# Patient Record
Sex: Female | Born: 1955 | Race: White | Hispanic: No | Marital: Married | Smoking: Current every day smoker
Health system: Southern US, Community
[De-identification: ages and names within clinical notes are randomized; demographics above are authoritative.]

## PROBLEM LIST (undated history)

## (undated) DIAGNOSIS — M719 Bursopathy, unspecified: Secondary | ICD-10-CM

## (undated) DIAGNOSIS — J439 Emphysema, unspecified: Secondary | ICD-10-CM

## (undated) DIAGNOSIS — I499 Cardiac arrhythmia, unspecified: Secondary | ICD-10-CM

## (undated) DIAGNOSIS — E669 Obesity, unspecified: Secondary | ICD-10-CM

## (undated) DIAGNOSIS — O009 Unspecified ectopic pregnancy without intrauterine pregnancy: Secondary | ICD-10-CM

## (undated) DIAGNOSIS — E0590 Thyrotoxicosis, unspecified without thyrotoxic crisis or storm: Secondary | ICD-10-CM

## (undated) DIAGNOSIS — K5732 Diverticulitis of large intestine without perforation or abscess without bleeding: Principal | ICD-10-CM

## (undated) DIAGNOSIS — E785 Hyperlipidemia, unspecified: Secondary | ICD-10-CM

## (undated) DIAGNOSIS — J449 Chronic obstructive pulmonary disease, unspecified: Secondary | ICD-10-CM

## (undated) DIAGNOSIS — M7500 Adhesive capsulitis of unspecified shoulder: Secondary | ICD-10-CM

## (undated) DIAGNOSIS — N979 Female infertility, unspecified: Secondary | ICD-10-CM

## (undated) DIAGNOSIS — M1990 Unspecified osteoarthritis, unspecified site: Secondary | ICD-10-CM

## (undated) DIAGNOSIS — E079 Disorder of thyroid, unspecified: Secondary | ICD-10-CM

## (undated) DIAGNOSIS — G4730 Sleep apnea, unspecified: Secondary | ICD-10-CM

## (undated) DIAGNOSIS — J189 Pneumonia, unspecified organism: Secondary | ICD-10-CM

## (undated) DIAGNOSIS — K602 Anal fissure, unspecified: Secondary | ICD-10-CM

## (undated) DIAGNOSIS — R7303 Prediabetes: Secondary | ICD-10-CM

## (undated) DIAGNOSIS — R1310 Dysphagia, unspecified: Secondary | ICD-10-CM

## (undated) DIAGNOSIS — K469 Unspecified abdominal hernia without obstruction or gangrene: Secondary | ICD-10-CM

## (undated) DIAGNOSIS — M2550 Pain in unspecified joint: Secondary | ICD-10-CM

## (undated) DIAGNOSIS — K5900 Constipation, unspecified: Secondary | ICD-10-CM

## (undated) DIAGNOSIS — E119 Type 2 diabetes mellitus without complications: Secondary | ICD-10-CM

## (undated) DIAGNOSIS — R011 Cardiac murmur, unspecified: Secondary | ICD-10-CM

## (undated) HISTORY — DX: Dysphagia, unspecified: R13.10

## (undated) HISTORY — DX: Adhesive capsulitis of unspecified shoulder: M75.00

## (undated) HISTORY — DX: Anal fissure, unspecified: K60.2

## (undated) HISTORY — DX: Unspecified osteoarthritis, unspecified site: M19.90

## (undated) HISTORY — DX: Type 2 diabetes mellitus without complications: E11.9

## (undated) HISTORY — DX: Sleep apnea, unspecified: G47.30

## (undated) HISTORY — DX: Emphysema, unspecified: J43.9

## (undated) HISTORY — DX: Constipation, unspecified: K59.00

## (undated) HISTORY — PX: ECTOPIC PREGNANCY SURGERY: SHX613

## (undated) HISTORY — DX: Unspecified abdominal hernia without obstruction or gangrene: K46.9

## (undated) HISTORY — PX: APPENDECTOMY: SHX54

## (undated) HISTORY — DX: Hyperlipidemia, unspecified: E78.5

## (undated) HISTORY — DX: Obesity, unspecified: E66.9

## (undated) HISTORY — DX: Female infertility, unspecified: N97.9

## (undated) HISTORY — DX: Cardiac murmur, unspecified: R01.1

## (undated) HISTORY — DX: Pain in unspecified joint: M25.50

## (undated) HISTORY — DX: Diverticulitis of large intestine without perforation or abscess without bleeding: K57.32

## (undated) HISTORY — DX: Chronic obstructive pulmonary disease, unspecified: J44.9

## (undated) HISTORY — PX: DILATION AND CURETTAGE OF UTERUS: SHX78

---
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
## 1978-10-25 HISTORY — PX: TONSILLECTOMY: SHX5217

## 1998-06-04 ENCOUNTER — Inpatient Hospital Stay (HOSPITAL_COMMUNITY): Admission: AD | Admit: 1998-06-04 | Discharge: 1998-06-04 | Payer: Self-pay | Admitting: Gynecology

## 1998-07-15 ENCOUNTER — Other Ambulatory Visit: Admission: RE | Admit: 1998-07-15 | Discharge: 1998-07-15 | Payer: Self-pay | Admitting: Gynecology

## 1998-07-27 ENCOUNTER — Ambulatory Visit (HOSPITAL_COMMUNITY): Admission: RE | Admit: 1998-07-27 | Discharge: 1998-07-27 | Payer: Self-pay | Admitting: Gynecology

## 1998-07-30 ENCOUNTER — Inpatient Hospital Stay (HOSPITAL_COMMUNITY): Admission: AD | Admit: 1998-07-30 | Discharge: 1998-07-30 | Payer: Self-pay | Admitting: Gynecology

## 1998-09-24 ENCOUNTER — Inpatient Hospital Stay (HOSPITAL_COMMUNITY): Admission: AD | Admit: 1998-09-24 | Discharge: 1998-09-24 | Payer: Self-pay | Admitting: Gynecology

## 1998-11-14 ENCOUNTER — Other Ambulatory Visit: Admission: RE | Admit: 1998-11-14 | Discharge: 1998-11-14 | Payer: Self-pay | Admitting: Gynecology

## 1998-12-17 ENCOUNTER — Inpatient Hospital Stay (HOSPITAL_COMMUNITY): Admission: AD | Admit: 1998-12-17 | Discharge: 1998-12-17 | Payer: Self-pay | Admitting: Gynecology

## 1999-02-10 ENCOUNTER — Inpatient Hospital Stay (HOSPITAL_COMMUNITY): Admission: AD | Admit: 1999-02-10 | Discharge: 1999-02-10 | Payer: Self-pay | Admitting: Gynecology

## 1999-04-05 ENCOUNTER — Inpatient Hospital Stay (HOSPITAL_COMMUNITY): Admission: AD | Admit: 1999-04-05 | Discharge: 1999-04-05 | Payer: Self-pay | Admitting: Gynecology

## 1999-08-14 ENCOUNTER — Encounter (INDEPENDENT_AMBULATORY_CARE_PROVIDER_SITE_OTHER): Payer: Self-pay | Admitting: Specialist

## 1999-08-14 ENCOUNTER — Other Ambulatory Visit: Admission: RE | Admit: 1999-08-14 | Discharge: 1999-08-14 | Payer: Self-pay | Admitting: Gynecology

## 1999-09-18 ENCOUNTER — Encounter (HOSPITAL_COMMUNITY): Admission: AD | Admit: 1999-09-18 | Discharge: 1999-12-17 | Payer: Self-pay | Admitting: Gynecology

## 1999-12-21 ENCOUNTER — Encounter (HOSPITAL_COMMUNITY): Admission: AD | Admit: 1999-12-21 | Discharge: 2000-03-20 | Payer: Self-pay | Admitting: Gynecology

## 2000-01-21 ENCOUNTER — Ambulatory Visit (HOSPITAL_COMMUNITY): Admission: RE | Admit: 2000-01-21 | Discharge: 2000-01-21 | Payer: Self-pay | Admitting: Gynecology

## 2000-01-21 ENCOUNTER — Encounter (INDEPENDENT_AMBULATORY_CARE_PROVIDER_SITE_OTHER): Payer: Self-pay | Admitting: Specialist

## 2000-07-04 ENCOUNTER — Encounter: Payer: Self-pay | Admitting: Family Medicine

## 2000-07-04 ENCOUNTER — Encounter: Admission: RE | Admit: 2000-07-04 | Discharge: 2000-07-04 | Payer: Self-pay | Admitting: Family Medicine

## 2001-07-07 ENCOUNTER — Encounter: Payer: Self-pay | Admitting: Gynecology

## 2001-07-07 ENCOUNTER — Encounter: Admission: RE | Admit: 2001-07-07 | Discharge: 2001-07-07 | Payer: Self-pay | Admitting: Gynecology

## 2002-04-11 ENCOUNTER — Other Ambulatory Visit: Admission: RE | Admit: 2002-04-11 | Discharge: 2002-04-11 | Payer: Self-pay | Admitting: Family Medicine

## 2002-06-28 ENCOUNTER — Encounter: Payer: Self-pay | Admitting: Gynecology

## 2002-06-28 ENCOUNTER — Encounter: Admission: RE | Admit: 2002-06-28 | Discharge: 2002-06-28 | Payer: Self-pay | Admitting: Gynecology

## 2002-10-10 ENCOUNTER — Inpatient Hospital Stay (HOSPITAL_COMMUNITY): Admission: EM | Admit: 2002-10-10 | Discharge: 2002-10-13 | Payer: Self-pay | Admitting: Emergency Medicine

## 2002-10-10 ENCOUNTER — Encounter (INDEPENDENT_AMBULATORY_CARE_PROVIDER_SITE_OTHER): Payer: Self-pay | Admitting: Specialist

## 2002-10-10 ENCOUNTER — Encounter: Payer: Self-pay | Admitting: Surgery

## 2003-08-09 ENCOUNTER — Encounter: Payer: Self-pay | Admitting: Gynecology

## 2003-08-09 ENCOUNTER — Encounter: Admission: RE | Admit: 2003-08-09 | Discharge: 2003-08-09 | Payer: Self-pay | Admitting: Gynecology

## 2003-10-22 ENCOUNTER — Other Ambulatory Visit: Admission: RE | Admit: 2003-10-22 | Discharge: 2003-10-22 | Payer: Self-pay | Admitting: Gynecology

## 2003-11-13 ENCOUNTER — Encounter: Admission: RE | Admit: 2003-11-13 | Discharge: 2003-11-13 | Payer: Self-pay | Admitting: Orthopedic Surgery

## 2004-08-21 ENCOUNTER — Ambulatory Visit (HOSPITAL_BASED_OUTPATIENT_CLINIC_OR_DEPARTMENT_OTHER): Admission: RE | Admit: 2004-08-21 | Discharge: 2004-08-21 | Payer: Self-pay | Admitting: Pulmonary Disease

## 2004-08-21 ENCOUNTER — Ambulatory Visit: Payer: Self-pay | Admitting: Pulmonary Disease

## 2004-09-11 ENCOUNTER — Ambulatory Visit: Payer: Self-pay | Admitting: Pulmonary Disease

## 2004-11-11 ENCOUNTER — Encounter: Admission: RE | Admit: 2004-11-11 | Discharge: 2004-11-11 | Payer: Self-pay | Admitting: Gynecology

## 2005-03-11 ENCOUNTER — Ambulatory Visit: Payer: Self-pay | Admitting: Pulmonary Disease

## 2005-10-15 ENCOUNTER — Ambulatory Visit: Payer: Self-pay | Admitting: Family Medicine

## 2005-11-02 ENCOUNTER — Ambulatory Visit: Payer: Self-pay | Admitting: Family Medicine

## 2005-11-09 ENCOUNTER — Ambulatory Visit: Payer: Self-pay | Admitting: Family Medicine

## 2005-12-03 ENCOUNTER — Encounter: Admission: RE | Admit: 2005-12-03 | Discharge: 2005-12-03 | Payer: Self-pay | Admitting: Family Medicine

## 2005-12-10 ENCOUNTER — Ambulatory Visit: Payer: Self-pay | Admitting: Gastroenterology

## 2006-01-07 ENCOUNTER — Ambulatory Visit: Payer: Self-pay | Admitting: Family Medicine

## 2006-01-17 ENCOUNTER — Ambulatory Visit: Payer: Self-pay | Admitting: Family Medicine

## 2006-12-15 ENCOUNTER — Ambulatory Visit: Payer: Self-pay | Admitting: Family Medicine

## 2007-09-04 ENCOUNTER — Ambulatory Visit: Payer: Self-pay | Admitting: Family Medicine

## 2007-09-04 DIAGNOSIS — M1990 Unspecified osteoarthritis, unspecified site: Secondary | ICD-10-CM | POA: Insufficient documentation

## 2007-09-04 DIAGNOSIS — R51 Headache: Secondary | ICD-10-CM | POA: Insufficient documentation

## 2007-09-04 DIAGNOSIS — J309 Allergic rhinitis, unspecified: Secondary | ICD-10-CM | POA: Insufficient documentation

## 2007-09-04 DIAGNOSIS — E785 Hyperlipidemia, unspecified: Secondary | ICD-10-CM | POA: Insufficient documentation

## 2007-09-04 DIAGNOSIS — J209 Acute bronchitis, unspecified: Secondary | ICD-10-CM | POA: Insufficient documentation

## 2007-09-04 DIAGNOSIS — K219 Gastro-esophageal reflux disease without esophagitis: Secondary | ICD-10-CM | POA: Insufficient documentation

## 2007-09-04 DIAGNOSIS — J342 Deviated nasal septum: Secondary | ICD-10-CM | POA: Insufficient documentation

## 2007-09-04 DIAGNOSIS — R519 Headache, unspecified: Secondary | ICD-10-CM | POA: Insufficient documentation

## 2007-09-05 ENCOUNTER — Telehealth: Payer: Self-pay | Admitting: Family Medicine

## 2007-09-20 ENCOUNTER — Telehealth: Payer: Self-pay | Admitting: Internal Medicine

## 2008-03-15 ENCOUNTER — Telehealth: Payer: Self-pay | Admitting: Family Medicine

## 2008-04-24 ENCOUNTER — Ambulatory Visit: Payer: Self-pay | Admitting: Family Medicine

## 2008-04-24 LAB — CONVERTED CEMR LAB
Blood in Urine, dipstick: NEGATIVE
Nitrite: NEGATIVE
Specific Gravity, Urine: 1.025
Urobilinogen, UA: 0.2
pH: 5

## 2008-04-25 ENCOUNTER — Telehealth: Payer: Self-pay | Admitting: Family Medicine

## 2008-04-25 LAB — CONVERTED CEMR LAB
AST: 28 units/L (ref 0–37)
Albumin: 3.7 g/dL (ref 3.5–5.2)
Alkaline Phosphatase: 57 units/L (ref 39–117)
Basophils Absolute: 0 10*3/uL (ref 0.0–0.1)
Bilirubin, Direct: 0.1 mg/dL (ref 0.0–0.3)
Chloride: 107 meq/L (ref 96–112)
Cholesterol: 191 mg/dL (ref 0–200)
Eosinophils Absolute: 0.2 10*3/uL (ref 0.0–0.7)
Eosinophils Relative: 3 % (ref 0.0–5.0)
GFR calc Af Amer: 114 mL/min
GFR calc non Af Amer: 94 mL/min
HCT: 37.6 % (ref 36.0–46.0)
HDL: 26 mg/dL — ABNORMAL LOW (ref >39.0)
MCHC: 34.7 g/dL (ref 30.0–36.0)
MCV: 93 fL (ref 78.0–100.0)
Monocytes Absolute: 0.6 10*3/uL (ref 0.1–1.0)
Neutrophils Relative %: 56.4 % (ref 43.0–77.0)
Platelets: 347 10*3/uL (ref 150–400)
Potassium: 3.9 meq/L (ref 3.5–5.1)
RDW: 14.3 % (ref 11.5–14.6)
Sodium: 141 meq/L (ref 135–145)
Total Bilirubin: 0.7 mg/dL (ref 0.3–1.2)
Triglycerides: 257 mg/dL (ref 0–149)
WBC: 8 10*3/uL (ref 4.5–10.5)

## 2008-05-01 ENCOUNTER — Ambulatory Visit: Payer: Self-pay | Admitting: Family Medicine

## 2008-05-02 ENCOUNTER — Encounter: Payer: Self-pay | Admitting: Family Medicine

## 2008-05-15 ENCOUNTER — Other Ambulatory Visit: Admission: RE | Admit: 2008-05-15 | Discharge: 2008-05-15 | Payer: Self-pay | Admitting: Gynecology

## 2008-06-12 ENCOUNTER — Ambulatory Visit: Payer: Self-pay | Admitting: Family Medicine

## 2008-06-12 DIAGNOSIS — J45909 Unspecified asthma, uncomplicated: Secondary | ICD-10-CM | POA: Insufficient documentation

## 2008-12-06 ENCOUNTER — Ambulatory Visit: Payer: Self-pay | Admitting: Family Medicine

## 2008-12-06 ENCOUNTER — Encounter: Admission: RE | Admit: 2008-12-06 | Discharge: 2008-12-06 | Payer: Self-pay | Admitting: Gynecology

## 2008-12-06 DIAGNOSIS — J019 Acute sinusitis, unspecified: Secondary | ICD-10-CM | POA: Insufficient documentation

## 2008-12-24 ENCOUNTER — Telehealth: Payer: Self-pay | Admitting: Family Medicine

## 2009-02-04 ENCOUNTER — Telehealth: Payer: Self-pay | Admitting: Family Medicine

## 2009-02-17 ENCOUNTER — Ambulatory Visit: Payer: Self-pay | Admitting: Family Medicine

## 2009-02-17 DIAGNOSIS — M722 Plantar fascial fibromatosis: Secondary | ICD-10-CM | POA: Insufficient documentation

## 2009-02-17 DIAGNOSIS — I7300 Raynaud's syndrome without gangrene: Secondary | ICD-10-CM | POA: Insufficient documentation

## 2009-09-26 ENCOUNTER — Ambulatory Visit: Payer: Self-pay | Admitting: Family Medicine

## 2009-09-29 ENCOUNTER — Telehealth: Payer: Self-pay | Admitting: Family Medicine

## 2009-10-22 ENCOUNTER — Telehealth: Payer: Self-pay | Admitting: Family Medicine

## 2009-10-28 ENCOUNTER — Ambulatory Visit: Payer: Self-pay | Admitting: Family Medicine

## 2009-12-26 ENCOUNTER — Encounter: Admission: RE | Admit: 2009-12-26 | Discharge: 2009-12-26 | Payer: Self-pay | Admitting: Gynecology

## 2010-01-27 ENCOUNTER — Ambulatory Visit: Payer: Self-pay | Admitting: Family Medicine

## 2010-08-18 ENCOUNTER — Ambulatory Visit: Payer: Self-pay | Admitting: Family Medicine

## 2010-08-18 DIAGNOSIS — K5289 Other specified noninfective gastroenteritis and colitis: Secondary | ICD-10-CM | POA: Insufficient documentation

## 2010-08-18 LAB — CONVERTED CEMR LAB
Bilirubin Urine: NEGATIVE
Glucose, Urine, Semiquant: NEGATIVE
Ketones, urine, test strip: NEGATIVE
Protein, U semiquant: 100
Specific Gravity, Urine: 1.02
Urobilinogen, UA: 0.2

## 2010-08-19 ENCOUNTER — Encounter: Payer: Self-pay | Admitting: Gastroenterology

## 2010-08-19 ENCOUNTER — Encounter: Payer: Self-pay | Admitting: Family Medicine

## 2010-08-20 ENCOUNTER — Telehealth: Payer: Self-pay | Admitting: Family Medicine

## 2010-08-26 ENCOUNTER — Telehealth (INDEPENDENT_AMBULATORY_CARE_PROVIDER_SITE_OTHER): Payer: Self-pay | Admitting: *Deleted

## 2010-09-10 ENCOUNTER — Encounter: Payer: Self-pay | Admitting: Family Medicine

## 2010-09-10 HISTORY — PX: COLONOSCOPY: SHX174

## 2010-09-21 ENCOUNTER — Ambulatory Visit: Payer: Self-pay | Admitting: Family Medicine

## 2010-10-16 ENCOUNTER — Telehealth: Payer: Self-pay | Admitting: Family Medicine

## 2010-10-20 ENCOUNTER — Ambulatory Visit: Payer: Self-pay | Admitting: Family Medicine

## 2010-11-24 NOTE — Assessment & Plan Note (Signed)
Summary: sinuses//ccm   Vital Signs:  Patient profile:   55 year old female Weight:      217 pounds O2 Sat:      97 % Temp:     99 degrees F Pulse rate:   95 / minute BP sitting:   126 / 84  (left arm) Cuff size:   large  Vitals Entered By: Pura Spice, RN (September 21, 2010 11:31 AM) CC: coughing up green sputum  sore throat  sinus draingage   History of Present Illness: Here for 3 days of sinus pressure, PND, HA, ST, and a dry cough. No fever.   Allergies: 1)  ! * Zyban  Past History:  Past Medical History: Reviewed history from 06/12/2008 and no changes required. GERD Osteoarthritis Allergic rhinitis Headache Hyperlipidemia Deviated septum obstructive sleep apnea, sees Dr. Shelle Iron sees Dr. Chevis Pretty for gyn exams elevated BP Asthma  Review of Systems  The patient denies anorexia, fever, weight loss, weight gain, vision loss, decreased hearing, hoarseness, chest pain, syncope, dyspnea on exertion, peripheral edema, hemoptysis, abdominal pain, melena, hematochezia, severe indigestion/heartburn, hematuria, incontinence, genital sores, muscle weakness, suspicious skin lesions, transient blindness, difficulty walking, depression, unusual weight change, abnormal bleeding, enlarged lymph nodes, angioedema, breast masses, and testicular masses.    Physical Exam  General:  Well-developed,well-nourished,in no acute distress; alert,appropriate and cooperative throughout examination Head:  Normocephalic and atraumatic without obvious abnormalities. No apparent alopecia or balding. Eyes:  No corneal or conjunctival inflammation noted. EOMI. Perrla. Funduscopic exam benign, without hemorrhages, exudates or papilledema. Vision grossly normal. Ears:  External ear exam shows no significant lesions or deformities.  Otoscopic examination reveals clear canals, tympanic membranes are intact bilaterally without bulging, retraction, inflammation or discharge. Hearing is grossly normal  bilaterally. Nose:  External nasal examination shows no deformity or inflammation. Nasal mucosa are pink and moist without lesions or exudates. Mouth:  Oral mucosa and oropharynx without lesions or exudates.  Teeth in good repair. Neck:  No deformities, masses, or tenderness noted. Lungs:  Normal respiratory effort, chest expands symmetrically. Lungs are clear to auscultation, no crackles or wheezes.   Impression & Recommendations:  Problem # 1:  ACUTE SINUSITIS, UNSPECIFIED (ICD-461.9)  Her updated medication list for this problem includes:    Nasonex 50 Mcg/act Susp (Mometasone furoate) .Marland Kitchen... 2 sprays each nostril two times a day    Zithromax Z-pak 250 Mg Tabs (Azithromycin) .Marland Kitchen... As directed  Complete Medication List: 1)  Proair Hfa 108 (90 Base) Mcg/act Aers (Albuterol sulfate) .... 2 puffs every 4 hours as needed sob 2)  Epipen 2-pak 0.3 Mg/0.63ml (1:1000) Devi (Epinephrine hcl (anaphylaxis)) .... As directed 3)  Metrogel 1 % Gel (Metronidazole) .... Once daily 4)  Allegra 60 Mg Tabs (Fexofenadine hcl) .... One by mouth bid 5)  Nasonex 50 Mcg/act Susp (Mometasone furoate) .... 2 sprays each nostril two times a day 6)  Zithromax Z-pak 250 Mg Tabs (Azithromycin) .... As directed  Patient Instructions: 1)  Please schedule a follow-up appointment as needed .  Prescriptions: ZITHROMAX Z-PAK 250 MG TABS (AZITHROMYCIN) as directed  #1 x 0   Entered and Authorized by:   Nelwyn Salisbury MD   Signed by:   Nelwyn Salisbury MD on 09/21/2010   Method used:   Electronically to        [facility] # [phone]* (retail)       [address]       [place], [place]  [address]  Ph: [phone]       Fax: [phone]   RxID:   [id number]    Orders Added: 1)  Est. Patient Level IV [62952]

## 2010-11-24 NOTE — Assessment & Plan Note (Signed)
Summary: intestinal discomfort/njr   Vital Signs:  Patient profile:   55 year old female Weight:      220 pounds O2 Sat:      97 % Temp:     101 degrees F Pulse rate:   110 / minute BP sitting:   130 / 80  (left arm) Cuff size:   large  Vitals Entered By: Pura Spice, RN (August 18, 2010 2:36 PM) CC: vomiting diarrhea since friday    History of Present Illness: Here for 5 days of intermittent diffuse abdominal pains, which are more concentrated on the left side. She had some nausea and vomiting for one day when this all started but none since. She has had some diarrrhea over the past 5 days. Today she developed a fever for the first time. No urinary burning. Her appetite is down but she is drinking plenty of fluids. No dark or bloody stools have been seen .  Allergies: 1)  ! * Zyban  Past History:  Past Medical History: Reviewed history from 06/12/2008 and no changes required. GERD Osteoarthritis Allergic rhinitis Headache Hyperlipidemia Deviated septum obstructive sleep apnea, sees Dr. Shelle Iron sees Dr. Chevis Pretty for gyn exams elevated BP Asthma  Past Surgical History: Reviewed history from 05/01/2008 and no changes required. Ectopic Pregnancy Carpal tunnel release Appendectomy Tonsillectomy  Review of Systems  The patient denies anorexia, weight loss, weight gain, vision loss, decreased hearing, hoarseness, chest pain, syncope, dyspnea on exertion, peripheral edema, prolonged cough, headaches, hemoptysis, melena, hematochezia, severe indigestion/heartburn, hematuria, incontinence, genital sores, muscle weakness, suspicious skin lesions, transient blindness, difficulty walking, depression, unusual weight change, abnormal bleeding, enlarged lymph nodes, angioedema, breast masses, and testicular masses.    Physical Exam  General:  Well-developed,well-nourished,in no acute distress; alert,appropriate and cooperative throughout examination Lungs:  Normal respiratory  effort, chest expands symmetrically. Lungs are clear to auscultation, no crackles or wheezes. Heart:  Normal rate and regular rhythm. S1 and S2 normal without gallop, murmur, click, rub or other extra sounds. Abdomen:  soft, normal bowel sounds, no distention, no masses, no guarding, no rigidity, no rebound tenderness, no abdominal hernia, no inguinal hernia, no hepatomegaly, and no splenomegaly.  Moderately  tender diffusely, more so on the LUQ and LLQ    Impression & Recommendations:  Problem # 1:  GASTROENTERITIS (ICD-558.9)  Orders: UA Dipstick w/o Micro (manual) (69629) T-Culture, Urine ([address]41-32440) Gastroenterology Referral (GI)  Complete Medication List: 1)  Proair Hfa 108 (90 Base) Mcg/act Aers (Albuterol sulfate) .... 2 puffs every 4 hours as needed sob 2)  Epipen 2-pak 0.3 Mg/0.41ml (1:1000) Devi (Epinephrine hcl (anaphylaxis)) .... As directed 3)  Metrogel 1 % Gel (Metronidazole) .... Once daily 4)  Allegra 60 Mg Tabs (Fexofenadine hcl) .... One by mouth bid 5)  Nasonex 50 Mcg/act Susp (Mometasone furoate) .... 2 sprays each nostril two times a day 6)  Ciprofloxacin Hcl 500 Mg Tabs (Ciprofloxacin hcl) .... Two times a day  Patient Instructions: 1)  culture the urine. She was to have had a colonoscopy 2 years ago, but she did not follow through. We will set this up again Prescriptions: CIPROFLOXACIN HCL 500 MG TABS (CIPROFLOXACIN HCL) two times a day  #20 x 0   Entered and Authorized by:   Nelwyn Salisbury MD   Signed by:   Nelwyn Salisbury MD on 08/18/2010   Method used:   Electronically to        [facility] # [phone]* (retail)  [address]       [place], [place]  [address]       Ph: [phone]       Fax: [phone]   RxID:   [id number]    Orders Added: 1)  Est. Patient Level IV [59563] 2)  UA Dipstick w/o Micro (manual) [81002] 3)  T-Culture, Urine [87564-33295] 4)  Gastroenterology Referral [GI]    Laboratory Results   Urine  Tests  Date/Time Received: August 18, 2010 2:58 PM  Date/Time Reported: 2:58 PM   Routine Urinalysis   Color: yellow Appearance: Clear Glucose: negative   (Normal Range: Negative) Bilirubin: negative   (Normal Range: Negative) Ketone: negative   (Normal Range: Negative) Spec. Gravity: 1.020   (Normal Range: 1.003-1.035) Blood: moderate   (Normal Range: Negative) pH: 6.0   (Normal Range: 5.0-8.0) Protein: 100   (Normal Range: Negative) Urobilinogen: 0.2   (Normal Range: 0-1) Nitrite: negative   (Normal Range: Negative) Leukocyte Esterace: negative   (Normal Range: Negative)    Comments: Pura Spice, RN  August 18, 2010 2:59 PM

## 2010-11-24 NOTE — Assessment & Plan Note (Signed)
Summary: SINUSITIS // RS   Vital Signs:  Patient profile:   55 year old female Weight:      225 pounds Temp:     98.2 degrees F oral BP sitting:   122 / 78  (left arm) Cuff size:   large  Vitals Entered By: Duard Brady LPN (January 27, 253 11:34 AM) CC: c/o sinus issues, chest and head congestion, treavelling this weekend. Is Patient Diabetic? No   History of Present Illness: For one week she has had sinus pressure, HA, PND, ST, and a dry cough. No fever.   Preventive Screening-Counseling & Management  Alcohol-Tobacco     Smoking Status: current  Allergies: 1)  ! * Zyban  Past History:  Past Medical History: Reviewed history from 06/12/2008 and no changes required. GERD Osteoarthritis Allergic rhinitis Headache Hyperlipidemia Deviated septum obstructive sleep apnea, sees Dr. Shelle Iron sees Dr. Chevis Pretty for gyn exams elevated BP Asthma  Review of Systems  The patient denies anorexia, fever, weight loss, weight gain, vision loss, decreased hearing, hoarseness, chest pain, syncope, dyspnea on exertion, peripheral edema, hemoptysis, abdominal pain, melena, hematochezia, severe indigestion/heartburn, hematuria, incontinence, genital sores, muscle weakness, suspicious skin lesions, transient blindness, difficulty walking, depression, unusual weight change, abnormal bleeding, enlarged lymph nodes, angioedema, breast masses, and testicular masses.    Physical Exam  General:  Well-developed,well-nourished,in no acute distress; alert,appropriate and cooperative throughout examination Head:  Normocephalic and atraumatic without obvious abnormalities. No apparent alopecia or balding. Eyes:  No corneal or conjunctival inflammation noted. EOMI. Perrla. Funduscopic exam benign, without hemorrhages, exudates or papilledema. Vision grossly normal. Ears:  External ear exam shows no significant lesions or deformities.  Otoscopic examination reveals clear canals, tympanic membranes are  intact bilaterally without bulging, retraction, inflammation or discharge. Hearing is grossly normal bilaterally. Nose:  External nasal examination shows no deformity or inflammation. Nasal mucosa are pink and moist without lesions or exudates. Mouth:  Oral mucosa and oropharynx without lesions or exudates.  Teeth in good repair. Neck:  No deformities, masses, or tenderness noted. Lungs:  Normal respiratory effort, chest expands symmetrically. Lungs are clear to auscultation, no crackles or wheezes.   Impression & Recommendations:  Problem # 1:  ACUTE SINUSITIS, UNSPECIFIED (ICD-461.9)  Her updated medication list for this problem includes:    Nasonex 50 Mcg/act Susp (Mometasone furoate) .Marland Kitchen... 2 sprays each nostril two times a day    Augmentin 875-125 Mg Tabs (Amoxicillin-pot clavulanate) .Marland Kitchen..Marland Kitchen Two times a day  Complete Medication List: 1)  Proair Hfa 108 (90 Base) Mcg/act Aers (Albuterol sulfate) .... 2 puffs every 4 hours as needed sob 2)  Epipen 2-pak 0.3 Mg/0.48ml (1:1000) Devi (Epinephrine hcl (anaphylaxis)) .... As directed 3)  Metrogel 1 % Gel (Metronidazole) .... Once daily 4)  Allegra 60 Mg Tabs (Fexofenadine hcl) .... One by mouth bid 5)  Nasonex 50 Mcg/act Susp (Mometasone furoate) .... 2 sprays each nostril two times a day 6)  Augmentin 875-125 Mg Tabs (Amoxicillin-pot clavulanate) .... Two times a day  Patient Instructions: 1)  Please schedule a follow-up appointment as needed .  Prescriptions: AUGMENTIN 875-125 MG TABS (AMOXICILLIN-POT CLAVULANATE) two times a day  #28 x 0   Entered and Authorized by:   Nelwyn Salisbury MD   Signed by:   Nelwyn Salisbury MD on 01/27/2010   Method used:   Electronically to        [facility] # [phone]* (retail)       [phone]  [place], [place]  [address]       Ph: [phone]       Fax: [phone]   RxID:   [id number]

## 2010-11-24 NOTE — Progress Notes (Signed)
Summary: REQUEST FOR RETURN CALL  Phone Note Call from Patient   Caller: Patient  [phone] Summary of Call: Pt called to speak with Dr Clent [place] or Almira Coaster, RN.... Pt adv that she had dx studies done recently and she wanted to speak with someone about same.... Pt is still exp sxs of abdominal pain, n/v has subsided but she is now exp constipation.... Pt can be reached at [phone] to discuss same.  Initial call taken by: Debbra Riding,  August 20, 2010 4:28 PM  Follow-up for Phone Call        called to say she is better but  states passage of stool painful  appt dr stark nov30 .  continues on  cipro .diet mainly mashed potatoes saltines  states has an appetite but afraid to eat anything due  painful stool. dont' think she can wait til nov 30 to see Dr Russella Dar.   Dr Ardell Isaacs office notified. spoke to scheduler and was told  no sooner appt avail but pt could call their office for cancellations. Dr Clent Ridges aware and instructed to remain on "BRAT " diet. pt informed.  Follow-up by: Pura Spice, RN,  August 21, 2010 8:51 AM

## 2010-11-24 NOTE — Letter (Signed)
Summary: New Patient letter  [facility]enter[facility]  [address] [place], [place] [address]   Phone: [phone]  Fax: [phone]       08/19/2010 MRN: [id number]  [facility] [address] [place], [place]  [address]  Dear Alison Best,  Welcome to the Gastroenterology Division at [facility].    You are scheduled to see Dr.  Russella Dar on 09-23-10 at 9:30am on the 3rd floor at [facility], [address]  We ask that you try to arrive at our office 15 minutes prior to your appointment time to allow for check-in.  We would like you to complete the enclosed self-administered evaluation form prior to your visit and bring it with you on the day of your appointment.  We will review it with you.  Also, please bring a complete list of all your medications or, if you prefer, bring the medication bottles and we will list them.  Please bring your insurance card so that we may make a copy of it.  If your insurance requires a referral to see a specialist, please bring your referral form from your primary care physician.  Co-payments are due at the time of your visit and may be paid by cash, check or credit card.     Your office visit will consist of a consult with your physician (includes a physical exam), any laboratory testing he/she may order, scheduling of any necessary diagnostic testing (e.g. x-ray, ultrasound, CT-scan), and scheduling of a procedure (e.g. Endoscopy, Colonoscopy) if required.  Please allow enough time on your schedule to allow for any/all of these possibilities.    If you cannot keep your appointment, please call [phone] to cancel or reschedule prior to your appointment date.  This allows [name] the opportunity to schedule an appointment for another patient in need of care.  If you do not cancel or reschedule by 5 p.m. the business day prior to your appointment date, you will be charged a $50.00 late cancellation/no-show fee.    Thank you for  choosing  Gastroenterology for your medical needs.  We appreciate the opportunity to care for you.  Please visit [name] at our website  to learn more about our practice.                     Sincerely,                                                             The Gastroenterology Division

## 2010-11-24 NOTE — Progress Notes (Signed)
Summary: Sent last note and labs to [facility] GI  Phone Note Call from Patient   Caller: Patient Call For: Nelwyn Salisbury MD Action Taken: Appt Scheduled Today Summary of Call: Pt is still having abdominal pain and has called everyday but no opening........Just does not know what to do.....Marland KitchenMarland KitchenIs seeing another appt with [facility] this week and needs records.  Call sent to Medical Records.  Appt not until 09/23/2010. Initial call taken by: Lynann Beaver CMA AAMA,  August 26, 2010 4:25 PM  Follow-up for Phone Call        Phone Call Completed,sent last ov note and labs to [facility] GI. Follow-up by: Drue Stager,  August 27, 2010 8:33 AM

## 2010-11-24 NOTE — Assessment & Plan Note (Signed)
Summary: continued productive cough/sob/dm   Vital Signs:  Patient profile:   55 year old female Weight:      221 pounds O2 Sat:      96 % on Room air Temp:     98.8 degrees F oral Pulse rate:   108 / minute BP sitting:   112 / 74  (left arm) Cuff size:   large  Vitals Entered By: Alfred Levins, CMA (October 28, 2009 3:35 PM)  O2 Flow:  Room air CC: cough, wheezing, congestion   History of Present Illness: Here for continued chest congestion and coughing up green sputum for the past month, despite taking azithromycin and Levaquin. She continues with fluids and Mucinex. No fever.   Current Medications (verified): 1)  Proair Hfa 108 (90 Base) Mcg/act  Aers (Albuterol Sulfate) .... 2 Puffs Every 4 Hours As Needed Sob 2)  Epipen 2-Pak 0.3 Mg/0.66ml (1:1000)  Devi (Epinephrine Hcl (Anaphylaxis)) .... As Directed 3)  Metrogel 1 % Gel (Metronidazole) .... Once Daily 4)  Allegra 60 Mg Tabs (Fexofenadine Hcl) .... One By Mouth Bid  Allergies (verified): 1)  ! * Zyban  Past History:  Past Medical History: Reviewed history from 06/12/2008 and no changes required. GERD Osteoarthritis Allergic rhinitis Headache Hyperlipidemia Deviated septum obstructive sleep apnea, sees Dr. Shelle Iron sees Dr. Chevis Pretty for gyn exams elevated BP Asthma  Past Surgical History: Reviewed history from 05/01/2008 and no changes required. Ectopic Pregnancy Carpal tunnel release Appendectomy Tonsillectomy  Review of Systems  The patient denies anorexia, fever, weight loss, weight gain, vision loss, decreased hearing, hoarseness, chest pain, syncope, dyspnea on exertion, peripheral edema, headaches, hemoptysis, abdominal pain, melena, hematochezia, severe indigestion/heartburn, hematuria, incontinence, genital sores, muscle weakness, suspicious skin lesions, transient blindness, difficulty walking, depression, unusual weight change, abnormal bleeding, enlarged lymph nodes, angioedema, breast masses, and  testicular masses.    Physical Exam  General:  wheezing and coughing, alert Head:  Normocephalic and atraumatic without obvious abnormalities. No apparent alopecia or balding. Eyes:  No corneal or conjunctival inflammation noted. EOMI. Perrla. Funduscopic exam benign, without hemorrhages, exudates or papilledema. Vision grossly normal. Ears:  External ear exam shows no significant lesions or deformities.  Otoscopic examination reveals clear canals, tympanic membranes are intact bilaterally without bulging, retraction, inflammation or discharge. Hearing is grossly normal bilaterally. Nose:  External nasal examination shows no deformity or inflammation. Nasal mucosa are pink and moist without lesions or exudates. Mouth:  Oral mucosa and oropharynx without lesions or exudates.  Teeth in good repair. Neck:  No deformities, masses, or tenderness noted. Lungs:  scattered rhonchi and wheezes, no rales   Impression & Recommendations:  Problem # 1:  ACUTE BRONCHITIS (ICD-466.0)  Her updated medication list for this problem includes:    Proair Hfa 108 (90 Base) Mcg/act Aers (Albuterol sulfate) .Marland Kitchen... 2 puffs every 4 hours as needed sob    Augmentin 875-125 Mg Tabs (Amoxicillin-pot clavulanate) .Marland Kitchen..Marland Kitchen Two times a day  Orders: Nebulizer Tx ([address])  Complete Medication List: 1)  Proair Hfa 108 (90 Base) Mcg/act Aers (Albuterol sulfate) .... 2 puffs every 4 hours as needed sob 2)  Epipen 2-pak 0.3 Mg/0.95ml (1:1000) Devi (Epinephrine hcl (anaphylaxis)) .... As directed 3)  Metrogel 1 % Gel (Metronidazole) .... Once daily 4)  Allegra 60 Mg Tabs (Fexofenadine hcl) .... One by mouth bid 5)  Augmentin 875-125 Mg Tabs (Amoxicillin-pot clavulanate) .... Two times a day 6)  Prednisone (pak) 10 Mg Tabs (Prednisone) .... As directed for 12 days 7)  Nasonex  50 Mcg/act Susp (Mometasone furoate) .... 2 sprays each nostril two times a day  Patient Instructions: 1)  Please schedule a follow-up appointment as  needed .  Prescriptions: NASONEX 50 MCG/ACT SUSP (MOMETASONE FUROATE) 2 sprays each nostril two times a day  #30 x 11   Entered and Authorized by:   Nelwyn Salisbury MD   Signed by:   Nelwyn Salisbury MD on 10/28/2009   Method used:   Electronically to        [facility] # [phone]* (retail)       [address]       [place], [place]  [address]       Ph: [phone]       Fax: [phone]   RxID:   [id number] ALLEGRA 60 MG TABS (FEXOFENADINE HCL) one by mouth bid  #60 x 11   Entered and Authorized by:   Nelwyn Salisbury MD   Signed by:   Nelwyn Salisbury MD on 10/28/2009   Method used:   Electronically to        [facility] # [phone]* (retail)       [address]       [place], [place]  [address]       Ph: [phone]       Fax: [phone]   RxID:   [id number] NASONEX 50 MCG/ACT SUSP (MOMETASONE FUROATE) 2 sprays each nostril two times a day  #60 x 0   Entered and Authorized by:   Nelwyn Salisbury MD   Signed by:   Nelwyn Salisbury MD on 10/28/2009   Method used:   Electronically to        [facility] # [phone]* (retail)       [address]       [place], [place]  [address]       Ph: [phone]       Fax: [phone]   RxID:   [id number] PREDNISONE (PAK) 10 MG TABS (PREDNISONE) as directed for 12 days  #1 x 0   Entered and Authorized by:   Nelwyn Salisbury MD   Signed by:   Nelwyn Salisbury MD on 10/28/2009   Method used:   Electronically to        [facility] # [phone]* (retail)       [address]       [place], [place]  [address]       Ph: [phone]       Fax: [phone]   RxID:   [id number] AUGMENTIN 875-125 MG TABS (AMOXICILLIN-POT CLAVULANATE) two times a day  #20 x 0   Entered and Authorized by:   Nelwyn Salisbury MD   Signed by:   Nelwyn Salisbury MD on 10/28/2009   Method used:   Electronically to        [facility] # [phone]* (retail)       [address]       [place], [place]  [address]       Ph: [phone]       Fax: [phone]    RxID:   [id number]

## 2010-11-26 NOTE — Procedures (Signed)
Summary: Colonoscopy Report/[facility]  Colonoscopy Report/[facility]   Imported By: Maryln Gottron 10/09/2010 12:55:29  _____________________________________________________________________  External Attachment:    Type:   Image     Comment:   External Document

## 2010-11-26 NOTE — Assessment & Plan Note (Signed)
Summary: COUGH, CONGESTION // RS   Vital Signs:  Patient profile:   55 year old female Temp:     98.3 degrees F oral BP sitting:   112 / 70  (right arm) Cuff size:   large  Vitals Entered By: Sid Falcon LPN (October 20, 2010 10:14 AM)   History of Present Illness: Here for one week of PND, chest congestion, and coughing up green sputum. No fever. She took a Zpack about 6 weeks ago for a sinus infection. On Mucinex.   Allergies: 1)  ! * Zyban  Past History:  Past Medical History: Reviewed history from 06/12/2008 and no changes required. GERD Osteoarthritis Allergic rhinitis Headache Hyperlipidemia Deviated septum obstructive sleep apnea, sees Dr. Shelle Iron sees Dr. Chevis Pretty for gyn exams elevated BP Asthma  Review of Systems  The patient denies anorexia, fever, weight loss, weight gain, vision loss, decreased hearing, hoarseness, chest pain, syncope, dyspnea on exertion, peripheral edema, headaches, hemoptysis, abdominal pain, melena, hematochezia, severe indigestion/heartburn, hematuria, incontinence, genital sores, muscle weakness, suspicious skin lesions, transient blindness, difficulty walking, depression, unusual weight change, abnormal bleeding, enlarged lymph nodes, angioedema, breast masses, and testicular masses.    Physical Exam  General:  Well-developed,well-nourished,in no acute distress; alert,appropriate and cooperative throughout examination Head:  Normocephalic and atraumatic without obvious abnormalities. No apparent alopecia or balding. Eyes:  No corneal or conjunctival inflammation noted. EOMI. Perrla. Funduscopic exam benign, without hemorrhages, exudates or papilledema. Vision grossly normal. Ears:  External ear exam shows no significant lesions or deformities.  Otoscopic examination reveals clear canals, tympanic membranes are intact bilaterally without bulging, retraction, inflammation or discharge. Hearing is grossly normal bilaterally. Nose:  External  nasal examination shows no deformity or inflammation. Nasal mucosa are pink and moist without lesions or exudates. Mouth:  Oral mucosa and oropharynx without lesions or exudates.  Teeth in good repair. Neck:  No deformities, masses, or tenderness noted. Lungs:  scattered rhonchi   Impression & Recommendations:  Problem # 1:  ACUTE BRONCHITIS (ICD-466.0)  Her updated medication list for this problem includes:    Proair Hfa 108 (90 Base) Mcg/act Aers (Albuterol sulfate) .Marland Kitchen... 2 puffs every 4 hours as needed sob    Levaquin 500 Mg Tabs (Levofloxacin) ..... Once daily  Complete Medication List: 1)  Proair Hfa 108 (90 Base) Mcg/act Aers (Albuterol sulfate) .... 2 puffs every 4 hours as needed sob 2)  Epipen 2-pak 0.3 Mg/0.64ml (1:1000) Devi (Epinephrine hcl (anaphylaxis)) .... As directed 3)  Metrogel 1 % Gel (Metronidazole) .... Once daily 4)  Allegra 60 Mg Tabs (Fexofenadine hcl) .... One by mouth bid 5)  Nasonex 50 Mcg/act Susp (Mometasone furoate) .... 2 sprays each nostril two times a day 6)  Levaquin 500 Mg Tabs (Levofloxacin) .... Once daily  Patient Instructions: 1)  Please schedule a follow-up appointment as needed .  Prescriptions: LEVAQUIN 500 MG TABS (LEVOFLOXACIN) once daily  #10 x 0   Entered and Authorized by:   Nelwyn Salisbury MD   Signed by:   Nelwyn Salisbury MD on 10/20/2010   Method used:   Electronically to        [facility] # [phone]* (retail)       [address]       [place], [place]  [address]       Ph: [phone]       Fax: [phone]   RxID:   [id number]    Orders Added: 1)  Est. Patient Level IV [36644]

## 2010-11-26 NOTE — Progress Notes (Signed)
Summary: REQUEST FOR NEW PT APPT FOR [place]  Phone Note Call from Patient   Caller: Patient Summary of Call: Wants to know if Dr Clent Ridges will accept her brother Debria Garret, DOB: [phone]..... [employer]) as a new pt... .would like appt this coming week (Dec. 26 - Dec. 30)  because he has been exp sxs of cough, congestion.... Can her brother be worked into schedule next week for a new pt appt?    #  [phone].  Initial call taken by: Debbra Riding,  October 16, 2010 2:49 PM  Follow-up for Phone Call        he would need to see Urgent Care this week. Aqsa is aware  Follow-up by: Nelwyn Salisbury MD,  October 20, 2010 10:41 AM

## 2010-12-28 ENCOUNTER — Telehealth: Payer: Self-pay | Admitting: *Deleted

## 2010-12-28 NOTE — Telephone Encounter (Signed)
Pt is going to [place] and would like to take an antibiotic such as Cipro for Bronchitis or anything else she may be exposed to.

## 2010-12-28 NOTE — Telephone Encounter (Signed)
Call in Cipro 500 mg bid as needed, #30 with no rf

## 2010-12-29 MED ORDER — CIPROFLOXACIN HCL 500 MG PO TABS: 500.0000 mg | ORAL_TABLET | Freq: Two times a day (BID) | ORAL | Status: AC

## 2010-12-29 NOTE — Telephone Encounter (Signed)
Addended by: Kern Reap on: 12/29/2010 05:20 PM   Modules accepted: Orders

## 2010-12-30 ENCOUNTER — Telehealth: Payer: Self-pay | Admitting: *Deleted

## 2010-12-30 MED ORDER — CIPROFLOXACIN HCL 500 MG PO TABS: 500.0000 mg | ORAL_TABLET | Freq: Two times a day (BID) | ORAL | Status: AC

## 2010-12-30 NOTE — Telephone Encounter (Signed)
Cipro to take to [place].

## 2011-01-05 ENCOUNTER — Other Ambulatory Visit: Payer: Self-pay | Admitting: Gynecology

## 2011-01-20 ENCOUNTER — Encounter: Payer: Self-pay | Admitting: Family Medicine

## 2011-01-20 ENCOUNTER — Telehealth: Payer: Self-pay | Admitting: Family Medicine

## 2011-01-20 ENCOUNTER — Ambulatory Visit (INDEPENDENT_AMBULATORY_CARE_PROVIDER_SITE_OTHER): Payer: BC Managed Care – PPO | Admitting: Family Medicine

## 2011-01-20 VITALS — BP 124/82 | HR 79 | Temp 98.0°F | Wt 219.0 lb

## 2011-01-20 DIAGNOSIS — S90129A Contusion of unspecified lesser toe(s) without damage to nail, initial encounter: Secondary | ICD-10-CM

## 2011-01-20 DIAGNOSIS — R609 Edema, unspecified: Secondary | ICD-10-CM

## 2011-01-20 DIAGNOSIS — Z23 Encounter for immunization: Secondary | ICD-10-CM

## 2011-01-20 DIAGNOSIS — R600 Localized edema: Secondary | ICD-10-CM

## 2011-01-20 MED ORDER — FUROSEMIDE 20 MG PO TABS: 20.0000 mg | ORAL_TABLET | Freq: Every day | ORAL | Status: DC | PRN
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
## 2011-01-20 NOTE — Telephone Encounter (Signed)
Called pt and sch for ov today, 01/20/11 at 2pm, as noted.

## 2011-01-20 NOTE — Telephone Encounter (Signed)
Pt just came in from trip to [place]. Pt cut her foot on a rusty door and is req to come in for tetanus shot asap. Pt can not remember date of last tetanus. Pts ankles and calves are swollen. Pls advise.

## 2011-01-20 NOTE — Progress Notes (Signed)
Addended byMadison Hickman on: 01/20/2011 03:07 PM   Modules accepted: Orders

## 2011-01-20 NOTE — Telephone Encounter (Signed)
Ok per dr fry needs office visit

## 2011-01-26 ENCOUNTER — Telehealth: Payer: Self-pay | Admitting: Family Medicine

## 2011-01-26 NOTE — Telephone Encounter (Signed)
Pt called and says sinus inf/bronchitus. Pt coughing and has thick chest congestion. Pt is req antibiotic like Dr Clent Ridges gave her before. Pt req this be called in to [facility] at [address]. Pt said that she would sch ov if necessary. Pls advise.

## 2011-01-27 ENCOUNTER — Encounter: Payer: Self-pay | Admitting: Family Medicine

## 2011-01-27 ENCOUNTER — Ambulatory Visit (INDEPENDENT_AMBULATORY_CARE_PROVIDER_SITE_OTHER): Payer: BC Managed Care – PPO | Admitting: Family Medicine

## 2011-01-27 VITALS — BP 116/80 | HR 100 | Temp 98.5°F | Wt 215.0 lb

## 2011-01-27 DIAGNOSIS — J40 Bronchitis, not specified as acute or chronic: Secondary | ICD-10-CM

## 2011-01-27 MED ORDER — LEVOFLOXACIN 500 MG PO TABS: 500.0000 mg | ORAL_TABLET | Freq: Every day | ORAL | Status: AC

## 2011-01-27 NOTE — Telephone Encounter (Signed)
Called pt and sch ov with Dr Clent Ridges for today 01/27/11 at 4pm as noted.

## 2011-01-27 NOTE — Telephone Encounter (Signed)
She needs an OV 

## 2011-01-28 ENCOUNTER — Encounter: Payer: Self-pay | Admitting: Family Medicine

## 2011-01-28 NOTE — Progress Notes (Signed)
  Subjective:    Patient ID: Alison Best, female    DOB: 1956/09/21, 55 y.o.   MRN: [id number]  HPI Here for one week of sinus pressure, PND, ST, and coughing up green sputum. No fever. On Mucinex.   Review of Systems  Constitutional: Negative.   HENT: Positive for congestion and sinus pressure.   Eyes: Negative.   Respiratory: Positive for cough.        Objective:   Physical Exam  Constitutional: She appears well-developed and well-nourished.  HENT:  Head: Normocephalic and atraumatic.  Right Ear: External ear normal.  Left Ear: External ear normal.  Nose: Nose normal.  Mouth/Throat: No oropharyngeal exudate.  Eyes: Conjunctivae are normal. Pupils are equal, round, and reactive to light.  Neck: Normal range of motion. Neck supple.  Pulmonary/Chest: Effort normal and breath sounds normal. No respiratory distress. She has no wheezes. She has no rales. She exhibits no tenderness.  Lymphadenopathy:    She has no cervical adenopathy.          Assessment & Plan:  Rest , fluids

## 2011-03-09 NOTE — Assessment & Plan Note (Signed)
[facility]                                 ON-CALL NOTE   KRISALYN, YANKOWSKI                    MRN:          [id number]  DATE:04/20/2008                            DOB:          1956-06-10    DATE OF INTERACTION:  April 20, 2008, at 8:47 a.m.   PHONE NUMBER:  [phone].   OBJECTIVE:  The patient has a tick, which she discovered last night,  probably was on her more than 20 hours, maybe as long as 30 hours, was  in a park up here in [name], [place].  She has a history of [facility] spotted fever in the past about 20 years ago.  I told her that  the tick was indeed on her long enough to cause problems, we would look  for rash, joint aches, pains, or fever.  If she gets any of those  symptoms, to call and get treated quickly; otherwise, probably will do  fine.   Primary care Inaya Gillham is Dr. Clent Ridges and home office is home office is  [facility].     Arta Silence, MD  Electronically Signed    RNS/MedQ  DD: 04/20/2008  DT: 04/21/2008  Job #: [id number]

## 2011-03-12 NOTE — Op Note (Signed)
NAME:  Alison Best, Alison Best                       ACCOUNT NO.:  [id number]   MEDICAL RECORD NO.:  [id number]                   PATIENT TYPE:  INP   LOCATION:  0102                                 FACILITY:  [facility]   PHYSICIAN:  Currie Paris, M.D.           DATE OF BIRTH:  Sep 05, 1956   DATE OF PROCEDURE:  10/10/2002  DATE OF DISCHARGE:                                 OPERATIVE REPORT   PREOPERATIVE DIAGNOSIS:  Acute appendicitis.   POSTOPERATIVE DIAGNOSIS:  Acute appendicitis.   OPERATION:  Laparoscopic appendectomy.   SURGEON:  Currie Paris, M.D.   ANESTHESIA:  General endotracheal.   CLINICAL HISTORY:  This patient is a 55 year old, who presents with nausea,  diarrhea, and abdominal pain which has gotten worse.  Her white count was  23,000, and a CT confirmed appendicitis.   DESCRIPTION OF PROCEDURE:  The patient was seen in the holding area and had  no further questions.  She was taken to the operating room and after  satisfactory general endotracheal anesthesia had been obtained, the abdomen  was prepped and draped and a Foley catheter placed.   Marcaine 0.25% was used for each incision and the umbilical incision made  first.  The fascia was opened, the peritoneal cavity entered in the midline  under direct vision and a pursestring placed.  The Hasson was introduced and  the abdomen insufflated to 15.   With the camera in place, I could see some purulent fluid in the right lower  quadrant.  I put a 5 mm trocar in the right upper quadrant and a 10-11 in  the left lower quadrant, angling this obliquely across the abdominal wall.  With the camera in the left lower quadrant, we started to work around the  appendix, but I had the entry into the abdominal cavity from the left lower  quadrant portal too far to the right to get good visualization, so I backed  it out and reintroduced a little bit more towards the left of the midline.  With the camera in that position,  we got good visualization.  The appendix  was necrotic almost to the base and as it was grasped, a little fecalith  came out which I suctioned up.   Using the harmonic scalpel, I was able to divide the mesoappendix down to  the base and then divide the base of the appendix with the EndoGIA 3.5  staples.  The appendix was put into a bag and brought out the umbilical port  and the Hasson reintroduced and the abdomen reinsufflated.  I irrigated  copiously, tried to irrigate out the pelvis and got all of the purulent  fluid out that was present and got a good look to make sure there was no  bleeding around the appendiceal area, and it appeared to be dry.  I did  notice some blood running down from the left lower quadrant port site and  when  I removed it, there was some bleeding, but it was not very vigorous.  I  initially attempted an Endoclose but could not get good closure through that  so made the skin incision just a little bit larger and with direct  visualization of the fascia, was able to put a pursestring figure-of-eight 0  Vicryl through.  We kept an eye on this, and there was no bleeding either  externally or internally for over about the next 10 minutes and, I, at that  point, assumed that bleeding was controlled and that it was dry.  While we  were waiting, I continued irrigation to be sure we had the pelvis as fully  irrigated out as possible.   Once everything appeared to be dry, I removed the right upper quadrant port.  The abdomen was deflated through the umbilicus and the fascia closed with a  pursestring.  Skin was closed with 4-0 Monocryl and Steri-Strips.   The patient tolerated the procedure well.  There were no operative  complications, and all counts were correct.                                               Currie Paris, M.D.    CJS/MEDQ  D:  10/10/2002  T:  10/11/2002  Job:  [id number]   cc:   Duncan Dull, M.D.  [address]  [place]   [place] [address]  Fax: [phone]

## 2011-03-12 NOTE — Procedures (Signed)
NAME:  Alison Best, Alison Best NO.:  [id number]   MEDICAL RECORD NO.:  [id number]          PATIENT TYPE:  OUT   LOCATION:  SLEEP CENTER                 FACILITY:  [facility]   PHYSICIAN:  Marcelyn Bruins, M.D. [facility] DATE OF BIRTH:  Mar 03, 1956   DATE OF STUDY:  08/21/2004                              NOCTURNAL POLYSOMNOGRAM   REFERRING PHYSICIAN:  Marcelyn Bruins, M.D. [facility]   INDICATIONS FOR STUDY:  Hypersomnia with sleep apnea. Epworth sleepiness  score is 8.   SLEEP ARCHITECTURE:  The patient had a total sleep time of 375 minutes with  sleep efficiency of 79%. REM and slow wave sleep were present, but decreased  in quantity. Sleep onset latency was prolonged at 52 minutes and REM latency  was normal.   IMPRESSION/RECOMMENDATIONS:  1.  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 Even      though the patient had smaller numbers of events, they were quite      significant and resulted in severe O2 desaturation. Moderate to severe      snoring was noted associated with the events.  2.  Occasional premature ventricular contractions with occasional premature      auricular contractions.  3.  Small to moderate numbers of leg jerks with significant sleep      disruption. Clinical correlation is suggested after the patient has been      appropriately treated for the obstructive sleep apnea.      KC/MEDQ  D:  09/04/2004 12:48:49  T:  09/04/2004 22:08:33  Job:  [id number]

## 2011-03-12 NOTE — Discharge Summary (Signed)
   NAME:  Alison Best, Alison Best                       ACCOUNT NO.:  [id number]   MEDICAL RECORD NO.:  [id number]                   PATIENT TYPE:  INP   LOCATION:  1610                                 FACILITY:  [facility]   PHYSICIAN:  Currie Paris, M.D.           DATE OF BIRTH:  01-10-1956   DATE OF ADMISSION:  10/10/2002  DATE OF DISCHARGE:  10/13/2002                                 DISCHARGE SUMMARY   FINAL DIAGNOSIS:  Acute suppurative appendicitis.   HISTORY OF PRESENT ILLNESS:  The patient has presented to the emergency room  with some abdominal pain, nausea and vomiting, and diarrhea.  A CT scan was  consistent with appendicitis.  White count had been noted to be 22,000.   HOSPITAL COURSE:  The patient was seen in the emergency room and evaluated,  and labs and x-ray reviewed.  She was then taken to the operating room, and  laparoscopic appendectomy performed which she tolerated nicely.  She did  well postoperatively, although ran some fever for a day or two.  This  cleared up.  Her white blood cell count by 10/12/02, had come down to 8000,  and we continued her antibiotics.  By 10/13/02, she was doing well, had a  bowel movement, her abdomen was soft, wounds were healing nicely, and she  was able to be discharged.   DISCHARGE MEDICATIONS:  1. Augmentin.  2. Percocet.   Pathology confirmed acute appendicitis.   FOLLOWUP:  She will be followed up in my office in approximately two weeks.                                               Currie Paris, M.D.    CJS/MEDQ  D:  10/20/2002  T:  10/20/2002  Job:  [id number]   cc:   Duncan Dull, M.D.  [address]  [place]  [place] [address]  Fax: [phone]

## 2011-03-12 NOTE — H&P (Signed)
NAME:  Alison Best, Alison Best                       ACCOUNT NO.:  [id number]   MEDICAL RECORD NO.:  [id number]                   PATIENT TYPE:  INP   LOCATION:  9629                                 FACILITY:  [facility]   PHYSICIAN:  Currie Paris, M.D.           DATE OF BIRTH:  09-Dec-1955   DATE OF ADMISSION:  10/10/2002  DATE OF DISCHARGE:                                HISTORY & PHYSICAL   CHIEF COMPLAINT:  Abdominal pain.   HISTORY OF PRESENT ILLNESS:  The patient is a generally healthy 55 year old  who was in her usual state of good health until yesterday when she ate  something citrusy which left her with a very bad taste in her mouth. She has  had something like this once before and got sick after it and it tasted  somewhat like that, but she is not really sure that was the source of  her  problems. Nevertheless sometime after she noticed that she started having  some cramping abdominal pain and it felt like her stomach was doing flip  flops.  This was followed by a feeling of she was going to have some  diarrhea and nausea and vomiting. This all began last evening and lasted  through the night with several loose stools, nausea and vomiting and still  consistent abdominal pain  which has become more lower down and more on the  right lower abdomen than it was at initial onset.   She has had nausea most of the day and was given something for nausea at Dr.  Kevan Ny' office before coming here but we are not sure what that was. She  cannot really do much to get comfortable other that to lie a little bit on  her left side which seems to help. She has never really had any similar  symptoms to this in the past, and feels like the pain and nausea have not  gotten any better throughout the day. She has had any fever or chills at  home. She has not had any urinary tract symptoms. She did just start her  menstrual period.   PAST MEDICAL HISTORY:  She has had a cesarean section and surgery  for an  ectopic pregnancy.   MEDICATIONS:  She is on no regular medications.   ALLERGIES:  None known.   HABITS:  She does smoke about four or five cigarettes a day. She drinks  alcohol occasionally.   FAMILY HISTORY:  Her father has had diverticulitis and her mother was  treated for breast cancer and is a five year survivor.   REVIEW OF SYSTEMS:  HEENT:  No problems with  headaches, etc. Eyes negative,  ears negative, throat negative, neck negative. CHEST:  No cough, no  shortness of breath, no history of pneumonia, bronchitis, asthma. HEART:  No  history of murmurs, hypertension, etc. ABDOMEN: Negative except for HPI and  a questionable history in the past for irritable  bowel. GYN:  Negative  except as noted in HPI. EXTREMITIES:  Negative.   PHYSICAL EXAMINATION:  GENERAL:  The patient is a generally healthy 55-year-  old who appears her stated age. She appears reasonably comfortable and not  toxic at the present time.  VITAL SIGNS:  Temperature 100.1, blood pressure 128/59, pulse 80,  respirations 18.  HEENT:  Normocephalic.  Eyes anicteric. Pupils equally round and reactive to  light. Pharynx mucous membranes are dry.  NECK:  Supple, no masses or thyromegaly.  LUNGS:  Clear to auscultation.  HEART:  Regular rate and rhythm, no murmurs, rubs, gallops are heard.  ABDOMEN:  Soft, but she does have some guarding on the right half of her  abdomen and is tender on the right lower abdomen but also tender in the left  mid abdomen as well. She is not particularly distended. There is no definite  rebound present. Bowel sounds are present.  PELVIC AND RECTAL:  Not done.  EXTREMITIES:  No cyanosis or edema.   LABORATORY DATA:  Available thus far are just a CBC from Dr. Kevan Ny' office  which showed a white count of 22,000.   IMPRESSION:  Nausea and vomiting and abdominal pain of uncertain etiology,  suspicious for appendicitis.   PLAN:  She has a CMET and urinalysis pending. I have  also requested a CT  scan to see if we can distinguish between appendicitis and some form of an  enteritis that may be causing her current symptoms. She understands the  plan, and once we have the results of further studies, we can make a further  disposition.                                               Currie Paris, M.D.    CJS/MEDQ  D:  10/10/2002  T:  10/11/2002  Job:  [id number]

## 2011-05-07 ENCOUNTER — Ambulatory Visit (INDEPENDENT_AMBULATORY_CARE_PROVIDER_SITE_OTHER): Payer: BC Managed Care – PPO | Admitting: Internal Medicine

## 2011-05-07 ENCOUNTER — Encounter: Payer: Self-pay | Admitting: Internal Medicine

## 2011-05-07 VITALS — BP 128/70 | Temp 98.4°F | Wt 228.0 lb

## 2011-05-07 DIAGNOSIS — R002 Palpitations: Secondary | ICD-10-CM

## 2011-05-07 NOTE — Progress Notes (Signed)
  Subjective:    Patient ID: Alison Best, female    DOB: [birth year], 55 y.o.   MRN: [id number]  HPI  55 year old patient who presents today with a chief complaint of palpitations. She has had some rare palpitations over the years but for the past 3 days describes a frequent episodes of very brief self-limited seen irregular palpitations. These last a few seconds but recur at varying intervals. She has no associated symptoms. She has cut her caffeine consumption down the past 2 days and they have improved she continues to smoke. She does describe her job in present situation is somewhat stressful. She is very sedentary. Her father has atrial fibrillation Review of Systems  Constitutional: Negative.   HENT: Negative for hearing loss, congestion, sore throat, rhinorrhea, dental problem, sinus pressure and tinnitus.   Eyes: Negative for pain, discharge and visual disturbance.  Respiratory: Negative for cough and shortness of breath.   Cardiovascular: Positive for palpitations. Negative for chest pain and leg swelling.  Gastrointestinal: Negative for nausea, vomiting, abdominal pain, diarrhea, constipation, blood in stool and abdominal distention.  Genitourinary: Negative for dysuria, urgency, frequency, hematuria, flank pain, vaginal bleeding, vaginal discharge, difficulty urinating, vaginal pain and pelvic pain.  Musculoskeletal: Negative for joint swelling, arthralgias and gait problem.  Skin: Negative for rash.  Neurological: Negative for dizziness, syncope, speech difficulty, weakness, numbness and headaches.  Hematological: Negative for adenopathy.  Psychiatric/Behavioral: Negative for behavioral problems, dysphoric mood and agitation. The patient is not nervous/anxious.        Objective:   Physical Exam  Constitutional: She is oriented to person, place, and time. She appears well-developed and well-nourished.  HENT:  Head: Normocephalic.  Right Ear: External ear normal.  Left Ear:  External ear normal.  Mouth/Throat: Oropharynx is clear and moist.  Eyes: Conjunctivae and EOM are normal. Pupils are equal, round, and reactive to light.  Neck: Normal range of motion. Neck supple. No thyromegaly present.  Cardiovascular: Normal rate, regular rhythm, normal heart sounds and intact distal pulses.   Pulmonary/Chest: Effort normal and breath sounds normal.  Abdominal: Soft. Bowel sounds are normal. She exhibits no mass. There is no tenderness.  Musculoskeletal: Normal range of motion.  Lymphadenopathy:    She has no cervical adenopathy.  Neurological: She is alert and oriented to person, place, and time.  Skin: Skin is warm and dry. No rash noted.  Psychiatric: She has a normal mood and affect. Her behavior is normal.          Assessment & Plan:  Palpitations. Normal EKG  The patient will moderate her caffeine use and attempt to discontinue tobacco use. Palpitations are much improved today. Her exam and EKG are normal. We'll clinically observe at this time but if symptoms continue may consider 2-D echo Holter or event monitor.

## 2011-06-14 ENCOUNTER — Other Ambulatory Visit: Payer: Self-pay | Admitting: Family Medicine

## 2011-06-14 NOTE — Telephone Encounter (Signed)
Pt called req renewal script for Proair HFA 108 (90 base) mcg/act aers Albuterol Sulfate) 2 puffs every 4 hrs as needed sob. Pls call in to [facility] at [place].

## 2011-06-16 MED ORDER — ALBUTEROL SULFATE HFA 108 (90 BASE) MCG/ACT IN AERS: 2.0000 | INHALATION_SPRAY | RESPIRATORY_TRACT | Status: DC | PRN

## 2011-06-16 NOTE — Telephone Encounter (Signed)
Script sent e-scribe 

## 2011-06-17 ENCOUNTER — Other Ambulatory Visit: Payer: BC Managed Care – PPO

## 2011-06-23 ENCOUNTER — Encounter: Payer: BC Managed Care – PPO | Admitting: Family Medicine

## 2011-07-05 ENCOUNTER — Ambulatory Visit: Payer: BC Managed Care – PPO

## 2011-07-05 DIAGNOSIS — E785 Hyperlipidemia, unspecified: Secondary | ICD-10-CM

## 2011-07-05 DIAGNOSIS — Z0000 Encounter for general adult medical examination without abnormal findings: Secondary | ICD-10-CM

## 2011-07-05 LAB — LIPID PANEL
HDL: 29 mg/dL — ABNORMAL LOW (ref >39.00)
Triglycerides: 534 mg/dL — ABNORMAL HIGH (ref 0.0–149.0)
VLDL: 106.8 mg/dL — ABNORMAL HIGH (ref 0.0–40.0)

## 2011-07-05 LAB — CBC WITH DIFFERENTIAL/PLATELET
Basophils Absolute: 0 10*3/uL (ref 0.0–0.1)
Lymphocytes Relative: 31.4 % (ref 12.0–46.0)
Lymphs Abs: 2.7 10*3/uL (ref 0.7–4.0)
Monocytes Relative: 7.5 % (ref 3.0–12.0)
Neutrophils Relative %: 58.2 % (ref 43.0–77.0)
Platelets: 286 10*3/uL (ref 150.0–400.0)
RDW: 14.4 % (ref 11.5–14.6)
WBC: 8.7 10*3/uL (ref 4.5–10.5)

## 2011-07-05 LAB — BASIC METABOLIC PANEL
BUN: 11 mg/dL (ref 6–23)
Calcium: 8.6 mg/dL (ref 8.4–10.5)
Creatinine, Ser: 0.8 mg/dL (ref 0.4–1.2)
GFR: 84.33 mL/min (ref >60.00)
Glucose, Bld: 111 mg/dL — ABNORMAL HIGH (ref 70–99)

## 2011-07-05 LAB — URINALYSIS
Bilirubin Urine: NEGATIVE
Ketones, ur: NEGATIVE
Leukocytes, UA: NEGATIVE
Nitrite: NEGATIVE
Specific Gravity, Urine: >=1.03 (ref 1.000–1.030)
Urobilinogen, UA: 0.2 (ref 0.0–1.0)
pH: 5.5 (ref 5.0–8.0)

## 2011-07-05 LAB — HEPATIC FUNCTION PANEL
Albumin: 3.8 g/dL (ref 3.5–5.2)
Total Protein: 6.4 g/dL (ref 6.0–8.3)

## 2011-07-05 LAB — LDL CHOLESTEROL, DIRECT: Direct LDL: 83.7 mg/dL

## 2011-07-05 LAB — TSH: TSH: 1.68 u[IU]/mL (ref 0.35–5.50)

## 2011-07-08 ENCOUNTER — Telehealth: Payer: Self-pay | Admitting: Family Medicine

## 2011-07-08 MED ORDER — FENOFIBRATE 160 MG PO TABS: 160.0000 mg | ORAL_TABLET | Freq: Every day | ORAL | Status: DC

## 2011-07-08 NOTE — Telephone Encounter (Signed)
Message copied by Baldemar Friday on Thu Jul 08, 2011  2:32 PM ------      Message from: Gershon Crane A      Created: Tue Jul 06, 2011  5:36 AM       Normal except for very high TG. Start on Fenofibrate 160 mg daily. Call in one year supply. Check lipids and liver panel in 90 days

## 2011-07-08 NOTE — Telephone Encounter (Signed)
Spoke with pt and gave results. I also put in future lab order and called in script.

## 2011-07-13 ENCOUNTER — Ambulatory Visit (INDEPENDENT_AMBULATORY_CARE_PROVIDER_SITE_OTHER): Payer: BC Managed Care – PPO | Admitting: Family Medicine

## 2011-07-13 DIAGNOSIS — Z0000 Encounter for general adult medical examination without abnormal findings: Secondary | ICD-10-CM

## 2011-07-13 DIAGNOSIS — Z23 Encounter for immunization: Secondary | ICD-10-CM

## 2011-08-28 NOTE — Progress Notes (Signed)
System Downtime Recovery The EMR experienced a system downtime.  This downtime occurred on 07-13-2011. During this downtime paper charting was completed by the provider.  The visit was documented on paper during the downtime and will be scanned into [facility]/Epic, billing was completed by the [facility] Billing Department .  The visit is being closed on behalf of the provider. 

## 2011-11-02 ENCOUNTER — Other Ambulatory Visit (INDEPENDENT_AMBULATORY_CARE_PROVIDER_SITE_OTHER): Payer: BC Managed Care – PPO

## 2011-11-02 DIAGNOSIS — E785 Hyperlipidemia, unspecified: Secondary | ICD-10-CM

## 2011-11-02 LAB — HEPATIC FUNCTION PANEL
ALT: 21 U/L (ref 0–35)
AST: 20 U/L (ref 0–37)
Total Bilirubin: 0.5 mg/dL (ref 0.3–1.2)
Total Protein: 6.7 g/dL (ref 6.0–8.3)

## 2011-11-02 LAB — LIPID PANEL
Cholesterol: 164 mg/dL (ref 0–200)
HDL: 31.8 mg/dL — ABNORMAL LOW (ref >39.00)
Triglycerides: 198 mg/dL — ABNORMAL HIGH (ref 0.0–149.0)

## 2011-11-04 ENCOUNTER — Telehealth: Payer: Self-pay

## 2011-11-04 NOTE — Telephone Encounter (Signed)
Pt called requesting lab results

## 2011-11-05 NOTE — Telephone Encounter (Signed)
See the results note.

## 2011-11-08 ENCOUNTER — Encounter: Payer: Self-pay | Admitting: Family Medicine

## 2011-11-08 NOTE — Telephone Encounter (Signed)
Spoke with pt

## 2011-11-08 NOTE — Progress Notes (Signed)
Quick Note:  I spoke with pt and put a copy of results in mail. ______ 

## 2012-01-10 ENCOUNTER — Telehealth: Payer: Self-pay | Admitting: Family Medicine

## 2012-01-10 NOTE — Telephone Encounter (Signed)
Yes, please schedule.

## 2012-01-10 NOTE — Telephone Encounter (Signed)
Pt called and said that she may have a fracture in rt wrist last. Pt says that wrist is bruised and swollen, even after icing it down for several days. Pt is req to get xray ordered. Advised pt she would need to come in be eval first. Pt req work in [employer] for tomorrow am.

## 2012-01-10 NOTE — Telephone Encounter (Signed)
Called pt and sch her an ov to see pcp on 01/11/12 at 10:30am for eval of wrist re:?fracture.

## 2012-01-11 ENCOUNTER — Ambulatory Visit (INDEPENDENT_AMBULATORY_CARE_PROVIDER_SITE_OTHER)
Admission: RE | Admit: 2012-01-11 | Discharge: 2012-01-11 | Disposition: A | Discharge status: Home / Self Care | Payer: BC Managed Care – PPO | Source: Ambulatory Visit | Attending: Family Medicine | Admitting: Family Medicine

## 2012-01-11 ENCOUNTER — Ambulatory Visit (INDEPENDENT_AMBULATORY_CARE_PROVIDER_SITE_OTHER): Payer: BC Managed Care – PPO | Admitting: Family Medicine

## 2012-01-11 ENCOUNTER — Encounter: Payer: Self-pay | Admitting: Family Medicine

## 2012-01-11 VITALS — BP 104/70 | HR 75 | Temp 98.2°F | Wt 218.0 lb

## 2012-01-11 DIAGNOSIS — S63509A Unspecified sprain of unspecified wrist, initial encounter: Secondary | ICD-10-CM
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
## 2012-01-12 ENCOUNTER — Telehealth: Payer: Self-pay | Admitting: Family Medicine

## 2012-01-12 NOTE — Telephone Encounter (Signed)
Spoke with pt and gave results. 

## 2012-01-12 NOTE — Telephone Encounter (Signed)
Pt is call back concerning hand xray

## 2012-01-12 NOTE — Progress Notes (Signed)
Quick Note:  Spoke with pt ______ 

## 2012-01-12 NOTE — Telephone Encounter (Signed)
Pt requesting results of xray °

## 2012-02-05 ENCOUNTER — Other Ambulatory Visit: Payer: Self-pay | Admitting: Family Medicine

## 2012-02-08 ENCOUNTER — Ambulatory Visit (INDEPENDENT_AMBULATORY_CARE_PROVIDER_SITE_OTHER): Payer: BC Managed Care – PPO | Admitting: Family Medicine

## 2012-02-08 ENCOUNTER — Encounter: Payer: Self-pay | Admitting: Family Medicine

## 2012-02-08 VITALS — BP 120/80 | HR 98 | Temp 99.2°F | Wt 221.0 lb

## 2012-02-08 DIAGNOSIS — J40 Bronchitis, not specified as acute or chronic: Secondary | ICD-10-CM

## 2012-02-08 MED ORDER — LEVOFLOXACIN 500 MG PO TABS: 500.0000 mg | ORAL_TABLET | Freq: Every day | ORAL | Status: AC

## 2012-02-08 NOTE — Progress Notes (Signed)
  Subjective:    Patient ID: Alison Best, female    DOB: [birth year], 56 y.o.   MRN: [id number]  HPI Here for one week of sinus pressure, PND, chest congestion, and coughing up green sputum.    Review of Systems  Constitutional: Negative.   HENT: Positive for congestion, postnasal drip and sinus pressure.   Eyes: Negative.   Respiratory: Positive for cough, chest tightness and wheezing. Negative for shortness of breath.   Cardiovascular: Negative.        Objective:   Physical Exam  Constitutional: She appears well-developed and well-nourished.  HENT:  Right Ear: External ear normal.  Left Ear: External ear normal.  Nose: Nose normal.  Mouth/Throat: Oropharynx is clear and moist. No oropharyngeal exudate.  Eyes: Conjunctivae are normal.  Pulmonary/Chest: Effort normal. No respiratory distress. She has no rales.       Scattered wheezes and rhonchi  Lymphadenopathy:    She has no cervical adenopathy.          Assessment & Plan:  Use Levaquin and Mucinex

## 2012-02-21 ENCOUNTER — Encounter: Payer: Self-pay | Admitting: Family

## 2012-02-21 ENCOUNTER — Ambulatory Visit (INDEPENDENT_AMBULATORY_CARE_PROVIDER_SITE_OTHER): Payer: BC Managed Care – PPO | Admitting: Family

## 2012-02-21 VITALS — BP 122/70 | Temp 98.8°F | Wt 215.0 lb

## 2012-02-21 DIAGNOSIS — R109 Unspecified abdominal pain: Secondary | ICD-10-CM

## 2012-02-21 DIAGNOSIS — K5730 Diverticulosis of large intestine without perforation or abscess without bleeding: Secondary | ICD-10-CM

## 2012-02-21 DIAGNOSIS — K5790 Diverticulosis of intestine, part unspecified, without perforation or abscess without bleeding: Secondary | ICD-10-CM

## 2012-02-21 LAB — BASIC METABOLIC PANEL
GFR: 78.16 mL/min (ref >60.00)
Glucose, Bld: 94 mg/dL (ref 70–99)
Potassium: 4.1 mEq/L (ref 3.5–5.1)
Sodium: 139 mEq/L (ref 135–145)

## 2012-02-21 LAB — CBC WITH DIFFERENTIAL/PLATELET
Eosinophils Absolute: 0.1 10*3/uL (ref 0.0–0.7)
Eosinophils Relative: 0.5 % (ref 0.0–5.0)
MCV: 92.4 fl (ref 78.0–100.0)
Monocytes Absolute: 1.6 10*3/uL — ABNORMAL HIGH (ref 0.1–1.0)
Neutrophils Relative %: 79.2 % — ABNORMAL HIGH (ref 43.0–77.0)
Platelets: 419 10*3/uL — ABNORMAL HIGH (ref 150.0–400.0)
WBC: 18.4 10*3/uL (ref 4.5–10.5)

## 2012-02-21 MED ORDER — MOXIFLOXACIN HCL 400 MG PO TABS: 400.0000 mg | ORAL_TABLET | Freq: Every day | ORAL | Status: AC

## 2012-02-21 NOTE — Progress Notes (Signed)
Subjective:    Patient ID: Alison Best, female    DOB: 03/05/1956, 56 y.o.   MRN: [id number]  Abdominal Pain This is a recurrent problem. The current episode started 1 to 4 weeks ago. The onset quality is gradual. The problem occurs intermittently. The problem has been gradually worsening. The pain is located in the LLQ. The pain is at a severity of 7/10. The pain is moderate. The quality of the pain is cramping and a sensation of fullness. The abdominal pain does not radiate. Associated symptoms include anorexia and diarrhea. Pertinent negatives include no fever, hematochezia, hematuria, melena, nausea or vomiting. The pain is aggravated by certain positions and eating. The pain is relieved by being still. Treatments tried: Glycopyinolate 2.5mg . The treatment provided no relief. Prior diagnostic workup includes CT scan (orginally diagnosed 1.5 years ago). Diverticulitis      Review of Systems  Constitutional: Negative.  Negative for fever.  Respiratory: Negative.   Cardiovascular: Negative.   Gastrointestinal: Positive for abdominal pain, diarrhea and anorexia. Negative for nausea, vomiting, melena and hematochezia.  Genitourinary: Negative for hematuria.  Musculoskeletal: Negative.   Skin: Negative.   Neurological: Negative.   Hematological: Negative.   Psychiatric/Behavioral: Negative.    No past medical history on file.  History   Social History  . Marital Status: Married    Spouse Name: N/A    Number of Children: N/A  . Years of Education: N/A   Occupational History  . Not on file.   Social History Main Topics  . Smoking status: Current Everyday Smoker -- 0.5 packs/day    Types: Cigarettes  . Smokeless tobacco: Never Used   Comment: less than 1/2 pack a day  . Alcohol Use: 0.5 oz/week    1 drink(s) per week  . Drug Use: No  . Sexually Active: Not on file   Other Topics Concern  . Not on file   Social History Narrative  . No narrative on file    No past  surgical history on file.  No family history on file.  No Known Allergies  Current Outpatient Prescriptions on File Prior to Visit  Medication Sig Dispense Refill  . albuterol (PROAIR HFA) 108 (90 BASE) MCG/ACT inhaler Inhale 2 puffs into the lungs every 4 (four) hours as needed for wheezing.  1 Inhaler  3  . fenofibrate 160 MG tablet Take 1 tablet (160 mg total) by mouth daily.  30 tablet  11  . furosemide (LASIX) 20 MG tablet TAKE 1 TABLET BY MOUTH DAILY AS NEEDED (FLUID)  30 tablet  10  . glycopyrrolate (ROBINUL) 2 MG tablet Take 2 mg by mouth 2 (two) times daily.        BP 122/70  Temp(Src) 98.8 F (37.1 C) (Oral)  Wt 215 lb (97.523 kg)chart    Objective:   Physical Exam  Constitutional: She is oriented to person, place, and time. She appears well-developed and well-nourished.  HENT:  Right Ear: External ear normal.  Left Ear: External ear normal.  Nose: Nose normal.  Mouth/Throat: Oropharynx is clear and moist.  Neck: Normal range of motion. Neck supple.  Cardiovascular: Normal rate, regular rhythm and normal heart sounds.   Pulmonary/Chest: Effort normal and breath sounds normal.  Abdominal: Soft. There is tenderness. There is no rebound and no guarding.  Musculoskeletal: Normal range of motion.  Neurological: She is alert and oriented to person, place, and time.  Skin: Skin is warm and dry.  Psychiatric: She has a normal mood and  affect.          Assessment & Plan:  Assessment: Abdominal pain, diverticulitis-probable  Plan: Avelox to cover both gram-negative and gram-positive bacteria once a day x10 days. Drink plenty of fluids. Bland diet advance as tolerated. Patient on the opposite symptoms worsen or persist. Recheck a schedule, when necessary.

## 2012-02-21 NOTE — Patient Instructions (Signed)
Diverticulitis A diverticulum is a small pouch or sac on the colon. Diverticulosis is the presence of these diverticula on the colon. Diverticulitis is the irritation (inflammation) or infection of diverticula. CAUSES  The colon and its diverticula contain bacteria. If food particles block the tiny opening to a diverticulum, the bacteria inside can grow and cause an increase in pressure. This leads to infection and inflammation and is called diverticulitis. SYMPTOMS   Abdominal pain and tenderness. Usually, the pain is located on the left side of your abdomen. However, it could be located elsewhere.   Fever.   Bloating.   Feeling sick to your stomach (nausea).   Throwing up (vomiting).   Abnormal stools.  DIAGNOSIS  Your caregiver will take a history and perform a physical exam. Since many things can cause abdominal pain, other tests may be necessary. Tests may include:  Blood tests.   Urine tests.   X-ray of the abdomen.   CT scan of the abdomen.  Sometimes, surgery is needed to determine if diverticulitis or other conditions are causing your symptoms. TREATMENT  Most of the time, you can be treated without surgery. Treatment includes:  Resting the bowels by only having liquids for a few days. As you improve, you will need to eat a low-fiber diet.   Intravenous (IV) fluids if you are losing body fluids (dehydrated).   Antibiotic medicines that treat infections may be given.   Pain and nausea medicine, if needed.   Surgery if the inflamed diverticulum has burst.  HOME CARE INSTRUCTIONS   Try a clear liquid diet (broth, tea, or water for as long as directed by your caregiver). You may then gradually begin a low-fiber diet as tolerated. A low-fiber diet is a diet with less than 10 grams of fiber. Choose the foods below to reduce fiber in the diet:   White breads, cereals, rice, and pasta.   Cooked fruits and vegetables or soft fresh fruits and vegetables without the skin.     Ground or well-cooked tender beef, ham, veal, lamb, pork, or poultry.   Eggs and seafood.   After your diverticulitis symptoms have improved, your caregiver may put you on a high-fiber diet. A high-fiber diet includes 14 grams of fiber for every 1000 calories consumed. For a standard 2000 calorie diet, you would need 28 grams of fiber. Follow these diet guidelines to help you increase the fiber in your diet. It is important to slowly increase the amount fiber in your diet to avoid gas, constipation, and bloating.   Choose whole-grain breads, cereals, pasta, and brown rice.   Choose fresh fruits and vegetables with the skin on. Do not overcook vegetables because the more vegetables are cooked, the more fiber is lost.   Choose more nuts, seeds, legumes, dried peas, beans, and lentils.   Look for food products that have greater than 3 grams of fiber per serving on the Nutrition Facts label.   Take all medicine as directed by your caregiver.   If your caregiver has given you a follow-up appointment, it is very important that you go. Not going could result in lasting (chronic) or permanent injury, pain, and disability. If there is any problem keeping the appointment, call to reschedule.  SEEK MEDICAL CARE IF:   Your pain does not improve.   You have a hard time advancing your diet beyond clear liquids.   Your bowel movements do not return to normal.  SEEK IMMEDIATE MEDICAL CARE IF:   Your pain becomes   worse.   You have an oral temperature above 102 F (38.9 C), not controlled by medicine.   You have repeated vomiting.   You have bloody or black, tarry stools.   Symptoms that brought you to your caregiver become worse or are not getting better.  MAKE SURE YOU:   Understand these instructions.   Will watch your condition.   Will get help right away if you are not doing well or get worse.  Document Released: 07/21/2005 Document Revised: 09/30/2011 Document Reviewed:  11/16/2010 ExitCare Patient Information 2012 [employer]. 

## 2012-02-22 ENCOUNTER — Telehealth: Payer: Self-pay | Admitting: Family Medicine

## 2012-02-22 NOTE — Telephone Encounter (Signed)
Pt called req to get lab results from yesterday. Pls call. Pt says that she is having burning sensation in urinary tract, since taking med for diverticulitis. Pls call.

## 2012-02-22 NOTE — Telephone Encounter (Signed)
Pt is taking avelox

## 2012-02-23 NOTE — Telephone Encounter (Signed)
Pt states she is feeling much better.  Will call back if she needs anything.

## 2012-02-23 NOTE — Telephone Encounter (Signed)
Should not be a side effect of Avelox. But if she wants to drop off a urine in the lab, we can check it.

## 2012-05-01 ENCOUNTER — Telehealth: Payer: Self-pay | Admitting: Family Medicine

## 2012-05-01 NOTE — Telephone Encounter (Signed)
Caller: Loyd/Patient; PCP: Nelwyn Salisbury.; CB#: [phone];  Call regarding Abdominal Pain-Onset 04/29/12 and she has been having frequent loose stools.  Felt feverish yesterday and waves of feeling warm or flushed today. Hx Diverticulitis in April 2013; Triage per Diarrhea or Other change in Bowel Habits Protocol and appnt advised within 24 hours for " Diarrhea lasting longer than 24 hours AND not improving with Home Care". Appnt scheduled for 1615 on 05/02/12 with Dr. Clent Ridges.

## 2012-05-02 ENCOUNTER — Encounter: Payer: Self-pay | Admitting: Family Medicine

## 2012-05-02 ENCOUNTER — Ambulatory Visit (INDEPENDENT_AMBULATORY_CARE_PROVIDER_SITE_OTHER): Payer: BC Managed Care – PPO | Admitting: Family Medicine

## 2012-05-02 VITALS — BP 114/64 | HR 89 | Temp 98.7°F | Wt 218.0 lb

## 2012-05-02 DIAGNOSIS — M21619 Bunion of unspecified foot: Secondary | ICD-10-CM

## 2012-05-02 DIAGNOSIS — E669 Obesity, unspecified: Secondary | ICD-10-CM

## 2012-05-02 DIAGNOSIS — K5732 Diverticulitis of large intestine without perforation or abscess without bleeding: Secondary | ICD-10-CM

## 2012-05-02 MED ORDER — MOXIFLOXACIN HCL 400 MG PO TABS: 400.0000 mg | ORAL_TABLET | Freq: Every day | ORAL | Status: DC

## 2012-05-02 MED ORDER — METRONIDAZOLE 500 MG PO TABS: 500.0000 mg | ORAL_TABLET | Freq: Two times a day (BID) | ORAL | Status: AC

## 2012-05-03 ENCOUNTER — Encounter: Payer: Self-pay | Admitting: Family Medicine

## 2012-05-03 MED ORDER — LEVOFLOXACIN 500 MG PO TABS: 500.0000 mg | ORAL_TABLET | Freq: Every day | ORAL | Status: AC

## 2012-05-03 NOTE — Addendum Note (Signed)
Addended by: Aniceto Boss A on: 05/03/2012 08:47 AM   Modules accepted: Orders

## 2012-05-03 NOTE — Progress Notes (Signed)
  Subjective:    Patient ID: Alison Best, female    DOB: November 16, 1955, 56 y.o.   MRN: [id number]  HPI hee with another bout of LLQ abdominal pain. She thinks this is diverticulitis again. She has typical pains in this area for the past 4 days. No fever or vomiting. BMs are normal. She was treated for this in April with Avelox, and she recovered well. Also she asks me to look at a painful lump on the left foot that has bothered her for several years. Lastly she asks to see a Nutritionist to help her with weight loss.    Review of Systems  Constitutional: Negative.   Respiratory: Negative.   Cardiovascular: Negative.   Gastrointestinal: Positive for abdominal pain. Negative for nausea, vomiting, diarrhea, constipation, blood in stool, abdominal distention and rectal pain.  Genitourinary: Negative.        Objective:   Physical Exam  Constitutional: She appears well-developed and well-nourished.  Abdominal: Soft. Bowel sounds are normal. She exhibits no distension and no mass. There is no rebound and no guarding.       Mildly tender in the LLQ  Musculoskeletal:       Tender bunion over the medial left first MTP joint          Assessment & Plan:  Recurrent diverticulitis. This time we will give her both Avelox and Flagyl. Refer to Nutrition. Refer to Podiatry.

## 2012-05-11 ENCOUNTER — Telehealth: Payer: Self-pay | Admitting: Family Medicine

## 2012-05-11 NOTE — Telephone Encounter (Signed)
Caller: Shawnise/Patient is calling with a question about Levofloxacin 500 Mg.The medication was written by Nelwyn Salisbury.. Patient is calling about patient is currently on generic Levaquin for Diverticulitis and is experiencing achilles tendon weakening.  Patient would have a very difficult time walking when taking medication but would then improve hours after medication was taken. Patient's last dose of Levaquin was 05/09/12.    All emergent s/s r/o with exception to new onset rash, joint pain, muscle aches, swollen glands or any temperature elevation without known cause and less than 10 days after starting new medication per Allergic Reaction, Severe protocol.  S/w Dr. Dan Humphreys per call provider within 4 hours disposition.  Prescription for Bactrim DS one BID #8, no refills, called into [facility] @ [phone] to [facility], pharmacy tech, per orders.  Advised patient of prescription and advised to call office 05/12/12 as directed by Dr. Dan Humphreys.

## 2012-05-12 NOTE — Telephone Encounter (Signed)
noted 

## 2012-06-02 ENCOUNTER — Encounter: Payer: Self-pay | Admitting: *Deleted

## 2012-06-02 ENCOUNTER — Encounter: Payer: BC Managed Care – PPO | Attending: Family Medicine | Admitting: *Deleted

## 2012-06-02 VITALS — Ht 61.0 in | Wt 216.0 lb

## 2012-06-02 DIAGNOSIS — K5732 Diverticulitis of large intestine without perforation or abscess without bleeding: Secondary | ICD-10-CM | POA: Insufficient documentation

## 2012-06-02 DIAGNOSIS — E669 Obesity, unspecified: Secondary | ICD-10-CM

## 2012-06-02 DIAGNOSIS — Z713 Dietary counseling and surveillance: Secondary | ICD-10-CM | POA: Insufficient documentation

## 2012-06-02 DIAGNOSIS — K5790 Diverticulosis of intestine, part unspecified, without perforation or abscess without bleeding: Secondary | ICD-10-CM

## 2012-06-02 NOTE — Progress Notes (Signed)
  Medical Nutrition Therapy:  Appt start time: 0930 end time:  1030.   Assessment:  Primary concerns today: obesity and diverticilitis.   MEDICATIONS: see list   DIETARY INTAKE:  Usual eating pattern includes 3 meals and 2 snacks per day.  Everyday foods include proteins, vegetables, starches, sweets.  Avoided foods include fried foods and red meats.    24-hr recall:  B ( AM): used to skip (may have tea) now has yogurt with cheerios or yogurt with fruit  Snk ( AM): not usually.  May have crackers  L ( PM): wendy's apple chicken salad; chineese noodles with chicken and onions; sandwiches with pasta salad, cookie Snk ( PM): sweets: cookies or doughnuts, cracker D ( PM): fish with brown rice and gbeans; pizza; takeout chicken wings with cole slaw and mac -n -cheese  Usual physical activity: none  Estimated energy needs: 1400 calories 158 g carbohydrates 105 g protein 39 g fat  Progress Towards Goal(s):  In progress.   Nutritional Diagnosis:  [place]-3.3 Overweight/obesity As related to large portions of refined carbohydrates and no physical activity.  As evidenced by BMI of 40.8.    Intervention:  Nutrition counseling provided.  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  She often picks up takeout food and has no time for (nor desire) to exercise.  She has elevated triglycerides most likely due to consumption of concentrated sweets and other refined carbohydrates.  Diagnosis of diverticulitis limits high  fibrous foods.  Discussed dietary recommendations for diverticulitis and low-fiver diet.  Discussed carb counting for portion control and encouraged restricting carbs to 1-2 servings/meal and to increase lean proteins and (cooked) vegetables.  Encouraged planning ahead with meal and snacks and buying groceries for whole week ahead of time.  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  Encouraged her to make healthier  choices.    Handouts given during visit include:  Low-fiber diet for diverticulitis  My Meal plan card  MyPlate  Monitoring/Evaluation:  Dietary intake, exercise,  and body weight in 5 week(s).Marland Kitchen

## 2012-06-02 NOTE — Patient Instructions (Signed)
Goals:  Eat 3 meals/day, Avoid meal skipping   Increase protein rich foods  Follow "Plate Method" for portion control  Limit carbohydrate1-2 servings/meal   Choose more lean protein, low-fat dairy, and fruits/non-starchy vegetables.   Follow recommendations for diverticulitis  Limit sugar-sweetened beverages and concentrated sweets  Plan ahead with meals and snacks

## 2012-06-09 ENCOUNTER — Telehealth: Payer: Self-pay | Admitting: Family Medicine

## 2012-06-09 MED ORDER — METRONIDAZOLE 500 MG PO TABS: 500.0000 mg | ORAL_TABLET | Freq: Two times a day (BID) | ORAL | Status: AC

## 2012-06-09 MED ORDER — CIPROFLOXACIN HCL 500 MG PO TABS: 500.0000 mg | ORAL_TABLET | Freq: Two times a day (BID) | ORAL | Status: AC

## 2012-06-09 NOTE — Telephone Encounter (Signed)
Change from Levaquin to Cipro 500 mg bid for 10 days and add Flagyl 500mg  bid for 10 days

## 2012-06-09 NOTE — Telephone Encounter (Signed)
Call in Levaquin 500 mg a day for 10 days. See me next week. Go to the ER if the pain gets severe or if you get a fever

## 2012-06-09 NOTE — Telephone Encounter (Signed)
Alison Best, per patient she states Dr. Clent Ridges told her if she was having symptoms, he would call her in something. Please advise. Thanks.

## 2012-06-09 NOTE — Telephone Encounter (Signed)
Caller: Etherine/Patient; Patient Name: Alison Best; PCP: Nelwyn Salisbury.; Best Callback Phone Number: [phone]. Onset 06/09/12. Pt has history of Diverticulitis and started experiencing waves abdominal discomfort 06/09/12.  Pt was advsed by Dr. Clent Ridges if symptoms reoccur to call in so medications can be called in immediately.  Pt going out of town this weekend.  All emergent symptoms ruled out per 'Abdominal Pain with exception to 'All other situations'.  See Provider in 72 hrs.  Pt's pharmacy on file verified.

## 2012-06-09 NOTE — Telephone Encounter (Signed)
Spoke with pt- aware of meds [facility] - instructed to call Monday and make rov end of week to f/u with dr. Clent Ridges.

## 2012-06-23 ENCOUNTER — Encounter: Payer: Self-pay | Admitting: Family Medicine

## 2012-06-23 ENCOUNTER — Ambulatory Visit (INDEPENDENT_AMBULATORY_CARE_PROVIDER_SITE_OTHER): Payer: BC Managed Care – PPO | Admitting: Family Medicine

## 2012-06-23 VITALS — BP 134/78 | HR 99 | Temp 98.8°F | Wt 220.0 lb

## 2012-06-23 DIAGNOSIS — K5792 Diverticulitis of intestine, part unspecified, without perforation or abscess without bleeding: Secondary | ICD-10-CM

## 2012-06-23 DIAGNOSIS — K5732 Diverticulitis of large intestine without perforation or abscess without bleeding: Secondary | ICD-10-CM

## 2012-06-23 MED ORDER — EPINEPHRINE 0.3 MG/0.3ML IJ DEVI: 0.3000 mg | Freq: Once | INTRAMUSCULAR | Status: DC

## 2012-06-23 MED ORDER — FENOFIBRATE 160 MG PO TABS: 160.0000 mg | ORAL_TABLET | Freq: Every day | ORAL | Status: DC

## 2012-06-23 NOTE — Progress Notes (Signed)
  Subjective:    Patient ID: Alison Best, female    DOB: July 06, 1956, 56 y.o.   MRN: [id number]  HPI Here to follow up on diverticulitis. She recently took a round of Cipro and Flagyl, and this was very successful. She is back to normal now and feels great. Normal BMs. She got a lot of burning pains in both Achilles tendons while taking Levaquin, and she got some mild pains in the right Achilles on Cipro. She saw a Nutritionist and they discussed dietary management.    Review of Systems  Constitutional: Negative.   Gastrointestinal: Negative.        Objective:   Physical Exam  Constitutional: She appears well-developed and well-nourished.  Abdominal: Soft. Bowel sounds are normal. She exhibits no distension and no mass. There is no tenderness. There is no rebound and no guarding.          Assessment & Plan:  She is doing well. I recommended she use Miralax daily. Recheck prn

## 2012-06-29 ENCOUNTER — Encounter: Payer: Self-pay | Admitting: Family Medicine

## 2012-06-29 ENCOUNTER — Ambulatory Visit (INDEPENDENT_AMBULATORY_CARE_PROVIDER_SITE_OTHER): Payer: BC Managed Care – PPO | Admitting: Family Medicine

## 2012-06-29 VITALS — BP 122/80 | HR 100 | Temp 98.3°F | Wt 218.0 lb

## 2012-06-29 DIAGNOSIS — J40 Bronchitis, not specified as acute or chronic: Secondary | ICD-10-CM

## 2012-06-29 MED ORDER — AZITHROMYCIN 250 MG PO TABS: ORAL_TABLET | ORAL | Status: AC

## 2012-06-29 NOTE — Progress Notes (Signed)
  Subjective:    Patient ID: Alison Best, female    DOB: 08-01-1956, 56 y.o.   MRN: [id number]  HPI Here for 4 ays of chest congestion and coughing up green sputum. Using Mucinex.   Review of Systems  Constitutional: Negative.   HENT: Negative.   Eyes: Negative.   Respiratory: Positive for cough and chest tightness.        Objective:   Physical Exam  Constitutional: She appears well-developed and well-nourished.  HENT:  Right Ear: External ear normal.  Left Ear: External ear normal.  Nose: Nose normal.  Mouth/Throat: Oropharynx is clear and moist.  Eyes: Conjunctivae are normal.  Pulmonary/Chest: Effort normal. No respiratory distress. She has no wheezes. She has no rales.       Scattered rhonchi   Lymphadenopathy:    She has no cervical adenopathy.          Assessment & Plan:  Recheck prn

## 2012-07-06 ENCOUNTER — Other Ambulatory Visit: Payer: Self-pay | Admitting: Gynecology

## 2012-07-07 ENCOUNTER — Ambulatory Visit: Payer: BC Managed Care – PPO | Admitting: *Deleted

## 2012-07-07 ENCOUNTER — Telehealth: Payer: Self-pay | Admitting: Family Medicine

## 2012-07-07 MED ORDER — AMOXICILLIN-POT CLAVULANATE 875-125 MG PO TABS: 1.0000 | ORAL_TABLET | Freq: Two times a day (BID) | ORAL | Status: AC

## 2012-07-07 NOTE — Telephone Encounter (Signed)
Caller: Emerson/Patient; Patient Name: Alison Best; PCP: Gershon Crane [facility]); Best Callback Phone Number: [phone]; Reason for call: Cough/Congestion.LMP-Pre menopausal.   Patient states she was in the office last week 06/29/12  for Bronchitis.  Given Zpack which improved symptoms but she is still coughing up mucus that is green. Irritation to throat  with talking, Unsure of fever- warm.  Voice is clear and calm.  She believes the Zpack was not strong enough and would like another prescription. Emergent s/sx ruled out per Upper Respiratory Infection URI - Protocol with exception to " Productive cough with colored sputum". See Provider in 24 hours. Home care instructions reviewed with patient. Understanding expressed. ADVISED I WOULD SEND OFFICE A REQUEST FOR REPEAT ANTIBIOTIC MEDICATION.   Engineer, site at [employer].

## 2012-07-07 NOTE — Telephone Encounter (Signed)
Call in Augmentin 875 bid for 10 days  

## 2012-07-07 NOTE — Telephone Encounter (Signed)
Please advise 

## 2012-07-07 NOTE — Telephone Encounter (Signed)
Rx sent to pharmacy.  Called and pt is aware.

## 2012-07-28 ENCOUNTER — Other Ambulatory Visit (INDEPENDENT_AMBULATORY_CARE_PROVIDER_SITE_OTHER): Payer: BC Managed Care – PPO

## 2012-07-28 ENCOUNTER — Encounter: Payer: BC Managed Care – PPO | Admitting: Family Medicine

## 2012-07-28 DIAGNOSIS — Z0000 Encounter for general adult medical examination without abnormal findings: Secondary | ICD-10-CM

## 2012-07-28 LAB — CBC WITH DIFFERENTIAL/PLATELET
Basophils Absolute: 0 10*3/uL (ref 0.0–0.1)
Eosinophils Absolute: 0.3 10*3/uL (ref 0.0–0.7)
Lymphocytes Relative: 37.8 % (ref 12.0–46.0)
Lymphs Abs: 2.9 10*3/uL (ref 0.7–4.0)
Monocytes Relative: 7.7 % (ref 3.0–12.0)
Platelets: 262 10*3/uL (ref 150.0–400.0)
RDW: 15.6 % — ABNORMAL HIGH (ref 11.5–14.6)

## 2012-07-28 LAB — BASIC METABOLIC PANEL
BUN: 15 mg/dL (ref 6–23)
Calcium: 8.7 mg/dL (ref 8.4–10.5)
GFR: 83.99 mL/min (ref >60.00)
Glucose, Bld: 112 mg/dL — ABNORMAL HIGH (ref 70–99)

## 2012-07-28 LAB — HEPATIC FUNCTION PANEL
ALT: 36 U/L — ABNORMAL HIGH (ref 0–35)
AST: 25 U/L (ref 0–37)
Alkaline Phosphatase: 63 U/L (ref 39–117)
Bilirubin, Direct: 0 mg/dL (ref 0.0–0.3)
Total Bilirubin: 0.5 mg/dL (ref 0.3–1.2)

## 2012-07-28 LAB — POCT URINALYSIS DIPSTICK
Blood, UA: NEGATIVE
Glucose, UA: NEGATIVE
Ketones, UA: NEGATIVE
Spec Grav, UA: 1.025
Urobilinogen, UA: 0.2

## 2012-07-28 LAB — LIPID PANEL
Cholesterol: 226 mg/dL — ABNORMAL HIGH (ref 0–200)
VLDL: 57 mg/dL — ABNORMAL HIGH (ref 0.0–40.0)

## 2012-07-28 LAB — LDL CHOLESTEROL, DIRECT: Direct LDL: 82 mg/dL

## 2012-07-28 NOTE — Progress Notes (Signed)
Quick Note:  I left voice message with results. ______ 

## 2012-08-04 ENCOUNTER — Encounter: Payer: BC Managed Care – PPO | Admitting: Family Medicine

## 2012-08-23 ENCOUNTER — Encounter: Payer: BC Managed Care – PPO | Admitting: Family Medicine

## 2012-10-10 ENCOUNTER — Ambulatory Visit (INDEPENDENT_AMBULATORY_CARE_PROVIDER_SITE_OTHER): Payer: BC Managed Care – PPO | Admitting: Family Medicine

## 2012-10-10 ENCOUNTER — Encounter: Payer: Self-pay | Admitting: Family Medicine

## 2012-10-10 VITALS — BP 134/82 | HR 90 | Temp 98.1°F | Ht 60.5 in | Wt 221.0 lb

## 2012-10-10 DIAGNOSIS — Z0000 Encounter for general adult medical examination without abnormal findings: Secondary | ICD-10-CM

## 2012-10-10 NOTE — Progress Notes (Signed)
  Subjective:    Patient ID: Alison Best, female    DOB: 02-16-1956, 56 y.o.   MRN: [id number]  HPI 56 yr old female for a cpx. She feels well in general. She admits to snacking more than usual lately due to job stresses. Her TG and glucose have both gone up from last time.    Review of Systems  Constitutional: Negative.   HENT: Negative.   Eyes: Negative.   Respiratory: Negative.   Cardiovascular: Negative.   Gastrointestinal: Negative.   Genitourinary: Negative for dysuria, urgency, frequency, hematuria, flank pain, decreased urine volume, enuresis, difficulty urinating, pelvic pain and dyspareunia.  Musculoskeletal: Negative.   Skin: Negative.   Neurological: Negative.   Hematological: Negative.   Psychiatric/Behavioral: Negative.        Objective:   Physical Exam  Constitutional: She is oriented to person, place, and time. She appears well-developed and well-nourished. No distress.  HENT:  Head: Normocephalic and atraumatic.  Right Ear: External ear normal.  Left Ear: External ear normal.  Nose: Nose normal.  Mouth/Throat: Oropharynx is clear and moist. No oropharyngeal exudate.  Eyes: Conjunctivae normal and EOM are normal. Pupils are equal, round, and reactive to light. No scleral icterus.  Neck: Normal range of motion. Neck supple. No JVD present. No thyromegaly present.  Cardiovascular: Normal rate, regular rhythm, normal heart sounds and intact distal pulses.  Exam reveals no gallop and no friction rub.   No murmur heard.      EKG normal   Pulmonary/Chest: Effort normal and breath sounds normal. No respiratory distress. She has no wheezes. She has no rales. She exhibits no tenderness.  Abdominal: Soft. Bowel sounds are normal. She exhibits no distension and no mass. There is no tenderness. There is no rebound and no guarding.  Musculoskeletal: Normal range of motion. She exhibits no edema and no tenderness.  Lymphadenopathy:    She has no cervical adenopathy.   Neurological: She is alert and oriented to person, place, and time. She has normal reflexes. No cranial nerve deficit. She exhibits normal muscle tone. Coordination normal.  Skin: Skin is warm and dry. No rash noted. No erythema.  Psychiatric: She has a normal mood and affect. Her behavior is normal. Judgment and thought content normal.          Assessment & Plan:  Well exam. Se discussed lowering her carbohydrate intake and losing weight.

## 2012-11-29 ENCOUNTER — Other Ambulatory Visit: Payer: Self-pay | Admitting: Family Medicine

## 2012-11-29 NOTE — Telephone Encounter (Signed)
Patient called again. Says she does not mean to be impatient, but symptoms started last night and she is worried it will escalate fast. Please advise.

## 2012-11-29 NOTE — Telephone Encounter (Signed)
Call in Augmentin 875 bid for 10 days  

## 2012-11-29 NOTE — Telephone Encounter (Signed)
Pt states she is instructed to call in when she feels an attack of diverticulitis, and she feels one coming on. Pt would like MD to call her something in for her. Pharm: [facility]/ [address]  **Pt has to go to a funeral and cannot come in.  Per pt: Pt cannot take Levequin

## 2012-11-30 MED ORDER — AMOXICILLIN-POT CLAVULANATE 875-125 MG PO TABS: 1.0000 | ORAL_TABLET | Freq: Two times a day (BID) | ORAL | Status: DC

## 2012-11-30 NOTE — Telephone Encounter (Signed)
Abx sent to pharmacy. Left message to advise pt

## 2013-03-08 ENCOUNTER — Other Ambulatory Visit: Payer: Self-pay | Admitting: Family Medicine

## 2013-05-04 ENCOUNTER — Telehealth: Payer: Self-pay | Admitting: Pulmonary Disease

## 2013-05-04 NOTE — Telephone Encounter (Signed)
ATC PT na--Do not see where pt has been seen in office. WCB

## 2013-05-07 NOTE — Telephone Encounter (Signed)
LMTCBx1, again do not see where this pt has ever been seen. Carron Curie, CMA

## 2013-05-08 NOTE — Telephone Encounter (Signed)
lmomtcb x 3 for pt 

## 2013-05-08 NOTE — Telephone Encounter (Signed)
Called spoke with patient who reports that [facility] told her at last visit that she needs a CPAP but she has recently decided to try this d/t OSA becoming worse.  No ov note in epic nor centricity.  KC with cancellation on 7.16.14 at 1030 >> [facility] scheduled. Paper chart ordered to [facility] schedule for tomorrow Will sign off

## 2013-05-08 NOTE — Telephone Encounter (Signed)
Pt returned triage's call & can be reached at [phone].  Pt states she saw [facility]; unsure when.  The last appt I'm showing is 03/11/05.  Unsure when sleep study was done...  Alison Best

## 2013-05-09 ENCOUNTER — Ambulatory Visit (INDEPENDENT_AMBULATORY_CARE_PROVIDER_SITE_OTHER): Payer: BC Managed Care – PPO | Admitting: Pulmonary Disease

## 2013-05-09 ENCOUNTER — Encounter: Payer: Self-pay | Admitting: Pulmonary Disease

## 2013-05-09 ENCOUNTER — Other Ambulatory Visit: Payer: Self-pay | Admitting: Pulmonary Disease

## 2013-05-09 VITALS — BP 118/70 | HR 91 | Temp 97.9°F | Ht 60.25 in | Wt 234.2 lb

## 2013-05-09 DIAGNOSIS — G4733 Obstructive sleep apnea (adult) (pediatric): Secondary | ICD-10-CM

## 2013-05-09 NOTE — Patient Instructions (Addendum)
Will start on cpap at moderate pressure.  Please call if having issues with tolerance. Work on weight loss followup with me in 6 weeks.

## 2013-05-09 NOTE — Progress Notes (Signed)
Subjective:    Patient ID: Alison Best, female    DOB: [birth year], 57 y.o.   MRN: [id number]  HPI The patient is a 57 year old female who comes in today as a self-referral for treatment of obstructive sleep apnea.  The patient was seen in 2005 where she had a sleep study which showed mild to moderate OSA.  She had an AHI of 16 events per hour.  At that time, she had significant nasal obstruction, and I referred her to otolaryngology for possible surgery that would either treat her sleep apnea or make CPAP easier to use.  The patient was seen by ENT, but she decided against any kind of treatment except for progressive weight loss.  She comes in today where her weight has actually gone up 25 pounds since her last visit, and she is having increased symptoms.  Her husband states that her snoring has worsened, as well as her abnormal breathing pattern during sleep.  She has frequent awakenings at night, and does not feel rested in the mornings upon arising.  She notes increasing fatigue during the day and does have some degree of sleep pressure with periods of inactivity.  She has no issues with driving.  Her Epworth score today is 8.   Sleep Questionnaire What time do you typically go to bed?( Between what hours) 1130p 1130p at 1041 on 05/09/13 by Nita Sells, CMA How long does it take you to fall asleep? 10-7mins 10-67mins at 1041 on 05/09/13 by Nita Sells, CMA How many times during the night do you wake up? 4 4 at 1041 on 05/09/13 by Nita Sells, CMA What time do you get out of bed to start your day? 0700 0700 at 1041 on 05/09/13 by Nita Sells, CMA Do you drive or operate heavy machinery in your occupation? No No at 1041 on 05/09/13 by Nita Sells, CMA How much has your weight changed (up or down) over the past two years? (In pounds) 10 lb (4.536 kg)10 lb (4.536 kg) increase at 1041 on 05/09/13 by Nita Sells, CMA Have you ever had a sleep study before? Yes Yes at 1041  on 05/09/13 by Nita Sells, CMA If yes, [facility]41 on 05/09/13 by Marjo Bicker Mabe, CMA If yes, date of study? 08/21/2004 08/21/2004 at 1041 on 05/09/13 by Nita Sells, CMA Do you currently use CPAP? No No at 1041 on 05/09/13 by Marjo Bicker Mabe, CMA Do you wear oxygen at any time? No No at 1041 on 05/09/13 by Marjo Bicker Mabe, CMA   Review of Systems  Constitutional: Negative for fever and unexpected weight change.  HENT: Positive for congestion, rhinorrhea and postnasal drip. Negative for ear pain, nosebleeds, sore throat, sneezing, trouble swallowing, dental problem and sinus pressure.   Eyes: Negative for redness and itching.  Respiratory: Positive for shortness of breath. Negative for cough, chest tightness and wheezing.   Cardiovascular: Positive for palpitations and leg swelling.  Gastrointestinal: Negative for nausea and vomiting.  Genitourinary: Negative for dysuria.  Musculoskeletal: Negative for joint swelling.  Skin: Positive for rash ( hives x 2.5 weeks ago).  Neurological: Positive for weakness ( in legs). Negative for headaches.  Hematological: Does not bruise/bleed easily.  Psychiatric/Behavioral: Negative for dysphoric mood. The patient is not nervous/anxious.        Objective:   Physical Exam Constitutional:  Obese female, no acute distress  HENT:  Nares patent without discharge  Oropharynx without  exudate, palate and uvula are elongated.   Eyes:  Perrla, eomi, no scleral icterus  Neck:  No JVD, no TMG  Cardiovascular:  Normal rate, regular rhythm, no rubs or gallops.  No murmurs        Intact distal pulses  Pulmonary :  Normal breath sounds, no stridor or respiratory distress   No rales, rhonchi, or wheezing  Abdominal:  Soft, nondistended, bowel sounds present.  No tenderness noted.   Musculoskeletal: mild lower extremity edema noted.  Lymph Nodes:  No cervical lymphadenopathy noted  Skin:  No cyanosis  noted  Neurologic:  Alert, appropriate, moves all 4 extremities without obvious deficit.         Assessment & Plan:

## 2013-05-09 NOTE — Assessment & Plan Note (Signed)
The patient has a history of mild to moderate obstructive sleep apnea in the past, but has subsequently gained significant weight and is having a definite increase in her symptoms.  At this point, I think she would benefit from aggressive treatment with CPAP while she is working on weight loss.  I have also discussed with her the possibility of a dental appliance.  She would like to start with CPAP and see how she responds. I will set the patient up on cpap at a moderate pressure level to allow for desensitization, and will troubleshoot the device over the next 4-6weeks if needed.  The pt is to call me if having issues with tolerance.  Will then optimize the pressure once patient is able to wear cpap on a consistent basis.

## 2013-05-22 ENCOUNTER — Telehealth: Payer: Self-pay | Admitting: Family Medicine

## 2013-05-22 MED ORDER — CIPROFLOXACIN HCL 500 MG PO TABS: 500.0000 mg | ORAL_TABLET | Freq: Two times a day (BID) | ORAL | Status: DC

## 2013-05-22 MED ORDER — AMOXICILLIN-POT CLAVULANATE 875-125 MG PO TABS: 1.0000 | ORAL_TABLET | Freq: Two times a day (BID) | ORAL | Status: DC

## 2013-05-22 NOTE — Telephone Encounter (Signed)
I sent scripts e-scribe and spoke with pt. 

## 2013-05-22 NOTE — Telephone Encounter (Signed)
Pt will call Dermatology for the gel.

## 2013-05-22 NOTE — Telephone Encounter (Signed)
Call in Cipro 500 mg bid for 14 days and Augmentin 875 bid for 14 days

## 2013-05-22 NOTE — Telephone Encounter (Signed)
Pt is going to [place] and also has diverticulitis and would like to have 14 day of cipro/amoxicillin on hand just in case of flare up. [facility] [address]. Pt has needs new rx metro-gel

## 2013-05-22 NOTE — Telephone Encounter (Signed)
What is the Metro gel for?

## 2013-06-20 ENCOUNTER — Ambulatory Visit: Payer: BC Managed Care – PPO | Admitting: Pulmonary Disease

## 2013-07-18 ENCOUNTER — Encounter: Payer: Self-pay | Admitting: Pulmonary Disease

## 2013-07-18 ENCOUNTER — Ambulatory Visit (INDEPENDENT_AMBULATORY_CARE_PROVIDER_SITE_OTHER): Payer: BC Managed Care – PPO | Admitting: Pulmonary Disease

## 2013-07-18 VITALS — BP 110/70 | HR 85 | Temp 98.4°F | Ht 60.25 in | Wt 238.8 lb

## 2013-07-18 DIAGNOSIS — G4733 Obstructive sleep apnea (adult) (pediatric): Secondary | ICD-10-CM

## 2013-07-18 NOTE — Assessment & Plan Note (Signed)
The patient has done fairly well with CPAP since the last visit, but wishes to try a nasal CPAP mask.  Her symptoms are much improved, and at this point we need to optimize her pressure.  I have also asked her to work aggressively on weight loss.  Care Plan:  At this point, will arrange for the patient's machine to be changed over to auto mode for 2 weeks to optimize their pressure.  I will review the downloaded data once sent by dme, and also evaluate for compliance, leaks, and residual osa.  I will call the patient and dme to discuss the results, and have the patient's machine set appropriately.  This will serve as the pt's cpap pressure titration.

## 2013-07-18 NOTE — Progress Notes (Signed)
  Subjective:    Patient ID: Alison Best, female    DOB: 24-Aug-1956, 57 y.o.   MRN: [id number]  HPI The patient comes in today for followup of her known obstructive sleep apnea.  She has been wearing CPAP compliantly since the last visit by her download, and feels that her symptoms are much improved.  She is tolerating the full face mask, but would prefer to try a nasal mask.  She understands this is not going to work if she has a lot of mouth opening.   Review of Systems  Constitutional: Negative for fever and unexpected weight change.  HENT: Negative for ear pain, nosebleeds, congestion, sore throat, rhinorrhea, sneezing, trouble swallowing, dental problem, postnasal drip and sinus pressure.   Eyes: Negative for redness and itching.  Respiratory: Negative for cough, chest tightness, shortness of breath and wheezing.   Cardiovascular: Negative for palpitations and leg swelling.  Gastrointestinal: Negative for nausea and vomiting.  Genitourinary: Negative for dysuria.  Musculoskeletal: Negative for joint swelling.  Skin: Negative for rash.  Neurological: Negative for headaches.  Hematological: Does not bruise/bleed easily.  Psychiatric/Behavioral: Negative for dysphoric mood. The patient is not nervous/anxious.        Objective:   Physical Exam Obese female in no acute distress Nose without purulence or discharge noted No skin breakdown or pressure necrosis from the CPAP mask Neck without lymphadenopathy or thyromegaly Lower extremities with minimal edema, no cyanosis Alert and oriented, moves all 4 extremities.       Assessment & Plan:

## 2013-07-18 NOTE — Patient Instructions (Addendum)
Will optimize your pressure on the auto setting for the next few weeks, and will let you know your optimal pressure. Will send an order for you to try a nasal mask Work on weight loss followup with me in 6mos, but call if having tolerance issues with cpap.

## 2013-08-09 ENCOUNTER — Other Ambulatory Visit: Payer: Self-pay | Admitting: Gynecology

## 2013-09-07 ENCOUNTER — Encounter: Payer: Self-pay | Admitting: Family Medicine

## 2013-09-07 ENCOUNTER — Ambulatory Visit (INDEPENDENT_AMBULATORY_CARE_PROVIDER_SITE_OTHER): Payer: BC Managed Care – PPO | Admitting: Family Medicine

## 2013-09-07 VITALS — BP 124/80 | HR 97 | Temp 98.0°F | Wt 233.0 lb

## 2013-09-07 DIAGNOSIS — J209 Acute bronchitis, unspecified: Secondary | ICD-10-CM

## 2013-09-07 MED ORDER — METHYLPREDNISOLONE 4 MG PO KIT: PACK | ORAL | Status: AC

## 2013-09-07 MED ORDER — AZITHROMYCIN 250 MG PO TABS: ORAL_TABLET | ORAL | Status: DC

## 2013-09-07 NOTE — Progress Notes (Signed)
Pre visit review using our clinic review tool, if applicable. No additional management support is needed unless otherwise documented below in the visit note. 

## 2013-09-07 NOTE — Progress Notes (Signed)
  Subjective:    Patient ID: Alison Best, female    DOB: 1956-07-18, 57 y.o.   MRN: [id number]  HPI Here for 3 weeks of chest tightness and coughing up green sputum. No fever. She took a week of Amoxicillin with no benefit.    Review of Systems  Constitutional: Negative.   HENT: Negative.   Eyes: Negative.   Respiratory: Positive for cough, chest tightness and wheezing. Negative for shortness of breath.   Cardiovascular: Negative.        Objective:   Physical Exam  Constitutional: She appears well-developed and well-nourished.  HENT:  Right Ear: External ear normal.  Left Ear: External ear normal.  Nose: Nose normal.  Mouth/Throat: Oropharynx is clear and moist.  Eyes: Conjunctivae are normal.  Pulmonary/Chest: Effort normal. No respiratory distress. She has no rales.  Scattered wheezes and rhonchi   Lymphadenopathy:    She has no cervical adenopathy.          Assessment & Plan:  Add Mucinex

## 2013-12-10 ENCOUNTER — Telehealth: Payer: Self-pay | Admitting: Family Medicine

## 2013-12-10 MED ORDER — METRONIDAZOLE 500 MG PO TABS: 500.0000 mg | ORAL_TABLET | Freq: Two times a day (BID) | ORAL | Status: DC

## 2013-12-10 MED ORDER — CIPROFLOXACIN HCL 500 MG PO TABS: 500.0000 mg | ORAL_TABLET | Freq: Two times a day (BID) | ORAL | Status: DC

## 2013-12-10 NOTE — Telephone Encounter (Signed)
Pt is having a diverticulitis episode.(cramping,diarrehea,pain)  Pt states she has had before, and md aware. Pt had cipro on had, took this for one week/ 1 @day . Pt states she is not better.  Due to the weather, would dr fry call in rx for pt? Pharm: [facility] [address]

## 2013-12-10 NOTE — Telephone Encounter (Signed)
Call in Cipro 500 mg BID for 10 days, also Flagyl 500 mg BID for 10 days. She needs to be seen if she gets a fever

## 2013-12-10 NOTE — Telephone Encounter (Signed)
I spoke with pt and sent both scripts e-scribe. 

## 2014-01-17 ENCOUNTER — Ambulatory Visit: Payer: BC Managed Care – PPO | Admitting: Pulmonary Disease

## 2014-02-01 ENCOUNTER — Encounter: Payer: Self-pay | Admitting: Pulmonary Disease

## 2014-02-04 ENCOUNTER — Ambulatory Visit: Payer: BC Managed Care – PPO | Admitting: Pulmonary Disease

## 2014-03-13 ENCOUNTER — Other Ambulatory Visit: Payer: Self-pay | Admitting: Obstetrics and Gynecology

## 2014-04-15 ENCOUNTER — Other Ambulatory Visit: Payer: Self-pay | Admitting: Family Medicine

## 2014-04-22 ENCOUNTER — Ambulatory Visit (INDEPENDENT_AMBULATORY_CARE_PROVIDER_SITE_OTHER): Payer: BC Managed Care – PPO | Admitting: Family Medicine

## 2014-04-22 ENCOUNTER — Encounter: Payer: Self-pay | Admitting: Family Medicine

## 2014-04-22 VITALS — BP 123/64 | HR 97 | Temp 98.5°F | Ht 60.25 in | Wt 220.0 lb

## 2014-04-22 DIAGNOSIS — E0789 Other specified disorders of thyroid: Secondary | ICD-10-CM

## 2014-04-22 DIAGNOSIS — D179 Benign lipomatous neoplasm, unspecified: Secondary | ICD-10-CM

## 2014-04-22 DIAGNOSIS — J309 Allergic rhinitis, unspecified: Secondary | ICD-10-CM

## 2014-04-22 LAB — CBC WITH DIFFERENTIAL/PLATELET
Basophils Absolute: 0 10*3/uL (ref 0.0–0.1)
Basophils Relative: 0.4 % (ref 0.0–3.0)
Eosinophils Absolute: 0.1 10*3/uL (ref 0.0–0.7)
Eosinophils Relative: 1.6 % (ref 0.0–5.0)
HCT: 42 % (ref 36.0–46.0)
Hemoglobin: 14.2 g/dL (ref 12.0–15.0)
Lymphocytes Relative: 31.6 % (ref 12.0–46.0)
Lymphs Abs: 2.8 10*3/uL (ref 0.7–4.0)
MCHC: 33.7 g/dL (ref 30.0–36.0)
MCV: 87 fl (ref 78.0–100.0)
MONO ABS: 0.6 10*3/uL (ref 0.1–1.0)
Monocytes Relative: 6.9 % (ref 3.0–12.0)
Neutro Abs: 5.3 10*3/uL (ref 1.4–7.7)
Neutrophils Relative %: 59.5 % (ref 43.0–77.0)
PLATELETS: 285 10*3/uL (ref 150.0–400.0)
RBC: 4.83 Mil/uL (ref 3.87–5.11)
RDW: 15.8 % — AB (ref 11.5–15.5)
WBC: 8.9 10*3/uL (ref 4.0–10.5)

## 2014-04-22 LAB — T4, FREE: Free T4: 1.12 ng/dL (ref 0.60–1.60)

## 2014-04-22 LAB — T3, FREE: T3, Free: 4.4 pg/mL — ABNORMAL HIGH (ref 2.3–4.2)

## 2014-04-22 LAB — TSH: TSH: 0.04 u[IU]/mL — AB (ref 0.35–4.50)

## 2014-04-22 MED ORDER — CIPROFLOXACIN HCL 500 MG PO TABS: 500.0000 mg | ORAL_TABLET | Freq: Two times a day (BID) | ORAL | Status: DC

## 2014-04-22 MED ORDER — METRONIDAZOLE 500 MG PO TABS: 500.0000 mg | ORAL_TABLET | Freq: Two times a day (BID) | ORAL | Status: DC

## 2014-04-22 MED ORDER — EPINEPHRINE 0.3 MG/0.3ML IJ SOAJ: 0.3000 mg | Freq: Once | INTRAMUSCULAR | Status: DC

## 2014-04-22 MED ORDER — ALBUTEROL SULFATE HFA 108 (90 BASE) MCG/ACT IN AERS: 2.0000 | INHALATION_SPRAY | RESPIRATORY_TRACT | Status: DC | PRN

## 2014-04-22 NOTE — Progress Notes (Signed)
Pre visit review using our clinic review tool, if applicable. No additional management support is needed unless otherwise documented below in the visit note. 

## 2014-04-22 NOTE — Progress Notes (Signed)
   Subjective:    Patient ID: Alison Best, female    DOB: 1956-04-17, 58 y.o.   MRN: [id number]  HPI Here for several issues. First she discovered a non-tender lump on the right hip about a month ago. Also she has had some mild discomfort around the throat area and some slight feelings that it may be swollen. No voice changes or trouble swallowing. She asks for an emergency supply of diverticulitis antibiotics to take with her on her upcoming vacation trip.    Review of Systems  Constitutional: Negative.   HENT: Negative.   Respiratory: Negative.        Objective:   Physical Exam  Constitutional: She appears well-developed.  HENT:  Right Ear: External ear normal.  Left Ear: External ear normal.  Nose: Nose normal.  Mouth/Throat: Oropharynx is clear and moist. No oropharyngeal exudate.  Eyes: Conjunctivae are normal.  Neck: Normal range of motion. Neck supple. No thyromegaly present.  Pulmonary/Chest: Effort normal and breath sounds normal.  Musculoskeletal:  Small mobile firm non-tender lump over the right anterior hip   Lymphadenopathy:    She has no cervical adenopathy.          Assessment & Plan:  She has a lipoma, and I reassured her this is benign. Here throat looks okay on exam but we will get some labs. Suggested she get back on Claritin daily.

## 2014-04-23 ENCOUNTER — Telehealth: Payer: Self-pay | Admitting: Family Medicine

## 2014-04-23 NOTE — Telephone Encounter (Signed)
Relevant patient education assigned to patient using Emmi. ° °

## 2014-05-01 NOTE — Addendum Note (Signed)
Addended by: Alysia Penna A on: 05/01/2014 11:55 PM   Modules accepted: Orders

## 2014-05-23 ENCOUNTER — Other Ambulatory Visit (INDEPENDENT_AMBULATORY_CARE_PROVIDER_SITE_OTHER): Payer: BC Managed Care – PPO

## 2014-05-23 DIAGNOSIS — Z0000 Encounter for general adult medical examination without abnormal findings: Secondary | ICD-10-CM

## 2014-05-23 LAB — HEPATIC FUNCTION PANEL
ALBUMIN: 3.7 g/dL (ref 3.5–5.2)
ALT: 26 U/L (ref 0–35)
AST: 21 U/L (ref 0–37)
Alkaline Phosphatase: 98 U/L (ref 39–117)
BILIRUBIN TOTAL: 0.4 mg/dL (ref 0.2–1.2)
Bilirubin, Direct: 0 mg/dL (ref 0.0–0.3)
Total Protein: 6.8 g/dL (ref 6.0–8.3)

## 2014-05-23 LAB — CBC WITH DIFFERENTIAL/PLATELET
Basophils Absolute: 0 10*3/uL (ref 0.0–0.1)
Basophils Relative: 0.4 % (ref 0.0–3.0)
EOS PCT: 2.3 % (ref 0.0–5.0)
Eosinophils Absolute: 0.2 10*3/uL (ref 0.0–0.7)
HEMATOCRIT: 42.9 % (ref 36.0–46.0)
HEMOGLOBIN: 14.6 g/dL (ref 12.0–15.0)
Lymphocytes Relative: 33.7 % (ref 12.0–46.0)
Lymphs Abs: 2.7 10*3/uL (ref 0.7–4.0)
MCHC: 34 g/dL (ref 30.0–36.0)
MCV: 87.5 fl (ref 78.0–100.0)
MONO ABS: 0.6 10*3/uL (ref 0.1–1.0)
MONOS PCT: 6.8 % (ref 3.0–12.0)
NEUTROS ABS: 4.6 10*3/uL (ref 1.4–7.7)
Neutrophils Relative %: 56.8 % (ref 43.0–77.0)
Platelets: 256 10*3/uL (ref 150.0–400.0)
RBC: 4.9 Mil/uL (ref 3.87–5.11)
RDW: 15.8 % — AB (ref 11.5–15.5)
WBC: 8.1 10*3/uL (ref 4.0–10.5)

## 2014-05-23 LAB — BASIC METABOLIC PANEL
BUN: 13 mg/dL (ref 6–23)
CO2: 25 mEq/L (ref 19–32)
Calcium: 9 mg/dL (ref 8.4–10.5)
Chloride: 109 mEq/L (ref 96–112)
Creatinine, Ser: 0.6 mg/dL (ref 0.4–1.2)
GFR: 111.76 mL/min (ref >60.00)
Glucose, Bld: 129 mg/dL — ABNORMAL HIGH (ref 70–99)
POTASSIUM: 3.9 meq/L (ref 3.5–5.1)
SODIUM: 140 meq/L (ref 135–145)

## 2014-05-23 LAB — LIPID PANEL
Cholesterol: 215 mg/dL — ABNORMAL HIGH (ref 0–200)
HDL: 31.6 mg/dL — ABNORMAL LOW (ref >39.00)
NONHDL: 183.4
Total CHOL/HDL Ratio: 7
Triglycerides: 284 mg/dL — ABNORMAL HIGH (ref 0.0–149.0)
VLDL: 56.8 mg/dL — ABNORMAL HIGH (ref 0.0–40.0)

## 2014-05-23 LAB — TSH: TSH: 0.02 u[IU]/mL — AB (ref 0.35–4.50)

## 2014-05-23 LAB — POCT URINALYSIS DIPSTICK
Bilirubin, UA: NEGATIVE
Glucose, UA: NEGATIVE
Ketones, UA: NEGATIVE
Leukocytes, UA: NEGATIVE
NITRITE UA: NEGATIVE
Protein, UA: NEGATIVE
Spec Grav, UA: 1.02
UROBILINOGEN UA: 0.2
pH, UA: 5.5

## 2014-05-23 LAB — LDL CHOLESTEROL, DIRECT: Direct LDL: 80 mg/dL

## 2014-05-28 ENCOUNTER — Encounter: Payer: Self-pay | Admitting: Family Medicine

## 2014-05-28 ENCOUNTER — Ambulatory Visit (INDEPENDENT_AMBULATORY_CARE_PROVIDER_SITE_OTHER): Payer: BC Managed Care – PPO | Admitting: Family Medicine

## 2014-05-28 VITALS — BP 116/71 | HR 80 | Temp 98.8°F | Ht 60.25 in | Wt 225.0 lb

## 2014-05-28 DIAGNOSIS — E119 Type 2 diabetes mellitus without complications: Secondary | ICD-10-CM

## 2014-05-28 DIAGNOSIS — E785 Hyperlipidemia, unspecified: Secondary | ICD-10-CM

## 2014-05-28 DIAGNOSIS — R0602 Shortness of breath: Secondary | ICD-10-CM

## 2014-05-28 DIAGNOSIS — Z0000 Encounter for general adult medical examination without abnormal findings: Secondary | ICD-10-CM

## 2014-05-28 LAB — MICROALBUMIN / CREATININE URINE RATIO
Creatinine,U: 110.8 mg/dL
Microalb Creat Ratio: 0.3 mg/g (ref 0.0–30.0)
Microalb, Ur: 0.3 mg/dL (ref 0.0–1.9)

## 2014-05-28 LAB — HEMOGLOBIN A1C: HEMOGLOBIN A1C: 6.4 % (ref 4.6–6.5)

## 2014-05-28 MED ORDER — FUROSEMIDE 20 MG PO TABS: ORAL_TABLET | ORAL | Status: DC

## 2014-05-28 MED ORDER — METFORMIN HCL 500 MG PO TABS: 500.0000 mg | ORAL_TABLET | Freq: Two times a day (BID) | ORAL | Status: DC

## 2014-05-28 MED ORDER — FENOFIBRATE 160 MG PO TABS: 160.0000 mg | ORAL_TABLET | Freq: Every day | ORAL | Status: DC

## 2014-05-28 NOTE — Progress Notes (Signed)
   Subjective:    Patient ID: Alison Best, female    DOB: January 16, 1956, 58 y.o.   MRN: [id number]  HPI 58 yr old female for a cpx. She has a few issues to discuss. First last month she asked me to check her thyroid gland because she felt a fullness in the neck. Her exam was not remarkable but we found her TSH to be suppressed and her free T3 was elevated, indicating a hyperthyroid state. We did a referral for her to see Endocrine for this but she has not made an appt since she was out of town on vacation. Also she complains of SOB for the past few months, often feeling out of breath with minimal exertion. No cough or chest pain. Her recent labs also showed a fasting glucose of 129.    Review of Systems  Constitutional: Negative.   HENT: Negative.   Eyes: Negative.   Respiratory: Positive for shortness of breath. Negative for apnea, cough, choking, chest tightness, wheezing and stridor.   Cardiovascular: Negative.   Gastrointestinal: Negative.   Genitourinary: Negative for dysuria, urgency, frequency, hematuria, flank pain, decreased urine volume, enuresis, difficulty urinating, pelvic pain and dyspareunia.  Musculoskeletal: Negative.   Skin: Negative.   Neurological: Negative.   Psychiatric/Behavioral: Negative.        Objective:   Physical Exam  Constitutional: She is oriented to person, place, and time. She appears well-developed and well-nourished. No distress.  HENT:  Head: Normocephalic and atraumatic.  Right Ear: External ear normal.  Left Ear: External ear normal.  Nose: Nose normal.  Mouth/Throat: Oropharynx is clear and moist. No oropharyngeal exudate.  Eyes: Conjunctivae and EOM are normal. Pupils are equal, round, and reactive to light. No scleral icterus.  Neck: Normal range of motion. Neck supple. No JVD present. No thyromegaly present.  Cardiovascular: Normal rate, regular rhythm, normal heart sounds and intact distal pulses.  Exam reveals no gallop and no friction  rub.   No murmur heard. EKG normal   Pulmonary/Chest: Effort normal and breath sounds normal. No respiratory distress. She has no wheezes. She has no rales. She exhibits no tenderness.  Abdominal: Soft. Bowel sounds are normal. She exhibits no distension and no mass. There is no tenderness. There is no rebound and no guarding.  Musculoskeletal: Normal range of motion. She exhibits no edema and no tenderness.  Lymphadenopathy:    She has no cervical adenopathy.  Neurological: She is alert and oriented to person, place, and time. She has normal reflexes. No cranial nerve deficit. She exhibits normal muscle tone. Coordination normal.  Skin: Skin is warm and dry. No rash noted. No erythema.  Psychiatric: She has a normal mood and affect. Her behavior is normal. Judgment and thought content normal.          Assessment & Plan:  Well exam. She needs to exercise and lose weight. We will set up a CXR to look at her SOB complaints. I urged her to contact Endocrinology to make an appt and she agreed. She has a new diagnosis of type 2 diabetes and we will refer her to Nutrition. Start on Metformin. Get a baseline A1c today. Recheck in one month

## 2014-05-28 NOTE — Progress Notes (Signed)
Pre visit review using our clinic review tool, if applicable. No additional management support is needed unless otherwise documented below in the visit note. 

## 2014-06-06 ENCOUNTER — Encounter: Payer: BC Managed Care – PPO | Attending: Family Medicine

## 2014-06-06 VITALS — Ht 60.5 in | Wt 221.0 lb

## 2014-06-06 DIAGNOSIS — Z713 Dietary counseling and surveillance: Secondary | ICD-10-CM | POA: Insufficient documentation

## 2014-06-06 DIAGNOSIS — R7309 Other abnormal glucose: Secondary | ICD-10-CM | POA: Diagnosis present

## 2014-06-06 DIAGNOSIS — R7303 Prediabetes: Secondary | ICD-10-CM

## 2014-06-06 NOTE — Progress Notes (Signed)

## 2014-06-12 ENCOUNTER — Ambulatory Visit
Admission: RE | Admit: 2014-06-12 | Discharge: 2014-06-12 | Disposition: A | Discharge status: Home / Self Care | Payer: BC Managed Care – PPO | Source: Ambulatory Visit | Attending: Family Medicine | Admitting: Family Medicine

## 2014-06-12 DIAGNOSIS — R0602 Shortness of breath: Secondary | ICD-10-CM

## 2014-06-18 ENCOUNTER — Ambulatory Visit (INDEPENDENT_AMBULATORY_CARE_PROVIDER_SITE_OTHER): Payer: BC Managed Care – PPO | Admitting: Internal Medicine

## 2014-06-18 ENCOUNTER — Encounter: Payer: Self-pay | Admitting: Internal Medicine

## 2014-06-18 VITALS — BP 100/58 | HR 98 | Temp 98.2°F | Resp 12 | Ht 60.25 in | Wt 223.0 lb

## 2014-06-18 DIAGNOSIS — E0590 Thyrotoxicosis, unspecified without thyrotoxic crisis or storm: Secondary | ICD-10-CM

## 2014-06-18 DIAGNOSIS — E041 Nontoxic single thyroid nodule: Secondary | ICD-10-CM

## 2014-06-18 DIAGNOSIS — E119 Type 2 diabetes mellitus without complications: Secondary | ICD-10-CM

## 2014-06-18 DIAGNOSIS — R7309 Other abnormal glucose: Secondary | ICD-10-CM | POA: Diagnosis not present

## 2014-06-18 LAB — T3, FREE: T3, Free: 3.1 pg/mL (ref 2.3–4.2)

## 2014-06-18 LAB — TSH: TSH: 0.02 u[IU]/mL — AB (ref 0.35–4.50)

## 2014-06-18 LAB — T4, FREE: Free T4: 0.97 ng/dL (ref 0.60–1.60)

## 2014-06-18 NOTE — Progress Notes (Addendum)
Patient ID: Alison Best, female   DOB: January 30, 1956, 58 y.o.   MRN: [id number]   HPI  Alison Best is a 58 y.o.-year-old female, referred by her PCP, Dr. Sarajane Jews in consultation for thyrotoxycosis.  The thyroid tests were checked as she complained of neck pressure. She still feels this occasionally (upper neck).  I reviewed pt's thyroid tests: Component     Latest Ref Rng 07/05/2011 07/28/2012 04/22/2014 05/23/2014  TSH     0.35 - 4.50 uIU/mL 1.68 1.51 0.04 (L) 0.02 (L)  T3, Free     2.3 - 4.2 pg/mL   4.4 (H)   Free T4     0.60 - 1.60 ng/dL   1.12     Pt denies feeling nodules in neck, but feels pressure in neck, no hoarseness, + dysphagia occasionally/no odynophagia, no SOB with lying down.  She also c/o: - + heat intolerance - started ~2-3 mo ago; she also has nightsweats - no tremors - no anxiety - + palpitations (less frequently as before) - investigated last year - no problems with concentration - no fatigue - no hyperdefecation/constipation - has diverticulitis - stable weight lately  Pt does not have a FH of thyroid ds. No FH of thyroid cancer. No h/o radiation tx to head or neck.  No seaweed or kelp, no recent contrast studies. No steroid use. No herbal supplements.   ROS: Constitutional: see HPI Eyes: no blurry vision, no xerophthalmia ENT: + sore throat, + see HPI Cardiovascular: no CP/+ SOB/+ palpitations/leg swelling Respiratory: no cough/+ SOB Gastrointestinal: no N/V/D/C Musculoskeletal: + all: muscle/joint aches Skin: + rash, + itching Neurological: no tremors/numbness/tingling/dizziness Psychiatric: no depression/anxiety  Past Medical History  Diagnosis Date  . Diverticulitis of colon   . Obesity   . Hyperlipidemia   . Sleep apnea   . Heart murmur    Past Surgical History  Procedure Laterality Date  . Colonoscopy  09-10-10    per Dr. Acquanetta Sit, diverticulosis of descending colon and sigmoid, internal hemorrhoids, repeat in 10 yrs  . Cesarean  section    . Appendectomy    . Tonsillectomy  1980   History   Social History  . Marital Status: Married    Spouse Name: N/A    Number of Children: 1   Occupational History  . Equities trader at [employer]  . Smoking status: Current Every Day Smoker -- 0.25 packs/day    Types: Cigarettes  . Smokeless tobacco: Never Used       . Alcohol Use: Yes     Comment: occ - wine 2x a week  . Drug Use: No   Current Outpatient Prescriptions on File Prior to Visit  Medication Sig Dispense Refill  . albuterol (PROAIR HFA) 108 (90 BASE) MCG/ACT inhaler Inhale 2 puffs into the lungs every 4 (four) hours as needed for wheezing.  1 Inhaler  11  . azithromycin (ZITHROMAX) 250 MG tablet As directed  6 tablet  0  . celecoxib (CELEBREX) 200 MG capsule Take 200 mg by mouth daily.      . ciprofloxacin (CIPRO) 500 MG tablet Take 1 tablet (500 mg total) by mouth 2 (two) times daily.  20 tablet  0  . EPINEPHrine 0.3 mg/0.3 mL IJ SOAJ injection Inject 0.3 mLs (0.3 mg total) into the muscle once.  1 Device  11  . fenofibrate 160 MG tablet Take 1 tablet (160 mg total) by mouth daily.  30 tablet  11  . fish oil-omega-3 fatty acids 1000 MG capsule Take 2 g by mouth as needed.      . furosemide (LASIX) 20 MG tablet TAKE 1 TABLET BY MOUTH DAILY AS NEEDED (FLUID)  30 tablet  11  . metFORMIN (GLUCOPHAGE) 500 MG tablet Take 1 tablet (500 mg total) by mouth 2 (two) times daily with a meal.  60 tablet  11  . metroNIDAZOLE (FLAGYL) 500 MG tablet Take 1 tablet (500 mg total) by mouth 2 (two) times daily.  20 tablet  0  . Multiple Vitamins-Minerals (MULTIVITAMIN WITH MINERALS) tablet Take 1 tablet by mouth daily.       No current facility-administered medications on file prior to visit.   Allergies  Allergen Reactions  . Levaquin [Levofloxacin In D5w] Other (See Comments)    Achilles tendon weakness  . Mango Butter   . Scallops [Shellfish Allergy]   . Soy Allergy   .  Tomato    Family History  Problem Relation Age of Onset  . Cancer Mother   . COPD Other   . Allergies Brother   . Heart disease Father   . Cancer Maternal Grandmother   . Skin cancer Father    PE: BP 100/58  Pulse 98  Temp(Src) 98.2 F (36.8 C) (Oral)  Resp 12  Ht 5' 0.25" (1.53 m)  Wt 223 lb (101.152 kg)  BMI 43.21 kg/m2  SpO2 96%  LMP 02/23/2012 Repeat pulse: 87 bpm at the time of appt Wt Readings from Last 3 Encounters:  06/18/14 223 lb (101.152 kg)  06/06/14 221 lb (100.245 kg)  05/28/14 225 lb (102.059 kg)   Constitutional: overweight, in NAD Eyes: PERRLA, EOMI, no exophthalmos, no lid lag, no stare ENT: moist mucous membranes, no thyromegaly, no thyroid bruits, no cervical lymphadenopathy Cardiovascular: RRR, No MRG Respiratory: CTA B Gastrointestinal: abdomen soft, NT, ND, BS+ Musculoskeletal: no deformities, strength intact in all 4 Skin: moist, warm, no rashes Neurological: no tremor with outstretched hands, DTR normal in all 4  ASSESSMENT: 1. Thyrotoxycosis  PLAN:  1. Patient with a recently found low TSH x2 and a high free T3, with few possible thyrotoxic sxs: heat intolerance, palpitations (although these are improved over last year).  - she does not appear to have exogenous causes for the low TSH.  - We discussed that possible causes of thyrotoxicosis are:  Graves ds   Thyroiditis toxic multinodular goiter/ toxic adenoma (I cannot feel nodules at palpation of his thyroid). - I suggested that we check the TSH, fT3 and fT4 and also had thyroid stimulating antibodies to screen for Graves' disease.  - If the tests remain abnormal I will obtain an uptake and scan to differentiate between the 3 above possible etiologies  - we discussed about possible modalities of treatment for the above conditions, to include methimazole use, radioactive iodine ablation or surgery. - I did advice her that we might need to do thyroid ultrasound depending on the results of the  uptake and scan (if a cold nodule is present) - I do not feel that we need to add beta blockers at this time - RTC in 3 months, but likely sooner for repeat labs  CC ObGyn: Dr Delila Pereyra  Component     Latest Ref Rng 05/23/2014 06/18/2014  TSH     0.35 - 4.50 uIU/mL 0.02 (L) 0.02 (L)  T3, Free     2.3 - 4.2 pg/mL  3.1  Free T4     0.60 - 1.60  ng/dL  0.97  TSI     <140 % baseline  111   Persistently low TSH with nl free T4 and free T3 >> will check an Uptake and scan.  CLINICAL DATA: Subclinical hyperthyroidism. Serum TSH 0.02.  EXAM: THYROID SCAN AND UPTAKE - 24 HOURS  TECHNIQUE: Following the per oral administration of I-131 sodium iodide, the patient returned at 24 hours and uptake measurements were acquired with the uptake probe centered on the neck. Thyroid imaging was performed following the intravenous administration of the Tc-29m Pertechnetate.  RADIOPHARMACEUTICALS: 7.7 microCuries I-131 Sodium Iodide and 11.0 mCi TC-50m Pertechnetate  COMPARISON: None.  FINDINGS: The 24 hr uptake by the thyroid gland is 28.5%. Normal 24 hr uptake is 10-30 %.  On thyroid imaging, the thyroid activity appears mildly heterogeneous in the right lobe with a possible cold nodule inferiorly. The left lobe activity appears homogeneous.  IMPRESSION: 1. Normal 24 hr uptake. 2. Heterogeneity within the right lobe inferiorly with a possible cold nodule. Correlation with thyroid ultrasound recommended.   Electronically Signed By: Camie Patience M.D. On: 07/10/2014 18:00  Will advise her to have a thyroid U/S to eval. R thyroid lobe.  CLINICAL DATA: Right thyroid nodule  EXAM: THYROID ULTRASOUND  TECHNIQUE: Ultrasound examination of the thyroid gland and adjacent soft tissues was performed.  COMPARISON: None.  FINDINGS: Right thyroid lobe  Measurements: 4.5 x 1.9 x 1.9 cm. Multiple nodules are noted throughout the right lobe of the thyroid. There is diffuse  irregular margined hypoechoic nodule measuring 16 mm in the upper pole. Multiple smaller nodules are identified.  Left thyroid lobe  Measurements: 4.4 x 1.7 x 1.9 cm. Multiple hypoechoic nodules are noted within the left lobe of the thyroid. The largest of these measures 15 mm in greatest dimension lying within the lower pole  Isthmus  Thickness: 0.5 cm. No nodules visualized.  Lymphadenopathy  None visualized.  IMPRESSION: Multiple bilateral thyroid nodules. The dominant nodules bilateral meet criteria for percutaneous biopsy. Ultrasound-guided fine needle aspiration should be considered, as per the consensus statement: Management of Thyroid Nodules Detected at [name]: Society of Radiologists in [place]. Radiology 2005; [id number].   Electronically Signed By: Inez Catalina M.D. On: 07/15/2014 14:36  I reviewed the U/S images >> nodules appear poorly defined and it is not unusual for the nodules to appear while the thyroid activity is increased and resolve when the thyroid activity normalizes. I would suggest repeating the Thyroid U/S in 1 year and Bx the nodules if still present.

## 2014-06-18 NOTE — Progress Notes (Signed)
Patient was seen on 06-18-14 for the second of a series of three diabetes self-management courses at the [facility]. The following learning objectives were met by the patient during this class:   Describe the role of different macronutrients on glucose  Explain how carbohydrates affect blood glucose  State what foods contain the most carbohydrates  Demonstrate carbohydrate counting  Demonstrate how to read Nutrition Facts food label  Describe effects of various fats on heart health  Describe the importance of good nutrition for health and healthy eating strategies  Describe techniques for managing your shopping, cooking and meal planning  List strategies to follow meal plan when dining out  Describe the effects of alcohol on glucose and how to use it safely  Goals:  Follow Diabetes Meal Plan as instructed  Eat 3 meals and 2 snacks, every 3-5 hrs  Limit carbohydrate intake to 45 grams carbohydrate/meal Limit carbohydrate intake to 15 grams carbohydrate/snack Add lean protein foods to meals/snacks  Monitor glucose levels as instructed by your doctor   Follow-Up Plan:  Attend Core 3  Work towards following your personal food plan.    

## 2014-06-18 NOTE — Patient Instructions (Addendum)
Please stop at the lab. I will send you the results through [facility]. We may need an Uptake and Scan, but we will need to see the labs first. Please return in 3 months, but we may need labs sooner.  Hyperthyroidism The thyroid is a large gland located in the lower front part of your neck. The thyroid helps control metabolism. Metabolism is how your body uses food. It controls metabolism with the hormone thyroxine. When the thyroid is overactive, it produces too much hormone. When this happens, these following problems may occur:   Nervousness  Heat intolerance  Weight loss (in spite of increase food intake)  Diarrhea  Change in hair or skin texture  Palpitations (heart skipping or having extra beats)  Tachycardia (rapid heart rate)  Loss of menstruation (amenorrhea)  Shaking of the hands CAUSES  Grave's Disease (the immune system attacks the thyroid gland). This is the most common cause.  Inflammation of the thyroid gland.  Tumor (usually benign) in the thyroid gland or elsewhere.  Excessive use of thyroid medications (both prescription and 'natural').  Excessive ingestion of Iodine. DIAGNOSIS  To prove hyperthyroidism, your caregiver may do blood tests and ultrasound tests. Sometimes the signs are hidden. It may be necessary for your caregiver to watch this illness with blood tests, either before or after diagnosis and treatment. TREATMENT Short-term treatment There are several treatments to control symptoms. Drugs called beta blockers may give some relief. Drugs that decrease hormone production will provide temporary relief in many people. These measures will usually not give permanent relief. Definitive therapy There are treatments available which can be discussed between you and your caregiver which will permanently treat the problem. These treatments range from surgery (removal of the thyroid), to the use of radioactive iodine (destroys the thyroid by radiation), to the  use of antithyroid drugs (interfere with hormone synthesis). The first two treatments are permanent and usually successful. They most often require hormone replacement therapy for life. This is because it is impossible to remove or destroy the exact amount of thyroid required to make a person euthyroid (normal). HOME CARE INSTRUCTIONS  See your caregiver if the problems you are being treated for get worse. Examples of this would be the problems listed above. SEEK MEDICAL CARE IF: Your general condition worsens. MAKE SURE YOU:   Understand these instructions.  Will watch your condition.  Will get help right away if you are not doing well or get worse. Document Released: 10/11/2005 Document Revised: 01/03/2012 Document Reviewed: 02/22/2007 [facility] Patient Information 2015 [place], [place]. This information is not intended to replace advice given to you by your health care provider. Make sure you discuss any questions you have with your health care provider.

## 2014-06-20 ENCOUNTER — Ambulatory Visit: Payer: BC Managed Care – PPO

## 2014-06-23 LAB — THYROID STIMULATING IMMUNOGLOBULIN: TSI: 111 % baseline (ref <140)

## 2014-06-24 ENCOUNTER — Other Ambulatory Visit: Payer: Self-pay | Admitting: Internal Medicine

## 2014-06-24 DIAGNOSIS — E0590 Thyrotoxicosis, unspecified without thyrotoxic crisis or storm: Secondary | ICD-10-CM

## 2014-06-26 ENCOUNTER — Encounter: Payer: Self-pay | Admitting: Internal Medicine

## 2014-06-26 ENCOUNTER — Telehealth: Payer: Self-pay | Admitting: *Deleted

## 2014-06-26 NOTE — Telephone Encounter (Signed)
Scheduled pt's uptake and scan; Monday, Sept 14th and Tuesday, Sept 15th both days arrive at 12:45 pm at [facility]. Called pt and lvm advising her of dates and times. Be advised.

## 2014-07-04 ENCOUNTER — Encounter: Payer: BC Managed Care – PPO | Attending: Family Medicine

## 2014-07-04 DIAGNOSIS — R7309 Other abnormal glucose: Secondary | ICD-10-CM | POA: Insufficient documentation

## 2014-07-04 DIAGNOSIS — Z713 Dietary counseling and surveillance: Secondary | ICD-10-CM | POA: Diagnosis present

## 2014-07-04 DIAGNOSIS — E119 Type 2 diabetes mellitus without complications: Secondary | ICD-10-CM

## 2014-07-08 ENCOUNTER — Ambulatory Visit (HOSPITAL_COMMUNITY): Payer: BC Managed Care – PPO

## 2014-07-09 ENCOUNTER — Ambulatory Visit (HOSPITAL_COMMUNITY): Payer: BC Managed Care – PPO

## 2014-07-09 ENCOUNTER — Encounter (HOSPITAL_COMMUNITY)
Admission: RE | Admit: 2014-07-09 | Discharge: 2014-07-09 | Disposition: A | Discharge status: Home / Self Care | Payer: BC Managed Care – PPO | Source: Ambulatory Visit | Attending: Internal Medicine | Admitting: Internal Medicine

## 2014-07-09 DIAGNOSIS — E0590 Thyrotoxicosis, unspecified without thyrotoxic crisis or storm: Secondary | ICD-10-CM | POA: Insufficient documentation

## 2014-07-09 NOTE — Progress Notes (Signed)
Patient was seen on 07/04/14 for the third of a series of three diabetes self-management courses at the [facility]. The following learning objectives were met by the patient during this class:    State the amount of activity recommended for healthy living   Describe activities suitable for individual needs   Identify ways to regularly incorporate activity into daily life   Identify barriers to activity and ways to over come these barriers  Identify diabetes medications being personally used and their primary action for lowering glucose and possible side effects   Describe role of stress on blood glucose and develop strategies to address psychosocial issues   Identify diabetes complications and ways to prevent them  Explain how to manage diabetes during illness   Evaluate success in meeting personal goal   Establish 2-3 goals that they will plan to diligently work on until they return for the  47-monthfollow-up visit  Goals:  Follow Diabetes Meal Plan as instructed  Aim for 15-30 mins of physical activity daily as tolerated  Bring food record and glucose log to your follow up visit  Your patient has established the following 4 month goals in their individualized success plan: I will count my carb choices at most meals and snacks Need to not binge I will be active 15 minutes or more 3 times a week I will take my diabetes medications as scheduled To help manage stress I will  Plan my meals at least 7 times a week  Your patient has identified these potential barriers to change:  Stress  Your patient has identified their diabetes self-care support plan as  [facility]  Family support  On-line resources  Plan:  Attend Core 4 in 4 months

## 2014-07-10 ENCOUNTER — Encounter (HOSPITAL_COMMUNITY)
Admission: RE | Admit: 2014-07-10 | Discharge: 2014-07-10 | Disposition: A | Discharge status: Home / Self Care | Payer: BC Managed Care – PPO | Source: Ambulatory Visit | Attending: Internal Medicine | Admitting: Internal Medicine

## 2014-07-10 DIAGNOSIS — E0590 Thyrotoxicosis, unspecified without thyrotoxic crisis or storm: Secondary | ICD-10-CM | POA: Diagnosis not present

## 2014-07-10 MED ORDER — SODIUM IODIDE I 131 CAPSULE
7.7000 | Freq: Once | INTRAVENOUS | Status: AC | PRN
  Administered 2014-07-10: 7.7 via ORAL

## 2014-07-10 MED ORDER — SODIUM PERTECHNETATE TC 99M INJECTION
11.0000 | Freq: Once | INTRAVENOUS | Status: AC | PRN
  Administered 2014-07-10: 11 via INTRAVENOUS

## 2014-07-11 ENCOUNTER — Encounter: Payer: Self-pay | Admitting: Internal Medicine

## 2014-07-11 NOTE — Addendum Note (Signed)
Addended by: Philemon Kingdom on: 07/11/2014 05:05 PM   Modules accepted: Orders

## 2014-07-15 ENCOUNTER — Telehealth: Payer: Self-pay | Admitting: Family Medicine

## 2014-07-15 ENCOUNTER — Ambulatory Visit
Admission: RE | Admit: 2014-07-15 | Discharge: 2014-07-15 | Disposition: A | Discharge status: Home / Self Care | Payer: BC Managed Care – PPO | Source: Ambulatory Visit | Attending: Internal Medicine | Admitting: Internal Medicine

## 2014-07-15 NOTE — Telephone Encounter (Signed)
I have electronically sent pt's info for insurance verification for Prolia and will notify you as soon as I have a response. Thank you.

## 2014-07-17 ENCOUNTER — Encounter: Payer: Self-pay | Admitting: Internal Medicine

## 2014-07-18 ENCOUNTER — Encounter: Payer: Self-pay | Admitting: Internal Medicine

## 2014-07-31 NOTE — Telephone Encounter (Signed)
P/A faxed to your attn. Thank you.

## 2014-07-31 NOTE — Telephone Encounter (Signed)
I have rec'd Alison Best, but [employer] is requiring a prior authorization.  I will complete as much of the p/a form as possible then fax to you for Dr. Cruzita Lederer to complete and sign.  Once completed and signed you can fax back to me at [phone].  I will also need clinicals to send w/the p/a form and you can either tell me what date to print so I can print them myself or you can print them and fax with the p/a form. If you have any questions, please let me know. Thank you.

## 2014-08-01 NOTE — Telephone Encounter (Signed)
Please disregard this request. Dr Cruzita Lederer accidentally sent me a request for a PA for this pt to have the Prolia inj. She only sees this pt for her thyroid issues. Thank you.

## 2014-08-05 NOTE — Telephone Encounter (Signed)
Thank you for letting me know

## 2014-09-16 ENCOUNTER — Encounter: Payer: Self-pay | Admitting: Family Medicine

## 2014-09-16 ENCOUNTER — Encounter: Payer: Self-pay | Admitting: Internal Medicine

## 2014-09-16 ENCOUNTER — Ambulatory Visit (INDEPENDENT_AMBULATORY_CARE_PROVIDER_SITE_OTHER): Payer: BC Managed Care – PPO | Admitting: Internal Medicine

## 2014-09-16 VITALS — BP 114/68 | HR 92 | Temp 98.8°F | Resp 12 | Wt 217.0 lb

## 2014-09-16 DIAGNOSIS — R7303 Prediabetes: Secondary | ICD-10-CM

## 2014-09-16 DIAGNOSIS — E0590 Thyrotoxicosis, unspecified without thyrotoxic crisis or storm: Secondary | ICD-10-CM

## 2014-09-16 DIAGNOSIS — R7309 Other abnormal glucose: Secondary | ICD-10-CM

## 2014-09-16 LAB — T4, FREE: Free T4: 1.14 ng/dL (ref 0.60–1.60)

## 2014-09-16 LAB — HEMOGLOBIN A1C: HEMOGLOBIN A1C: 5.9 % (ref 4.6–6.5)

## 2014-09-16 LAB — TSH: TSH: 0.02 u[IU]/mL — ABNORMAL LOW (ref 0.35–4.50)

## 2014-09-16 LAB — T3, FREE: T3, Free: 4.4 pg/mL — ABNORMAL HIGH (ref 2.3–4.2)

## 2014-09-16 NOTE — Progress Notes (Signed)
Patient ID: REMMI ARMENTEROS, female   DOB: Mar 07, 1956, 58 y.o.   MRN: [id number]   HPI  Alison Best is a 58 y.o.-year-old female, returning for f/u for subclinical thyrotoxicosis and MNG and also prediabetes.  Reviewed hx: The thyroid tests were checked as she complained of neck pressure. She still feels this occasionally (upper neck).  I reviewed pt's thyroid tests: Component     Latest Ref Rng 07/05/2011 07/28/2012 04/22/2014 05/23/2014  TSH     0.35 - 4.50 uIU/mL 1.68 1.51 0.04 (L) 0.02 (L)  T3, Free     2.3 - 4.2 pg/mL   4.4 (H)   Free T4     0.60 - 1.60 ng/dL   1.12     Component     Latest Ref Rng 06/18/2014  TSH     0.35 - 4.50 uIU/mL 0.02 (L)  T3, Free     2.3 - 4.2 pg/mL 3.1  Free T4     0.60 - 1.60 ng/dL 0.97  TSI     <140 % baseline 111   07/10/2014 Uptake and scan: The 24 hr uptake by the thyroid gland is 28.5%. Normal 24 hr uptakeis 10-30 %. On thyroid imaging, the thyroid activity appears mildly heterogeneous in the right lobe with a possible cold nodule inferiorly. The left lobe activity appears homogeneous.  07/15/2014 Thyroid U/S: Multiple bilateral thyroid nodules. The dominant nodules bilateral meet criteria for percutaneous biopsy. >> nodules appeared poorly defined and it is not unusual for the nodules to appear while the thyroid activity is increased and resolve when the thyroid activity normalizes >> decided to repeat the Thyroid U/S in 1 year and Bx the nodules if still present.  Pt denies feeling nodules in neck, no hoarseness, no dysphagia occasionally/no odynophagia, no SOB with lying down.  She also c/o: - + heat intolerance - hot flushes- no tremors - no anxiety - no palpitations  - no problems with concentration - no fatigue - no hyperdefecation/constipation - has diverticulitis - lost weight lately  She has a recent dx of prediabetes (a hemoglobin A1c obtained in 05/2014 was 6.4%). She was started on Metformin 500 mg 2x a day by PCP >>  she feels better on this. She is not checking her sugars at home, does not have a glucometer.  ROS: Constitutional: see HPI Eyes: no blurry vision, no xerophthalmia ENT: no sore throat, + see HPI Cardiovascular: no CP/SOB/palpitations/leg swelling Respiratory: + cough/no SOB Gastrointestinal: no N/V/D/C Musculoskeletal: no muscle/joint aches Skin:no rash Neurological: no tremors/numbness/tingling/dizziness  I reviewed pt's medications, allergies, PMH, social hx, family hx and no changes required, except as mentioned above.  PE: BP 114/68 mmHg  Pulse 92  Temp(Src) 98.8 F (37.1 C) (Oral)  Resp 12  Wt 217 lb (98.431 kg)  SpO2 97%  LMP 02/23/2012  Wt Readings from Last 3 Encounters:  09/16/14 217 lb (98.431 kg)  06/18/14 223 lb (101.152 kg)  06/06/14 221 lb (100.245 kg)   Constitutional: overweight, in NAD Eyes: PERRLA, EOMI, no exophthalmos, no lid lag, no stare ENT: moist mucous membranes, no thyromegaly, no thyroid bruits, no cervical lymphadenopathy Cardiovascular: RRR, No MRG Respiratory: CTA B Gastrointestinal: abdomen soft, NT, ND, BS+ Musculoskeletal: no deformities, strength intact in all 4 Skin: moist, warm, no rashes Neurological: no tremor with outstretched hands, DTR normal in all 4  ASSESSMENT: 1. Thyrotoxycosis  07/10/2014: Uptake and scan: 24 hr uptake by the thyroid gland is 28.5%. Normal 24 hr uptake is 10-30 %. On thyroid  imaging, the thyroid activity appears mildly heterogeneous in the right lobe with a possible cold nodule inferiorly. The left lobe activity appears homogeneous.  2. Multinodular goiter  07/15/2014 thyroid ultrasound:  Right thyroid lobe: 4.5 x 1.9 x 1.9 cm. Multiple nodules are noted throughout the right lobe of the thyroid. There is diffuse irregular margined hypoechoic nodule measuring 16 mm in the upper pole. Multiple smaller nodules are identified.  Left thyroid lobe: 4.4 x 1.7 x 1.9 cm. Multiple hypoechoic nodules are noted  within the left lobe of the thyroid. The largest of these measures 15 mm in greatest dimension lying within the lower pole  Isthmus Thickness: 0.5 cm. No nodules visualized.  Lymphadenopathy None visualized.  IMPRESSION: Multiple bilateral thyroid nodules. The dominant nodules bilateral meet criteria for percutaneous biopsy.   3. Prediabetes  - Patient would want me to manage this, also   PLAN:  1. Patient with a history of subclinical hyperthyroidism, initially with few possible thyrotoxic sxs: heat intolerance, palpitations, which have improved over the last year. - she does not appear to have exogenous causes for the low TSH, however she brought to my attention the fact that she uses a vibrating facial cleansing brush, which she bought this summer. She is wondering whether this has a connection with her low thyroid tests. She started to use this less often and daily, every 3 days - Her thyroid uptake and scan was negative for thyroiditis but she had some nodules in the right lobe which could have been slightly overactive - we will check the TSH, fT3 and fT4 today  2. Multinodular goiter - We again reviewed the reports of her previous thyroid ultrasound that showed several nodules, with the largest one being eligible for FNA. However, she was hyper thyroid at the time of her ultrasound, and it is possible that the nodules were inflammatory. Therefore, we decided to repeat the thyroid ultrasound in one year after the previous (September 2016). She agrees with the plan.  3. Prediabetes - We reviewed together her recent hemoglobin A1c from 3 months ago. This was borderline between prediabetes and diabetes. - We will recheck her hemoglobin A1c today - Advised her to continue metformin 500 mg twice a day with meals - Given instructions about hypoglycemia management - Given instructions about foot care in patients with diabetes - Given a One Touch Verio meter, and demonstrated use - Advised  her to check her sugars every day or every other day, rotating check times - We'll repeat her hemoglobin A1c when she comes back in 6 months  Return in about 6 months (around 03/17/2015).  CC ObGyn: Dr Delila Pereyra  - time spent with the patient: 40 min, of which >50% was spent in obtaining information about her symptoms, reviewing her previous labs, evaluations, and treatments, counseling her about her conditions (please see the discussed topics above), and developing a plan to further investigate them. she had a number of questions which I addressed.   Office Visit on 09/16/2014  Component Date Value Ref Range Status  . TSH 09/16/2014 0.02* 0.35 - 4.50 uIU/mL Final  . Free T4 09/16/2014 1.14  0.60 - 1.60 ng/dL Final  . T3, Free 09/16/2014 4.4* 2.3 - 4.2 pg/mL Final  . Hgb A1c MFr Bld 09/16/2014 5.9  4.6 - 6.5 % Final   Glycemic Control Guidelines for People with Diabetes:Non Diabetic:  <6%Goal of Therapy: <7%Additional Action Suggested:  >8%    A1c better. TFTs not better. fT4 slightly high. Unusual, since  her thyroid uptake was normal.  I called and discussed with the patient about further plan. I exposed 2 possibilities: - To biopsy the cold nodule now and then treat with radioactive iodine - To start methimazole low-dose and follow the thyroid tests and if they do not normalize after we tapered methimazole to off, we can repeat the uptake and scan and see if there is still a need for radioactive iodine treatment We'll need to repeat the thyroid ultrasound in about 10 months anyway. She chose the second plan, therefore, will start methimazole 5 mg daily in a.m. and check labs again in 2 months. Advised her about possible side effect of methimazole and when she should stop the drug and call us.

## 2014-09-16 NOTE — Patient Instructions (Signed)
Please stop at the lab. Continue Metformin 500 mg 2x a day with meals.  Please come back for a follow-up appointment in 6 months.

## 2014-09-17 ENCOUNTER — Telehealth: Payer: Self-pay | Admitting: Internal Medicine

## 2014-09-17 ENCOUNTER — Other Ambulatory Visit: Payer: Self-pay | Admitting: Family Medicine

## 2014-09-17 NOTE — Telephone Encounter (Signed)
Call in Cipro 500 mg bid #20  

## 2014-09-17 NOTE — Telephone Encounter (Signed)
Did you call pt, Dr Cruzita Lederer?

## 2014-09-17 NOTE — Telephone Encounter (Signed)
Patient states she had missed a call from our office and she was returning the call    Please advise patient    Thank you

## 2014-09-17 NOTE — Telephone Encounter (Signed)
Yes, I did >> will call her later today or tomorrow am to discuss her results.

## 2014-09-18 ENCOUNTER — Encounter: Payer: Self-pay | Admitting: Family Medicine

## 2014-09-18 ENCOUNTER — Encounter: Payer: Self-pay | Admitting: Internal Medicine

## 2014-09-18 MED ORDER — METHIMAZOLE 5 MG PO TABS: 5.0000 mg | ORAL_TABLET | Freq: Every day | ORAL | Status: DC

## 2014-09-18 MED ORDER — METRONIDAZOLE 500 MG PO TABS: 500.0000 mg | ORAL_TABLET | Freq: Two times a day (BID) | ORAL | Status: DC

## 2014-09-18 MED ORDER — CIPROFLOXACIN HCL 500 MG PO TABS: 500.0000 mg | ORAL_TABLET | Freq: Two times a day (BID) | ORAL | Status: DC

## 2014-09-18 NOTE — Telephone Encounter (Signed)
We called in Cipro yesterday. Call in Flagyl 50 mg bid #20

## 2014-12-05 ENCOUNTER — Telehealth: Payer: Self-pay | Admitting: Family Medicine

## 2014-12-05 NOTE — Telephone Encounter (Signed)
Pt states she will be traveling for 2 months and due to the fact she has occasional  diverticulitis flairs, pt would like a Rx for 2 rounds of metroNIDAZOLE (FLAGYL) 500 MG tablet and amoxicillin-clavulanate (AUGMENTIN) 875-125 MG per tablet (the cipro made her sick last time)  [facility]/ [address]

## 2014-12-09 MED ORDER — AMOXICILLIN-POT CLAVULANATE 875-125 MG PO TABS: 1.0000 | ORAL_TABLET | Freq: Two times a day (BID) | ORAL | Status: DC

## 2014-12-09 MED ORDER — METRONIDAZOLE 500 MG PO TABS: 500.0000 mg | ORAL_TABLET | Freq: Two times a day (BID) | ORAL | Status: DC

## 2014-12-09 NOTE — Telephone Encounter (Signed)
done

## 2015-01-27 ENCOUNTER — Other Ambulatory Visit: Payer: Self-pay | Admitting: Internal Medicine

## 2015-02-18 ENCOUNTER — Emergency Department (HOSPITAL_COMMUNITY): Payer: BLUE CROSS/BLUE SHIELD

## 2015-02-18 ENCOUNTER — Inpatient Hospital Stay (HOSPITAL_COMMUNITY)
Admission: EM | Admit: 2015-02-18 | Discharge: 2015-03-06 | DRG: 330 | Disposition: A | Discharge status: Rehabilitation Facility | Payer: BLUE CROSS/BLUE SHIELD | Attending: Surgery | Admitting: Surgery

## 2015-02-18 ENCOUNTER — Encounter (HOSPITAL_COMMUNITY): Payer: Self-pay | Admitting: Emergency Medicine

## 2015-02-18 ENCOUNTER — Telehealth: Payer: Self-pay | Admitting: Family Medicine

## 2015-02-18 DIAGNOSIS — T814XXS Infection following a procedure, sequela: Secondary | ICD-10-CM

## 2015-02-18 DIAGNOSIS — Z881 Allergy status to other antibiotic agents status: Secondary | ICD-10-CM | POA: Diagnosis not present

## 2015-02-18 DIAGNOSIS — D62 Acute posthemorrhagic anemia: Secondary | ICD-10-CM | POA: Diagnosis not present

## 2015-02-18 DIAGNOSIS — E0590 Thyrotoxicosis, unspecified without thyrotoxic crisis or storm: Secondary | ICD-10-CM | POA: Diagnosis present

## 2015-02-18 DIAGNOSIS — R011 Cardiac murmur, unspecified: Secondary | ICD-10-CM | POA: Diagnosis present

## 2015-02-18 DIAGNOSIS — R1084 Generalized abdominal pain: Secondary | ICD-10-CM

## 2015-02-18 DIAGNOSIS — F419 Anxiety disorder, unspecified: Secondary | ICD-10-CM | POA: Diagnosis present

## 2015-02-18 DIAGNOSIS — Z87891 Personal history of nicotine dependence: Secondary | ICD-10-CM | POA: Diagnosis not present

## 2015-02-18 DIAGNOSIS — K631 Perforation of intestine (nontraumatic): Secondary | ICD-10-CM | POA: Diagnosis present

## 2015-02-18 DIAGNOSIS — E46 Unspecified protein-calorie malnutrition: Secondary | ICD-10-CM | POA: Diagnosis present

## 2015-02-18 DIAGNOSIS — E119 Type 2 diabetes mellitus without complications: Secondary | ICD-10-CM | POA: Diagnosis present

## 2015-02-18 DIAGNOSIS — E785 Hyperlipidemia, unspecified: Secondary | ICD-10-CM | POA: Diagnosis present

## 2015-02-18 DIAGNOSIS — K651 Peritoneal abscess: Secondary | ICD-10-CM

## 2015-02-18 DIAGNOSIS — Z91013 Allergy to seafood: Secondary | ICD-10-CM

## 2015-02-18 DIAGNOSIS — R109 Unspecified abdominal pain: Secondary | ICD-10-CM

## 2015-02-18 DIAGNOSIS — Z79899 Other long term (current) drug therapy: Secondary | ICD-10-CM | POA: Diagnosis not present

## 2015-02-18 DIAGNOSIS — Z6835 Body mass index (BMI) 35.0-35.9, adult: Secondary | ICD-10-CM

## 2015-02-18 DIAGNOSIS — Z9889 Other specified postprocedural states: Secondary | ICD-10-CM

## 2015-02-18 DIAGNOSIS — Z91018 Allergy to other foods: Secondary | ICD-10-CM | POA: Diagnosis not present

## 2015-02-18 DIAGNOSIS — N739 Female pelvic inflammatory disease, unspecified: Secondary | ICD-10-CM

## 2015-02-18 DIAGNOSIS — IMO0001 Reserved for inherently not codable concepts without codable children: Secondary | ICD-10-CM

## 2015-02-18 DIAGNOSIS — N390 Urinary tract infection, site not specified: Secondary | ICD-10-CM | POA: Diagnosis present

## 2015-02-18 DIAGNOSIS — B952 Enterococcus as the cause of diseases classified elsewhere: Secondary | ICD-10-CM | POA: Diagnosis present

## 2015-02-18 DIAGNOSIS — R350 Frequency of micturition: Secondary | ICD-10-CM | POA: Diagnosis not present

## 2015-02-18 DIAGNOSIS — D689 Coagulation defect, unspecified: Secondary | ICD-10-CM | POA: Diagnosis not present

## 2015-02-18 DIAGNOSIS — K567 Ileus, unspecified: Secondary | ICD-10-CM | POA: Diagnosis not present

## 2015-02-18 DIAGNOSIS — E669 Obesity, unspecified: Secondary | ICD-10-CM | POA: Diagnosis present

## 2015-02-18 DIAGNOSIS — R100 Acute abdomen: Secondary | ICD-10-CM

## 2015-02-18 DIAGNOSIS — G4730 Sleep apnea, unspecified: Secondary | ICD-10-CM | POA: Diagnosis present

## 2015-02-18 DIAGNOSIS — K5720 Diverticulitis of large intestine with perforation and abscess without bleeding: Principal | ICD-10-CM | POA: Diagnosis present

## 2015-02-18 DIAGNOSIS — R1032 Left lower quadrant pain: Secondary | ICD-10-CM

## 2015-02-18 HISTORY — DX: Unspecified ectopic pregnancy without intrauterine pregnancy: O00.90

## 2015-02-18 HISTORY — DX: Cardiac arrhythmia, unspecified: I49.9

## 2015-02-18 HISTORY — DX: Disorder of thyroid, unspecified: E07.9

## 2015-02-18 HISTORY — DX: Bursopathy, unspecified: M71.9

## 2015-02-18 LAB — CBC WITH DIFFERENTIAL/PLATELET
Basophils Absolute: 0 10*3/uL (ref 0.0–0.1)
Basophils Relative: 0 % (ref 0–1)
Eosinophils Absolute: 0 10*3/uL (ref 0.0–0.7)
Eosinophils Relative: 0 % (ref 0–5)
HCT: 41.2 % (ref 36.0–46.0)
Hemoglobin: 13.8 g/dL (ref 12.0–15.0)
LYMPHS ABS: 3.4 10*3/uL (ref 0.7–4.0)
Lymphocytes Relative: 21 % (ref 12–46)
MCH: 27.2 pg (ref 26.0–34.0)
MCHC: 33.5 g/dL (ref 30.0–36.0)
MCV: 81.3 fL (ref 78.0–100.0)
MONOS PCT: 11 % (ref 3–12)
Monocytes Absolute: 1.8 10*3/uL — ABNORMAL HIGH (ref 0.1–1.0)
NEUTROS PCT: 68 % (ref 43–77)
Neutro Abs: 11 10*3/uL — ABNORMAL HIGH (ref 1.7–7.7)
Platelets: 607 10*3/uL — ABNORMAL HIGH (ref 150–400)
RBC: 5.07 MIL/uL (ref 3.87–5.11)
RDW: 14.4 % (ref 11.5–15.5)
WBC: 16.2 10*3/uL — ABNORMAL HIGH (ref 4.0–10.5)

## 2015-02-18 LAB — COMPREHENSIVE METABOLIC PANEL
ALBUMIN: 3.1 g/dL — AB (ref 3.5–5.2)
ALT: 61 U/L — ABNORMAL HIGH (ref 0–35)
ANION GAP: 14 (ref 5–15)
AST: 62 U/L — AB (ref 0–37)
Alkaline Phosphatase: 120 U/L — ABNORMAL HIGH (ref 39–117)
BILIRUBIN TOTAL: 0.9 mg/dL (ref 0.3–1.2)
BUN: 18 mg/dL (ref 6–23)
CALCIUM: 10 mg/dL (ref 8.4–10.5)
CO2: 24 mmol/L (ref 19–32)
Chloride: 98 mmol/L (ref 96–112)
Creatinine, Ser: 0.69 mg/dL (ref 0.50–1.10)
GFR calc Af Amer: >90 mL/min (ref >90)
GLUCOSE: 105 mg/dL — AB (ref 70–99)
POTASSIUM: 3.4 mmol/L — AB (ref 3.5–5.1)
SODIUM: 136 mmol/L (ref 135–145)
Total Protein: 7.4 g/dL (ref 6.0–8.3)

## 2015-02-18 LAB — URINALYSIS, ROUTINE W REFLEX MICROSCOPIC
Glucose, UA: NEGATIVE mg/dL
HGB URINE DIPSTICK: NEGATIVE
KETONES UR: 15 mg/dL — AB
Nitrite: NEGATIVE
PH: 6 (ref 5.0–8.0)
Protein, ur: 30 mg/dL — AB
SPECIFIC GRAVITY, URINE: 1.027 (ref 1.005–1.030)
Urobilinogen, UA: 1 mg/dL (ref 0.0–1.0)

## 2015-02-18 LAB — URINE MICROSCOPIC-ADD ON

## 2015-02-18 LAB — LIPASE, BLOOD: LIPASE: 26 U/L (ref 11–59)

## 2015-02-18 LAB — I-STAT CG4 LACTIC ACID, ED: LACTIC ACID, VENOUS: 2.68 mmol/L — AB (ref 0.5–2.0)

## 2015-02-18 MED ORDER — SODIUM CHLORIDE 0.9 % IV SOLN
INTRAVENOUS | Status: DC
  Administered 2015-02-18: 15:00:00 via INTRAVENOUS

## 2015-02-18 MED ORDER — ONDANSETRON HCL 4 MG/2ML IJ SOLN
4.0000 mg | Freq: Once | INTRAMUSCULAR | Status: AC
  Administered 2015-02-18: 4 mg via INTRAVENOUS
  Filled 2015-02-18: qty 2

## 2015-02-18 MED ORDER — IOHEXOL 300 MG/ML  SOLN
50.0000 mL | Freq: Once | INTRAMUSCULAR | Status: AC | PRN
  Administered 2015-02-18: 50 mL via ORAL

## 2015-02-18 MED ORDER — PIPERACILLIN-TAZOBACTAM 3.375 G IVPB
3.3750 g | Freq: Three times a day (TID) | INTRAVENOUS | Status: DC
  Administered 2015-02-18 – 2015-02-20 (×7): 3.375 g via INTRAVENOUS
  Filled 2015-02-18 (×8): qty 50

## 2015-02-18 MED ORDER — ALBUTEROL SULFATE HFA 108 (90 BASE) MCG/ACT IN AERS: 2.0000 | INHALATION_SPRAY | RESPIRATORY_TRACT | Status: DC | PRN

## 2015-02-18 MED ORDER — ONDANSETRON HCL 4 MG/2ML IJ SOLN
4.0000 mg | INTRAMUSCULAR | Status: DC | PRN
  Administered 2015-02-18 – 2015-02-20 (×5): 4 mg via INTRAVENOUS
  Filled 2015-02-18 (×4): qty 2

## 2015-02-18 MED ORDER — PANTOPRAZOLE SODIUM 40 MG IV SOLR
40.0000 mg | Freq: Every day | INTRAVENOUS | Status: DC
  Administered 2015-02-18 – 2015-02-19 (×2): 40 mg via INTRAVENOUS
  Filled 2015-02-18 (×3): qty 40

## 2015-02-18 MED ORDER — POTASSIUM CHLORIDE 2 MEQ/ML IV SOLN
INTRAVENOUS | Status: DC
  Administered 2015-02-19 – 2015-02-20 (×3): via INTRAVENOUS
  Filled 2015-02-18 (×9): qty 1000

## 2015-02-18 MED ORDER — SODIUM CHLORIDE 0.9 % IV BOLUS (SEPSIS)
1000.0000 mL | Freq: Once | INTRAVENOUS | Status: AC
  Administered 2015-02-18: 1000 mL via INTRAVENOUS

## 2015-02-18 MED ORDER — ALBUTEROL SULFATE (2.5 MG/3ML) 0.083% IN NEBU: 2.5000 mg | INHALATION_SOLUTION | RESPIRATORY_TRACT | Status: DC | PRN

## 2015-02-18 MED ORDER — MORPHINE SULFATE 2 MG/ML IJ SOLN
2.0000 mg | INTRAMUSCULAR | Status: DC | PRN
  Administered 2015-02-18 – 2015-02-20 (×10): 2 mg via INTRAVENOUS
  Filled 2015-02-18 (×12): qty 1

## 2015-02-18 MED ORDER — SODIUM CHLORIDE 0.9 % IV SOLN: 3.0000 g | Freq: Once | INTRAVENOUS | Status: DC

## 2015-02-18 MED ORDER — IOHEXOL 300 MG/ML  SOLN
100.0000 mL | Freq: Once | INTRAMUSCULAR | Status: AC | PRN
  Administered 2015-02-18: 100 mL via INTRAVENOUS

## 2015-02-18 NOTE — ED Notes (Signed)
[facility]CAR RN TRANSPORT THIS PT

## 2015-02-18 NOTE — ED Notes (Signed)
Patient states she returned last night from 2 months of heavy international travel. Patient c/o feeling like her heart is irregular and having palpitation for about 4-5 weeks intermittently. Patient states she called her PCP today and was advised to come to the ER. Patient states she had a diverticulitis flare up while on a cruise, was told at that time her heartbeat was irregular. C/O intermittent fever highest 100.5. States she has not been eating regularly. Patient recently stopped smoking. Patient states she took 1 Celebrex this am for her discomfort.

## 2015-02-18 NOTE — Telephone Encounter (Signed)
Noted  

## 2015-02-18 NOTE — Progress Notes (Signed)
Utilization Review completed.  Francisco Ostrovsky RN CM  

## 2015-02-18 NOTE — H&P (Signed)
Alison Best is an 59 y.o. female.   Chief Complaint: Persistent lower abdominal pain and fever HPI: This is a 59 year old female with a history of 6-7 bouts of diverticulitis in the past. About 2 weeks ago, while on a cruise ship, she noted some lower abdominal discomfort and a ribbon like stools. The discomfort persisted and she felt weak. This ship's Dr. felt she may have diverticulitis and gave her some Flagyl, which she could not keep down, and amoxicillin. She just arrived back in the states and has not been doing better. In fact the pain is increasing and she has fever. She presented to the emergency department and was noted to have a leukocytosis. CT scan was performed demonstrating findings consistent with acute sigmoid diverticulitis with 2 separate abscesses, one next to the sigmoid colon and one in the pelvis area. We were asked to see her because of this.  Past Medical History  Diagnosis Date  . Diverticulitis of colon   . Obesity   . Hyperlipidemia   . Sleep apnea   . Heart murmur   . Irregular heartbeat   . Thyroid disease     hyperthyroid  . Bursitis   . Ectopic pregnancy     Type II DM  Past Surgical History  Procedure Laterality Date  . Colonoscopy  09-10-10    per Dr. Acquanetta Sit, diverticulosis of descending colon and sigmoid, internal hemorrhoids, repeat in 10 yrs  . Cesarean section    . Appendectomy    . Tonsillectomy  1980  . Tonsillectomy      Family History  Problem Relation Age of Onset  . Cancer Mother   . COPD Other   . Allergies Brother   . Heart disease Father   . Cancer Maternal Grandmother   . Skin cancer Father    Social History:  reports that she quit smoking 11 days ago. Her smoking use included Cigarettes. She smoked 0.50 packs per day. She has never used smokeless tobacco. She reports that she drinks alcohol. She reports that she does not use illicit drugs.  Allergies:  Allergies  Allergen Reactions  . Mango Butter Anaphylaxis     Mango  . Levaquin [Levofloxacin In D5w] Other (See Comments)    Achilles tendon weakness  . Scallops [Shellfish Allergy] Nausea And Vomiting  . Soy Allergy     Came up on allergy test.   . Tomato Other (See Comments)    Came up on allergy test.   Prior to Admission medications   Medication Sig Start Date End Date Taking? Authorizing Provider  acetaminophen (TYLENOL) 500 MG tablet Take 1,000 mg by mouth every 6 (six) hours as needed for moderate pain or headache.   Yes Historical Provider, MD  albuterol (PROAIR HFA) 108 (90 BASE) MCG/ACT inhaler Inhale 2 puffs into the lungs every 4 (four) hours as needed for wheezing. 04/22/14 05/24/16 Yes Laurey Morale, MD  celecoxib (CELEBREX) 200 MG capsule Take 200 mg by mouth daily.   Yes Historical Provider, MD  EPINEPHrine 0.3 mg/0.3 mL IJ SOAJ injection Inject 0.3 mLs (0.3 mg total) into the muscle once. 04/22/14  Yes Laurey Morale, MD  fenofibrate 160 MG tablet Take 1 tablet (160 mg total) by mouth daily. 05/28/14 06/22/15 Yes Laurey Morale, MD  furosemide (LASIX) 20 MG tablet TAKE 1 TABLET BY MOUTH DAILY AS NEEDED (FLUID) 05/28/14  Yes Laurey Morale, MD  metFORMIN (GLUCOPHAGE) 500 MG tablet Take 1 tablet (500 mg total) by mouth  2 (two) times daily with a meal. 05/28/14  Yes Laurey Morale, MD  methimazole (TAPAZOLE) 5 MG tablet TAKE 1 TABLET BY MOUTH DAILY 01/27/15  Yes Philemon Kingdom, MD  amoxicillin-clavulanate (AUGMENTIN) 875-125 MG per tablet Take 1 tablet by mouth 2 (two) times daily. Patient not taking: Reported on 02/18/2015 12/09/14   Laurey Morale, MD  metroNIDAZOLE (FLAGYL) 500 MG tablet Take 1 tablet (500 mg total) by mouth 2 (two) times daily. Patient not taking: Reported on 02/18/2015 12/09/14   Laurey Morale, MD     (Not in a hospital admission)  Results for orders placed or performed during the hospital encounter of 02/18/15 (from the past 48 hour(s))  Urinalysis, Routine w reflex microscopic     Status: Abnormal   Collection Time: 02/18/15   1:41 PM  Result Value Ref Range   Color, Urine ORANGE (A) YELLOW    Comment: BIOCHEMICALS MAY BE AFFECTED BY COLOR   APPearance TURBID (A) CLEAR   Specific Gravity, Urine 1.027 1.005 - 1.030   pH 6.0 5.0 - 8.0   Glucose, UA NEGATIVE NEGATIVE mg/dL   Hgb urine dipstick NEGATIVE NEGATIVE   Bilirubin Urine LARGE (A) NEGATIVE   Ketones, ur 15 (A) NEGATIVE mg/dL   Protein, ur 30 (A) NEGATIVE mg/dL   Urobilinogen, UA 1.0 0.0 - 1.0 mg/dL   Nitrite NEGATIVE NEGATIVE   Leukocytes, UA SMALL (A) NEGATIVE  Urine microscopic-add on     Status: Abnormal   Collection Time: 02/18/15  1:41 PM  Result Value Ref Range   Squamous Epithelial / LPF MANY (A) RARE   WBC, UA 21-50 <3 WBC/hpf   RBC / HPF 3-6 <3 RBC/hpf   Bacteria, UA MANY (A) RARE   Casts HYALINE CASTS (A) NEGATIVE   Crystals CA OXALATE CRYSTALS (A) NEGATIVE   Urine-Other MUCOUS PRESENT   CBC with Differential/Platelet     Status: Abnormal   Collection Time: 02/18/15  1:54 PM  Result Value Ref Range   WBC 16.2 (H) 4.0 - 10.5 K/uL   RBC 5.07 3.87 - 5.11 MIL/uL   Hemoglobin 13.8 12.0 - 15.0 g/dL   HCT 41.2 36.0 - 46.0 %   MCV 81.3 78.0 - 100.0 fL   MCH 27.2 26.0 - 34.0 pg   MCHC 33.5 30.0 - 36.0 g/dL   RDW 14.4 11.5 - 15.5 %   Platelets 607 (H) 150 - 400 K/uL   Neutrophils Relative % 68 43 - 77 %   Lymphocytes Relative 21 12 - 46 %   Monocytes Relative 11 3 - 12 %   Eosinophils Relative 0 0 - 5 %   Basophils Relative 0 0 - 1 %   Neutro Abs 11.0 (H) 1.7 - 7.7 K/uL   Lymphs Abs 3.4 0.7 - 4.0 K/uL   Monocytes Absolute 1.8 (H) 0.1 - 1.0 K/uL   Eosinophils Absolute 0.0 0.0 - 0.7 K/uL   Basophils Absolute 0.0 0.0 - 0.1 K/uL   Smear Review LARGE PLATELETS PRESENT   Comprehensive metabolic panel     Status: Abnormal   Collection Time: 02/18/15  1:54 PM  Result Value Ref Range   Sodium 136 135 - 145 mmol/L   Potassium 3.4 (L) 3.5 - 5.1 mmol/L   Chloride 98 96 - 112 mmol/L   CO2 24 19 - 32 mmol/L   Glucose, Bld 105 (H) 70 - 99 mg/dL    BUN 18 6 - 23 mg/dL   Creatinine, Ser 0.69 0.50 - 1.10 mg/dL  Calcium 10.0 8.4 - 10.5 mg/dL   Total Protein 7.4 6.0 - 8.3 g/dL   Albumin 3.1 (L) 3.5 - 5.2 g/dL   AST 62 (H) 0 - 37 U/L   ALT 61 (H) 0 - 35 U/L   Alkaline Phosphatase 120 (H) 39 - 117 U/L   Total Bilirubin 0.9 0.3 - 1.2 mg/dL   GFR calc non Af Amer >90 >90 mL/min   GFR calc Af Amer >90 >90 mL/min    Comment: (NOTE) The eGFR has been calculated using the CKD EPI equation. This calculation has not been validated in all clinical situations. eGFR's persistently <90 mL/min signify possible Chronic Kidney Disease.    Anion gap 14 5 - 15  Lipase, blood     Status: None   Collection Time: 02/18/15  1:54 PM  Result Value Ref Range   Lipase 26 11 - 59 U/L  I-Stat CG4 Lactic Acid, ED     Status: Abnormal   Collection Time: 02/18/15  2:01 PM  Result Value Ref Range   Lactic Acid, Venous 2.68 (HH) 0.5 - 2.0 mmol/L   Comment NOTIFIED PHYSICIAN    Ct Abdomen Pelvis W Contrast  02/18/2015   CLINICAL DATA:  Abdominal pain with fever and diarrhea  EXAM: CT ABDOMEN AND PELVIS WITH CONTRAST  TECHNIQUE: Multidetector CT imaging of the abdomen and pelvis was performed using the standard protocol following bolus administration of intravenous contrast. Oral contrast was also administered.  CONTRAST:  154m OMNIPAQUE IOHEXOL 300 MG/ML  SOLN  COMPARISON:  None.  FINDINGS: Lung bases are clear.  Liver is prominent, measuring 16.8 cm in length. No focal liver lesions are identified. Gallbladder wall is not thickened. There is no biliary duct dilatation.  Spleen, pancreas, adrenals appear normal. Kidneys bilaterally show no mass or hydronephrosis on either side. There is no renal or ureteral calculus on either side.  In the pelvis, the urinary bladder is midline with normal wall thickness. There is irregular wall thickening in the mid to distal sigmoid colon with a complex fluid collection measuring 4.5 x 3.3 x 2.3 cm consistent with a developing  diverticular abscess. There are several nearby tiny foci of air suggesting microperforation. There is a loculated fluid collection in the posterior pelvis separate from of the apparent diverticular abscess measuring 4.2 x 2.7 x 2.6 cm. This fluid may represent complex ascites but also may represent an early second abscess. This fluid collection is near but separate from the upper rectum. There is no well-defined mass in the pelvis. Appendix is absent.  There is no bowel obstruction. No free air apart from the microperforation in the sigmoid colon region or portal venous air. There is no adenopathy in the abdomen or pelvis. There is no abdominal aortic aneurysm. There are no blastic or lytic bone lesions.  IMPRESSION: Evidence of diverticulitis with developing diverticular abscess in the mid to distal sigmoid colon with apparent minimal micro- perforation. Question loculated ascites versus developing second abscess in the dependent portion of the pelvis posteriorly.  No bowel obstruction. Appendix absent. Liver prominent without focal liver lesion.  Critical Value/emergent results were called by telephone at the time of interpretation on 02/18/2015 at 4:03 pm to Dr. ALacretia Leigh, who verbally acknowledged these results.   Electronically Signed   By: WLowella GripIII M.D.   On: 02/18/2015 16:03    Review of Systems  Constitutional: Positive for fever, weight loss (40 pound intentional weight loss) and malaise/fatigue.  Respiratory: Negative for shortness of  breath.   Cardiovascular: Negative for chest pain and leg swelling.  Gastrointestinal: Positive for heartburn, nausea and abdominal pain. Negative for blood in stool.  Genitourinary: Negative for dysuria.    Blood pressure 133/60, pulse 110, temperature 98.1 F (36.7 C), temperature source Oral, resp. rate 17, height 5' 1" (1.549 m), weight 75.751 kg (167 lb), last menstrual period 02/23/2012, SpO2 96 %. Physical Exam  Constitutional:   Overweight female in NAD.  Her husband is with her.  HENT:  Head: Normocephalic and atraumatic.  Eyes: EOM are normal. No scleral icterus.  Wears glasses.  Neck: Neck supple.  Cardiovascular:  Increased rate.  Respiratory: Effort normal and breath sounds normal.  GI: Soft. She exhibits no mass. There is tenderness (LLQ).  Musculoskeletal: She exhibits no edema.  Neurological: She is alert.  Skin: Skin is warm and dry.  Psychiatric: She has a normal mood and affect. Her behavior is normal.     Assessment/Plan Acute diverticulitis with abscess next to the sigmoid colon and fluid collection suspicious for abscess in the pelvis. Both of these measure approximately 4.5 cm. No evidence clinically of sepsis or diffuse peritonitis. She has had multiple bouts of diverticulitis before. She reports her last colonoscopy was 4 years ago and demonstrated diverticular disease.    Plan: Admit to the hospital. IV fluid hydration. Bowel rest. Broad-spectrum IV antibiotics. Interventional radiology consult to see if the abscesses can be percutaneously drained. If not, she may need a laparoscopic drainage of abscesses with irrigation/washout. No indication for acute surgical intervention tonight.  ROSENBOWER,TODD J 02/18/2015, 5:06 PM

## 2015-02-18 NOTE — ED Notes (Signed)
Patient transported to CT 

## 2015-02-18 NOTE — ED Provider Notes (Signed)
CSN: [id number]     Arrival date & time 02/18/15  1027 History   First MD Initiated Contact with Patient 02/18/15 1316     Chief Complaint  Patient presents with  . Diarrhea  . Emesis  . Palpitations     (Consider location/radiation/quality/duration/timing/severity/associated sxs/prior Treatment) HPI Comments: Pt here with palitations and diarrhrea--some diffuse abd discomfort --dx with diverticulitis and rx'd flagyl and amoxil--unable to keep meds down, denies bloody stools--no chest pain but some dyspnea and weakness--took celebrex this am and felt slightly better--recent travel hx  Patient is a 59 y.o. female presenting with diarrhea, vomiting, and palpitations. The history is provided by the patient.  Diarrhea Associated symptoms: vomiting   Emesis Associated symptoms: diarrhea   Palpitations Associated symptoms: vomiting     Past Medical History  Diagnosis Date  . Diverticulitis of colon   . Obesity   . Hyperlipidemia   . Sleep apnea   . Heart murmur   . Irregular heartbeat   . Thyroid disease     hyperthyroid  . Bursitis   . Ectopic pregnancy    Past Surgical History  Procedure Laterality Date  . Colonoscopy  09-10-10    per Dr. Acquanetta Sit, diverticulosis of descending colon and sigmoid, internal hemorrhoids, repeat in 10 yrs  . Cesarean section    . Appendectomy    . Tonsillectomy  1980  . Tonsillectomy     Family History  Problem Relation Age of Onset  . Cancer Mother   . COPD Other   . Allergies Brother   . Heart disease Father   . Cancer Maternal Grandmother   . Skin cancer Father    History  Substance Use Topics  . Smoking status: Former Smoker -- 0.50 packs/day    Types: Cigarettes    Quit date: 02/07/2015  . Smokeless tobacco: Never Used     Comment: less than 1/2 pack a day  . Alcohol Use: Yes     Comment: occ   OB History    No data available     Review of Systems  Cardiovascular: Positive for palpitations.  Gastrointestinal:  Positive for vomiting and diarrhea.  All other systems reviewed and are negative.     Allergies  Mango butter; Levaquin; Scallops; Soy allergy; and Tomato  Home Medications   Prior to Admission medications   Medication Sig Start Date End Date Taking? Authorizing Provider  celecoxib (CELEBREX) 200 MG capsule Take 200 mg by mouth daily.   Yes Historical Provider, MD  albuterol (PROAIR HFA) 108 (90 BASE) MCG/ACT inhaler Inhale 2 puffs into the lungs every 4 (four) hours as needed for wheezing. 04/22/14 05/24/16  Laurey Morale, MD  amoxicillin-clavulanate (AUGMENTIN) 875-125 MG per tablet Take 1 tablet by mouth 2 (two) times daily. 12/09/14   Laurey Morale, MD  EPINEPHrine 0.3 mg/0.3 mL IJ SOAJ injection Inject 0.3 mLs (0.3 mg total) into the muscle once. 04/22/14   Laurey Morale, MD  fenofibrate 160 MG tablet Take 1 tablet (160 mg total) by mouth daily. 05/28/14 06/22/15  Laurey Morale, MD  FLUARIX QUADRIVALENT 0.5 ML injection  07/30/14   Historical Provider, MD  furosemide (LASIX) 20 MG tablet TAKE 1 TABLET BY MOUTH DAILY AS NEEDED (FLUID) 05/28/14   Laurey Morale, MD  metFORMIN (GLUCOPHAGE) 500 MG tablet Take 1 tablet (500 mg total) by mouth 2 (two) times daily with a meal. 05/28/14   Laurey Morale, MD  methimazole (TAPAZOLE) 5 MG tablet TAKE 1 TABLET  BY MOUTH DAILY 01/27/15   Philemon Kingdom, MD  metroNIDAZOLE (FLAGYL) 500 MG tablet Take 1 tablet (500 mg total) by mouth 2 (two) times daily. 12/09/14   Laurey Morale, MD   BP 104/55 mmHg  Pulse 113  Temp(Src) 98.1 F (36.7 C) (Oral)  Resp 20  Ht 5\' 1"  (1.549 m)  Wt 167 lb (75.751 kg)  BMI 31.57 kg/m2  SpO2 98%  LMP 02/23/2012 Physical Exam  Constitutional: She is oriented to person, place, and time. She appears well-developed and well-nourished.  Non-toxic appearance. No distress.  HENT:  Head: Normocephalic and atraumatic.  Eyes: Conjunctivae, EOM and lids are normal. Pupils are equal, round, and reactive to light.  Neck: Normal range of  motion. Neck supple. No tracheal deviation present. No thyroid mass present.  Cardiovascular: Regular rhythm and normal heart sounds.  Tachycardia present.  Exam reveals no gallop.   No murmur heard. Pulmonary/Chest: Effort normal and breath sounds normal. No stridor. No respiratory distress. She has no decreased breath sounds. She has no wheezes. She has no rhonchi. She has no rales.  Abdominal: Soft. Normal appearance and bowel sounds are normal. She exhibits no distension. There is generalized tenderness. There is no rigidity, no rebound, no guarding and no CVA tenderness.  Musculoskeletal: Normal range of motion. She exhibits no edema or tenderness.  Neurological: She is alert and oriented to person, place, and time. She has normal strength. No cranial nerve deficit or sensory deficit. GCS eye subscore is 4. GCS verbal subscore is 5. GCS motor subscore is 6.  Skin: Skin is warm and dry. No abrasion and no rash noted.  Psychiatric: She has a normal mood and affect. Her speech is normal and behavior is normal.  Nursing note and vitals reviewed.   ED Course  Procedures (including critical care time) Labs Review Labs Reviewed  URINE CULTURE  CBC WITH DIFFERENTIAL/PLATELET  COMPREHENSIVE METABOLIC PANEL  LIPASE, BLOOD  URINALYSIS, ROUTINE W REFLEX MICROSCOPIC    Imaging Review No results found.   EKG Interpretation   Date/Time:  Tuesday February 18 2015 10:35:13 EDT Ventricular Rate:  125 PR Interval:  127 QRS Duration: 73 QT Interval:  299 QTC Calculation: 431 R Axis:   62 Text Interpretation:  Sinus tachycardia Consider right atrial enlargement  Borderline repolarization abnormality Confirmed by Zenia Resides  MD, Milayna Rotenberg  ([id number]) on 02/18/2015 1:17:32 PM      MDM   Final diagnoses:  Abdominal pain    Patient with evidence of perforation on CT scan. Given antibiotics prior to all this and she has deferred pain medication. Spoke with general surgery and will admit the  patient    Lacretia Leigh, MD 02/18/15 [id number]

## 2015-02-18 NOTE — Telephone Encounter (Signed)
Patient Name: Alison Best DOB: 01/14/1956 Initial Comment Caller states she has been out of the country for 2 months came back last night. She has been having irregular heart palpitations that make her lightheaded. Nurse Assessment Nurse: Vallery Sa, RN, Cathy Date/Time (Eastern Time): 02/18/2015 9:07:23 AM Confirm and document reason for call. If symptomatic, describe symptoms. ---Caller states she developed irregular heart palpitation that are making her lightheaded back in march that became worse today. No injury in the past 3 days. She has had fever on and off the past 10 days (estimates her current temperature to be about 99.0), Has the patient traveled out of the country within the last 30 days? ---Yes Where have you traveled? ([place] for [place] and Ebola guideline, [place], [place] for [place]) ---[place], [place], [place] Does the patient require triage? ---Yes Related visit to physician within the last 2 weeks? ---No Does the PT have any chronic conditions? (i.e. diabetes, asthma, etc.) ---Yes List chronic conditions. ---Diverticulitis, Thyroid problems, Diabetes? Guidelines Guideline Title Affirmed Question Affirmed Notes Heart Rate and Heartbeat Questions Dizziness, lightheadedness, or weakness Final Disposition User Go to ED Now Vallery Sa, RN, [place] She plans to go to [employer] .

## 2015-02-18 NOTE — ED Notes (Signed)
Dr. Zenia Resides was made aware of elevated I-Stat CG4 Lactic Acid.

## 2015-02-19 ENCOUNTER — Inpatient Hospital Stay (HOSPITAL_COMMUNITY): Payer: BLUE CROSS/BLUE SHIELD

## 2015-02-19 LAB — BASIC METABOLIC PANEL
ANION GAP: 11 (ref 5–15)
BUN: 16 mg/dL (ref 6–23)
CHLORIDE: 105 mmol/L (ref 96–112)
CO2: 22 mmol/L (ref 19–32)
CREATININE: 0.55 mg/dL (ref 0.50–1.10)
Calcium: 8.8 mg/dL (ref 8.4–10.5)
GFR calc Af Amer: >90 mL/min (ref >90)
GFR calc non Af Amer: >90 mL/min (ref >90)
Glucose, Bld: 127 mg/dL — ABNORMAL HIGH (ref 70–99)
Potassium: 3.4 mmol/L — ABNORMAL LOW (ref 3.5–5.1)
SODIUM: 138 mmol/L (ref 135–145)

## 2015-02-19 LAB — CBC
HCT: 33.3 % — ABNORMAL LOW (ref 36.0–46.0)
Hemoglobin: 11 g/dL — ABNORMAL LOW (ref 12.0–15.0)
MCH: 27.3 pg (ref 26.0–34.0)
MCHC: 33 g/dL (ref 30.0–36.0)
MCV: 82.6 fL (ref 78.0–100.0)
PLATELETS: 498 10*3/uL — AB (ref 150–400)
RBC: 4.03 MIL/uL (ref 3.87–5.11)
RDW: 14.5 % (ref 11.5–15.5)
WBC: 16.5 10*3/uL — ABNORMAL HIGH (ref 4.0–10.5)

## 2015-02-19 LAB — APTT: aPTT: 36 seconds (ref 24–37)

## 2015-02-19 LAB — PROTIME-INR
INR: 1.66 — ABNORMAL HIGH (ref 0.00–1.49)
PROTHROMBIN TIME: 19.8 s — AB (ref 11.6–15.2)

## 2015-02-19 MED ORDER — MIDAZOLAM HCL 2 MG/2ML IJ SOLN
INTRAMUSCULAR | Status: AC
  Filled 2015-02-19: qty 6

## 2015-02-19 MED ORDER — MIDAZOLAM HCL 2 MG/2ML IJ SOLN
INTRAMUSCULAR | Status: AC | PRN
  Administered 2015-02-19 (×3): 1 mg via INTRAVENOUS

## 2015-02-19 MED ORDER — ACETAMINOPHEN 325 MG PO TABS
650.0000 mg | ORAL_TABLET | Freq: Four times a day (QID) | ORAL | Status: DC | PRN
  Administered 2015-02-19: 650 mg via ORAL

## 2015-02-19 MED ORDER — FENTANYL CITRATE (PF) 100 MCG/2ML IJ SOLN
INTRAMUSCULAR | Status: AC
  Filled 2015-02-19: qty 4

## 2015-02-19 MED ORDER — FENTANYL CITRATE (PF) 100 MCG/2ML IJ SOLN
INTRAMUSCULAR | Status: AC | PRN
  Administered 2015-02-19: 50 ug via INTRAVENOUS

## 2015-02-19 MED ORDER — SODIUM CHLORIDE 0.9 % IV BOLUS (SEPSIS)
1000.0000 mL | Freq: Once | INTRAVENOUS | Status: AC
  Administered 2015-02-19: 1000 mL via INTRAVENOUS

## 2015-02-19 MED ORDER — BLISTEX MEDICATED EX OINT
TOPICAL_OINTMENT | CUTANEOUS | Status: DC | PRN
  Filled 2015-02-19: qty 10

## 2015-02-19 MED ORDER — LIP MEDEX EX OINT
TOPICAL_OINTMENT | CUTANEOUS | Status: DC | PRN
  Filled 2015-02-19: qty 7

## 2015-02-19 NOTE — Procedures (Signed)
R transgluteal abscess drain 12 Fr Cloudy sanguinous fluid. No comp

## 2015-02-19 NOTE — Progress Notes (Signed)
Patient ID: Alison Best, female   DOB: November 05, 1955, 59 y.o.   MRN: [id number]     [facility]      [address] [place], [place] [address]    Phone: [phone] FAX: [phone]     Subjective: One bout of diarrhea.  Pain is somewhat better.  Bladder pressure and urinary frequency.  WBC 16.2--->16.5k.  Febrile T max 100.6.    Objective:  Vital signs:  Filed Vitals:   02/18/15 1857 02/18/15 2200 02/19/15 0230 02/19/15 0600  BP: 116/49 124/45 100/62 113/54  Pulse: 101 104 104 102  Temp: 98.8 F (37.1 C) 99.8 F (37.7 C) 100.6 F (38.1 C) 99.2 F (37.3 C)  TempSrc: Oral Oral Oral Oral  Resp: _0 Height:      Weight:      SpO2: 99% 94% 94% 93%    Last BM Date: 02/18/15  Intake/Output   Yesterday:  04/26 0701 - 04/27 0700 In: 914.6 [I.V.:914.6] Out: 300 [Urine:300] This shift:    I/O last 3 completed shifts: In: 914.6 [I.V.:914.6] Out: 300 [Urine:300]    Physical Exam: General: Pt awake/alert/oriented x4 in no acute distress Chest: cta.  No chest wall pain w good excursion Abdomen: Soft.  Nondistended.  TTP lower abdomen, particularly in the pelvic region.  No evidence of peritonitis.  No incarcerated hernias. Ext:  SCDs BLE.  No mjr edema.  No cyanosis Skin: No petechiae / purpura   Problem List:   Active Problems:   Diverticulitis of large intestine with abscess without bleeding    Results:   Labs: Results for orders placed or performed during the hospital encounter of 02/18/15 (from the past 48 hour(s))  Urinalysis, Routine w reflex microscopic     Status: Abnormal   Collection Time: 02/18/15  1:41 PM  Result Value Ref Range   Color, Urine ORANGE (A) YELLOW    Comment: BIOCHEMICALS MAY BE AFFECTED BY COLOR   APPearance TURBID (A) CLEAR   Specific Gravity, Urine 1.027 1.005 - 1.030   pH 6.0 5.0 - 8.0   Glucose, UA NEGATIVE NEGATIVE mg/dL   Hgb urine dipstick NEGATIVE NEGATIVE   Bilirubin Urine LARGE (A) NEGATIVE   Ketones, ur 15 (A) NEGATIVE mg/dL   Protein, ur 30 (A) NEGATIVE mg/dL   Urobilinogen, UA 1.0 0.0 - 1.0 mg/dL   Nitrite NEGATIVE NEGATIVE   Leukocytes, UA SMALL (A) NEGATIVE  Urine microscopic-add on     Status: Abnormal   Collection Time: 02/18/15  1:41 PM  Result Value Ref Range   Squamous Epithelial / LPF MANY (A) RARE   WBC, UA 21-50 <3 WBC/hpf   RBC / HPF 3-6 <3 RBC/hpf   Bacteria, UA MANY (A) RARE   Casts HYALINE CASTS (A) NEGATIVE   Crystals CA OXALATE CRYSTALS (A) NEGATIVE   Urine-Other MUCOUS PRESENT   CBC with Differential/Platelet     Status: Abnormal   Collection Time: 02/18/15  1:54 PM  Result Value Ref Range   WBC 16.2 (H) 4.0 - 10.5 K/uL   RBC 5.07 3.87 - 5.11 MIL/uL   Hemoglobin 13.8 12.0 - 15.0 g/dL   HCT 41.2 36.0 - 46.0 %   MCV 81.3 78.0 - 100.0 fL   MCH 27.2 26.0 - 34.0 pg   MCHC 33.5 30.0 - 36.0 g/dL   RDW 14.4 11.5 - 15.5 %   Platelets 607 (H) 150 - 400 K/uL   Neutrophils Relative % 68 43 -  77 %   Lymphocytes Relative 21 12 - 46 %   Monocytes Relative 11 3 - 12 %   Eosinophils Relative 0 0 - 5 %   Basophils Relative 0 0 - 1 %   Neutro Abs 11.0 (H) 1.7 - 7.7 K/uL   Lymphs Abs 3.4 0.7 - 4.0 K/uL   Monocytes Absolute 1.8 (H) 0.1 - 1.0 K/uL   Eosinophils Absolute 0.0 0.0 - 0.7 K/uL   Basophils Absolute 0.0 0.0 - 0.1 K/uL   Smear Review LARGE PLATELETS PRESENT   Comprehensive metabolic panel     Status: Abnormal   Collection Time: 02/18/15  1:54 PM  Result Value Ref Range   Sodium 136 135 - 145 mmol/L   Potassium 3.4 (L) 3.5 - 5.1 mmol/L   Chloride 98 96 - 112 mmol/L   CO2 24 19 - 32 mmol/L   Glucose, Bld 105 (H) 70 - 99 mg/dL   BUN 18 6 - 23 mg/dL   Creatinine, Ser 0.69 0.50 - 1.10 mg/dL   Calcium 10.0 8.4 - 10.5 mg/dL   Total Protein 7.4 6.0 - 8.3 g/dL   Albumin 3.1 (L) 3.5 - 5.2 g/dL   AST 62 (H) 0 - 37 U/L   ALT 61 (H) 0 - 35 U/L   Alkaline Phosphatase 120 (H) 39 - 117 U/L   Total Bilirubin 0.9 0.3 - 1.2  mg/dL   GFR calc non Af Amer >90 >90 mL/min   GFR calc Af Amer >90 >90 mL/min    Comment: (NOTE) The eGFR has been calculated using the CKD EPI equation. This calculation has not been validated in all clinical situations. eGFR's persistently <90 mL/min signify possible Chronic Kidney Disease.    Anion gap 14 5 - 15  Lipase, blood     Status: None   Collection Time: 02/18/15  1:54 PM  Result Value Ref Range   Lipase 26 11 - 59 U/L  I-Stat CG4 Lactic Acid, ED     Status: Abnormal   Collection Time: 02/18/15  2:01 PM  Result Value Ref Range   Lactic Acid, Venous 2.68 (HH) 0.5 - 2.0 mmol/L   Comment NOTIFIED PHYSICIAN   Basic metabolic panel     Status: Abnormal   Collection Time: 02/19/15  5:40 AM  Result Value Ref Range   Sodium 138 135 - 145 mmol/L   Potassium 3.4 (L) 3.5 - 5.1 mmol/L   Chloride 105 96 - 112 mmol/L   CO2 22 19 - 32 mmol/L   Glucose, Bld 127 (H) 70 - 99 mg/dL   BUN 16 6 - 23 mg/dL   Creatinine, Ser 0.55 0.50 - 1.10 mg/dL   Calcium 8.8 8.4 - 10.5 mg/dL   GFR calc non Af Amer >90 >90 mL/min   GFR calc Af Amer >90 >90 mL/min    Comment: (NOTE) The eGFR has been calculated using the CKD EPI equation. This calculation has not been validated in all clinical situations. eGFR's persistently <90 mL/min signify possible Chronic Kidney Disease.    Anion gap 11 5 - 15  CBC     Status: Abnormal   Collection Time: 02/19/15  5:40 AM  Result Value Ref Range   WBC 16.5 (H) 4.0 - 10.5 K/uL   RBC 4.03 3.87 - 5.11 MIL/uL   Hemoglobin 11.0 (L) 12.0 - 15.0 g/dL    Comment: DELTA CHECK NOTED REPEATED TO VERIFY    HCT 33.3 (L) 36.0 - 46.0 %   MCV 82.6 78.0 -  100.0 fL   MCH 27.3 26.0 - 34.0 pg   MCHC 33.0 30.0 - 36.0 g/dL   RDW 14.5 11.5 - 15.5 %   Platelets 498 (H) 150 - 400 K/uL    Imaging / Studies: Ct Abdomen Pelvis W Contrast  02/18/2015   CLINICAL DATA:  Abdominal pain with fever and diarrhea  EXAM: CT ABDOMEN AND PELVIS WITH CONTRAST  TECHNIQUE: Multidetector CT  imaging of the abdomen and pelvis was performed using the standard protocol following bolus administration of intravenous contrast. Oral contrast was also administered.  CONTRAST:  112m OMNIPAQUE IOHEXOL 300 MG/ML  SOLN  COMPARISON:  None.  FINDINGS: Lung bases are clear.  Liver is prominent, measuring 16.8 cm in length. No focal liver lesions are identified. Gallbladder wall is not thickened. There is no biliary duct dilatation.  Spleen, pancreas, adrenals appear normal. Kidneys bilaterally show no mass or hydronephrosis on either side. There is no renal or ureteral calculus on either side.  In the pelvis, the urinary bladder is midline with normal wall thickness. There is irregular wall thickening in the mid to distal sigmoid colon with a complex fluid collection measuring 4.5 x 3.3 x 2.3 cm consistent with a developing diverticular abscess. There are several nearby tiny foci of air suggesting microperforation. There is a loculated fluid collection in the posterior pelvis separate from of the apparent diverticular abscess measuring 4.2 x 2.7 x 2.6 cm. This fluid may represent complex ascites but also may represent an early second abscess. This fluid collection is near but separate from the upper rectum. There is no well-defined mass in the pelvis. Appendix is absent.  There is no bowel obstruction. No free air apart from the microperforation in the sigmoid colon region or portal venous air. There is no adenopathy in the abdomen or pelvis. There is no abdominal aortic aneurysm. There are no blastic or lytic bone lesions.  IMPRESSION: Evidence of diverticulitis with developing diverticular abscess in the mid to distal sigmoid colon with apparent minimal micro- perforation. Question loculated ascites versus developing second abscess in the dependent portion of the pelvis posteriorly.  No bowel obstruction. Appendix absent. Liver prominent without focal liver lesion.  Critical Value/emergent results were called by  telephone at the time of interpretation on 02/18/2015 at 4:03 pm to Dr. ALacretia Leigh, who verbally acknowledged these results.   Electronically Signed   By: WLowella GripIII M.D.   On: 02/18/2015 16:03    Medications / Allergies:  Scheduled Meds: . pantoprazole (PROTONIX) IV  40 mg Intravenous QHS  . piperacillin-tazobactam (ZOSYN)  IV  3.375 g Intravenous 3 times per day   Continuous Infusions: . dextrose 5 %-0.9% nacl with kcl 125 mL/hr at 02/19/15 0420   PRN Meds:.albuterol, lip balm, morphine injection, ondansetron  Antibiotics: Anti-infectives    Start     Dose/Rate Route Frequency Ordered Stop   02/18/15 1715  piperacillin-tazobactam (ZOSYN) IVPB 3.375 g     3.375 g 12.5 mL/hr over 240 Minutes Intravenous 3 times per day 02/18/15 1706     02/18/15 1615  Ampicillin-Sulbactam (UNASYN) 3 g in sodium chloride 0.9 % 100 mL IVPB  Status:  Discontinued     3 g 100 mL/hr over 60 Minutes Intravenous  Once 02/18/15 1604 02/18/15 1624        Assessment/Plan Acute diverticulitis with abscess next to sigmoid colon Pelvic fluid collection -will consult to IR to evaluate for drain collection.  If it is not amendable to drainage, will proceed with  laparoscopic washout. -zosyn D#1 -pain control/antiemetics VTE prophylaxis-SCD, will add lovenox based on plan above FEN-NPO, IVF  Erby Pian, ANP-BC [facility] Pager [phone]([id number]:30P)   02/19/2015 7:53 AM

## 2015-02-19 NOTE — Progress Notes (Signed)
Patient's blood pressure is 73/42 and heart rate 103, MD on call notified for orders Neta Mends RN 6:37 PM 02-19-2015

## 2015-02-19 NOTE — Progress Notes (Signed)
Order from MD Marlou Starks for a 1000 ml Fluid Bolus of Normal Saline Neta Mends RN 02-19-2015 6:40 PM

## 2015-02-19 NOTE — Progress Notes (Signed)
INITIAL NUTRITION ASSESSMENT  DOCUMENTATION CODES Per approved criteria  -Obesity Unspecified   INTERVENTION: -Diet advancement per MD -If bowel rest expected to last >7 days, consider nutrition support. -RD to continue to monitor for PO intake and education needs  NUTRITION DIAGNOSIS: Inadequate oral intake related to bowel rest as evidenced by NPO status.   Goal: Pt to meet >/= 90% of their estimated nutrition needs   Monitor:  Diet advancement, weight, labs, I/O's  Reason for Assessment: Pt identified as at nutrition risk on the Malnutrition Screen Tool  Admitting Dx: Persistent lower abdominal pain and fever  ASSESSMENT: 59 year old female with a history of 6-7 bouts of diverticulitis in the past. About 2 weeks ago, while on a cruise ship, she noted some lower abdominal discomfort and a ribbon like stools.  Pt with poor appetite x 10 days and 40 lb intentional weight loss. Pt followed by RDs at [facility].  Pt currently NPO for bowel rest. RD to follow-up once diet advanced and address education needs.  Labs reviewed:  Low K  Height: Ht Readings from Last 1 Encounters:  02/18/15 5\' 1"  (1.549 m)    Weight: Wt Readings from Last 1 Encounters:  02/18/15 167 lb (75.751 kg)    Ideal Body Weight: 105 lb  % Ideal Body Weight: 159%  Wt Readings from Last 10 Encounters:  02/18/15 167 lb (75.751 kg)  09/16/14 217 lb (98.431 kg)  06/18/14 223 lb (101.152 kg)  06/06/14 221 lb (100.245 kg)  05/28/14 225 lb (102.059 kg)  04/22/14 220 lb (99.791 kg)  09/07/13 233 lb (105.688 kg)  07/18/13 238 lb 12.8 oz (108.319 kg)  05/09/13 234 lb 3.2 oz (106.232 kg)  10/10/12 221 lb (100.245 kg)    Usual Body Weight: 220 lb  % Usual Body Weight: 76%  BMI:  Body mass index is 31.57 kg/(m^2).  Estimated Nutritional Needs: Kcal: 1500-1700 Protein: 65-75g Fluid: 1.5L/day  Skin: intact  Diet Order: Diet NPO time specified Except for: Ice Chips  EDUCATION NEEDS: -Education not  appropriate at this time   Intake/Output Summary (Last 24 hours) at 02/19/15 1138 Last data filed at 02/19/15 1000  Gross per 24 hour  Intake 914.58 ml  Output    300 ml  Net 614.58 ml    Last BM: 4/26  Labs:   Recent Labs Lab 02/18/15 1354 02/19/15 0540  NA 136 138  K 3.4* 3.4*  CL 98 105  CO2 24 22  BUN 18 16  CREATININE 0.69 0.55  CALCIUM 10.0 8.8  GLUCOSE 105* 127*    CBG (last 3)  No results for input(s): GLUCAP in the last 72 hours.  Scheduled Meds: . pantoprazole (PROTONIX) IV  40 mg Intravenous QHS  . piperacillin-tazobactam (ZOSYN)  IV  3.375 g Intravenous 3 times per day    Continuous Infusions: . dextrose 5 %-0.9% nacl with kcl 125 mL/hr at 02/19/15 1950    Past Medical History  Diagnosis Date  . Diverticulitis of colon   . Obesity   . Hyperlipidemia   . Sleep apnea   . Heart murmur   . Irregular heartbeat   . Thyroid disease     hyperthyroid  . Bursitis   . Ectopic pregnancy     Past Surgical History  Procedure Laterality Date  . Colonoscopy  09-10-10    per Dr. Acquanetta Sit, diverticulosis of descending colon and sigmoid, internal hemorrhoids, repeat in 10 yrs  . Cesarean section    . Appendectomy    .  Tonsillectomy  1980  . Tonsillectomy      Clayton Bibles, MS, RD, LDN Pager: [phone] After Hours Pager: [phone]

## 2015-02-19 NOTE — Consult Note (Signed)
Reason for consult: Pelvic abscess drainage  Referring Physician(s): CCS  History of Present Illness: Alison Best is a 59 y.o. female with known history of diverticular disease, prior appendectomy and recent history of persistent abdominal pain, fever, weakness, nausea /vomiting, palpitations and subsequent CT scan of abdomen/ pelvis on 02/18/15 which revealed diverticulitis with developing diverticular abscess in the mid to distal sigmoid colon with apparent minimal microperforation. There is also loculated ascites versus a developing second abscess in the dependent portion of the pelvis posteriorly. Patient has been evaluated by surgery and request has now been received for CT guided percutaneous drainage of the pelvic abscess. Past Medical History  Diagnosis Date  . Diverticulitis of colon   . Obesity   . Hyperlipidemia   . Sleep apnea   . Heart murmur   . Irregular heartbeat   . Thyroid disease     hyperthyroid  . Bursitis   . Ectopic pregnancy     Past Surgical History  Procedure Laterality Date  . Colonoscopy  09-10-10    per Dr. Acquanetta Sit, diverticulosis of descending colon and sigmoid, internal hemorrhoids, repeat in 10 yrs  . Cesarean section    . Appendectomy    . Tonsillectomy  1980  . Tonsillectomy      Allergies: Mango butter; Levaquin; Scallops; Soy allergy; and Tomato  Medications: Prior to Admission medications   Medication Sig Start Date End Date Taking? Authorizing Provider  acetaminophen (TYLENOL) 500 MG tablet Take 1,000 mg by mouth every 6 (six) hours as needed for moderate pain or headache.   Yes Historical Provider, MD  albuterol (PROAIR HFA) 108 (90 BASE) MCG/ACT inhaler Inhale 2 puffs into the lungs every 4 (four) hours as needed for wheezing. 04/22/14 05/24/16 Yes Laurey Morale, MD  celecoxib (CELEBREX) 200 MG capsule Take 200 mg by mouth daily.   Yes Historical Provider, MD  EPINEPHrine 0.3 mg/0.3 mL IJ SOAJ injection Inject 0.3 mLs (0.3 mg  total) into the muscle once. 04/22/14  Yes Laurey Morale, MD  fenofibrate 160 MG tablet Take 1 tablet (160 mg total) by mouth daily. 05/28/14 06/22/15 Yes Laurey Morale, MD  furosemide (LASIX) 20 MG tablet TAKE 1 TABLET BY MOUTH DAILY AS NEEDED (FLUID) 05/28/14  Yes Laurey Morale, MD  metFORMIN (GLUCOPHAGE) 500 MG tablet Take 1 tablet (500 mg total) by mouth 2 (two) times daily with a meal. 05/28/14  Yes Laurey Morale, MD  methimazole (TAPAZOLE) 5 MG tablet TAKE 1 TABLET BY MOUTH DAILY 01/27/15  Yes Philemon Kingdom, MD  amoxicillin-clavulanate (AUGMENTIN) 875-125 MG per tablet Take 1 tablet by mouth 2 (two) times daily. Patient not taking: Reported on 02/18/2015 12/09/14   Laurey Morale, MD  metroNIDAZOLE (FLAGYL) 500 MG tablet Take 1 tablet (500 mg total) by mouth 2 (two) times daily. Patient not taking: Reported on 02/18/2015 12/09/14   Laurey Morale, MD     Family History  Problem Relation Age of Onset  . Cancer Mother   . COPD Other   . Allergies Brother   . Heart disease Father   . Cancer Maternal Grandmother   . Skin cancer Father     History   Social History  . Marital Status: Married    Spouse Name: N/A  . Number of Children: N/A  . Years of Education: N/A   Occupational History  . Equities trader at [employer]  . Smoking status: Former Smoker --  0.50 packs/day    Types: Cigarettes    Quit date: 02/07/2015  . Smokeless tobacco: Never Used     Comment: less than 1/2 pack a day  . Alcohol Use: Yes     Comment: occ  . Drug Use: No  . Sexual Activity: Yes   Other Topics Concern  . None   Social History Narrative      Review of Systems see above  Vital Signs: BP 110/46 mmHg  Pulse 103  Temp(Src) 101.7 F (38.7 C) (Oral)  Resp 16  Ht 5\' 1"  (1.549 m)  Wt 167 lb (75.751 kg)  BMI 31.57 kg/m2  SpO2 90%  LMP 02/23/2012  Physical Exam patient awake, alert. Warm to touch, chest is clear to auscultation bilaterally. Heart  tachycardic with occasional ectopy. Abdomen soft, obese,diffusely tender, more so in the bilateral pelvic regions; extremities with full range of motion  Mallampati Score:     Imaging: Ct Abdomen Pelvis W Contrast  02/18/2015   CLINICAL DATA:  Abdominal pain with fever and diarrhea  EXAM: CT ABDOMEN AND PELVIS WITH CONTRAST  TECHNIQUE: Multidetector CT imaging of the abdomen and pelvis was performed using the standard protocol following bolus administration of intravenous contrast. Oral contrast was also administered.  CONTRAST:  128mL OMNIPAQUE IOHEXOL 300 MG/ML  SOLN  COMPARISON:  None.  FINDINGS: Lung bases are clear.  Liver is prominent, measuring 16.8 cm in length. No focal liver lesions are identified. Gallbladder wall is not thickened. There is no biliary duct dilatation.  Spleen, pancreas, adrenals appear normal. Kidneys bilaterally show no mass or hydronephrosis on either side. There is no renal or ureteral calculus on either side.  In the pelvis, the urinary bladder is midline with normal wall thickness. There is irregular wall thickening in the mid to distal sigmoid colon with a complex fluid collection measuring 4.5 x 3.3 x 2.3 cm consistent with a developing diverticular abscess. There are several nearby tiny foci of air suggesting microperforation. There is a loculated fluid collection in the posterior pelvis separate from of the apparent diverticular abscess measuring 4.2 x 2.7 x 2.6 cm. This fluid may represent complex ascites but also may represent an early second abscess. This fluid collection is near but separate from the upper rectum. There is no well-defined mass in the pelvis. Appendix is absent.  There is no bowel obstruction. No free air apart from the microperforation in the sigmoid colon region or portal venous air. There is no adenopathy in the abdomen or pelvis. There is no abdominal aortic aneurysm. There are no blastic or lytic bone lesions.  IMPRESSION: Evidence of  diverticulitis with developing diverticular abscess in the mid to distal sigmoid colon with apparent minimal micro- perforation. Question loculated ascites versus developing second abscess in the dependent portion of the pelvis posteriorly.  No bowel obstruction. Appendix absent. Liver prominent without focal liver lesion.  Critical Value/emergent results were called by telephone at the time of interpretation on 02/18/2015 at 4:03 pm to Dr. Lacretia Leigh , who verbally acknowledged these results.   Electronically Signed   By: Lowella Grip III M.D.   On: 02/18/2015 16:03    Labs:  CBC:  Recent Labs  04/22/14 1136 05/23/14 0933 02/18/15 1354 02/19/15 0540  WBC 8.9 8.1 16.2* 16.5*  HGB 14.2 14.6 13.8 11.0*  HCT 42.0 42.9 41.2 33.3*  PLT 285.0 256.0 607* 498*    COAGS: No results for input(s): INR, APTT in the last 8760 hours.  BMP:  Recent Labs  05/23/14 0933 02/18/15 1354 02/19/15 0540  NA 140 136 138  K 3.9 3.4* 3.4*  CL 109 98 105  CO2 25 24 22   GLUCOSE 129* 105* 127*  BUN 13 18 16   CALCIUM 9.0 10.0 8.8  CREATININE 0.6 0.69 0.55  GFRNONAA  --  >90 >90  GFRAA  --  >90 >90    LIVER FUNCTION TESTS:  Recent Labs  05/23/14 0933 02/18/15 1354  BILITOT 0.4 0.9  AST 21 62*  ALT 26 61*  ALKPHOS 98 120*  PROT 6.8 7.4  ALBUMIN 3.7 3.1*    TUMOR MARKERS: No results for input(s): AFPTM, CEA, CA199, CHROMGRNA in the last 8760 hours.  Assessment and Plan: GARNETTE GREB is a 59 y.o. female with known history of diverticular disease, prior appendectomy and recent history of persistent abdominal pain, fever, weakness, nausea /vomiting, palpitations and subsequent CT scan of abdomen/ pelvis on 02/18/15 which revealed diverticulitis with developing diverticular abscess in the mid to distal sigmoid colon with apparent minimal microperforation. There is also loculated ascites versus a developing second abscess in the dependent portion of the pelvis posteriorly. Patient has  been evaluated by surgery and request has now been received for CT guided percutaneous drainage of the pelvic abscess. Patient's temperature is currently 101.7, WBC 16.5, , hemoglobin 11.0, creatinine normal. Imaging studies have been reviewed by Dr. Barbie Banner and abscess is amenable for attempt at percutaneous drainage. Details/risks of procedure, including but not limited to, internal bleeding, sepsis, need for emergent surgery, injury to adjacent organs, discussed with patient and husband with her understanding and consent. Procedure scheduled for later today.     Signed: Autumn Messing 02/19/2015, 11:37 AM   I spent a total of 20 minutes in face to face in clinical consultation, greater than 50% of which was counseling/coordinating care for CT-guided pelvic abscess drainage

## 2015-02-19 NOTE — Progress Notes (Signed)
Telephone order received from [facility] for Tylenol 650 mg orally every 6 hours as needed for fever, NP notified concerning temperature 101.7 Neta Mends RN 02-19-2015 10:28 AM

## 2015-02-20 ENCOUNTER — Encounter (HOSPITAL_COMMUNITY): Payer: Self-pay | Admitting: Certified Registered Nurse Anesthetist

## 2015-02-20 ENCOUNTER — Inpatient Hospital Stay (HOSPITAL_COMMUNITY): Payer: BLUE CROSS/BLUE SHIELD | Admitting: Certified Registered Nurse Anesthetist

## 2015-02-20 ENCOUNTER — Inpatient Hospital Stay (HOSPITAL_COMMUNITY): Payer: BLUE CROSS/BLUE SHIELD

## 2015-02-20 ENCOUNTER — Encounter (HOSPITAL_COMMUNITY): Admission: EM | Disposition: A | Discharge status: Rehabilitation Facility | Payer: Self-pay | Source: Home / Self Care

## 2015-02-20 DIAGNOSIS — K631 Perforation of intestine (nontraumatic): Secondary | ICD-10-CM | POA: Diagnosis present

## 2015-02-20 HISTORY — PX: CYSTOSCOPY: SHX5120

## 2015-02-20 HISTORY — PX: COLOSTOMY REVISION: SHX5232

## 2015-02-20 HISTORY — PX: COLOSTOMY: SHX63

## 2015-02-20 HISTORY — PX: LAPAROTOMY: SHX154

## 2015-02-20 HISTORY — PX: SALPINGOOPHORECTOMY: SHX82

## 2015-02-20 LAB — CBC
HCT: 31.6 % — ABNORMAL LOW (ref 36.0–46.0)
HEMOGLOBIN: 10.1 g/dL — AB (ref 12.0–15.0)
MCH: 26.5 pg (ref 26.0–34.0)
MCHC: 32 g/dL (ref 30.0–36.0)
MCV: 82.9 fL (ref 78.0–100.0)
PLATELETS: 447 10*3/uL — AB (ref 150–400)
RBC: 3.81 MIL/uL — ABNORMAL LOW (ref 3.87–5.11)
RDW: 14.7 % (ref 11.5–15.5)
WBC: 21.8 10*3/uL — ABNORMAL HIGH (ref 4.0–10.5)

## 2015-02-20 LAB — SURGICAL PCR SCREEN
MRSA, PCR: NEGATIVE
STAPHYLOCOCCUS AUREUS: POSITIVE — AB

## 2015-02-20 LAB — ABO/RH: ABO/RH(D): O POS

## 2015-02-20 LAB — PROTIME-INR
INR: 1.84 — ABNORMAL HIGH (ref 0.00–1.49)
PROTHROMBIN TIME: 21.4 s — AB (ref 11.6–15.2)

## 2015-02-20 SURGERY — LAPAROTOMY, EXPLORATORY
Anesthesia: General | Site: Abdomen

## 2015-02-20 MED ORDER — DIPHENHYDRAMINE HCL 12.5 MG/5ML PO ELIX: 12.5000 mg | ORAL_SOLUTION | Freq: Four times a day (QID) | ORAL | Status: DC | PRN

## 2015-02-20 MED ORDER — LACTATED RINGERS IV SOLN: INTRAVENOUS | Status: DC

## 2015-02-20 MED ORDER — ALBUMIN HUMAN 5 % IV SOLN
INTRAVENOUS | Status: DC | PRN
  Administered 2015-02-20: 13:00:00 via INTRAVENOUS

## 2015-02-20 MED ORDER — ONDANSETRON HCL 4 MG/2ML IJ SOLN
INTRAMUSCULAR | Status: AC
  Filled 2015-02-20: qty 2

## 2015-02-20 MED ORDER — METHYLENE BLUE 1 % INJ SOLN
INTRAMUSCULAR | Status: AC
  Filled 2015-02-20: qty 10

## 2015-02-20 MED ORDER — PANTOPRAZOLE SODIUM 40 MG IV SOLR
40.0000 mg | INTRAVENOUS | Status: DC
  Administered 2015-02-20 – 2015-03-05 (×14): 40 mg via INTRAVENOUS
  Filled 2015-02-20 (×15): qty 40

## 2015-02-20 MED ORDER — PHENYLEPHRINE HCL 10 MG/ML IJ SOLN
INTRAMUSCULAR | Status: AC
  Filled 2015-02-20: qty 1

## 2015-02-20 MED ORDER — GLYCOPYRROLATE 0.2 MG/ML IJ SOLN
INTRAMUSCULAR | Status: AC
  Filled 2015-02-20: qty 3

## 2015-02-20 MED ORDER — FENTANYL CITRATE (PF) 250 MCG/5ML IJ SOLN
INTRAMUSCULAR | Status: AC
  Filled 2015-02-20: qty 5

## 2015-02-20 MED ORDER — FENTANYL CITRATE (PF) 100 MCG/2ML IJ SOLN
INTRAMUSCULAR | Status: DC | PRN
  Administered 2015-02-20 (×2): 50 ug via INTRAVENOUS
  Administered 2015-02-20: 25 ug via INTRAVENOUS
  Administered 2015-02-20: 50 ug via INTRAVENOUS
  Administered 2015-02-20: 25 ug via INTRAVENOUS
  Administered 2015-02-20 (×4): 50 ug via INTRAVENOUS
  Administered 2015-02-20: 100 ug via INTRAVENOUS

## 2015-02-20 MED ORDER — MIDAZOLAM HCL 2 MG/2ML IJ SOLN
INTRAMUSCULAR | Status: AC
  Filled 2015-02-20: qty 2

## 2015-02-20 MED ORDER — DEXTROSE IN LACTATED RINGERS 5 % IV SOLN
INTRAVENOUS | Status: DC
  Administered 2015-02-20: 1000 mL via INTRAVENOUS
  Administered 2015-02-21: 02:00:00 via INTRAVENOUS

## 2015-02-20 MED ORDER — NEOSTIGMINE METHYLSULFATE 10 MG/10ML IV SOLN
INTRAVENOUS | Status: DC | PRN
  Administered 2015-02-20: 3 mg via INTRAVENOUS

## 2015-02-20 MED ORDER — HYDROMORPHONE HCL 1 MG/ML IJ SOLN
INTRAMUSCULAR | Status: AC
  Filled 2015-02-20: qty 1

## 2015-02-20 MED ORDER — ONDANSETRON HCL 4 MG/2ML IJ SOLN
4.0000 mg | Freq: Four times a day (QID) | INTRAMUSCULAR | Status: DC | PRN
  Administered 2015-02-23: 4 mg via INTRAVENOUS

## 2015-02-20 MED ORDER — NALOXONE HCL 0.4 MG/ML IJ SOLN: 0.4000 mg | INTRAMUSCULAR | Status: DC | PRN

## 2015-02-20 MED ORDER — 0.9 % SODIUM CHLORIDE (POUR BTL) OPTIME
TOPICAL | Status: DC | PRN
  Administered 2015-02-20: 3000 mL

## 2015-02-20 MED ORDER — SODIUM CHLORIDE 0.9 % IV SOLN
Freq: Once | INTRAVENOUS | Status: AC
Start: 2015-02-20 — End: 2015-02-20
  Administered 2015-02-20: 11:00:00 via INTRAVENOUS

## 2015-02-20 MED ORDER — VITAMIN K1 10 MG/ML IJ SOLN
5.0000 mg | Freq: Once | INTRAMUSCULAR | Status: AC
  Administered 2015-02-20: 5 mg via SUBCUTANEOUS
  Filled 2015-02-20: qty 0.5

## 2015-02-20 MED ORDER — LIDOCAINE HCL (CARDIAC) 20 MG/ML IV SOLN
INTRAVENOUS | Status: DC | PRN
  Administered 2015-02-20: 75 mg via INTRAVENOUS

## 2015-02-20 MED ORDER — LACTATED RINGERS IV SOLN
INTRAVENOUS | Status: DC | PRN
  Administered 2015-02-20 (×3): via INTRAVENOUS

## 2015-02-20 MED ORDER — ENOXAPARIN SODIUM 40 MG/0.4ML ~~LOC~~ SOLN
40.0000 mg | SUBCUTANEOUS | Status: DC
  Administered 2015-02-21 – 2015-03-05 (×13): 40 mg via SUBCUTANEOUS
  Filled 2015-02-20 (×14): qty 0.4

## 2015-02-20 MED ORDER — MENTHOL 3 MG MT LOZG
1.0000 | LOZENGE | OROMUCOSAL | Status: DC | PRN
  Filled 2015-02-20: qty 9

## 2015-02-20 MED ORDER — DIPHENHYDRAMINE HCL 50 MG/ML IJ SOLN
12.5000 mg | Freq: Four times a day (QID) | INTRAMUSCULAR | Status: DC | PRN
  Administered 2015-02-22 – 2015-02-23 (×3): 12.5 mg via INTRAVENOUS
  Filled 2015-02-20 (×3): qty 1

## 2015-02-20 MED ORDER — ONDANSETRON HCL 4 MG/2ML IJ SOLN
4.0000 mg | Freq: Four times a day (QID) | INTRAMUSCULAR | Status: DC | PRN
Start: 2015-02-20 — End: 2015-03-06
  Administered 2015-02-28 – 2015-03-01 (×3): 4 mg via INTRAVENOUS
  Filled 2015-02-20 (×4): qty 2

## 2015-02-20 MED ORDER — HYDROMORPHONE HCL 1 MG/ML IJ SOLN
0.2500 mg | INTRAMUSCULAR | Status: DC | PRN
  Administered 2015-02-20 (×3): 0.5 mg via INTRAVENOUS

## 2015-02-20 MED ORDER — GLYCOPYRROLATE 0.2 MG/ML IJ SOLN
INTRAMUSCULAR | Status: DC | PRN
  Administered 2015-02-20: .4 mg via INTRAVENOUS

## 2015-02-20 MED ORDER — PIPERACILLIN-TAZOBACTAM 3.375 G IVPB
3.3750 g | Freq: Three times a day (TID) | INTRAVENOUS | Status: DC
  Administered 2015-02-20 – 2015-03-04 (×34): 3.375 g via INTRAVENOUS
  Filled 2015-02-20 (×37): qty 50

## 2015-02-20 MED ORDER — HYDROMORPHONE HCL 1 MG/ML IJ SOLN
1.0000 mg | Freq: Once | INTRAMUSCULAR | Status: AC
  Administered 2015-02-20: 1 mg via INTRAVENOUS
  Filled 2015-02-20: qty 1

## 2015-02-20 MED ORDER — MORPHINE SULFATE (PF) 1 MG/ML IV SOLN
INTRAVENOUS | Status: AC
  Administered 2015-02-21
  Filled 2015-02-20: qty 25

## 2015-02-20 MED ORDER — ONDANSETRON HCL 4 MG PO TABS: 4.0000 mg | ORAL_TABLET | Freq: Four times a day (QID) | ORAL | Status: DC | PRN

## 2015-02-20 MED ORDER — MORPHINE SULFATE (PF) 1 MG/ML IV SOLN
INTRAVENOUS | Status: DC
  Administered 2015-02-20: 15:00:00 via INTRAVENOUS
  Administered 2015-02-20: 10.5 mg via INTRAVENOUS
  Administered 2015-02-20: 4.5 mg via INTRAVENOUS
  Administered 2015-02-21: 10:00:00 via INTRAVENOUS
  Administered 2015-02-21: 10.79 mg via INTRAVENOUS
  Administered 2015-02-21: 14.9 mg via INTRAVENOUS
  Administered 2015-02-21: 18.8 mg via INTRAVENOUS
  Administered 2015-02-22: 6 mg via INTRAVENOUS
  Administered 2015-02-22: 21:00:00 via INTRAVENOUS
  Administered 2015-02-22: 10.5 mg via INTRAVENOUS
  Administered 2015-02-22: 02:00:00 via INTRAVENOUS
  Administered 2015-02-22 (×2): 1.5 mg via INTRAVENOUS
  Administered 2015-02-23: 4.5 mg via INTRAVENOUS
  Administered 2015-02-23: 3 mg via INTRAVENOUS
  Administered 2015-02-23: 20:00:00 via INTRAVENOUS
  Administered 2015-02-23: 3 mg via INTRAVENOUS
  Administered 2015-02-23: 7.5 mg via INTRAVENOUS
  Administered 2015-02-24: 4.37 mg via INTRAVENOUS
  Administered 2015-02-24: 7.5 mg via INTRAVENOUS
  Administered 2015-02-24: 3 mg via INTRAVENOUS
  Administered 2015-02-24: 6 mg via INTRAVENOUS
  Administered 2015-02-24: 7.5 mg via INTRAVENOUS
  Administered 2015-02-24: 6 mg via INTRAVENOUS
  Administered 2015-02-25: 2 mg via INTRAVENOUS
  Administered 2015-02-25: 6 mg via INTRAVENOUS
  Administered 2015-02-25: 11:00:00 via INTRAVENOUS
  Administered 2015-02-25: 12 mg via INTRAVENOUS
  Administered 2015-02-26: 6 mg via INTRAVENOUS
  Administered 2015-02-26: 18:00:00 via INTRAVENOUS
  Administered 2015-02-26: 5 mg via INTRAVENOUS
  Administered 2015-02-26: 6 mg via INTRAVENOUS
  Administered 2015-02-26: 8 mg via INTRAVENOUS
  Administered 2015-02-26: 3 mg via INTRAVENOUS
  Administered 2015-02-26 – 2015-02-27 (×2): via INTRAVENOUS
  Administered 2015-02-27: 3 mg via INTRAVENOUS
  Administered 2015-02-27: 18 mg via INTRAVENOUS
  Filled 2015-02-20 (×10): qty 25

## 2015-02-20 MED ORDER — DEXAMETHASONE SODIUM PHOSPHATE 10 MG/ML IJ SOLN
INTRAMUSCULAR | Status: AC
  Filled 2015-02-20: qty 1

## 2015-02-20 MED ORDER — ALBUMIN HUMAN 5 % IV SOLN
INTRAVENOUS | Status: AC
  Filled 2015-02-20: qty 250

## 2015-02-20 MED ORDER — SODIUM CHLORIDE 0.9 % IJ SOLN: 9.0000 mL | INTRAMUSCULAR | Status: DC | PRN

## 2015-02-20 MED ORDER — CISATRACURIUM BESYLATE (PF) 10 MG/5ML IV SOLN
INTRAVENOUS | Status: DC | PRN
  Administered 2015-02-20: 6 mg via INTRAVENOUS
  Administered 2015-02-20 (×4): 2 mg via INTRAVENOUS

## 2015-02-20 MED ORDER — NEOSTIGMINE METHYLSULFATE 10 MG/10ML IV SOLN
INTRAVENOUS | Status: AC
  Filled 2015-02-20: qty 1

## 2015-02-20 MED ORDER — PROPOFOL 10 MG/ML IV BOLUS
INTRAVENOUS | Status: DC | PRN
  Administered 2015-02-20: 70 mg via INTRAVENOUS

## 2015-02-20 MED ORDER — PROMETHAZINE HCL 25 MG/ML IJ SOLN: 6.2500 mg | INTRAMUSCULAR | Status: DC | PRN

## 2015-02-20 MED ORDER — PROPOFOL 10 MG/ML IV BOLUS
INTRAVENOUS | Status: AC
  Filled 2015-02-20: qty 20

## 2015-02-20 MED ORDER — STERILE WATER FOR IRRIGATION IR SOLN
Status: DC | PRN
  Administered 2015-02-20: 1000 mL

## 2015-02-20 MED ORDER — DEXAMETHASONE SODIUM PHOSPHATE 10 MG/ML IJ SOLN
INTRAMUSCULAR | Status: DC | PRN
  Administered 2015-02-20: 10 mg via INTRAVENOUS

## 2015-02-20 MED ORDER — CISATRACURIUM BESYLATE 20 MG/10ML IV SOLN
INTRAVENOUS | Status: AC
  Filled 2015-02-20: qty 10

## 2015-02-20 MED ORDER — PHENYLEPHRINE HCL 10 MG/ML IJ SOLN
10.0000 mg | INTRAMUSCULAR | Status: DC | PRN
  Administered 2015-02-20: 40 ug/min via INTRAVENOUS

## 2015-02-20 MED ORDER — METHYLENE BLUE 1 % INJ SOLN
INTRAMUSCULAR | Status: DC | PRN
  Administered 2015-02-20: 1 mL

## 2015-02-20 MED ORDER — LACTATED RINGERS IV SOLN
INTRAVENOUS | Status: DC | PRN
  Administered 2015-02-20: 12:00:00 via INTRAVENOUS

## 2015-02-20 MED ORDER — HYDROMORPHONE HCL 1 MG/ML IJ SOLN
1.0000 mg | INTRAMUSCULAR | Status: DC | PRN
  Administered 2015-02-20: 1 mg via INTRAVENOUS
  Filled 2015-02-20: qty 1

## 2015-02-20 MED ORDER — SUCCINYLCHOLINE CHLORIDE 20 MG/ML IJ SOLN
INTRAMUSCULAR | Status: DC | PRN
  Administered 2015-02-20: 100 mg via INTRAVENOUS

## 2015-02-20 SURGICAL SUPPLY — 59 items
APPLICATOR COTTON TIP 6IN STRL (MISCELLANEOUS) IMPLANT
BLADE EXTENDED COATED 6.5IN (ELECTRODE) ×5 IMPLANT
BLADE HEX COATED 2.75 (ELECTRODE) ×5 IMPLANT
BNDG GAUZE ELAST 4 BULKY (GAUZE/BANDAGES/DRESSINGS) ×5 IMPLANT
CATH INTERMIT  6FR 70CM (CATHETERS) ×5 IMPLANT
CLAMP POUCH DRAINAGE QUIET (OSTOMY) ×5 IMPLANT
COVER MAYO STAND STRL (DRAPES) ×10 IMPLANT
DRAIN CHANNEL 19F RND (DRAIN) ×5 IMPLANT
DRAPE LAPAROSCOPIC ABDOMINAL (DRAPES) ×5 IMPLANT
DRAPE UTILITY XL STRL (DRAPES) ×5 IMPLANT
DRAPE WARM FLUID 44X44 (DRAPE) ×5 IMPLANT
ELECT REM PT RETURN 9FT ADLT (ELECTROSURGICAL) ×5
ELECTRODE REM PT RTRN 9FT ADLT (ELECTROSURGICAL) ×4 IMPLANT
EVACUATOR DRAINAGE 10X20 100CC (DRAIN) ×4 IMPLANT
EVACUATOR SILICONE 100CC (DRAIN) ×1
GAUZE SPONGE 4X4 12PLY STRL (GAUZE/BANDAGES/DRESSINGS) ×10 IMPLANT
GLOVE BIO SURGEON STRL SZ 6.5 (GLOVE) ×5 IMPLANT
GLOVE BIO SURGEON STRL SZ7.5 (GLOVE) ×5 IMPLANT
GLOVE BIOGEL PI IND STRL 7.0 (GLOVE) ×8 IMPLANT
GLOVE BIOGEL PI INDICATOR 7.0 (GLOVE) ×2
GLOVE ECLIPSE 8.0 STRL XLNG CF (GLOVE) ×5 IMPLANT
GLOVE INDICATOR 8.0 STRL GRN (GLOVE) ×10 IMPLANT
GOWN STRL REUS W/ TWL LRG LVL4 (GOWN DISPOSABLE) ×8 IMPLANT
GOWN STRL REUS W/TWL LRG LVL4 (GOWN DISPOSABLE) ×2
GOWN STRL REUS W/TWL XL LVL3 (GOWN DISPOSABLE) ×15 IMPLANT
GUIDEWIRE STR DUAL SENSOR (WIRE) ×5 IMPLANT
HEMOSTAT SURGICEL 4X8 (HEMOSTASIS) ×5 IMPLANT
KIT BASIN OR (CUSTOM PROCEDURE TRAY) ×5 IMPLANT
LIGASURE IMPACT 36 18CM CVD LR (INSTRUMENTS) ×5 IMPLANT
NS IRRIG 1000ML POUR BTL (IV SOLUTION) ×5 IMPLANT
PACK CYSTO (CUSTOM PROCEDURE TRAY) ×5 IMPLANT
PACK GENERAL/GYN (CUSTOM PROCEDURE TRAY) ×5 IMPLANT
PAD ABD 8X10 STRL (GAUZE/BANDAGES/DRESSINGS) ×10 IMPLANT
POUCH OSTOMY 2  H (OSTOMY) ×5 IMPLANT
SPONGE DRAIN TRACH 4X4 STRL 2S (GAUZE/BANDAGES/DRESSINGS) ×5 IMPLANT
SPONGE LAP 18X18 X RAY DECT (DISPOSABLE) ×15 IMPLANT
STAPLER CUT CVD 40MM BLUE (STAPLE) ×5 IMPLANT
STAPLER PROXIMATE 75MM BLUE (STAPLE) ×5 IMPLANT
STAPLER VISISTAT 35W (STAPLE) ×5 IMPLANT
SUCTION POOLE TIP (SUCTIONS) ×10 IMPLANT
SUT PDS AB 1 CTX 36 (SUTURE) IMPLANT
SUT PDS AB 1 TP1 96 (SUTURE) ×10 IMPLANT
SUT PROLENE 2 0 BLUE (SUTURE) ×5 IMPLANT
SUT SILK 2 0 (SUTURE) ×1
SUT SILK 2 0 SH CR/8 (SUTURE) ×5 IMPLANT
SUT SILK 2-0 18XBRD TIE 12 (SUTURE) ×4 IMPLANT
SUT SILK 3 0 (SUTURE) ×1
SUT SILK 3 0 SH CR/8 (SUTURE) ×5 IMPLANT
SUT SILK 3-0 18XBRD TIE 12 (SUTURE) ×4 IMPLANT
SUT VIC AB 2-0 SH 18 (SUTURE) ×5 IMPLANT
SUT VIC AB 3-0 SH 18 (SUTURE) ×10 IMPLANT
SUT VIC AB 3-0 SH 8-18 (SUTURE) ×5 IMPLANT
SUT VICRYL 2 0 18  UND BR (SUTURE) ×1
SUT VICRYL 2 0 18 UND BR (SUTURE) ×4 IMPLANT
TAPE CLOTH SURG 4X10 WHT LF (GAUZE/BANDAGES/DRESSINGS) ×5 IMPLANT
TOWEL OR 17X26 10 PK STRL BLUE (TOWEL DISPOSABLE) ×10 IMPLANT
TOWEL OR NON WOVEN STRL DISP B (DISPOSABLE) ×5 IMPLANT
TRAY FOLEY CATH 16FRSI W/METER (SET/KITS/TRAYS/PACK) ×5 IMPLANT
WATER STERILE IRR 3000ML UROMA (IV SOLUTION) ×5 IMPLANT

## 2015-02-20 NOTE — Anesthesia Procedure Notes (Signed)
Procedure Name: Intubation Date/Time: 02/20/2015 11:36 AM Performed by: Ofilia Neas Pre-anesthesia Checklist: Patient identified, Emergency Drugs available, Suction available, Patient being monitored and Timeout performed Patient Re-evaluated:Patient Re-evaluated prior to inductionOxygen Delivery Method: Circle system utilized Preoxygenation: Pre-oxygenation with 100% oxygen Intubation Type: IV induction and Cricoid Pressure applied Ventilation: Mask ventilation without difficulty Laryngoscope Size: Mac and 3 Grade View: Grade I Tube type: Oral Tube size: 7.5 mm Number of attempts: 1 Airway Equipment and Method: Stylet Placement Confirmation: ETT inserted through vocal cords under direct vision,  positive ETCO2 and breath sounds checked- equal and bilateral Secured at: 21 cm Tube secured with: Tape Dental Injury: Teeth and Oropharynx as per pre-operative assessment

## 2015-02-20 NOTE — Op Note (Signed)
Operative Note  Alison Best female 59 y.o. 02/20/2015  PREOPERATIVE DX:  Complicated sigmoid diverticulitis with pelvic abscess  POSTOPERATIVE DX:  Perforated sigmoid diverticulitis with intra-abdominal abscesses  PROCEDURE:   Emergency exploratory laparotomy, drainage of intra-abdominal abscesses, sigmoid colectomy, left salpingo-oophorectomy, descending colostomy.         Surgeon: Odis Hollingshead   Assistants: Neysa Bonito M.D., Nedra Hai M.D.  Anesthesia: General endotracheal anesthesia  Intraoperative consultation: Dr. Kathie Rhodes, Urology  Indications:   This is a 59 year old female whose had multiple bouts of previous sigmoid diverticulitis. She was admitted 2 days ago with a complicated bout of sigmoid diverticulitis. A pelvic abscess and intra-abdominal abscess were noted which were felt to be corrected. Yesterday, she underwent percutaneous drainage of the abscess. Early this morning she developed acute worsening abdominal pain. She became tachycardic and had peritoneal signs on exam which were new. She is now brought to the operating room for emergency surgery.    Procedure Detail:  She was brought to the operating room placed supine on the operating table and a general anesthetic was given. Nasogastric tube was inserted. Abdominal wall was sterilely prepped and draped.  Beginning above the umbilicus, midline incision was made down to the level of the pubis. The skin was divided sharply. Subcutaneous tissue fashion peritoneum were divided with electrocautery. Upon entering the abdominal cavity, cloudy greenish fluid was noted in the pelvic area. Self-retaining retractor was placed. Small bowel was brought all the pelvis and some intra-abdominal abscesses and between bowel loops were noted and drained. There is an abscess in the left gutter area with feculent staining of the peritoneum in this area that was drained. I began mobilizing the descending colon down to a  firm inflamed sigmoid colon area by dividing lateral attachments. Using finger fracture I then began mobilizing some of the indurated abnormal sigmoid colon. Part of the sigmoid colon was densely adherent to the uterus. Using finger fracture I entered this plane and drained a large abscess. I could palpate the pelvic drainage catheter. The left ovary and fallopian tube were densely adherent to a portion of the inflamed colon and I separated these with finger fracture. I identified the left ureter and it was tracing into a densely indurated inflamed area. Using the linear cutting stapler, I divided the descending colon sigmoid colon junction. I then divided the mesentery using LigaSure and tying off vessels with suture. Using finger fracture the posterior sigmoid colon was mobilized from the pelvis. Using electrocautery and finger fracture I mobilized the right side the sigmoid colon. The left side was still densely adherent to the lateral sidewall and this was mobilized mostly with finger fracture as well as electrocautery. Bleeding vessels were identified and ligated. I subsequently was able to get down to an area of proximal rectum that appeared fairly normal. This was divided with a linear cutting stapler. The specimen was handed off the field as sigmoid colon with proximal and marked with a suture.  The left fallopian tube and left ovary were inflamed and hanging by a thin vascular pedicle. I subsequently ligated the pedicle and removed the left fallopian tube and ovary.   I inspected the left ureter and appeared to be dilated at a point it was entering the dense inflammatory process. Because of this I requested a urology consultation to make sure there is no obstruction or injury of the ureter.  Dr. Karsten Ro performed examination of the ureter as well as cystoscopy and left ureteral catheterization and injection of  Chromagen. There is no evidence of ureteral stricture or injury.  Following this, the  abdominal cavity was copiously irrigated out with saline solution. A stab incision was made in the right lower quadrant and size 19 Blake drain placed into the abdominal cavity and positioned in the pelvis. It was anchored to the skin with 3-0 nylon suture. A circular incision was then made in the left lower quadrant skin and subcutaneous tissue at the site of a colostomy marking. A cruciate incision was made in the anterior and posterior fascia and peritoneum. The descending colon stump was brought up through this defect. I then anchored the descending colon stump to the anterior fascia with 4 interrupted 2-0 Vicryl sutures.  Following this, the fascia was then closed with a running #1 double looped PDS. Needle, sponge, and instrument counts were reported to be correct at this time. The colostomy was then matured with interrupted 3-0 Vicryl sutures. The midline wound was packed with sterile moistened gauze. A colostomy appliance was placed. A bulky dry dressing was placed on the midline wound.  The pelvic drain was from a transgluteal approach and I removed the pelvic drain from the gluteal area.  She tolerated the procedure well without any apparent complications and was taken to the recovery room in satisfactory condition.   Estimated Blood Loss:  500 ml         Drains: #19 Blake  Blood Given: 2 units FFP          Specimens: sigmoid colon, left fallopian tube and ovary.        Complications:  * No complications entered in OR log *         Disposition: PACU - hemodynamically stable.         Condition: stable

## 2015-02-20 NOTE — Progress Notes (Signed)
Patient ID: Alison Best, female   DOB: 1956/08/04, 59 y.o.   MRN: [id number]     [facility]      [address] [place], [place] [address]    Phone: [phone] FAX: [phone]     Subjective: The patient reports increased pain since about 3AM this morning.  Reports dysuria.  Tachycardia.  BP is stable.  WBC up to 21k.   Denies nausea or vomiting.  Denies sob, cp, palpitations.    Objective:  Vital signs:  Filed Vitals:   02/19/15 2005 02/19/15 2146 02/20/15 0206 02/20/15 0515  BP:  108/40 124/51 116/48  Pulse:  102 99 101  Temp:  98.3 F (36.8 C) 98.1 F (36.7 C) 98.4 F (36.9 C)  TempSrc:  Oral Oral Oral  Resp:  18 16 18   Height:      Weight:      SpO2: 96% 98% 98% 90%    Last BM Date: 02/18/15  Intake/Output   Yesterday:  04/27 0701 - 04/28 0700 In: 3408.3 [I.V.:3208.3; IV Piggyback:200] Out: 345 [Urine:325; Drains:20] This shift:    I/O last 3 completed shifts: In: 4322.9 [I.V.:4122.9; IV Piggyback:200] Out: 335 [Urine:625; Drains:20]    Physical Exam: General: Pt awake/alert/oriented x4 in no acute distress Chest: cta. No chest wall pain w good excursion Abdomen: +bs, peritoneal signs.  Gluteal drain with purulent sanguinous output.  No evidence of peritonitis. No incarcerated hernias. Ext: SCDs BLE. No mjr edema. No cyanosis Skin: No petechiae / purpura   Problem List:   Active Problems:   Diverticulitis of large intestine with abscess without bleeding    Results:   Labs: Results for orders placed or performed during the hospital encounter of 02/18/15 (from the past 48 hour(s))  Urinalysis, Routine w reflex microscopic     Status: Abnormal   Collection Time: 02/18/15  1:41 PM  Result Value Ref Range   Color, Urine ORANGE (A) YELLOW    Comment: BIOCHEMICALS MAY BE AFFECTED BY COLOR   APPearance TURBID (A) CLEAR   Specific Gravity, Urine 1.027 1.005 - 1.030   pH 6.0 5.0 - 8.0   Glucose, UA NEGATIVE NEGATIVE mg/dL   Hgb urine dipstick NEGATIVE NEGATIVE   Bilirubin Urine LARGE (A) NEGATIVE   Ketones, ur 15 (A) NEGATIVE mg/dL   Protein, ur 30 (A) NEGATIVE mg/dL   Urobilinogen, UA 1.0 0.0 - 1.0 mg/dL   Nitrite NEGATIVE NEGATIVE   Leukocytes, UA SMALL (A) NEGATIVE  Urine culture     Status: None (Preliminary result)   Collection Time: 02/18/15  1:41 PM  Result Value Ref Range   Specimen Description URINE, RANDOM    Special Requests NONE    Colony Count      75,000 COLONIES/ML Performed at [place] Performed at [facility]    Report Status PENDING   Urine microscopic-add on     Status: Abnormal   Collection Time: 02/18/15  1:41 PM  Result Value Ref Range   Squamous Epithelial / LPF MANY (A) RARE   WBC, UA 21-50 <3 WBC/hpf   RBC / HPF 3-6 <3 RBC/hpf   Bacteria, UA MANY (A) RARE   Casts HYALINE CASTS (A) NEGATIVE   Crystals CA OXALATE CRYSTALS (A) NEGATIVE   Urine-Other MUCOUS PRESENT   CBC with Differential/Platelet     Status: Abnormal   Collection Time: 02/18/15  1:54 PM  Result  Value Ref Range   WBC 16.2 (H) 4.0 - 10.5 K/uL   RBC 5.07 3.87 - 5.11 MIL/uL   Hemoglobin 13.8 12.0 - 15.0 g/dL   HCT 41.2 36.0 - 46.0 %   MCV 81.3 78.0 - 100.0 fL   MCH 27.2 26.0 - 34.0 pg   MCHC 33.5 30.0 - 36.0 g/dL   RDW 14.4 11.5 - 15.5 %   Platelets 607 (H) 150 - 400 K/uL   Neutrophils Relative % 68 43 - 77 %   Lymphocytes Relative 21 12 - 46 %   Monocytes Relative 11 3 - 12 %   Eosinophils Relative 0 0 - 5 %   Basophils Relative 0 0 - 1 %   Neutro Abs 11.0 (H) 1.7 - 7.7 K/uL   Lymphs Abs 3.4 0.7 - 4.0 K/uL   Monocytes Absolute 1.8 (H) 0.1 - 1.0 K/uL   Eosinophils Absolute 0.0 0.0 - 0.7 K/uL   Basophils Absolute 0.0 0.0 - 0.1 K/uL   Smear Review LARGE PLATELETS PRESENT   Comprehensive metabolic panel     Status: Abnormal   Collection Time: 02/18/15  1:54 PM  Result Value Ref Range   Sodium 136 135 -  145 mmol/L   Potassium 3.4 (L) 3.5 - 5.1 mmol/L   Chloride 98 96 - 112 mmol/L   CO2 24 19 - 32 mmol/L   Glucose, Bld 105 (H) 70 - 99 mg/dL   BUN 18 6 - 23 mg/dL   Creatinine, Ser 0.69 0.50 - 1.10 mg/dL   Calcium 10.0 8.4 - 10.5 mg/dL   Total Protein 7.4 6.0 - 8.3 g/dL   Albumin 3.1 (L) 3.5 - 5.2 g/dL   AST 62 (H) 0 - 37 U/L   ALT 61 (H) 0 - 35 U/L   Alkaline Phosphatase 120 (H) 39 - 117 U/L   Total Bilirubin 0.9 0.3 - 1.2 mg/dL   GFR calc non Af Amer >90 >90 mL/min   GFR calc Af Amer >90 >90 mL/min    Comment: (NOTE) The eGFR has been calculated using the CKD EPI equation. This calculation has not been validated in all clinical situations. eGFR's persistently <90 mL/min signify possible Chronic Kidney Disease.    Anion gap 14 5 - 15  Lipase, blood     Status: None   Collection Time: 02/18/15  1:54 PM  Result Value Ref Range   Lipase 26 11 - 59 U/L  I-Stat CG4 Lactic Acid, ED     Status: Abnormal   Collection Time: 02/18/15  2:01 PM  Result Value Ref Range   Lactic Acid, Venous 2.68 (HH) 0.5 - 2.0 mmol/L   Comment NOTIFIED PHYSICIAN   Basic metabolic panel     Status: Abnormal   Collection Time: 02/19/15  5:40 AM  Result Value Ref Range   Sodium 138 135 - 145 mmol/L   Potassium 3.4 (L) 3.5 - 5.1 mmol/L   Chloride 105 96 - 112 mmol/L   CO2 22 19 - 32 mmol/L   Glucose, Bld 127 (H) 70 - 99 mg/dL   BUN 16 6 - 23 mg/dL   Creatinine, Ser 0.55 0.50 - 1.10 mg/dL   Calcium 8.8 8.4 - 10.5 mg/dL   GFR calc non Af Amer >90 >90 mL/min   GFR calc Af Amer >90 >90 mL/min    Comment: (NOTE) The eGFR has been calculated using the CKD EPI equation. This calculation has not been validated in all clinical situations. eGFR's persistently <90 mL/min signify possible  Chronic Kidney Disease.    Anion gap 11 5 - 15  CBC     Status: Abnormal   Collection Time: 02/19/15  5:40 AM  Result Value Ref Range   WBC 16.5 (H) 4.0 - 10.5 K/uL   RBC 4.03 3.87 - 5.11 MIL/uL   Hemoglobin 11.0 (L) 12.0  - 15.0 g/dL    Comment: DELTA CHECK NOTED REPEATED TO VERIFY    HCT 33.3 (L) 36.0 - 46.0 %   MCV 82.6 78.0 - 100.0 fL   MCH 27.3 26.0 - 34.0 pg   MCHC 33.0 30.0 - 36.0 g/dL   RDW 14.5 11.5 - 15.5 %   Platelets 498 (H) 150 - 400 K/uL  Protime-INR     Status: Abnormal   Collection Time: 02/19/15 11:56 AM  Result Value Ref Range   Prothrombin Time 19.8 (H) 11.6 - 15.2 seconds   INR 1.66 (H) 0.00 - 1.49  APTT     Status: None   Collection Time: 02/19/15 11:56 AM  Result Value Ref Range   aPTT 36 24 - 37 seconds  CBC     Status: Abnormal   Collection Time: 02/20/15  5:15 AM  Result Value Ref Range   WBC 21.8 (H) 4.0 - 10.5 K/uL   RBC 3.81 (L) 3.87 - 5.11 MIL/uL   Hemoglobin 10.1 (L) 12.0 - 15.0 g/dL   HCT 31.6 (L) 36.0 - 46.0 %   MCV 82.9 78.0 - 100.0 fL   MCH 26.5 26.0 - 34.0 pg   MCHC 32.0 30.0 - 36.0 g/dL   RDW 14.7 11.5 - 15.5 %   Platelets 447 (H) 150 - 400 K/uL    Imaging / Studies: Ct Abdomen Pelvis W Contrast  02/18/2015   CLINICAL DATA:  Abdominal pain with fever and diarrhea  EXAM: CT ABDOMEN AND PELVIS WITH CONTRAST  TECHNIQUE: Multidetector CT imaging of the abdomen and pelvis was performed using the standard protocol following bolus administration of intravenous contrast. Oral contrast was also administered.  CONTRAST:  151m OMNIPAQUE IOHEXOL 300 MG/ML  SOLN  COMPARISON:  None.  FINDINGS: Lung bases are clear.  Liver is prominent, measuring 16.8 cm in length. No focal liver lesions are identified. Gallbladder wall is not thickened. There is no biliary duct dilatation.  Spleen, pancreas, adrenals appear normal. Kidneys bilaterally show no mass or hydronephrosis on either side. There is no renal or ureteral calculus on either side.  In the pelvis, the urinary bladder is midline with normal wall thickness. There is irregular wall thickening in the mid to distal sigmoid colon with a complex fluid collection measuring 4.5 x 3.3 x 2.3 cm consistent with a developing diverticular  abscess. There are several nearby tiny foci of air suggesting microperforation. There is a loculated fluid collection in the posterior pelvis separate from of the apparent diverticular abscess measuring 4.2 x 2.7 x 2.6 cm. This fluid may represent complex ascites but also may represent an early second abscess. This fluid collection is near but separate from the upper rectum. There is no well-defined mass in the pelvis. Appendix is absent.  There is no bowel obstruction. No free air apart from the microperforation in the sigmoid colon region or portal venous air. There is no adenopathy in the abdomen or pelvis. There is no abdominal aortic aneurysm. There are no blastic or lytic bone lesions.  IMPRESSION: Evidence of diverticulitis with developing diverticular abscess in the mid to distal sigmoid colon with apparent minimal micro- perforation. Question loculated ascites versus  developing second abscess in the dependent portion of the pelvis posteriorly.  No bowel obstruction. Appendix absent. Liver prominent without focal liver lesion.  Critical Value/emergent results were called by telephone at the time of interpretation on 02/18/2015 at 4:03 pm to Dr. Lacretia Leigh , who verbally acknowledged these results.   Electronically Signed   By: Lowella Grip III M.D.   On: 02/18/2015 16:03   Ct Image Guided Drainage Percut Cath  Peritoneal Retroperit  02/19/2015   CLINICAL DATA:  Pelvic abscess  EXAM: CT CORE BIOPSY RENAL  FLUOROSCOPY TIME:  None  MEDICATIONS AND MEDICAL HISTORY: Versed 3 mg, Fentanyl 50 mcg.  Additional Medications: None.  ANESTHESIA/SEDATION: Moderate sedation time: 11 minutes  CONTRAST:  None  PROCEDURE: The procedure, risks, benefits, and alternatives were explained to the patient. Questions regarding the procedure were encouraged and answered. The patient understands and consents to the procedure.  The right gluteal region in the prone position was prepped with Betadine in a sterile fashion, and  a sterile drape was applied covering the operative field. A sterile gown and sterile gloves were used for the procedure.  Under CT guidance, an 18 gauge needle was inserted into the pelvic abscess via right trans gluteal approach and removed over an Amplatz. A 12 French dilator followed by a 12 [place] drain were inserted. It was looped and string fixed and sewn to the skin. Cloudy sanguinous fluid was aspirated.  FINDINGS: Images document drain placement into a right pelvic abscess via right trans gluteal approach.  COMPLICATIONS: None  IMPRESSION: Successful right pelvic trans gluteal 12 French abscess drain.   Electronically Signed   By: Marybelle Killings M.D.   On: 02/19/2015 15:51    Medications / Allergies:  Scheduled Meds: . pantoprazole (PROTONIX) IV  40 mg Intravenous QHS  . phytonadione  5 mg Subcutaneous Once  . piperacillin-tazobactam (ZOSYN)  IV  3.375 g Intravenous 3 times per day   Continuous Infusions: . dextrose 5 %-0.9% nacl with kcl 125 mL/hr at 02/20/15 0458   PRN Meds:.acetaminophen, albuterol, HYDROmorphone (DILAUDID) injection, lip balm, morphine injection, ondansetron  Antibiotics: Anti-infectives    Start     Dose/Rate Route Frequency Ordered Stop   02/18/15 1715  piperacillin-tazobactam (ZOSYN) IVPB 3.375 g     3.375 g 12.5 mL/hr over 240 Minutes Intravenous 3 times per day 02/18/15 1706     02/18/15 1615  Ampicillin-Sulbactam (UNASYN) 3 g in sodium chloride 0.9 % 100 mL IVPB  Status:  Discontinued     3 g 100 mL/hr over 60 Minutes Intravenous  Once 02/18/15 1604 02/18/15 1624         Assessment/Plan Acute diverticulitis with abscess next to sigmoid colon Pelvic fluid collection S/p IR drain placement On exam, the patient exhibits peritoneal signs.  Will proceed with emergent exploratory laparotomy/hartmans's procedure.  WOC consulted for ostomy marking.  Will order a STAT INR and type and screen.  Give 65m of SQ vitamin K(IV is not functioning).  Hold 2 units of  FFP if needed perioperatively.   -Zosyn D#2 -change to dilaudid, give x1 dose as it was not administrated -restart IV  INR 1.6 -unclear, not on any blood thinners -vitamin K as above and type and hold for FFP VTE prophylaxis-SCD for now FEN-NPO, IVF   EErby Pian ANP-BC [facility] Pager [phone]([id number]:30P)   02/20/2015 7:58 AM

## 2015-02-20 NOTE — Consult Note (Addendum)
WOC consult requested for possible colostomy stoma site marking; surgery is planned emergently. Pt states she is unable to move very much including sitting on the side of the bed or standing R/T severe pain at this time.  Assessed abd while lying in bed and sitting with the HOB up as much as she could tolerate.  Mark placed in the LLQ to area free from folds, within rectus muscles, and in line of vision.  Stoma site placed 6 cm to the left of the umbilicus and 5 cm below. Demonstrated pouch appearance and answered questions.  [facility] team will plan to follow post-op for educational sessions if patient receives a colostomy . Please re-consult if further assistance is needed.  Thank-you,  Julien Girt MSN, [place], [place], [place], [place]

## 2015-02-20 NOTE — Op Note (Signed)
PATIENT:  Alison Best  PRE-OPERATIVE DIAGNOSIS: Possible ureteral injury versus obstruction  POST-OPERATIVE DIAGNOSIS: Intact left ureter with no obstruction  PROCEDURE: 1. Intra-abdominal examination of the ureter 2. Cystoscopy 3. Left ureteral catheterization and injection of chromogen  SURGEON:  Claybon Jabs  INDICATION: Alison Best is a 59 year old female with a pelvic abscess that was initially drained percutaneously. She then developed acute peritonitis and required emergent exploration by Dr. Zella Richer. During the operation there was a question of a possible ureteral injury and because of the ureter's location passing through the area of intense inflammation it raised the question of possible extrinsic compression of the ureter with obstruction. There was no evidence of hydronephrosis on a CT scan done earlier in the week however it was felt the ureter appeared slightly dilated intraoperatively. I was asked to come in to the operation for intraoperative consultation.  ANESTHESIA:  General  EBL:  Minimal  DRAINS: 16 French Foley catheter  LOCAL MEDICATIONS USED:  None  SPECIMEN:  None  Description of procedure: I came into the operating room and the patient had previously undergone exploration and the left ureter appeared to be exposed. I examined this structure and it appeared to be peristalsing. It did not appear to be particularly dilated. I was able to palpate this structure which I felt was the ureter as it entered into the pelvis and into the area of inflammation and phlegmon formation. I was unable to identify any injury to the ureter either visually, by palpation or by the presence of urine in the wound.  The patient was then moved into the dorsal lithotomy position and her genitalia were sterilely prepped with Hibiclens and draped. The 46 French cystoscope was then passed into the bladder and the bladder was fully inspected and noted to be free of any tumors,  stones or inflammatory lesions. 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  I passed a 6 [place] open-ended ureteral catheter through the cystoscope and into the left ureteral orifice and then passed a 0.038 inch floppy-tipped guidewire through the open-ended catheter and up the ureter without resistance or difficulty. I then passed the open-ended catheter over the guidewire and remove the guidewire. Simultaneously Dr. Zella Richer was able to palpate the ureter with the ureteral catheter in place and was not able to identify any evidence of ureteral injury.  I then removed the open-ended catheter and inserted it into the distal left ureter about 1 cm. I then injected methylene blue. 10 mL were injected and the ureter was simultaneously observed and appeared to be completely intact. A laparotomy sponge was placed in the pelvis over the ureter and then removed and was noted be free of any blue stain. It was felt the ureter was intact and it was also felt that there was no evidence of obstruction of the ureter. I therefore removed the open-ended catheter and the cystoscope and a new 16 French Foley catheter was inserted. The patient tolerated this portion of the procedure well with no intraoperative complications.    PLAN OF CARE: Discharge to home after PACU  PATIENT DISPOSITION:  PACU - hemodynamically stable.

## 2015-02-20 NOTE — Transfer of Care (Signed)
Immediate Anesthesia Transfer of Care Note  Patient: Alison Best  Procedure(s) Performed: Procedure(s): Emergency EXPLORATORY and drainiage of interabdominal abcesses (N/A) CYSTOSCOPY (N/A) SALPINGO OOPHORECTOMY (Left) COLOSTOMY (N/A) Sigmoid Colectomy  Patient Location: PACU  Anesthesia Type:General  Level of Consciousness: awake, oriented, patient cooperative, lethargic and responds to stimulation  Airway & Oxygen Therapy: Patient Spontanous Breathing and Patient connected to face mask oxygen  Post-op Assessment: Report given to RN, Post -op Vital signs reviewed and stable and Patient moving all extremities  Post vital signs: Reviewed and stable  Last Vitals:  Filed Vitals:   02/20/15 1121  BP:   Pulse: 107  Temp:   Resp:     Complications: No apparent anesthesia complications

## 2015-02-20 NOTE — Anesthesia Postprocedure Evaluation (Signed)
  Anesthesia Post-op Note  Patient: Alison Best  Procedure(s) Performed: Procedure(s) (LRB): Emergency EXPLORATORY and drainiage of interabdominal abcesses (N/A) CYSTOSCOPY (N/A) SALPINGO OOPHORECTOMY (Left) COLOSTOMY (N/A) Sigmoid Colectomy  Patient Location: PACU  Anesthesia Type: General  Level of Consciousness: awake and alert   Airway and Oxygen Therapy: Patient Spontanous Breathing  Post-op Pain: mild  Post-op Assessment: Post-op Vital signs reviewed, Patient's Cardiovascular Status Stable, Respiratory Function Stable, Patent Airway and No signs of Nausea or vomiting  Last Vitals:  Filed Vitals:   02/20/15 1549  BP: 102/32  Pulse: 110  Temp:   Resp: 16    Post-op Vital Signs: stable   Complications: No apparent anesthesia complications

## 2015-02-20 NOTE — Anesthesia Preprocedure Evaluation (Addendum)
Anesthesia Evaluation  Patient identified by MRN, date of birth, ID band Patient awake    Reviewed: Allergy & Precautions, NPO status , Patient's Chart, lab work & pertinent test results  Airway Mallampati: II  TM Distance: >3 FB Neck ROM: Full    Dental no notable dental hx.    Pulmonary asthma , sleep apnea , former smoker,  breath sounds clear to auscultation  Pulmonary exam normal       Cardiovascular negative cardio ROS  Rhythm:Regular Rate:Normal     Neuro/Psych negative neurological ROS  negative psych ROS   GI/Hepatic Neg liver ROS, GERD-  Medicated,  Endo/Other  diabetesHyperthyroidism   Renal/GU negative Renal ROS  negative genitourinary   Musculoskeletal negative musculoskeletal ROS (+)   Abdominal   Peds negative pediatric ROS (+)  Hematology negative hematology ROS (+) INR elevated along with LFT's, question liver dz   Anesthesia Other Findings   Reproductive/Obstetrics negative OB ROS                            Anesthesia Physical Anesthesia Plan  ASA: III  Anesthesia Plan: General   Post-op Pain Management:    Induction: Intravenous  Airway Management Planned: Oral ETT  Additional Equipment:   Intra-op Plan:   Post-operative Plan: Extubation in OR  Informed Consent: I have reviewed the patients History and Physical, chart, labs and discussed the procedure including the risks, benefits and alternatives for the proposed anesthesia with the patient or authorized representative who has indicated his/her understanding and acceptance.   Dental advisory given  Plan Discussed with: CRNA and Surgeon  Anesthesia Plan Comments:         Anesthesia Quick Evaluation

## 2015-02-20 NOTE — OR Nursing (Signed)
Pt arrived to OR with foley catheter. Removed at 1330 per Drs orders. Adonis Huguenin RN

## 2015-02-20 NOTE — Progress Notes (Signed)
At around 0230 she developed severe LLQ pain and has been writhing in pain.  Her abdomen is diffusely tender consistent with peritonitis.  WBC is up.  Given the worsening of her condition, I feel she needs emergency laparotomy with partial colectomy and colostomy for her complicated diverticulitis.  I have explained the procedure and risks of the operation.  Risks include but are not limited to bleeding, recurrent infection, wound healing problems, anesthesia, problems with colostomy, need for reoperative surgery,  injury to intraabominal organs (such as intestine, spleen, kidney, bladder, ureter, etc.), chance that colostomy if permanent.  She seems to understand and agrees to proceed.

## 2015-02-20 NOTE — OR Nursing (Signed)
Patient supine for procedure. Urology consult and patientt moved into lithotomy position

## 2015-02-21 ENCOUNTER — Encounter (HOSPITAL_COMMUNITY): Payer: Self-pay | Admitting: General Surgery

## 2015-02-21 LAB — CBC
HCT: 25 % — ABNORMAL LOW (ref 36.0–46.0)
HEMOGLOBIN: 8.1 g/dL — AB (ref 12.0–15.0)
MCH: 27.1 pg (ref 26.0–34.0)
MCHC: 32.4 g/dL (ref 30.0–36.0)
MCV: 83.6 fL (ref 78.0–100.0)
PLATELETS: 344 10*3/uL (ref 150–400)
RBC: 2.99 MIL/uL — ABNORMAL LOW (ref 3.87–5.11)
RDW: 15.1 % (ref 11.5–15.5)
WBC: 16.4 10*3/uL — AB (ref 4.0–10.5)

## 2015-02-21 LAB — BASIC METABOLIC PANEL
ANION GAP: 5 (ref 5–15)
BUN: 13 mg/dL (ref 6–23)
CO2: 27 mmol/L (ref 19–32)
Calcium: 8.2 mg/dL — ABNORMAL LOW (ref 8.4–10.5)
Chloride: 111 mmol/L (ref 96–112)
Creatinine, Ser: 0.38 mg/dL — ABNORMAL LOW (ref 0.50–1.10)
GFR calc Af Amer: >90 mL/min (ref >90)
Glucose, Bld: 209 mg/dL — ABNORMAL HIGH (ref 70–99)
Potassium: 3.7 mmol/L (ref 3.5–5.1)
SODIUM: 143 mmol/L (ref 135–145)

## 2015-02-21 LAB — PREPARE FRESH FROZEN PLASMA
Unit division: 0
Unit division: 0

## 2015-02-21 LAB — PROTIME-INR
INR: 1.54 — AB (ref 0.00–1.49)
Prothrombin Time: 18.7 seconds — ABNORMAL HIGH (ref 11.6–15.2)

## 2015-02-21 MED ORDER — CETYLPYRIDINIUM CHLORIDE 0.05 % MT LIQD
7.0000 mL | Freq: Two times a day (BID) | OROMUCOSAL | Status: DC
  Administered 2015-02-21 – 2015-03-01 (×12): 7 mL via OROMUCOSAL

## 2015-02-21 MED ORDER — VITAMIN K1 10 MG/ML IJ SOLN
5.0000 mg | Freq: Once | INTRAVENOUS | Status: AC
  Administered 2015-02-21: 5 mg via INTRAVENOUS
  Filled 2015-02-21: qty 0.5

## 2015-02-21 MED ORDER — LORAZEPAM 2 MG/ML IJ SOLN
0.5000 mg | INTRAMUSCULAR | Status: DC | PRN
  Administered 2015-02-21: 1 mg via INTRAVENOUS
  Administered 2015-02-22: 0.5 mg via INTRAVENOUS
  Filled 2015-02-21 (×3): qty 1

## 2015-02-21 MED ORDER — CHLORHEXIDINE GLUCONATE 0.12 % MT SOLN
15.0000 mL | Freq: Two times a day (BID) | OROMUCOSAL | Status: DC
  Administered 2015-02-21 – 2015-03-01 (×15): 15 mL via OROMUCOSAL
  Filled 2015-02-21 (×16): qty 15

## 2015-02-21 MED ORDER — KCL IN DEXTROSE-NACL 20-5-0.45 MEQ/L-%-% IV SOLN
INTRAVENOUS | Status: AC
  Administered 2015-02-21 (×2): via INTRAVENOUS
  Administered 2015-02-22 (×2): 1000 mL via INTRAVENOUS
  Administered 2015-02-23: 23:00:00 via INTRAVENOUS
  Administered 2015-02-23 (×2): 1000 mL via INTRAVENOUS
  Administered 2015-02-24 (×2): via INTRAVENOUS
  Filled 2015-02-21 (×12): qty 1000

## 2015-02-21 NOTE — Care Management Note (Signed)
  Page 2 of 2   02/27/2015     3:20:25 PM CARE MANAGEMENT NOTE 02/27/2015  Patient:  Alison Best, Alison Best   Account Number:  [id number]  Date Initiated:  02/21/2015  Documentation initiated by:  Nai Dasch  Subjective/Objective Assessment:   went to or on [id number]  found hypotensive post op placed in sdu     Action/Plan:   tbd/will follow for needs   Anticipated DC Date:  03/02/2015   Anticipated DC Plan:  HOME/SELF CARE  In-house referral  NA      DC Planning Services  CM consult      PAC Choice  NA   Choice offered to / List presented to:  NA   DME arranged  NA      DME agency  NA     [place] arranged  NA      [place] agency  NA   Status of service:  In process, will continue to follow Medicare Important Message given?   (If response is "NO", the following Medicare IM given date fields will be blank) Date Medicare IM given:   Medicare IM given by:   Date Additional Medicare IM given:   Additional Medicare IM given by:    Discharge Disposition:    Per UR Regulation:  Reviewed for med. necessity/level of care/duration of stay  If discussed at [place] of Stay Meetings, dates discussed:    Comments:  Date:  Feb 27, 2015 U.R. performed for needs and level of care. Will continue to follow for Case Management needs.  Velva Harman, RN, BSN, CCM   [phone]   Feb 24, 2015/Layci Stenglein L. Rosana Hoes, RN, BSN, CCM. Case Management [place] [phone] No discharge needs present of time of review.   February 20, 2015/Lillyahna Hemberger L. Rosana Hoes, RN, BSN, CCM. Case Management [place] [phone] No discharge needs present of time of review.

## 2015-02-21 NOTE — Consult Note (Signed)
WOC ostomy consult note Stoma type/location: LLQ Colostomy Stomal assessment/size: 1 1/4" round pink and edematous.  Is flush with abdomen.  Was noted to be leaking, so will be changed today.   Peristomal assessment: Flush stoma.  Midline surgical incision 6 cm from colostomy.  Treatment options for stomal/peristomal skin: Barrier ring due to flush stoma.  Output Liquid brown stool.  Ostomy pouching: 2pc. 2 3/4" system used today with a barrier ring.  May want to consider sizing down  on next pouch change and switch to a 1 piece convex system.  Education provided:  Patient is up to the chair today.  She is sore and groggy.  Pouch is removed and peristomal skin is cleansed with warm water.  Leaking noted around from 3 to 9 o'clock.  Barrier ring is added to peristomal skin.  Feel that 1 piece convex may be best suited for stoma as it appears today and will consider for next pouch change. Discussed peristomal cleansing and purpose behind measuring stoma as well as barrier ring purpose.  Informed her that education will be ongoing.  [place] team will continue to follow.  Domenic Moras RN BSN [place] Pager [phone]

## 2015-02-21 NOTE — Progress Notes (Signed)
1 Day Post-Op  Subjective: Less pain now then she had immediately before the surgery.  Anxious.  Objective: Vital signs in last 24 hours: Temp:  [98.8 F (37.1 C)-100.8 F (38.2 C)] 99.2 F (37.3 C) (04/29 0400) Pulse Rate:  [77-116] 77 (04/29 0600) Resp:  [12-28] 26 (04/29 0600) BP: (98-132)/(32-85) 104/40 mmHg (04/29 0600) SpO2:  [82 %-100 %] 95 % (04/29 0600) Last BM Date: 02/20/15  Intake/Output from previous day: 04/28 0701 - 04/29 0700 In: 5945.1 [P.O.:120; I.V.:5475.1; IV Piggyback:350] Out: 6712 [Urine:860; Drains:205] Intake/Output this shift:    PE: General- In NAD Abdomen-slightly firm and distended, dressing dry, colostomy purple and edematous with some brown liquid output  Lab Results:   Recent Labs  02/20/15 0515 02/21/15 0335  WBC 21.8* 16.4*  HGB 10.1* 8.1*  HCT 31.6* 25.0*  PLT 447* 344   BMET  Recent Labs  02/19/15 0540 02/21/15 0335  NA 138 143  K 3.4* 3.7  CL 105 111  CO2 22 27  GLUCOSE 127* 209*  BUN 16 13  CREATININE 0.55 0.38*  CALCIUM 8.8 8.2*   PT/INR  Recent Labs  02/20/15 0802 02/21/15 0335  LABPROT 21.4* 18.7*  INR 1.84* 1.54*   Comprehensive Metabolic Panel:    Component Value Date/Time   NA 143 02/21/2015 0335   NA 138 02/19/2015 0540   K 3.7 02/21/2015 0335   K 3.4* 02/19/2015 0540   CL 111 02/21/2015 0335   CL 105 02/19/2015 0540   CO2 27 02/21/2015 0335   CO2 22 02/19/2015 0540   BUN 13 02/21/2015 0335   BUN 16 02/19/2015 0540   CREATININE 0.38* 02/21/2015 0335   CREATININE 0.55 02/19/2015 0540   GLUCOSE 209* 02/21/2015 0335   GLUCOSE 127* 02/19/2015 0540   CALCIUM 8.2* 02/21/2015 0335   CALCIUM 8.8 02/19/2015 0540   AST 62* 02/18/2015 1354   AST 21 05/23/2014 0933   ALT 61* 02/18/2015 1354   ALT 26 05/23/2014 0933   ALKPHOS 120* 02/18/2015 1354   ALKPHOS 98 05/23/2014 0933   BILITOT 0.9 02/18/2015 1354   BILITOT 0.4 05/23/2014 0933   PROT 7.4 02/18/2015 1354   PROT 6.8 05/23/2014 0933    ALBUMIN 3.1* 02/18/2015 1354   ALBUMIN 3.7 05/23/2014 0933     Studies/Results: Dg Abd Portable 1v  02/20/2015   CLINICAL DATA:  Lower abdominal pain  EXAM: PORTABLE ABDOMEN - 1 VIEW  COMPARISON:  CT abdomen and pelvis February 18, 2015  FINDINGS: A drain is noted in the pelvis from right-sided approach. There is no bowel dilatation or air-fluid level suggesting obstruction. No free air is seen. No abnormal calcifications.  IMPRESSION: Bowel gas pattern unremarkable.  Drain in pelvis noted.   Electronically Signed   By: Lowella Grip III M.D.   On: 02/20/2015 09:53   Ct Image Guided Drainage Percut Cath  Peritoneal Retroperit  02/19/2015   CLINICAL DATA:  Pelvic abscess  EXAM: CT CORE BIOPSY RENAL  FLUOROSCOPY TIME:  None  MEDICATIONS AND MEDICAL HISTORY: Versed 3 mg, Fentanyl 50 mcg.  Additional Medications: None.  ANESTHESIA/SEDATION: Moderate sedation time: 11 minutes  CONTRAST:  None  PROCEDURE: The procedure, risks, benefits, and alternatives were explained to the patient. Questions regarding the procedure were encouraged and answered. The patient understands and consents to the procedure.  The right gluteal region in the prone position was prepped with Betadine in a sterile fashion, and a sterile drape was applied covering the operative field. A sterile gown and sterile gloves  were used for the procedure.  Under CT guidance, an 18 gauge needle was inserted into the pelvic abscess via right trans gluteal approach and removed over an Amplatz. A 12 French dilator followed by a 12 [place] drain were inserted. It was looped and string fixed and sewn to the skin. Cloudy sanguinous fluid was aspirated.  FINDINGS: Images document drain placement into a right pelvic abscess via right trans gluteal approach.  COMPLICATIONS: None  IMPRESSION: Successful right pelvic trans gluteal 12 French abscess drain.   Electronically Signed   By: Marybelle Killings M.D.   On: 02/19/2015 15:51     Anti-infectives: Anti-infectives    Start     Dose/Rate Route Frequency Ordered Stop   02/20/15 2000  piperacillin-tazobactam (ZOSYN) IVPB 3.375 g     3.375 g 12.5 mL/hr over 240 Minutes Intravenous Every 8 hours 02/20/15 1620     02/18/15 1715  piperacillin-tazobactam (ZOSYN) IVPB 3.375 g  Status:  Discontinued     3.375 g 12.5 mL/hr over 240 Minutes Intravenous 3 times per day 02/18/15 1706 02/20/15 1620   02/18/15 1615  Ampicillin-Sulbactam (UNASYN) 3 g in sodium chloride 0.9 % 100 mL IVPB  Status:  Discontinued     3 g 100 mL/hr over 60 Minutes Intravenous  Once 02/18/15 1604 02/18/15 1624      Assessment Active Problems: 1.Complicated sigmoid diverticulitis with perforation and abscesses s/p Hartmann procedure and LSO 02/20/15 (Tasmia Blumer)-stable overnight; operation discussed with her.  2.  Unexplained coagulopathy-has not taken any herbals recently; may be due to low grade sepsis  3.  ABL anemia  4.  Anxiety    LOS: 3 days   Plan: Give another dose of Vitamin K today.  OOB to chair.  Leave in SDU.  Ativan prn for anxiety.   Nhung Danko Lenna Sciara 02/21/2015

## 2015-02-22 LAB — CBC
HCT: 23.9 % — ABNORMAL LOW (ref 36.0–46.0)
Hemoglobin: 7.6 g/dL — ABNORMAL LOW (ref 12.0–15.0)
MCH: 26.9 pg (ref 26.0–34.0)
MCHC: 31.8 g/dL (ref 30.0–36.0)
MCV: 84.5 fL (ref 78.0–100.0)
PLATELETS: 339 10*3/uL (ref 150–400)
RBC: 2.83 MIL/uL — ABNORMAL LOW (ref 3.87–5.11)
RDW: 15.2 % (ref 11.5–15.5)
WBC: 19.6 10*3/uL — ABNORMAL HIGH (ref 4.0–10.5)

## 2015-02-22 LAB — BASIC METABOLIC PANEL
ANION GAP: 9 (ref 5–15)
BUN: 18 mg/dL (ref 6–23)
CALCIUM: 8.3 mg/dL — AB (ref 8.4–10.5)
CO2: 23 mmol/L (ref 19–32)
CREATININE: 0.36 mg/dL — AB (ref 0.50–1.10)
Chloride: 110 mmol/L (ref 96–112)
GFR calc Af Amer: >90 mL/min (ref >90)
GFR calc non Af Amer: >90 mL/min (ref >90)
GLUCOSE: 139 mg/dL — AB (ref 70–99)
Potassium: 4.4 mmol/L (ref 3.5–5.1)
Sodium: 142 mmol/L (ref 135–145)

## 2015-02-22 LAB — URINE CULTURE

## 2015-02-22 LAB — PROTIME-INR
INR: 1.27 (ref 0.00–1.49)
Prothrombin Time: 16.1 seconds — ABNORMAL HIGH (ref 11.6–15.2)

## 2015-02-22 MED ORDER — SODIUM CHLORIDE 0.9 % IV BOLUS (SEPSIS): 500.0000 mL | Freq: Once | INTRAVENOUS | Status: DC

## 2015-02-22 NOTE — Progress Notes (Signed)
Patient ID: Alison Best, female   DOB: [birth year], 59 y.o.   MRN: [id number] 2 Days Post-Op  Subjective: Drowsy but easily arousable. Pretty good pain control. No other complaints.  Objective: Vital signs in last 24 hours: Temp:  [98 F (36.7 C)-98.7 F (37.1 C)] 98.2 F (36.8 C) (04/30 0442) Pulse Rate:  [63-99] 92 (04/30 0600) Resp:  [11-27] 16 (04/30 0600) BP: (97-119)/(32-59) 119/53 mmHg (04/30 0600) SpO2:  [93 %-98 %] 97 % (04/30 0600) Weight:  [89.2 kg (196 lb 10.4 oz)] 89.2 kg (196 lb 10.4 oz) (04/30 0442) Last BM Date: 02/21/15  Intake/Output from previous day: 04/29 0701 - 04/30 0700 In: 2750.7 [P.O.:220; I.V.:2430.7; IV Piggyback:100] Out: 440 [Urine:340; Drains:100] Intake/Output this shift:    General appearance: alert, cooperative and moderately obese Resp: clear to auscultation bilaterally GI: mild appropriate tenderness. Colostomy left lower quadrant appears somewhat retracted Incision/Wound: midline dressing clean and dry  Lab Results:   Recent Labs  02/21/15 0335 02/22/15 0350  WBC 16.4* 19.6*  HGB 8.1* 7.6*  HCT 25.0* 23.9*  PLT 344 339   BMET  Recent Labs  02/21/15 0335 02/22/15 0350  NA 143 142  K 3.7 4.4  CL 111 110  CO2 27 23  GLUCOSE 209* 139*  BUN 13 18  CREATININE 0.38* 0.36*  CALCIUM 8.2* 8.3*     Studies/Results: Dg Abd Portable 1v  02/20/2015   CLINICAL DATA:  Lower abdominal pain  EXAM: PORTABLE ABDOMEN - 1 VIEW  COMPARISON:  CT abdomen and pelvis February 18, 2015  FINDINGS: A drain is noted in the pelvis from right-sided approach. There is no bowel dilatation or air-fluid level suggesting obstruction. No free air is seen. No abnormal calcifications.  IMPRESSION: Bowel gas pattern unremarkable.  Drain in pelvis noted.   Electronically Signed   By: Lowella Grip III M.D.   On: 02/20/2015 09:53    Anti-infectives: Anti-infectives    Start     Dose/Rate Route Frequency Ordered Stop   02/20/15 2000   piperacillin-tazobactam (ZOSYN) IVPB 3.375 g     3.375 g 12.5 mL/hr over 240 Minutes Intravenous Every 8 hours 02/20/15 1620     02/18/15 1715  piperacillin-tazobactam (ZOSYN) IVPB 3.375 g  Status:  Discontinued     3.375 g 12.5 mL/hr over 240 Minutes Intravenous 3 times per day 02/18/15 1706 02/20/15 1620   02/18/15 1615  Ampicillin-Sulbactam (UNASYN) 3 g in sodium chloride 0.9 % 100 mL IVPB  Status:  Discontinued     3 g 100 mL/hr over 60 Minutes Intravenous  Once 02/18/15 1604 02/18/15 1624      Assessment/Plan: s/p Procedure(s): Emergency EXPLORATORY and drainiage of interabdominal abcesses SALPINGO OOPHORECTOMY COLOSTOMY Sigmoid Colectomy CYSTOSCOPY Stable postoperatively. Continue IV antibiotics, wound care, continue nothing by mouth and NG   LOS: 4 days    Sumner Boesch T 02/22/2015

## 2015-02-23 DIAGNOSIS — K5732 Diverticulitis of large intestine without perforation or abscess without bleeding: Secondary | ICD-10-CM

## 2015-02-23 HISTORY — DX: Diverticulitis of large intestine without perforation or abscess without bleeding: K57.32

## 2015-02-23 LAB — CULTURE, ROUTINE-ABSCESS: CULTURE: NO GROWTH

## 2015-02-23 LAB — CBC
HCT: 24.7 % — ABNORMAL LOW (ref 36.0–46.0)
Hemoglobin: 8.1 g/dL — ABNORMAL LOW (ref 12.0–15.0)
MCH: 27.8 pg (ref 26.0–34.0)
MCHC: 32.8 g/dL (ref 30.0–36.0)
MCV: 84.9 fL (ref 78.0–100.0)
PLATELETS: 367 10*3/uL (ref 150–400)
RBC: 2.91 MIL/uL — AB (ref 3.87–5.11)
RDW: 15.4 % (ref 11.5–15.5)
WBC: 13.3 10*3/uL — ABNORMAL HIGH (ref 4.0–10.5)

## 2015-02-23 NOTE — Progress Notes (Signed)
Patient ID: Alison Best, female   DOB: [birth year], 59 y.o.   MRN: [id number] 3 Days Post-Op  Subjective: Feels somewhat better today. Less pain.  Objective: Vital signs in last 24 hours: Temp:  [97.6 F (36.4 C)-98.1 F (36.7 C)] 97.9 F (36.6 C) (05/01 0400) Pulse Rate:  [53-101] 53 (05/01 0600) Resp:  [10-28] 10 (05/01 0600) BP: (71-129)/(28-61) 110/44 mmHg (05/01 0414) SpO2:  [96 %-100 %] 100 % (05/01 0600) Last BM Date: 02/21/15  Intake/Output from previous day: 04/30 0701 - 05/01 0700 In: 2375 [I.V.:2250; IV Piggyback:125] Out: 845 [Urine:785; Drains:60] Intake/Output this shift:    General appearance: alert, cooperative, no distress and llooks overall better than yesterday Resp: clear to auscultation bilaterally GI: normal findings: soft, non-tender and colostomy is retracted and difficult to visualize Incision/Wound: dressing clean and dry  Lab Results:   Recent Labs  02/22/15 0350 02/23/15 0405  WBC 19.6* 13.3*  HGB 7.6* 8.1*  HCT 23.9* 24.7*  PLT 339 367   BMET  Recent Labs  02/21/15 0335 02/22/15 0350  NA 143 142  K 3.7 4.4  CL 111 110  CO2 27 23  GLUCOSE 209* 139*  BUN 13 18  CREATININE 0.38* 0.36*  CALCIUM 8.2* 8.3*   Recent Results (from the past 240 hour(s))  Urine culture     Status: None   Collection Time: 02/18/15  1:41 PM  Result Value Ref Range Status   Specimen Description URINE, RANDOM  Final   Special Requests NONE  Final   Colony Count   Final    75,000 COLONIES/ML Performed at [facility]    Culture   Final    CITROBACTER FREUNDII ENTEROCOCCUS SPECIES Performed at [facility]    Report Status 02/22/2015 FINAL  Final   Organism ID, Bacteria CITROBACTER FREUNDII  Final   Organism ID, Bacteria ENTEROCOCCUS SPECIES  Final      Susceptibility   Citrobacter freundii - MIC*    CEFAZOLIN >=64 RESISTANT Resistant     CEFTRIAXONE <=1 SENSITIVE Sensitive     CIPROFLOXACIN <=0.25 SENSITIVE Sensitive      GENTAMICIN <=1 SENSITIVE Sensitive     LEVOFLOXACIN <=0.12 SENSITIVE Sensitive     NITROFURANTOIN <=16 SENSITIVE Sensitive     TOBRAMYCIN <=1 SENSITIVE Sensitive     TRIMETH/SULFA >=320 RESISTANT Resistant     PIP/TAZO <=4 SENSITIVE Sensitive     * CITROBACTER FREUNDII   Enterococcus species - MIC*    AMPICILLIN <=2 SENSITIVE Sensitive     LEVOFLOXACIN 1 SENSITIVE Sensitive     NITROFURANTOIN <=16 SENSITIVE Sensitive     VANCOMYCIN 1 SENSITIVE Sensitive     TETRACYCLINE <=1 SENSITIVE Sensitive     * ENTEROCOCCUS SPECIES  Culture, routine-abscess     Status: None   Collection Time: 02/19/15  1:08 PM  Result Value Ref Range Status   Specimen Description ABSCESS RIGHT TRANSGLUTEAL  Final   Special Requests NONE  Final   Gram Stain   Final    RARE WBC PRESENT,BOTH PMN AND MONONUCLEAR NO SQUAMOUS EPITHELIAL CELLS SEEN NO ORGANISMS SEEN Performed at [facility]    Culture   Final    NO GROWTH 3 DAYS Performed at [facility]    Report Status 02/23/2015 FINAL  Final  Surgical pcr screen     Status: Abnormal   Collection Time: 02/20/15  8:40 AM  Result Value Ref Range Status   MRSA, PCR NEGATIVE NEGATIVE Final   Staphylococcus aureus POSITIVE (A) NEGATIVE  Final    Comment:        The Xpert SA Assay (FDA approved for NASAL specimens in patients over 63 years of age), is one component of a comprehensive surveillance program.  Test performance has been validated by [facility] for patients greater than or equal to 45 year old. It is not intended to diagnose infection nor to guide or monitor treatment.     Studies/Results: No results found.  Anti-infectives: Anti-infectives    Start     Dose/Rate Route Frequency Ordered Stop   02/20/15 2000  piperacillin-tazobactam (ZOSYN) IVPB 3.375 g     3.375 g 12.5 mL/hr over 240 Minutes Intravenous Every 8 hours 02/20/15 1620     02/18/15 1715  piperacillin-tazobactam (ZOSYN) IVPB 3.375 g  Status:  Discontinued      3.375 g 12.5 mL/hr over 240 Minutes Intravenous 3 times per day 02/18/15 1706 02/20/15 1620   02/18/15 1615  Ampicillin-Sulbactam (UNASYN) 3 g in sodium chloride 0.9 % 100 mL IVPB  Status:  Discontinued     3 g 100 mL/hr over 60 Minutes Intravenous  Once 02/18/15 1604 02/18/15 1624      Assessment/Plan: s/p Procedure(s): Emergency EXPLORATORY and drainiage of interabdominal abcesses SALPINGO OOPHORECTOMY COLOSTOMY Sigmoid Colectomy CYSTOSCOPY Stable postoperatively. Improving signs of infection with improving leukocytosis. Continue IV antibiotics, NG tube and bowel rest until bowel function Discontinue Foley. Continue to mobilize   LOS: 5 days    Chandan Fly T 02/23/2015

## 2015-02-24 LAB — BASIC METABOLIC PANEL
ANION GAP: 5 (ref 5–15)
BUN: 6 mg/dL (ref 6–20)
CHLORIDE: 106 mmol/L (ref 101–111)
CO2: 28 mmol/L (ref 22–32)
Calcium: 8.2 mg/dL — ABNORMAL LOW (ref 8.9–10.3)
Creatinine, Ser: 0.39 mg/dL — ABNORMAL LOW (ref 0.44–1.00)
GFR calc Af Amer: >60 mL/min (ref >60)
GFR calc non Af Amer: >60 mL/min (ref >60)
Glucose, Bld: 89 mg/dL (ref 70–99)
POTASSIUM: 3.9 mmol/L (ref 3.5–5.1)
Sodium: 139 mmol/L (ref 135–145)

## 2015-02-24 LAB — CBC
HEMATOCRIT: 25 % — AB (ref 36.0–46.0)
Hemoglobin: 8 g/dL — ABNORMAL LOW (ref 12.0–15.0)
MCH: 26.8 pg (ref 26.0–34.0)
MCHC: 32 g/dL (ref 30.0–36.0)
MCV: 83.9 fL (ref 78.0–100.0)
Platelets: 342 10*3/uL (ref 150–400)
RBC: 2.98 MIL/uL — AB (ref 3.87–5.11)
RDW: 14.7 % (ref 11.5–15.5)
WBC: 15.6 10*3/uL — AB (ref 4.0–10.5)

## 2015-02-24 LAB — TYPE AND SCREEN
ABO/RH(D): O POS
Antibody Screen: POSITIVE
DAT, IGG: NEGATIVE
Donor AG Type: NEGATIVE
Donor AG Type: NEGATIVE
PT AG Type: NEGATIVE
UNIT DIVISION: 0
Unit division: 0

## 2015-02-24 NOTE — Progress Notes (Signed)
4 Days Post-Op  Subjective: Pt with no acute changes.  No ostomy function  Objective: Vital signs in last 24 hours: Temp:  [97.8 F (36.6 C)-98.4 F (36.9 C)] 98.1 F (36.7 C) (05/02 0800) Pulse Rate:  [60-87] 79 (05/02 0402) Resp:  [13-22] 15 (05/02 0800) BP: (100-131)/(26-95) 118/26 mmHg (05/02 0402) SpO2:  [90 %-100 %] 100 % (05/02 0800) Last BM Date: 02/21/15  Intake/Output from previous day: 05/01 0701 - 05/02 0700 In: 2712.1 [I.V.:2462.1; NG/GT:30; IV Piggyback:150] Out: 5498 [Urine:3375; Emesis/NG output:350; Drains:10] Intake/Output this shift: Total I/O In: -  Out: 250 [Urine:250]  General appearance: alert and cooperative GI: soft, nttp, nd, wound c/d/i, ostomy pink  Lab Results:   Recent Labs  02/23/15 0405 02/24/15 0400  WBC 13.3* 15.6*  HGB 8.1* 8.0*  HCT 24.7* 25.0*  PLT 367 342   BMET  Recent Labs  02/22/15 0350 02/24/15 0400  NA 142 139  K 4.4 3.9  CL 110 106  CO2 23 28  GLUCOSE 139* 89  BUN 18 6  CREATININE 0.36* 0.39*  CALCIUM 8.3* 8.2*   PT/INR  Recent Labs  02/22/15 0350  LABPROT 16.1*  INR 1.27   ABG No results for input(s): PHART, HCO3 in the last 72 hours.  Invalid input(s): PCO2, PO2  Studies/Results: No results found.  Anti-infectives: Anti-infectives    Start     Dose/Rate Route Frequency Ordered Stop   02/20/15 2000  piperacillin-tazobactam (ZOSYN) IVPB 3.375 g     3.375 g 12.5 mL/hr over 240 Minutes Intravenous Every 8 hours 02/20/15 1620     02/18/15 1715  piperacillin-tazobactam (ZOSYN) IVPB 3.375 g  Status:  Discontinued     3.375 g 12.5 mL/hr over 240 Minutes Intravenous 3 times per day 02/18/15 1706 02/20/15 1620   02/18/15 1615  Ampicillin-Sulbactam (UNASYN) 3 g in sodium chloride 0.9 % 100 mL IVPB  Status:  Discontinued     3 g 100 mL/hr over 60 Minutes Intravenous  Once 02/18/15 1604 02/18/15 1624      Assessment/Plan: s/p Procedure(s): Emergency EXPLORATORY and drainiage of interabdominal  abcesses (N/A) SALPINGO OOPHORECTOMY (Left) COLOSTOMY (N/A) Sigmoid Colectomy CYSTOSCOPY (N/A) Cont' abx WBC up today if con't to rise in AM will order CT to r/o abscess PT RN ostomy to educate  LOS: 6 days    Rosario Jacks., [facility] 02/24/2015

## 2015-02-24 NOTE — Consult Note (Signed)
WOC ostomy follow up Stoma type/location: LLQ COlostomy Stomal assessment/size: 1 and 1/2 inch slightly retracted stoma with sutures intact Peristomal assessment: intact Treatment options for stomal/peristomal skin: skin barrier ring Output nothing in pouch today.  NGT in place. Ostomy pouching: 1pc.convex ostomy pouching system that cuts to 2 inches used today and cut off center to avoid proximity to midline wound. Supplies ordered are 1 and 1/2 inch size Alison Best [phone]) and are to be used with skin barrier ring Alison Best [phone]).  Education provided: Patient is still in ICU, remains tearful and somewhat anxious about ostomy and states that she cannot focus or recall things due to pain medicine and discomfort with NGT.  Reassured that continued teaching will occur once she is moved onto the regular floor and when some of her monitoring aides are discontinued and NGT removed.She is comforted by that.  I would suggest a HHRN for continued follow up after discharge.  If you agree, please order. Today I demonstrate the Lock and Roll closure feature of the 1-piece ostomy product and she is able to give a return demonstration. Teaching included that the pouching systems are odor-proof, waterproof and that she will be able to shower and bathe as she did prior to surgery.  Taught that ostomy pouching systems are ideally changed every 2-3 days and that in-between times, they are emptied. Patient noted to be dozing off as I complete teaching today. Enrolled patient in [place] Start Discharge program: No WOC nursing team will follow, and will remain available to this patient, the nursing, surgical and medical teams. Thanks, Alison Flakes, MSN, RN, [place], [place], [place] [phone])

## 2015-02-24 NOTE — Progress Notes (Signed)
24 hour PCA dose: 11.87 mg.  Iantha Fallen RN 5:01 PM 02/24/2015

## 2015-02-25 ENCOUNTER — Inpatient Hospital Stay (HOSPITAL_COMMUNITY): Payer: BLUE CROSS/BLUE SHIELD

## 2015-02-25 ENCOUNTER — Encounter (HOSPITAL_COMMUNITY): Payer: Self-pay | Admitting: Radiology

## 2015-02-25 LAB — CBC
HCT: 24.9 % — ABNORMAL LOW (ref 36.0–46.0)
HEMOGLOBIN: 8.2 g/dL — AB (ref 12.0–15.0)
MCH: 27.5 pg (ref 26.0–34.0)
MCHC: 32.9 g/dL (ref 30.0–36.0)
MCV: 83.6 fL (ref 78.0–100.0)
PLATELETS: 358 10*3/uL (ref 150–400)
RBC: 2.98 MIL/uL — AB (ref 3.87–5.11)
RDW: 14.9 % (ref 11.5–15.5)
WBC: 21.2 10*3/uL — AB (ref 4.0–10.5)

## 2015-02-25 LAB — TSH: TSH: 0.016 u[IU]/mL — ABNORMAL LOW (ref 0.350–4.500)

## 2015-02-25 LAB — PROTIME-INR
INR: 1.22 (ref 0.00–1.49)
PROTHROMBIN TIME: 15.5 s — AB (ref 11.6–15.2)

## 2015-02-25 LAB — HEPATIC FUNCTION PANEL
ALBUMIN: 2 g/dL — AB (ref 3.5–5.0)
ALT: 14 U/L (ref 14–54)
AST: 14 U/L — ABNORMAL LOW (ref 15–41)
Alkaline Phosphatase: 71 U/L (ref 38–126)
Bilirubin, Direct: 0.4 mg/dL (ref 0.1–0.5)
Indirect Bilirubin: 0.6 mg/dL (ref 0.3–0.9)
TOTAL PROTEIN: 5 g/dL — AB (ref 6.5–8.1)
Total Bilirubin: 1 mg/dL (ref 0.3–1.2)

## 2015-02-25 LAB — GLUCOSE, CAPILLARY: Glucose-Capillary: 100 mg/dL — ABNORMAL HIGH (ref 70–99)

## 2015-02-25 LAB — PREALBUMIN: PREALBUMIN: 10.5 mg/dL — AB (ref 18–38)

## 2015-02-25 MED ORDER — CLINIMIX E/DEXTROSE (5/15) 5 % IV SOLN
INTRAVENOUS | Status: AC
  Administered 2015-02-25: 19:00:00 via INTRAVENOUS
  Filled 2015-02-25: qty 960

## 2015-02-25 MED ORDER — IOHEXOL 300 MG/ML  SOLN
100.0000 mL | Freq: Once | INTRAMUSCULAR | Status: AC | PRN
  Administered 2015-02-25: 100 mL via INTRAVENOUS

## 2015-02-25 MED ORDER — SODIUM CHLORIDE 0.9 % IJ SOLN
10.0000 mL | Freq: Two times a day (BID) | INTRAMUSCULAR | Status: DC
  Administered 2015-02-25 – 2015-03-01 (×7): 10 mL

## 2015-02-25 MED ORDER — SODIUM CHLORIDE 0.9 % IJ SOLN
10.0000 mL | INTRAMUSCULAR | Status: DC | PRN
  Administered 2015-03-03 – 2015-03-05 (×4): 10 mL
  Filled 2015-02-25 (×4): qty 40

## 2015-02-25 MED ORDER — FAT EMULSION 20 % IV EMUL
240.0000 mL | INTRAVENOUS | Status: AC
  Administered 2015-02-25: 240 mL via INTRAVENOUS
  Filled 2015-02-25: qty 250

## 2015-02-25 MED ORDER — INSULIN ASPART 100 UNIT/ML ~~LOC~~ SOLN
0.0000 [IU] | Freq: Three times a day (TID) | SUBCUTANEOUS | Status: DC
  Administered 2015-02-26 – 2015-03-01 (×2): 1 [IU] via SUBCUTANEOUS

## 2015-02-25 MED ORDER — KCL IN DEXTROSE-NACL 20-5-0.45 MEQ/L-%-% IV SOLN
INTRAVENOUS | Status: AC
  Administered 2015-02-25: 13:00:00 via INTRAVENOUS
  Administered 2015-02-27: 1000 mL via INTRAVENOUS
  Filled 2015-02-25: qty 1000

## 2015-02-25 NOTE — Progress Notes (Signed)
5 Days Post-Op  Subjective: Upset with noise from the NG, which is fairly constant..  ? Flatus, Not doing IS very frequently.  She reports she was not taking meds for diabetes, hyperthyroid, or using CPAP prior to admit. She was smoking up to 1 month prior to admit. BP  Was down last PM and up some this AM.   Objective: Vital signs in last 24 hours: Temp:  [98.1 F (36.7 C)-98.6 F (37 C)] 98.1 F (36.7 C) (05/03 0400) Pulse Rate:  [63-90] 63 (05/03 0600) Resp:  [12-28] 17 (05/03 0600) BP: (73-118)/(22-35) 73/27 mmHg (05/03 0000) SpO2:  [88 %-100 %] 98 % (05/03 0600) Weight:  [84.9 kg (187 lb 2.7 oz)] 84.9 kg (187 lb 2.7 oz) (05/03 0400) Last BM Date: 02/18/15 NPO NG 300 Drain 30 ml Afebrile, VSS till last PM, NO BP recorded since  73/27 last PM at 2300  SBP 104 this AM. WBC up to 21.2. No films Intake/Output from previous day: 05/02 0701 - 05/03 0700 In: 2454.4 [I.V.:2304.4; IV Piggyback:150] Out: 3520 [Urine:3190; Emesis/NG output:300; Drains:30] Intake/Output this shift:    General appearance: alert, cooperative, no distress and anxious. Resp: clear to auscultation bilaterally and few rales in bases. Cardio: regular rate and rhythm, S1, S2 normal, no murmur, click, rub or gallop GI: Wound is soupy, she doesn't know when it was changed, no BS, some gas in ostomy, no stool, just some sweat.    Lab Results:   Recent Labs  02/23/15 0405 02/24/15 0400  WBC 13.3* 15.6*  HGB 8.1* 8.0*  HCT 24.7* 25.0*  PLT 367 342    BMET  Recent Labs  02/24/15 0400  NA 139  K 3.9  CL 106  CO2 28  GLUCOSE 89  BUN 6  CREATININE 0.39*  CALCIUM 8.2*   PT/INR No results for input(s): LABPROT, INR in the last 72 hours.   Recent Labs Lab 02/18/15 1354  AST 62*  ALT 61*  ALKPHOS 120*  BILITOT 0.9  PROT 7.4  ALBUMIN 3.1*     Lipase     Component Value Date/Time   LIPASE 26 02/18/2015 1354     Studies/Results: No results found.  Medications: . antiseptic oral  rinse  7 mL Mouth Rinse q12n4p  . chlorhexidine  15 mL Mouth Rinse BID  . enoxaparin (LOVENOX) injection  40 mg Subcutaneous Q24H  . morphine   Intravenous 6 times per day  . pantoprazole (PROTONIX) IV  40 mg Intravenous Q24H  . piperacillin-tazobactam (ZOSYN)  IV  3.375 g Intravenous Q8H  . sodium chloride  500 mL Intravenous Once   . dextrose 5 % and 0.45 % NaCl with KCl 20 mEq/L 100 mL/hr at 02/24/15 1949  . dextrose 5% lactated ringers Stopped (02/23/15 1900)   Prior to Admission medications   Medication Sig Start Date End Date Taking? Authorizing Provider  acetaminophen (TYLENOL) 500 MG tablet Take 1,000 mg by mouth every 6 (six) hours as needed for moderate pain or headache.   Yes Historical Provider, MD  albuterol (PROAIR HFA) 108 (90 BASE) MCG/ACT inhaler Inhale 2 puffs into the lungs every 4 (four) hours as needed for wheezing. 04/22/14 05/24/16 Yes Laurey Morale, MD  celecoxib (CELEBREX) 200 MG capsule Take 200 mg by mouth daily.   Yes Historical Provider, MD  EPINEPHrine 0.3 mg/0.3 mL IJ SOAJ injection Inject 0.3 mLs (0.3 mg total) into the muscle once. 04/22/14  Yes Laurey Morale, MD  fenofibrate 160 MG tablet Take 1 tablet (  160 mg total) by mouth daily. 05/28/14 06/22/15 Yes Laurey Morale, MD  furosemide (LASIX) 20 MG tablet TAKE 1 TABLET BY MOUTH DAILY AS NEEDED (FLUID) 05/28/14  Yes Laurey Morale, MD  metFORMIN (GLUCOPHAGE) 500 MG tablet Take 1 tablet (500 mg total) by mouth 2 (two) times daily with a meal. 05/28/14  Yes Laurey Morale, MD  methimazole (TAPAZOLE) 5 MG tablet TAKE 1 TABLET BY MOUTH DAILY 01/27/15  Yes Philemon Kingdom, MD  amoxicillin-clavulanate (AUGMENTIN) 875-125 MG per tablet Take 1 tablet by mouth 2 (two) times daily. Patient not taking: Reported on 02/18/2015 12/09/14   Laurey Morale, MD  metroNIDAZOLE (FLAGYL) 500 MG tablet Take 1 tablet (500 mg total) by mouth 2 (two) times daily. Patient not taking: Reported on 02/18/2015 12/09/14   Laurey Morale, MD      Assessment/Plan Perforated sigmoid diverticulitis with intra-abdominal abscesses S/p Emergency exploratory laparotomy, drainage of intra-abdominal abscesses, sigmoid colectomy, left salpingo-oophorectomy, descending colostomy. 02/20/15, Dr. Jackolyn Confer Body mass index is 35.38  Sleep apnea Type II diabetes- not taking meds preop Hyperthyroid - not taking meds pre op Hyperlipidemia DVT:  Lovenox/SCD Day 8 Zosyn  Hx of tobacco use  Plan:  Repeat CT, mobilize, IS q1h, clamp NG and see how she does, OT/PT consults, check INR, prealbumin, HgbA1C, TSH, LFTS, TNA. Increase dressing changes to tid.       LOS: 7 days    Cora Stetson 02/25/2015

## 2015-02-25 NOTE — Progress Notes (Signed)
NUTRITION FOLLOW UP  Intervention:   - TPN per pharmacy. - RD will continue to monitor  Nutrition Dx:   Inadequate oral intake related to bowel rest as evidenced by NPO; ongoing  Goal:   Pt to meet >/= 90% of their estimated nutrition needs; not met  Monitor:   Weight, labs, TPN initiation, I/O's, diet advancement  Assessment:   59 year old female with a history of 6-7 bouts of diverticulitis in the past. About 2 weeks ago, while on a cruise ship, she noted some lower abdominal discomfort and a ribbon like stools.  4/27: Pt with poor appetite x 10 days and 40 lb intentional weight loss. Pt followed by RDs at [facility].  Pt currently NPO for bowel rest. RD to follow-up once diet advanced and address education needs.  5/3: Consult received for new TPN.  PICC placed 5/3.  Plan per pharmacy At 1800 today:  Start Clinimix 5/15 at 40 ml/hr.  20% fat emulsion at 10 ml/hr   Labs and medications reviewed  Height: Ht Readings from Last 1 Encounters:  02/18/15 5' 1"  (1.549 m)    Weight Status:   Wt Readings from Last 1 Encounters:  02/25/15 187 lb 2.7 oz (84.9 kg)    Re-estimated needs:  Kcal: 1500-1700 Protein: 95-105 g Fluid: Per MD  Skin: closed incision on abdomen, closed incision on buttocks  Diet Order: Diet NPO time specified Except for: Ice Chips TPN (CLINIMIX-E) Adult   Intake/Output Summary (Last 24 hours) at 02/25/15 1459 Last data filed at 02/25/15 1400  Gross per 24 hour  Intake 2304.37 ml  Output   3880 ml  Net -1575.63 ml    Last BM: 4/29- colostomy output   Labs:   Recent Labs Lab 02/21/15 0335 02/22/15 0350 02/24/15 0400  NA 143 142 139  K 3.7 4.4 3.9  CL 111 110 106  CO2 27 23 28   BUN 13 18 6   CREATININE 0.38* 0.36* 0.39*  CALCIUM 8.2* 8.3* 8.2*  GLUCOSE 209* 139* 89    CBG (last 3)  No results for input(s): GLUCAP in the last 72 hours.  Scheduled Meds: . antiseptic oral rinse  7 mL Mouth Rinse q12n4p  . chlorhexidine  15  mL Mouth Rinse BID  . enoxaparin (LOVENOX) injection  40 mg Subcutaneous Q24H  . insulin aspart  0-9 Units Subcutaneous 3 times per day  . morphine   Intravenous 6 times per day  . pantoprazole (PROTONIX) IV  40 mg Intravenous Q24H  . piperacillin-tazobactam (ZOSYN)  IV  3.375 g Intravenous Q8H  . sodium chloride  500 mL Intravenous Once  . sodium chloride  10-40 mL Intracatheter Q12H    Continuous Infusions: . dextrose 5 % and 0.45 % NaCl with KCl 20 mEq/L 100 mL/hr at 02/24/15 1949  . dextrose 5 % and 0.45 % NaCl with KCl 20 mEq/L 50 mL/hr at 02/25/15 1244  . Marland KitchenTPN (CLINIMIX-E) Adult     And  . fat emulsion      Laurette Schimke MS, [place], LDN [phone]

## 2015-02-25 NOTE — Progress Notes (Signed)
PT Cancellation Note  Patient Details Name: MEHLANI BLANKENBURG MRN: [id number] DOB: Aug 31, 1956   Cancelled Treatment:    Reason Eval/Treat Not Completed: Patient at procedure or test/unavailable   [name] Chui,KATHrine E 02/25/2015, 1:54 PM Carmelia Bake, PT, DPT 02/25/2015 Pager: [phone]

## 2015-02-25 NOTE — Progress Notes (Signed)
PARENTERAL NUTRITION CONSULT NOTE - INITIAL  Pharmacy Consult for TPN Indication: Prolonged ileus  Allergies  Allergen Reactions  . Mango Butter Anaphylaxis    Mango  . Levaquin [Levofloxacin] Other (See Comments)    Achilles tendon weakness  . Scallops [Shellfish Allergy] Nausea And Vomiting  . Soy Allergy     Came up on allergy test.   . Tomato Other (See Comments)    Came up on allergy test.    Patient Measurements: Height: 5\' 1"  (154.9 cm) Weight: 187 lb 2.7 oz (84.9 kg) IBW/kg (Calculated) : 47.8 Adjusted Body Weight: 63 kg Usual Weight: 100 kg (40 lb intentional weight loss)  Vital Signs: Temp: 97.9 F (36.6 C) (05/03 0800) Temp Source: Oral (05/03 0800) BP: 112/35 mmHg (05/03 0846) Pulse Rate: 84 (05/03 0846) Intake/Output from previous day: 05/02 0701 - 05/03 0700 In: 2454.4 [I.V.:2304.4; IV Piggyback:150] Out: 7035 [Urine:3190; Emesis/NG output:300; Drains:30] Intake/Output from this shift: Total I/O In: -  Out: 200 [Urine:200]  Labs:  Recent Labs  02/23/15 0405 02/24/15 0400 02/25/15 0808  WBC 13.3* 15.6* 21.2*  HGB 8.1* 8.0* 8.2*  HCT 24.7* 25.0* 24.9*  PLT 367 342 358     Recent Labs  02/24/15 0400  NA 139  K 3.9  CL 106  CO2 28  GLUCOSE 89  BUN 6  CREATININE 0.39*  CALCIUM 8.2*   Estimated Creatinine Clearance: 76.7 mL/min (by C-G formula based on Cr of 0.39).   No results for input(s): GLUCAP in the last 72 hours.  Medical History: Past Medical History  Diagnosis Date  . Diverticulitis of colon   . Obesity   . Hyperlipidemia   . Sleep apnea   . Heart murmur   . Irregular heartbeat   . Thyroid disease     hyperthyroid  . Bursitis   . Ectopic pregnancy     Medications:  Scheduled:  . antiseptic oral rinse  7 mL Mouth Rinse q12n4p  . chlorhexidine  15 mL Mouth Rinse BID  . enoxaparin (LOVENOX) injection  40 mg Subcutaneous Q24H  . morphine   Intravenous 6 times per day  . pantoprazole (PROTONIX) IV  40 mg  Intravenous Q24H  . piperacillin-tazobactam (ZOSYN)  IV  3.375 g Intravenous Q8H  . sodium chloride  500 mL Intravenous Once   Infusions:  . dextrose 5 % and 0.45 % NaCl with KCl 20 mEq/L 100 mL/hr at 02/24/15 1949  . dextrose 5% lactated ringers Stopped (02/23/15 1900)   PRN: diphenhydrAMINE **OR** diphenhydrAMINE, LORazepam, menthol-cetylpyridinium, naloxone **AND** sodium chloride, ondansetron **OR** ondansetron (ZOFRAN) IV, ondansetron (ZOFRAN) IV  Insulin Requirements in the past 24 hours:  None currently ordered  Current Nutrition:  NPO  IVF: D5 1/2NS w/ 20KCl at 100 ml/hr  Central access: PICC to be placed 5/3 TPN start date: 5/3  ASSESSMENT                                                                                                          HPI: 59 year old female with a history of 6-7 bouts of diverticulitis  in the past.  About 2 weeks prior to admission, while on a cruise ship, she noted some lower abdominal discomfort and a ribbon like stools.  She presented to the ED and was noted to have a leukocytosis. CT scan was performed demonstrating findings consistent with acute sigmoid diverticulitis with 2 separate abscesses.  S/p Emergency exploratory laparotomy, drainage of intra-abdominal abscesses, sigmoid colectomy, left salpingo-oophorectomy, descending colostomy. 02/20/15, Dr. Jackolyn Confer.  Significant events:  5/3: Pharmacy is consulted to begin TPN. CT to evaluated for possible A/P abscesses. Attempt NGT claming.  Today:    Glucose - Hx DM2 but not on meds PTA, serum glucoses ok except on 4/29 (209)  Electrolytes - all WNL including corrected Ca on 5/2  Renal - SCr WNL on 5/2, CrCl 77 ml/min  LFTs - slightly elevated 4/26 but < 2x ULN  TGs - Hx HLD but not on meds, will check in AM  Prealbumin - will check in AM  NUTRITIONAL GOALS                                                                                             RD recs: Kcal: 1500-1700,  Protein: 65-75g, Fluid: 1.5L/day Clinimix 5/15 at a goal rate of 60 ml/hr + 20% fat emulsion at 10 ml/hr to provide: 72 g/day protein, 1502 Kcal/day.  Patient states she is not actually allergic to soy, says she eats eggs all the time so there will be no cross reactivity with lipids   PLAN                                                                                                                         At 1800 today:  Start Clinimix 5/15 at 40 ml/hr.  20% fat emulsion at 10 ml/hr   Plan to advance as tolerated to the goal rate.  TPN to contain standard multivitamins and trace elements.  Reduce IVF to 24ml/hr.  Add SSI q8h - sensitive scale.   TPN lab panels on Mondays & Thursdays.  F/u daily.   Peggyann Juba, PharmD, BCPS Pager: [phone] 02/25/2015,9:15 AM

## 2015-02-25 NOTE — Progress Notes (Signed)
Date:  Feb 25, 2015 Update in condition from chart:  Assessment/Plan Perforated sigmoid diverticulitis with intra-abdominal abscesses S/p Emergency exploratory laparotomy, drainage of intra-abdominal abscesses, sigmoid colectomy, left salpingo-oophorectomy, descending colostomy. 02/20/15, Dr. Jackolyn Confer [id number]- Peripherally Inserted Central Catheter/Midline Placement Remains NPO  In: 2454.4 [I.V.:2304.4; IV Piggyback:150] Out: 2426 [STMHD:6222; Emesis/NG output:300; Drains:30] WBC 21.2 (H) K/uL               RBC 2.98 (L) MIL/uL      Hemoglobin 8.2 (L) g/dL      HCT 24.9 (L)      Will continue to follow for Case Management needs.

## 2015-02-25 NOTE — Progress Notes (Signed)
Peripherally Inserted Central Catheter/Midline Placement  The IV Nurse has discussed with the patient and/or persons authorized to consent for the patient, the purpose of this procedure and the potential benefits and risks involved with this procedure.  The benefits include less needle sticks, lab draws from the catheter and patient may be discharged home with the catheter.  Risks include, but not limited to, infection, bleeding, blood clot (thrombus formation), and puncture of an artery; nerve damage and irregular heat beat.  Alternatives to this procedure were also discussed.  PICC/Midline Placement Documentation        Alison Best 02/25/2015, 11:20 AM Consent obtained by Birder Robson, RN

## 2015-02-26 LAB — DIFFERENTIAL
BASOS ABS: 0 10*3/uL (ref 0.0–0.1)
BASOS PCT: 0 % (ref 0–1)
EOS ABS: 0.2 10*3/uL (ref 0.0–0.7)
Eosinophils Relative: 1 % (ref 0–5)
LYMPHS ABS: 2.1 10*3/uL (ref 0.7–4.0)
Lymphocytes Relative: 13 % (ref 12–46)
Monocytes Absolute: 1.3 10*3/uL — ABNORMAL HIGH (ref 0.1–1.0)
Monocytes Relative: 8 % (ref 3–12)
NEUTROS PCT: 78 % — AB (ref 43–77)
Neutro Abs: 12.4 10*3/uL — ABNORMAL HIGH (ref 1.7–7.7)

## 2015-02-26 LAB — MAGNESIUM: MAGNESIUM: 1.4 mg/dL — AB (ref 1.7–2.4)

## 2015-02-26 LAB — COMPREHENSIVE METABOLIC PANEL
ALT: 13 U/L — ABNORMAL LOW (ref 14–54)
ANION GAP: 4 — AB (ref 5–15)
AST: 13 U/L — ABNORMAL LOW (ref 15–41)
Albumin: 1.8 g/dL — ABNORMAL LOW (ref 3.5–5.0)
Alkaline Phosphatase: 68 U/L (ref 38–126)
BUN: 5 mg/dL — AB (ref 6–20)
CALCIUM: 7.8 mg/dL — AB (ref 8.9–10.3)
CO2: 32 mmol/L (ref 22–32)
CREATININE: 0.35 mg/dL — AB (ref 0.44–1.00)
Chloride: 99 mmol/L — ABNORMAL LOW (ref 101–111)
GLUCOSE: 131 mg/dL — AB (ref 70–99)
Potassium: 3.7 mmol/L (ref 3.5–5.1)
Sodium: 135 mmol/L (ref 135–145)
Total Bilirubin: 0.6 mg/dL (ref 0.3–1.2)
Total Protein: 4.5 g/dL — ABNORMAL LOW (ref 6.5–8.1)

## 2015-02-26 LAB — PREALBUMIN: PREALBUMIN: 7 mg/dL — AB (ref 18–38)

## 2015-02-26 LAB — CBC
HCT: 23.1 % — ABNORMAL LOW (ref 36.0–46.0)
Hemoglobin: 7.4 g/dL — ABNORMAL LOW (ref 12.0–15.0)
MCH: 27 pg (ref 26.0–34.0)
MCHC: 32 g/dL (ref 30.0–36.0)
MCV: 84.3 fL (ref 78.0–100.0)
PLATELETS: 302 10*3/uL (ref 150–400)
RBC: 2.74 MIL/uL — ABNORMAL LOW (ref 3.87–5.11)
RDW: 15.2 % (ref 11.5–15.5)
WBC: 16.1 10*3/uL — ABNORMAL HIGH (ref 4.0–10.5)

## 2015-02-26 LAB — HEMOGLOBIN A1C
Hgb A1c MFr Bld: 6.2 % — ABNORMAL HIGH (ref 4.8–5.6)
Mean Plasma Glucose: 131 mg/dL

## 2015-02-26 LAB — GLUCOSE, CAPILLARY
GLUCOSE-CAPILLARY: 138 mg/dL — AB (ref 70–99)
Glucose-Capillary: 123 mg/dL — ABNORMAL HIGH (ref 70–99)
Glucose-Capillary: 114 mg/dL — ABNORMAL HIGH (ref 70–99)
Glucose-Capillary: 112 mg/dL — ABNORMAL HIGH (ref 70–99)

## 2015-02-26 LAB — PHOSPHORUS: Phosphorus: 4.5 mg/dL (ref 2.5–4.6)

## 2015-02-26 LAB — TRIGLYCERIDES: TRIGLYCERIDES: 101 mg/dL (ref <150)

## 2015-02-26 MED ORDER — POTASSIUM CHLORIDE 10 MEQ/50ML IV SOLN
10.0000 meq | INTRAVENOUS | Status: AC
  Administered 2015-02-26 (×2): 10 meq via INTRAVENOUS
  Filled 2015-02-26 (×2): qty 50

## 2015-02-26 MED ORDER — TRACE MINERALS CR-CU-F-FE-I-MN-MO-SE-ZN IV SOLN
INTRAVENOUS | Status: AC
  Administered 2015-02-26: 20:00:00 via INTRAVENOUS
  Filled 2015-02-26: qty 1200

## 2015-02-26 MED ORDER — FAT EMULSION 20 % IV EMUL
240.0000 mL | INTRAVENOUS | Status: AC
  Administered 2015-02-26: 240 mL via INTRAVENOUS
  Filled 2015-02-26: qty 250

## 2015-02-26 MED ORDER — MAGNESIUM SULFATE 2 GM/50ML IV SOLN
2.0000 g | Freq: Once | INTRAVENOUS | Status: AC
  Administered 2015-02-26: 2 g via INTRAVENOUS
  Filled 2015-02-26: qty 50

## 2015-02-26 NOTE — Progress Notes (Signed)
6 Days Post-Op  Subjective: She feels a little better this Am.  TNA is up and going she has some stool coming from her ostomy.  Her wound has not been changed since I did it yesterday.  Drain is clear  Objective: Vital signs in last 24 hours: Temp:  [98 F (36.7 C)-98.8 F (37.1 C)] 98.8 F (37.1 C) (05/04 0400) Pulse Rate:  [84-93] 93 (05/03 1500) Resp:  [14-23] 14 (05/04 0548) BP: (102-131)/(28-92) 111/35 mmHg (05/04 0500) SpO2:  [96 %-100 %] 98 % (05/04 0548) Last BM Date: 02/26/15 610 from NG yesterday 90 from the drain 20 from ostomy Starting clears Starting TNA Afebrile, VSS CMP stable WBC better but still up H/H down more. CT yesterday shows a fluid collection near the IO drain, but nothing more.  Some bilateral effusions.   Intake/Output from previous day: 05/03 0701 - 05/04 0700 In: 2080 [P.O.:80; I.V.:1300; IV Piggyback:100; TPN:600] Out: 3970 [Urine:3250; Emesis/NG output:610; Drains:90; Stool:20] Intake/Output this shift:    General appearance: alert, cooperative and no distress Resp: BS down some in the bases GI: soft, stool in ostomy bag today, the wound has not been changed since yesterday.  Lab Results:   Recent Labs  02/25/15 0808 02/26/15 0600  WBC 21.2* 16.1*  HGB 8.2* 7.4*  HCT 24.9* 23.1*  PLT 358 302    BMET  Recent Labs  02/24/15 0400 02/26/15 0600  NA 139 135  K 3.9 3.7  CL 106 99*  CO2 28 32  GLUCOSE 89 131*  BUN 6 5*  CREATININE 0.39* 0.35*  CALCIUM 8.2* 7.8*   PT/INR  Recent Labs  02/25/15 0928  LABPROT 15.5*  INR 1.22     Recent Labs Lab 02/25/15 0928 02/26/15 0600  AST 14* 13*  ALT 14 13*  ALKPHOS 71 68  BILITOT 1.0 0.6  PROT 5.0* 4.5*  ALBUMIN 2.0* 1.8*     Lipase     Component Value Date/Time   LIPASE 26 02/18/2015 1354     Studies/Results: Ct Abdomen Pelvis W Contrast  02/25/2015   CLINICAL DATA:  Patient with prior exploratory laparotomy and drainage of intra-abdominal abscess with sigmoid  colectomy and left salpingo-oophorectomy.  EXAM: CT ABDOMEN AND PELVIS WITH CONTRAST  TECHNIQUE: Multidetector CT imaging of the abdomen and pelvis was performed using the standard protocol following bolus administration of intravenous contrast.  CONTRAST:  122mL OMNIPAQUE IOHEXOL 300 MG/ML  SOLN  COMPARISON:  CT 02/18/2015  FINDINGS: Lower chest: Small right-greater-than-left pleural effusions with underlying right and left lower lobe consolidation suggestive of atelectasis. Normal heart size. Enteric tube terminates in the stomach.  Hepatobiliary: Liver is normal in size and contour without focal hepatic lesion identified. Gallbladder is unremarkable. No intrahepatic or extrahepatic biliary ductal dilatation.  Pancreas: Unremarkable  Spleen: Unremarkable  Adrenals/Urinary Tract: The adrenal glands are normal. The kidneys enhance symmetrically with contrast. No hydronephrosis. There is mild hyperenhancement of the urothelium of the mid to distal left ureter near the pelvic inflammatory process. Gas within the urinary bladder likely secondary to prior instrumentation.  Stomach/Bowel: Patient status post sigmoid colectomy. Left lower quadrant colostomy is present. No abnormal bowel dilatation to suggest bowel obstruction.  Vascular/Lymphatic: Normal caliber abdominal aorta. No retroperitoneal or pelvic lymphadenopathy. Multiple sub cm pelvic lymph nodes likely reactive in etiology.  Other: Postsurgical changes within the midline abdominal wall compatible with exploratory laparotomy. Pelvic drain is present coursing from the right lower quadrant anterior abdominal wall soft tissues and terminating within the left lower quadrant.  Within the left lower quadrant anterior to the external iliac vessels there is a 3.2 x 3.1 x 2.2 cm fluid and gas containing collection without definable wall (image 65; series 2). Additionally there is mild rim enhancement demonstrated about fluid within the pelvis. Uterus is grossly  unremarkable. Right ovary is grossly unremarkable.  Musculoskeletal: No aggressive or acute appearing osseous lesions. Lower lumbar spine degenerative changes.  IMPRESSION: Fluid and gas collection within the left lower quadrant may represent postoperative/phlegmonous change however early abscess formation is not excluded. The pelvic surgical drain appears to coarse adjacent to this collection. There is an additional small amount of fluid within the pelvis anterior to the uterus and superior to the bladder. Minimal surrounding enhancement raises the possibility of peritonitis.  Small bilateral pleural effusions and underlying pulmonary consolidation, suggestive of atelectasis.  Postsurgical changes within the midline of the abdomen.  Left lower quadrant colostomy.  These results will be called to the ordering clinician or representative by the Radiologist Assistant, and communication documented in the PACS or zVision Dashboard.   Electronically Signed   By: Lovey Newcomer M.D.   On: 02/25/2015 14:55    Medications: . antiseptic oral rinse  7 mL Mouth Rinse q12n4p  . chlorhexidine  15 mL Mouth Rinse BID  . enoxaparin (LOVENOX) injection  40 mg Subcutaneous Q24H  . insulin aspart  0-9 Units Subcutaneous 3 times per day  . morphine   Intravenous 6 times per day  . pantoprazole (PROTONIX) IV  40 mg Intravenous Q24H  . piperacillin-tazobactam (ZOSYN)  IV  3.375 g Intravenous Q8H  . sodium chloride  500 mL Intravenous Once  . sodium chloride  10-40 mL Intracatheter Q12H    Assessment/Plan Perforated sigmoid diverticulitis with intra-abdominal abscesses S/p Emergency exploratory laparotomy, drainage of intra-abdominal abscesses, sigmoid colectomy, left salpingo-oophorectomy, descending colostomy. 02/20/15, Dr. Jackolyn Confer Body mass index is 35.38  Sleep apnea Type II diabetes- not taking meds preop  A1C6.2 Hyperthyroid - not taking meds pre op  TSH 0.016 Hyperlipidemia Anemia  DVT:  Lovenox/SCD Day 9  Zosyn Hx of tobacco use   Plan: Clamp NG tube and if she does well pull it later and start clears, mobilize, dressing changes.  She is pretty stable, check on transferring her to floor later.     Urine culture:   75K CITROBACTER FREUNDII  ENTEROCOCCUS SPECIES        Abscess culture:  No Growth     LOS: 8 days    Amarian Botero 02/26/2015

## 2015-02-26 NOTE — Evaluation (Signed)
Physical Therapy Evaluation Patient Details Name: Alison Best MRN: [id number] DOB: September 02, 1956 Today's Date: 02/26/2015   History of Present Illness  Pt is a 59 year old female s/p Emergency exploratory laparotomy, drainage of intra-abdominal abscesses, sigmoid colectomy, left salpingo-oophorectomy, descending colostomy on 02/20/15  Clinical Impression  Pt admitted with above diagnosis. Pt currently with functional limitations due to the deficits listed below (see PT Problem List).  Pt will benefit from skilled PT to increase their independence and safety with mobility to allow discharge to the venue listed below.   Pt able to tolerate short distance ambulation this morning and will likely progress to home with no PT needs.        Follow Up Recommendations No PT follow up    Equipment Recommendations  Rolling walker with 5" wheels (may progress to no needs)    Recommendations for Other Services       Precautions / Restrictions Precautions Precautions: Fall Precaution Comments: clamped NG tube, multiples lines/leads, R drain      Mobility  Bed Mobility Overal bed mobility: Needs Assistance Bed Mobility: Sidelying to Sit;Rolling Rolling: Min guard Sidelying to sit: Min assist       General bed mobility comments: assist for trunk upright  Transfers Overall transfer level: Needs assistance Equipment used: 1 person hand held assist Transfers: Sit to/from [facility] Sit to Stand: Min assist Stand pivot transfers: Min assist       General transfer comment: verbal cues for safety, assist to steady  Ambulation/Gait Ambulation/Gait assistance: Min guard Ambulation Distance (Feet): 40 Feet Assistive device: 2 person hand held assist Gait Pattern/deviations: Step-through pattern;Decreased stride length Gait velocity: decr   General Gait Details: pt declined RW however required 2 HHA today to steady herself, distance limited due to fatigue  Stairs            Wheelchair Mobility    Modified Rankin (Stroke Patients Only)       Balance                                             Pertinent Vitals/Pain Pain Assessment: 0-10 Pain Score: 4  Pain Location: abdomen Pain Descriptors / Indicators: Sore Pain Intervention(s): Limited activity within patient's tolerance;Monitored during session;PCA encouraged;Repositioned    Home Living Family/patient expects to be discharged to:: Private residence Living Arrangements: Spouse/significant other Available Help at Discharge: Friend(s);Family Type of Home: House Home Access: Stairs to enter   [employer] of Steps: "a couple" Home Layout: One level Home Equipment: None      Prior Function Level of Independence: Independent               Hand Dominance        Extremity/Trunk Assessment               Lower Extremity Assessment: Generalized weakness         Communication   Communication: No difficulties  Cognition Arousal/Alertness: Awake/alert Behavior During Therapy: WFL for tasks assessed/performed Overall Cognitive Status: Within Functional Limits for tasks assessed                      General Comments      Exercises        Assessment/Plan    PT Assessment Patient needs continued PT services  PT Diagnosis Difficulty walking   PT Problem List  Decreased strength;Decreased activity tolerance;Decreased mobility;Decreased knowledge of use of DME  PT Treatment Interventions DME instruction;Gait training;Functional mobility training;Stair training;Patient/family education;Therapeutic activities;Therapeutic exercise   PT Goals (Current goals can be found in the Care Plan section) Acute Rehab PT Goals PT Goal Formulation: With patient Time For Goal Achievement: 03/12/15 Potential to Achieve Goals: Good    Frequency Min 3X/week   Barriers to discharge        Co-evaluation               End of  Session Equipment Utilized During Treatment: Oxygen (on 1L O2 in room (has PCA)) Activity Tolerance: Patient tolerated treatment well Patient left: in chair;with call bell/phone within reach Nurse Communication: Mobility status         Time: 3845-3646 PT Time Calculation (min) (ACUTE ONLY): 15 min   Charges:   PT Evaluation $Initial PT Evaluation Tier I: 1 Procedure     PT G Codes:        Aarion Metzgar,KATHrine E 02/26/2015, 11:19 AM Carmelia Bake, PT, DPT 02/26/2015 Pager: [phone]

## 2015-02-26 NOTE — Consult Note (Signed)
WOC ostomy consult note Stoma type/location:  LLQ colostomy Stomal assessment/size: 1 1/4" with flush stoma to left of midline incision. Peristomal assessment: Intact.  Treatment options for stomal/peristomal skin: One piece Convex system utilized.  Output:  Dark brown soft stool. Ostomy pouching: 1pc.  Education provided:   Patient able to complete lock and roll system.  Educated on push pull method of skin barrier removal.  Gentle cleaning of site demonstrated.  Discussed changing pouch 1 to 2 times weekly and emptying of pouch 3 to 4 times per day or when 1/3 full.  Written materials provided to patient.  Patient unwilling to look at stoma at this time but states that she is "almost there" and is very interested in reviewing literature provided.  Emotional support provided.  [place] team will continue to follow. Domenic Moras RN BSN [place] Pager [phone]

## 2015-02-26 NOTE — Progress Notes (Signed)
NUTRITION FOLLOW UP  Intervention:   - TPN per pharmacy. - Diet advancement as medically tolerated per surgery.  - RD will continue to monitor.  Nutrition Dx:   Inadequate oral intake related to bowel rest as evidenced by NPO; ongoing  Goal:   Pt to meet >/= 90% of their estimated nutrition needs; not met  Monitor:   Weight, labs, TPN initiation, I/O's, diet advancement  Assessment:   59 year old female with a history of 6-7 bouts of diverticulitis in the past. About 2 weeks ago, while on a cruise ship, she noted some lower abdominal discomfort and a ribbon like stools.  4/27: Pt with poor appetite x 10 days and 40 lb intentional weight loss. Pt followed by RDs at [facility].  Pt currently NPO for bowel rest. RD to follow-up once diet advanced and address education needs.  5/4: Pt tolerating TPN.  Diet upgraded to clear liquids. Pt has had apple juice which she tolerated.  Pt with stool output in ostomy.  NGT clamped, if tolerates, will be removed.  Plan per pharmacy At 1800 today:  Increase Clinimix E 5/15 to 50 ml/hr.  20% fat emulsion at 10 ml/hr   Goal rate: Clinimix 5/15 at 60 ml/hr + 20% fat emulsion at 10 ml/hr to provide: 72 g/day protein, 1502 Kcal/day.  Labs and medications reviewed  Height: Ht Readings from Last 1 Encounters:  02/18/15 5' 1"  (1.549 m)    Weight Status:   Wt Readings from Last 1 Encounters:  02/25/15 187 lb 2.7 oz (84.9 kg)    Re-estimated needs:  Kcal: 1500-1700 Protein: 95-105 g Fluid: Per MD  Skin: closed incision on abdomen, closed incision on buttocks  Diet Order: TPN (CLINIMIX-E) Adult Diet clear liquid Room service appropriate?: Yes; Fluid consistency:: Thin .TPN (CLINIMIX-E) Adult   Intake/Output Summary (Last 24 hours) at 02/26/15 1318 Last data filed at 02/26/15 1200  Gross per 24 hour  Intake   2500 ml  Output   3320 ml  Net   -820 ml    Last BM: 5/4-  20 mL colostomy output   Labs:   Recent Labs Lab  02/22/15 0350 02/24/15 0400 02/26/15 0600  NA 142 139 135  K 4.4 3.9 3.7  CL 110 106 99*  CO2 23 28 32  BUN 18 6 5*  CREATININE 0.36* 0.39* 0.35*  CALCIUM 8.3* 8.2* 7.8*  MG  --   --  1.4*  PHOS  --   --  4.5  GLUCOSE 139* 89 131*    CBG (last 3)   Recent Labs  02/25/15 1958 02/26/15 0353 02/26/15 0622  GLUCAP 100* 123* 138*    Scheduled Meds: . antiseptic oral rinse  7 mL Mouth Rinse q12n4p  . chlorhexidine  15 mL Mouth Rinse BID  . enoxaparin (LOVENOX) injection  40 mg Subcutaneous Q24H  . insulin aspart  0-9 Units Subcutaneous 3 times per day  . morphine   Intravenous 6 times per day  . pantoprazole (PROTONIX) IV  40 mg Intravenous Q24H  . piperacillin-tazobactam (ZOSYN)  IV  3.375 g Intravenous Q8H  . sodium chloride  500 mL Intravenous Once  . sodium chloride  10-40 mL Intracatheter Q12H    Continuous Infusions: . Marland KitchenTPN (CLINIMIX-E) Adult     And  . fat emulsion    . dextrose 5 % and 0.45 % NaCl with KCl 20 mEq/L 50 mL/hr at 02/25/15 1244  . Marland KitchenTPN (CLINIMIX-E) Adult 40 mL/hr at 02/25/15 1832   And  .  fat emulsion 240 mL (02/25/15 1833)    Laurette Schimke [place], [place], [place]

## 2015-02-26 NOTE — Progress Notes (Addendum)
PARENTERAL NUTRITION CONSULT NOTE - FOLLOW-UP  Pharmacy Consult for TPN Indication: Prolonged ileus  Allergies  Allergen Reactions  . Mango Butter Anaphylaxis    Mango  . Levaquin [Levofloxacin] Other (See Comments)    Achilles tendon weakness  . Scallops [Shellfish Allergy] Nausea And Vomiting  . Soy Allergy     Came up on allergy test.   . Tomato Other (See Comments)    Came up on allergy test.    Patient Measurements: Height: _0  (154.9 cm) Weight: 187 lb 2.7 oz (84.9 kg) IBW/kg (Calculated) : 47.8 Adjusted Body Weight: 63 kg Usual Weight: 100 kg (40 lb intentional weight loss)  Vital Signs: Temp: 98.9 F (37.2 C) (05/04 0800) Temp Source: Oral (05/04 0800) BP: 111/35 mmHg (05/04 0500) Intake/Output from previous day: 05/03 0701 - 05/04 0700 In: 2080 [P.O.:80; I.V.:1300; IV Piggyback:100; TPN:600] Out: 3970 [Urine:3250; Emesis/NG output:610; Drains:90; Stool:20] Intake/Output from this shift:    Labs:  Recent Labs  02/24/15 0400 02/25/15 0808 02/25/15 0928 02/26/15 0600  WBC 15.6* 21.2*  --  16.1*  HGB 8.0* 8.2*  --  7.4*  HCT 25.0* 24.9*  --  23.1*  PLT 342 358  --  302  INR  --   --  1.22  --      Recent Labs  02/24/15 0400 02/25/15 0928 02/26/15 0600  NA 139  --  135  K 3.9  --  3.7  CL 106  --  99*  CO2 28  --  32  GLUCOSE 89  --  131*  BUN 6  --  5*  CREATININE 0.39*  --  0.35*  CALCIUM 8.2*  --  7.8*  MG  --   --  1.4*  PHOS  --   --  4.5  PROT  --  5.0* 4.5*  ALBUMIN  --  2.0* 1.8*  AST  --  14* 13*  ALT  --  14 13*  ALKPHOS  --  71 68  BILITOT  --  1.0 0.6  BILIDIR  --  0.4  --   IBILI  --  0.6  --   PREALBUMIN  --  10.5* 7.0*  TRIG  --   --  101   Estimated Creatinine Clearance: 76.7 mL/min (by C-G formula based on Cr of 0.35).    Recent Labs  02/25/15 1958 02/26/15 0353 02/26/15 0622  GLUCAP 100* 123* 138*    Medical History: Past Medical History  Diagnosis Date  . Diverticulitis of colon   . Obesity   .  Hyperlipidemia   . Sleep apnea   . Heart murmur   . Irregular heartbeat   . Thyroid disease     hyperthyroid  . Bursitis   . Ectopic pregnancy     Medications:  Scheduled:  . antiseptic oral rinse  7 mL Mouth Rinse q12n4p  . chlorhexidine  15 mL Mouth Rinse BID  . enoxaparin (LOVENOX) injection  40 mg Subcutaneous Q24H  . insulin aspart  0-9 Units Subcutaneous 3 times per day  . magnesium sulfate 1 - 4 g bolus IVPB  2 g Intravenous Once  . morphine   Intravenous 6 times per day  . pantoprazole (PROTONIX) IV  40 mg Intravenous Q24H  . piperacillin-tazobactam (ZOSYN)  IV  3.375 g Intravenous Q8H  . potassium chloride  10 mEq Intravenous Q1 Hr x 2  . sodium chloride  500 mL Intravenous Once  . sodium chloride  10-40 mL Intracatheter Q12H   Infusions:  . Marland Kitchen  TPN (CLINIMIX-E) Adult     And  . fat emulsion    . dextrose 5 % and 0.45 % NaCl with KCl 20 mEq/L 50 mL/hr at 02/25/15 1244  . Marland KitchenTPN (CLINIMIX-E) Adult 40 mL/hr at 02/25/15 1832   And  . fat emulsion 240 mL (02/25/15 1833)   PRN: diphenhydrAMINE **OR** diphenhydrAMINE, LORazepam, menthol-cetylpyridinium, naloxone **AND** sodium chloride, ondansetron **OR** ondansetron (ZOFRAN) IV, ondansetron (ZOFRAN) IV, sodium chloride  Insulin Requirements in the past 24 hours:  1 unit SSI in past 24 hours  Current Nutrition:  Starting clear liquid diet today  IVF: D5 1/2NS w/ 20KCl at 50 ml/hr  Central access: PICC placed 5/3 TPN start date: 5/3  ASSESSMENT                                                                                                          HPI: 59 year old female with a history of 6-7 bouts of diverticulitis in the past.  About 2 weeks prior to admission, while on a cruise ship, she noted some lower abdominal discomfort and a ribbon like stools.  She presented to the ED and was noted to have a leukocytosis. CT scan was performed demonstrating findings consistent with acute sigmoid diverticulitis with 2 separate  abscesses.  S/p Emergency exploratory laparotomy, drainage of intra-abdominal abscesses, sigmoid colectomy, left salpingo-oophorectomy, descending colostomy. 02/20/15, Dr. Jackolyn Confer.  Significant events:  5/3: Pharmacy is consulted to begin TPN. CT to evaluated for possible A/P abscesses. Attempt NGT claming. 5/4: Clamping NG tube today, starting CLD  Today:    Glucose - Hx DM2 but not on meds PTA, CBGs since initiation of TPN at goal < 150  Electrolytes - K at low end of normal at 3.7, Mag low at 1.4, all others WNL including corrected Ca   Renal - SCr WNL, CrCl 77 ml/min  LFTs - AST/ALT slightly low, Alk phos, T bili WNL.   TGs - Hx HLD but not on meds, 101 this AM (WNL)  Prealbumin - 10.5 (5/3), 7 (5/4)  NUTRITIONAL GOALS                                                                                             RD recs: Kcal: 1500-1700, Protein: 65-75g, Fluid: 1.5L/day Clinimix 5/15 at a goal rate of 60 ml/hr + 20% fat emulsion at 10 ml/hr to provide: 72 g/day protein, 1502 Kcal/day.  Patient states she is not actually allergic to soy, says she eats eggs all the time so there will be no cross reactivity with lipids   PLAN  Give Magnesium Sulfate 2g IV x 1  Give KCl 10 mEq IV x 2 runs _________________________________ At 1800 today:  Increase Clinimix E 5/15 to 50 ml/hr.  20% fat emulsion at 10 ml/hr   Plan to advance as tolerated to the goal rate.  TPN to contain standard multivitamins and trace elements.  Continue IVF at 61m/hr.  Continue SSI q8h - sensitive scale.   TPN lab panels on Mondays & Thursdays.  F/u daily.   JLindell Spar PharmD, BCPS Pager: [phone]/01/2015 9:07 AM

## 2015-02-26 NOTE — Progress Notes (Signed)
6 Days Post-Op  Subjective: Pt feeling better mentally.   Objective: Vital signs in last 24 hours: Temp:  [97.9 F (36.6 C)-98.8 F (37.1 C)] 98.8 F (37.1 C) (05/04 0400) Pulse Rate:  [80-93] 93 (05/03 1500) Resp:  [14-25] 14 (05/04 0548) BP: (102-131)/(21-92) 111/35 mmHg (05/04 0500) SpO2:  [96 %-100 %] 98 % (05/04 0548) Last BM Date: 02/18/15  Intake/Output from previous day: 05/03 0701 - 05/04 0700 In: 1380 [P.O.:80; I.V.:1000; IV Piggyback:100; TPN:200] Out: 3970 [Urine:3250; Emesis/NG output:610; Drains:90; Stool:20] Intake/Output this shift:    General appearance: alert and cooperative GI: soft, non-tender; bowel sounds normal; no masses,  no organomegaly and wound c/d/i, ostomy pink/patent  Lab Results:   Recent Labs  02/25/15 0808 02/26/15 0600  WBC 21.2* 16.1*  HGB 8.2* 7.4*  HCT 24.9* 23.1*  PLT 358 302   BMET  Recent Labs  02/24/15 0400 02/26/15 0600  NA 139 135  K 3.9 3.7  CL 106 99*  CO2 28 32  GLUCOSE 89 131*  BUN 6 5*  CREATININE 0.39* 0.35*  CALCIUM 8.2* 7.8*   PT/INR  Recent Labs  02/25/15 0928  LABPROT 15.5*  INR 1.22   ABG No results for input(s): PHART, HCO3 in the last 72 hours.  Invalid input(s): PCO2, PO2  Studies/Results: Ct Abdomen Pelvis W Contrast  02/25/2015   CLINICAL DATA:  Patient with prior exploratory laparotomy and drainage of intra-abdominal abscess with sigmoid colectomy and left salpingo-oophorectomy.  EXAM: CT ABDOMEN AND PELVIS WITH CONTRAST  TECHNIQUE: Multidetector CT imaging of the abdomen and pelvis was performed using the standard protocol following bolus administration of intravenous contrast.  CONTRAST:  160mL OMNIPAQUE IOHEXOL 300 MG/ML  SOLN  COMPARISON:  CT 02/18/2015  FINDINGS: Lower chest: Small right-greater-than-left pleural effusions with underlying right and left lower lobe consolidation suggestive of atelectasis. Normal heart size. Enteric tube terminates in the stomach.  Hepatobiliary: Liver  is normal in size and contour without focal hepatic lesion identified. Gallbladder is unremarkable. No intrahepatic or extrahepatic biliary ductal dilatation.  Pancreas: Unremarkable  Spleen: Unremarkable  Adrenals/Urinary Tract: The adrenal glands are normal. The kidneys enhance symmetrically with contrast. No hydronephrosis. There is mild hyperenhancement of the urothelium of the mid to distal left ureter near the pelvic inflammatory process. Gas within the urinary bladder likely secondary to prior instrumentation.  Stomach/Bowel: Patient status post sigmoid colectomy. Left lower quadrant colostomy is present. No abnormal bowel dilatation to suggest bowel obstruction.  Vascular/Lymphatic: Normal caliber abdominal aorta. No retroperitoneal or pelvic lymphadenopathy. Multiple sub cm pelvic lymph nodes likely reactive in etiology.  Other: Postsurgical changes within the midline abdominal wall compatible with exploratory laparotomy. Pelvic drain is present coursing from the right lower quadrant anterior abdominal wall soft tissues and terminating within the left lower quadrant. Within the left lower quadrant anterior to the external iliac vessels there is a 3.2 x 3.1 x 2.2 cm fluid and gas containing collection without definable wall (image 65; series 2). Additionally there is mild rim enhancement demonstrated about fluid within the pelvis. Uterus is grossly unremarkable. Right ovary is grossly unremarkable.  Musculoskeletal: No aggressive or acute appearing osseous lesions. Lower lumbar spine degenerative changes.  IMPRESSION: Fluid and gas collection within the left lower quadrant may represent postoperative/phlegmonous change however early abscess formation is not excluded. The pelvic surgical drain appears to coarse adjacent to this collection. There is an additional small amount of fluid within the pelvis anterior to the uterus and superior to the bladder. Minimal surrounding enhancement  raises the possibility of  peritonitis.  Small bilateral pleural effusions and underlying pulmonary consolidation, suggestive of atelectasis.  Postsurgical changes within the midline of the abdomen.  Left lower quadrant colostomy.  These results will be called to the ordering clinician or representative by the Radiologist Assistant, and communication documented in the PACS or zVision Dashboard.   Electronically Signed   By: Lovey Newcomer M.D.   On: 02/25/2015 14:55    Anti-infectives: Anti-infectives    Start     Dose/Rate Route Frequency Ordered Stop   02/20/15 2000  piperacillin-tazobactam (ZOSYN) IVPB 3.375 g     3.375 g 12.5 mL/hr over 240 Minutes Intravenous Every 8 hours 02/20/15 1620     02/18/15 1715  piperacillin-tazobactam (ZOSYN) IVPB 3.375 g  Status:  Discontinued     3.375 g 12.5 mL/hr over 240 Minutes Intravenous 3 times per day 02/18/15 1706 02/20/15 1620   02/18/15 1615  Ampicillin-Sulbactam (UNASYN) 3 g in sodium chloride 0.9 % 100 mL IVPB  Status:  Discontinued     3 g 100 mL/hr over 60 Minutes Intravenous  Once 02/18/15 1604 02/18/15 1624      Assessment/Plan: s/p Procedure(s): Emergency EXPLORATORY and drainiage of interabdominal abcesses (N/A) SALPINGO OOPHORECTOMY (Left) COLOSTOMY (N/A) Sigmoid Colectomy CYSTOSCOPY (N/A) CLamp NGT  Start CLD OK to mobilize   LOS: 8 days    Rosario Jacks., Anne Hahn 02/26/2015

## 2015-02-27 LAB — COMPREHENSIVE METABOLIC PANEL
ALK PHOS: 62 U/L (ref 38–126)
ALT: 11 U/L — AB (ref 14–54)
ANION GAP: 4 — AB (ref 5–15)
AST: 14 U/L — ABNORMAL LOW (ref 15–41)
Albumin: 1.7 g/dL — ABNORMAL LOW (ref 3.5–5.0)
BILIRUBIN TOTAL: 0.5 mg/dL (ref 0.3–1.2)
BUN: 10 mg/dL (ref 6–20)
CALCIUM: 7.9 mg/dL — AB (ref 8.9–10.3)
CO2: 31 mmol/L (ref 22–32)
Chloride: 100 mmol/L — ABNORMAL LOW (ref 101–111)
Creatinine, Ser: 0.33 mg/dL — ABNORMAL LOW (ref 0.44–1.00)
GFR calc non Af Amer: >60 mL/min (ref >60)
Glucose, Bld: 114 mg/dL — ABNORMAL HIGH (ref 70–99)
POTASSIUM: 3.9 mmol/L (ref 3.5–5.1)
SODIUM: 135 mmol/L (ref 135–145)
Total Protein: 4.5 g/dL — ABNORMAL LOW (ref 6.5–8.1)

## 2015-02-27 LAB — GLUCOSE, CAPILLARY
Glucose-Capillary: 114 mg/dL — ABNORMAL HIGH (ref 70–99)
Glucose-Capillary: 104 mg/dL — ABNORMAL HIGH (ref 70–99)
Glucose-Capillary: 111 mg/dL — ABNORMAL HIGH (ref 70–99)

## 2015-02-27 LAB — PHOSPHORUS: Phosphorus: 4.2 mg/dL (ref 2.5–4.6)

## 2015-02-27 LAB — MAGNESIUM: Magnesium: 1.8 mg/dL (ref 1.7–2.4)

## 2015-02-27 MED ORDER — KCL IN DEXTROSE-NACL 20-5-0.45 MEQ/L-%-% IV SOLN
INTRAVENOUS | Status: AC
  Administered 2015-02-27: 35 mL/h via INTRAVENOUS

## 2015-02-27 MED ORDER — OXYCODONE-ACETAMINOPHEN 5-325 MG PO TABS
1.0000 | ORAL_TABLET | ORAL | Status: DC | PRN
  Administered 2015-02-27 (×3): 1 via ORAL
  Filled 2015-02-27: qty 2
  Filled 2015-02-27 (×3): qty 1

## 2015-02-27 MED ORDER — KETOROLAC TROMETHAMINE 30 MG/ML IJ SOLN
30.0000 mg | Freq: Four times a day (QID) | INTRAMUSCULAR | Status: AC | PRN
  Administered 2015-02-28 – 2015-03-01 (×5): 30 mg via INTRAVENOUS
  Filled 2015-02-27 (×6): qty 1

## 2015-02-27 MED ORDER — MORPHINE SULFATE 2 MG/ML IJ SOLN
1.0000 mg | INTRAMUSCULAR | Status: DC | PRN
  Administered 2015-03-01 – 2015-03-06 (×22): 2 mg via INTRAVENOUS
  Filled 2015-02-27 (×22): qty 1

## 2015-02-27 MED ORDER — TRACE MINERALS CR-CU-F-FE-I-MN-MO-SE-ZN IV SOLN
INTRAVENOUS | Status: AC
  Administered 2015-02-27: 18:00:00 via INTRAVENOUS
  Filled 2015-02-27: qty 1560

## 2015-02-27 MED ORDER — MAGNESIUM SULFATE IN D5W 10-5 MG/ML-% IV SOLN
1.0000 g | Freq: Once | INTRAVENOUS | Status: AC
  Administered 2015-02-27: 1 g via INTRAVENOUS
  Filled 2015-02-27: qty 100

## 2015-02-27 MED ORDER — ALBUMIN HUMAN 5 % IV SOLN
25.0000 g | INTRAVENOUS | Status: AC
  Administered 2015-02-27 (×2): 25 g via INTRAVENOUS
  Filled 2015-02-27 (×2): qty 500

## 2015-02-27 MED ORDER — TRACE MINERALS CR-CU-F-FE-I-MN-MO-SE-ZN IV SOLN
INTRAVENOUS | Status: DC
  Filled 2015-02-27: qty 1440

## 2015-02-27 MED ORDER — ALBUMIN HUMAN 5 % IV SOLN
25.0000 g | Freq: Once | INTRAVENOUS | Status: AC
  Administered 2015-02-27: 25 g via INTRAVENOUS
  Filled 2015-02-27: qty 500

## 2015-02-27 MED ORDER — FAT EMULSION 20 % IV EMUL
240.0000 mL | INTRAVENOUS | Status: DC
  Filled 2015-02-27: qty 250

## 2015-02-27 NOTE — Progress Notes (Addendum)
PARENTERAL NUTRITION CONSULT NOTE - FOLLOW-UP  Pharmacy Consult for TPN Indication: Prolonged ileus  Allergies  Allergen Reactions  . Mango Butter Anaphylaxis    Mango  . Levaquin [Levofloxacin] Other (See Comments)    Achilles tendon weakness  . Scallops [Shellfish Allergy] Nausea And Vomiting  . Soy Allergy     Came up on allergy test.   . Tomato Other (See Comments)    Came up on allergy test.    Patient Measurements: Height: _0  (154.9 cm) Weight: 187 lb 2.7 oz (84.9 kg) IBW/kg (Calculated) : 47.8 Adjusted Body Weight: 63 kg Usual Weight: 100 kg (40 lb intentional weight loss)  Vital Signs: Temp: 98 F (36.7 C) (05/05 0400) Temp Source: Oral (05/05 0400) BP: 89/39 mmHg (05/05 0400) Intake/Output from previous day: 05/04 0701 - 05/05 0700 In: 2960.2 [P.O.:240; I.V.:1150; IV Piggyback:300; TPN:1270.2] Out: 2540 [Urine:2500; Drains:40] Intake/Output from this shift:    Labs:  Recent Labs  02/25/15 0808 02/25/15 0928 02/26/15 0600  WBC 21.2*  --  16.1*  HGB 8.2*  --  7.4*  HCT 24.9*  --  23.1*  PLT 358  --  302  INR  --  1.22  --      Recent Labs  02/25/15 0928 02/26/15 0600 02/27/15 0530  NA  --  135 135  K  --  3.7 3.9  CL  --  99* 100*  CO2  --  32 31  GLUCOSE  --  131* 114*  BUN  --  5* 10  CREATININE  --  0.35* 0.33*  CALCIUM  --  7.8* 7.9*  MG  --  1.4* 1.8  PHOS  --  4.5 4.2  PROT 5.0* 4.5* 4.5*  ALBUMIN 2.0* 1.8* 1.7*  AST 14* 13* 14*  ALT 14 13* 11*  ALKPHOS 71 68 62  BILITOT 1.0 0.6 0.5  BILIDIR 0.4  --   --   IBILI 0.6  --   --   PREALBUMIN 10.5* 7.0*  --   TRIG  --  101  --    Estimated Creatinine Clearance: 76.7 mL/min (by C-G formula based on Cr of 0.33).    Recent Labs  02/26/15 1439 02/26/15 2145 02/27/15 0530  GLUCAP 114* 112* 114*    Medical History: Past Medical History  Diagnosis Date  . Diverticulitis of colon   . Obesity   . Hyperlipidemia   . Sleep apnea   . Heart murmur   . Irregular heartbeat    . Thyroid disease     hyperthyroid  . Bursitis   . Ectopic pregnancy     Medications:  Scheduled:  . antiseptic oral rinse  7 mL Mouth Rinse q12n4p  . chlorhexidine  15 mL Mouth Rinse BID  . enoxaparin (LOVENOX) injection  40 mg Subcutaneous Q24H  . insulin aspart  0-9 Units Subcutaneous 3 times per day  . magnesium sulfate 1 - 4 g bolus IVPB  1 g Intravenous Once  . morphine   Intravenous 6 times per day  . pantoprazole (PROTONIX) IV  40 mg Intravenous Q24H  . piperacillin-tazobactam (ZOSYN)  IV  3.375 g Intravenous Q8H  . sodium chloride  500 mL Intravenous Once  . sodium chloride  10-40 mL Intracatheter Q12H   Infusions:  . Marland KitchenTPN (CLINIMIX-E) Adult 50 mL/hr at 02/26/15 1944   And  . fat emulsion 240 mL (02/26/15 1945)  . dextrose 5 % and 0.45 % NaCl with KCl 20 mEq/L 1,000 mL (02/27/15 0215)  PRN: diphenhydrAMINE **OR** diphenhydrAMINE, LORazepam, menthol-cetylpyridinium, naloxone **AND** sodium chloride, ondansetron **OR** ondansetron (ZOFRAN) IV, ondansetron (ZOFRAN) IV, sodium chloride  Insulin Requirements in the past 24 hours:  0 units SSI in past 24 hours  Current Nutrition:  Advancing to full liquid diet today  IVF: D5 1/2NS w/ 20KCl at 50 ml/hr  Central access: PICC placed 5/3 TPN start date: 5/3  ASSESSMENT                                                                                                          HPI: 59 year old female with a history of 6-7 bouts of diverticulitis in the past.  About 2 weeks prior to admission, while on a cruise ship, she noted some lower abdominal discomfort and a ribbon like stools.  She presented to the ED and was noted to have a leukocytosis. CT scan was performed demonstrating findings consistent with acute sigmoid diverticulitis with 2 separate abscesses.  S/p Emergency exploratory laparotomy, drainage of intra-abdominal abscesses, sigmoid colectomy, left salpingo-oophorectomy, descending colostomy. 02/20/15, Dr. Jackolyn Confer.  Significant events:  5/3: Pharmacy is consulted to begin TPN. CT to evaluated for possible A/P abscesses. Attempt NGT claming. 5/4: Clamping NG tube, starting CLD 5/5: Advancing to full liquid diet, adding albumin x 2 doses   Today:    Glucose - Hx DM2 but not on meds PTA, CBGs since initiation of TPN at goal < 150  Electrolytes - Mag slightly low at 1.8, all others WNL including corrected Ca   Renal - SCr WNL, CrCl 77 ml/min  LFTs - AST/ALT slightly low, Alk phos, T bili WNL.   TGs - Hx HLD but not on meds, 101 (5/4)  Prealbumin - 10.5 (5/3), 7 (5/4)  NUTRITIONAL GOALS                                                                                             RD recs (re-estimated needs as of 5/4): Kcal: 1500-1700, Protein: 95-105g, Fluid: per MD Clinimix 5/15 at a goal rate of 80 ml/hr + 20% fat emulsion at 10 ml/hr on M/W/F to provide: 96 g/day protein, 1569 Kcal/day.  Patient states she is not actually allergic to soy, says she eats eggs all the time so there will be no cross reactivity with lipids   PLAN  Give Magnesium Sulfate 1g IV x 1 _________________________________ At 1800 today:  Increase Clinimix E 5/15 to of 65 ml/hr.  No fat emulsion today.  Plan to advance as tolerated to the goal rate.  TPN to contain standard multivitamins and trace elements.  Decrease IVF to 35 ml/hr.  Continue SSI q8h - sensitive scale.   TPN lab panels on Mondays & Thursdays.  BMET in AM.  F/u toleration of diet.  F/u clinical course daily.   Lindell Spar, PharmD, BCPS Pager: [phone] 02/27/2015 7:29 AM

## 2015-02-27 NOTE — Progress Notes (Signed)
Dr Lucia Gaskins called regarding orthostatic vital signs.  BP lying 113/20 (49), sitting 109/27 (52), and standing 93/26(46).  Pt also having chills and says she cannot get warm, temp 98.7.  Transfer to floor cancelled at this time.

## 2015-02-27 NOTE — Progress Notes (Signed)
7 Days Post-Op  Subjective: She is doing OK, binder didn't work well for her.  She did well with clears and has stool in her ostomy.  + BS, wound looks better.  BP down some  Objective: Vital signs in last 24 hours: Temp:  [98 F (36.7 C)-98.9 F (37.2 C)] 98 F (36.7 C) (05/05 0400) Resp:  [13-24] 24 (05/05 0600) BP: (89-124)/(20-70) 89/39 mmHg (05/05 0400) SpO2:  [90 %-100 %] 90 % (05/05 0600) Last BM Date: 02/26/15 240 Po 40 from the drain 9 liter positive on fluid balance since admit BP down in the 89 range since 2300 last pm.  Hr 70's-80's afebrile, VSS CMP OK Intake/Output from previous day: 05/04 0701 - 05/05 0700 In: 2960.2 [P.O.:240; I.V.:1150; IV Piggyback:300; TPN:1270.2] Out: 3244 [Urine:2500; Drains:40] Intake/Output this shift:    General appearance: alert, cooperative and no distress Resp: clear to auscultation bilaterally and BS down some in bases. GI: large abdomen, + BS, Ostomy is working.  Open wound is cleanner still a little soupy stuff at the very bottom of the wound..  Lab Results:   Recent Labs  02/25/15 0808 02/26/15 0600  WBC 21.2* 16.1*  HGB 8.2* 7.4*  HCT 24.9* 23.1*  PLT 358 302    BMET  Recent Labs  02/26/15 0600 02/27/15 0530  NA 135 135  K 3.7 3.9  CL 99* 100*  CO2 32 31  GLUCOSE 131* 114*  BUN 5* 10  CREATININE 0.35* 0.33*  CALCIUM 7.8* 7.9*   PT/INR  Recent Labs  02/25/15 0928  LABPROT 15.5*  INR 1.22     Recent Labs Lab 02/25/15 0928 02/26/15 0600 02/27/15 0530  AST 14* 13* 14*  ALT 14 13* 11*  ALKPHOS 71 68 62  BILITOT 1.0 0.6 0.5  PROT 5.0* 4.5* 4.5*  ALBUMIN 2.0* 1.8* 1.7*     Lipase     Component Value Date/Time   LIPASE 26 02/18/2015 1354     Studies/Results: Ct Abdomen Pelvis W Contrast  02/25/2015   CLINICAL DATA:  Patient with prior exploratory laparotomy and drainage of intra-abdominal abscess with sigmoid colectomy and left salpingo-oophorectomy.  EXAM: CT ABDOMEN AND PELVIS WITH  CONTRAST  TECHNIQUE: Multidetector CT imaging of the abdomen and pelvis was performed using the standard protocol following bolus administration of intravenous contrast.  CONTRAST:  110mL OMNIPAQUE IOHEXOL 300 MG/ML  SOLN  COMPARISON:  CT 02/18/2015  FINDINGS: Lower chest: Small right-greater-than-left pleural effusions with underlying right and left lower lobe consolidation suggestive of atelectasis. Normal heart size. Enteric tube terminates in the stomach.  Hepatobiliary: Liver is normal in size and contour without focal hepatic lesion identified. Gallbladder is unremarkable. No intrahepatic or extrahepatic biliary ductal dilatation.  Pancreas: Unremarkable  Spleen: Unremarkable  Adrenals/Urinary Tract: The adrenal glands are normal. The kidneys enhance symmetrically with contrast. No hydronephrosis. There is mild hyperenhancement of the urothelium of the mid to distal left ureter near the pelvic inflammatory process. Gas within the urinary bladder likely secondary to prior instrumentation.  Stomach/Bowel: Patient status post sigmoid colectomy. Left lower quadrant colostomy is present. No abnormal bowel dilatation to suggest bowel obstruction.  Vascular/Lymphatic: Normal caliber abdominal aorta. No retroperitoneal or pelvic lymphadenopathy. Multiple sub cm pelvic lymph nodes likely reactive in etiology.  Other: Postsurgical changes within the midline abdominal wall compatible with exploratory laparotomy. Pelvic drain is present coursing from the right lower quadrant anterior abdominal wall soft tissues and terminating within the left lower quadrant. Within the left lower quadrant anterior to the  external iliac vessels there is a 3.2 x 3.1 x 2.2 cm fluid and gas containing collection without definable wall (image 65; series 2). Additionally there is mild rim enhancement demonstrated about fluid within the pelvis. Uterus is grossly unremarkable. Right ovary is grossly unremarkable.  Musculoskeletal: No aggressive  or acute appearing osseous lesions. Lower lumbar spine degenerative changes.  IMPRESSION: Fluid and gas collection within the left lower quadrant may represent postoperative/phlegmonous change however early abscess formation is not excluded. The pelvic surgical drain appears to coarse adjacent to this collection. There is an additional small amount of fluid within the pelvis anterior to the uterus and superior to the bladder. Minimal surrounding enhancement raises the possibility of peritonitis.  Small bilateral pleural effusions and underlying pulmonary consolidation, suggestive of atelectasis.  Postsurgical changes within the midline of the abdomen.  Left lower quadrant colostomy.  These results will be called to the ordering clinician or representative by the Radiologist Assistant, and communication documented in the PACS or zVision Dashboard.   Electronically Signed   By: Lovey Newcomer M.D.   On: 02/25/2015 14:55    Medications: . antiseptic oral rinse  7 mL Mouth Rinse q12n4p  . chlorhexidine  15 mL Mouth Rinse BID  . enoxaparin (LOVENOX) injection  40 mg Subcutaneous Q24H  . insulin aspart  0-9 Units Subcutaneous 3 times per day  . magnesium sulfate 1 - 4 g bolus IVPB  1 g Intravenous Once  . morphine   Intravenous 6 times per day  . pantoprazole (PROTONIX) IV  40 mg Intravenous Q24H  . piperacillin-tazobactam (ZOSYN)  IV  3.375 g Intravenous Q8H  . sodium chloride  500 mL Intravenous Once  . sodium chloride  10-40 mL Intracatheter Q12H    Assessment/Plan Perforated sigmoid diverticulitis with intra-abdominal abscesses S/p Emergency exploratory laparotomy, drainage of intra-abdominal abscesses, sigmoid colectomy, left salpingo-oophorectomy, descending colostomy. 02/20/15, Dr. Jackolyn Confer Body mass index is 35.38  Sleep apnea Type II diabetes- not taking meds preop A1C6.2 Hyperthyroid - not taking meds pre op TSH 0.016 Hyperlipidemia Anemia  DVT: Lovenox/SCD Day 10 Zosyn Hx of  tobacco use   Plan:  Advance diet to full liquids, albumin see if we can shift some fluid and bring BP up.  D/c PCA today. Start her on some PO pain meds as well as the IV.  If her pressure comes up aim to transfer to floor later today. She has PT and increased activity orders.  If the wound cleans up more we may consider wound vac later.  Day 10 of Zosyn, I will recheck CBC tomorrow and if better consider stopping antibiotics.  She has metformin and tapazole on home med list but has never used it.    LOS: 9 days    Teagen Mcleary 02/27/2015

## 2015-02-28 LAB — CBC
HCT: 20.5 % — ABNORMAL LOW (ref 36.0–46.0)
Hemoglobin: 6.6 g/dL — CL (ref 12.0–15.0)
MCH: 27.3 pg (ref 26.0–34.0)
MCHC: 32.2 g/dL (ref 30.0–36.0)
MCV: 84.7 fL (ref 78.0–100.0)
PLATELETS: 267 10*3/uL (ref 150–400)
RBC: 2.42 MIL/uL — ABNORMAL LOW (ref 3.87–5.11)
RDW: 15.7 % — AB (ref 11.5–15.5)
WBC: 8.7 10*3/uL (ref 4.0–10.5)

## 2015-02-28 LAB — BASIC METABOLIC PANEL
Anion gap: 3 — ABNORMAL LOW (ref 5–15)
BUN: 10 mg/dL (ref 6–20)
CO2: 29 mmol/L (ref 22–32)
Calcium: 8.1 mg/dL — ABNORMAL LOW (ref 8.9–10.3)
Chloride: 107 mmol/L (ref 101–111)
Creatinine, Ser: 0.43 mg/dL — ABNORMAL LOW (ref 0.44–1.00)
GFR calc Af Amer: >60 mL/min (ref >60)
GFR calc non Af Amer: >60 mL/min (ref >60)
GLUCOSE: 117 mg/dL — AB (ref 70–99)
Potassium: 3.6 mmol/L (ref 3.5–5.1)
Sodium: 139 mmol/L (ref 135–145)

## 2015-02-28 LAB — GLUCOSE, CAPILLARY
GLUCOSE-CAPILLARY: 119 mg/dL — AB (ref 70–99)
GLUCOSE-CAPILLARY: 117 mg/dL — AB (ref 70–99)
GLUCOSE-CAPILLARY: 109 mg/dL — AB (ref 70–99)
Glucose-Capillary: 124 mg/dL — ABNORMAL HIGH (ref 70–99)

## 2015-02-28 LAB — PREPARE RBC (CROSSMATCH)

## 2015-02-28 MED ORDER — TRACE MINERALS CR-CU-F-FE-I-MN-MO-SE-ZN IV SOLN
INTRAVENOUS | Status: AC
  Administered 2015-02-28: 18:00:00 via INTRAVENOUS
  Filled 2015-02-28: qty 1920

## 2015-02-28 MED ORDER — SODIUM CHLORIDE 0.9 % IV SOLN
Freq: Once | INTRAVENOUS | Status: AC
  Administered 2015-02-28: 10 mL/h via INTRAVENOUS

## 2015-02-28 MED ORDER — SODIUM CHLORIDE 0.9 % IV SOLN: Freq: Once | INTRAVENOUS | Status: AC

## 2015-02-28 MED ORDER — VITAMINS A & D EX OINT
TOPICAL_OINTMENT | CUTANEOUS | Status: AC
  Filled 2015-02-28: qty 5

## 2015-02-28 MED ORDER — FAT EMULSION 20 % IV EMUL
240.0000 mL | INTRAVENOUS | Status: AC
  Administered 2015-02-28: 240 mL via INTRAVENOUS
  Filled 2015-02-28: qty 250

## 2015-02-28 MED ORDER — KCL IN DEXTROSE-NACL 20-5-0.45 MEQ/L-%-% IV SOLN
INTRAVENOUS | Status: DC
  Administered 2015-03-02: 05:00:00 via INTRAVENOUS
  Filled 2015-02-28 (×2): qty 1000

## 2015-02-28 NOTE — Progress Notes (Signed)
She has 1 unit of blood in,, waiting to find the second unit, there is an antigen issue.  Voiding now on bedside commode, BP is 110 now.  Lungs are clear.

## 2015-02-28 NOTE — Progress Notes (Signed)
PARENTERAL NUTRITION CONSULT NOTE - FOLLOW-UP  Pharmacy Consult for TPN Indication: Prolonged ileus  Allergies  Allergen Reactions  . Mango Butter Anaphylaxis    Mango  . Levaquin [Levofloxacin] Other (See Comments)    Achilles tendon weakness  . Scallops [Shellfish Allergy] Nausea And Vomiting  . Soy Allergy     Came up on allergy test  Pt says she has never had any allergic reactions to soy   . Tomato Other (See Comments)    Came up on allergy test. Has never had any allergic reaction to tomatoes, say she eats tomatoes at home without any problems    Patient Measurements: Height: 5' 1"  (154.9 cm) Weight: 187 lb 2.7 oz (84.9 kg) IBW/kg (Calculated) : 47.8 Adjusted Body Weight: 63 kg Usual Weight: 100 kg (40 lb intentional weight loss)  Vital Signs: Temp: 97.7 F (36.5 C) (05/06 0449) Temp Source: Oral (05/06 0449) BP: 95/35 mmHg (05/06 0500) Intake/Output from previous day: 05/05 0701 - 05/06 0700 In: 4060.6 [P.O.:1425; I.V.:1002.5; IV Piggyback:200; TPN:1433.1] Out: 1825 [Urine:1675; Stool:150] Intake/Output from this shift: Total I/O In: -  Out: 100 [Urine:100]  Labs:  Recent Labs  02/25/15 0808 02/25/15 0928 02/26/15 0600 02/28/15 0457  WBC 21.2*  --  16.1* 8.7  HGB 8.2*  --  7.4* 6.6*  HCT 24.9*  --  23.1* 20.5*  PLT 358  --  302 267  INR  --  1.22  --   --      Recent Labs  02/25/15 0928 02/26/15 0600 02/27/15 0530 02/28/15 0457  NA  --  135 135 139  K  --  3.7 3.9 3.6  CL  --  99* 100* 107  CO2  --  32 31 29  GLUCOSE  --  131* 114* 117*  BUN  --  5* 10 10  CREATININE  --  0.35* 0.33* 0.43*  CALCIUM  --  7.8* 7.9* 8.1*  MG  --  1.4* 1.8  --   PHOS  --  4.5 4.2  --   PROT 5.0* 4.5* 4.5*  --   ALBUMIN 2.0* 1.8* 1.7*  --   AST 14* 13* 14*  --   ALT 14 13* 11*  --   ALKPHOS 71 68 62  --   BILITOT 1.0 0.6 0.5  --   BILIDIR 0.4  --   --   --   IBILI 0.6  --   --   --   PREALBUMIN 10.5* 7.0*  --   --   TRIG  --  101  --   --     Estimated Creatinine Clearance: 76.7 mL/min (by C-G formula based on Cr of 0.43).    Recent Labs  02/27/15 2212 02/28/15 0445 02/28/15 0636  GLUCAP 111* 124* 119*    Medications:  Scheduled:  . sodium chloride   Intravenous Once  . sodium chloride   Intravenous Once  . antiseptic oral rinse  7 mL Mouth Rinse q12n4p  . chlorhexidine  15 mL Mouth Rinse BID  . enoxaparin (LOVENOX) injection  40 mg Subcutaneous Q24H  . insulin aspart  0-9 Units Subcutaneous 3 times per day  . pantoprazole (PROTONIX) IV  40 mg Intravenous Q24H  . piperacillin-tazobactam (ZOSYN)  IV  3.375 g Intravenous Q8H  . sodium chloride  500 mL Intravenous Once  . sodium chloride  10-40 mL Intracatheter Q12H   Infusions:  . Marland KitchenTPN (CLINIMIX-E) Adult 65 mL/hr at 02/27/15 1900  . dextrose 5 % and 0.45 %  NaCl with KCl 20 mEq/L 35 mL/hr (02/27/15 1830)   PRN: ketorolac, LORazepam, menthol-cetylpyridinium, morphine injection, ondansetron **OR** ondansetron (ZOFRAN) IV, oxyCODONE-acetaminophen, sodium chloride  Insulin Requirements in the past 24 hours:  0 units SSI in past 24 hours  Current Nutrition: Full liquids  IVF: D5 1/2NS w/ 20KCl at 16m/hr  Central access: PICC placed 5/3 TPN start date: 5/3  ASSESSMENT                                                                                                          HPI: 59year old female with a history of 6-7 bouts of diverticulitis in the past.  About 2 weeks prior to admission, while on a cruise ship, she noted some lower abdominal discomfort and a ribbon like stools.  She presented to the ED and was noted to have a leukocytosis. CT scan was performed demonstrating findings consistent with acute sigmoid diverticulitis with 2 separate abscesses.  4/28: Emergency exploratory laparotomy, drainage of intra-abdominal abscesses, sigmoid colectomy, left salpingo-oophorectomy, descending colostomy.  Significant events:  5/3: Pharmacy consulted to begin TPN. CT to  evaluated for possible A/P abscesses. Attempt NGT clamping. 5/4: Clamping NG tube, starting CLD 5/5: Advancing to full liquid diet, adding albumin x 2 doses   Today:   Glucose - Hx DM2 but not on meds PTA, CBGs since initiation of TPN at goal < 150  Electrolytes - WNL including CorrCa 9.9. Mag supplemented 5/5.    Renal - SCr WNL, CrCl 77 ml/min  LFTs - AST/ALT slightly low, Alk phos and T bili WNL.   TGs - Hx HLD but not on meds, 101(5/4)  Prealbumin - 10.5(5/3), 7(5/4)  Tolerating small amounts of FL diet.  NUTRITIONAL GOALS                                                                                             RD recs (re-estimated needs as of 5/4): Kcal: 1500-1700, Protein: 95-105g, Fluid: per MD. Clinimix 5/15 at a goal rate of 80 ml/hr + 20% fat emulsion at 10 ml/hr on M/W/F to provide: 96 g/day protein, 1569 Kcal/day. Patient states she is not actually allergic to soy, says she eats eggs all the time so there will be no cross reactivity with lipids  PLAN                                                                    __________________________ At 1800 today:  Increase Clinimix E 5/15 to 80 ml/hr.  20% fat emulsion at 10 ml/hr on M/W/F.  TPN to contain standard multivitamins and trace elements.  Decrease IVF to 35m/hr(same increment as TPN advanced)  Continue SSI q8h - sensitive scale.   TPN lab panels on Mondays & Thursdays.  BMET, mag, and phos in the am.   F/u toleration of diet.  F/u clinical course daily.  TRomeo Rabon PharmD, pager [phone] 02/28/2015,8:16 AM.

## 2015-02-28 NOTE — Progress Notes (Signed)
NUTRITION FOLLOW UP  Intervention:   - TPN per pharmacy. - Provided diet education regarding diet for colostomy. Provided handouts and used teach-back.  - RD will continue to monitor.  Nutrition Dx:   Inadequate oral intake related to bowel rest as evidenced by NPO; ongoing  Goal:   Pt to meet >/= 90% of their estimated nutrition needs; not met  Monitor:   Weight, labs, TPN initiation, I/O's, diet advancement  Assessment:   59 year old female with a history of 6-7 bouts of diverticulitis in the past. About 2 weeks ago, while on a cruise ship, she noted some lower abdominal discomfort and a ribbon like stools.  4/27: Pt with poor appetite x 10 days and 40 lb intentional weight loss. Pt followed by RDs at [facility].  Pt currently NPO for bowel rest. RD to follow-up once diet advanced and address education needs.  5/6: Pt tolerating TPN.  NGT removed.  Diet upgraded to soft diet. Pt eating bites of macaroni and cheese and mashed potatoes.  Per RN, pt with 25 mL stool output in ostomy today.   Pt with questions regarding diet for colostomy. Provided handouts and education.  - Pt tolerating TPN at 60 mL/hr with lipids.   Plan per pharmacy At 1800 today:  Increase Clinimix E 5/15 to 80 ml/hr.  20% fat emulsion at 10 ml/hr on M/W/F.  Goal rate Clinimix 5/15 at a goal rate of 80 ml/hr + 20% fat emulsion at 10 ml/hr on M/W/F to provide: 96 g/day protein, 1569 Kcal/day.  Labs and medications reviewed  Height: Ht Readings from Last 1 Encounters:  02/18/15 _0  (1.549 m)    Weight Status:   Wt Readings from Last 1 Encounters:  02/25/15 187 lb 2.7 oz (84.9 kg)    Re-estimated needs:  Kcal: 1500-1700 Protein: 95-105 g Fluid: Per MD  Skin: closed incision on abdomen, closed incision on buttocks  Diet Order: .TPN (CLINIMIX-E) Adult TPN (CLINIMIX-E) Adult DIET SOFT Room service appropriate?: Yes; Fluid consistency:: Thin   Intake/Output Summary (Last 24 hours) at 02/28/15  1154 Last data filed at 02/28/15 1040  Gross per 24 hour  Intake 3650.58 ml  Output   1775 ml  Net 1875.58 ml    Last BM: 5/6-  25 mL colostomy output   Labs:   Recent Labs Lab 02/26/15 0600 02/27/15 0530 02/28/15 0457  NA 135 135 139  K 3.7 3.9 3.6  CL 99* 100* 107  CO2 32 31 29  BUN 5* 10 10  CREATININE 0.35* 0.33* 0.43*  CALCIUM 7.8* 7.9* 8.1*  MG 1.4* 1.8  --   PHOS 4.5 4.2  --   GLUCOSE 131* 114* 117*    CBG (last 3)   Recent Labs  02/27/15 2212 02/28/15 0445 02/28/15 0636  GLUCAP 111* 124* 119*    Scheduled Meds: . sodium chloride   Intravenous Once  . antiseptic oral rinse  7 mL Mouth Rinse q12n4p  . chlorhexidine  15 mL Mouth Rinse BID  . enoxaparin (LOVENOX) injection  40 mg Subcutaneous Q24H  . insulin aspart  0-9 Units Subcutaneous 3 times per day  . pantoprazole (PROTONIX) IV  40 mg Intravenous Q24H  . piperacillin-tazobactam (ZOSYN)  IV  3.375 g Intravenous Q8H  . sodium chloride  500 mL Intravenous Once  . sodium chloride  10-40 mL Intracatheter Q12H    Continuous Infusions: . Marland KitchenTPN (CLINIMIX-E) Adult 65 mL/hr at 02/27/15 1900  . dextrose 5 % and 0.45 % NaCl with KCl 20  mEq/L 35 mL/hr (02/27/15 1830)  . dextrose 5 % and 0.45 % NaCl with KCl 20 mEq/L    . Marland KitchenTPN (CLINIMIX-E) Adult     And  . fat emulsion      Laurette Schimke MS, [place], LDN [phone]

## 2015-02-28 NOTE — Progress Notes (Signed)
Physical Therapy Treatment Patient Details Name: Alison Best MRN: [id number] DOB: 11-12-1955 Today's Date: 02/28/2015    History of Present Illness Pt is a 59 year old female s/p Emergency exploratory laparotomy, drainage of intra-abdominal abscesses, sigmoid colectomy, left salpingo-oophorectomy, descending colostomy on 02/20/15    PT Comments    Progressing slowly with mobility. Min encouragement for ambulation on today.   Follow Up Recommendations  Supervision - Intermittent     Equipment Recommendations  Rolling walker with 5" wheels (possibly)    Recommendations for Other Services       Precautions / Restrictions Precautions Precautions: Fall Precaution Comments: multiples lines/leads, R drain Restrictions Weight Bearing Restrictions: No    Mobility  Bed Mobility Overal bed mobility: Needs Assistance Bed Mobility: Supine to Sit     Supine to sit: Min assist;HOB elevated     General bed mobility comments: assist for trunk upright  Transfers Overall transfer level: Needs assistance Equipment used: Rolling walker (2 wheeled) Transfers: Sit to/from Stand Sit to Stand: Min guard         General transfer comment: close guard for safety. vcs hand placement  Ambulation/Gait Ambulation/Gait assistance: Min guard Ambulation Distance (Feet): 70 Feet Assistive device: Rolling walker (2 wheeled) Gait Pattern/deviations: Decreased stride length     General Gait Details: pt agreeable to walker use on today. close guard for safety. 1-2 brief standing rest breaks.    Stairs            Wheelchair Mobility    Modified Rankin (Stroke Patients Only)       Balance                                    Cognition Arousal/Alertness: Awake/alert Behavior During Therapy: WFL for tasks assessed/performed Overall Cognitive Status: Within Functional Limits for tasks assessed                      Exercises      General Comments         Pertinent Vitals/Pain Pain Assessment: 0-10 Pain Score: 5  Pain Location: abdomen with activity Pain Descriptors / Indicators: Sore Pain Intervention(s): Limited activity within patient's tolerance;Repositioned    Home Living                      Prior Function            PT Goals (current goals can now be found in the care plan section) Progress towards PT goals: Progressing toward goals    Frequency  Min 3X/week    PT Plan Current plan remains appropriate    Co-evaluation             End of Session   Activity Tolerance: Patient limited by fatigue;Patient limited by pain Patient left: in chair;with call bell/phone within reach     Time: 2423-5361 PT Time Calculation (min) (ACUTE ONLY): 15 min  Charges:  $Gait Training: 8-22 mins                    G Codes:      Weston Anna, MPT Pager: [phone]

## 2015-02-28 NOTE — Progress Notes (Signed)
8 Days Post-Op  Subjective: Doing well. Hgb low  Objective: Vital signs in last 24 hours: Temp:  [97.7 F (36.5 C)-98.7 F (37.1 C)] 97.7 F (36.5 C) (05/06 0449) Resp:  [17-28] 17 (05/06 0500) BP: (92-108)/(20-35) 95/35 mmHg (05/06 0500) SpO2:  [93 %-100 %] 95 % (05/06 0500) Last BM Date: 02/27/15  Intake/Output from previous day: 05/05 0701 - 05/06 0700 In: 4060.6 [P.O.:1425; I.V.:1002.5; IV Piggyback:200; TPN:1433.1] Out: 1825 [Urine:1675; Stool:150] Intake/Output this shift: Total I/O In: -  Out: 100 [Urine:100]  General appearance: alert and cooperative GI: soft, non-tender; bowel sounds normal; no masses,  no organomegaly and ostomy, pink patent, wound c/d/i  Lab Results:   Recent Labs  02/26/15 0600 02/28/15 0457  WBC 16.1* 8.7  HGB 7.4* 6.6*  HCT 23.1* 20.5*  PLT 302 267   BMET  Recent Labs  02/27/15 0530 02/28/15 0457  NA 135 139  K 3.9 3.6  CL 100* 107  CO2 31 29  GLUCOSE 114* 117*  BUN 10 10  CREATININE 0.33* 0.43*  CALCIUM 7.9* 8.1*   PT/INR  Recent Labs  02/25/15 0928  LABPROT 15.5*  INR 1.22   ABG No results for input(s): PHART, HCO3 in the last 72 hours.  Invalid input(s): PCO2, PO2  Studies/Results: No results found.  Anti-infectives: Anti-infectives    Start     Dose/Rate Route Frequency Ordered Stop   02/20/15 2000  piperacillin-tazobactam (ZOSYN) IVPB 3.375 g     3.375 g 12.5 mL/hr over 240 Minutes Intravenous Every 8 hours 02/20/15 1620     02/18/15 1715  piperacillin-tazobactam (ZOSYN) IVPB 3.375 g  Status:  Discontinued     3.375 g 12.5 mL/hr over 240 Minutes Intravenous 3 times per day 02/18/15 1706 02/20/15 1620   02/18/15 1615  Ampicillin-Sulbactam (UNASYN) 3 g in sodium chloride 0.9 % 100 mL IVPB  Status:  Discontinued     3 g 100 mL/hr over 60 Minutes Intravenous  Once 02/18/15 1604 02/18/15 1624      Assessment/Plan: s/p Procedure(s): Emergency EXPLORATORY and drainiage of interabdominal abcesses  (N/A) SALPINGO OOPHORECTOMY (Left) COLOSTOMY (N/A) Sigmoid Colectomy CYSTOSCOPY (N/A) transfuse PRBCs  Adv diet to soft Mobilize Dressing changes  LOS: 10 days    Rosario Jacks., Anne Hahn 02/28/2015

## 2015-02-28 NOTE — Progress Notes (Signed)
CRITICAL VALUE ALERT  Critical value received:  Hgb 6.6  Date of notification:  02/28/15  Time of notification:  0517  Critical value read back:Yes.    Nurse who received alert:  Reche Dixon  MD notified (1st page):  Lucia Gaskins  Time of first page: 0520  MD notified (2nd page):  Time of second page:  Responding MD:  Lucia Gaskins  Time MD responded:  [phone]

## 2015-02-28 NOTE — Consult Note (Addendum)
WOC ostomy consult note Stoma type/location: LLQ colostomy Stomal assessment/size: 1 1/4" with flush stoma to left of midline incision. Current pouch has leaked behind barrier and pt has moisture associated skin damage to 3 cm surrounding stoma from 5:00 o'clock to 7:00 o'clock where a valley is located. Treatment options for stomal/peristomal skin: Discussed crusting technique with stoma powder to protect skin, and use of a belt if desired to hold pouch in place. One piece Convex system utilized with barrier ring to maintain seal.  Output: Mod amt dark brown soft stool. Ostomy pouching: 1pc.  Education provided: Patient able to open and close with lock and roll system. Demonstrated pouch change and she asked appropriate questions, she is still weak and in ICU at this time. Gentle cleaning of site demonstrated. Discussed changing pouch 1 to 2 times weekly and emptying of pouch 3 to 4 times per day or when 1/3 full. Written materials are at bedside, and extra supplies in the room for staff nurse use. [place] team will continue to follow for further educational sessions when stable and out of ICU. Julien Girt MSN, RN, [place], [place], [place]

## 2015-03-01 LAB — GLUCOSE, CAPILLARY
GLUCOSE-CAPILLARY: 106 mg/dL — AB (ref 70–99)
Glucose-Capillary: 122 mg/dL — ABNORMAL HIGH (ref 70–99)
Glucose-Capillary: 118 mg/dL — ABNORMAL HIGH (ref 70–99)
Glucose-Capillary: 109 mg/dL — ABNORMAL HIGH (ref 70–99)

## 2015-03-01 LAB — CBC
HEMATOCRIT: 28 % — AB (ref 36.0–46.0)
HEMOGLOBIN: 9.3 g/dL — AB (ref 12.0–15.0)
MCH: 28.4 pg (ref 26.0–34.0)
MCHC: 33.2 g/dL (ref 30.0–36.0)
MCV: 85.6 fL (ref 78.0–100.0)
Platelets: 311 10*3/uL (ref 150–400)
RBC: 3.27 MIL/uL — AB (ref 3.87–5.11)
RDW: 15.9 % — ABNORMAL HIGH (ref 11.5–15.5)
WBC: 9.5 10*3/uL (ref 4.0–10.5)

## 2015-03-01 LAB — BASIC METABOLIC PANEL
Anion gap: 4 — ABNORMAL LOW (ref 5–15)
BUN: 17 mg/dL (ref 6–20)
CHLORIDE: 109 mmol/L (ref 101–111)
CO2: 26 mmol/L (ref 22–32)
Calcium: 8.4 mg/dL — ABNORMAL LOW (ref 8.9–10.3)
Creatinine, Ser: 0.37 mg/dL — ABNORMAL LOW (ref 0.44–1.00)
GFR calc Af Amer: >60 mL/min (ref >60)
GFR calc non Af Amer: >60 mL/min (ref >60)
Glucose, Bld: 116 mg/dL — ABNORMAL HIGH (ref 70–99)
Potassium: 4 mmol/L (ref 3.5–5.1)
Sodium: 139 mmol/L (ref 135–145)

## 2015-03-01 LAB — MAGNESIUM: MAGNESIUM: 2 mg/dL (ref 1.7–2.4)

## 2015-03-01 LAB — PHOSPHORUS: PHOSPHORUS: 3.7 mg/dL (ref 2.5–4.6)

## 2015-03-01 MED ORDER — INSULIN ASPART 100 UNIT/ML ~~LOC~~ SOLN: 0.0000 [IU] | Freq: Three times a day (TID) | SUBCUTANEOUS | Status: DC

## 2015-03-01 MED ORDER — TRACE MINERALS CR-CU-F-FE-I-MN-MO-SE-ZN IV SOLN
INTRAVENOUS | Status: AC
  Administered 2015-03-01: 19:00:00 via INTRAVENOUS
  Filled 2015-03-01: qty 1440

## 2015-03-01 MED ORDER — INSULIN ASPART 100 UNIT/ML ~~LOC~~ SOLN: 0.0000 [IU] | Freq: Every day | SUBCUTANEOUS | Status: DC

## 2015-03-01 NOTE — Progress Notes (Signed)
Report called to Paula, RN

## 2015-03-01 NOTE — Progress Notes (Signed)
PARENTERAL NUTRITION CONSULT NOTE - FOLLOW-UP  Pharmacy Consult for TPN Indication: Prolonged ileus  Allergies  Allergen Reactions  . Levaquin [Levofloxacin In D5w] Other (See Comments)    Achilles tendon weakness  . Levaquin [Levofloxacin] Other (See Comments)    Achilles tendon weakness    Patient Measurements: Height: 5' 1"  (154.9 cm) Weight: 187 lb 2.7 oz (84.9 kg) IBW/kg (Calculated) : 47.8 Adjusted Body Weight: 63 kg Usual Weight: 100 kg (40 lb intentional weight loss)  Vital Signs: Temp: 98.5 F (36.9 C) (05/07 0653) Temp Source: Oral (05/07 0653) BP: 110/59 mmHg (05/07 0759) Intake/Output from previous day: 05/06 0701 - 05/07 0700 In: 3492.1 [P.O.:240; I.V.:465; Blood:704; IV Piggyback:200; TPN:1883.1] Out: 1625 [Urine:1000; Drains:25; Stool:600] Intake/Output from this shift: Total I/O In: 90 [I.V.:10; TPN:80] Out: -   Labs:  Recent Labs  02/28/15 0457 03/01/15 0700  WBC 8.7 9.5  HGB 6.6* 9.3*  HCT 20.5* 28.0*  PLT 267 311     Recent Labs  02/27/15 0530 02/28/15 0457 03/01/15 0700  NA 135 139 139  K 3.9 3.6 4.0  CL 100* 107 109  CO2 31 29 26   GLUCOSE 114* 117* 116*  BUN 10 10 17   CREATININE 0.33* 0.43* 0.37*  CALCIUM 7.9* 8.1* 8.4*  MG 1.8  --  2.0  PHOS 4.2  --  3.7  PROT 4.5*  --   --   ALBUMIN 1.7*  --   --   AST 14*  --   --   ALT 11*  --   --   ALKPHOS 62  --   --   BILITOT 0.5  --   --    Estimated Creatinine Clearance: 76.7 mL/min (by C-G formula based on Cr of 0.37).    Recent Labs  02/28/15 1622 02/28/15 2200 03/01/15 0607  GLUCAP 117* 109* 122*    Medications:  Scheduled:  . antiseptic oral rinse  7 mL Mouth Rinse q12n4p  . chlorhexidine  15 mL Mouth Rinse BID  . enoxaparin (LOVENOX) injection  40 mg Subcutaneous Q24H  . insulin aspart  0-9 Units Subcutaneous 3 times per day  . pantoprazole (PROTONIX) IV  40 mg Intravenous Q24H  . piperacillin-tazobactam (ZOSYN)  IV  3.375 g Intravenous Q8H  . sodium chloride   500 mL Intravenous Once  . sodium chloride  10-40 mL Intracatheter Q12H   Infusions:  . dextrose 5 % and 0.45 % NaCl with KCl 20 mEq/L 10 mL/hr (02/28/15 1800)  . Marland KitchenTPN (CLINIMIX-E) Adult 80 mL/hr at 02/28/15 1732   And  . fat emulsion 240 mL (02/28/15 1733)   PRN: ketorolac, LORazepam, menthol-cetylpyridinium, morphine injection, ondansetron **OR** ondansetron (ZOFRAN) IV, oxyCODONE-acetaminophen, sodium chloride  Insulin Requirements in the past 24 hours:  1 units SSI in past 24 hours  Current Nutrition: Soft diet  IVF: D5 1/2NS w/ 20KCl at 4m/hr  Central access: PICC placed 5/3 TPN start date: 5/3  ASSESSMENT  HPI: 59 year old female with a history of 6-7 bouts of diverticulitis in the past.  About 2 weeks prior to admission, while on a cruise ship, she noted some lower abdominal discomfort and a ribbon like stools.  She presented to the ED and was noted to have a leukocytosis. CT scan was performed demonstrating findings consistent with acute sigmoid diverticulitis with 2 separate abscesses.  4/28: Emergency exploratory laparotomy, drainage of intra-abdominal abscesses, sigmoid colectomy, left salpingo-oophorectomy, descending colostomy.  Significant events:  5/3: Pharmacy consulted to begin TPN. CT to evaluated for possible A/P abscesses. Attempt NGT clamping. 5/4: Clamping NG tube, starting CLD 5/5: Advancing to full liquid diet, adding albumin x 2 doses   Today:   Glucose - Hx DM2 but not on meds PTA, CBGs on TPN remain at goal < 150  Electrolytes - WNL including CorrCa.     Renal - SCr WNL, CrCl 77 ml/min  LFTs - on 5/5, AST/ALT were slightly low, Alk phos and T bili WNL.   TGs - 101(5/4). Hx HLD but not on meds PTA   Prealbumin - 10.5(5/3), 7(5/4)  Tolerating small amounts of soft diet. Dr. Marcello Moores contacted pharmacy and asked [name] to begin weaning  TPN.  NUTRITIONAL GOALS                                                                                             RD recs (re-estimated needs as of 5/4): Kcal: 1500-1700, Protein: 95-105g, Fluid: per MD. Clinimix 5/15 at a goal rate of 80 ml/hr + 20% fat emulsion at 10 ml/hr on M/W/F to provide: 96 g/day protein, 1569 Kcal/day. Patient states she is not actually allergic to soy, says she eats eggs all the time so there will be no cross reactivity with lipids  PLAN                                                                    __________________________ At 1800 today:  Decrease Clinimix E 5/15 to 58m/hr.  20% fat emulsion at 10 ml/hr on M/W/F only.  TPN to contain standard multivitamins and trace elements.  IVF currently at 141mhr (adjustments per MD)  Change Sensitive SSI to ACHS.   TPN lab panels on Mondays & Thursdays.  BMET in am.   F/u toleration of diet.  F/u clinical course daily.  ToRomeo RabonPharmD, pager [phone] 67805/04/2015,8:32 AM.

## 2015-03-01 NOTE — Progress Notes (Signed)
9 Days Post-Op  Subjective: Doing well.  Not very mobile.  S/p transfusion yesterday.  Hgb responded appropriately   Objective: Vital signs in last 24 hours: Temp:  [97.5 F (36.4 C)-99.4 F (37.4 C)] 98.5 F (36.9 C) (05/07 0653) Resp:  [14-28] 19 (05/07 0600) BP: (97-127)/(30-56) 106/31 mmHg (05/07 0600) SpO2:  [94 %-100 %] 97 % (05/07 0600) Last BM Date: 03/01/15  Intake/Output from previous day: 05/06 0701 - 05/07 0700 In: 3352.1 [P.O.:240; I.V.:455; Blood:704; IV Piggyback:150; TPN:1803.1] Out: 1625 [Urine:1000; Drains:25; Stool:600] Intake/Output this shift:    General appearance: alert and cooperative GI: soft, non-tender; bowel sounds normal; no masses,  no organomegaly and ostomy, pink patent, wound c/d/i  Lab Results:   Recent Labs  02/28/15 0457 03/01/15 0700  WBC 8.7 9.5  HGB 6.6* 9.3*  HCT 20.5* 28.0*  PLT 267 311   BMET  Recent Labs  02/28/15 0457 03/01/15 0700  NA 139 139  K 3.6 4.0  CL 107 109  CO2 29 26  GLUCOSE 117* 116*  BUN 10 17  CREATININE 0.43* 0.37*  CALCIUM 8.1* 8.4*   PT/INR No results for input(s): LABPROT, INR in the last 72 hours. ABG No results for input(s): PHART, HCO3 in the last 72 hours.  Invalid input(s): PCO2, PO2  Studies/Results: No results found.  Anti-infectives: Anti-infectives    Start     Dose/Rate Route Frequency Ordered Stop   02/20/15 2000  piperacillin-tazobactam (ZOSYN) IVPB 3.375 g     3.375 g 12.5 mL/hr over 240 Minutes Intravenous Every 8 hours 02/20/15 1620     02/18/15 1715  piperacillin-tazobactam (ZOSYN) IVPB 3.375 g  Status:  Discontinued     3.375 g 12.5 mL/hr over 240 Minutes Intravenous 3 times per day 02/18/15 1706 02/20/15 1620   02/18/15 1615  Ampicillin-Sulbactam (UNASYN) 3 g in sodium chloride 0.9 % 100 mL IVPB  Status:  Discontinued     3 g 100 mL/hr over 60 Minutes Intravenous  Once 02/18/15 1604 02/18/15 1624      Assessment/Plan: s/p Procedure(s): Emergency EXPLORATORY  and drainiage of interabdominal abcesses (N/A) SALPINGO OOPHORECTOMY (Left) COLOSTOMY (N/A) Sigmoid Colectomy CYSTOSCOPY (N/A) Transfer to floor PT/OT eval Cont soft diet Mobilize Dressing changes  LOS: 11 days    Alison Eve C. 01/23/4238

## 2015-03-02 LAB — BASIC METABOLIC PANEL
ANION GAP: 6 (ref 5–15)
BUN: 19 mg/dL (ref 6–20)
CALCIUM: 8.5 mg/dL — AB (ref 8.9–10.3)
CO2: 27 mmol/L (ref 22–32)
Chloride: 108 mmol/L (ref 101–111)
Creatinine, Ser: 0.37 mg/dL — ABNORMAL LOW (ref 0.44–1.00)
GFR calc Af Amer: >60 mL/min (ref >60)
Glucose, Bld: 101 mg/dL — ABNORMAL HIGH (ref 70–99)
Potassium: 4.2 mmol/L (ref 3.5–5.1)
SODIUM: 141 mmol/L (ref 135–145)

## 2015-03-02 LAB — GLUCOSE, CAPILLARY
GLUCOSE-CAPILLARY: 93 mg/dL (ref 70–99)
Glucose-Capillary: 108 mg/dL — ABNORMAL HIGH (ref 70–99)
Glucose-Capillary: 97 mg/dL (ref 70–99)
Glucose-Capillary: 88 mg/dL (ref 70–99)

## 2015-03-02 MED ORDER — TRACE MINERALS CR-CU-F-FE-I-MN-MO-SE-ZN IV SOLN
INTRAVENOUS | Status: DC
  Administered 2015-03-02: 18:00:00 via INTRAVENOUS
  Filled 2015-03-02: qty 960

## 2015-03-02 NOTE — Progress Notes (Signed)
Received report from day nurse (at 1730).  Pt received pain medicine which was effective.  She said that the day nurse had changed her colostomy bag and she was wondering if the bag applied was the same one that the WOCN had recommended.  She said there were some sample colostomy supplies in her room when she was in ICU and thought one of those had been brought to current room and then applied.  RN checked appliance to ensure no leakage and saw liquid stool trickling out of stoma.  Assured her that WOCN staff would be available to clarify which appliance had been recommended tomorrow.  Gave report to oncoming night nurse who said she will check WOCN documentation for further information.

## 2015-03-02 NOTE — Progress Notes (Signed)
PARENTERAL NUTRITION CONSULT NOTE - FOLLOW-UP  Pharmacy Consult for TPN Indication: Prolonged ileus  Allergies  Allergen Reactions  . Levaquin [Levofloxacin In D5w] Other (See Comments)    Achilles tendon weakness  . Levaquin [Levofloxacin] Other (See Comments)    Achilles tendon weakness    Patient Measurements: Height: 5' 1" (154.9 cm) Weight: 187 lb 2.7 oz (84.9 kg) IBW/kg (Calculated) : 47.8 Adjusted Body Weight: 63 kg Usual Weight: 100 kg (40 lb intentional weight loss)  Vital Signs: Temp: 98.7 F (37.1 C) (05/08 0530) Temp Source: Oral (05/08 0530) BP: 126/51 mmHg (05/08 0530) Pulse Rate: 88 (05/08 0530) Intake/Output from previous day: 05/07 0701 - 05/08 0700 In: 1735 [P.O.:360; I.V.:230; IV Piggyback:150; TPN:990] Out: 2180 [Urine:1800; Drains:5; Stool:375] Intake/Output from this shift:    Labs:  Recent Labs  02/28/15 0457 03/01/15 0700  WBC 8.7 9.5  HGB 6.6* 9.3*  HCT 20.5* 28.0*  PLT 267 311     Recent Labs  02/28/15 0457 03/01/15 0700 03/02/15 0610  NA 139 139 141  K 3.6 4.0 4.2  CL 107 109 108  CO2 _0 GLUCOSE 117* 116* 101*  BUN _1 CREATININE 0.43* 0.37* 0.37*  CALCIUM 8.1* 8.4* 8.5*  MG  --  2.0  --   PHOS  --  3.7  --    Estimated Creatinine Clearance: 76.7 mL/min (by C-G formula based on Cr of 0.37).    Recent Labs  03/01/15 1203 03/01/15 1716 03/01/15 2156  GLUCAP 118* 106* 109*    Medications:  Scheduled:  . antiseptic oral rinse  7 mL Mouth Rinse q12n4p  . chlorhexidine  15 mL Mouth Rinse BID  . enoxaparin (LOVENOX) injection  40 mg Subcutaneous Q24H  . insulin aspart  0-5 Units Subcutaneous QHS  . insulin aspart  0-9 Units Subcutaneous TID WC  . pantoprazole (PROTONIX) IV  40 mg Intravenous Q24H  . piperacillin-tazobactam (ZOSYN)  IV  3.375 g Intravenous Q8H  . sodium chloride  500 mL Intravenous Once  . sodium chloride  10-40 mL Intracatheter Q12H   Infusions:  . Marland KitchenTPN (CLINIMIX-E) Adult 60 mL/hr  at 03/01/15 1832  . dextrose 5 % and 0.45 % NaCl with KCl 20 mEq/L 10 mL/hr at 03/02/15 0441   PRN: ketorolac, LORazepam, menthol-cetylpyridinium, morphine injection, ondansetron **OR** ondansetron (ZOFRAN) IV, oxyCODONE-acetaminophen, sodium chloride  Insulin Requirements in the past 24 hours:  0 units SSI in past 24 hours  Current Nutrition: Regular/soft diet  IVF: D5 1/2NS w/ 20KCl at 8m/hr  Central access: PICC placed 5/3 TPN start date: 5/3  ASSESSMENT                                                                                                          HPI: 59year old female with a history of 6-7 bouts of diverticulitis in the past.  About 2 weeks prior to admission, while on a cruise ship, she noted some lower abdominal discomfort and a ribbon like stools.  She presented to the ED and was noted to have a  leukocytosis. CT scan was performed demonstrating findings consistent with acute sigmoid diverticulitis with 2 separate abscesses.  4/28: Emergency exploratory laparotomy, drainage of intra-abdominal abscesses, sigmoid colectomy, left salpingo-oophorectomy, descending colostomy.  Significant events:  5/3: Pharmacy consulted to begin TPN. CT to evaluated for possible A/P abscesses. Attempt NGT clamping. 5/4: Clamping NG tube, starting CLD 5/5: Advancing to full liquid diet, adding albumin x 2 doses  5/7: Started weaning TPN.  Today:   Glucose - Hx DM2 but not on meds PTA, CBGs on TPN remain at goal < 150  Electrolytes - WNL including CorrCa.     Renal - SCr WNL, CrCl 77 ml/min  LFTs - on 5/5, AST/ALT were slightly low, Alk phos and T bili WNL.   TGs - 101(5/4). Hx HLD but not on meds PTA   Prealbumin - 10.5(5/3), 7(5/4)  Tolerating ~75% of meals.  NUTRITIONAL GOALS                                                                                             RD recs (re-estimated needs as of 5/4): Kcal: 1500-1700, Protein: 95-105g, Fluid: per MD. Clinimix 5/15 at a  goal rate of 80 ml/hr + 20% fat emulsion at 10 ml/hr on M/W/F to provide: 96 g/day protein, 1569 Kcal/day. Patient states she is not actually allergic to soy, says she eats eggs all the time so there will be no cross reactivity with lipids  PLAN                                                                    __________________________ At 1800 today:  Decrease Clinimix E 5/15 to 42m/hr.  20% fat emulsion at 10 ml/hr on M/W/F only.  TPN to contain standard multivitamins and trace elements.  IVF currently at 181mhr (adjustments per MD)  Change Sensitive SSI to ACHS.   TPN lab panels on Mondays & Thursdays.  Will plan to stop TPN after bag completes on 5/9.  F/u clinical course daily.  ToRomeo RabonPharmD, pager [phone]/05/2015,7:43 AM.

## 2015-03-02 NOTE — Progress Notes (Signed)
10 Days Post-Op  Subjective: She is starting to feel better. Still having some soreness. Starting to take a little po  Objective: Vital signs in last 24 hours: Temp:  [97.8 F (36.6 C)-98.7 F (37.1 C)] 98.7 F (37.1 C) (05/08 1025) Pulse Rate:  [51-88] 75 (05/08 1025) Resp:  [15-20] 18 (05/08 1025) BP: (105-126)/(42-53) 113/42 mmHg (05/08 1025) SpO2:  [98 %-100 %] 100 % (05/08 1025) Last BM Date: 03/01/15  Intake/Output from previous day: 05/07 0701 - 05/08 0700 In: 1745 [P.O.:360; I.V.:230; IV Piggyback:150; TPN:990] Out: 2205 [Urine:1800; Drains:10; Stool:395] Intake/Output this shift: Total I/O In: 360 [P.O.:360] Out: 315 [Urine:300; Stool:15]  Resp: clear to auscultation bilaterally Cardio: regular rate and rhythm GI: soft, wound clean. ostomy pink and productive  Lab Results:   Recent Labs  02/28/15 0457 03/01/15 0700  WBC 8.7 9.5  HGB 6.6* 9.3*  HCT 20.5* 28.0*  PLT 267 311   BMET  Recent Labs  03/01/15 0700 03/02/15 0610  NA 139 141  K 4.0 4.2  CL 109 108  CO2 26 27  GLUCOSE 116* 101*  BUN 17 19  CREATININE 0.37* 0.37*  CALCIUM 8.4* 8.5*   PT/INR No results for input(s): LABPROT, INR in the last 72 hours. ABG No results for input(s): PHART, HCO3 in the last 72 hours.  Invalid input(s): PCO2, PO2  Studies/Results: No results found.  Anti-infectives: Anti-infectives    Start     Dose/Rate Route Frequency Ordered Stop   02/20/15 2000  piperacillin-tazobactam (ZOSYN) IVPB 3.375 g     3.375 g 12.5 mL/hr over 240 Minutes Intravenous Every 8 hours 02/20/15 1620     02/18/15 1715  piperacillin-tazobactam (ZOSYN) IVPB 3.375 g  Status:  Discontinued     3.375 g 12.5 mL/hr over 240 Minutes Intravenous 3 times per day 02/18/15 1706 02/20/15 1620   02/18/15 1615  Ampicillin-Sulbactam (UNASYN) 3 g in sodium chloride 0.9 % 100 mL IVPB  Status:  Discontinued     3 g 100 mL/hr over 60 Minutes Intravenous  Once 02/18/15 1604 02/18/15 1624       Assessment/Plan: s/p Procedure(s): Emergency EXPLORATORY and drainiage of interabdominal abcesses (N/A) SALPINGO OOPHORECTOMY (Left) COLOSTOMY (N/A) Sigmoid Colectomy CYSTOSCOPY (N/A) Advance diet. Allow her to take what she can. Will consider stopping tpn soon if she can take more in Continue zosyn ambulate  LOS: 12 days    TOTH III,Siriyah Ambrosius S 03/02/2015

## 2015-03-03 LAB — COMPREHENSIVE METABOLIC PANEL
ALK PHOS: 76 U/L (ref 38–126)
ALT: 12 U/L — ABNORMAL LOW (ref 14–54)
AST: 14 U/L — ABNORMAL LOW (ref 15–41)
Albumin: 2.2 g/dL — ABNORMAL LOW (ref 3.5–5.0)
Anion gap: 5 (ref 5–15)
BILIRUBIN TOTAL: 0.8 mg/dL (ref 0.3–1.2)
BUN: 13 mg/dL (ref 6–20)
CO2: 25 mmol/L (ref 22–32)
Calcium: 8.2 mg/dL — ABNORMAL LOW (ref 8.9–10.3)
Chloride: 105 mmol/L (ref 101–111)
Creatinine, Ser: 0.36 mg/dL — ABNORMAL LOW (ref 0.44–1.00)
GFR calc non Af Amer: >60 mL/min (ref >60)
Glucose, Bld: 100 mg/dL — ABNORMAL HIGH (ref 70–99)
POTASSIUM: 3.9 mmol/L (ref 3.5–5.1)
SODIUM: 135 mmol/L (ref 135–145)
TOTAL PROTEIN: 5.3 g/dL — AB (ref 6.5–8.1)

## 2015-03-03 LAB — DIFFERENTIAL
Basophils Absolute: 0 10*3/uL (ref 0.0–0.1)
Basophils Relative: 0 % (ref 0–1)
EOS ABS: 0.1 10*3/uL (ref 0.0–0.7)
Eosinophils Relative: 1 % (ref 0–5)
LYMPHS ABS: 1.8 10*3/uL (ref 0.7–4.0)
LYMPHS PCT: 15 % (ref 12–46)
MONO ABS: 1.1 10*3/uL — AB (ref 0.1–1.0)
Monocytes Relative: 9 % (ref 3–12)
NEUTROS ABS: 9.1 10*3/uL — AB (ref 1.7–7.7)
Neutrophils Relative %: 75 % (ref 43–77)

## 2015-03-03 LAB — TYPE AND SCREEN
ABO/RH(D): O POS
Antibody Screen: POSITIVE
DAT, IgG: NEGATIVE
DONOR AG TYPE: NEGATIVE
Donor AG Type: NEGATIVE
UNIT DIVISION: 0
Unit division: 0

## 2015-03-03 LAB — CBC
HCT: 26.2 % — ABNORMAL LOW (ref 36.0–46.0)
Hemoglobin: 8.6 g/dL — ABNORMAL LOW (ref 12.0–15.0)
MCH: 28.6 pg (ref 26.0–34.0)
MCHC: 32.8 g/dL (ref 30.0–36.0)
MCV: 87 fL (ref 78.0–100.0)
PLATELETS: 347 10*3/uL (ref 150–400)
RBC: 3.01 MIL/uL — AB (ref 3.87–5.11)
RDW: 17 % — ABNORMAL HIGH (ref 11.5–15.5)
WBC: 12.1 10*3/uL — ABNORMAL HIGH (ref 4.0–10.5)

## 2015-03-03 LAB — GLUCOSE, CAPILLARY
GLUCOSE-CAPILLARY: 91 mg/dL (ref 70–99)
GLUCOSE-CAPILLARY: 99 mg/dL (ref 70–99)
GLUCOSE-CAPILLARY: 91 mg/dL (ref 70–99)
Glucose-Capillary: 82 mg/dL (ref 70–99)

## 2015-03-03 LAB — PHOSPHORUS: PHOSPHORUS: 4.3 mg/dL (ref 2.5–4.6)

## 2015-03-03 LAB — MAGNESIUM: Magnesium: 1.7 mg/dL (ref 1.7–2.4)

## 2015-03-03 LAB — PREALBUMIN: Prealbumin: 8 mg/dL — ABNORMAL LOW (ref 18–38)

## 2015-03-03 LAB — TRIGLYCERIDES: Triglycerides: 111 mg/dL (ref <150)

## 2015-03-03 NOTE — Progress Notes (Signed)
Physical Therapy Treatment Patient Details Name: Alison Best MRN: [id number] DOB: [birth year] Today's Date: 03/03/2015    History of Present Illness Pt is a 59 year old female s/p Emergency exploratory laparotomy, drainage of intra-abdominal abscesses, sigmoid colectomy, left salpingo-oophorectomy, descending colostomy on 02/20/15    PT Comments    Progressing slowly with mobility. Encouraged pt to ambulate more with nursing/family supervision. Left RW in room for pt to use if needed.   Follow Up Recommendations  Supervision - Intermittent     Equipment Recommendations  Rolling walker with 5" wheels    Recommendations for Other Services       Precautions / Restrictions Precautions Precautions: Fall Precaution Comments:  abdominal binder Restrictions Weight Bearing Restrictions: No    Mobility  Bed Mobility Overal bed mobility: Needs Assistance Bed Mobility: Rolling;Sidelying to Sit;Sit to Sidelying Rolling: Min guard Sidelying to sit: Min assist     Sit to sidelying: Min assist General bed mobility comments: assist for trunk to upright. cues for log roll.   Transfers Overall transfer level: Needs assistance   Transfers: Sit to/from Stand Sit to Stand: Min guard         General transfer comment: close guard for safety. Increased time.   Ambulation/Gait Ambulation/Gait assistance: Min guard Ambulation Distance (Feet): 90 Feet Assistive device:  (IV pole)       General Gait Details: close guard for safety. 1-2 standing rest breaks needed. Left RW in room for pt to use when needed. Encouraged pt to walk again later today with nursing/family supervision   Stairs            Wheelchair Mobility    Modified Rankin (Stroke Patients Only)       Balance                                    Cognition Arousal/Alertness: Awake/alert Behavior During Therapy: WFL for tasks assessed/performed Overall Cognitive Status: Within Functional  Limits for tasks assessed                      Exercises      General Comments        Pertinent Vitals/Pain Pain Assessment: 0-10 Pain Score: 6  Pain Location: abdomen Pain Descriptors / Indicators: Sore Pain Intervention(s): Monitored during session;Repositioned    Home Living                      Prior Function            PT Goals (current goals can now be found in the care plan section) Progress towards PT goals: Progressing toward goals    Frequency  Min 3X/week    PT Plan Current plan remains appropriate    Co-evaluation             End of Session   Activity Tolerance: Patient limited by pain;Patient limited by fatigue Patient left: in bed;with call bell/phone within reach     Time: 3532-9924 PT Time Calculation (min) (ACUTE ONLY): 21 min  Charges:  $Gait Training: 8-22 mins                    G Codes:      Weston Anna, MPT Pager: [phone]

## 2015-03-03 NOTE — Progress Notes (Signed)
General Surgery Note  LOS: 13 days  POD -  11 Days Post-Op  Assessment/Plan: 1.  Emergency EXPLORATORY and drainiage of interabdominal abcesses, SALPINGO OOPHORECTOMY, COLOSTOMY, Sigmoid Colectomy, CYSTOSCOPY - 02/20/2015 - T. [facility]bower  Perforated sigmoid diverticulitis  Zosyn - 4/28/>>>  1A.  Wound open - clean  I removed the RLQ drain today  2.  Anemia -   Hgb 8.6 - 03/03/2015  3. Obese 4.  DVT prophylaxis - Lovenox   Active Problems:   Diverticulitis of large intestine with abscess without bleeding   Perforation of sigmoid colon  Subjective:  Doing better, but still weak.  Limited appetite.  Worried about wound and ostomy.  In room by self. Objective:   Filed Vitals:   03/03/15 0450  BP: 107/32  Pulse: 81  Temp: 99.4 F (37.4 C)  Resp: 18     Intake/Output from previous day:  05/08 0701 - 05/09 0700 In: 2433 [P.O.:960; I.V.:190; IV Piggyback:150; TPN:1113] Out: 2670 [Urine:2625; Drains:25; Stool:20]  Intake/Output this shift:      Physical Exam:   General: Obese WF who is alert and oriented.    HEENT: Normal. Pupils equal. .   Lungs: Clear.   Abdomen: Soft.  Obese.   Wound: Clean.  Left sided ostomy okay.  Drain in RLQ - I removed this.   Lab Results:    Recent Labs  03/01/15 0700 03/03/15 0600  WBC 9.5 12.1*  HGB 9.3* 8.6*  HCT 28.0* 26.2*  PLT 311 347    BMET   Recent Labs  03/02/15 0610 03/03/15 0600  NA 141 135  K 4.2 3.9  CL 108 105  CO2 27 25  GLUCOSE 101* 100*  BUN 19 13  CREATININE 0.37* 0.36*  CALCIUM 8.5* 8.2*    PT/INR  No results for input(s): LABPROT, INR in the last 72 hours.  ABG  No results for input(s): PHART, HCO3 in the last 72 hours.  Invalid input(s): PCO2, PO2   Studies/Results:  No results found.   Anti-infectives:   Anti-infectives    Start     Dose/Rate Route Frequency Ordered Stop   02/20/15 2000  piperacillin-tazobactam (ZOSYN) IVPB 3.375 g     3.375 g 12.5 mL/hr over 240 Minutes Intravenous Every  8 hours 02/20/15 1620     02/18/15 1715  piperacillin-tazobactam (ZOSYN) IVPB 3.375 g  Status:  Discontinued     3.375 g 12.5 mL/hr over 240 Minutes Intravenous 3 times per day 02/18/15 1706 02/20/15 1620   02/18/15 1615  Ampicillin-Sulbactam (UNASYN) 3 g in sodium chloride 0.9 % 100 mL IVPB  Status:  Discontinued     3 g 100 mL/hr over 60 Minutes Intravenous  Once 02/18/15 1604 02/18/15 1624      Alphonsa Overall, MD, FACS Pager: [place] [facility] Office: [phone] 03/03/2015

## 2015-03-03 NOTE — Evaluation (Signed)
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 Pt tolerated standing for 2-3 minutes. Tried larger abdominal binder brought to room already and appears too large but pt stating that the small one is too tight. Nursing aware of binder size issue. Will follow on acute to progress ADL independence.     Follow Up Recommendations  No OT follow up;Supervision - Intermittent    Equipment Recommendations  3 in 1 bedside comode    Recommendations for Other Services       Precautions / Restrictions Precautions Precautions: Fall Precaution Comments: multiples lines/leads, R drain; abdominal binder Restrictions Weight Bearing Restrictions: No      Mobility Bed Mobility Overal bed mobility: Needs Assistance   Rolling: Min guard Sidelying to sit: Min assist       General bed mobility comments: assist for trunk to upright. cues for log roll.   Transfers Overall transfer level: Needs assistance Equipment used: None Transfers: Sit to/from Stand Sit to Stand: Min guard Stand pivot transfers: Min guard       General transfer comment: close guard for safety. verbal cues for technique.    Balance                                            ADL Overall ADL's : Needs assistance/impaired Eating/Feeding: Independent;Sitting   Grooming: Wash/dry hands;Set up;Sitting   Upper Body Bathing: Set up;Sitting;Supervision/ safety   Lower Body Bathing: Minimal assistance;Sit to/from stand   Upper Body Dressing : Minimal assistance;Sitting    Lower Body Dressing: Minimal assistance;Sit to/from stand   Toilet Transfer: Stand-pivot;Min guard;BSC   Toileting- Water quality scientist and Hygiene: Min guard;Sit to/from stand         General ADL Comments: Pt states the abdominal binder is too tight and doesnt pull across the front enough. Tried XL abdominal binder in box in room per nursing request and this binder appears too large but pt stating it is more comfortable than the small one. Nursing called and no other size in between the two is available. Pt voided on commode and then transferred into bathroom and stood at the sink for 2-3 minutes to have OT assist with washing her hair. Pt very pleased. Discussed and educated on 3in1 option to help with transferring on and off commode and pt would like one.      Vision     Perception     Praxis      Pertinent Vitals/Pain Pain Assessment: 0-10 Pain Score: 4  Pain Location: abdomen Pain Descriptors / Indicators: Sore Pain Intervention(s): Repositioned     Hand Dominance     Extremity/Trunk Assessment Upper Extremity Assessment Upper Extremity Assessment: Overall WFL for tasks assessed           Communication Communication Communication: No difficulties   Cognition Arousal/Alertness: Awake/alert Behavior During Therapy: WFL for tasks assessed/performed Overall Cognitive Status: Within Functional Limits for tasks assessed  General Comments       Exercises       Shoulder Instructions      Home Living Family/patient expects to be discharged to:: Private residence Living Arrangements: Spouse/significant other Available Help at Discharge: Friend(s);Family Type of Home: House Home Access: Stairs to enter [employer] of Steps: "a couple"   Home Layout: One level         Biochemist, clinical: Standard     Home Equipment: None          Prior Functioning/Environment Level of Independence: Independent              OT Diagnosis: Generalized weakness   OT Problem List: Decreased strength;Decreased knowledge of use of DME or AE   OT Treatment/Interventions: Self-care/ADL training;Patient/family education;Therapeutic activities;DME and/or AE instruction    OT Goals(Current goals can be found in the care plan section) Acute Rehab OT Goals Patient Stated Goal: to continue to move around more. OT Goal Formulation: With patient Time For Goal Achievement: 03/17/15 Potential to Achieve Goals: Good  OT Frequency: Min 2X/week   Barriers to D/C:            Co-evaluation              End of Session    Activity Tolerance: Patient tolerated treatment well Patient left: in chair;with call bell/phone within reach   Time: 0915-0955 OT Time Calculation (min): 40 min Charges:  OT General Charges $OT Visit: 1 Procedure OT Evaluation $Initial OT Evaluation Tier I: 1 Procedure OT Treatments $Self Care/Home Management : 8-22 mins $Therapeutic Activity: 8-22 mins G-Codes:    Jules Schick  [phone] 03/03/2015, 10:40 AM

## 2015-03-03 NOTE — Progress Notes (Signed)
PT Cancellation Note  Patient Details Name: Alison Best MRN: [id number] DOB: Feb 01, 1956   Cancelled Treatment:    Reason Eval/Treat Not Completed: nursing in with pt about to have dressing change. Will check back later today. thanks.    Weston Anna, MPT Pager: [phone]

## 2015-03-03 NOTE — Progress Notes (Signed)
Pt has had no gas and a very small of  amount of liquid stool from ostomy. Previously she was passing gas in ostomy and had more output. Will continue to monitor.

## 2015-03-04 LAB — CBC WITH DIFFERENTIAL/PLATELET
BASOS ABS: 0 10*3/uL (ref 0.0–0.1)
Basophils Relative: 0 % (ref 0–1)
EOS PCT: 1 % (ref 0–5)
Eosinophils Absolute: 0.1 10*3/uL (ref 0.0–0.7)
HEMATOCRIT: 25.6 % — AB (ref 36.0–46.0)
Hemoglobin: 8.5 g/dL — ABNORMAL LOW (ref 12.0–15.0)
LYMPHS ABS: 1.8 10*3/uL (ref 0.7–4.0)
LYMPHS PCT: 14 % (ref 12–46)
MCH: 28.7 pg (ref 26.0–34.0)
MCHC: 33.2 g/dL (ref 30.0–36.0)
MCV: 86.5 fL (ref 78.0–100.0)
MONO ABS: 1.5 10*3/uL — AB (ref 0.1–1.0)
Monocytes Relative: 11 % (ref 3–12)
Neutro Abs: 9.8 10*3/uL — ABNORMAL HIGH (ref 1.7–7.7)
Neutrophils Relative %: 74 % (ref 43–77)
PLATELETS: 365 10*3/uL (ref 150–400)
RBC: 2.96 MIL/uL — ABNORMAL LOW (ref 3.87–5.11)
RDW: 17.2 % — ABNORMAL HIGH (ref 11.5–15.5)
WBC: 13.1 10*3/uL — ABNORMAL HIGH (ref 4.0–10.5)

## 2015-03-04 LAB — GLUCOSE, CAPILLARY
GLUCOSE-CAPILLARY: 93 mg/dL (ref 70–99)
GLUCOSE-CAPILLARY: 107 mg/dL — AB (ref 70–99)
Glucose-Capillary: 89 mg/dL (ref 70–99)
Glucose-Capillary: 97 mg/dL (ref 70–99)

## 2015-03-04 MED ORDER — OXYCODONE HCL 5 MG PO TABS
5.0000 mg | ORAL_TABLET | ORAL | Status: DC | PRN
Start: 2015-03-04 — End: 2015-03-06
  Administered 2015-03-04: 15 mg via ORAL
  Administered 2015-03-04 – 2015-03-06 (×7): 5 mg via ORAL
  Filled 2015-03-04: qty 1
  Filled 2015-03-04: qty 2
  Filled 2015-03-04: qty 1
  Filled 2015-03-04: qty 3
  Filled 2015-03-04 (×4): qty 1

## 2015-03-04 MED ORDER — ACETAMINOPHEN 500 MG PO TABS
1000.0000 mg | ORAL_TABLET | Freq: Three times a day (TID) | ORAL | Status: DC
  Administered 2015-03-04 – 2015-03-06 (×7): 1000 mg via ORAL
  Filled 2015-03-04 (×10): qty 2

## 2015-03-04 NOTE — Progress Notes (Addendum)
CSW met with pt to provide SNF bed offers. Pt will review list and contact CSW with her SNF choice. Pt understands that bed offers are pending BCBS authorization and it can take up to 3 days to receive a decision regarding authorization. Pt is aware that some SNF's will accept pt ( private pay or LOG ) while waiting for authorization if pt is ready for d/c prior to insurance approval. CSW will continue to follow to assist with d/c planning.  Werner Lean LCSW [phone]  [phone] Pt has accepted bed offer from [facility]. SNF will begin authorization process with  BCBS.  Werner Lean LCSW [phone]

## 2015-03-04 NOTE — Consult Note (Addendum)
WOC ostomy consult note CCS following for assessment and plan of care to abd wound. Stoma type/location: LLQ colostomy Stomal assessment/size: Stoma red and viable, 1 1/4" with flush stoma to left of midline incision. Pt previously had moisture associated skin damage surrounding stoma which has greatly improved; only .2cm surrounding stoma is red and macerated. Treatment options for stomal/peristomal skin: Demonstrated crusting technique with stoma powder to protect skin, and discussed use of a belt if desired to hold pouch in place after discharge. One was left at the bedside. One piece Convex system utilized with barrier ring to maintain seal.  Output: Mod amt dark brown soft stool. Ostomy pouching: 1pc.  Education provided: Patient able to open and close with lock and roll system. Demonstrated pouch change and she asked appropriate questions and looked at stoma. Gentle cleaning of site demonstrated. Discussed changing pouch 1 to 2 times weekly and emptying of pouch 3 to 4 times per day or when 1/3 full. Written materials are at bedside, and 2 extra convex pouches and barrier rings in the room for staff nurse use.  Placed on [place] discharge program.   Pt verbalizes understanding and denies further questions at this time.  She feels that she might be transferred to a rehab facility; if she goes home instead then she could benefit from home health assistance. Julien Girt MSN, RN, [place], [place], [place]

## 2015-03-04 NOTE — Clinical Social Work Placement (Signed)
   CLINICAL SOCIAL WORK PLACEMENT  NOTE  Date:  03/04/2015  Patient Details  Name: Alison Best MRN: [id number] Date of Birth: Feb 23, 1956  Clinical Social Work is seeking post-discharge placement for this patient at the [place] level of care (*CSW will initial, date and re-position this form in  chart as items are completed):  Yes   Patient/family provided with [place] Work Department's list of facilities offering this level of care within the geographic area requested by the patient (or if unable, by the patient's family).  Yes   Patient/family informed of their freedom to choose among providers that offer the needed level of care, that participate in Medicare, Medicaid or managed care program needed by the patient, have an available bed and are willing to accept the patient.  Yes   Patient/family informed of [facility]'s ownership interest in [facility] and [facility], as well as of the fact that they are under no obligation to receive care at these facilities.  PASRR submitted to EDS on 03/04/15     PASRR number received on 03/04/15     Existing PASRR number confirmed on       FL2 transmitted to all facilities in geographic area requested by pt/family on       FL2 transmitted to all facilities within larger geographic area on 03/04/15     Patient informed that his/her managed care company has contracts with or will negotiate with certain facilities, including the following:            Patient/family informed of bed offers received.  Patient chooses bed at       Physician recommends and patient chooses bed at      Patient to be transferred to   on  .  Patient to be transferred to facility by       Patient family notified on   of transfer.  Name of family member notified:        PHYSICIAN       Additional Comment:    _______________________________________________ Luretha Rued, [place]   03/04/2015, 11:34 AM

## 2015-03-04 NOTE — Progress Notes (Signed)
NUTRITION FOLLOW UP  Intervention:   -Encourage PO intake (small frequent meals) - RD will continue to monitor.  Nutrition Dx:   Inadequate oral intake related to bowel rest as evidenced by NPO; resolved.  New Nutrition Diagnosis: Food and nutrition related knowledge deficit related to new colostomy as evidenced by pt request for education.  Goal:   Pt to meet >/= 90% of their estimated nutrition needs; not met  Monitor:   Weight, labs, I/O's, po intake  Assessment:   59 year old female with a history of 6-7 bouts of diverticulitis in the past. About 2 weeks ago, while on a cruise ship, she noted some lower abdominal discomfort and a ribbon like stools.  4/27: Pt with poor appetite x 10 days and 40 lb intentional weight loss. Pt followed by RDs at [facility].  Pt currently NPO for bowel rest. RD to follow-up once diet advanced and address education needs.  5/6: Pt tolerating TPN.  NGT removed.  Diet upgraded to soft diet. Pt eating bites of macaroni and cheese and mashed potatoes.  Per RN, pt with 25 mL stool output in ostomy today.   Pt with questions regarding diet for colostomy. Provided handouts and education.  - Pt tolerating TPN at 60 mL/hr with lipids.   5/10: -TPN d/c 5/9 -Diet advanced to regular diet. PO intake: 100% -Encouraged pt to consume small frequent meals since she is unable to tolerate a lot of food at one time. -Pt reports trying to include a protein with every meal  Labs and medications reviewed  Height: Ht Readings from Last 1 Encounters:  02/18/15 5' 1"  (1.549 m)    Weight Status:   Wt Readings from Last 1 Encounters:  02/25/15 187 lb 2.7 oz (84.9 kg)    Re-estimated needs:  Kcal: 1500-1700 Protein: 95-105 g Fluid: Per MD  Skin: closed incision on abdomen, closed incision on buttocks  Diet Order: Diet regular Room service appropriate?: Yes; Fluid consistency:: Thin   Intake/Output Summary (Last 24 hours) at 03/04/15 1128 Last data filed  at 03/04/15 1052  Gross per 24 hour  Intake    600 ml  Output   2500 ml  Net  -1900 ml    Last BM: 5/9-  50 mL colostomy output   Labs:   Recent Labs Lab 02/27/15 0530  03/01/15 0700 03/02/15 0610 03/03/15 0600  NA 135  < > 139 141 135  K 3.9  < > 4.0 4.2 3.9  CL 100*  < > 109 108 105  CO2 31  < > 26 27 25   BUN 10  < > 17 19 13   CREATININE 0.33*  < > 0.37* 0.37* 0.36*  CALCIUM 7.9*  < > 8.4* 8.5* 8.2*  MG 1.8  --  2.0  --  1.7  PHOS 4.2  --  3.7  --  4.3  GLUCOSE 114*  < > 116* 101* 100*  < > = values in this interval not displayed.  CBG (last 3)   Recent Labs  03/03/15 1625 03/03/15 2217 03/04/15 0753  GLUCAP 91 82 93    Scheduled Meds: . acetaminophen  1,000 mg Oral TID  . enoxaparin (LOVENOX) injection  40 mg Subcutaneous Q24H  . insulin aspart  0-5 Units Subcutaneous QHS  . insulin aspart  0-9 Units Subcutaneous TID WC  . pantoprazole (PROTONIX) IV  40 mg Intravenous Q24H  . sodium chloride  500 mL Intravenous Once  . sodium chloride  10-40 mL Intracatheter Q12H  Continuous Infusions: . dextrose 5 % and 0.45 % NaCl with KCl 20 mEq/L 10 mL/hr at 03/02/15 0441    Clayton Bibles, MS, RD, LDN Pager: [phone] After Hours Pager: [phone]

## 2015-03-04 NOTE — Progress Notes (Signed)
Discussion with patient at bedside regarding d/c plans. Patient tearful when discussing ostomy care, apologetic for emotions. Allowed patient to express emotions and concerns. Patient is concerned that husband will not be able to assist with dressing changes and ostomy care. States she does have other family and church members that might be willing to assist. Discussed limitation of [place] services and need for teachable caregiver. Provided patient with list for [facility] choice. Patient also interested in SNF for wound/ostomy care. Contacted CSW to follow up with patient regarding SNF options. Will allow patient to process information and follow up later. Contacted [facility] nurse to arrange another visit with patient.

## 2015-03-04 NOTE — Clinical Social Work Note (Signed)
Clinical Social Work Assessment  Patient Details  Name: Alison Best MRN: [id number] Date of Birth: 1956/07/04  Date of referral:  03/04/15               Reason for consult:  Facility Placement, Discharge Planning                Permission sought to share information with:    Permission granted to share information::     Name::        Agency::     Relationship::     Contact Information:     Housing/Transportation Living arrangements for the past 2 months:  Single Family Home Source of Information:  Patient Patient Interpreter Needed:  None Criminal Activity/Legal Involvement Pertinent to Current Situation/Hospitalization:  No - Comment as needed Significant Relationships:  Spouse Lives with:    Do you feel safe going back to the place where you live?  Yes Need for family participation in patient care:  Yes (Comment)  Care giving concerns:  Pt will require dressing changes 2-3x daily and ostomy care. Pt has concerns with her wound care. She does not feel spouse will be comfortable assisting with care. She isn't sure she has anyone else that can assist.   Social Worker assessment / plan: Pt is a 59 yr old female hospitalized on 4/26 due to abdominal pain. Emergency surgery was required. Pt has a new colostomy and requires wound care / dressing changes 2-3x daily. Pt had been vacationing when pain first started and was treated by ship MD. When pain continued, following her return home, pt came to the hospital. CSW met with pt to assist with d/c planning. PN reviewed and CSW spoke with [facility] prior to visit. Pt is tearful / anxious regarding her medical status and care needs. Pt is  concerned that she won't have the care she needs at home. Pt is willing to consider SNF placement for wound care needs. Neither PT nor OT are recommending follow therapy at d/c. CSW has initiated SNF search. Bed offers are pending. CSW will continue to follow to assist with d/c planning. Support and  reassurance provided during visit. Employment status:  Therapist, music:  Managed Care PT Recommendations:  No Follow Up Information / Referral to community resources:     Patient/Family's Response to care: Pt feels SNF placement may be beneficial.  Patient/Family's Understanding of and Emotional Response to Diagnosis, Current Treatment, and Prognosis:  " All this is so unexpected. " Pt is feeling overwhelmed.     Emotional Assessment Appearance:  Appears stated age, Well-Groomed Attitude/Demeanor/Rapport:  Other (cooperative) Affect (typically observed):  Tearful/Crying, Overwhelmed, Anxious Orientation:  Oriented to Self, Oriented to Place, Oriented to  Time, Oriented to Situation Alcohol / Substance use:  Alcohol Use Psych involvement (Current and /or in the community):  No (Comment)  Discharge Needs  Concerns to be addressed:  Adjustment to Illness, Discharge Planning Concerns Readmission within the last 30 days:  No Current discharge risk:  None Barriers to Discharge:  No Barriers Identified   Khian Remo, Randall An, LCSW 03/04/2015, 11:09 AM

## 2015-03-04 NOTE — Progress Notes (Signed)
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 Pt reporting that both abdominal binders are uncomfortable--one is too big and the other too small. Informed nursing. Will continue to follow to progress ADL independence.     Follow Up Recommendations  No OT follow up;Supervision - Intermittent    Equipment Recommendations  3 in 1 bedside comode    Recommendations for Other Services      Precautions / Restrictions Precautions Precautions: Fall Precaution Comments:  abdominal binder (have tried both binders and pt stating neither fit her well) Restrictions Weight Bearing Restrictions: No       Mobility Bed Mobility Overal bed mobility: Needs Assistance Bed Mobility: Rolling;Sidelying to Sit Rolling: Min guard Sidelying to sit: Min assist       General bed mobility comments: assist for trunk upright. cues for log roll technique.  Transfers Overall transfer level: Needs assistance Equipment used: None   Sit to Stand: Min guard              Balance                                   ADL       Grooming: Oral care;Min guard;Standing                   Toilet Transfer: Min guard;Stand-pivot;BSC   Toileting- Clothing Manipulation and Hygiene: Min guard;Sit to/from stand         General ADL Comments: Pt stating both binders are uncomfortable --one is too big and the other is too tight. Informed nursing and left note for MD. Pt requesting to not wear binder up to [facility] as it is too uncomfortable. Pt still has difficulty with bed mobiltiy and sidlelying to sit. Pt  tearful during part of session and stated, "Its a lot to manage." Provided reassurance. Pt appreciative to be able to get up to the bathroom to brush her teeth.      Vision                     Perception     Praxis      Cognition   Behavior During Therapy: WFL for tasks assessed/performed Overall Cognitive Status: Within Functional Limits for tasks assessed                       Extremity/Trunk Assessment               Exercises     Shoulder Instructions       General Comments      Pertinent Vitals/ Pain       Pain Assessment: 0-10 Pain Score: 4  Pain Location: abdomen Pain Descriptors / Indicators: Sore Pain Intervention(s): Repositioned;Patient requesting pain meds-RN notified  Home Living                                          Prior Functioning/Environment  Frequency Min 2X/week     Progress Toward Goals  OT Goals(current goals can now be found in the care plan section)  Progress towards OT goals: Progressing toward goals     Plan Discharge plan remains appropriate    Co-evaluation                 End of Session     Activity Tolerance Patient tolerated treatment well   Patient Left in chair;with call bell/phone within reach   Nurse Communication          Time: 2952-8413 OT Time Calculation (min): 23 min  Charges: OT General Charges $OT Visit: 1 Procedure OT Treatments $Self Care/Home Management : 8-22 mins $Therapeutic Activity: 8-22 mins  Jules Schick  [phone] 03/04/2015, 10:42 AM

## 2015-03-04 NOTE — Progress Notes (Signed)
Patient ID: Alison Best, female   DOB: [birth year], 59 y.o.   MRN: [id number]     [facility]      [address] [place], [place] [address]    Phone: [phone] FAX: [phone]     Subjective: More pain.  Tearful, but optimistic.  VSS.  Temp T mx 99.4.  WBC up from 12k.  Walking.  Tolerating soft diet.   Objective:  Vital signs:  Filed Vitals:   03/03/15 0450 03/03/15 1410 03/03/15 2145 03/04/15 0551  BP: 107/32 105/51 107/47 108/51  Pulse: 81 94 91 85  Temp: 99.4 F (37.4 C) 98.8 F (37.1 C) 99.3 F (37.4 C) 99.4 F (37.4 C)  TempSrc: Oral Oral Oral Oral  Resp: 18 18 18 16   Height:      Weight:      SpO2: 96%  96% 96%    Last BM Date: 03/03/15  Intake/Output   Yesterday:  05/09 0701 - 05/10 0700 In: 600 [P.O.:360; I.V.:240] Out: 2125 [Urine:2075; Stool:50] This shift: I/O last 3 completed shifts: In: 1425.3 [P.O.:480; I.V.:355.3; Other:10; IV Piggyback:100] Out: 3015 [Urine:2950; Drains:10; Stool:55]    Physical Exam: General: Pt awake/alert/oriented x4 in no acute distress Chest: cta No chest wall pain w good excursion CV:  Pulses intact.  Regular rhythm MS: Normal AROM mjr joints.  No obvious deformity Abdomen: Soft.  Nondistended. Mildly tender at incisions only.  Midline wound is c/d/i.  LLQ ostomy is functioning.  No evidence of peritonitis.  No incarcerated hernias. Ext:  SCDs BLE.  No mjr edema.  No cyanosis Skin: No petechiae / purpura   Problem List:   Active Problems:   Diverticulitis of large intestine with abscess without bleeding   Perforation of sigmoid colon    Results:   Labs: Results for orders placed or performed during the hospital encounter of 02/18/15 (from the past 48 hour(s))  Glucose, capillary     Status: None   Collection Time: 03/02/15 12:20 PM  Result Value Ref Range   Glucose-Capillary 97 70 - 99 mg/dL  Glucose, capillary     Status: None   Collection Time:  03/02/15  5:09 PM  Result Value Ref Range   Glucose-Capillary 88 70 - 99 mg/dL  Glucose, capillary     Status: None   Collection Time: 03/02/15  9:00 PM  Result Value Ref Range   Glucose-Capillary 93 70 - 99 mg/dL   Comment 1 Notify RN   Comprehensive metabolic panel     Status: Abnormal   Collection Time: 03/03/15  6:00 AM  Result Value Ref Range   Sodium 135 135 - 145 mmol/L   Potassium 3.9 3.5 - 5.1 mmol/L   Chloride 105 101 - 111 mmol/L   CO2 25 22 - 32 mmol/L   Glucose, Bld 100 (H) 70 - 99 mg/dL   BUN 13 6 - 20 mg/dL   Creatinine, Ser 0.36 (L) 0.44 - 1.00 mg/dL   Calcium 8.2 (L) 8.9 - 10.3 mg/dL   Total Protein 5.3 (L) 6.5 - 8.1 g/dL   Albumin 2.2 (L) 3.5 - 5.0 g/dL   AST 14 (L) 15 - 41 U/L   ALT 12 (L) 14 - 54 U/L   Alkaline Phosphatase 76 38 - 126 U/L   Total Bilirubin 0.8 0.3 - 1.2 mg/dL   GFR calc non Af Amer >60 >60 mL/min   GFR calc Af Amer >60 >60 mL/min    Comment: (NOTE) The eGFR  has been calculated using the CKD EPI equation. This calculation has not been validated in all clinical situations. eGFR's persistently <60 mL/min signify possible Chronic Kidney Disease.    Anion gap 5 5 - 15  Magnesium     Status: None   Collection Time: 03/03/15  6:00 AM  Result Value Ref Range   Magnesium 1.7 1.7 - 2.4 mg/dL  Phosphorus     Status: None   Collection Time: 03/03/15  6:00 AM  Result Value Ref Range   Phosphorus 4.3 2.5 - 4.6 mg/dL  CBC     Status: Abnormal   Collection Time: 03/03/15  6:00 AM  Result Value Ref Range   WBC 12.1 (H) 4.0 - 10.5 K/uL   RBC 3.01 (L) 3.87 - 5.11 MIL/uL   Hemoglobin 8.6 (L) 12.0 - 15.0 g/dL   HCT 26.2 (L) 36.0 - 46.0 %   MCV 87.0 78.0 - 100.0 fL   MCH 28.6 26.0 - 34.0 pg   MCHC 32.8 30.0 - 36.0 g/dL   RDW 17.0 (H) 11.5 - 15.5 %   Platelets 347 150 - 400 K/uL  Differential     Status: Abnormal   Collection Time: 03/03/15  6:00 AM  Result Value Ref Range   Neutrophils Relative % 75 43 - 77 %   Neutro Abs 9.1 (H) 1.7 - 7.7 K/uL    Lymphocytes Relative 15 12 - 46 %   Lymphs Abs 1.8 0.7 - 4.0 K/uL   Monocytes Relative 9 3 - 12 %   Monocytes Absolute 1.1 (H) 0.1 - 1.0 K/uL   Eosinophils Relative 1 0 - 5 %   Eosinophils Absolute 0.1 0.0 - 0.7 K/uL   Basophils Relative 0 0 - 1 %   Basophils Absolute 0.0 0.0 - 0.1 K/uL  Triglycerides     Status: None   Collection Time: 03/03/15  6:00 AM  Result Value Ref Range   Triglycerides 111 <150 mg/dL    Comment: Performed at [facility]  Prealbumin     Status: Abnormal   Collection Time: 03/03/15  6:00 AM  Result Value Ref Range   Prealbumin 8.0 (L) 18 - 38 mg/dL    Comment: Performed at [facility]  Glucose, capillary     Status: None   Collection Time: 03/03/15  7:44 AM  Result Value Ref Range   Glucose-Capillary 91 70 - 99 mg/dL  Glucose, capillary     Status: None   Collection Time: 03/03/15 12:19 PM  Result Value Ref Range   Glucose-Capillary 99 70 - 99 mg/dL  Glucose, capillary     Status: None   Collection Time: 03/03/15  4:25 PM  Result Value Ref Range   Glucose-Capillary 91 70 - 99 mg/dL  Glucose, capillary     Status: None   Collection Time: 03/03/15 10:17 PM  Result Value Ref Range   Glucose-Capillary 82 70 - 99 mg/dL  CBC with Differential/Platelet     Status: Abnormal   Collection Time: 03/04/15  4:27 AM  Result Value Ref Range   WBC 13.1 (H) 4.0 - 10.5 K/uL   RBC 2.96 (L) 3.87 - 5.11 MIL/uL   Hemoglobin 8.5 (L) 12.0 - 15.0 g/dL   HCT 25.6 (L) 36.0 - 46.0 %   MCV 86.5 78.0 - 100.0 fL   MCH 28.7 26.0 - 34.0 pg   MCHC 33.2 30.0 - 36.0 g/dL   RDW 17.2 (H) 11.5 - 15.5 %   Platelets 365 150 - 400 K/uL  Neutrophils Relative % 74 43 - 77 %   Neutro Abs 9.8 (H) 1.7 - 7.7 K/uL   Lymphocytes Relative 14 12 - 46 %   Lymphs Abs 1.8 0.7 - 4.0 K/uL   Monocytes Relative 11 3 - 12 %   Monocytes Absolute 1.5 (H) 0.1 - 1.0 K/uL   Eosinophils Relative 1 0 - 5 %   Eosinophils Absolute 0.1 0.0 - 0.7 K/uL   Basophils Relative 0 0 - 1 %    Basophils Absolute 0.0 0.0 - 0.1 K/uL  Glucose, capillary     Status: None   Collection Time: 03/04/15  7:53 AM  Result Value Ref Range   Glucose-Capillary 93 70 - 99 mg/dL    Imaging / Studies: No results found.  Medications / Allergies:  Scheduled Meds: . enoxaparin (LOVENOX) injection  40 mg Subcutaneous Q24H  . insulin aspart  0-5 Units Subcutaneous QHS  . insulin aspart  0-9 Units Subcutaneous TID WC  . pantoprazole (PROTONIX) IV  40 mg Intravenous Q24H  . sodium chloride  500 mL Intravenous Once  . sodium chloride  10-40 mL Intracatheter Q12H   Continuous Infusions: . dextrose 5 % and 0.45 % NaCl with KCl 20 mEq/L 10 mL/hr at 03/02/15 0441   PRN Meds:.LORazepam, menthol-cetylpyridinium, morphine injection, ondansetron **OR** ondansetron (ZOFRAN) IV, oxyCODONE-acetaminophen, sodium chloride  Antibiotics: Anti-infectives    Start     Dose/Rate Route Frequency Ordered Stop   02/20/15 2000  piperacillin-tazobactam (ZOSYN) IVPB 3.375 g  Status:  Discontinued     3.375 g 12.5 mL/hr over 240 Minutes Intravenous Every 8 hours 02/20/15 1620 03/04/15 0844   02/18/15 1715  piperacillin-tazobactam (ZOSYN) IVPB 3.375 g  Status:  Discontinued     3.375 g 12.5 mL/hr over 240 Minutes Intravenous 3 times per day 02/18/15 1706 02/20/15 1620   02/18/15 1615  Ampicillin-Sulbactam (UNASYN) 3 g in sodium chloride 0.9 % 100 mL IVPB  Status:  Discontinued     3 g 100 mL/hr over 60 Minutes Intravenous  Once 02/18/15 1604 02/18/15 1624        Assessment/Plan Perforated sigmoid diverticulitis with intra-abdominal abscesses  POD#12 Emergency exploratory laparotomy and drainage of intra-abdominal abscesses, salpingo oophorectomy, descending colostomy, sigmoid colectomy, \cystoscopy---Dr. Zella Richer 02/20/15 WBC up to 13.1 from 12.1 despite being on zosyn. T max 99.4.  Increased abdominal pain.  She likely needs a repeat CT scan to rule out an abscess.  Will discuss with Dr. Hassell Done  -WOC for  ostomy teachin -BID wet to dry dressing changes -mobilize ABL anemia-s/p transfusion.  Stable. Monitor.  VTE prophylaxis-SCD/lovenox  DM II-SSI/CBGs FEN-change to regular diet.  Increase oxy scale ID-zosyn D#12.  Will stop.  Repeat CBC in AM.  Will repeat CT of abdomen and pelvis, discuss timing with attending. Last CT 5/3 showed a post op changes/phlegmon but could not exclude an abscess dispo-continue inpatient, work on pain management   Erby Pian, [facility] Pager [phone](7A-4:30P)   03/04/2015 8:52 AM

## 2015-03-05 ENCOUNTER — Encounter (HOSPITAL_COMMUNITY): Payer: Self-pay | Admitting: Radiology

## 2015-03-05 ENCOUNTER — Inpatient Hospital Stay (HOSPITAL_COMMUNITY): Payer: BLUE CROSS/BLUE SHIELD

## 2015-03-05 LAB — BASIC METABOLIC PANEL
Anion gap: 11 (ref 5–15)
BUN: 8 mg/dL (ref 6–20)
CHLORIDE: 105 mmol/L (ref 101–111)
CO2: 24 mmol/L (ref 22–32)
Calcium: 8.4 mg/dL — ABNORMAL LOW (ref 8.9–10.3)
Creatinine, Ser: 0.32 mg/dL — ABNORMAL LOW (ref 0.44–1.00)
Glucose, Bld: 81 mg/dL (ref 70–99)
POTASSIUM: 3.5 mmol/L (ref 3.5–5.1)
SODIUM: 140 mmol/L (ref 135–145)

## 2015-03-05 LAB — CBC
HEMATOCRIT: 26.4 % — AB (ref 36.0–46.0)
HEMOGLOBIN: 8.7 g/dL — AB (ref 12.0–15.0)
MCH: 28.5 pg (ref 26.0–34.0)
MCHC: 33 g/dL (ref 30.0–36.0)
MCV: 86.6 fL (ref 78.0–100.0)
Platelets: 381 10*3/uL (ref 150–400)
RBC: 3.05 MIL/uL — AB (ref 3.87–5.11)
RDW: 17.4 % — AB (ref 11.5–15.5)
WBC: 11.9 10*3/uL — AB (ref 4.0–10.5)

## 2015-03-05 LAB — GLUCOSE, CAPILLARY
GLUCOSE-CAPILLARY: 85 mg/dL (ref 70–99)
GLUCOSE-CAPILLARY: 114 mg/dL — AB (ref 70–99)
Glucose-Capillary: 80 mg/dL (ref 70–99)
Glucose-Capillary: 71 mg/dL (ref 70–99)

## 2015-03-05 MED ORDER — IOHEXOL 300 MG/ML  SOLN
100.0000 mL | Freq: Once | INTRAMUSCULAR | Status: AC | PRN
  Administered 2015-03-05: 100 mL via INTRAVENOUS

## 2015-03-05 NOTE — Progress Notes (Signed)
Patient ID: Alison Best, female   DOB: 1956-04-20, 59 y.o.   MRN: [id number]     [facility]      [address] [place], [place] [address]    Phone: [phone] FAX: [phone]     Subjective: Increased pain, sharp, ostomy is working.  Afebrile.  WBC down.   Objective:  Vital signs:  Filed Vitals:   03/04/15 0551 03/04/15 1407 03/04/15 2220 03/05/15 0548  BP: 108/51 96/32 106/42 105/44  Pulse: 85 87 91 91  Temp: 99.4 F (37.4 C) 98.3 F (36.8 C) 99.8 F (37.7 C) 98.5 F (36.9 C)  TempSrc: Oral Oral Oral Oral  Resp: 16 16 16 16   Height:      Weight:      SpO2: 96%  96% 94%    Last BM Date: 03/03/15  Intake/Output   Yesterday:  05/10 0701 - 05/11 0700 In: 1640 [P.O.:1440; I.V.:200] Out: 2225 [Urine:2125; Stool:100] This shift: I/O last 3 completed shifts: In: 2000 [P.O.:1680; I.V.:320] Out: 3375 [Urine:3250; Stool:125]    Physical Exam: General: Pt awake/alert/oriented x4 in no acute distress Chest: cta No chest wall pain w good excursion CV: Pulses intact. Regular rhythm MS: Normal AROM mjr joints. No obvious deformity Abdomen: Soft. Nondistended. Mildly tender at incisions only. Midline wound is c/d/i. LLQ ostomy is functioning. No evidence of peritonitis. No incarcerated hernias. Ext: SCDs BLE. No mjr edema. No cyanosis Skin: No petechiae / purpura    Problem List:   Active Problems:   Diverticulitis of large intestine with abscess without bleeding   Perforation of sigmoid colon    Results:   Labs: Results for orders placed or performed during the hospital encounter of 02/18/15 (from the past 48 hour(s))  Glucose, capillary     Status: None   Collection Time: 03/03/15 12:19 PM  Result Value Ref Range   Glucose-Capillary 99 70 - 99 mg/dL  Glucose, capillary     Status: None   Collection Time: 03/03/15  4:25 PM  Result Value Ref Range   Glucose-Capillary 91 70 - 99 mg/dL   Glucose, capillary     Status: None   Collection Time: 03/03/15 10:17 PM  Result Value Ref Range   Glucose-Capillary 82 70 - 99 mg/dL  CBC with Differential/Platelet     Status: Abnormal   Collection Time: 03/04/15  4:27 AM  Result Value Ref Range   WBC 13.1 (H) 4.0 - 10.5 K/uL   RBC 2.96 (L) 3.87 - 5.11 MIL/uL   Hemoglobin 8.5 (L) 12.0 - 15.0 g/dL   HCT 25.6 (L) 36.0 - 46.0 %   MCV 86.5 78.0 - 100.0 fL   MCH 28.7 26.0 - 34.0 pg   MCHC 33.2 30.0 - 36.0 g/dL   RDW 17.2 (H) 11.5 - 15.5 %   Platelets 365 150 - 400 K/uL   Neutrophils Relative % 74 43 - 77 %   Neutro Abs 9.8 (H) 1.7 - 7.7 K/uL   Lymphocytes Relative 14 12 - 46 %   Lymphs Abs 1.8 0.7 - 4.0 K/uL   Monocytes Relative 11 3 - 12 %   Monocytes Absolute 1.5 (H) 0.1 - 1.0 K/uL   Eosinophils Relative 1 0 - 5 %   Eosinophils Absolute 0.1 0.0 - 0.7 K/uL   Basophils Relative 0 0 - 1 %   Basophils Absolute 0.0 0.0 - 0.1 K/uL  Glucose, capillary     Status: None   Collection Time: 03/04/15  7:53 AM  Result Value Ref Range   Glucose-Capillary 93 70 - 99 mg/dL  Glucose, capillary     Status: None   Collection Time: 03/04/15 12:01 PM  Result Value Ref Range   Glucose-Capillary 89 70 - 99 mg/dL  Glucose, capillary     Status: Abnormal   Collection Time: 03/04/15  4:04 PM  Result Value Ref Range   Glucose-Capillary 107 (H) 70 - 99 mg/dL  Glucose, capillary     Status: None   Collection Time: 03/04/15 10:24 PM  Result Value Ref Range   Glucose-Capillary 97 70 - 99 mg/dL  CBC     Status: Abnormal   Collection Time: 03/05/15  5:15 AM  Result Value Ref Range   WBC 11.9 (H) 4.0 - 10.5 K/uL   RBC 3.05 (L) 3.87 - 5.11 MIL/uL   Hemoglobin 8.7 (L) 12.0 - 15.0 g/dL   HCT 26.4 (L) 36.0 - 46.0 %   MCV 86.6 78.0 - 100.0 fL   MCH 28.5 26.0 - 34.0 pg   MCHC 33.0 30.0 - 36.0 g/dL   RDW 17.4 (H) 11.5 - 15.5 %   Platelets 381 150 - 400 K/uL  Glucose, capillary     Status: None   Collection Time: 03/05/15  7:19 AM  Result Value Ref  Range   Glucose-Capillary 80 70 - 99 mg/dL    Imaging / Studies: No results found.  Medications / Allergies:  Scheduled Meds: . acetaminophen  1,000 mg Oral TID  . enoxaparin (LOVENOX) injection  40 mg Subcutaneous Q24H  . insulin aspart  0-5 Units Subcutaneous QHS  . insulin aspart  0-9 Units Subcutaneous TID WC  . pantoprazole (PROTONIX) IV  40 mg Intravenous Q24H  . sodium chloride  500 mL Intravenous Once  . sodium chloride  10-40 mL Intracatheter Q12H   Continuous Infusions: . dextrose 5 % and 0.45 % NaCl with KCl 20 mEq/L 10 mL/hr at 03/02/15 0441   PRN Meds:.LORazepam, menthol-cetylpyridinium, morphine injection, ondansetron **OR** ondansetron (ZOFRAN) IV, oxyCODONE, sodium chloride  Antibiotics: Anti-infectives    Start     Dose/Rate Route Frequency Ordered Stop   02/20/15 2000  piperacillin-tazobactam (ZOSYN) IVPB 3.375 g  Status:  Discontinued     3.375 g 12.5 mL/hr over 240 Minutes Intravenous Every 8 hours 02/20/15 1620 03/04/15 0844   02/18/15 1715  piperacillin-tazobactam (ZOSYN) IVPB 3.375 g  Status:  Discontinued     3.375 g 12.5 mL/hr over 240 Minutes Intravenous 3 times per day 02/18/15 1706 02/20/15 1620   02/18/15 1615  Ampicillin-Sulbactam (UNASYN) 3 g in sodium chloride 0.9 % 100 mL IVPB  Status:  Discontinued     3 g 100 mL/hr over 60 Minutes Intravenous  Once 02/18/15 1604 02/18/15 1624        Assessment/Plan Perforated sigmoid diverticulitis with intra-abdominal abscesses  POD#13 Emergency exploratory laparotomy and drainage of intra-abdominal abscesses, salpingo oophorectomy, descending colostomy, sigmoid colectomy, \cystoscopy---Dr. Zella Richer 02/20/15 Despite WBC being down, she has increased tenderness on exam.  Will repeat a CT of abdomen and pelvis to rule out an intra-abdominal abscess.  If negative, will adjust her pain medication.  For now, continue oxyIR scale, IV for breakthrough. Check BMP before CT.   -WOC for ostomy teaching -BID wet  to dry dressing changes -mobilize ABL anemia-s/p transfusion. Stable. Monitor.  VTE prophylaxis-SCD/lovenox  DM II-SSI/CBGs FEN-change to regular diet. Increase oxy scale ID-zosyn stopped D#12.  Last CT 5/3 showed a post op changes/phlegmon but could not exclude an abscess.  Repeat to ensure no new development  dispo-continue inpatient, work on pain management   Erby Pian, [facility] Pager [phone](7A-4:30P)   03/05/2015 8:50 AM

## 2015-03-05 NOTE — Progress Notes (Signed)
PT Cancellation Note  Patient Details Name: Alison Best MRN: [id number] DOB: 26-Sep-1956   Cancelled Treatment:    Reason Eval/Treat Not Completed: Patient at procedure or test/unavailable--pt leaving for CT. Will check back another time. Thanks.    Weston Anna, MPT Pager: [phone]

## 2015-03-06 LAB — GLUCOSE, CAPILLARY: GLUCOSE-CAPILLARY: 101 mg/dL — AB (ref 65–99)

## 2015-03-06 MED ORDER — ACETAMINOPHEN 325 MG PO TABS: 650.0000 mg | ORAL_TABLET | Freq: Four times a day (QID) | ORAL | Status: DC | PRN

## 2015-03-06 MED ORDER — METFORMIN HCL 500 MG PO TABS: 500.0000 mg | ORAL_TABLET | Freq: Two times a day (BID) | ORAL | Status: DC

## 2015-03-06 MED ORDER — OXYCODONE HCL 5 MG PO TABS: 5.0000 mg | ORAL_TABLET | ORAL | Status: DC | PRN

## 2015-03-06 NOTE — Discharge Summary (Signed)
Physician Discharge Summary  Alison Best [facility] GBT:[id number] DOB: [birth year] DOA: 02/18/2015  PCP: Laurey Morale, MD  Consultation: [facility]   Interventional radiology   Admit date: 02/18/2015 Discharge date: 03/06/2015  Recommendations for Outpatient Follow-up:   Follow-up Information    Follow up with Odis Hollingshead, MD On 03/19/2015.   Specialty:  General Surgery   Why:  arrive by 10:10 10:40AM post op check   Contact information:   [address] [place] [address] [phone]      Discharge Diagnoses:  1. Perforated sigmoid diverticulitis with intra-abdominal abscesses 2. Emergency exploratory laparotomy and drainage of intra-abdominal abscesses, salpingo oophorectomy, descending colostomy, sigmoid colectomy, cystoscopy  3. ABL anemia 4. DM II 5. Coagulopathy  6. UTI   Surgical Procedure: Emergency exploratory laparotomy and drainage of intra-abdominal abscesses, salpingo oophorectomy, descending colostomy, sigmoid colectomy, cystoscopy ---Dr. Brien Mates   Discharge Condition: stable Disposition: rehab  Diet recommendation: regular  Filed Weights   02/18/15 1058 02/22/15 0442 02/25/15 0400  Weight: 75.751 kg (167 lb) 89.2 kg (196 lb 10.4 oz) 84.9 kg (187 lb 2.7 oz)     Filed Vitals:   03/06/15 0536  BP: 112/45  Pulse: 95  Temp: 99.5 F (37.5 C)  Resp: 16    HPI: This is a 59 year old female with a history of 6-7 bouts of diverticulitis in the past. About 2 weeks ago, while on a cruise ship, she noted some lower abdominal discomfort and a ribbon like stools. The discomfort persisted and she felt weak. This ship's Dr. felt she may have diverticulitis and gave her some Flagyl, which she could not keep down, and amoxicillin. [place] and has not been doing better. In fact the pain is increasing and she has fever. She presented to the emergency department and was noted to have a leukocytosis. CT scan was performed  demonstrating findings consistent with acute sigmoid diverticulitis with 2 separate abscesses, one next to the sigmoid colon and one in the pelvis area. We were asked to see her because of this.    Hospital Course:  The patient was admitted and placed on IV antibiotics.  Interventional radiology was consulted for drainage and had a transgluteal drain placed.  The following day, the patient exhibited peritoneal signs and underwent the procedure listed above.  Prior to surgery, she was found to have an elevated INR despite being on any medication.  This was reversed with vitamin K and FFP pre operatively.  This was likely related to acute abdominal process and she had no furher issues.  Intraoperatively, urology was consulted for questionable ureter injury, however, there was no apparent injury.  Post operatively, she was transferred to SDU.    She was started on TPN for prolonged ileus and malnutrition.   ZOsyn was stopped on POD#12 which also covered citrobater and enterococcus UTI.  A repeat CT scan of abdomen and pelvis on POD#12 showed a 2.3x4.1cm ill defined gas and fluid collection left psoas.  She remained afebrile without antibiotics and not felt to be an abscess. Will follow this closely to ensure no evolution or development of abscess which she is certainly at a higher risk for.  She was given PRBCs for hgb of 6.6.  Follow up CBCs remained stable. Dressing changes were continued to open abdominal wound.  Diet was advanced and TPN stopped as ostomy functioned.  On POD#13 the patient was tolerating a diet, having BMs, pain controlled, ambulating and felt stable for discharge  to rehab.  Medication risks, benefits and therapeutic alternatives were reviewed with the patient.  She verbalizes understanding. A follow up appointment was provided.  She was encouraged to call with any questions or concerns.  Warning signs that warrant further evaluation were discussed.    Physical Exam: General: Pt  awake/alert/oriented x4 in no acute distress Chest: cta No chest wall pain w good excursion CV: Pulses intact. Regular rhythm MS: Normal AROM mjr joints. No obvious deformity Abdomen: Soft. Nondistended. Mildly tender at incisions only. would is clean, fascia is intact with 2 visible sutures bottom of the wound with some fibrinous tissue.   LLQ ostomy is functioning. No evidence of peritonitis. No incarcerated hernias. Ext: SCDs BLE. No mjr edema. No cyanosis Skin: No petechiae / purpura  Discharge Instructions     Medication List    STOP taking these medications        amoxicillin-clavulanate 875-125 MG per tablet  Commonly known as:  AUGMENTIN     metroNIDAZOLE 500 MG tablet  Commonly known as:  FLAGYL      TAKE these medications        acetaminophen 325 MG tablet  Commonly known as:  TYLENOL  Take 2 tablets (650 mg total) by mouth every 6 (six) hours as needed for moderate pain or headache.     albuterol 108 (90 BASE) MCG/ACT inhaler  Commonly known as:  PROAIR HFA  Inhale 2 puffs into the lungs every 4 (four) hours as needed for wheezing.     celecoxib 200 MG capsule  Commonly known as:  CELEBREX  Take 200 mg by mouth daily.     EPINEPHrine 0.3 mg/0.3 mL Soaj injection  Commonly known as:  EPI-PEN  Inject 0.3 mLs (0.3 mg total) into the muscle once.     fenofibrate 160 MG tablet  Take 1 tablet (160 mg total) by mouth daily.     furosemide 20 MG tablet  Commonly known as:  LASIX  TAKE 1 TABLET BY MOUTH DAILY AS NEEDED (FLUID)     metFORMIN 500 MG tablet  Commonly known as:  GLUCOPHAGE  Take 1 tablet (500 mg total) by mouth 2 (two) times daily with a meal.  Start taking on:  03/08/2015     methimazole 5 MG tablet  Commonly known as:  TAPAZOLE  TAKE 1 TABLET BY MOUTH DAILY     oxyCODONE 5 MG immediate release tablet  Commonly known as:  Oxy IR/ROXICODONE  Take 1-3 tablets (5-15 mg total) by mouth every 4 (four) hours as needed (mild 5mg , moderate  10mg , severe 15mg ).           Follow-up Information    Follow up with Odis Hollingshead, MD On 03/19/2015.   Specialty:  General Surgery   Why:  arrive by 10:10 10:40AM post op check   Contact information:   [address] [place] [address] [phone]        The results of significant diagnostics from this hospitalization (including imaging, microbiology, ancillary and laboratory) are listed below for reference.    Significant Diagnostic Studies: Ct Abdomen Pelvis W Contrast  03/05/2015   CLINICAL DATA:  Left pelvic pain, pelvic drain removed 3-4 days ago  EXAM: CT ABDOMEN AND PELVIS WITH CONTRAST  TECHNIQUE: Multidetector CT imaging of the abdomen and pelvis was performed using the standard protocol following bolus administration of intravenous contrast.  CONTRAST:  11mL OMNIPAQUE IOHEXOL 300 MG/ML  SOLN  COMPARISON:  02/25/2015  FINDINGS: Lower chest:  Small right and trace left pleural effusions. Associated bilateral lower lobe atelectasis.  Hepatobiliary: Liver is within normal limits.  Gallbladder is unremarkable. No intrahepatic or extrahepatic ductal dilatation.  Pancreas: Within normal limits.  Spleen: Within normal limits.  Adrenals/Urinary Tract: Adrenal glands are within normal limits.  Kidneys within normal limits.  No hydronephrosis.  Bladder is underdistended.  Stomach/Bowel: Stomach is within normal limits.  No evidence of bowel obstruction.  Prior appendectomy.  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  Vascular/Lymphatic: No evidence of abdominal aortic aneurysm.  Small upper abdominal lymph nodes, including a 11 mm short axis portacaval node (series 2/ image 30), likely reactive. Small retroperitoneal/ pelvic lymph nodes which do not meet pathologic CT size criteria.  Reproductive: Uterus is within normal limits.  No adnexal masses.  Other: 3.3 x 4.1 cm ill-defined gas and fluid collection anterior to the left psoas  (series 2/ image 65), previously 2.2 x 3.2 cm, although without well-defined rim.  Layering fluid in the left pelvis measuring 2.4 x 2.7 cm (series 2/image 70), again without well-defined rim.  Minimal presacral fluid (series 2/image 31), unchanged.  Prior pelvic drain has been removed.  Postsurgical changes in the midline anterior abdominal wall.  Musculoskeletal: Mild degenerative changes of the visualized thoracolumbar spine.  IMPRESSION: Prior pelvic drain has been removed.  3.3 x 4.1 cm ill-defined gas and fluid collection anterior to the left psoas muscle, mildly increased, although without a well-defined rim.  Additional layering fluid in the left pelvis.   Electronically Signed   By: Julian Hy M.D.   On: 03/05/2015 15:35   Ct Abdomen Pelvis W Contrast  02/25/2015   CLINICAL DATA:  Patient with prior exploratory laparotomy and drainage of intra-abdominal abscess with sigmoid colectomy and left salpingo-oophorectomy.  EXAM: CT ABDOMEN AND PELVIS WITH CONTRAST  TECHNIQUE: Multidetector CT imaging of the abdomen and pelvis was performed using the standard protocol following bolus administration of intravenous contrast.  CONTRAST:  145mL OMNIPAQUE IOHEXOL 300 MG/ML  SOLN  COMPARISON:  CT 02/18/2015  FINDINGS: Lower chest: Small right-greater-than-left pleural effusions with underlying right and left lower lobe consolidation suggestive of atelectasis. Normal heart size. Enteric tube terminates in the stomach.  Hepatobiliary: Liver is normal in size and contour without focal hepatic lesion identified. Gallbladder is unremarkable. No intrahepatic or extrahepatic biliary ductal dilatation.  Pancreas: Unremarkable  Spleen: Unremarkable  Adrenals/Urinary Tract: The adrenal glands are normal. The kidneys enhance symmetrically with contrast. No hydronephrosis. There is mild hyperenhancement of the urothelium of the mid to distal left ureter near the pelvic inflammatory process. Gas within the urinary bladder  likely secondary to prior instrumentation.  Stomach/Bowel: Patient status post sigmoid colectomy. Left lower quadrant colostomy is present. No abnormal bowel dilatation to suggest bowel obstruction.  Vascular/Lymphatic: Normal caliber abdominal aorta. No retroperitoneal or pelvic lymphadenopathy. Multiple sub cm pelvic lymph nodes likely reactive in etiology.  Other: Postsurgical changes within the midline abdominal wall compatible with exploratory laparotomy. Pelvic drain is present coursing from the right lower quadrant anterior abdominal wall soft tissues and terminating within the left lower quadrant. Within the left lower quadrant anterior to the external iliac vessels there is a 3.2 x 3.1 x 2.2 cm fluid and gas containing collection without definable wall (image 65; series 2). Additionally there is mild rim enhancement demonstrated about fluid within the pelvis. Uterus is grossly unremarkable. Right ovary is grossly unremarkable.  Musculoskeletal: No aggressive or acute appearing osseous lesions. Lower lumbar spine  degenerative changes.  IMPRESSION: Fluid and gas collection within the left lower quadrant may represent postoperative/phlegmonous change however early abscess formation is not excluded. The pelvic surgical drain appears to coarse adjacent to this collection. There is an additional small amount of fluid within the pelvis anterior to the uterus and superior to the bladder. Minimal surrounding enhancement raises the possibility of peritonitis.  Small bilateral pleural effusions and underlying pulmonary consolidation, suggestive of atelectasis.  Postsurgical changes within the midline of the abdomen.  Left lower quadrant colostomy.  These results will be called to the ordering clinician or representative by the Radiologist Assistant, and communication documented in the PACS or zVision Dashboard.   Electronically Signed   By: Lovey Newcomer M.D.   On: 02/25/2015 14:55   Ct Abdomen Pelvis W  Contrast  02/18/2015   CLINICAL DATA:  Abdominal pain with fever and diarrhea  EXAM: CT ABDOMEN AND PELVIS WITH CONTRAST  TECHNIQUE: Multidetector CT imaging of the abdomen and pelvis was performed using the standard protocol following bolus administration of intravenous contrast. Oral contrast was also administered.  CONTRAST:  146mL OMNIPAQUE IOHEXOL 300 MG/ML  SOLN  COMPARISON:  None.  FINDINGS: Lung bases are clear.  Liver is prominent, measuring 16.8 cm in length. No focal liver lesions are identified. Gallbladder wall is not thickened. There is no biliary duct dilatation.  Spleen, pancreas, adrenals appear normal. Kidneys bilaterally show no mass or hydronephrosis on either side. There is no renal or ureteral calculus on either side.  In the pelvis, the urinary bladder is midline with normal wall thickness. There is irregular wall thickening in the mid to distal sigmoid colon with a complex fluid collection measuring 4.5 x 3.3 x 2.3 cm consistent with a developing diverticular abscess. There are several nearby tiny foci of air suggesting microperforation. There is a loculated fluid collection in the posterior pelvis separate from of the apparent diverticular abscess measuring 4.2 x 2.7 x 2.6 cm. This fluid may represent complex ascites but also may represent an early second abscess. This fluid collection is near but separate from the upper rectum. There is no well-defined mass in the pelvis. Appendix is absent.  There is no bowel obstruction. No free air apart from the microperforation in the sigmoid colon region or portal venous air. There is no adenopathy in the abdomen or pelvis. There is no abdominal aortic aneurysm. There are no blastic or lytic bone lesions.  IMPRESSION: Evidence of diverticulitis with developing diverticular abscess in the mid to distal sigmoid colon with apparent minimal micro- perforation. Question loculated ascites versus developing second abscess in the dependent portion of the  pelvis posteriorly.  No bowel obstruction. Appendix absent. Liver prominent without focal liver lesion.  Critical Value/emergent results were called by telephone at the time of interpretation on 02/18/2015 at 4:03 pm to Dr. Lacretia Leigh , who verbally acknowledged these results.   Electronically Signed   By: Lowella Grip III M.D.   On: 02/18/2015 16:03   Dg Abd Portable 1v  02/20/2015   CLINICAL DATA:  Lower abdominal pain  EXAM: PORTABLE ABDOMEN - 1 VIEW  COMPARISON:  CT abdomen and pelvis February 18, 2015  FINDINGS: A drain is noted in the pelvis from right-sided approach. There is no bowel dilatation or air-fluid level suggesting obstruction. No free air is seen. No abnormal calcifications.  IMPRESSION: Bowel gas pattern unremarkable.  Drain in pelvis noted.   Electronically Signed   By: Lowella Grip III M.D.   On: 02/20/2015  09:53   Ct Image Guided Drainage Percut Cath  Peritoneal Retroperit  02/19/2015   CLINICAL DATA:  Pelvic abscess  EXAM: CT CORE BIOPSY RENAL  FLUOROSCOPY TIME:  None  MEDICATIONS AND MEDICAL HISTORY: Versed 3 mg, Fentanyl 50 mcg.  Additional Medications: None.  ANESTHESIA/SEDATION: Moderate sedation time: 11 minutes  CONTRAST:  None  PROCEDURE: The procedure, risks, benefits, and alternatives were explained to the patient. Questions regarding the procedure were encouraged and answered. The patient understands and consents to the procedure.  The right gluteal region in the prone position was prepped with Betadine in a sterile fashion, and a sterile drape was applied covering the operative field. A sterile gown and sterile gloves were used for the procedure.  Under CT guidance, an 18 gauge needle was inserted into the pelvic abscess via right trans gluteal approach and removed over an Amplatz. A 12 French dilator followed by a 12 [place] drain were inserted. It was looped and string fixed and sewn to the skin. Cloudy sanguinous fluid was aspirated.  FINDINGS: Images document drain  placement into a right pelvic abscess via right trans gluteal approach.  COMPLICATIONS: None  IMPRESSION: Successful right pelvic trans gluteal 12 French abscess drain.   Electronically Signed   By: Marybelle Killings M.D.   On: 02/19/2015 15:51    Microbiology: No results found for this or any previous visit (from the past 240 hour(s)).   Labs: Basic Metabolic Panel:  Recent Labs Lab 02/28/15 0457 03/01/15 0700 03/02/15 0610 03/03/15 0600 03/05/15 0904  NA 139 139 141 135 140  K 3.6 4.0 4.2 3.9 3.5  CL 107 109 108 105 105  CO2 29 26 27 25 24   GLUCOSE 117* 116* 101* 100* 81  BUN 10 17 19 13 8   CREATININE 0.43* 0.37* 0.37* 0.36* 0.32*  CALCIUM 8.1* 8.4* 8.5* 8.2* 8.4*  MG  --  2.0  --  1.7  --   PHOS  --  3.7  --  4.3  --    Liver Function Tests:  Recent Labs Lab 03/03/15 0600  AST 14*  ALT 12*  ALKPHOS 76  BILITOT 0.8  PROT 5.3*  ALBUMIN 2.2*   No results for input(s): LIPASE, AMYLASE in the last 168 hours. No results for input(s): AMMONIA in the last 168 hours. CBC:  Recent Labs Lab 02/28/15 0457 03/01/15 0700 03/03/15 0600 03/04/15 0427 03/05/15 0515  WBC 8.7 9.5 12.1* 13.1* 11.9*  NEUTROABS  --   --  9.1* 9.8*  --   HGB 6.6* 9.3* 8.6* 8.5* 8.7*  HCT 20.5* 28.0* 26.2* 25.6* 26.4*  MCV 84.7 85.6 87.0 86.5 86.6  PLT 267 311 347 365 381   Cardiac Enzymes: No results for input(s): CKTOTAL, CKMB, CKMBINDEX, TROPONINI in the last 168 hours. BNP: BNP (last 3 results) No results for input(s): BNP in the last 8760 hours.  ProBNP (last 3 results) No results for input(s): PROBNP in the last 8760 hours.  CBG:  Recent Labs Lab 03/05/15 0719 03/05/15 1144 03/05/15 1610 03/05/15 2139 03/06/15 0759  GLUCAP 80 71 85 114* 101*    Active Problems:   Diverticulitis of large intestine with abscess without bleeding   Perforation of sigmoid colon   Time coordinating discharge: <30 mins  Signed:  Deshondra Worst, ANP-BC

## 2015-03-06 NOTE — Progress Notes (Signed)
CSW assisting with d/c planning. [employer] has approved SNF placement for pt today if ready for d/c. Pt will be d/c to [facility].  Werner Lean LCSW [phone]

## 2015-03-06 NOTE — Discharge Instructions (Signed)

## 2015-03-06 NOTE — Clinical Social Work Placement (Signed)
   CLINICAL SOCIAL WORK PLACEMENT  NOTE  Date:  03/06/2015  Patient Details  Name: Alison Best MRN: [id number] Date of Birth: 23-Jan-1956  Clinical Social Work is seeking post-discharge placement for this patient at the [place] level of care (*CSW will initial, date and re-position this form in  chart as items are completed):  Yes   Patient/family provided with [place] Work Department's list of facilities offering this level of care within the geographic area requested by the patient (or if unable, by the patient's family).  Yes   Patient/family informed of their freedom to choose among providers that offer the needed level of care, that participate in Medicare, Medicaid or managed care program needed by the patient, have an available bed and are willing to accept the patient.  Yes   Patient/family informed of [facility]'s ownership interest in [facility] and [facility], as well as of the fact that they are under no obligation to receive care at these facilities.  PASRR submitted to EDS on 03/04/15     PASRR number received on 03/04/15     Existing PASRR number confirmed on       FL2 transmitted to all facilities in geographic area requested by pt/family on       FL2 transmitted to all facilities within larger geographic area on 03/04/15     Patient informed that his/her managed care company has contracts with or will negotiate with certain facilities, including the following:            Patient/family informed of bed offers received.  Patient chooses bed at  [facility]     Physician recommends and patient chooses bed at      Patient to be transferred to  [name] on  03/06/15.  Patient to be transferred to facility by  [facility]  Patient family notified on  03/06/15 of transfer.  Name of family member notified:    Pt contacted spouse directly.    PHYSICIAN       Additional Comment: Pt is in agreement with d/c to  [name] today. Family will provide transportation. [employer] has provided prior authorization for SNF. NSG has reviewed d/c summary, scripts, avs. Scripts have been included in d/c packet. D/C packet provided to pt prior to d/c.  Werner Lean LCSW [phone]   _______________________________________________ Luretha Rued, LCSW 03/06/2015, 12:04 PM

## 2015-03-06 NOTE — Progress Notes (Signed)
RUA PICC removed per order.  Site without signs of infection, cleaned with CHG, covered with vaseline guaze and dry 4x4.  Pt verbalizwes understanding of site and dressing care.  No ADN.

## 2015-03-07 ENCOUNTER — Non-Acute Institutional Stay (SKILLED_NURSING_FACILITY): Payer: BLUE CROSS/BLUE SHIELD | Admitting: Adult Health

## 2015-03-07 ENCOUNTER — Encounter: Payer: Self-pay | Admitting: Adult Health

## 2015-03-07 DIAGNOSIS — D72829 Elevated white blood cell count, unspecified: Secondary | ICD-10-CM

## 2015-03-07 DIAGNOSIS — K5720 Diverticulitis of large intestine with perforation and abscess without bleeding: Secondary | ICD-10-CM

## 2015-03-07 DIAGNOSIS — D62 Acute posthemorrhagic anemia: Secondary | ICD-10-CM

## 2015-03-07 DIAGNOSIS — K631 Perforation of intestine (nontraumatic): Secondary | ICD-10-CM | POA: Diagnosis not present

## 2015-03-07 DIAGNOSIS — E43 Unspecified severe protein-calorie malnutrition: Secondary | ICD-10-CM

## 2015-03-07 DIAGNOSIS — E785 Hyperlipidemia, unspecified: Secondary | ICD-10-CM

## 2015-03-07 DIAGNOSIS — E0590 Thyrotoxicosis, unspecified without thyrotoxic crisis or storm: Secondary | ICD-10-CM

## 2015-03-07 DIAGNOSIS — E119 Type 2 diabetes mellitus without complications: Secondary | ICD-10-CM | POA: Diagnosis not present

## 2015-03-07 DIAGNOSIS — R5381 Other malaise: Secondary | ICD-10-CM | POA: Diagnosis not present

## 2015-03-10 NOTE — Progress Notes (Signed)
Patient ID: Alison Best, female   DOB: 1956/09/07, 59 y.o.   MRN: [id number]   03/07/15  Facility:  [facility] Location:  [place] Room Number: 908-P LEVEL OF CARE:  SNF (31)    Chief Complaint  Patient presents with  . Hospitalization Follow-up    HISTORY OF PRESENT ILLNESS:   This is a 59 year old female who was been admitted to [facility] on 03/06/15 from [facility]. She was experiencing lower abdominal discomfort towards and a ribbonlike stools while in a cruise ship. The ship doctor gave her Flagyl and amoxicillin but could not keep down. When she arrived in the [name], she was having increasing pain and has fever. She presented to the ED for further evaluation. CT scan showed findings consistent with acute sigmoid diverticulitis with 2 separate abscesses, one next to the sigmoid colon and one in the pelvis area. She was placed on IV antibiotics. IR placed transgluteal drain. Emergency exploratory laparotomy and drainage of intra-abdominal abscesses, salpingo-oophorectomy, descending colon and semi-, cystoscopy was done on 02/20/15. She was given vitamin K and fresh [place] plasma preoperatively for elevated INR. Postoperatively, she was started on TPN for for prolonged ileus and malnutrition. She was given packed RBCs for hemoglobin of 6.6. Diet was advanced and TPN stopped as ostomy functioned.  She has been admitted for a short-term rehabilitation.  PAST MEDICAL HISTORY:  Past Medical History  Diagnosis Date  . Diverticulitis of colon   . Obesity   . Hyperlipidemia   . Sleep apnea   . Heart murmur   . Irregular heartbeat   . Thyroid disease     hyperthyroid  . Bursitis   . Ectopic pregnancy     CURRENT MEDICATIONS: Reviewed per MAR/see medication list  Allergies  Allergen Reactions  . Levaquin [Levofloxacin] Other (See Comments)    Achilles tendon weakness     REVIEW OF SYSTEMS:  GENERAL: no change in appetite, no  fatigue, no weight changes, no fever, chills or weakness RESPIRATORY: no cough, SOB, DOE, wheezing, hemoptysis CARDIAC: no chest pain, edema or palpitations GI: no abdominal pain, diarrhea, constipation, heart burn, nausea or vomiting  PHYSICAL EXAMINATION  GENERAL: no acute distress, normal body habitus SKIN:  Midline abdominal open wound with packing, no redness EYES: conjunctivae normal, sclerae normal, normal eye lids NECK: supple, trachea midline, no neck masses, no thyroid tenderness, no thyromegaly LYMPHATICS: no LAN in the neck, no supraclavicular LAN RESPIRATORY: breathing is even & unlabored, BS CTAB CARDIAC: RRR, no murmur,no extra heart sounds, no edema GI: abdomen soft, normal BS, no masses, no tenderness, no hepatomegaly, no splenomegaly; LLQ colostomy with yellowish stool EXTREMITIES: Able to move 4 extremities PSYCHIATRIC: the patient is alert & oriented to person, affect & behavior appropriate  LABS/RADIOLOGY: Labs reviewed: Basic Metabolic Panel:  Recent Labs  02/27/15 0530  03/01/15 0700 03/02/15 0610 03/03/15 0600 03/05/15 0904  NA 135  < > 139 141 135 140  K 3.9  < > 4.0 4.2 3.9 3.5  CL 100*  < > 109 108 105 105  CO2 31  < > 26 27 25 24   GLUCOSE 114*  < > 116* 101* 100* 81  BUN 10  < > 17 19 13 8   CREATININE 0.33*  < > 0.37* 0.37* 0.36* 0.32*  CALCIUM 7.9*  < > 8.4* 8.5* 8.2* 8.4*  MG 1.8  --  2.0  --  1.7  --   PHOS 4.2  --  3.7  --  4.3  --   < > = values in this interval not displayed. Liver Function Tests:  Recent Labs  02/26/15 0600 02/27/15 0530 03/03/15 0600  AST 13* 14* 14*  ALT 13* 11* 12*  ALKPHOS 68 62 76  BILITOT 0.6 0.5 0.8  PROT 4.5* 4.5* 5.3*  ALBUMIN 1.8* 1.7* 2.2*    Recent Labs  02/18/15 1354  LIPASE 26    Recent Labs  02/26/15 0600  03/03/15 0600 03/04/15 0427 03/05/15 0515  WBC 16.1*  < > 12.1* 13.1* 11.9*  NEUTROABS 12.4*  --  9.1* 9.8*  --   HGB 7.4*  < > 8.6* 8.5* 8.7*  HCT 23.1*  < > 26.2* 25.6* 26.4*    MCV 84.3  < > 87.0 86.5 86.6  PLT 302  < > 347 365 381  < > = values in this interval not displayed.  Lipid Panel:  Recent Labs  05/23/14 0933  HDL 31.60*   CBG:  Recent Labs  03/05/15 1610 03/05/15 2139 03/06/15 0759  GLUCAP 85 114* 101*     Ct Abdomen Pelvis W Contrast  03/05/2015   CLINICAL DATA:  Left pelvic pain, pelvic drain removed 3-4 days ago  EXAM: CT ABDOMEN AND PELVIS WITH CONTRAST  TECHNIQUE: Multidetector CT imaging of the abdomen and pelvis was performed using the standard protocol following bolus administration of intravenous contrast.  CONTRAST:  143mL OMNIPAQUE IOHEXOL 300 MG/ML  SOLN  COMPARISON:  02/25/2015  FINDINGS: Lower chest: Small right and trace left pleural effusions. Associated bilateral lower lobe atelectasis.  Hepatobiliary: Liver is within normal limits.  Gallbladder is unremarkable. No intrahepatic or extrahepatic ductal dilatation.  Pancreas: Within normal limits.  Spleen: Within normal limits.  Adrenals/Urinary Tract: Adrenal glands are within normal limits.  Kidneys within normal limits.  No hydronephrosis.  Bladder is underdistended.  Stomach/Bowel: Stomach is within normal limits.  No evidence of bowel obstruction.  Prior appendectomy.  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  Vascular/Lymphatic: No evidence of abdominal aortic aneurysm.  Small upper abdominal lymph nodes, including a 11 mm short axis portacaval node (series 2/ image 30), likely reactive. Small retroperitoneal/ pelvic lymph nodes which do not meet pathologic CT size criteria.  Reproductive: Uterus is within normal limits.  No adnexal masses.  Other: 3.3 x 4.1 cm ill-defined gas and fluid collection anterior to the left psoas (series 2/ image 65), previously 2.2 x 3.2 cm, although without well-defined rim.  Layering fluid in the left pelvis measuring 2.4 x 2.7 cm (series 2/image 70), again without well-defined rim.  Minimal presacral fluid  (series 2/image 31), unchanged.  Prior pelvic drain has been removed.  Postsurgical changes in the midline anterior abdominal wall.  Musculoskeletal: Mild degenerative changes of the visualized thoracolumbar spine.  IMPRESSION: Prior pelvic drain has been removed.  3.3 x 4.1 cm ill-defined gas and fluid collection anterior to the left psoas muscle, mildly increased, although without a well-defined rim.  Additional layering fluid in the left pelvis.   Electronically Signed   By: Julian Hy M.D.   On: 03/05/2015 15:35   Ct Abdomen Pelvis W Contrast  02/25/2015   CLINICAL DATA:  Patient with prior exploratory laparotomy and drainage of intra-abdominal abscess with sigmoid colectomy and left salpingo-oophorectomy.  EXAM: CT ABDOMEN AND PELVIS WITH CONTRAST  TECHNIQUE: Multidetector CT imaging of the abdomen and pelvis was performed using the standard protocol following bolus administration of intravenous contrast.  CONTRAST:  158mL OMNIPAQUE IOHEXOL 300  MG/ML  SOLN  COMPARISON:  CT 02/18/2015  FINDINGS: Lower chest: Small right-greater-than-left pleural effusions with underlying right and left lower lobe consolidation suggestive of atelectasis. Normal heart size. Enteric tube terminates in the stomach.  Hepatobiliary: Liver is normal in size and contour without focal hepatic lesion identified. Gallbladder is unremarkable. No intrahepatic or extrahepatic biliary ductal dilatation.  Pancreas: Unremarkable  Spleen: Unremarkable  Adrenals/Urinary Tract: The adrenal glands are normal. The kidneys enhance symmetrically with contrast. No hydronephrosis. There is mild hyperenhancement of the urothelium of the mid to distal left ureter near the pelvic inflammatory process. Gas within the urinary bladder likely secondary to prior instrumentation.  Stomach/Bowel: Patient status post sigmoid colectomy. Left lower quadrant colostomy is present. No abnormal bowel dilatation to suggest bowel obstruction.  Vascular/Lymphatic:  Normal caliber abdominal aorta. No retroperitoneal or pelvic lymphadenopathy. Multiple sub cm pelvic lymph nodes likely reactive in etiology.  Other: Postsurgical changes within the midline abdominal wall compatible with exploratory laparotomy. Pelvic drain is present coursing from the right lower quadrant anterior abdominal wall soft tissues and terminating within the left lower quadrant. Within the left lower quadrant anterior to the external iliac vessels there is a 3.2 x 3.1 x 2.2 cm fluid and gas containing collection without definable wall (image 65; series 2). Additionally there is mild rim enhancement demonstrated about fluid within the pelvis. Uterus is grossly unremarkable. Right ovary is grossly unremarkable.  Musculoskeletal: No aggressive or acute appearing osseous lesions. Lower lumbar spine degenerative changes.  IMPRESSION: Fluid and gas collection within the left lower quadrant may represent postoperative/phlegmonous change however early abscess formation is not excluded. The pelvic surgical drain appears to coarse adjacent to this collection. There is an additional small amount of fluid within the pelvis anterior to the uterus and superior to the bladder. Minimal surrounding enhancement raises the possibility of peritonitis.  Small bilateral pleural effusions and underlying pulmonary consolidation, suggestive of atelectasis.  Postsurgical changes within the midline of the abdomen.  Left lower quadrant colostomy.  These results will be called to the ordering clinician or representative by the Radiologist Assistant, and communication documented in the PACS or zVision Dashboard.   Electronically Signed   By: Lovey Newcomer M.D.   On: 02/25/2015 14:55   Ct Abdomen Pelvis W Contrast  02/18/2015   CLINICAL DATA:  Abdominal pain with fever and diarrhea  EXAM: CT ABDOMEN AND PELVIS WITH CONTRAST  TECHNIQUE: Multidetector CT imaging of the abdomen and pelvis was performed using the standard protocol  following bolus administration of intravenous contrast. Oral contrast was also administered.  CONTRAST:  119mL OMNIPAQUE IOHEXOL 300 MG/ML  SOLN  COMPARISON:  None.  FINDINGS: Lung bases are clear.  Liver is prominent, measuring 16.8 cm in length. No focal liver lesions are identified. Gallbladder wall is not thickened. There is no biliary duct dilatation.  Spleen, pancreas, adrenals appear normal. Kidneys bilaterally show no mass or hydronephrosis on either side. There is no renal or ureteral calculus on either side.  In the pelvis, the urinary bladder is midline with normal wall thickness. There is irregular wall thickening in the mid to distal sigmoid colon with a complex fluid collection measuring 4.5 x 3.3 x 2.3 cm consistent with a developing diverticular abscess. There are several nearby tiny foci of air suggesting microperforation. There is a loculated fluid collection in the posterior pelvis separate from of the apparent diverticular abscess measuring 4.2 x 2.7 x 2.6 cm. This fluid may represent complex ascites but also may represent an  early second abscess. This fluid collection is near but separate from the upper rectum. There is no well-defined mass in the pelvis. Appendix is absent.  There is no bowel obstruction. No free air apart from the microperforation in the sigmoid colon region or portal venous air. There is no adenopathy in the abdomen or pelvis. There is no abdominal aortic aneurysm. There are no blastic or lytic bone lesions.  IMPRESSION: Evidence of diverticulitis with developing diverticular abscess in the mid to distal sigmoid colon with apparent minimal micro- perforation. Question loculated ascites versus developing second abscess in the dependent portion of the pelvis posteriorly.  No bowel obstruction. Appendix absent. Liver prominent without focal liver lesion.  Critical Value/emergent results were called by telephone at the time of interpretation on 02/18/2015 at 4:03 pm to Dr. Lacretia Leigh , who verbally acknowledged these results.   Electronically Signed   By: Lowella Grip III M.D.   On: 02/18/2015 16:03   Dg Abd Portable 1v  02/20/2015   CLINICAL DATA:  Lower abdominal pain  EXAM: PORTABLE ABDOMEN - 1 VIEW  COMPARISON:  CT abdomen and pelvis February 18, 2015  FINDINGS: A drain is noted in the pelvis from right-sided approach. There is no bowel dilatation or air-fluid level suggesting obstruction. No free air is seen. No abnormal calcifications.  IMPRESSION: Bowel gas pattern unremarkable.  Drain in pelvis noted.   Electronically Signed   By: Lowella Grip III M.D.   On: 02/20/2015 09:53   Ct Image Guided Drainage Percut Cath  Peritoneal Retroperit  02/19/2015   CLINICAL DATA:  Pelvic abscess  EXAM: CT CORE BIOPSY RENAL  FLUOROSCOPY TIME:  None  MEDICATIONS AND MEDICAL HISTORY: Versed 3 mg, Fentanyl 50 mcg.  Additional Medications: None.  ANESTHESIA/SEDATION: Moderate sedation time: 11 minutes  CONTRAST:  None  PROCEDURE: The procedure, risks, benefits, and alternatives were explained to the patient. Questions regarding the procedure were encouraged and answered. The patient understands and consents to the procedure.  The right gluteal region in the prone position was prepped with Betadine in a sterile fashion, and a sterile drape was applied covering the operative field. A sterile gown and sterile gloves were used for the procedure.  Under CT guidance, an 18 gauge needle was inserted into the pelvic abscess via right trans gluteal approach and removed over an Amplatz. A 12 French dilator followed by a 12 [place] drain were inserted. It was looped and string fixed and sewn to the skin. Cloudy sanguinous fluid was aspirated.  FINDINGS: Images document drain placement into a right pelvic abscess via right trans gluteal approach.  COMPLICATIONS: None  IMPRESSION: Successful right pelvic trans gluteal 12 French abscess drain.   Electronically Signed   By: Marybelle Killings M.D.   On:  02/19/2015 15:51    ASSESSMENT/PLAN:  Physical deconditioning - for rehabilitation Perforated sigmoid diverticulitis with intra-abdominal abscess S/P exploratory laparotomy and drainage of intra-abdominal abscesses, salpingo-oophorectomy, descending colostomy, sigmoid colectomy, cystoscopy - continue wound treatment as ordered; oxycodone 5 mg 1-3 tabs by mouth every 4 hours when necessary for pain; follow-up with Dr. Zella Richer, general surgeon, on 03/19/15 Anemia, acute blood loss - S/P transfusion packed RBCs, will monitor Diabetes mellitus, type II - hemoglobin A1c 6.2; requested to discontinue metformin for now Hyperlipidemia - requested for fenofibrate to be discontinued Hyperthyroidism - TSH 0.016; requested for Possible to be discontinued for now Leukocytosis - 11.9; trending down; will monitor Protein calorie malnutrition - albumin 2.2; RD consult   Goals of  care:  Short-term rehabilitation  Labs/test ordered:  CBC, BMP  Spent 50 minutes in patient care.    [name], NP [employer] [phone]

## 2015-03-11 ENCOUNTER — Non-Acute Institutional Stay (SKILLED_NURSING_FACILITY): Payer: BLUE CROSS/BLUE SHIELD | Admitting: Internal Medicine

## 2015-03-11 DIAGNOSIS — K631 Perforation of intestine (nontraumatic): Secondary | ICD-10-CM

## 2015-03-11 DIAGNOSIS — R002 Palpitations: Secondary | ICD-10-CM | POA: Diagnosis not present

## 2015-03-11 DIAGNOSIS — E119 Type 2 diabetes mellitus without complications: Secondary | ICD-10-CM

## 2015-03-11 DIAGNOSIS — D62 Acute posthemorrhagic anemia: Secondary | ICD-10-CM | POA: Diagnosis not present

## 2015-03-11 DIAGNOSIS — E46 Unspecified protein-calorie malnutrition: Secondary | ICD-10-CM | POA: Diagnosis not present

## 2015-03-11 DIAGNOSIS — R5381 Other malaise: Secondary | ICD-10-CM

## 2015-03-11 DIAGNOSIS — B3731 Acute candidiasis of vulva and vagina: Secondary | ICD-10-CM

## 2015-03-11 DIAGNOSIS — B373 Candidiasis of vulva and vagina: Secondary | ICD-10-CM

## 2015-03-11 NOTE — Progress Notes (Signed)
Patient ID: Alison Best, female   DOB: 1956-09-23, 59 y.o.   MRN: [id number]     [facility]   PCP: Laurey Morale, MD  Code Status: full code  Allergies  Allergen Reactions  . Levaquin [Levofloxacin] Other (See Comments)    Achilles tendon weakness    Chief Complaint  Patient presents with  . New Admit To SNF     HPI:   59 year old patient is here for short term rehabilitation post hospital admission from 02/18/15-03/06/15 with perforated sigmoid diverticulitis with intra-abdominal abscess. She underwent emergency exploratory laprotomy and drainage of the abscess, salpingo oopherectomy and descending colostomy. She had acute blood loss anemia and required prbc transfusion. She was on iv antibiotics and later also had enterococcus UTI. She was put on TPN for nutrition and once she was able to tolerate po, diet was advanced and TPN was stopped. She has PMH of recurrent diverticulitis, OSA, asthma, gerd, allergic rhinitis, HLD. She is seen in her room today. She complaints of being tired. Her energy level is low, appetite is slowly returning. She has concerns regarding insurance coverage for her stay in facility. She also has vaginal itching with white discharge.  Review of Systems:  Constitutional: Negative for fever, chills, diaphoresis. positive for fatigue. HENT: Negative for headache, congestion, nasal discharge, sore throat, difficulty swallowing.   Eyes: Negative for eye pain, blurred vision, double vision and discharge.  Respiratory: Negative for cough, shortness of breath and wheezing.   Cardiovascular: Negative for chest pain, leg swelling. has intermittent racing of her heart. Gastrointestinal: Negative for heartburn, nausea, vomiting, abdominal pain. Had a bowel movement yesterday but a small one. Had a good bowel movement on Sunday.  Genitourinary: Negative for dysuria and flank pain.  Musculoskeletal: Negative for back pain,  falls Skin: Negative for itching, rash.  Neurological: Negative for dizziness, tingling, focal weakness. Positive for generalized weakness. Psychiatric/Behavioral: Negative for depression, insomnia and memory loss. Feels overwhelmed but mentions her mood is good and she is coping up well. Gets anxious at times but does not want any anxiolytics for now.   Past Medical History  Diagnosis Date  . Diverticulitis of colon   . Obesity   . Hyperlipidemia   . Sleep apnea   . Heart murmur   . Irregular heartbeat   . Thyroid disease     hyperthyroid  . Bursitis   . Ectopic pregnancy    Past Surgical History  Procedure Laterality Date  . Colonoscopy  09-10-10    per Dr. Acquanetta Sit, diverticulosis of descending colon and sigmoid, internal hemorrhoids, repeat in 10 yrs  . Cesarean section    . Appendectomy    . Tonsillectomy  1980  . Tonsillectomy    . Laparotomy N/A 02/20/2015    Procedure: Emergency EXPLORATORY and drainiage of interabdominal abcesses;  Surgeon: Jackolyn Confer, MD;  Location: [facility];  Service: General;  Laterality: N/A;  . Salpingoophorectomy Left 02/20/2015    Procedure: SALPINGO OOPHORECTOMY;  Surgeon: Jackolyn Confer, MD;  Location: [facility];  Service: General;  Laterality: Left;  . Colostomy N/A 02/20/2015    Procedure: COLOSTOMY;  Surgeon: Jackolyn Confer, MD;  Location: [facility];  Service: General;  Laterality: N/A;  . Colostomy revision  02/20/2015    Procedure: Sigmoid Colectomy;  Surgeon: Jackolyn Confer, MD;  Location: [facility];  Service: General;;  . Cystoscopy N/A 02/20/2015    Procedure: CYSTOSCOPY;  Surgeon: Kathie Rhodes, MD;  Location: [facility];  Service: Urology;  Laterality: N/A;   Social History:   reports that she quit smoking about 4 weeks ago. Her smoking use included Cigarettes. She smoked 0.50 packs per day. She has never used smokeless tobacco. She reports that she drinks alcohol. She reports that she does not use illicit drugs.  Family History  Problem  Relation Age of Onset  . Cancer Mother   . COPD Other   . Allergies Brother   . Heart disease Father   . Cancer Maternal Grandmother   . Skin cancer Father     Medications: Patient's Medications  New Prescriptions   No medications on file  Previous Medications   ACETAMINOPHEN (TYLENOL) 325 MG TABLET    Take 2 tablets (650 mg total) by mouth every 6 (six) hours as needed for moderate pain or headache.   ALBUTEROL (PROAIR HFA) 108 (90 BASE) MCG/ACT INHALER    Inhale 2 puffs into the lungs every 4 (four) hours as needed for wheezing.   CELECOXIB (CELEBREX) 200 MG CAPSULE    Take 200 mg by mouth daily.   EPINEPHRINE 0.3 MG/0.3 ML IJ SOAJ INJECTION    Inject 0.3 mLs (0.3 mg total) into the muscle once.   FENOFIBRATE 160 MG TABLET    Take 1 tablet (160 mg total) by mouth daily.   FUROSEMIDE (LASIX) 20 MG TABLET    TAKE 1 TABLET BY MOUTH DAILY AS NEEDED (FLUID)   METFORMIN (GLUCOPHAGE) 500 MG TABLET    Take 1 tablet (500 mg total) by mouth 2 (two) times daily with a meal.   METHIMAZOLE (TAPAZOLE) 5 MG TABLET    TAKE 1 TABLET BY MOUTH DAILY   OXYCODONE (OXY IR/ROXICODONE) 5 MG IMMEDIATE RELEASE TABLET    Take 1-3 tablets (5-15 mg total) by mouth every 4 (four) hours as needed (mild 5mg , moderate 10mg , severe 15mg ).  Modified Medications   No medications on file  Discontinued Medications   No medications on file     Physical Exam:  Filed Vitals:   03/11/15 2023  BP: 119/60  Pulse: 94  Temp: 97 F (36.1 C)  Resp: 18  Weight: 174 lb 12.8 oz (79.289 kg)  SpO2: 94%    General- adult female, in no acute distress Head- normocephalic, atraumatic Nose- normal nasal mucosa, no maxillary or frontal sinus tenderness, no nasal discharge Throat- moist mucus membrane, normal oropharynx Eyes- PERRLA, EOMI, no pallor, no icterus, no discharge, normal conjunctiva, normal sclera Neck- no cervical lymphadenopathy, no thyromegaly, no jugular vein distension, no carotid bruit Cardiovascular-  normal s1,s2, no murmurs, palpable dorsalis pedis and radial pulses, no leg edema Respiratory- bilateral clear to auscultation, no wheeze, no rhonchi, no crackles, no use of accessory muscles Abdomen- bowel sounds present, soft, non tender, has LLQ colostomy bag Musculoskeletal- able to move all 4 extremities, generalized weakness  Neurological- no focal deficit Skin- warm and dry, surgical incision has 2 sutures at bottom, incision healing well Psychiatry- alert and oriented to person, place and time, normal mood and affect    Labs reviewed: Basic Metabolic Panel:  Recent Labs  02/27/15 0530  03/01/15 0700 03/02/15 0610 03/03/15 0600 03/05/15 0904  NA 135  < > 139 141 135 140  K 3.9  < > 4.0 4.2 3.9 3.5  CL 100*  < > 109 108 105 105  CO2 31  < > 26 27 25 24   GLUCOSE 114*  < > 116* 101* 100* 81  BUN 10  < > 17 19 13 8   CREATININE 0.33*  < >  0.37* 0.37* 0.36* 0.32*  CALCIUM 7.9*  < > 8.4* 8.5* 8.2* 8.4*  MG 1.8  --  2.0  --  1.7  --   PHOS 4.2  --  3.7  --  4.3  --   < > = values in this interval not displayed. Liver Function Tests:  Recent Labs  02/26/15 0600 02/27/15 0530 03/03/15 0600  AST 13* 14* 14*  ALT 13* 11* 12*  ALKPHOS 68 62 76  BILITOT 0.6 0.5 0.8  PROT 4.5* 4.5* 5.3*  ALBUMIN 1.8* 1.7* 2.2*    Recent Labs  02/18/15 1354  LIPASE 26   No results for input(s): AMMONIA in the last 8760 hours. CBC:  Recent Labs  02/26/15 0600  03/03/15 0600 03/04/15 0427 03/05/15 0515  WBC 16.1*  < > 12.1* 13.1* 11.9*  NEUTROABS 12.4*  --  9.1* 9.8*  --   HGB 7.4*  < > 8.6* 8.5* 8.7*  HCT 23.1*  < > 26.2* 25.6* 26.4*  MCV 84.3  < > 87.0 86.5 86.6  PLT 302  < > 347 365 381  < > = values in this interval not displayed.  CBG:  Recent Labs  03/05/15 1610 03/05/15 2139 03/06/15 0759  GLUCAP 85 114* 101*    Lab Results  Component Value Date   TSH 0.016* 02/25/2015   Lab Results  Component Value Date   HGBA1C 6.2* 02/25/2015     Assessment/Plan  Physical deconditioning Will have her work with physical therapy and occupational therapy team to help with gait training and muscle strengthening exercises.fall precautions. Skin care. Encourage to be out of bed.   Perforated sigmoid diverticulitis S/P exploratory laparotomy and drainage of intra-abdominal abscesses, salpingo-oophorectomy, descending colostomy, sigmoid colectomy. Continue incision care. Has f/u with surgery 03/19/15. continue oxycodone 5 mg 1-3 tabs q4h prn for pain.   Acute blood loss anemia Post operative, s/p prbc transfusion. Monitor h&h  Protein calorie malnutrition Low albumin. Will have her take protein supplements. Monitor po intake and weight. Monitor for pressure ulcers  Palpitations Sinus tachycardia noted on ekg from 02/20/15. Since patient feels the heart skipping a beat, get repeat ekg for palpitation. Also has hx of hyperthyroidism and was on tapozole which has been discontinued per pt request. Her anemia could also be contributing some. Check thyroid panel and if s/o hyperthyroidism, restart tapazole  Candida vaginitis Start diflucan 150 mg po daily x 3 days and reassess. Monitor cbg  Diabetes mellitus type 2 hemoglobin A1c 6.2. Metformin 500 mg bid has been discontinued per pt request. Monitor cbg   Goals of care: short term rehabilitation   Labs/tests ordered: cbc, tsh  Family/ staff Communication: reviewed care plan with patient and nursing supervisor. Have spoken with social worker to help address patient's concerns    Blanchie Serve, MD  [facility] [phone] (Monday-Friday 8 am - 5 pm) [phone] (afterhours)

## 2015-03-17 ENCOUNTER — Ambulatory Visit: Payer: BC Managed Care – PPO | Admitting: Internal Medicine

## 2015-03-19 ENCOUNTER — Non-Acute Institutional Stay: Payer: BLUE CROSS/BLUE SHIELD | Admitting: Adult Health

## 2015-03-19 ENCOUNTER — Encounter: Payer: Self-pay | Admitting: Adult Health

## 2015-03-19 DIAGNOSIS — E43 Unspecified severe protein-calorie malnutrition: Secondary | ICD-10-CM | POA: Diagnosis not present

## 2015-03-19 DIAGNOSIS — E46 Unspecified protein-calorie malnutrition: Secondary | ICD-10-CM

## 2015-03-19 DIAGNOSIS — R5381 Other malaise: Secondary | ICD-10-CM | POA: Diagnosis not present

## 2015-03-19 DIAGNOSIS — E785 Hyperlipidemia, unspecified: Secondary | ICD-10-CM | POA: Diagnosis not present

## 2015-03-19 DIAGNOSIS — E119 Type 2 diabetes mellitus without complications: Secondary | ICD-10-CM

## 2015-03-19 DIAGNOSIS — D62 Acute posthemorrhagic anemia: Secondary | ICD-10-CM | POA: Diagnosis not present

## 2015-03-19 DIAGNOSIS — T814XXS Infection following a procedure, sequela: Secondary | ICD-10-CM | POA: Diagnosis not present

## 2015-03-19 DIAGNOSIS — E0590 Thyrotoxicosis, unspecified without thyrotoxic crisis or storm: Secondary | ICD-10-CM | POA: Diagnosis not present

## 2015-03-19 DIAGNOSIS — K631 Perforation of intestine (nontraumatic): Secondary | ICD-10-CM

## 2015-03-19 DIAGNOSIS — IMO0001 Reserved for inherently not codable concepts without codable children: Secondary | ICD-10-CM

## 2015-03-19 NOTE — Progress Notes (Signed)
Patient ID: Alison Best, female   DOB: Aug 12, 1956, 59 y.o.   MRN: [id number]    03/19/15  Facility:  Nursing Home Location:  [place] Room Number: 908-P LEVEL OF CARE:  SNF (31)    Chief Complaint  Patient presents with  . Discharge Note    Physical deconditioning, perforation of sigmoid colon, anemia, hyperthyroidism, protein calorie malnutrition, diabetes mellitus, hyperlipidemia, constipation and incisional infection    HISTORY OF PRESENT ILLNESS:   This is a 59 year old female who is for discharge home with [facility] for wound care, CNA for showers, PT for endurance and OT for ADLs. DME: bedside commode and standard walker. She has been admitted to [facility] on 03/06/15 from [facility]. She was experiencing lower abdominal discomfort towards and a ribbonlike stools while in a cruise ship. The ship doctor gave her Flagyl and amoxicillin but could not keep down. When she arrived in the [name], she was having increasing pain and has fever. She presented to the ED for further evaluation. CT scan showed findings consistent with acute sigmoid diverticulitis with 2 separate abscesses, one next to the sigmoid colon and one in the pelvis area. She was placed on IV antibiotics. IR placed transgluteal drain. Emergency exploratory laparotomy and drainage of intra-abdominal abscesses, salpingo-oophorectomy, descending colon and semi-, cystoscopy was done on 02/20/15. She was given vitamin K and fresh frozen plasma preoperatively for elevated INR. Postoperatively, she was started on TPN for for prolonged ileus and malnutrition. She was given packed RBCs for hemoglobin of 6.6. Diet was advanced and TPN stopped as ostomy functioned.  Latest hgb 8.7 so she was started on FeSO4 325 mg BID. She was complaining of palpitations and thyroid were checked. Latest tsh 0.019, T3, total 295.8 and free T4 1.85. Tapazole was discontinued per patient request but will  re-start. She will consult with her endocrinologist when discharged. She was started on Cefuroxime due to drainage and malodorous smell of abdominal incision.  Patient was admitted to this facility for short-term rehabilitation after the patient's recent hospitalization.  Patient has completed SNF rehabilitation and therapy has cleared the patient for discharge.  PAST MEDICAL HISTORY:  Past Medical History  Diagnosis Date  . Diverticulitis of colon   . Obesity   . Hyperlipidemia   . Sleep apnea   . Heart murmur   . Irregular heartbeat   . Thyroid disease     hyperthyroid  . Bursitis   . Ectopic pregnancy     CURRENT MEDICATIONS: Reviewed per MAR/see medication list  Allergies  Allergen Reactions  . Levaquin [Levofloxacin] Other (See Comments)    Achilles tendon weakness     REVIEW OF SYSTEMS:  GENERAL: no change in appetite, no fatigue, no weight changes, no fever, chills or weakness RESPIRATORY: no cough, SOB, DOE, wheezing, hemoptysis CARDIAC: no chest pain, edema or palpitations GI: no abdominal pain, diarrhea,  heart burn, nausea or vomiting  PHYSICAL EXAMINATION  GENERAL: no acute distress, normal body habitus SKIN:  Midline abdominal open wound with packing, no redness NECK: supple, trachea midline, no neck masses, no thyroid tenderness, no thyromegaly LYMPHATICS: no LAN in the neck, no supraclavicular LAN RESPIRATORY: breathing is even & unlabored, BS CTAB CARDIAC: RRR, no murmur,no extra heart sounds, no edema GI: abdomen soft, normal BS, no masses, no tenderness, no hepatomegaly, no splenomegaly; LLQ colostomy with yellowish stool EXTREMITIES: Able to move 4 extremities PSYCHIATRIC: the patient is alert & oriented to person, affect &  behavior appropriate  LABS/RADIOLOGY: Labs reviewed: 03/12/15  EKG shows normal sinus rhythm TSH 0.019 T3 total 295.83 T4 1 0.85 03/11/15  WBC 7.3 hemoglobin 8.7 hematocrit 26.6 MCV 83.9 sodium 143 potassium 4.1 glucose 85 BUN 5  creatinine <0.30 calcium 8.3 Basic Metabolic Panel:  Recent Labs  02/27/15 0530  03/01/15 0700 03/02/15 0610 03/03/15 0600 03/05/15 0904  NA 135  < > 139 141 135 140  K 3.9  < > 4.0 4.2 3.9 3.5  CL 100*  < > 109 108 105 105  CO2 31  < > 26 27 25 24   GLUCOSE 114*  < > 116* 101* 100* 81  BUN 10  < > 17 19 13 8   CREATININE 0.33*  < > 0.37* 0.37* 0.36* 0.32*  CALCIUM 7.9*  < > 8.4* 8.5* 8.2* 8.4*  MG 1.8  --  2.0  --  1.7  --   PHOS 4.2  --  3.7  --  4.3  --   < > = values in this interval not displayed. Liver Function Tests:  Recent Labs  02/26/15 0600 02/27/15 0530 03/03/15 0600  AST 13* 14* 14*  ALT 13* 11* 12*  ALKPHOS 68 62 76  BILITOT 0.6 0.5 0.8  PROT 4.5* 4.5* 5.3*  ALBUMIN 1.8* 1.7* 2.2*    Recent Labs  02/18/15 1354  LIPASE 26    Recent Labs  02/26/15 0600  03/03/15 0600 03/04/15 0427 03/05/15 0515  WBC 16.1*  < > 12.1* 13.1* 11.9*  NEUTROABS 12.4*  --  9.1* 9.8*  --   HGB 7.4*  < > 8.6* 8.5* 8.7*  HCT 23.1*  < > 26.2* 25.6* 26.4*  MCV 84.3  < > 87.0 86.5 86.6  PLT 302  < > 347 365 381  < > = values in this interval not displayed.  Lipid Panel:  Recent Labs  05/23/14 0933  HDL 31.60*   CBG:  Recent Labs  03/05/15 1610 03/05/15 2139 03/06/15 0759  GLUCAP 85 114* 101*     Ct Abdomen Pelvis W Contrast  03/05/2015   CLINICAL DATA:  Left pelvic pain, pelvic drain removed 3-4 days ago  EXAM: CT ABDOMEN AND PELVIS WITH CONTRAST  TECHNIQUE: Multidetector CT imaging of the abdomen and pelvis was performed using the standard protocol following bolus administration of intravenous contrast.  CONTRAST:  179mL OMNIPAQUE IOHEXOL 300 MG/ML  SOLN  COMPARISON:  02/25/2015  FINDINGS: Lower chest: Small right and trace left pleural effusions. Associated bilateral lower lobe atelectasis.  Hepatobiliary: Liver is within normal limits.  Gallbladder is unremarkable. No intrahepatic or extrahepatic ductal dilatation.  Pancreas: Within normal limits.  Spleen:  Within normal limits.  Adrenals/Urinary Tract: Adrenal glands are within normal limits.  Kidneys within normal limits.  No hydronephrosis.  Bladder is underdistended.  Stomach/Bowel: Stomach is within normal limits.  No evidence of bowel obstruction.  Prior appendectomy.  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  Vascular/Lymphatic: No evidence of abdominal aortic aneurysm.  Small upper abdominal lymph nodes, including a 11 mm short axis portacaval node (series 2/ image 30), likely reactive. Small retroperitoneal/ pelvic lymph nodes which do not meet pathologic CT size criteria.  Reproductive: Uterus is within normal limits.  No adnexal masses.  Other: 3.3 x 4.1 cm ill-defined gas and fluid collection anterior to the left psoas (series 2/ image 65), previously 2.2 x 3.2 cm, although without well-defined rim.  Layering fluid in the left pelvis measuring 2.4 x 2.7  cm (series 2/image 70), again without well-defined rim.  Minimal presacral fluid (series 2/image 31), unchanged.  Prior pelvic drain has been removed.  Postsurgical changes in the midline anterior abdominal wall.  Musculoskeletal: Mild degenerative changes of the visualized thoracolumbar spine.  IMPRESSION: Prior pelvic drain has been removed.  3.3 x 4.1 cm ill-defined gas and fluid collection anterior to the left psoas muscle, mildly increased, although without a well-defined rim.  Additional layering fluid in the left pelvis.   Electronically Signed   By: Julian Hy M.D.   On: 03/05/2015 15:35   Ct Abdomen Pelvis W Contrast  02/25/2015   CLINICAL DATA:  Patient with prior exploratory laparotomy and drainage of intra-abdominal abscess with sigmoid colectomy and left salpingo-oophorectomy.  EXAM: CT ABDOMEN AND PELVIS WITH CONTRAST  TECHNIQUE: Multidetector CT imaging of the abdomen and pelvis was performed using the standard protocol following bolus administration of intravenous contrast.  CONTRAST:   134mL OMNIPAQUE IOHEXOL 300 MG/ML  SOLN  COMPARISON:  CT 02/18/2015  FINDINGS: Lower chest: Small right-greater-than-left pleural effusions with underlying right and left lower lobe consolidation suggestive of atelectasis. Normal heart size. Enteric tube terminates in the stomach.  Hepatobiliary: Liver is normal in size and contour without focal hepatic lesion identified. Gallbladder is unremarkable. No intrahepatic or extrahepatic biliary ductal dilatation.  Pancreas: Unremarkable  Spleen: Unremarkable  Adrenals/Urinary Tract: The adrenal glands are normal. The kidneys enhance symmetrically with contrast. No hydronephrosis. There is mild hyperenhancement of the urothelium of the mid to distal left ureter near the pelvic inflammatory process. Gas within the urinary bladder likely secondary to prior instrumentation.  Stomach/Bowel: Patient status post sigmoid colectomy. Left lower quadrant colostomy is present. No abnormal bowel dilatation to suggest bowel obstruction.  Vascular/Lymphatic: Normal caliber abdominal aorta. No retroperitoneal or pelvic lymphadenopathy. Multiple sub cm pelvic lymph nodes likely reactive in etiology.  Other: Postsurgical changes within the midline abdominal wall compatible with exploratory laparotomy. Pelvic drain is present coursing from the right lower quadrant anterior abdominal wall soft tissues and terminating within the left lower quadrant. Within the left lower quadrant anterior to the external iliac vessels there is a 3.2 x 3.1 x 2.2 cm fluid and gas containing collection without definable wall (image 65; series 2). Additionally there is mild rim enhancement demonstrated about fluid within the pelvis. Uterus is grossly unremarkable. Right ovary is grossly unremarkable.  Musculoskeletal: No aggressive or acute appearing osseous lesions. Lower lumbar spine degenerative changes.  IMPRESSION: Fluid and gas collection within the left lower quadrant may represent  postoperative/phlegmonous change however early abscess formation is not excluded. The pelvic surgical drain appears to coarse adjacent to this collection. There is an additional small amount of fluid within the pelvis anterior to the uterus and superior to the bladder. Minimal surrounding enhancement raises the possibility of peritonitis.  Small bilateral pleural effusions and underlying pulmonary consolidation, suggestive of atelectasis.  Postsurgical changes within the midline of the abdomen.  Left lower quadrant colostomy.  These results will be called to the ordering clinician or representative by the Radiologist Assistant, and communication documented in the PACS or zVision Dashboard.   Electronically Signed   By: Lovey Newcomer M.D.   On: 02/25/2015 14:55   Ct Abdomen Pelvis W Contrast  02/18/2015   CLINICAL DATA:  Abdominal pain with fever and diarrhea  EXAM: CT ABDOMEN AND PELVIS WITH CONTRAST  TECHNIQUE: Multidetector CT imaging of the abdomen and pelvis was performed using the standard protocol following bolus administration of intravenous contrast. Oral  contrast was also administered.  CONTRAST:  121mL OMNIPAQUE IOHEXOL 300 MG/ML  SOLN  COMPARISON:  None.  FINDINGS: Lung bases are clear.  Liver is prominent, measuring 16.8 cm in length. No focal liver lesions are identified. Gallbladder wall is not thickened. There is no biliary duct dilatation.  Spleen, pancreas, adrenals appear normal. Kidneys bilaterally show no mass or hydronephrosis on either side. There is no renal or ureteral calculus on either side.  In the pelvis, the urinary bladder is midline with normal wall thickness. There is irregular wall thickening in the mid to distal sigmoid colon with a complex fluid collection measuring 4.5 x 3.3 x 2.3 cm consistent with a developing diverticular abscess. There are several nearby tiny foci of air suggesting microperforation. There is a loculated fluid collection in the posterior pelvis separate from  of the apparent diverticular abscess measuring 4.2 x 2.7 x 2.6 cm. This fluid may represent complex ascites but also may represent an early second abscess. This fluid collection is near but separate from the upper rectum. There is no well-defined mass in the pelvis. Appendix is absent.  There is no bowel obstruction. No free air apart from the microperforation in the sigmoid colon region or portal venous air. There is no adenopathy in the abdomen or pelvis. There is no abdominal aortic aneurysm. There are no blastic or lytic bone lesions.  IMPRESSION: Evidence of diverticulitis with developing diverticular abscess in the mid to distal sigmoid colon with apparent minimal micro- perforation. Question loculated ascites versus developing second abscess in the dependent portion of the pelvis posteriorly.  No bowel obstruction. Appendix absent. Liver prominent without focal liver lesion.  Critical Value/emergent results were called by telephone at the time of interpretation on 02/18/2015 at 4:03 pm to Dr. Lacretia Leigh , who verbally acknowledged these results.   Electronically Signed   By: Lowella Grip III M.D.   On: 02/18/2015 16:03   Dg Abd Portable 1v  02/20/2015   CLINICAL DATA:  Lower abdominal pain  EXAM: PORTABLE ABDOMEN - 1 VIEW  COMPARISON:  CT abdomen and pelvis February 18, 2015  FINDINGS: A drain is noted in the pelvis from right-sided approach. There is no bowel dilatation or air-fluid level suggesting obstruction. No free air is seen. No abnormal calcifications.  IMPRESSION: Bowel gas pattern unremarkable.  Drain in pelvis noted.   Electronically Signed   By: Lowella Grip III M.D.   On: 02/20/2015 09:53   Ct Image Guided Drainage Percut Cath  Peritoneal Retroperit  02/19/2015   CLINICAL DATA:  Pelvic abscess  EXAM: CT CORE BIOPSY RENAL  FLUOROSCOPY TIME:  None  MEDICATIONS AND MEDICAL HISTORY: Versed 3 mg, Fentanyl 50 mcg.  Additional Medications: None.  ANESTHESIA/SEDATION: Moderate sedation  time: 11 minutes  CONTRAST:  None  PROCEDURE: The procedure, risks, benefits, and alternatives were explained to the patient. Questions regarding the procedure were encouraged and answered. The patient understands and consents to the procedure.  The right gluteal region in the prone position was prepped with Betadine in a sterile fashion, and a sterile drape was applied covering the operative field. A sterile gown and sterile gloves were used for the procedure.  Under CT guidance, an 18 gauge needle was inserted into the pelvic abscess via right trans gluteal approach and removed over an Amplatz. A 12 French dilator followed by a 12 [place] drain were inserted. It was looped and string fixed and sewn to the skin. Cloudy sanguinous fluid was aspirated.  FINDINGS: Images document  drain placement into a right pelvic abscess via right trans gluteal approach.  COMPLICATIONS: None  IMPRESSION: Successful right pelvic trans gluteal 12 French abscess drain.   Electronically Signed   By: Marybelle Killings M.D.   On: 02/19/2015 15:51    ASSESSMENT/PLAN:  Physical deconditioning - for Home health PT, OT, Nursing and CNA Perforated sigmoid diverticulitis with intra-abdominal abscess S/P exploratory laparotomy and drainage of intra-abdominal abscesses, salpingo-oophorectomy, descending colostomy, sigmoid colectomy, cystoscopy - continue wound treatment as ordered; oxycodone 5 mg 1-3 tabs by mouth every 4 hours when necessary for pain; follow-up with Dr. Zella Richer, general surgeon Anemia, acute blood loss - S/P transfusion packed RBCs, hgb 8.7; continue ferrous sulfate 325 mg 1 tab by mouth twice a day Diabetes mellitus, type II - hemoglobin A1c 6.2; requested to discontinue metformin for now Hyperlipidemia - requested for fenofibrate to be discontinued Hyperthyroidism - TSH 0.019; re-start Tapazole  5 mg PO Q D Protein calorie malnutrition - albumin 2.2; continue supplementation Incisional infection - continue cefuroxime  500 mg 1 by mouth every 12 hours 7 more days Constipation - continue senna S2 tabs by mouth twice a day     I have filled out patient's discharge paperwork and written prescriptions.  Patient will receive home health PT, OT, Nursing and CNA.  DME provided: Bedside commode and standard walker  Total discharge time: Greater than 30 minutes  Discharge time involved coordination of the discharge process with social worker, nursing staff and therapy department. Medical justification for home health services/DME verified.     [facility], NP [employer] [phone]

## 2015-03-27 ENCOUNTER — Telehealth: Payer: Self-pay | Admitting: Family Medicine

## 2015-03-27 NOTE — Telephone Encounter (Signed)
I spoke with pt and she will schedule a mammogram.

## 2015-04-10 DIAGNOSIS — Z48815 Encounter for surgical aftercare following surgery on the digestive system: Secondary | ICD-10-CM | POA: Diagnosis not present

## 2015-04-26 ENCOUNTER — Other Ambulatory Visit: Payer: Self-pay | Admitting: Adult Health

## 2015-04-30 ENCOUNTER — Encounter: Payer: Self-pay | Admitting: Podiatry

## 2015-04-30 ENCOUNTER — Ambulatory Visit (INDEPENDENT_AMBULATORY_CARE_PROVIDER_SITE_OTHER): Payer: BLUE CROSS/BLUE SHIELD | Admitting: Podiatry

## 2015-04-30 VITALS — BP 130/72 | HR 76 | Resp 15

## 2015-04-30 DIAGNOSIS — L600 Ingrowing nail: Secondary | ICD-10-CM

## 2015-04-30 MED ORDER — CEPHALEXIN 500 MG PO CAPS: 500.0000 mg | ORAL_CAPSULE | Freq: Three times a day (TID) | ORAL | Status: DC

## 2015-04-30 NOTE — Progress Notes (Signed)
   Subjective:    Patient ID: Alison Best, female    DOB: [birth year], 59 y.o.   MRN: [id number]  HPI  Pt presents with right great medial border, ingrown, red painful with drainage  Review of Systems  Cardiovascular: Positive for leg swelling.  All other systems reviewed and are negative.      Objective:   Physical Exam        Assessment & Plan:

## 2015-04-30 NOTE — Patient Instructions (Signed)

## 2015-05-02 NOTE — Progress Notes (Signed)
Subjective:     Patient ID: Alison Best, female   DOB: [birth year], 59 y.o.   MRN: [id number]  HPI patient presents stating I have a painful ingrown toenail my right big toe that I tried to trim and soak without relief   Review of Systems  All other systems reviewed and are negative.      Objective:   Physical Exam  Constitutional: She is oriented to person, place, and time.  Cardiovascular: Intact distal pulses.   Musculoskeletal: Normal range of motion.  Neurological: She is oriented to person, place, and time.  Skin: Skin is warm.  Nursing note and vitals reviewed.  neurovascular status intact muscle strength adequate range of motion within normal limits with incurvated right hallux medial border with pain when pressed of the localized nature. No distal drainage is noted with mild redness of the localized nature and irritation noted     Assessment:     Ingrown toenail deformity right hallux medial border with pain and no indication of infection    Plan:     H&P and condition reviewed with patient. I've recommended surgical correction of the corner and I explained procedure and risk and patient wants this performed. I infiltrated the right hallux 60 mg Xylocaine Marcaine mixture remove the medial border exposed matrix and applied phenol 3 applications 30 seconds followed by alcohol lavage and sterile dressings. Gave instructions on soaks and reappoint

## 2015-05-20 ENCOUNTER — Ambulatory Visit (INDEPENDENT_AMBULATORY_CARE_PROVIDER_SITE_OTHER): Payer: BLUE CROSS/BLUE SHIELD | Admitting: Podiatry

## 2015-05-20 ENCOUNTER — Encounter: Payer: Self-pay | Admitting: Podiatry

## 2015-05-20 VITALS — BP 112/56 | HR 76 | Resp 18

## 2015-05-20 DIAGNOSIS — L600 Ingrowing nail: Secondary | ICD-10-CM | POA: Diagnosis not present

## 2015-05-20 DIAGNOSIS — L03032 Cellulitis of left toe: Secondary | ICD-10-CM

## 2015-05-20 NOTE — Progress Notes (Signed)
   Subjective:    Patient ID: CENIYAH THORP, female    DOB: 04-06-1956, 59 y.o.   MRN: [id number]  HPI THE RIGHT BIG TOE IS BETTER AND THE LEFT HAS SOME DRAINING AND RED AND HAS USED ANTIBIOTIC OINTMENT AND I HAVE SOAKED IT This patient presents to the office with painful inside border left big toe.  She says the nail has become red and swollen and is painful walking and wearing her shoes. She had ingrown right toe corrected by Dr. Paulla Dolly .  No evidence of drainage from the nail.   Review of Systems     Objective:   Physical Exam GENERAL APPEARANCE: Alert, conversant. Appropriately groomed. No acute distress.  VASCULAR: Pedal pulses palpable at 2/4 DP and PT bilateral.  Capillary refill time is immediate to all digits,  Proximal to distal cooling it warm to warm.  Digital hair growth is present bilateral  NEUROLOGIC: sensation is intact epicritically and protectively to 5.07 monofilament at 5/5 sites bilateral.  Light touch is intact bilateral, vibratory sensation intact bilateral, achilles tendon reflex is intact bilateral.  MUSCULOSKELETAL: acceptable muscle strength, tone and stability bilateral.  Intrinsic muscluature intact bilateral.  Rectus appearance of foot and digits noted bilateral.   DERMATOLOGIC: skin color, texture, and turgor are within normal limits.  No preulcerative lesions or ulcers  are seen, no interdigital maceration noted.  No open lesions present.  . No drainage noted. NAILS  Red swollen painful medial border left great toe.       Assessment & Plan:  Ingrowing toenail left hallux  Paronychia lateral border left hallux.  ROV  Nail surgery left hallux. Treatment options and alternatives discussed.  Recommended permanent phenol matrixectomy and patient agreed.  Left hallux  was prepped with alcohol and a toe block of 3cc of 2% lidocaine plain was administered in a .  The toe was then prepped with betadine solution .  The offending nail border was then excised and  matrix tissue exposed.  Phenol was then applied to the matrix tissue followed by an alcohol wash.  Antibiotic ointment and a dry sterile dressing was applied.  The patient was dispensed instructions for aftercare.RTC 1 week.  No antibiotics ordered.

## 2015-05-21 ENCOUNTER — Encounter: Payer: Self-pay | Admitting: Family Medicine

## 2015-05-21 ENCOUNTER — Ambulatory Visit (INDEPENDENT_AMBULATORY_CARE_PROVIDER_SITE_OTHER): Payer: BLUE CROSS/BLUE SHIELD | Admitting: Family Medicine

## 2015-05-21 VITALS — BP 97/68 | HR 86 | Ht 61.0 in | Wt 150.0 lb

## 2015-05-21 DIAGNOSIS — J019 Acute sinusitis, unspecified: Secondary | ICD-10-CM

## 2015-05-21 DIAGNOSIS — R000 Tachycardia, unspecified: Secondary | ICD-10-CM | POA: Diagnosis not present

## 2015-05-21 MED ORDER — AZITHROMYCIN 250 MG PO TABS: ORAL_TABLET | ORAL | Status: DC

## 2015-05-21 NOTE — Progress Notes (Signed)
   Subjective:    Patient ID: Alison Best, female    DOB: Apr 19, 1956, 59 y.o.   MRN: [id number]  HPI Here for 2 things. First she has had sinus pressure, blowing green mucus out of the nose, and a mild HA for the past week. No fever or ST or cough. Also she has had frequent episodes of heart racing over the past few months. Sometimes she feels it skipping or pounding. This may last a few seconds or a few minutes at a time. No chest pain or SOB. She had a few EKGs in the hospital recently as a result of having colon surgery for divereticulitis, and these have shown sinus tachycardia with some global T wave inversions. It is not clear if this was due to a repolarization defect or to ischemia.    Review of Systems  Constitutional: Negative.   HENT: Positive for congestion, postnasal drip and sinus pressure.   Eyes: Negative.   Respiratory: Positive for cough. Negative for shortness of breath.   Cardiovascular: Positive for palpitations. Negative for chest pain and leg swelling.       Objective:   Physical Exam  Constitutional: She appears well-developed and well-nourished.  HENT:  Right Ear: External ear normal.  Left Ear: External ear normal.  Nose: Nose normal.  Mouth/Throat: Oropharynx is clear and moist.  Eyes: Conjunctivae are normal.  Neck: No thyromegaly present.  Cardiovascular: Normal rate, regular rhythm, normal heart sounds and intact distal pulses.   Pulmonary/Chest: Effort normal and breath sounds normal.  Lymphadenopathy:    She has no cervical adenopathy.          Assessment & Plan:  For the sinusitis, treat with a Zpack. For the tachycardia, we will refer her to Cardiology.

## 2015-05-21 NOTE — Progress Notes (Signed)
Pre visit review using our clinic review tool, if applicable. No additional management support is needed unless otherwise documented below in the visit note. 

## 2015-05-22 ENCOUNTER — Ambulatory Visit (INDEPENDENT_AMBULATORY_CARE_PROVIDER_SITE_OTHER): Payer: BLUE CROSS/BLUE SHIELD | Admitting: Cardiovascular Disease

## 2015-05-22 ENCOUNTER — Encounter: Payer: Self-pay | Admitting: Cardiovascular Disease

## 2015-05-22 VITALS — BP 104/64 | HR 85 | Ht 60.5 in | Wt 149.8 lb

## 2015-05-22 DIAGNOSIS — R002 Palpitations: Secondary | ICD-10-CM | POA: Diagnosis not present

## 2015-05-22 DIAGNOSIS — R011 Cardiac murmur, unspecified: Secondary | ICD-10-CM | POA: Diagnosis not present

## 2015-05-22 DIAGNOSIS — R072 Precordial pain: Secondary | ICD-10-CM | POA: Diagnosis not present

## 2015-05-22 DIAGNOSIS — R9431 Abnormal electrocardiogram [ECG] [EKG]: Secondary | ICD-10-CM

## 2015-05-22 NOTE — Patient Instructions (Signed)
Medication Instructions:  Your physician recommends that you continue on your current medications as directed. Please refer to the Current Medication list given to you today.   Labwork: none  Testing/Procedures: Your physician has recommended that you wear a holter monitor. Holter monitors are medical devices that record the heart's electrical activity. Doctors most often use these monitors to diagnose arrhythmias. Arrhythmias are problems with the speed or rhythm of the heartbeat. The monitor is a small, portable device. You can wear one while you do your normal daily activities. This is usually used to diagnose what is causing palpitations/syncope (passing out).  Your physician has requested that you have an exercise stress myoview. For further information please visit [email or web address]. Please follow instruction sheet, as given.  Your physician has requested that you have an echocardiogram. Echocardiography is a painless test that uses sound waves to create images of your heart. It provides your doctor with information about the size and shape of your heart and how well your heart's chambers and valves are working. This procedure takes approximately one hour. There are no restrictions for this procedure.    Follow-Up:  Your physician recommends that you schedule a follow-up appointment in:  About 6 weeks. Scheduled for Sept 9, 2016 at 9:00    .

## 2015-05-22 NOTE — Progress Notes (Signed)
Chief Complaint  Patient presents with  . 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 She was hospitalized at [facility] May 2016 with perforated sigmoid diverticulitis with intra-abdominal abscesses and underwent emergency exploratory laparotomy and drainage of intra-abdominal abscesses, salpingo oophorectomy, descending colostomy, sigmoid colectomy, cystoscopy. During her hospitalization her EKG showed T wave inversions and ST depression. She is referred today for further evaluation. She has no known cardiac disease.   She tells me today that she has had one recent episode of chest pain at night while at rest. No exertional chest pain. She also describes her heart racing and "irregularity" of her heart rhythm. This happens when she is under stress. She has noticed this mostly at night. No near syncope or syncope.   Primary Care Physician: Sarajane Jews   Past Medical History  Diagnosis Date  . Diverticulitis of colon   . Obesity   . Hyperlipidemia   . Sleep apnea   . Heart murmur   . Irregular heartbeat   . Thyroid disease     hyperthyroid  . Bursitis   . Ectopic pregnancy     Past Surgical History  Procedure Laterality Date  . Colonoscopy  09-10-10    per Dr. Acquanetta Sit, diverticulosis of descending colon and sigmoid, internal hemorrhoids, repeat in 10 yrs  . Cesarean section    . Appendectomy    . Tonsillectomy  1980  . Tonsillectomy    . Laparotomy N/A 02/20/2015    Procedure: Emergency EXPLORATORY and drainiage of interabdominal abcesses;  Surgeon: Jackolyn Confer, MD;  Location: [facility];  Service: General;  Laterality: N/A;  . Salpingoophorectomy Left 02/20/2015    Procedure: SALPINGO OOPHORECTOMY;  Surgeon: Jackolyn Confer, MD;  Location: [facility];  Service: General;  Laterality: Left;  . Colostomy  N/A 02/20/2015    Procedure: COLOSTOMY;  Surgeon: Jackolyn Confer, MD;  Location: [facility];  Service: General;  Laterality: N/A;  . Colostomy revision  02/20/2015    Procedure: Sigmoid Colectomy;  Surgeon: Jackolyn Confer, MD;  Location: [facility];  Service: General;;  . Cystoscopy N/A 02/20/2015    Procedure: CYSTOSCOPY;  Surgeon: Kathie Rhodes, MD;  Location: [facility];  Service: Urology;  Laterality: N/A;    Current Outpatient Prescriptions  Medication Sig Dispense Refill  . acetaminophen (TYLENOL) 325 MG tablet Take 2 tablets (650 mg total) by mouth every 6 (six) hours as needed for moderate pain or headache.    . albuterol (PROAIR HFA) 108 (90 BASE) MCG/ACT inhaler Inhale 2 puffs into the lungs every 4 (four) hours as needed for wheezing. 1 Inhaler 11  . azithromycin (ZITHROMAX) 250 MG tablet As directed 6 tablet 0  . EPINEPHrine 0.3 mg/0.3 mL IJ SOAJ injection Inject 0.3 mLs (0.3 mg total) into the muscle once. 1 Device 11  . furosemide (LASIX) 20 MG tablet TAKE 1 TABLET BY MOUTH DAILY AS NEEDED (FLUID) 30 tablet 11   No current facility-administered medications for this visit.    Allergies  Allergen Reactions  . Levaquin [Levofloxacin] Other (See Comments)    Achilles tendon weakness    History   Social History  . Marital Status: Married    Spouse Name: N/A  . Number of Children: 1  . Years of Education: N/A   Occupational History  . Equities trader at [employer]    Social History Main Topics  . Smoking status: Former Smoker -- 0.25 packs/day for 40 years    Types: Cigarettes    Quit date: 02/07/2015  . Smokeless tobacco: Never Used     Comment: less than 1/2 pack a day  . Alcohol Use: 0.0 oz/week    0 Standard drinks or equivalent per week     Comment: occ  . Drug Use: No  . Sexual Activity: Yes   Other Topics Concern  . Not on file   Social History Narrative    Family History  Problem Relation Age of Onset  . Cancer Mother   . COPD Other   .  Allergies Brother   . CAD Father     CABG  . Cancer Maternal Grandmother   . Skin cancer Father     Review of Systems:  As stated in the HPI and otherwise negative.   BP 104/64 mmHg  Pulse 85  Ht 5' 0.5" (1.537 m)  Wt 149 lb 12.8 oz (67.949 kg)  BMI 28.76 kg/m2  LMP 02/23/2012  Physical Examination: General: Well developed, well nourished, NAD HEENT: OP clear, mucus membranes moist SKIN: warm, dry. No rashes. Neuro: No focal deficits Musculoskeletal: Muscle strength 5/5 all ext Psychiatric: Mood and affect normal Neck: No JVD, no carotid bruits, no thyromegaly, no lymphadenopathy. Lungs:Clear bilaterally, no wheezes, rhonci, crackles Cardiovascular: Regular rate and rhythm. Slight systolic murmur. No gallops or rubs. Abdomen:Soft. Bowel sounds present. Non-tender.  Extremities: No lower extremity edema. Pulses are 2 + in the bilateral DP/PT.  EKG:  EKG is ordered today. The ekg ordered today demonstrates NSR rate 85 bpm.   Recent Labs: 02/25/2015: TSH 0.016* 03/03/2015: ALT 12*; Magnesium 1.7 03/05/2015: BUN 8; Creatinine, Ser 0.32*; Hemoglobin 8.7*; Platelets 381; Potassium 3.5; Sodium 140   Lipid Panel    Component Value Date/Time   CHOL 215* 05/23/2014 0933   TRIG 111 03/03/2015 0600   HDL 31.60* 05/23/2014 0933   CHOLHDL 7 05/23/2014 0933   VLDL 56.8* 05/23/2014 0933   LDLCALC 93 11/02/2011 0947   LDLDIRECT 80.0 05/23/2014 0933     Wt Readings from Last 3 Encounters:  05/22/15 149 lb 12.8 oz (67.949 kg)  05/21/15 150 lb (68.04 kg)  03/19/15 172 lb (78.019 kg)     Other studies Reviewed: Additional studies/ records that were reviewed today include: . Review of the above records demonstrates:    Assessment and Plan:   1. Chest pain: She did have an abnormal EKG while under metabolic stress in may 2016 with anemia. No exertional pain but h/o smoking, FH of CAD and personal history of HLD will arrange exercise stress myoview to exclude ischemia. Will arrange  echo to assess LVEF.   2. Palpitations: 48 hour monitor  3. Cardiac murmur: Will arrange echo to assess.   Current medicines are reviewed at length with the patient today.  The patient does not have concerns regarding medicines.  The following changes have been made:  no change  Labs/ tests ordered today include:   Orders Placed This Encounter  Procedures  . Holter monitor - 48 hour  . Myocardial Perfusion Imaging  . EKG 12-Lead  . Echocardiogram    Disposition:   FU with me in 6 weeks  Signed, Lauree Chandler, MD 05/22/2015 1:27 PM    [facility] [place], [place],   [address] Phone: [phone]; Fax: [phone]

## 2015-05-26 ENCOUNTER — Telehealth (HOSPITAL_COMMUNITY): Payer: Self-pay

## 2015-05-26 NOTE — Telephone Encounter (Signed)
Patient given detailed instructions per Myocardial Perfusion Study Information Sheet for test on 05-28-2015 at 0715. Patient Notified to arrive 15 minutes early, and that it is imperative to arrive on time for appointment to keep from having the test rescheduled. Patient verbalized understanding. Per Loma Newton, CNMT/ Ir[facility] A

## 2015-05-26 NOTE — Telephone Encounter (Signed)
Encounter complete. 

## 2015-05-27 ENCOUNTER — Ambulatory Visit (INDEPENDENT_AMBULATORY_CARE_PROVIDER_SITE_OTHER): Payer: BLUE CROSS/BLUE SHIELD | Admitting: Podiatry

## 2015-05-27 VITALS — BP 103/64 | HR 64 | Resp 12

## 2015-05-27 DIAGNOSIS — Z09 Encounter for follow-up examination after completed treatment for conditions other than malignant neoplasm: Secondary | ICD-10-CM

## 2015-05-27 NOTE — Progress Notes (Signed)
Subjective:     Patient ID: Alison Best, female   DOB: 1956-10-01, 59 y.o.   MRN: [id number]  HPIThis patient presents to the office following nail surgery left hallux.  She says there is drainage and sensitivity but no pain  She has been soaking and bandaging the toe.  She presents for evaluation and treatment.   Review of Systems     Objective:   Physical Exam GENERAL APPEARANCE: Alert, conversant. Appropriately groomed. No acute distress.  VASCULAR: Pedal pulses palpable at 2/4 DP and PT bilateral.  Capillary refill time is immediate to all digits,  Proximal to distal cooling it warm to warm.  Digital hair growth is present bilateral  NEUROLOGIC: sensation is intact epicritically and protectively to 5.07 monofilament at 5/5 sites bilateral.  Light touch is intact bilateral, vibratory sensation intact bilateral, achilles tendon reflex is intact bilateral.  MUSCULOSKELETAL: acceptable muscle strength, tone and stability bilateral.  Intrinsic muscluature intact bilateral.  Rectus appearance of foot and digits noted bilateral.   DERMATOLOGIC: skin color, texture, and turgor are within normal limits.  No preulcerative lesions or ulcers  are seen, no interdigital maceration noted.  No open lesions present.   NAILS  There is drainage with minimal necrotic tissue lateral border left great toe.  No infection noted.      Assessment:     S/p  Nail surgery     Plan:  ROV  Debride necrotic tissue.  Betadine /DSD  Home instructions given orally.

## 2015-05-28 ENCOUNTER — Ambulatory Visit (HOSPITAL_COMMUNITY): Payer: BLUE CROSS/BLUE SHIELD | Attending: Cardiology

## 2015-05-28 DIAGNOSIS — R9431 Abnormal electrocardiogram [ECG] [EKG]: Secondary | ICD-10-CM | POA: Diagnosis not present

## 2015-05-28 DIAGNOSIS — R011 Cardiac murmur, unspecified: Secondary | ICD-10-CM | POA: Diagnosis not present

## 2015-05-28 DIAGNOSIS — R072 Precordial pain: Secondary | ICD-10-CM | POA: Diagnosis not present

## 2015-05-28 LAB — MYOCARDIAL PERFUSION IMAGING
CHL CUP MPHR: 163 {beats}/min
CHL CUP NUCLEAR SDS: 6
CHL CUP NUCLEAR SSS: 7
CHL RATE OF PERCEIVED EXERTION: 15
CSEPPHR: 155 {beats}/min
Estimated workload: 4.6 METS
Exercise duration (min): 3 min
Exercise duration (sec): 12 s
LV dias vol: 86 mL
LV sys vol: 19 mL
Percent HR: 1 %
RATE: 0.44
Rest HR: 91 {beats}/min
SRS: 1
TID: 0.97

## 2015-05-28 MED ORDER — TECHNETIUM TC 99M SESTAMIBI GENERIC - CARDIOLITE
10.7000 | Freq: Once | INTRAVENOUS | Status: AC | PRN
  Administered 2015-05-28: 11 via INTRAVENOUS

## 2015-05-28 MED ORDER — TECHNETIUM TC 99M SESTAMIBI GENERIC - CARDIOLITE
32.1000 | Freq: Once | INTRAVENOUS | Status: AC | PRN
  Administered 2015-05-28: 32.1 via INTRAVENOUS

## 2015-06-03 ENCOUNTER — Ambulatory Visit (HOSPITAL_COMMUNITY): Payer: BLUE CROSS/BLUE SHIELD | Attending: Cardiovascular Disease

## 2015-06-03 ENCOUNTER — Ambulatory Visit (INDEPENDENT_AMBULATORY_CARE_PROVIDER_SITE_OTHER): Payer: BLUE CROSS/BLUE SHIELD | Admitting: *Deleted

## 2015-06-03 ENCOUNTER — Other Ambulatory Visit: Payer: Self-pay

## 2015-06-03 ENCOUNTER — Ambulatory Visit (INDEPENDENT_AMBULATORY_CARE_PROVIDER_SITE_OTHER): Payer: BLUE CROSS/BLUE SHIELD

## 2015-06-03 VITALS — BP 126/70 | HR 131 | Ht 60.5 in

## 2015-06-03 DIAGNOSIS — I071 Rheumatic tricuspid insufficiency: Secondary | ICD-10-CM | POA: Diagnosis not present

## 2015-06-03 DIAGNOSIS — E669 Obesity, unspecified: Secondary | ICD-10-CM | POA: Diagnosis not present

## 2015-06-03 DIAGNOSIS — E785 Hyperlipidemia, unspecified: Secondary | ICD-10-CM | POA: Insufficient documentation

## 2015-06-03 DIAGNOSIS — R002 Palpitations: Secondary | ICD-10-CM

## 2015-06-03 DIAGNOSIS — I517 Cardiomegaly: Secondary | ICD-10-CM | POA: Diagnosis not present

## 2015-06-03 DIAGNOSIS — Z87891 Personal history of nicotine dependence: Secondary | ICD-10-CM | POA: Diagnosis not present

## 2015-06-03 DIAGNOSIS — R011 Cardiac murmur, unspecified: Secondary | ICD-10-CM | POA: Diagnosis not present

## 2015-06-03 DIAGNOSIS — Z6829 Body mass index (BMI) 29.0-29.9, adult: Secondary | ICD-10-CM | POA: Diagnosis not present

## 2015-06-03 DIAGNOSIS — R072 Precordial pain: Secondary | ICD-10-CM | POA: Diagnosis not present

## 2015-06-03 DIAGNOSIS — I4891 Unspecified atrial fibrillation: Secondary | ICD-10-CM | POA: Diagnosis not present

## 2015-06-03 DIAGNOSIS — R9431 Abnormal electrocardiogram [ECG] [EKG]: Secondary | ICD-10-CM | POA: Diagnosis not present

## 2015-06-03 DIAGNOSIS — R079 Chest pain, unspecified: Secondary | ICD-10-CM | POA: Diagnosis present

## 2015-06-03 LAB — BASIC METABOLIC PANEL
BUN: 13 mg/dL (ref 6–23)
CHLORIDE: 109 meq/L (ref 96–112)
CO2: 26 meq/L (ref 19–32)
Calcium: 9.7 mg/dL (ref 8.4–10.5)
Creatinine, Ser: 0.37 mg/dL — ABNORMAL LOW (ref 0.40–1.20)
GFR: 190.78 mL/min (ref >60.00)
Glucose, Bld: 93 mg/dL (ref 70–99)
Potassium: 3.9 mEq/L (ref 3.5–5.1)
Sodium: 142 mEq/L (ref 135–145)

## 2015-06-03 LAB — CBC WITH DIFFERENTIAL/PLATELET
BASOS PCT: 0.2 % (ref 0.0–3.0)
Basophils Absolute: 0 10*3/uL (ref 0.0–0.1)
EOS ABS: 0.1 10*3/uL (ref 0.0–0.7)
Eosinophils Relative: 1.4 % (ref 0.0–5.0)
HEMATOCRIT: 35.5 % — AB (ref 36.0–46.0)
HEMOGLOBIN: 11.7 g/dL — AB (ref 12.0–15.0)
Lymphocytes Relative: 27.9 % (ref 12.0–46.0)
Lymphs Abs: 1.9 10*3/uL (ref 0.7–4.0)
MCHC: 33 g/dL (ref 30.0–36.0)
MCV: 84.6 fl (ref 78.0–100.0)
MONO ABS: 0.6 10*3/uL (ref 0.1–1.0)
Monocytes Relative: 8 % (ref 3.0–12.0)
Neutro Abs: 4.3 10*3/uL (ref 1.4–7.7)
Neutrophils Relative %: 62.5 % (ref 43.0–77.0)
PLATELETS: 255 10*3/uL (ref 150.0–400.0)
RBC: 4.19 Mil/uL (ref 3.87–5.11)
RDW: 16.8 % — ABNORMAL HIGH (ref 11.5–15.5)
WBC: 6.9 10*3/uL (ref 4.0–10.5)

## 2015-06-03 LAB — TSH: TSH: 0.08 u[IU]/mL — AB (ref 0.35–4.50)

## 2015-06-03 MED ORDER — METOPROLOL SUCCINATE ER 50 MG PO TB24: 50.0000 mg | ORAL_TABLET | Freq: Every day | ORAL | Status: DC

## 2015-06-03 NOTE — Progress Notes (Signed)
Spoke with pt and she agreed to come in on 06/12/15 at 9:45AM.  Also went over echo results with pt. Will route to Dr. Angelena Form to make him aware.

## 2015-06-03 NOTE — Progress Notes (Signed)
I could see her at 10am tomorrow if she can do that or if she can't do that then I could see her next Thursday the 18th. Also see echo. Thanks, Gerald Stabs

## 2015-06-03 NOTE — Progress Notes (Signed)
1.) Reason for visit: In office for echo and 48 hour monitor  2.) Name of MD requesting visit: McAlhany   3.)  H&P: Patient stated she has been having problems with palpitations, lightheaded, and heart racing for a while but seems to be more frequent since her surgery in May. She had abdominal surgery in May 2016. Patient was in the office for         a 2 D Echo and a 48 hour monitor. While Shelly was placing monitor she noticed she was in AFib, no previous history. EKG done AFib with RVR HR 131. Blood pressure 126/70. Patients states she if felling ok now but did have some               lightheadedness going from echo room to monitor room.   4.)  Assessment and plan per MD: Reviewed with Dr Linard Millers, DOD and will have patient start Toprol XL 50 mg once daily and Aspirin 81 mg once daily, labs (cbc/bmet/tsh) today. Continue to wear monitor and will have Dr Angelena Form review.       At that time he will decide if follow up needed before 07/04/15 scheduled follow up visit. Advised patient and agreeable to plan

## 2015-06-03 NOTE — Patient Instructions (Addendum)
Medication Instructions:  START TOPROL XL (METOPROLOL) 50 MG DAILY  START ASPIRIN 81 MG DAILY  Labwork: CBC/BMET/TSH  Testing/Procedures: NONE  Follow-Up: Will call you after your monitor results to let you know if you need to be seen before your scheduled appointment   Monitor your blood pressure at home, if your top number gets below 100 call the office

## 2015-06-04 ENCOUNTER — Telehealth: Payer: Self-pay

## 2015-06-04 NOTE — Telephone Encounter (Signed)
Spoke with pt and informed her of lab results. Informed pt that Dr. Angelena Form would like pt seen sooner if possible. Scheduled pt with Rosaria Ferries, PA-C on 06/06/15. Informed pt that Dr. Angelena Form would also like for her to f/u with PCP asap for thyroid. Pt requested that I send labs to PCP and Dr. Renne Crigler, whom she has seen in the past for her thyroid issues. Will route these labs to them for review and pt will contact their offices for appts. Pt verbalized understanding and was in agreement with this plan.

## 2015-06-04 NOTE — Telephone Encounter (Signed)
Pt can be offered 8/18 at 10am with Dr. Angelena Form (if not already taken) if so pt can see APP within the next week for anti-coag. Pt needs to see PCP for Thyroid as soon as possible.  Notes Recorded by Newt Minion, RN on 06/04/2015 at 11:02 AM Left message to call back.   Notes Recorded by Burnell Blanks, MD on 06/04/2015 at 10:51 AM She has new atrial fibrillation, noted yesterday when she showed up for echo and monitor. See phone notes. She has hyperthyroidism and needs to get into primary care this week to have further workup of this. She also needs f/u in our office to discuss her atrial fib. She has been put down for next week on Thursday with me. Is there anything with a PA or NP in our office before then? Gerald Stabs

## 2015-06-05 NOTE — Telephone Encounter (Signed)
Thanks

## 2015-06-06 ENCOUNTER — Telehealth: Payer: Self-pay | Admitting: Internal Medicine

## 2015-06-06 ENCOUNTER — Encounter: Payer: Self-pay | Admitting: Physician Assistant

## 2015-06-06 ENCOUNTER — Ambulatory Visit (INDEPENDENT_AMBULATORY_CARE_PROVIDER_SITE_OTHER): Payer: BLUE CROSS/BLUE SHIELD | Admitting: Physician Assistant

## 2015-06-06 VITALS — BP 100/60 | HR 84 | Ht 60.6 in | Wt 159.0 lb

## 2015-06-06 DIAGNOSIS — I480 Paroxysmal atrial fibrillation: Secondary | ICD-10-CM

## 2015-06-06 DIAGNOSIS — R0989 Other specified symptoms and signs involving the circulatory and respiratory systems: Secondary | ICD-10-CM

## 2015-06-06 MED ORDER — METHIMAZOLE 5 MG PO TABS: 5.0000 mg | ORAL_TABLET | Freq: Two times a day (BID) | ORAL | Status: DC

## 2015-06-06 NOTE — Progress Notes (Signed)
Cardiology Office Note   Date:  06/06/2015   ID:  Alison Best, DOB May 23, 1956, MRN [id number]  PCP:  Laurey Morale, MD  Cardiologist:  Dr Asencion Islam, PA-C   Chief Complaint  Patient presents with  . Irregular Heart Beat    History of Present Illness: Alison Best is a 59 y.o. female with a history of hyperlipidemia, sleep apnea, hyperthryoidism and former tobacco abuse (1/4 ppd for 40 years), who was evaluated by Dr Angelena Form 04/2015 for abnormal ECG, possible ischemic changes. These were in the setting of prolonged hospitalization from perforated diverticulitis, and temporary colostomy. She eventually recovered.   Alison Best presents for evaluation of palpitations.   She has a long history of palpitations. They last varying amounts of time. They are not associated with chest pain, presyncope or significant SOB. She was on telemetry during her hospitalization and brief episodes of NSVT were seen as well as SVT vs PAT. She wore a Holter monitor and when she came in to get it put on, she was in rapid atrial fibrillation. She has turned the monitor in, but does not have results yet.   Her TSH is low and she contacted her physician. She is being started on methimazole. She also had an echo.    Past Medical History  Diagnosis Date  . Diverticulitis of colon 02/2015  . Obesity   . Hyperlipidemia   . Sleep apnea   . Heart murmur   . Irregular heartbeat   . Thyroid disease     hyperthyroid  . Bursitis   . Ectopic pregnancy     Past Surgical History  Procedure Laterality Date  . Colonoscopy  09-10-10    per Dr. Acquanetta Sit, diverticulosis of descending colon and sigmoid, internal hemorrhoids, repeat in 10 yrs  . Cesarean section    . Appendectomy    . Tonsillectomy  1980  . Tonsillectomy    . Laparotomy N/A 02/20/2015    Procedure: Emergency EXPLORATORY and drainiage of interabdominal abcesses;  Surgeon: Jackolyn Confer, MD;  Location: [facility];   Service: General;  Laterality: N/A;  . Salpingoophorectomy Left 02/20/2015    Procedure: SALPINGO OOPHORECTOMY;  Surgeon: Jackolyn Confer, MD;  Location: [facility];  Service: General;  Laterality: Left;  . Colostomy N/A 02/20/2015    Procedure: COLOSTOMY;  Surgeon: Jackolyn Confer, MD;  Location: [facility];  Service: General;  Laterality: N/A;  . Colostomy revision  02/20/2015    Procedure: Sigmoid Colectomy;  Surgeon: Jackolyn Confer, MD;  Location: [facility];  Service: General;;  . Cystoscopy N/A 02/20/2015    Procedure: CYSTOSCOPY;  Surgeon: Kathie Rhodes, MD;  Location: [facility];  Service: Urology;  Laterality: N/A;    Current Outpatient Prescriptions  Medication Sig Dispense Refill  . acetaminophen (TYLENOL) 325 MG tablet Take 2 tablets (650 mg total) by mouth every 6 (six) hours as needed for moderate pain or headache.    . albuterol (PROAIR HFA) 108 (90 BASE) MCG/ACT inhaler Inhale 2 puffs into the lungs every 4 (four) hours as needed for wheezing. 1 Inhaler 11  . aspirin 81 MG tablet Take 81 mg by mouth daily.    Marland Kitchen EPINEPHrine 0.3 mg/0.3 mL IJ SOAJ injection Inject 0.3 mLs (0.3 mg total) into the muscle once. 1 Device 11  . furosemide (LASIX) 20 MG tablet TAKE 1 TABLET BY MOUTH DAILY AS NEEDED (FLUID) 30 tablet 11  . methimazole (TAPAZOLE) 5 MG tablet Take 1 tablet (5 mg total) by  mouth 2 (two) times daily. 60 tablet 1  . metoprolol succinate (TOPROL-XL) 50 MG 24 hr tablet Take 1 tablet (50 mg total) by mouth daily. Take with or immediately following a meal. 30 tablet 1   No current facility-administered medications for this visit.    Allergies:   Levaquin    Social History:  The patient  reports that she quit smoking about 3 months ago. Her smoking use included Cigarettes. She has a 10 pack-year smoking history. She has never used smokeless tobacco. She reports that she drinks alcohol. She reports that she does not use illicit drugs.   Family History:  The patient's family history includes  Allergies in her brother; CAD in her father; COPD in her other; Cancer in her maternal grandmother and mother; Heart attack in her maternal grandfather and paternal grandfather; Skin cancer in her father. There is no history of Stroke or Hypertension.    ROS:  Please see the history of present illness. All other systems are reviewed and negative.    PHYSICAL EXAM: VS:  BP 100/60 mmHg  Pulse 84  Ht 5' 0.6" (1.539 m)  Wt 159 lb (72.122 kg)  BMI 30.45 kg/m2  LMP 02/23/2012 , BMI Body mass index is 30.45 kg/(m^2). GEN: Well nourished, well developed, in no acute distress HEENT: normal Neck: no JVD, + bilateral carotid bruits, no masses Cardiac: RRR; no murmurs, rubs, or gallops,no edema  Respiratory:  clear to auscultation bilaterally, normal work of breathing GI: soft, nontender, nondistended, + BS MS: no deformity or atrophy Skin: warm and dry, no rash Neuro:  Strength and sensation are intact Psych: euthymic mood, full affect   EKG:  EKG is ordered today. The ekg ordered today demonstrates SR, rate 84, no acute changes, LAE   ECHO:  06/03/2015 - Left ventricle: The cavity size was normal. Wall thickness was normal. Systolic function was normal. The estimated ejection fraction was in the range of 60% to 65%. Wall motion was normal; there were no regional wall motion abnormalities. - Left atrium: The atrium was moderately dilated. - Tricuspid valve: There was mild-moderate regurgitation. - Pulmonary arteries: Systolic pressure was moderately increased. PA peak pressure: 51 mm Hg (S).  Recent Labs: 03/03/2015: ALT 12*; Magnesium 1.7 06/03/2015: BUN 13; Creatinine, Ser 0.37*; Hemoglobin 11.7*; Platelets 255.0; Potassium 3.9; Sodium 142; TSH 0.08*    Lipid Panel    Component Value Date/Time   CHOL 215* 05/23/2014 0933   TRIG 111 03/03/2015 0600   HDL 31.60* 05/23/2014 0933   CHOLHDL 7 05/23/2014 0933   VLDL 56.8* 05/23/2014 0933   LDLCALC 93 11/02/2011 0947    LDLDIRECT 80.0 05/23/2014 0933     Wt Readings from Last 3 Encounters:  06/06/15 159 lb (72.122 kg)  05/28/15 149 lb (67.586 kg)  05/22/15 149 lb 12.8 oz (67.949 kg)     Other studies Reviewed: Additional studies/ records that were reviewed today include: previous notes, ECGs, echo results..  ASSESSMENT AND PLAN:  1.  Arrhythmia: she had SVT vs PAT in the setting of acute illness. She also had some brief runs of NSVT. In the office, she was in atrial fibrillation. Her symptoms have improved since being started on Toprol XL. Her BP is borderline, so no dose change. Her EF is normal by echo, but there is some L atrial enlargement. Continue to follow symptoms on the BB.  2. Hyperthyroid - she is being started on Methimazole. Management per Endocrinology. May need to decrease BB as treatment progresses.  3. Anticoagulation: This patients CHA2DS2-VASc Score and unadjusted Ischemic Stroke Rate (% per year) is equal to 0.6 % stroke rate/year from a score of 1 Above score calculated as 1 point each if present [CHF, HTN, DM, Vascular=MI/PAD/Aortic Plaque, Age if 65-74, or Female], 2 points each if present [Age > 75, or Stroke/TIA/TE]. She is currently on baby ASA, continue this.  4. Carotid bruits: Pt has bilateral carotid bruits, may be radiation of murmur from MR, also has AoV mean gradient of 9 mm Hg without stenosis. Will obtain carotid dopplers.   Current medicines are reviewed at length with the patient today.  The patient does not have concerns regarding medicines.  The following changes have been made:  no change  Labs/ tests ordered today include:   Orders Placed This Encounter  Procedures  . EKG 12-Lead     Disposition:   FU with Dr Angelena Form as scheduled.  Augusto Garbe  06/06/2015 4:49 PM    [facility] [place], [place], [place]  [address] Phone: [phone]; Fax: [phone]

## 2015-06-06 NOTE — Telephone Encounter (Signed)
Patient is returning your call.  

## 2015-06-06 NOTE — Patient Instructions (Signed)
Medication Instructions:   Your physician recommends that you continue on your current medications as directed. Please refer to the Current Medication list given to you today.    Labwork:  NONE ORDER TODAY   Testing/Procedures:  Your physician has requested that you have a carotid duplex. This test is an ultrasound of the carotid arteries in your neck. It looks at blood flow through these arteries that supply the brain with blood. Allow one hour for this exam. There are no restrictions or special instructions.    Follow-UP  ALREADY SCHEDULED NOTED AT THE TOP OF OFFICE VISIT    Any Other Special Instructions Will Be Listed Below (If Applicable).

## 2015-06-06 NOTE — Telephone Encounter (Signed)
Spoke with pt. She has not been on MMI. Advised pt to start MMI 5 mg bid. Return in 5-6 wks for labs. (Pt was at another dr's appt). Pt voiced understanding.

## 2015-06-12 ENCOUNTER — Telehealth: Payer: Self-pay | Admitting: Cardiovascular Disease

## 2015-06-12 ENCOUNTER — Ambulatory Visit: Payer: BLUE CROSS/BLUE SHIELD | Admitting: Cardiovascular Disease

## 2015-06-12 NOTE — Telephone Encounter (Signed)
Spoke with pt and reviewed monitor results with her.  

## 2015-06-12 NOTE — Telephone Encounter (Signed)
New message  ° ° °Patient calling back to speak with nurse  °

## 2015-06-17 ENCOUNTER — Ambulatory Visit (HOSPITAL_COMMUNITY)
Admission: RE | Admit: 2015-06-17 | Discharge: 2015-06-17 | Disposition: A | Discharge status: Home / Self Care | Payer: BLUE CROSS/BLUE SHIELD | Source: Ambulatory Visit | Attending: Physician Assistant | Admitting: Physician Assistant

## 2015-06-17 DIAGNOSIS — R0989 Other specified symptoms and signs involving the circulatory and respiratory systems: Secondary | ICD-10-CM

## 2015-06-17 DIAGNOSIS — I6523 Occlusion and stenosis of bilateral carotid arteries: Secondary | ICD-10-CM | POA: Diagnosis not present

## 2015-06-25 ENCOUNTER — Encounter: Payer: Self-pay | Admitting: Podiatry

## 2015-06-25 ENCOUNTER — Ambulatory Visit (INDEPENDENT_AMBULATORY_CARE_PROVIDER_SITE_OTHER): Payer: BLUE CROSS/BLUE SHIELD | Admitting: Podiatry

## 2015-06-25 VITALS — BP 132/72 | HR 74 | Resp 16

## 2015-06-25 DIAGNOSIS — L600 Ingrowing nail: Secondary | ICD-10-CM

## 2015-06-25 NOTE — Patient Instructions (Signed)

## 2015-06-25 NOTE — Progress Notes (Signed)
Subjective:     Patient ID: Alison Best, female   DOB: 1956/06/17, 59 y.o.   MRN: [id number]  HPI patient states my right big toe is tender in the corner and it's been inflamed. The one seems done before are doing well this is new   Review of Systems     Objective:   Physical Exam Neurovascular status intact muscle strength adequate with patient noted to have inflammation and pain in the right hallux lateral border with incurvation of the nailbed    Assessment:     Ingrown toenail deformity right hallux lateral border    Plan:     H&P and condition reviewed. I've recommended removal of the corner and I do think we'll be able to do it permanently and I do want her to stay on her Augmentin. I went ahead and explained risk and then infiltrated the right hallux 60 mg like Marcaine mixture remove the lateral border exposed matrix and applied phenol 3 applications 30 seconds followed by alcohol lavage and sterile dressing. They've instructions on soaks and reappoint

## 2015-06-26 ENCOUNTER — Telehealth: Payer: Self-pay | Admitting: *Deleted

## 2015-06-26 NOTE — Telephone Encounter (Signed)
Left message for patient at [phone] (Cell #) to check to see how they were doing from their ingrown toenail procedure that was done on Wednesday, June 25, 2015. Waiting for a response.

## 2015-07-04 ENCOUNTER — Ambulatory Visit (INDEPENDENT_AMBULATORY_CARE_PROVIDER_SITE_OTHER): Payer: BLUE CROSS/BLUE SHIELD | Admitting: Cardiovascular Disease

## 2015-07-04 ENCOUNTER — Encounter: Payer: Self-pay | Admitting: Cardiovascular Disease

## 2015-07-04 VITALS — BP 100/58 | HR 58 | Ht 60.0 in | Wt 172.4 lb

## 2015-07-04 DIAGNOSIS — I480 Paroxysmal atrial fibrillation: Secondary | ICD-10-CM

## 2015-07-04 DIAGNOSIS — R072 Precordial pain: Secondary | ICD-10-CM

## 2015-07-04 DIAGNOSIS — I779 Disorder of arteries and arterioles, unspecified: Secondary | ICD-10-CM | POA: Diagnosis not present

## 2015-07-04 DIAGNOSIS — I739 Peripheral vascular disease, unspecified: Secondary | ICD-10-CM

## 2015-07-04 NOTE — Progress Notes (Signed)
Chief Complaint  Patient presents with  . 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 She was hospitalized at [facility] May 2016 with perforated sigmoid diverticulitis with intra-abdominal abscesses and underwent emergency exploratory laparotomy and drainage of intra-abdominal abscesses, salpingo oophorectomy, descending colostomy, sigmoid colectomy, cystoscopy. During her hospitalization her EKG showed T wave inversions and ST depression. I saw her in the office in July 2016 and arranged an echo and a holter monitor. Echo with normal LV function and TR with no other valve disease. Stress myoview 05/28/15 with no ischemia. When she presented for her echo she was in rapid atrial fib. She was started on Toprol and ASA given CHADS VASC score of 1. She is being treated for hyperthyroidism. Atrial fibrillation was noted on her 48 hour monitor. Carotid dopplers August 2016 with mild bilateral carotid disease.    She is here today for follow up. No exertional chest pain or SOB. She has continued to have palpitations. No near syncope or syncope.   Primary Care Physician: Sarajane Jews   Past Medical History  Diagnosis Date  . Diverticulitis of colon 02/2015  . Obesity   . Hyperlipidemia   . Sleep apnea   . Heart murmur   . Irregular heartbeat   . Thyroid disease     hyperthyroid  . Bursitis   . Ectopic pregnancy     Past Surgical History  Procedure Laterality Date  . Colonoscopy  09-10-10    per Dr. Acquanetta Sit, diverticulosis of descending colon and sigmoid, internal hemorrhoids, repeat in 10 yrs  . Cesarean section    . Appendectomy    . Tonsillectomy  1980  . Tonsillectomy    . Laparotomy N/A 02/20/2015    Procedure: Emergency EXPLORATORY and drainiage of interabdominal abcesses;  Surgeon: Jackolyn Confer, MD;   Location: [facility];  Service: General;  Laterality: N/A;  . Salpingoophorectomy Left 02/20/2015    Procedure: SALPINGO OOPHORECTOMY;  Surgeon: Jackolyn Confer, MD;  Location: [facility];  Service: General;  Laterality: Left;  . Colostomy N/A 02/20/2015    Procedure: COLOSTOMY;  Surgeon: Jackolyn Confer, MD;  Location: [facility];  Service: General;  Laterality: N/A;  . Colostomy revision  02/20/2015    Procedure: Sigmoid Colectomy;  Surgeon: Jackolyn Confer, MD;  Location: [facility];  Service: General;;  . Cystoscopy N/A 02/20/2015    Procedure: CYSTOSCOPY;  Surgeon: Kathie Rhodes, MD;  Location: [facility];  Service: Urology;  Laterality: N/A;    Current Outpatient Prescriptions  Medication Sig Dispense Refill  . acetaminophen (TYLENOL) 325 MG tablet Take 2 tablets (650 mg total) by mouth every 6 (six) hours as needed for moderate pain or headache.    . albuterol (PROAIR HFA) 108 (90 BASE) MCG/ACT inhaler Inhale 2 puffs into the lungs every 4 (four) hours as needed for wheezing. 1 Inhaler 11  . aspirin 81 MG tablet Take 81 mg by mouth daily.    Marland Kitchen EPINEPHrine 0.3 mg/0.3 mL IJ SOAJ injection Inject 0.3 mLs (0.3 mg total) into the muscle once. 1 Device 11  . furosemide (LASIX) 20 MG tablet TAKE 1 TABLET BY MOUTH DAILY AS NEEDED (FLUID) 30 tablet 11  . methimazole (TAPAZOLE) 5 MG tablet Take 1 tablet (5 mg total) by mouth 2 (two) times daily. 60 tablet 1  . metoprolol succinate (TOPROL-XL)  50 MG 24 hr tablet Take 1 tablet (50 mg total) by mouth daily. Take with or immediately following a meal. 30 tablet 1   No current facility-administered medications for this visit.    Allergies  Allergen Reactions  . Levaquin [Levofloxacin] Other (See Comments)    Achilles tendon weakness    Social History   Social History  . Marital Status: Married    Spouse Name: N/A  . Number of Children: 1  . Years of Education: N/A   Occupational History  . Equities trader at [employer]  Topics  . Smoking status: Former Smoker -- 0.25 packs/day for 40 years    Types: Cigarettes    Quit date: 02/07/2015  . Smokeless tobacco: Never Used     Comment: less than 1/2 pack a day  . Alcohol Use: 0.0 oz/week    0 Standard drinks or equivalent per week     Comment: occ  . Drug Use: No  . Sexual Activity: Yes   Other Topics Concern  . Not on file   Social History Narrative    Family History  Problem Relation Age of Onset  . Cancer Mother   . COPD Other   . Allergies Brother   . CAD Father     CABG  . Cancer Maternal Grandmother   . Skin cancer Father   . Heart attack Maternal Grandfather   . Stroke Neg Hx   . Hypertension Neg Hx   . Heart attack Paternal Grandfather     Review of Systems:  As stated in the HPI and otherwise negative.   BP 100/58 mmHg  Pulse 58  Ht 5' (1.524 m)  Wt 172 lb 6.4 oz (78.2 kg)  BMI 33.67 kg/m2  SpO2 98%  LMP 02/23/2012  Physical Examination: General: Well developed, well nourished, NAD HEENT: OP clear, mucus membranes moist SKIN: warm, dry. No rashes. Neuro: No focal deficits Musculoskeletal: Muscle strength 5/5 all ext Psychiatric: Mood and affect normal Neck: No JVD, no carotid bruits, no thyromegaly, no lymphadenopathy. Lungs:Clear bilaterally, no wheezes, rhonci, crackles Cardiovascular: Regular rate and rhythm. Slight systolic murmur. No gallops or rubs. Abdomen:Soft. Bowel sounds present. Non-tender.  Extremities: No lower extremity edema. Pulses are 2 + in the bilateral DP/PT.  Echo 06/03/15: Left ventricle: The cavity size was normal. Wall thickness was normal. Systolic function was normal. The estimated ejection fraction was in the range of 60% to 65%. Wall motion was normal; there were no regional wall motion abnormalities. - Left atrium: The atrium was moderately dilated. - Tricuspid valve: There was mild-moderate regurgitation. - Pulmonary arteries: Systolic pressure was moderately increased. PA peak  pressure: 51 mm Hg (S).  EKG:  EKG is not ordered today. The ekg ordered today demonstrates   Recent Labs: 03/03/2015: ALT 12*; Magnesium 1.7 06/03/2015: BUN 13; Creatinine, Ser 0.37*; Hemoglobin 11.7*; Platelets 255.0; Potassium 3.9; Sodium 142; TSH 0.08*   Lipid Panel    Component Value Date/Time   CHOL 215* 05/23/2014 0933   TRIG 111 03/03/2015 0600   HDL 31.60* 05/23/2014 0933   CHOLHDL 7 05/23/2014 0933   VLDL 56.8* 05/23/2014 0933   LDLCALC 93 11/02/2011 0947   LDLDIRECT 80.0 05/23/2014 0933     Wt Readings from Last 3 Encounters:  07/04/15 172 lb 6.4 oz (78.2 kg)  06/06/15 159 lb (72.122 kg)  05/28/15 149 lb (67.586 kg)     Other studies Reviewed: Additional studies/ records that were reviewed today include: .  Review of the above records demonstrates:    Assessment and Plan:   1. Chest pain: She did have an abnormal EKG while under metabolic stress in may 2016 with anemia. No exertional pain but h/o smoking, FH of CAD and personal history of HLD. Stress myoview 05/28/15 without ischemia. Echo with normal LV function and wall motion.  No further ischemic workup. She can proceed with planned surgical procedure.   2. Paroxysmal atrial fibrillation: In setting of hyperthyroidism. CHADS VASC score of 1. Will continue ASA 81 mg daily. Will continue Toprol. Hopefully will not recur once thyroid disease is treated.   3. Carotid disease: Mild bilateral disease by dopplers August 2016.   Current medicines are reviewed at length with the patient today.  The patient does not have concerns regarding medicines.  The following changes have been made:  no change  Labs/ tests ordered today include:   No orders of the defined types were placed in this encounter.    Disposition:   FU with me in 6 weeks  Signed, Lauree Chandler, MD 07/04/2015 1:59 PM    [facility] [place], [place], [place]  [address] Phone: [phone]; Fax: [phone]

## 2015-07-04 NOTE — Patient Instructions (Signed)
Medication Instructions:  Your physician recommends that you continue on your current medications as directed. Please refer to the Current Medication list given to you today.   Labwork: none  Testing/Procedures: none  Follow-Up: Your physician wants you to follow-up in: 6 months.  You will receive a reminder letter in the mail two months in advance. If you don't receive a letter, please call our office to schedule the follow-up appointment.       

## 2015-07-07 ENCOUNTER — Other Ambulatory Visit: Payer: Self-pay

## 2015-07-07 ENCOUNTER — Other Ambulatory Visit (INDEPENDENT_AMBULATORY_CARE_PROVIDER_SITE_OTHER): Payer: BLUE CROSS/BLUE SHIELD

## 2015-07-07 ENCOUNTER — Other Ambulatory Visit: Payer: BLUE CROSS/BLUE SHIELD

## 2015-07-07 ENCOUNTER — Ambulatory Visit (INDEPENDENT_AMBULATORY_CARE_PROVIDER_SITE_OTHER)
Admission: RE | Admit: 2015-07-07 | Discharge: 2015-07-07 | Disposition: A | Discharge status: Home / Self Care | Payer: BLUE CROSS/BLUE SHIELD | Source: Ambulatory Visit | Attending: Family Medicine | Admitting: Family Medicine

## 2015-07-07 DIAGNOSIS — E785 Hyperlipidemia, unspecified: Secondary | ICD-10-CM

## 2015-07-07 DIAGNOSIS — R0602 Shortness of breath: Secondary | ICD-10-CM

## 2015-07-07 LAB — TSH: TSH: 0.04 u[IU]/mL — ABNORMAL LOW (ref 0.35–4.50)

## 2015-07-09 ENCOUNTER — Encounter: Payer: Self-pay | Admitting: Internal Medicine

## 2015-07-09 ENCOUNTER — Other Ambulatory Visit: Payer: BLUE CROSS/BLUE SHIELD

## 2015-07-11 ENCOUNTER — Other Ambulatory Visit: Payer: Self-pay | Admitting: Internal Medicine

## 2015-07-11 DIAGNOSIS — E0590 Thyrotoxicosis, unspecified without thyrotoxic crisis or storm: Secondary | ICD-10-CM

## 2015-07-11 MED ORDER — METHIMAZOLE 5 MG PO TABS: ORAL_TABLET | ORAL | Status: DC

## 2015-07-11 NOTE — Telephone Encounter (Signed)
Pt requesting updated methimazole dosage sent to [facility] at [address]

## 2015-07-15 ENCOUNTER — Encounter: Payer: Self-pay | Admitting: Cardiovascular Disease

## 2015-07-18 ENCOUNTER — Telehealth: Payer: Self-pay

## 2015-07-18 NOTE — Telephone Encounter (Signed)
Patient need to be referred to uptake and scan want it done on sept 30 th, please advise

## 2015-07-18 NOTE — Telephone Encounter (Signed)
Left pt a Vm to return call for pt appt after 07/21/2015

## 2015-07-19 ENCOUNTER — Encounter: Payer: Self-pay | Admitting: Cardiovascular Disease

## 2015-07-21 ENCOUNTER — Telehealth: Payer: Self-pay

## 2015-07-21 NOTE — Telephone Encounter (Signed)
Left pt a Vm to return call to make an appt with Dr.Gherghe.

## 2015-07-22 ENCOUNTER — Other Ambulatory Visit: Payer: Self-pay

## 2015-07-22 MED ORDER — METOPROLOL SUCCINATE ER 50 MG PO TB24: 50.0000 mg | ORAL_TABLET | Freq: Every day | ORAL | Status: DC

## 2015-07-25 ENCOUNTER — Ambulatory Visit: Payer: BLUE CROSS/BLUE SHIELD | Admitting: Internal Medicine

## 2015-07-28 ENCOUNTER — Other Ambulatory Visit: Payer: Self-pay | Admitting: Interventional Cardiology

## 2015-07-28 ENCOUNTER — Other Ambulatory Visit: Payer: Self-pay | Admitting: Internal Medicine

## 2015-07-30 ENCOUNTER — Encounter: Payer: Self-pay | Admitting: Internal Medicine

## 2015-07-30 ENCOUNTER — Ambulatory Visit (INDEPENDENT_AMBULATORY_CARE_PROVIDER_SITE_OTHER): Payer: BLUE CROSS/BLUE SHIELD | Admitting: Internal Medicine

## 2015-07-30 VITALS — BP 118/62 | HR 64 | Temp 98.5°F | Resp 12 | Wt 172.4 lb

## 2015-07-30 DIAGNOSIS — R7303 Prediabetes: Secondary | ICD-10-CM

## 2015-07-30 DIAGNOSIS — E0590 Thyrotoxicosis, unspecified without thyrotoxic crisis or storm: Secondary | ICD-10-CM | POA: Diagnosis not present

## 2015-07-30 DIAGNOSIS — E041 Nontoxic single thyroid nodule: Secondary | ICD-10-CM

## 2015-07-30 LAB — POCT GLYCOSYLATED HEMOGLOBIN (HGB A1C): HEMOGLOBIN A1C: 4.8

## 2015-07-30 NOTE — Progress Notes (Addendum)
Patient ID: Alison Best, female   DOB: [birth year], 59 y.o.   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 Last OV was 11 mo ago.  She had colon resection this summer, after an episode of diverticulitis.  Subclinical: Reviewed hx: The thyroid tests were checked as she complained of neck pressure. She still feels this occasionally (upper neck).  I reviewed pt's thyroid tests: Lab Results  Component Value Date   TSH 0.04* 07/07/2015   TSH 0.08* 06/03/2015   TSH 0.016* 02/25/2015   TSH 0.02* 09/16/2014   TSH 0.02* 06/18/2014   TSH 0.02* 05/23/2014   TSH 0.04* 04/22/2014   TSH 1.51 07/28/2012   TSH 1.68 07/05/2011   TSH 1.45 04/24/2008   FREET4 1.14 09/16/2014   FREET4 0.97 06/18/2014   FREET4 1.12 04/22/2014   Component     Latest Ref Rng 06/18/2014  TSI     <140 % baseline 111   07/10/2014 Uptake and scan: The 24 hr uptake by the thyroid gland is 28.5%. Normal 24 hr uptakeis 10-30 %. On thyroid imaging, the thyroid activity appears mildly heterogeneous in the right lobe with a possible cold nodule inferiorly. The left lobe activity appears homogeneous.  07/15/2014 Thyroid U/S: Multiple bilateral thyroid nodules. The dominant nodules bilateral meet criteria for percutaneous biopsy. >> nodules appeared poorly defined and it is not unusual for the nodules to appear while the thyroid activity is increased and resolve when the thyroid activity normalizes >> decided to repeat the Thyroid U/S in 1 year and Bx the nodules if still present.  Pt denies feeling nodules in neck, no hoarseness, no dysphagia occasionally/no odynophagia, no SOB with lying down.  At last visit, we discussed about further plan. I exposed 2 possibilities: - To biopsy the cold nodule now and then treat with radioactive iodine - To start methimazole low-dose and follow the thyroid tests and if they do not  normalize after we tapered methimazole to off, we can repeat the uptake and scan and see if there is still a need for radioactive iodine treatment She chose the second plan, therefore, I advised her to start methimazole 5 mg daily in a.m.   She mentions now that she did not start the [place] until 05/2015! In he meantime she had Afib.  As the TSH was still low in 05/2015 (I did not know she was not taking the MMI 5 mg daily >> I increased the dose to 5 mg bid >> TSH remained low in 06/2015  >> dose increased to 10 mg in a.m. and 5 mg in p.m.  I also suggested to have another thyroid uptake and scan and possibly radioactive iodine treatment. To have the tests scheduled, she had to have another appointment with me today, as imposed by her insurance. She therefore scheduled a new appt today.  She c/o: - + weight gain - no heat intolerance - no anxiety - no palpitations  - no tremors - no problems with concentration - no fatigue - no hyperdefecation/constipation   She was diagnosed with prediabetes last year:  Lab Results  Component Value Date   HGBA1C 6.2* 02/25/2015   HGBA1C 5.9 09/16/2014   HGBA1C 6.4 05/28/2014   She was on  Metformin 500 mg 2x a day >> stopped last fall.  She is not checking her sugars at home, does not have a glucometer.  ROS: Constitutional: see HPI  Eyes: no blurry vision, no xerophthalmia ENT: no sore throat, + see HPI Cardiovascular: no CP/SOB/+ palpitations/no leg swelling Respiratory: no cough/no SOB Gastrointestinal: no N/V/D/C Musculoskeletal: no muscle/joint aches Skin:no rash, + hair loss Neurological: no tremors/numbness/tingling/dizziness  I reviewed pt's medications, allergies, PMH, social hx, family hx, and changes were documented in the history of present illness. Otherwise, unchanged from my initial visit note.  PE: BP 118/62 mmHg  Pulse 64  Temp(Src) 98.5 F (36.9 C) (Oral)  Resp 12  Wt 172 lb 6.4 oz (78.2 kg)  SpO2 96%  LMP  02/23/2012  Wt Readings from Last 3 Encounters:  07/30/15 172 lb 6.4 oz (78.2 kg)  07/04/15 172 lb 6.4 oz (78.2 kg)  06/06/15 159 lb (72.122 kg)   Constitutional: overweight, in NAD Eyes: PERRLA, EOMI, no exophthalmos, no lid lag, no stare ENT: moist mucous membranes, no thyromegaly, no thyroid bruits, no cervical lymphadenopathy Cardiovascular: RRR, No MRG Respiratory: CTA B Gastrointestinal: abdomen soft, NT, ND, BS+ Musculoskeletal: no deformities, strength intact in all 4 Skin: moist, warm, no rashes Neurological: no tremor with outstretched hands, DTR normal in all 4  ASSESSMENT: 1. Thyrotoxycosis  07/10/2014: Uptake and scan: 24 hr uptake by the thyroid gland is 28.5%. Normal 24 hr uptake is 10-30 %. On thyroid imaging, the thyroid activity appears mildly heterogeneous in the right lobe with a possible cold nodule inferiorly. The left lobe activity appears homogeneous.  2. Multinodular goiter  07/15/2014 thyroid ultrasound:  Right thyroid lobe: 4.5 x 1.9 x 1.9 cm. Multiple nodules are noted throughout the right lobe of the thyroid. There is diffuse irregular margined hypoechoic nodule measuring 16 mm in the upper pole. Multiple smaller nodules are identified.  Left thyroid lobe: 4.4 x 1.7 x 1.9 cm. Multiple hypoechoic nodules are noted within the left lobe of the thyroid. The largest of these measures 15 mm in greatest dimension lying within the lower pole  Isthmus Thickness: 0.5 cm. No nodules visualized.  Lymphadenopathy None visualized.  IMPRESSION: Multiple bilateral thyroid nodules. The dominant nodules bilateral meet criteria for percutaneous biopsy.   3. Prediabetes   PLAN:  1. Patient with a history of subclinical hyperthyroidism, initially with few possible thyrotoxic sxs: heat intolerance, palpitations, which have improved despite a TSH remaining low. She did not start [place] as recommended at last visit and did not return for an appt >> developed Afib. She only  started MMI 5 mg 2x a day in 05/2015 >> a f/u TSH was still low >> we increased the dose to 10 mg in am and 5 mg in pm, and she has been on this dose for 3 weeks now. She feels better and is in sinus rhythm. - Her thyroid uptake and scan was negative for thyroiditis but she had some nodules in the right lobe which could have been slightly overactive  - we discussed about possible modalities of tx >> MMI vs RAI tx. Due to the heart condition >> would suggest RAI tx, but will check labs on the new dose of MMI and reevaluate.  - will clear her for surgery (will have colostomy reversal soon) depending on the TFT results - we will check the TSH, fT3 and fT4 in 1 week (total 4 weeks on the new MMI dose)  2. Multinodular goiter - We again reviewed the reports of her previous thyroid ultrasound that showed several nodules, with the largest one being eligible for FNA. However, she was hyper thyroid at the time of her ultrasound, and it is  possible that the nodules were inflammatory. Therefore, we decided to repeat the thyroid ultrasound in one year after she becomes euthyroid. She agrees with the plan.  3. Prediabetes - We reviewed together her recent hemoglobin A1c from 5 months ago. This was borderline between prediabetes and diabetes. - We will recheck her hemoglobin A1c today >> 4.8% (lower!) - she is off metformin now >> continue to stay off   Return in about 6 months (around 01/28/2016).  - time spent with the patient: 40 min, of which >50% was spent in obtaining information about her symptoms, reviewing her previous labs, evaluations, and treatments, counseling her about her conditions (please see the discussed topics above), and developing a plan to further investigate them. she had a number of questions which I addressed.  Component     Latest Ref Rng 08/06/2015  TSH     0.35 - 4.50 uIU/mL 0.13 (L)  T3, Free     2.3 - 4.2 pg/mL 2.9  Free T4     0.60 - 1.60 ng/dL 0.49 (L)   The TSH is still  low but has definitely improved, while the free T4 is low and the free T3 is close to the lower limit of normal. As the TSH improvement usually lags behind the rest of the tests, I will suggest that she decreases the methimazole to 5 mg twice a day and repeat her tests in 4 weeks.   She is cleared for the surgery from the thyroid point of view.

## 2015-07-30 NOTE — Patient Instructions (Signed)
Please continue to stay off Metformin.  Come back in 1 week for labs.  Continue Methimazole 10 mg in am and 5 mg in pm.  Please come back for a follow-up appointment in 6 months

## 2015-08-06 ENCOUNTER — Other Ambulatory Visit (INDEPENDENT_AMBULATORY_CARE_PROVIDER_SITE_OTHER): Payer: BLUE CROSS/BLUE SHIELD

## 2015-08-06 DIAGNOSIS — E0590 Thyrotoxicosis, unspecified without thyrotoxic crisis or storm: Secondary | ICD-10-CM | POA: Diagnosis not present

## 2015-08-06 LAB — T4, FREE: Free T4: 0.49 ng/dL — ABNORMAL LOW (ref 0.60–1.60)

## 2015-08-06 LAB — T3, FREE: T3, Free: 2.9 pg/mL (ref 2.3–4.2)

## 2015-08-06 LAB — TSH: TSH: 0.13 u[IU]/mL — ABNORMAL LOW (ref 0.35–4.50)

## 2015-08-06 MED ORDER — METHIMAZOLE 5 MG PO TABS: ORAL_TABLET | ORAL | Status: DC

## 2015-08-06 NOTE — Addendum Note (Signed)
Addended by: Philemon Kingdom on: 08/06/2015 12:07 PM   Modules accepted: Orders

## 2015-08-07 ENCOUNTER — Encounter: Payer: Self-pay | Admitting: Internal Medicine

## 2015-08-12 ENCOUNTER — Encounter: Payer: Self-pay | Admitting: General Surgery

## 2015-08-12 NOTE — Progress Notes (Signed)
Alison Best. [name] 08/12/2015 4:00 PM Location: [facility] Patient #: [id number] DOB: [birth year] Married / Language: English / Race: White Female   History of Present Illness Alison Hollingshead MD; 08/12/2015 4:29 PM) The patient is a 59 year old female.  Note:She comes in today to talk about colostomy closure. She feels she is ready for physically and psychologically. She underwent emergency exploratory laparotomy, drainiage of interabdominal abcesses, SALPINGO OOPHORECTOMY, COLOSTOMY, Sigmoid Colectomy, CYSTOSCOPY - 02/20/2015. She reports that her colonoscopy was less than 5 years ago and she was told to come back in 10 years. Her hyperthyroidism is well controlled. No further episodes of atrial fibrillation since her hyperthyroidism has been controlled.  Allergies Elbert Ewings, CMA; 08/12/2015 4:00 PM) Wellbutrin *ANTIDEPRESSANTS* Levaquin *FLUOROQUINOLONES*  Medication History Elbert Ewings, CMA; 08/12/2015 4:01 PM) Tylenol Extra Strength (500MG  Tablet, Oral) Active. Lotrisone (1-0.05% Cream, 1 (one) Application External as directed, Taken starting 08/11/2015) Active. (15 gm tube ordered w/ one refill) Multivitamin Adult (Oral daily) Active. Aspirin (81MG  Tablet, Oral daily) Active. Metoprolol Succinate ER (50MG  Tablet ER 24HR, Oral daily) Active. Furosemide (20MG  Tablet, Oral) Active. Albuterol Sulfate (0.63MG /3ML Nebulized Soln, Inhalation) Active. Tapazole (5MG  Tablet, Oral two times daily) Active. Medications Reconciled  Vitals Elbert Ewings CMA; 08/12/2015 4:02 PM) 08/12/2015 4:02 PM Weight: 179 lb Height: 60.5in Body Surface Area: 1.79 m Body Mass Index: 34.38 kg/m  Temp.: 98.68F(Temporal)  Pulse: 65 (Regular)  BP: 132/64 (Sitting, Left Arm, Standard)       Physical Exam Alison Hollingshead MD; 08/12/2015 4:29 PM) The physical exam findings are as follows: Note:General-overweight female in no acute  distress.  Cardiovascular: Regular rate regular.  Abdomen-soft, well-healed midline scar. Colostomy in left side with a little weakness around it.    Assessment & Plan Alison Hollingshead MD; 08/12/2015 4:35 PM) STATUS POST HARTMANN PROCEDURE (Z93.3) COLOSTOMY IN PLACE (Z93.3) Impression: She is ready to have this reversed.  Plan: We'll order a barium enema through the colostomy and the rectum. We discussed laparoscopic possible open colostomy closure. I have explained the procedure and risks of colostomy closure. Risks include but are not limited to bleeding, infection, wound problems, anesthesia, anastomotic leak, need for another colostomy, need for reoperative surgery, injury to intraabominal organs (such as intestine, spleen, kidney, bladder, ureter, etc.), ileus, irregular bowel habits. She seems to understand and would like to proceed.Jackolyn Confer, MD

## 2015-08-13 ENCOUNTER — Telehealth: Payer: Self-pay | Admitting: Internal Medicine

## 2015-08-14 ENCOUNTER — Other Ambulatory Visit: Payer: Self-pay | Admitting: General Surgery

## 2015-08-14 DIAGNOSIS — Z933 Colostomy status: Secondary | ICD-10-CM

## 2015-08-14 NOTE — Telephone Encounter (Signed)
That is a good point. That's cancel that test for now.

## 2015-08-14 NOTE — Telephone Encounter (Signed)
Please read message below and advise if pt needs to still have the uptake and scan. Due to the fact her levels are better. Please advise.

## 2015-08-14 NOTE — Telephone Encounter (Signed)
Called pt and lvm advising her per Dr Arman Filter message below. Will cancel the uptake and scan.

## 2015-08-14 NOTE — Telephone Encounter (Signed)
Patientstated that she has a scheduled uptake an scan Nov 3rd & 4th, do she need to still to take that test, please advise

## 2015-08-15 NOTE — Telephone Encounter (Signed)
Called [facility] and cancelled the uptake and scan.

## 2015-08-25 ENCOUNTER — Other Ambulatory Visit: Payer: Self-pay

## 2015-08-25 DIAGNOSIS — Z1231 Encounter for screening mammogram for malignant neoplasm of breast: Secondary | ICD-10-CM

## 2015-08-28 ENCOUNTER — Encounter (HOSPITAL_COMMUNITY): Payer: BLUE CROSS/BLUE SHIELD

## 2015-08-29 ENCOUNTER — Encounter (HOSPITAL_COMMUNITY): Payer: BLUE CROSS/BLUE SHIELD

## 2015-09-09 ENCOUNTER — Ambulatory Visit
Admission: RE | Admit: 2015-09-09 | Discharge: 2015-09-09 | Disposition: A | Discharge status: Home / Self Care | Payer: BLUE CROSS/BLUE SHIELD | Source: Ambulatory Visit

## 2015-09-09 DIAGNOSIS — Z1231 Encounter for screening mammogram for malignant neoplasm of breast: Secondary | ICD-10-CM

## 2015-09-10 ENCOUNTER — Other Ambulatory Visit: Payer: BLUE CROSS/BLUE SHIELD

## 2015-09-14 ENCOUNTER — Other Ambulatory Visit: Payer: Self-pay | Admitting: Internal Medicine

## 2015-09-17 ENCOUNTER — Ambulatory Visit
Admission: RE | Admit: 2015-09-17 | Discharge: 2015-09-17 | Disposition: A | Discharge status: Home / Self Care | Payer: BLUE CROSS/BLUE SHIELD | Source: Ambulatory Visit | Attending: General Surgery | Admitting: General Surgery

## 2015-09-17 DIAGNOSIS — Z933 Colostomy status: Secondary | ICD-10-CM

## 2015-09-22 NOTE — Patient Instructions (Addendum)
Alison Best  09/22/2015   Your procedure is scheduled on: Friday 09/26/2015  Report to [facility] Main  Entrance take [place]  elevators to 3rd floor to  [place] at [place] AM.  Call this number if you have problems the morning of surgery [phone]   Remember: ONLY 1 PERSON MAY GO WITH YOU TO SHORT STAY TO GET  READY MORNING OF [place].   Do not eat food or drink liquids :After Midnight.   FOLLOW BOWEL PREP INSTRUCTIONS FROM DR. ROSENBOWER AND FOLLOW A CLEAR LIQUID DIET ON THAT DAY ALSO TIL MIDNIGHT!   Take these medicines the morning of surgery with A SIP OF WATER:  Metoprolol, Tapazole,  use Albuterol inhaler if needed              DO NOT TAKE ANY DIABETIC MEDICATIONS DAY OF YOUR SURGERY!   CLEAR LIQUID DIET   Foods Allowed                                                                     Foods Excluded  Coffee and tea, regular and decaf                             liquids that you cannot  Plain Jell-O in any flavor                                             see through such as: Fruit ices (not with fruit pulp)                                     milk, soups, orange juice  Iced Popsicles                                    All solid food Carbonated beverages, regular and diet                                    Cranberry, grape and apple juices Sports drinks like Gatorade Lightly seasoned clear broth or consume(fat free) Sugar, honey syrup  Sample Menu Breakfast                                Lunch                                     Supper Cranberry juice                    Beef broth  Chicken broth Jell-O                                     Grape juice                           Apple juice Coffee or tea                        Jell-O                                      Popsicle                                                Coffee or tea                        Coffee or  tea  _____________________________________________________________________                                 Dennis Bast may not have any metal on your body including hair pins and              piercings  Do not wear jewelry, make-up, lotions, powders or perfumes, deodorant             Do not wear nail polish.  Do not shave  48 hours prior to surgery.              Men may shave face and neck.   Do not bring valuables to the hospital. [place].  Contacts, dentures or bridgework may not be worn into surgery.  Leave suitcase in the car. After surgery it may be brought to your room.     Patients discharged the day of surgery will not be allowed to drive home.  Name and phone number of your driver:  Special Instructions: N/A              Please read over the following fact sheets you were given: _____________________________________________________________________             [facility] - Preparing for Surgery Before surgery, you can play an important role.  Because skin is not sterile, your skin needs to be as free of germs as possible.  You can reduce the number of germs on your skin by washing with CHG (chlorahexidine gluconate) soap before surgery.  CHG is an antiseptic cleaner which kills germs and bonds with the skin to continue killing germs even after washing. Please DO NOT use if you have an allergy to CHG or antibacterial soaps.  If your skin becomes reddened/irritated stop using the CHG and inform your nurse when you arrive at Short Stay. Do not shave (including legs and underarms) for at least 48 hours prior to the first CHG shower.  You may shave your face/neck. Please follow these instructions carefully:  1.  Shower with CHG Soap the night before surgery and the  morning  of Surgery.  2.  If you choose to wash your hair, wash your hair first as usual with your  normal  shampoo.  3.  After you shampoo, rinse your hair and body  thoroughly to remove the  shampoo.                           4.  Use CHG as you would any other liquid soap.  You can apply chg directly  to the skin and wash                       Gently with a scrungie or clean washcloth.  5.  Apply the CHG Soap to your body ONLY FROM THE NECK DOWN.   Do not use on face/ open                           Wound or open sores. Avoid contact with eyes, ears mouth and genitals (private parts).                       Wash face,  Genitals (private parts) with your normal soap.             6.  Wash thoroughly, paying special attention to the area where your surgery  will be performed.  7.  Thoroughly rinse your body with warm water from the neck down.  8.  DO NOT shower/wash with your normal soap after using and rinsing off  the CHG Soap.                9.  Pat yourself dry with a clean towel.            10.  Wear clean pajamas.            11.  Place clean sheets on your bed the night of your first shower and do not  sleep with pets. Day of Surgery : Do not apply any lotions/deodorants the morning of surgery.  Please wear clean clothes to the hospital/surgery center.  FAILURE TO FOLLOW THESE INSTRUCTIONS MAY RESULT IN THE CANCELLATION OF YOUR SURGERY PATIENT SIGNATURE_________________________________  NURSE SIGNATURE__________________________________  ________________________________________________________________________   Adam Phenix  An incentive spirometer is a tool that can help keep your lungs clear and active. This tool measures how well you are filling your lungs with each breath. Taking long deep breaths may help reverse or decrease the chance of developing breathing (pulmonary) problems (especially infection) following:  A long period of time when you are unable to move or be active. BEFORE THE PROCEDURE   If the spirometer includes an indicator to show your best effort, your nurse or respiratory therapist will set it to a desired goal.  If  possible, sit up straight or lean slightly forward. Try not to slouch.  Hold the incentive spirometer in an upright position. INSTRUCTIONS FOR USE   Sit on the edge of your bed if possible, or sit up as far as you can in bed or on a chair.  Hold the incentive spirometer in an upright position.  Breathe out normally.  Place the mouthpiece in your mouth and seal your lips tightly around it.  Breathe in slowly and as deeply as possible, raising the piston or the ball toward the top of the column.  Hold your breath for 3-5 seconds or for as long as possible.  Allow the piston or ball to fall to the bottom of the column.  Remove the mouthpiece from your mouth and breathe out normally.  Rest for a few seconds and repeat Steps 1 through 7 at least 10 times every 1-2 hours when you are awake. Take your time and take a few normal breaths between deep breaths.  The spirometer may include an indicator to show your best effort. Use the indicator as a goal to work toward during each repetition.  After each set of 10 deep breaths, practice coughing to be sure your lungs are clear. If you have an incision (the cut made at the time of surgery), support your incision when coughing by placing a pillow or rolled up towels firmly against it. Once you are able to get out of bed, walk around indoors and cough well. You may stop using the incentive spirometer when instructed by your caregiver.  RISKS AND COMPLICATIONS  Take your time so you do not get dizzy or light-headed.  If you are in pain, you may need to take or ask for pain medication before doing incentive spirometry. It is harder to take a deep breath if you are having pain. AFTER USE  Rest and breathe slowly and easily.  It can be helpful to keep track of a log of your progress. Your caregiver can provide you with a simple table to help with this. If you are using the spirometer at home, follow these instructions: [place] IF:   You  are having difficultly using the spirometer.  You have trouble using the spirometer as often as instructed.  Your pain medication is not giving enough relief while using the spirometer.  You develop fever of 100.5 F (38.1 C) or higher. SEEK IMMEDIATE MEDICAL CARE IF:   You cough up bloody sputum that had not been present before.  You develop fever of 102 F (38.9 C) or greater.  You develop worsening pain at or near the incision site. MAKE SURE YOU:   Understand these instructions.  Will watch your condition.  Will get help right away if you are not doing well or get worse. Document Released: 02/21/2007 Document Revised: 01/03/2012 Document Reviewed: 04/24/2007 ExitCare Patient Information 2014 [employer], [place].   ________________________________________________________________________  WHAT IS A BLOOD TRANSFUSION? Blood Transfusion Information  A transfusion is the replacement of blood or some of its parts. Blood is made up of multiple cells which provide different functions.  Red blood cells carry oxygen and are used for blood loss replacement.  White blood cells fight against infection.  Platelets control bleeding.  Plasma helps clot blood.  Other blood products are available for specialized needs, such as hemophilia or other clotting disorders. BEFORE THE TRANSFUSION  Who gives blood for transfusions?   Healthy volunteers who are fully evaluated to make sure their blood is safe. This is blood bank blood. Transfusion therapy is the safest it has ever been in the practice of medicine. Before blood is taken from a donor, a complete history is taken to make sure that person has no history of diseases nor engages in risky social behavior (examples are intravenous drug use or sexual activity with multiple partners). The donor's travel history is screened to minimize risk of transmitting infections, such as malaria. The donated blood is tested for signs of infectious  diseases, such as HIV and hepatitis. The blood is then tested to be sure it is compatible with you in order to minimize the chance of a transfusion  reaction. If you or a relative donates blood, this is often done in anticipation of surgery and is not appropriate for emergency situations. It takes many days to process the donated blood. RISKS AND COMPLICATIONS Although transfusion therapy is very safe and saves many lives, the main dangers of transfusion include:   Getting an infectious disease.  Developing a transfusion reaction. This is an allergic reaction to something in the blood you were given. Every precaution is taken to prevent this. The decision to have a blood transfusion has been considered carefully by your caregiver before blood is given. Blood is not given unless the benefits outweigh the risks. AFTER THE TRANSFUSION  Right after receiving a blood transfusion, you will usually feel much better and more energetic. This is especially true if your red blood cells have gotten low (anemic). The transfusion raises the level of the red blood cells which carry oxygen, and this usually causes an energy increase.  The nurse administering the transfusion will monitor you carefully for complications. HOME CARE INSTRUCTIONS  No special instructions are needed after a transfusion. You may find your energy is better. Speak with your caregiver about any limitations on activity for underlying diseases you may have. SEEK MEDICAL CARE IF:   Your condition is not improving after your transfusion.  You develop redness or irritation at the intravenous (IV) site. SEEK IMMEDIATE MEDICAL CARE IF:  Any of the following symptoms occur over the next 12 hours:  Shaking chills.  You have a temperature by mouth above 102 F (38.9 C), not controlled by medicine.  Chest, back, or muscle pain.  People around you feel you are not acting correctly or are confused.  Shortness of breath or difficulty  breathing.  Dizziness and fainting.  You get a rash or develop hives.  You have a decrease in urine output.  Your urine turns a dark color or changes to pink, red, or brown. Any of the following symptoms occur over the next 10 days:  You have a temperature by mouth above 102 F (38.9 C), not controlled by medicine.  Shortness of breath.  Weakness after normal activity.  The white part of the eye turns yellow (jaundice).  You have a decrease in the amount of urine or are urinating less often.  Your urine turns a dark color or changes to pink, red, or brown. Document Released: 10/08/2000 Document Revised: 01/03/2012 Document Reviewed: 05/27/2008 [facility] Patient Information 2014 [place], [place].  _______________________________________________________________________

## 2015-09-23 ENCOUNTER — Inpatient Hospital Stay (HOSPITAL_COMMUNITY): Admission: RE | Admit: 2015-09-23 | Payer: BLUE CROSS/BLUE SHIELD | Source: Ambulatory Visit

## 2015-09-24 ENCOUNTER — Ambulatory Visit (HOSPITAL_COMMUNITY)
Admission: RE | Admit: 2015-09-24 | Discharge: 2015-09-24 | Disposition: A | Discharge status: Home / Self Care | Payer: BLUE CROSS/BLUE SHIELD | Source: Ambulatory Visit | Attending: Anesthesiology | Admitting: Anesthesiology

## 2015-09-24 ENCOUNTER — Encounter (HOSPITAL_COMMUNITY): Payer: Self-pay

## 2015-09-24 ENCOUNTER — Encounter (HOSPITAL_COMMUNITY)
Admission: RE | Admit: 2015-09-24 | Discharge: 2015-09-24 | Disposition: A | Discharge status: Home / Self Care | Payer: BLUE CROSS/BLUE SHIELD | Source: Ambulatory Visit | Attending: General Surgery | Admitting: General Surgery

## 2015-09-24 DIAGNOSIS — I2510 Atherosclerotic heart disease of native coronary artery without angina pectoris: Secondary | ICD-10-CM

## 2015-09-24 HISTORY — DX: Unspecified osteoarthritis, unspecified site: M19.90

## 2015-09-24 HISTORY — DX: Thyrotoxicosis, unspecified without thyrotoxic crisis or storm: E05.90

## 2015-09-24 LAB — CBC WITH DIFFERENTIAL/PLATELET
Basophils Absolute: 0 10*3/uL (ref 0.0–0.1)
Basophils Relative: 1 %
EOS ABS: 0.2 10*3/uL (ref 0.0–0.7)
Eosinophils Relative: 3 %
HCT: 45.5 % (ref 36.0–46.0)
HEMOGLOBIN: 15.2 g/dL — AB (ref 12.0–15.0)
LYMPHS ABS: 2.4 10*3/uL (ref 0.7–4.0)
LYMPHS PCT: 37 %
MCH: 29.7 pg (ref 26.0–34.0)
MCHC: 33.4 g/dL (ref 30.0–36.0)
MCV: 88.9 fL (ref 78.0–100.0)
MONOS PCT: 7 %
Monocytes Absolute: 0.5 10*3/uL (ref 0.1–1.0)
NEUTROS PCT: 52 %
Neutro Abs: 3.3 10*3/uL (ref 1.7–7.7)
Platelets: 261 10*3/uL (ref 150–400)
RBC: 5.12 MIL/uL — AB (ref 3.87–5.11)
RDW: 16.3 % — ABNORMAL HIGH (ref 11.5–15.5)
WBC: 6.3 10*3/uL (ref 4.0–10.5)

## 2015-09-24 LAB — PROTIME-INR
INR: 0.96 (ref 0.00–1.49)
PROTHROMBIN TIME: 13 s (ref 11.6–15.2)

## 2015-09-25 NOTE — Anesthesia Preprocedure Evaluation (Addendum)
Anesthesia Evaluation  Patient identified by MRN, date of birth, ID band Patient awake    Reviewed: Allergy & Precautions, NPO status , Patient's Chart, lab work & pertinent test results  Airway Mallampati: II  TM Distance: >3 FB Neck ROM: Full    Dental  (+) Teeth Intact   Pulmonary asthma , sleep apnea , former smoker,    breath sounds clear to auscultation       Cardiovascular + Peripheral Vascular Disease   Rhythm:Regular Rate:Normal     Neuro/Psych    GI/Hepatic GERD  ,  Endo/Other  Hyperthyroidism   Renal/GU      Musculoskeletal  (+) Arthritis ,   Abdominal   Peds  Hematology   Anesthesia Other Findings   Reproductive/Obstetrics                            Anesthesia Physical Anesthesia Plan  ASA: III  Anesthesia Plan: General   Post-op Pain Management:    Induction: Intravenous  Airway Management Planned: Oral ETT  Additional Equipment:   Intra-op Plan:   Post-operative Plan: Extubation in OR  Informed Consent: I have reviewed the patients History and Physical, chart, labs and discussed the procedure including the risks, benefits and alternatives for the proposed anesthesia with the patient or authorized representative who has indicated his/her understanding and acceptance.   Dental advisory given  Plan Discussed with: CRNA and Surgeon  Anesthesia Plan Comments:        Anesthesia Quick Evaluation

## 2015-09-26 ENCOUNTER — Inpatient Hospital Stay (HOSPITAL_COMMUNITY): Payer: BLUE CROSS/BLUE SHIELD | Admitting: Anesthesiology

## 2015-09-26 ENCOUNTER — Encounter (HOSPITAL_COMMUNITY): Payer: Self-pay | Admitting: *Deleted

## 2015-09-26 ENCOUNTER — Encounter (HOSPITAL_COMMUNITY)
Admission: RE | Disposition: A | Discharge status: Home Health | Payer: Self-pay | Source: Ambulatory Visit | Attending: General Surgery

## 2015-09-26 ENCOUNTER — Inpatient Hospital Stay (HOSPITAL_COMMUNITY)
Admission: RE | Admit: 2015-09-26 | Discharge: 2015-10-07 | DRG: 336 | Disposition: A | Discharge status: Home Health | Payer: BLUE CROSS/BLUE SHIELD | Source: Ambulatory Visit | Attending: General Surgery | Admitting: General Surgery

## 2015-09-26 DIAGNOSIS — Z8249 Family history of ischemic heart disease and other diseases of the circulatory system: Secondary | ICD-10-CM | POA: Diagnosis not present

## 2015-09-26 DIAGNOSIS — K435 Parastomal hernia without obstruction or  gangrene: Secondary | ICD-10-CM | POA: Diagnosis present

## 2015-09-26 DIAGNOSIS — G4730 Sleep apnea, unspecified: Secondary | ICD-10-CM | POA: Diagnosis present

## 2015-09-26 DIAGNOSIS — K567 Ileus, unspecified: Secondary | ICD-10-CM | POA: Diagnosis not present

## 2015-09-26 DIAGNOSIS — E669 Obesity, unspecified: Secondary | ICD-10-CM | POA: Diagnosis present

## 2015-09-26 DIAGNOSIS — Z7982 Long term (current) use of aspirin: Secondary | ICD-10-CM | POA: Diagnosis not present

## 2015-09-26 DIAGNOSIS — L7634 Postprocedural seroma of skin and subcutaneous tissue following other procedure: Secondary | ICD-10-CM | POA: Diagnosis not present

## 2015-09-26 DIAGNOSIS — Z87891 Personal history of nicotine dependence: Secondary | ICD-10-CM | POA: Diagnosis not present

## 2015-09-26 DIAGNOSIS — R509 Fever, unspecified: Secondary | ICD-10-CM | POA: Diagnosis not present

## 2015-09-26 DIAGNOSIS — R5082 Postprocedural fever: Secondary | ICD-10-CM

## 2015-09-26 DIAGNOSIS — Z808 Family history of malignant neoplasm of other organs or systems: Secondary | ICD-10-CM | POA: Diagnosis not present

## 2015-09-26 DIAGNOSIS — K660 Peritoneal adhesions (postprocedural) (postinfection): Secondary | ICD-10-CM | POA: Diagnosis present

## 2015-09-26 DIAGNOSIS — Z79899 Other long term (current) drug therapy: Secondary | ICD-10-CM | POA: Diagnosis not present

## 2015-09-26 DIAGNOSIS — Z433 Encounter for attention to colostomy: Secondary | ICD-10-CM | POA: Diagnosis present

## 2015-09-26 DIAGNOSIS — Z6836 Body mass index (BMI) 36.0-36.9, adult: Secondary | ICD-10-CM | POA: Diagnosis not present

## 2015-09-26 DIAGNOSIS — I739 Peripheral vascular disease, unspecified: Secondary | ICD-10-CM | POA: Diagnosis present

## 2015-09-26 DIAGNOSIS — Z933 Colostomy status: Secondary | ICD-10-CM

## 2015-09-26 DIAGNOSIS — E876 Hypokalemia: Secondary | ICD-10-CM | POA: Diagnosis not present

## 2015-09-26 DIAGNOSIS — E0590 Thyrotoxicosis, unspecified without thyrotoxic crisis or storm: Secondary | ICD-10-CM | POA: Diagnosis present

## 2015-09-26 DIAGNOSIS — R188 Other ascites: Secondary | ICD-10-CM

## 2015-09-26 DIAGNOSIS — L02211 Cutaneous abscess of abdominal wall: Secondary | ICD-10-CM

## 2015-09-26 HISTORY — PX: ILEO LOOP COLOSTOMY CLOSURE: SHX5257

## 2015-09-26 LAB — TYPE AND SCREEN
ABO/RH(D): O POS
Antibody Screen: POSITIVE
DAT, IGG: NEGATIVE
UNIT DIVISION: 0
UNIT DIVISION: 0

## 2015-09-26 LAB — COMPREHENSIVE METABOLIC PANEL
ALBUMIN: 4.1 g/dL (ref 3.5–5.0)
ALT: 28 U/L (ref 14–54)
ANION GAP: 10 (ref 5–15)
AST: 29 U/L (ref 15–41)
Alkaline Phosphatase: 151 U/L — ABNORMAL HIGH (ref 38–126)
BILIRUBIN TOTAL: 0.7 mg/dL (ref 0.3–1.2)
BUN: 11 mg/dL (ref 6–20)
CHLORIDE: 105 mmol/L (ref 101–111)
CO2: 25 mmol/L (ref 22–32)
Calcium: 9.2 mg/dL (ref 8.9–10.3)
Creatinine, Ser: 0.81 mg/dL (ref 0.44–1.00)
GFR calc Af Amer: >60 mL/min (ref >60)
GFR calc non Af Amer: >60 mL/min (ref >60)
GLUCOSE: 112 mg/dL — AB (ref 65–99)
POTASSIUM: 3.9 mmol/L (ref 3.5–5.1)
SODIUM: 140 mmol/L (ref 135–145)
TOTAL PROTEIN: 7.7 g/dL (ref 6.5–8.1)

## 2015-09-26 SURGERY — CLOSURE, ILEOSTOMY, LAPAROSCOPIC, WITH LAPAROTOMY IF INDICATED
Anesthesia: General | Site: Abdomen

## 2015-09-26 MED ORDER — NALOXONE HCL 0.4 MG/ML IJ SOLN: 0.4000 mg | INTRAMUSCULAR | Status: DC | PRN

## 2015-09-26 MED ORDER — LIDOCAINE HCL (CARDIAC) 20 MG/ML IV SOLN
INTRAVENOUS | Status: DC | PRN
  Administered 2015-09-26: 50 mg via INTRAVENOUS

## 2015-09-26 MED ORDER — DEXAMETHASONE SODIUM PHOSPHATE 10 MG/ML IJ SOLN
INTRAMUSCULAR | Status: AC
  Filled 2015-09-26: qty 1

## 2015-09-26 MED ORDER — HYDROMORPHONE HCL 1 MG/ML IJ SOLN
INTRAMUSCULAR | Status: AC
  Filled 2015-09-26: qty 1

## 2015-09-26 MED ORDER — ONDANSETRON HCL 4 MG/2ML IJ SOLN
INTRAMUSCULAR | Status: AC
  Filled 2015-09-26: qty 2

## 2015-09-26 MED ORDER — PROPOFOL 10 MG/ML IV BOLUS
INTRAVENOUS | Status: DC | PRN
  Administered 2015-09-26: 180 mg via INTRAVENOUS

## 2015-09-26 MED ORDER — METHIMAZOLE 5 MG PO TABS
5.0000 mg | ORAL_TABLET | Freq: Two times a day (BID) | ORAL | Status: DC
  Administered 2015-09-26 – 2015-10-07 (×22): 5 mg via ORAL
  Filled 2015-09-26 (×26): qty 1

## 2015-09-26 MED ORDER — FENTANYL CITRATE (PF) 100 MCG/2ML IJ SOLN
INTRAMUSCULAR | Status: DC | PRN
  Administered 2015-09-26 (×2): 50 ug via INTRAVENOUS
  Administered 2015-09-26: 100 ug via INTRAVENOUS
  Administered 2015-09-26: 50 ug via INTRAVENOUS

## 2015-09-26 MED ORDER — MORPHINE SULFATE 2 MG/ML IV SOLN
INTRAVENOUS | Status: DC
  Administered 2015-09-26: 7.5 mg via INTRAVENOUS
  Administered 2015-09-26: 3 mg via INTRAVENOUS
  Administered 2015-09-26: 1 mg via INTRAVENOUS
  Administered 2015-09-26: 9 mg via INTRAVENOUS
  Administered 2015-09-27: 7.5 mg via INTRAVENOUS
  Administered 2015-09-27: 3 mg via INTRAVENOUS
  Administered 2015-09-27: 10.5 mg via INTRAVENOUS
  Administered 2015-09-27: 30 mg via INTRAVENOUS
  Administered 2015-09-27: 10.5 mg via INTRAVENOUS
  Administered 2015-09-27: 4.7 mg via INTRAVENOUS
  Administered 2015-09-28: 6 mg via INTRAVENOUS
  Administered 2015-09-28: 10.5 mg via INTRAVENOUS
  Administered 2015-09-28: 9 mg via INTRAVENOUS
  Administered 2015-09-28: 7.14 mg via INTRAVENOUS
  Filled 2015-09-26 (×2): qty 25

## 2015-09-26 MED ORDER — CEFOTETAN DISODIUM-DEXTROSE 2-2.08 GM-% IV SOLR
INTRAVENOUS | Status: AC
  Filled 2015-09-26: qty 50

## 2015-09-26 MED ORDER — FUROSEMIDE 10 MG/ML IJ SOLN
INTRAMUSCULAR | Status: DC | PRN
  Administered 2015-09-26: 10 mg via INTRAMUSCULAR

## 2015-09-26 MED ORDER — KCL IN DEXTROSE-NACL 20-5-0.9 MEQ/L-%-% IV SOLN
INTRAVENOUS | Status: DC
  Administered 2015-09-26 – 2015-09-27 (×4): via INTRAVENOUS
  Administered 2015-09-28: 100 mL/h via INTRAVENOUS
  Administered 2015-09-28: 11:00:00 via INTRAVENOUS
  Administered 2015-09-29: 1000 mL via INTRAVENOUS
  Administered 2015-09-30: 06:00:00 via INTRAVENOUS
  Filled 2015-09-26 (×13): qty 1000

## 2015-09-26 MED ORDER — SUCCINYLCHOLINE CHLORIDE 20 MG/ML IJ SOLN
INTRAMUSCULAR | Status: DC | PRN
  Administered 2015-09-26: 100 mg via INTRAVENOUS

## 2015-09-26 MED ORDER — SUGAMMADEX SODIUM 200 MG/2ML IV SOLN
INTRAVENOUS | Status: DC | PRN
  Administered 2015-09-26: 200 mg via INTRAVENOUS

## 2015-09-26 MED ORDER — ALVIMOPAN 12 MG PO CAPS
12.0000 mg | ORAL_CAPSULE | Freq: Once | ORAL | Status: AC
  Administered 2015-09-26: 12 mg via ORAL
  Filled 2015-09-26: qty 1

## 2015-09-26 MED ORDER — SUGAMMADEX SODIUM 200 MG/2ML IV SOLN
INTRAVENOUS | Status: AC
  Filled 2015-09-26: qty 2

## 2015-09-26 MED ORDER — BUPIVACAINE HCL (PF) 0.5 % IJ SOLN
INTRAMUSCULAR | Status: AC
  Filled 2015-09-26: qty 60

## 2015-09-26 MED ORDER — DIPHENHYDRAMINE HCL 12.5 MG/5ML PO ELIX: 12.5000 mg | ORAL_SOLUTION | Freq: Four times a day (QID) | ORAL | Status: DC | PRN

## 2015-09-26 MED ORDER — LACTATED RINGERS IV SOLN
INTRAVENOUS | Status: DC | PRN
  Administered 2015-09-26 (×4): via INTRAVENOUS

## 2015-09-26 MED ORDER — METOPROLOL SUCCINATE ER 50 MG PO TB24
50.0000 mg | ORAL_TABLET | Freq: Every day | ORAL | Status: DC
  Administered 2015-09-27 – 2015-10-06 (×6): 50 mg via ORAL
  Filled 2015-09-26 (×11): qty 1

## 2015-09-26 MED ORDER — FENTANYL CITRATE (PF) 250 MCG/5ML IJ SOLN
INTRAMUSCULAR | Status: AC
  Filled 2015-09-26: qty 5

## 2015-09-26 MED ORDER — EPHEDRINE SULFATE 50 MG/ML IJ SOLN
INTRAMUSCULAR | Status: DC | PRN
  Administered 2015-09-26 (×3): 5 mg via INTRAVENOUS

## 2015-09-26 MED ORDER — FUROSEMIDE 10 MG/ML IJ SOLN
INTRAMUSCULAR | Status: AC
  Filled 2015-09-26: qty 2

## 2015-09-26 MED ORDER — PHENYLEPHRINE 40 MCG/ML (10ML) SYRINGE FOR IV PUSH (FOR BLOOD PRESSURE SUPPORT)
PREFILLED_SYRINGE | INTRAVENOUS | Status: AC
  Filled 2015-09-26: qty 10

## 2015-09-26 MED ORDER — 0.9 % SODIUM CHLORIDE (POUR BTL) OPTIME
TOPICAL | Status: DC | PRN
  Administered 2015-09-26: 1000 mL
  Administered 2015-09-26: 4000 mL
  Administered 2015-09-26 (×3): 1000 mL

## 2015-09-26 MED ORDER — HYDROMORPHONE HCL 1 MG/ML IJ SOLN
0.2500 mg | INTRAMUSCULAR | Status: DC | PRN
  Administered 2015-09-26: 0.5 mg via INTRAVENOUS
  Administered 2015-09-26 (×2): 0.25 mg via INTRAVENOUS
  Administered 2015-09-26 (×2): 0.5 mg via INTRAVENOUS

## 2015-09-26 MED ORDER — CEFOTETAN DISODIUM-DEXTROSE 2-2.08 GM-% IV SOLR
2.0000 g | INTRAVENOUS | Status: AC
  Administered 2015-09-26: 2 g via INTRAVENOUS

## 2015-09-26 MED ORDER — ROCURONIUM BROMIDE 100 MG/10ML IV SOLN
INTRAVENOUS | Status: AC
  Filled 2015-09-26: qty 1

## 2015-09-26 MED ORDER — PROPOFOL 10 MG/ML IV BOLUS
INTRAVENOUS | Status: AC
  Filled 2015-09-26: qty 20

## 2015-09-26 MED ORDER — LACTATED RINGERS IV SOLN: INTRAVENOUS | Status: DC

## 2015-09-26 MED ORDER — ALBUTEROL SULFATE (2.5 MG/3ML) 0.083% IN NEBU: 3.0000 mL | INHALATION_SOLUTION | RESPIRATORY_TRACT | Status: DC | PRN

## 2015-09-26 MED ORDER — PHENYLEPHRINE HCL 10 MG/ML IJ SOLN
INTRAMUSCULAR | Status: DC | PRN
  Administered 2015-09-26: 80 ug via INTRAVENOUS

## 2015-09-26 MED ORDER — ONDANSETRON HCL 4 MG/2ML IJ SOLN: 4.0000 mg | INTRAMUSCULAR | Status: DC | PRN

## 2015-09-26 MED ORDER — MIDAZOLAM HCL 5 MG/5ML IJ SOLN
INTRAMUSCULAR | Status: DC | PRN
  Administered 2015-09-26: 2 mg via INTRAVENOUS

## 2015-09-26 MED ORDER — ONDANSETRON HCL 4 MG PO TABS: 4.0000 mg | ORAL_TABLET | Freq: Four times a day (QID) | ORAL | Status: DC | PRN

## 2015-09-26 MED ORDER — MORPHINE SULFATE 2 MG/ML IV SOLN
INTRAVENOUS | Status: AC
  Administered 2015-09-26: 3 mg
  Filled 2015-09-26: qty 25

## 2015-09-26 MED ORDER — DEXTROSE 5 % IV SOLN
2.0000 g | Freq: Two times a day (BID) | INTRAVENOUS | Status: AC
  Administered 2015-09-26: 2 g via INTRAVENOUS
  Filled 2015-09-26: qty 2

## 2015-09-26 MED ORDER — ALVIMOPAN 12 MG PO CAPS
12.0000 mg | ORAL_CAPSULE | Freq: Two times a day (BID) | ORAL | Status: DC
  Administered 2015-09-27 (×2): 12 mg via ORAL
  Filled 2015-09-26 (×4): qty 1

## 2015-09-26 MED ORDER — PROMETHAZINE HCL 25 MG/ML IJ SOLN: 6.2500 mg | INTRAMUSCULAR | Status: DC | PRN

## 2015-09-26 MED ORDER — HEPARIN SODIUM (PORCINE) 5000 UNIT/ML IJ SOLN
5000.0000 [IU] | Freq: Three times a day (TID) | INTRAMUSCULAR | Status: DC
  Administered 2015-09-27 – 2015-10-07 (×30): 5000 [IU] via SUBCUTANEOUS
  Filled 2015-09-26 (×34): qty 1

## 2015-09-26 MED ORDER — DEXAMETHASONE SODIUM PHOSPHATE 10 MG/ML IJ SOLN
INTRAMUSCULAR | Status: DC | PRN
  Administered 2015-09-26: 10 mg via INTRAVENOUS

## 2015-09-26 MED ORDER — MIDAZOLAM HCL 2 MG/2ML IJ SOLN
INTRAMUSCULAR | Status: AC
  Filled 2015-09-26: qty 2

## 2015-09-26 MED ORDER — ROCURONIUM BROMIDE 100 MG/10ML IV SOLN
INTRAVENOUS | Status: DC | PRN
  Administered 2015-09-26: 10 mg via INTRAVENOUS
  Administered 2015-09-26: 35 mg via INTRAVENOUS
  Administered 2015-09-26 (×2): 10 mg via INTRAVENOUS
  Administered 2015-09-26: 5 mg via INTRAVENOUS
  Administered 2015-09-26: 10 mg via INTRAVENOUS

## 2015-09-26 MED ORDER — EPHEDRINE SULFATE 50 MG/ML IJ SOLN
INTRAMUSCULAR | Status: AC
  Filled 2015-09-26: qty 1

## 2015-09-26 MED ORDER — DIPHENHYDRAMINE HCL 50 MG/ML IJ SOLN: 12.5000 mg | Freq: Four times a day (QID) | INTRAMUSCULAR | Status: DC | PRN

## 2015-09-26 MED ORDER — ONDANSETRON HCL 4 MG/2ML IJ SOLN: 4.0000 mg | Freq: Four times a day (QID) | INTRAMUSCULAR | Status: DC | PRN

## 2015-09-26 MED ORDER — SODIUM CHLORIDE 0.9 % IJ SOLN: 9.0000 mL | INTRAMUSCULAR | Status: DC | PRN

## 2015-09-26 MED ORDER — LIDOCAINE HCL (CARDIAC) 20 MG/ML IV SOLN
INTRAVENOUS | Status: AC
  Filled 2015-09-26: qty 5

## 2015-09-26 MED ORDER — BUPIVACAINE HCL (PF) 0.5 % IJ SOLN
INTRAMUSCULAR | Status: DC | PRN
  Administered 2015-09-26: 8 mL

## 2015-09-26 MED ORDER — ONDANSETRON HCL 4 MG/2ML IJ SOLN
INTRAMUSCULAR | Status: DC | PRN
  Administered 2015-09-26: 4 mg via INTRAVENOUS

## 2015-09-26 MED ORDER — LACTATED RINGERS IR SOLN
Status: DC | PRN
  Administered 2015-09-26: 1000 mL

## 2015-09-26 MED ORDER — PANTOPRAZOLE SODIUM 40 MG IV SOLR
40.0000 mg | Freq: Every day | INTRAVENOUS | Status: DC
  Administered 2015-09-26 – 2015-10-03 (×8): 40 mg via INTRAVENOUS
  Filled 2015-09-26 (×8): qty 40

## 2015-09-26 SURGICAL SUPPLY — 78 items
APPLIER CLIP 5 13 M/L LIGAMAX5 (MISCELLANEOUS)
APPLIER CLIP ROT 10 11.4 M/L (STAPLE)
BLADE EXTENDED COATED 6.5IN (ELECTRODE) IMPLANT
BLADE HEX COATED 2.75 (ELECTRODE) ×2 IMPLANT
CABLE HIGH FREQUENCY MONO STRZ (ELECTRODE) ×2 IMPLANT
CELLS DAT CNTRL 66122 CELL SVR (MISCELLANEOUS) IMPLANT
CLIP APPLIE 5 13 M/L LIGAMAX5 (MISCELLANEOUS) IMPLANT
CLIP APPLIE ROT 10 11.4 M/L (STAPLE) IMPLANT
COVER SURGICAL LIGHT HANDLE (MISCELLANEOUS) ×4 IMPLANT
DECANTER SPIKE VIAL GLASS SM (MISCELLANEOUS) IMPLANT
DISSECTOR BLUNT TIP ENDO 5MM (MISCELLANEOUS) IMPLANT
DRAIN CHANNEL 19F RND (DRAIN) ×2 IMPLANT
DRAPE LAPAROSCOPIC ABDOMINAL (DRAPES) ×2 IMPLANT
DRSG OPSITE POSTOP 4X10 (GAUZE/BANDAGES/DRESSINGS) ×2 IMPLANT
DRSG TEGADERM 4X4.75 (GAUZE/BANDAGES/DRESSINGS) ×2 IMPLANT
ELECT REM PT RETURN 9FT ADLT (ELECTROSURGICAL) ×2
ELECTRODE REM PT RTRN 9FT ADLT (ELECTROSURGICAL) ×1 IMPLANT
EVACUATOR DRAINAGE 10X20 100CC (DRAIN) ×1 IMPLANT
EVACUATOR SILICONE 100CC (DRAIN) ×1
FILTER SMOKE EVAC LAPAROSHD (FILTER) IMPLANT
GAUZE SPONGE 2X2 8PLY STRL LF (GAUZE/BANDAGES/DRESSINGS) ×1 IMPLANT
GAUZE SPONGE 4X4 12PLY STRL (GAUZE/BANDAGES/DRESSINGS) ×2 IMPLANT
GAUZE SPONGE 4X4 16PLY XRAY LF (GAUZE/BANDAGES/DRESSINGS) ×2 IMPLANT
GLOVE BIO SURGEON STRL SZ 6 (GLOVE) ×2 IMPLANT
GLOVE BIO SURGEON STRL SZ7 (GLOVE) ×4 IMPLANT
GLOVE BIOGEL PI IND STRL 7.0 (GLOVE) ×5 IMPLANT
GLOVE BIOGEL PI IND STRL 7.5 (GLOVE) ×6 IMPLANT
GLOVE BIOGEL PI INDICATOR 7.0 (GLOVE) ×5
GLOVE BIOGEL PI INDICATOR 7.5 (GLOVE) ×6
GLOVE ECLIPSE 8.0 STRL XLNG CF (GLOVE) ×4 IMPLANT
GLOVE INDICATOR 8.0 STRL GRN (GLOVE) ×4 IMPLANT
GLOVE SURG SS PI 7.0 STRL IVOR (GLOVE) ×8 IMPLANT
GOWN STRL REUS W/TWL 2XL LVL3 (GOWN DISPOSABLE) ×2 IMPLANT
GOWN STRL REUS W/TWL LRG LVL3 (GOWN DISPOSABLE) ×4 IMPLANT
GOWN STRL REUS W/TWL XL LVL3 (GOWN DISPOSABLE) ×18 IMPLANT
LEGGING LITHOTOMY PAIR STRL (DRAPES) ×2 IMPLANT
LIGASURE IMPACT 36 18CM CVD LR (INSTRUMENTS) IMPLANT
NS IRRIG 1000ML POUR BTL (IV SOLUTION) ×16 IMPLANT
PACK COLON (CUSTOM PROCEDURE TRAY) ×2 IMPLANT
PAD ABD 8X10 STRL (GAUZE/BANDAGES/DRESSINGS) ×2 IMPLANT
RTRCTR WOUND ALEXIS 18CM MED (MISCELLANEOUS)
SCISSORS LAP 5X35 DISP (ENDOMECHANICALS) ×2 IMPLANT
SET IRRIG TUBING LAPAROSCOPIC (IRRIGATION / IRRIGATOR) ×2 IMPLANT
SHEARS CURVED HARMONIC AC 45CM (MISCELLANEOUS) ×2 IMPLANT
SHEARS HARMONIC ACE PLUS 36CM (ENDOMECHANICALS) ×2 IMPLANT
SLEEVE XCEL OPT CAN 5 100 (ENDOMECHANICALS) ×8 IMPLANT
SOLUTION ANTI FOG 6CC (MISCELLANEOUS) ×2 IMPLANT
SPONGE GAUZE 2X2 STER 10/PKG (GAUZE/BANDAGES/DRESSINGS) ×1
SPONGE LAP 18X18 X RAY DECT (DISPOSABLE) ×6 IMPLANT
STAPLER CUT CVD 40MM BLUE (STAPLE) ×2 IMPLANT
STAPLER PROXIMATE 75MM BLUE (STAPLE) ×2 IMPLANT
STAPLER VISISTAT 35W (STAPLE) ×2 IMPLANT
SUCTION POOLE TIP (SUCTIONS) ×2 IMPLANT
SUT ETHILON 3 0 PS 1 (SUTURE) ×2 IMPLANT
SUT NOVA 1 T20/GS 25DT (SUTURE) ×2 IMPLANT
SUT PDS AB 1 CTX 36 (SUTURE) IMPLANT
SUT PDS AB 1 TP1 96 (SUTURE) ×8 IMPLANT
SUT PROLENE 2 0 SH DA (SUTURE) ×2 IMPLANT
SUT SILK 2 0 (SUTURE) ×1
SUT SILK 2 0 SH CR/8 (SUTURE) ×2 IMPLANT
SUT SILK 2-0 18XBRD TIE 12 (SUTURE) ×1 IMPLANT
SUT SILK 3 0 (SUTURE) ×1
SUT SILK 3 0 SH CR/8 (SUTURE) ×2 IMPLANT
SUT SILK 3-0 18XBRD TIE 12 (SUTURE) ×1 IMPLANT
SUT VICRYL 2 0 18  UND BR (SUTURE)
SUT VICRYL 2 0 18 UND BR (SUTURE) IMPLANT
SYS LAPSCP GELPORT 120MM (MISCELLANEOUS)
SYSTEM LAPSCP GELPORT 120MM (MISCELLANEOUS) IMPLANT
TAPE CLOTH SURG 4X10 WHT LF (GAUZE/BANDAGES/DRESSINGS) ×2 IMPLANT
TRAY FOLEY W/METER SILVER 14FR (SET/KITS/TRAYS/PACK) ×2 IMPLANT
TRAY FOLEY W/METER SILVER 16FR (SET/KITS/TRAYS/PACK) IMPLANT
TROCAR BLADELESS OPT 5 100 (ENDOMECHANICALS) ×2 IMPLANT
TROCAR BLADELESS OPT 5 75 (ENDOMECHANICALS) IMPLANT
TROCAR XCEL BLUNT TIP 100MML (ENDOMECHANICALS) IMPLANT
TROCAR XCEL NON-BLD 11X100MML (ENDOMECHANICALS) IMPLANT
TROCAR XCEL UNIV SLVE 11M 100M (ENDOMECHANICALS) IMPLANT
TUBING INSUFFLATION 10FT LAP (TUBING) IMPLANT
YANKAUER SUCT BULB TIP NO VENT (SUCTIONS) ×2 IMPLANT

## 2015-09-26 NOTE — Op Note (Signed)
Operative Note  Alison Best female 59 y.o. 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         Surgeon: Odis Hollingshead   Assistants: Autumn Messing M.D.; Leighton Ruff M.D.  Anesthesia: General endotracheal anesthesia  Indications:   This is a 59 year old female who underwent an emergency Hartmann procedure for perforated sigmoid colon diverticulitis 02/20/2015. She has recovered completely from this.  She now presents for laparoscopic assisted colostomy closure.    Procedure Detail:  She was brought to the operating room placed supine on the operating table and general anesthetic was given. She was placed in the lithotomy position. A Foley catheter was inserted. An oral gastric tube was inserted. The abdominal wall and perineal areas were sterilely prepped and draped. A Betadine soaked sponge was placed over the left-sided colostomy followed by a Tegaderm. A timeout was performed.  She is placed in slight reverse Trendelenburg. A 5 mm incision was made in the right upper quadrant. Using a 5 mm Optiview trocar and 5 mm laparoscope, access was gained into the peritoneal cavity and a pneumoperitoneum was created. Inspection of the area under the trocar demonstrated no evidence of bleeding or organ injury. Adhesions were noted between the omentum and anterior abdominal wall from the mid abdomen distally. A 5 mm trocar was placed in the right lateral abdomen. Using sharp and blunt dissection, the adhesions were mobilized free from the abdominal wall. Another 5 mm trocar was placed through the previous lower midline incision. A 5 mm trocar was placed in the left lateral abdomen. 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 This was reduced. I then mobilized the left colon from its lateral attachments  proximal to the colostomy site and also mobilized the splenic flexure using blunt dissection and the Harmonic scalpel.  Next, I inspected the pelvis. Small bowel was able to be retracted out of the pelvis. There appeared to be dense adhesions in the pelvis and I could not see the rectal stump marking sutures. Thus I did decided to open up the previous lower midline incision. This was done using the knife and electrocautery. A self-retaining retractor was placed. I identified the uterus and retracted it anteriorly. I then was able to faintly see a blue suture. The anterior aspect of the rectum was densely adherent to the uterus. I was able to develop a plane near the rectal stump on the right side but the left side was densely adherent to the lateral sidewall. I identified the right ureter and  Mobilized it free from these dense adhesions. I began developing a posterior plane posterior to the rectum. I was still having trouble with the left lateral plane. I was not sure I could proceed. I then partially closed the parastomal hernia with interrupted #1 Novafil sutures. Dr. Marcello Moores came in to the procedure and was able to find a posterior and left lateral plane. I then proceeded to work in both of these planes and free up the rectum. I identify the left ureter and traced it down into the pelvis keeping it free from the plane of dissection. Both ureters were inspected again and were intact.   Once this was done I could then free up the anterior rectum from the posterior uterus. EEA sizers were then passed up through the anus and were able to but  the rectal staple line which was identified. Subsequently, a #29 EEA stapler handler was also able to be brought out and I found an area in the anterior rectum that I could perform an anastomosis to.  An elliptical incision was made in the left upper quadrant skin around the colostomy and it was completely mobilized and dropped back into the abdominal cavity. There was  adequate mobilization to allow the colon to stretch into the pelvis. A size 29 EEA anvil was then placed into the descending colon and brought out the side. He was held in place with a 2-0 Prolene purse string suture. The colostomy and a small segment of colon were then resected using the GIA stapler.  A site descending colon to rectal anastomosis was then performed using the 29 EEA stapler. Anastomosis was under no tension. It was patent and viable. An airleak test was performed and no air leak was noted.  A 19 Blake drain was then placed through the right-sided trocar site. It was anchored to the skin at 3 and all nylon. It was placed in the pelvis. All other trocars were removed.   The abdominal cavity was copiously irrigated with saline solution and inspected and I saw no evidence of active bleeding or organ injury.  The fascia at the colostomy site was then closed with running looped #1 PDS suture. The fascial closure was solid. The lower midline incision fascia was closed with running looped #1 PDS suture. All needle, sponge, instrument counts were reported to be correct.  Remaining skin incisions were closed with staples. The colostomy site was packed with saline moistened gauze. The drain was hooked up to bulb suction. Dry dressings were applied to all sites.  She tolerated the procedure well without any apparent complications and was taken to the recovery room in satisfactory condition.   Estimated Blood Loss:  600 ml         Drains: #19 Blake drain in pelvis  Blood Given: none          Specimens: colostomy        Complications:  * No complications entered in OR log *         Disposition: PACU - hemodynamically stable.         Condition: stable

## 2015-09-26 NOTE — Anesthesia Procedure Notes (Signed)
Procedure Name: Intubation Date/Time: 09/26/2015 7:18 AM Performed by: Noralyn Pick D Pre-anesthesia Checklist: Patient identified, Emergency Drugs available, Suction available and Patient being monitored Patient Re-evaluated:Patient Re-evaluated prior to inductionOxygen Delivery Method: Circle System Utilized Preoxygenation: Pre-oxygenation with 100% oxygen Intubation Type: IV induction Ventilation: Mask ventilation without difficulty Laryngoscope Size: 3 and Mac Grade View: Grade II Tube type: Oral Tube size: 7.5 mm Number of attempts: 1 Airway Equipment and Method: Stylet and Oral airway Placement Confirmation: ETT inserted through vocal cords under direct vision,  positive ETCO2 and breath sounds checked- equal and bilateral Secured at: 19 cm Tube secured with: Tape Dental Injury: Teeth and Oropharynx as per pre-operative assessment

## 2015-09-26 NOTE — Transfer of Care (Signed)
Immediate Anesthesia Transfer of Care Note  Patient: Alison Best  Procedure(s) Performed: Procedure(s): LAPAROSCOPIC LYSIS OF ADHESIONS, COLOSTOMY CLOSURE (N/A)  Patient Location: PACU  Anesthesia Type:General  Level of Consciousness: awake, alert  and oriented  Airway & Oxygen Therapy: Patient Spontanous Breathing and Patient connected to face mask oxygen  Post-op Assessment: Report given to RN and Post -op Vital signs reviewed and stable  Post vital signs: Reviewed and stable  Last Vitals:  Filed Vitals:   09/26/15 0548 09/26/15 0553  BP: 134/84 117/55  Pulse: 81 63  Temp: 36.5 C 36.6 C  Resp: 16 18    Complications: No apparent anesthesia complications

## 2015-09-26 NOTE — H&P (Signed)
Alison Best is an 59 y.o. female.   Chief Complaint:   Here for elective colostomy closure. HPI:   She presents today for elective colostomy closure.  She underwent emergency exploratory laparotomy, drainiage of interabdominal abcesses, SALPINGO OOPHORECTOMY, COLOSTOMY, Sigmoid Colectomy, CYSTOSCOPY - 02/20/2015. She reports that her colonoscopy was less than 5 years ago and she was told to come back in 10 years. BE did not demonstrate any significant diverticular disease.  Past Medical History  Diagnosis Date  . Diverticulitis of colon 02/2015  . Obesity   . Hyperlipidemia   . Sleep apnea   . Heart murmur   . Irregular heartbeat   . Thyroid disease     hyperthyroid  . Bursitis   . Ectopic pregnancy   . Hyperthyroidism   . Arthritis     Past Surgical History  Procedure Laterality Date  . Colonoscopy  09-10-10    per Dr. Acquanetta Sit, diverticulosis of descending colon and sigmoid, internal hemorrhoids, repeat in 10 yrs  . Cesarean section    . Appendectomy    . Tonsillectomy  1980  . Tonsillectomy    . Laparotomy N/A 02/20/2015    Procedure: Emergency EXPLORATORY and drainiage of interabdominal abcesses;  Surgeon: Jackolyn Confer, MD;  Location: [facility];  Service: General;  Laterality: N/A;  . Salpingoophorectomy Left 02/20/2015    Procedure: SALPINGO OOPHORECTOMY;  Surgeon: Jackolyn Confer, MD;  Location: [facility];  Service: General;  Laterality: Left;  . Colostomy N/A 02/20/2015    Procedure: COLOSTOMY;  Surgeon: Jackolyn Confer, MD;  Location: [facility];  Service: General;  Laterality: N/A;  . Colostomy revision  02/20/2015    Procedure: Sigmoid Colectomy;  Surgeon: Jackolyn Confer, MD;  Location: [facility];  Service: General;;  . Cystoscopy N/A 02/20/2015    Procedure: CYSTOSCOPY;  Surgeon: Kathie Rhodes, MD;  Location: [facility];  Service: Urology;  Laterality: N/A;    Family History  Problem Relation Age of Onset  . Cancer Mother   . COPD Other   . Allergies Brother   . CAD  Father     CABG  . Cancer Maternal Grandmother   . Skin cancer Father   . Heart attack Maternal Grandfather   . Stroke Neg Hx   . Hypertension Neg Hx   . Heart attack Paternal Grandfather    Social History:  reports that she quit smoking about 7 months ago. Her smoking use included Cigarettes. She has a 10 pack-year smoking history. She has never used smokeless tobacco. She reports that she drinks alcohol. She reports that she does not use illicit drugs.   Prior to Admission medications   Medication Sig Start Date End Date Taking? Authorizing Provider  acetaminophen (TYLENOL) 325 MG tablet Take 2 tablets (650 mg total) by mouth every 6 (six) hours as needed for moderate pain or headache. 03/06/15  Yes Emina Riebock, NP  aspirin 81 MG tablet Take 81 mg by mouth daily.   Yes Historical Provider, MD  clotrimazole-betamethasone (LOTRISONE) cream Apply 1 application topically daily as needed (irritation around stoma site).  08/11/15  Yes Historical Provider, MD  methimazole (TAPAZOLE) 5 MG tablet Take 1 tablet (5 mg total) by mouth 2 (two) times daily. **PT NEEDS LABS FOR FURTHER REFILLS** 09/15/15  Yes Philemon Kingdom, MD  metoprolol succinate (TOPROL-XL) 50 MG 24 hr tablet Take 1 tablet (50 mg total) by mouth daily. Take with or immediately following a meal. 07/22/15  Yes Burnell Blanks, MD  Multiple Vitamins-Minerals (MULTIVITAMIN WITH MINERALS)  tablet Take 1 tablet by mouth daily.   Yes Historical Provider, MD  albuterol (PROAIR HFA) 108 (90 BASE) MCG/ACT inhaler Inhale 2 puffs into the lungs every 4 (four) hours as needed for wheezing. 04/22/14 05/24/16  Laurey Morale, MD  furosemide (LASIX) 20 MG tablet TAKE 1 TABLET BY MOUTH DAILY AS NEEDED (FLUID) 05/28/14   Laurey Morale, MD     Allergies:  Allergies  Allergen Reactions  . Bupropion Nausea And Vomiting  . Levaquin [Levofloxacin] Other (See Comments)    Severe tendonitis Tolerates Cipro    Medications Prior to Admission   Medication Sig Dispense Refill  . acetaminophen (TYLENOL) 325 MG tablet Take 2 tablets (650 mg total) by mouth every 6 (six) hours as needed for moderate pain or headache.    Marland Kitchen aspirin 81 MG tablet Take 81 mg by mouth daily.    . clotrimazole-betamethasone (LOTRISONE) cream Apply 1 application topically daily as needed (irritation around stoma site).   1  . methimazole (TAPAZOLE) 5 MG tablet Take 1 tablet (5 mg total) by mouth 2 (two) times daily. **PT NEEDS LABS FOR FURTHER REFILLS** 60 tablet 1  . metoprolol succinate (TOPROL-XL) 50 MG 24 hr tablet Take 1 tablet (50 mg total) by mouth daily. Take with or immediately following a meal. 30 tablet 5  . Multiple Vitamins-Minerals (MULTIVITAMIN WITH MINERALS) tablet Take 1 tablet by mouth daily.    Marland Kitchen albuterol (PROAIR HFA) 108 (90 BASE) MCG/ACT inhaler Inhale 2 puffs into the lungs every 4 (four) hours as needed for wheezing. 1 Inhaler 11  . furosemide (LASIX) 20 MG tablet TAKE 1 TABLET BY MOUTH DAILY AS NEEDED (FLUID) 30 tablet 11    Results for orders placed or performed during the hospital encounter of 09/26/15 (from the past 48 hour(s))  Type and screen [place]     Status: None (Preliminary result)   Collection Time: 09/26/15  6:50 AM  Result Value Ref Range   ABO/RH(D) O POS    Antibody Screen PENDING    Sample Expiration 09/29/2015    Unit Number [id number]    Blood Component Type RED CELLS,LR    Unit division 00    Status of Unit ALLOCATED    Transfusion Status OK TO TRANSFUSE    Crossmatch Result COMPATIBLE    Unit Number [id number]    Blood Component Type RBC LR PHER1    Unit division 00    Status of Unit ALLOCATED    Transfusion Status OK TO TRANSFUSE    Crossmatch Result COMPATIBLE    Dg Chest 2 View  09/24/2015  CLINICAL DATA:  Asthma. EXAM: CHEST  2 VIEW COMPARISON:  07/07/2015. FINDINGS: Mediastinum hilar structures normal. Lungs are clear. No pleural effusion or pneumothorax. Heart size  normal. No acute bony abnormality. IMPRESSION: No acute cardiopulmonary disease.  Exam stable from prior exams. Electronically Signed   By: Marcello Moores  Register   On: 09/24/2015 09:01    Review of Systems  Constitutional: Negative for fever and chills.  HENT: Positive for congestion.   Respiratory: Negative for cough.   Gastrointestinal: Negative for nausea, vomiting and abdominal pain.    Blood pressure 117/55, pulse 63, temperature 97.9 F (36.6 C), temperature source Oral, resp. rate 18, height 5' 0.5" (1.537 m), weight 85.531 kg (188 lb 9 oz), last menstrual period 02/23/2012, SpO2 98 %. Physical Exam  Constitutional:  Obese female in NAD  HENT:  Head: Normocephalic and atraumatic.  Cardiovascular: Normal rate and regular rhythm.  Respiratory: Effort normal and breath sounds normal.  GI: Soft. There is no tenderness.  Lower midline scar.  Left sided colostomy  Neurological: She is alert.  Skin: Skin is warm and dry.  Psychiatric: She has a normal mood and affect. Her behavior is normal.     Assessment/Plan Colostomy in place.  Plan: Lap assisted colostomy closure.  Jaquana Geiger J 09/26/2015, 7:21 AM

## 2015-09-26 NOTE — Anesthesia Postprocedure Evaluation (Signed)
Anesthesia Post Note  Patient: Alison Best  Procedure(s) Performed: Procedure(s) (LRB): LAPAROSCOPIC LYSIS OF ADHESIONS, COLOSTOMY CLOSURE (N/A)  Patient location during evaluation: PACU Anesthesia Type: General Level of consciousness: awake and alert Pain management: pain level controlled Vital Signs Assessment: post-procedure vital signs reviewed and stable Respiratory status: spontaneous breathing, nonlabored ventilation, respiratory function stable and patient connected to nasal cannula oxygen Cardiovascular status: blood pressure returned to baseline and stable Postop Assessment: no signs of nausea or vomiting Anesthetic complications: no    Last Vitals:  Filed Vitals:   09/26/15 1835 09/26/15 1947  BP: 100/81   Pulse: 82   Temp: 36.6 C   Resp: 17 17    Last Pain:  Filed Vitals:   09/26/15 1948  PainSc: 2                  Reigna Ruperto,JAMES TERRILL

## 2015-09-26 NOTE — Interval H&P Note (Signed)
History and Physical Interval Note:  09/26/2015 7:28 AM  Alison Best  has presented today for surgery, with the diagnosis of Colostomy in Place  The various methods of treatment have been discussed with the patient and family. After consideration of risks, benefits and other options for treatment, the patient has consented to  Procedure(s): LAPAROSCOPIC COLOSTOMY CLOSURE (N/A) as a surgical intervention .  The patient's history has been reviewed, patient examined, no change in status, stable for surgery.  I have reviewed the patient's chart and labs.  Questions were answered to the patient's satisfaction.     Arsenio Schnorr Lenna Sciara

## 2015-09-27 LAB — BASIC METABOLIC PANEL
Anion gap: 6 (ref 5–15)
BUN: 17 mg/dL (ref 6–20)
CALCIUM: 8.1 mg/dL — AB (ref 8.9–10.3)
CO2: 25 mmol/L (ref 22–32)
Chloride: 109 mmol/L (ref 101–111)
Creatinine, Ser: 0.96 mg/dL (ref 0.44–1.00)
GFR calc Af Amer: >60 mL/min (ref >60)
GLUCOSE: 139 mg/dL — AB (ref 65–99)
POTASSIUM: 4.8 mmol/L (ref 3.5–5.1)
SODIUM: 140 mmol/L (ref 135–145)

## 2015-09-27 LAB — CBC
HCT: 32.3 % — ABNORMAL LOW (ref 36.0–46.0)
Hemoglobin: 10.9 g/dL — ABNORMAL LOW (ref 12.0–15.0)
MCH: 30.5 pg (ref 26.0–34.0)
MCHC: 33.7 g/dL (ref 30.0–36.0)
MCV: 90.5 fL (ref 78.0–100.0)
Platelets: 244 10*3/uL (ref 150–400)
RBC: 3.57 MIL/uL — ABNORMAL LOW (ref 3.87–5.11)
RDW: 16.4 % — AB (ref 11.5–15.5)
WBC: 16.6 10*3/uL — ABNORMAL HIGH (ref 4.0–10.5)

## 2015-09-27 MED ORDER — CHLORHEXIDINE GLUCONATE 0.12 % MT SOLN
15.0000 mL | Freq: Four times a day (QID) | OROMUCOSAL | Status: DC
  Administered 2015-09-27 – 2015-09-29 (×10): 15 mL via OROMUCOSAL
  Filled 2015-09-27 (×16): qty 15

## 2015-09-27 MED ORDER — INFLUENZA VAC SPLIT QUAD 0.5 ML IM SUSY
0.5000 mL | PREFILLED_SYRINGE | INTRAMUSCULAR | Status: DC
  Filled 2015-09-27 (×2): qty 0.5

## 2015-09-27 NOTE — Progress Notes (Signed)
Patient ID: Alison Best, female   DOB: 02/23/1956, 59 y.o.   MRN: [id number]  [facility]  POD#: 1  Subjective: Patient in bed.  Pain controlled with PCA.  Has not been up yet.  No nausea.  Objective: Vital signs in last 24 hours: Temp:  [97.5 F (36.4 C)-98.7 F (37.1 C)] 98 F (36.7 C) (12/03 0600) Pulse Rate:  [65-85] 80 (12/03 0600) Resp:  [7-18] 16 (12/03 0600) BP: (91-125)/(45-81) 105/47 mmHg (12/03 0600) SpO2:  [95 %-100 %] 98 % (12/03 0600) FiO2 (%):  [41 %-47 %] 45 % (12/03 0407)    Intake/Output from previous day: 12/02 0701 - 12/03 0700 In: 5400 [I.V.:5400] Out: 1465 [Urine:625; Drains:240; Blood:600] Intake/Output this shift:    Physical Exam: HEENT - sclerae clear, mucous membranes moist Neck - soft Chest - clear bilaterally Cor - RRR Abdomen - soft, obese; few BS present; dressings dry and intact; drain with serosanguinous Ext - no edema, non-tender Neuro - alert & oriented, no focal deficits  Lab Results:   Recent Labs  09/27/15 0520  WBC 16.6*  HGB 10.9*  HCT 32.3*  PLT 244   BMET  Recent Labs  09/26/15 0650 09/27/15 0520  NA 140 140  K 3.9 4.8  CL 105 109  CO2 25 25  GLUCOSE 112* 139*  BUN 11 17  CREATININE 0.81 0.96  CALCIUM 9.2 8.1*   PT/INR No results for input(s): LABPROT, INR in the last 72 hours. Comprehensive Metabolic Panel:    Component Value Date/Time   NA 140 09/27/2015 0520   NA 140 09/26/2015 0650   K 4.8 09/27/2015 0520   K 3.9 09/26/2015 0650   CL 109 09/27/2015 0520   CL 105 09/26/2015 0650   CO2 25 09/27/2015 0520   CO2 25 09/26/2015 0650   BUN 17 09/27/2015 0520   BUN 11 09/26/2015 0650   CREATININE 0.96 09/27/2015 0520   CREATININE 0.81 09/26/2015 0650   GLUCOSE 139* 09/27/2015 0520   GLUCOSE 112* 09/26/2015 0650   CALCIUM 8.1* 09/27/2015 0520   CALCIUM 9.2 09/26/2015 0650   AST 29 09/26/2015 0650   AST 14* 03/03/2015 0600   ALT 28 09/26/2015 0650   ALT  12* 03/03/2015 0600   ALKPHOS 151* 09/26/2015 0650   ALKPHOS 76 03/03/2015 0600   BILITOT 0.7 09/26/2015 0650   BILITOT 0.8 03/03/2015 0600   PROT 7.7 09/26/2015 0650   PROT 5.3* 03/03/2015 0600   ALBUMIN 4.1 09/26/2015 0650   ALBUMIN 2.2* 03/03/2015 0600    Studies/Results: No results found.  Anti-infectives: Anti-infectives    Start     Dose/Rate Route Frequency Ordered Stop   09/26/15 2000  cefoTEtan (CEFOTAN) 2 g in dextrose 5 % 50 mL IVPB     2 g 100 mL/hr over 30 Minutes Intravenous Every 12 hours 09/26/15 1405 09/26/15 2031   09/26/15 0635  cefoTEtan in Dextrose 5% (CEFOTAN) IVPB 2 g     2 g Intravenous On call to O.R. 09/26/15 0635 09/26/15 0753      Assessment & Plans: Status post colostomy closure  Begin clear liquid diet  OOB to chair today, ambulate in room  Leave Foley until tomorrow  PCA for pain control  Earnstine Regal, MD, [facility] Office: [place] 09/27/2015

## 2015-09-27 NOTE — Progress Notes (Signed)
Pt sat up in chair from 1000 am to 1540. Ambulated about 150 feet after sitting up in the chair and prior to getting back in bed. Will cont to monitor. Vwilliams, rn.

## 2015-09-28 NOTE — Progress Notes (Signed)
Patient ID: Alison Best, female   DOB: December 09, 1955, 59 y.o.   MRN: [id number]  [facility]  POD#: 2  Subjective: Patient in bed, tolerating clear liquids.  Limited time out of bed.  Objective: Vital signs in last 24 hours: Temp:  [97.7 F (36.5 C)-98.3 F (36.8 C)] 98.2 F (36.8 C) (12/04 0600) Pulse Rate:  [71-89] 89 (12/04 0600) Resp:  [14-18] 14 (12/04 0800) BP: (96-153)/(26-59) 96/45 mmHg (12/04 0601) SpO2:  [93 %-100 %] 97 % (12/04 0800) Last BM Date: 09/25/15 (via colostomy)  Intake/Output from previous day: 12/03 0701 - 12/04 0700 In: 2614.2 [P.O.:120; I.V.:2494.2] Out: 2380 [Urine:2250; Drains:130] Intake/Output this shift:    Physical Exam: HEENT - sclerae clear, mucous membranes moist Neck - soft Chest - clear bilaterally Cor - RRR Abdomen - soft, obese; few BS present; dressing changed - midline wound dry and intact; JP with serosanguinous Ext - no edema, non-tender Neuro - alert & oriented, no focal deficits  Lab Results:   Recent Labs  09/27/15 0520  WBC 16.6*  HGB 10.9*  HCT 32.3*  PLT 244   BMET  Recent Labs  09/26/15 0650 09/27/15 0520  NA 140 140  K 3.9 4.8  CL 105 109  CO2 25 25  GLUCOSE 112* 139*  BUN 11 17  CREATININE 0.81 0.96  CALCIUM 9.2 8.1*   PT/INR No results for input(s): LABPROT, INR in the last 72 hours. Comprehensive Metabolic Panel:    Component Value Date/Time   NA 140 09/27/2015 0520   NA 140 09/26/2015 0650   K 4.8 09/27/2015 0520   K 3.9 09/26/2015 0650   CL 109 09/27/2015 0520   CL 105 09/26/2015 0650   CO2 25 09/27/2015 0520   CO2 25 09/26/2015 0650   BUN 17 09/27/2015 0520   BUN 11 09/26/2015 0650   CREATININE 0.96 09/27/2015 0520   CREATININE 0.81 09/26/2015 0650   GLUCOSE 139* 09/27/2015 0520   GLUCOSE 112* 09/26/2015 0650   CALCIUM 8.1* 09/27/2015 0520   CALCIUM 9.2 09/26/2015 0650   AST 29 09/26/2015 0650   AST 14* 03/03/2015 0600   ALT 28 09/26/2015  0650   ALT 12* 03/03/2015 0600   ALKPHOS 151* 09/26/2015 0650   ALKPHOS 76 03/03/2015 0600   BILITOT 0.7 09/26/2015 0650   BILITOT 0.8 03/03/2015 0600   PROT 7.7 09/26/2015 0650   PROT 5.3* 03/03/2015 0600   ALBUMIN 4.1 09/26/2015 0650   ALBUMIN 2.2* 03/03/2015 0600    Studies/Results: No results found.  Anti-infectives: Anti-infectives    Start     Dose/Rate Route Frequency Ordered Stop   09/26/15 2000  cefoTEtan (CEFOTAN) 2 g in dextrose 5 % 50 mL IVPB     2 g 100 mL/hr over 30 Minutes Intravenous Every 12 hours 09/26/15 1405 09/26/15 2031   09/26/15 0635  cefoTEtan in Dextrose 5% (CEFOTAN) IVPB 2 g     2 g Intravenous On call to O.R. 09/26/15 0635 09/26/15 0753      Assessment & Plans: Status post colostomy closure Continue clear liquid diet OOB to chair today, ambulate Foley out PCA for pain control  Earnstine Regal, MD, [facility] Office: [place] 09/28/2015

## 2015-09-28 NOTE — Progress Notes (Signed)
Utilization Review Completed.Alison Best T12/01/2015  

## 2015-09-28 NOTE — Progress Notes (Signed)
Pharmacy Brief Note - Alvimopan (Entereg)  The standing order set for alvimopan (Entereg) now includes an automatic order to discontinue the drug after the patient has had a bowel movement. The change was approved by the [place] and the Medical Executive Committee.   This patient has had bowel movements documented by nursing. Therefore, alvimopan has been discontinued. If there are questions, please contact the pharmacy at [phone].   Thank you-  Dolly Rias RPh 09/28/2015, 10:54 AM Pager [phone]

## 2015-09-28 NOTE — Progress Notes (Signed)
Pt has been more sleepy and lethargic today than yesterday. Unable to walk more than 50 feet. C/o of "not feeling myself" when up walking. Unable to stay awake for a longer period of time. Pt encouraged to stay off PCA as much as she can tolerate.  Will cont to monitor pt. Vwilliams,rn.

## 2015-09-29 MED ORDER — OXYCODONE HCL 5 MG PO TABS
5.0000 mg | ORAL_TABLET | ORAL | Status: DC | PRN
  Administered 2015-09-29: 10 mg via ORAL
  Administered 2015-09-29: 5 mg via ORAL
  Administered 2015-09-29 – 2015-09-30 (×7): 10 mg via ORAL
  Administered 2015-10-01: 5 mg via ORAL
  Administered 2015-10-01 (×3): 10 mg via ORAL
  Administered 2015-10-01: 5 mg via ORAL
  Administered 2015-10-01 – 2015-10-07 (×30): 10 mg via ORAL
  Filled 2015-09-29 (×4): qty 2
  Filled 2015-09-29: qty 1
  Filled 2015-09-29 (×5): qty 2
  Filled 2015-09-29: qty 1
  Filled 2015-09-29 (×9): qty 2
  Filled 2015-09-29: qty 1
  Filled 2015-09-29 (×22): qty 2
  Filled 2015-09-29: qty 1
  Filled 2015-09-29: qty 2

## 2015-09-29 MED ORDER — MORPHINE SULFATE (PF) 2 MG/ML IV SOLN
2.0000 mg | INTRAVENOUS | Status: DC | PRN
  Filled 2015-09-29: qty 1

## 2015-09-29 NOTE — Care Management Note (Signed)
Case Management Note  Patient Details  Name: LOWEN FELLAND MRN: [id number] Date of Birth: 18-Dec-1955  Subjective/Objective:     s/p colostomy reversal                Action/Plan: Discharge planning, no HH needs identified  Expected Discharge Date:  10/03/15               Expected Discharge Plan:  Home/Self Care  In-House Referral:  NA  Discharge planning Services  CM Consult  Post Acute Care Choice:  NA Choice offered to:  NA  DME Arranged:  N/A DME Agency:  NA  HH Arranged:  NA HH Agency:  NA  Status of Service:  Completed, signed off  Medicare Important Message Given:    Date Medicare IM Given:    Medicare IM give by:    Date Additional Medicare IM Given:    Additional Medicare Important Message give by:     If discussed at [place] of Stay Meetings, dates discussed:    Additional Comments:  Guadalupe Maple, RN 09/29/2015, 2:01 PM

## 2015-09-29 NOTE — Progress Notes (Signed)
3 Days Post-Op  Subjective: Feels better today.  Passed a lot of gas yesterday.  Less distended.  Bowels moving.  Objective: Vital signs in last 24 hours: Temp:  [98.3 F (36.8 C)-99.8 F (37.7 C)] 98.4 F (36.9 C) (12/05 0619) Pulse Rate:  [65-84] 65 (12/05 0619) Resp:  [10-14] 10 (12/05 0622) BP: (93-112)/(41-66) 112/46 mmHg (12/05 0619) SpO2:  [96 %-100 %] 97 % (12/05 0622) Last BM Date: 09/28/15  Intake/Output from previous day: 12/04 0701 - 12/05 0700 In: 2531.7 [P.O.:480; I.V.:2051.7] Out: 965 [Urine:850; Drains:115] Intake/Output this shift: Total I/O In: -  Out: 400 [Stool:400]  PE: General- In NAD Abdomen-soft, incisions clean and intact, open L sided wound clean, serosanguinous drain output  Lab Results:   Recent Labs  09/27/15 0520  WBC 16.6*  HGB 10.9*  HCT 32.3*  PLT 244   BMET  Recent Labs  09/27/15 0520  NA 140  K 4.8  CL 109  CO2 25  GLUCOSE 139*  BUN 17  CREATININE 0.96  CALCIUM 8.1*   PT/INR No results for input(s): LABPROT, INR in the last 72 hours. Comprehensive Metabolic Panel:    Component Value Date/Time   NA 140 09/27/2015 0520   NA 140 09/26/2015 0650   K 4.8 09/27/2015 0520   K 3.9 09/26/2015 0650   CL 109 09/27/2015 0520   CL 105 09/26/2015 0650   CO2 25 09/27/2015 0520   CO2 25 09/26/2015 0650   BUN 17 09/27/2015 0520   BUN 11 09/26/2015 0650   CREATININE 0.96 09/27/2015 0520   CREATININE 0.81 09/26/2015 0650   GLUCOSE 139* 09/27/2015 0520   GLUCOSE 112* 09/26/2015 0650   CALCIUM 8.1* 09/27/2015 0520   CALCIUM 9.2 09/26/2015 0650   AST 29 09/26/2015 0650   AST 14* 03/03/2015 0600   ALT 28 09/26/2015 0650   ALT 12* 03/03/2015 0600   ALKPHOS 151* 09/26/2015 0650   ALKPHOS 76 03/03/2015 0600   BILITOT 0.7 09/26/2015 0650   BILITOT 0.8 03/03/2015 0600   PROT 7.7 09/26/2015 0650   PROT 5.3* 03/03/2015 0600   ALBUMIN 4.1 09/26/2015 0650   ALBUMIN 2.2* 03/03/2015 0600     Studies/Results: No results  found.  Anti-infectives: Anti-infectives    Start     Dose/Rate Route Frequency Ordered Stop   09/26/15 2000  cefoTEtan (CEFOTAN) 2 g in dextrose 5 % 50 mL IVPB     2 g 100 mL/hr over 30 Minutes Intravenous Every 12 hours 09/26/15 1405 09/26/15 2031   09/26/15 0635  cefoTEtan in Dextrose 5% (CEFOTAN) IVPB 2 g     2 g Intravenous On call to O.R. 09/26/15 0635 09/26/15 0753      Assessment Principal Problem:   Colostomy in place [facility]) s/p colostomy reversal 09/26/15-bowel function returning Active Problems:    Hyperthyroidism-controlled with Tapazole and Toprol    LOS: 3 days   Plan: D/C PCA, oral analgesic.  Full liquid diet.  Daily dressing changes.   Tariyah Pendry J 09/29/2015

## 2015-09-30 LAB — TYPE AND SCREEN
ABO/RH(D): O POS
Antibody Screen: POSITIVE
DAT, IGG: NEGATIVE
UNIT DIVISION: 0
UNIT DIVISION: 0

## 2015-09-30 NOTE — Progress Notes (Signed)
4 Days Post-Op  Subjective: Tolerating full liquid diet.  Bowels continue to move.  Objective: Vital signs in last 24 hours: Temp:  [99.1 F (37.3 C)-99.6 F (37.6 C)] 99.1 F (37.3 C) (12/06 0630) Pulse Rate:  [63-80] 80 (12/06 0914) Resp:  [12-18] 18 (12/06 0630) BP: (102-117)/(45-61) 117/56 mmHg (12/06 0914) SpO2:  [97 %-98 %] 97 % (12/06 0630) Last BM Date: 09/28/15  Intake/Output from previous day: 12/05 0701 - 12/06 0700 In: 1440 [P.O.:1440] Out: 81 [Urine:4850; Drains:245; Stool:401] Intake/Output this shift: Total I/O In: -  Out: 350 [Urine:300; Drains:50]  PE: General- In NAD Abdomen-soft, incisions clean and intact, open L sided wound clean, serous drain output  Lab Results:  No results for input(s): WBC, HGB, HCT, PLT in the last 72 hours. BMET No results for input(s): NA, K, CL, CO2, GLUCOSE, BUN, CREATININE, CALCIUM in the last 72 hours. PT/INR No results for input(s): LABPROT, INR in the last 72 hours. Comprehensive Metabolic Panel:    Component Value Date/Time   NA 140 09/27/2015 0520   NA 140 09/26/2015 0650   K 4.8 09/27/2015 0520   K 3.9 09/26/2015 0650   CL 109 09/27/2015 0520   CL 105 09/26/2015 0650   CO2 25 09/27/2015 0520   CO2 25 09/26/2015 0650   BUN 17 09/27/2015 0520   BUN 11 09/26/2015 0650   CREATININE 0.96 09/27/2015 0520   CREATININE 0.81 09/26/2015 0650   GLUCOSE 139* 09/27/2015 0520   GLUCOSE 112* 09/26/2015 0650   CALCIUM 8.1* 09/27/2015 0520   CALCIUM 9.2 09/26/2015 0650   AST 29 09/26/2015 0650   AST 14* 03/03/2015 0600   ALT 28 09/26/2015 0650   ALT 12* 03/03/2015 0600   ALKPHOS 151* 09/26/2015 0650   ALKPHOS 76 03/03/2015 0600   BILITOT 0.7 09/26/2015 0650   BILITOT 0.8 03/03/2015 0600   PROT 7.7 09/26/2015 0650   PROT 5.3* 03/03/2015 0600   ALBUMIN 4.1 09/26/2015 0650   ALBUMIN 2.2* 03/03/2015 0600     Studies/Results: No results found.  Anti-infectives: Anti-infectives    Start     Dose/Rate Route  Frequency Ordered Stop   09/26/15 2000  cefoTEtan (CEFOTAN) 2 g in dextrose 5 % 50 mL IVPB     2 g 100 mL/hr over 30 Minutes Intravenous Every 12 hours 09/26/15 1405 09/26/15 2031   09/26/15 0635  cefoTEtan in Dextrose 5% (CEFOTAN) IVPB 2 g     2 g Intravenous On call to O.R. 09/26/15 0635 09/26/15 0753      Assessment Principal Problem:   Colostomy in place [facility]) s/p colostomy reversal 09/26/15-progressing well Active Problems:    Hyperthyroidism-controlled with Tapazole and Toprol    LOS: 4 days   Plan: Decrease IVF.  Advance to solid diet.   Cheyrl Buley J 09/30/2015

## 2015-10-01 LAB — BASIC METABOLIC PANEL
Anion gap: 5 (ref 5–15)
CALCIUM: 8.2 mg/dL — AB (ref 8.9–10.3)
CHLORIDE: 103 mmol/L (ref 101–111)
CO2: 33 mmol/L — AB (ref 22–32)
CREATININE: 0.69 mg/dL (ref 0.44–1.00)
GFR calc non Af Amer: >60 mL/min (ref >60)
GLUCOSE: 96 mg/dL (ref 65–99)
Potassium: 3.2 mmol/L — ABNORMAL LOW (ref 3.5–5.1)
Sodium: 141 mmol/L (ref 135–145)

## 2015-10-01 LAB — CBC
HCT: 29 % — ABNORMAL LOW (ref 36.0–46.0)
Hemoglobin: 9.7 g/dL — ABNORMAL LOW (ref 12.0–15.0)
MCH: 30.4 pg (ref 26.0–34.0)
MCHC: 33.4 g/dL (ref 30.0–36.0)
MCV: 90.9 fL (ref 78.0–100.0)
PLATELETS: 339 10*3/uL (ref 150–400)
RBC: 3.19 MIL/uL — AB (ref 3.87–5.11)
RDW: 15.9 % — ABNORMAL HIGH (ref 11.5–15.5)
WBC: 9.2 10*3/uL (ref 4.0–10.5)

## 2015-10-01 MED ORDER — KCL IN DEXTROSE-NACL 20-5-0.9 MEQ/L-%-% IV SOLN
INTRAVENOUS | Status: DC
  Administered 2015-10-01 – 2015-10-04 (×4): via INTRAVENOUS
  Filled 2015-10-01 (×6): qty 1000

## 2015-10-01 NOTE — Progress Notes (Signed)
5 Days Post-Op  Subjective: Feels more bloated today.  No flatus. BM yesterday  Objective: Vital signs in last 24 hours: Temp:  [98.7 F (37.1 C)-99.3 F (37.4 C)] 99 F (37.2 C) (12/07 0600) Pulse Rate:  [67-83] 67 (12/07 0600) Resp:  [14-183] 14 (12/07 0600) BP: (116-127)/(56-64) 116/57 mmHg (12/07 0600) SpO2:  [93 %-98 %] 97 % (12/07 0600) Last BM Date: 09/30/15  Intake/Output from previous day: 12/06 0701 - 12/07 0700 In: 2806.5 [P.O.:240; I.V.:2566.5] Out: 2867 [Urine:2650; Drains:215; Stool:2] Intake/Output this shift:    PE: General- In NAD Abdomen-soft, some distension, hypoactive bowel sounds, slight pink color around open left sided wound which has granulation tissue forming and no purulent drainage  Lab Results:  No results for input(s): WBC, HGB, HCT, PLT in the last 72 hours. BMET No results for input(s): NA, K, CL, CO2, GLUCOSE, BUN, CREATININE, CALCIUM in the last 72 hours. PT/INR No results for input(s): LABPROT, INR in the last 72 hours. Comprehensive Metabolic Panel:    Component Value Date/Time   NA 140 09/27/2015 0520   NA 140 09/26/2015 0650   K 4.8 09/27/2015 0520   K 3.9 09/26/2015 0650   CL 109 09/27/2015 0520   CL 105 09/26/2015 0650   CO2 25 09/27/2015 0520   CO2 25 09/26/2015 0650   BUN 17 09/27/2015 0520   BUN 11 09/26/2015 0650   CREATININE 0.96 09/27/2015 0520   CREATININE 0.81 09/26/2015 0650   GLUCOSE 139* 09/27/2015 0520   GLUCOSE 112* 09/26/2015 0650   CALCIUM 8.1* 09/27/2015 0520   CALCIUM 9.2 09/26/2015 0650   AST 29 09/26/2015 0650   AST 14* 03/03/2015 0600   ALT 28 09/26/2015 0650   ALT 12* 03/03/2015 0600   ALKPHOS 151* 09/26/2015 0650   ALKPHOS 76 03/03/2015 0600   BILITOT 0.7 09/26/2015 0650   BILITOT 0.8 03/03/2015 0600   PROT 7.7 09/26/2015 0650   PROT 5.3* 03/03/2015 0600   ALBUMIN 4.1 09/26/2015 0650   ALBUMIN 2.2* 03/03/2015 0600     Studies/Results: No results found.  Anti-infectives: Anti-infectives     Start     Dose/Rate Route Frequency Ordered Stop   09/26/15 2000  cefoTEtan (CEFOTAN) 2 g in dextrose 5 % 50 mL IVPB     2 g 100 mL/hr over 30 Minutes Intravenous Every 12 hours 09/26/15 1405 09/26/15 2031   09/26/15 0635  cefoTEtan in Dextrose 5% (CEFOTAN) IVPB 2 g     2 g Intravenous On call to O.R. 09/26/15 0635 09/26/15 0753      Assessment Principal Problem:   Colostomy in place [facility]) s/p colostomy reversal 09/26/15-some ileus is still present Active Problems:   Hyperthyroidism-on Tapazole and Toprol    LOS: 5 days   Plan: Decrease diet to full liquids.  Continue IVF.  Wait for ileus to completely resolve.   Chyla Schlender J 10/01/2015

## 2015-10-02 MED ORDER — POTASSIUM CHLORIDE CRYS ER 20 MEQ PO TBCR
40.0000 meq | EXTENDED_RELEASE_TABLET | Freq: Two times a day (BID) | ORAL | Status: AC
  Administered 2015-10-02 (×2): 40 meq via ORAL
  Filled 2015-10-02 (×2): qty 2

## 2015-10-02 NOTE — Progress Notes (Signed)
6 Days Post-Op  Subjective: Feels better today.  Passing gas.  Bowels moving more.  Objective: Vital signs in last 24 hours: Temp:  [98.2 F (36.8 C)-99.3 F (37.4 C)] 98.2 F (36.8 C) (12/08 0526) Pulse Rate:  [49-103] 49 (12/08 0526) Resp:  [14-16] 15 (12/08 0526) BP: (113-139)/(71-79) 115/73 mmHg (12/08 0526) SpO2:  [98 %-100 %] 100 % (12/08 0526) Last BM Date: 09/30/15  Intake/Output from previous day: 12/07 0701 - 12/08 0700 In: 1200 [P.O.:840; I.V.:300] Out: 3140 [Urine:3050; Drains:90] Intake/Output this shift: Total I/O In: -  Out: 475 [Urine:475]  PE: General- In NAD Abdomen-soft, slight distension, sluggish bowel sounds open wound clean, other wounds clean, no signs of wound infection  Lab Results:   Recent Labs  10/01/15 0938  WBC 9.2  HGB 9.7*  HCT 29.0*  PLT 339   BMET  Recent Labs  10/01/15 0938  NA 141  K 3.2*  CL 103  CO2 33*  GLUCOSE 96  BUN <5*  CREATININE 0.69  CALCIUM 8.2*   PT/INR No results for input(s): LABPROT, INR in the last 72 hours. Comprehensive Metabolic Panel:    Component Value Date/Time   NA 141 10/01/2015 0938   NA 140 09/27/2015 0520   K 3.2* 10/01/2015 0938   K 4.8 09/27/2015 0520   CL 103 10/01/2015 0938   CL 109 09/27/2015 0520   CO2 33* 10/01/2015 0938   CO2 25 09/27/2015 0520   BUN <5* 10/01/2015 0938   BUN 17 09/27/2015 0520   CREATININE 0.69 10/01/2015 0938   CREATININE 0.96 09/27/2015 0520   GLUCOSE 96 10/01/2015 0938   GLUCOSE 139* 09/27/2015 0520   CALCIUM 8.2* 10/01/2015 0938   CALCIUM 8.1* 09/27/2015 0520   AST 29 09/26/2015 0650   AST 14* 03/03/2015 0600   ALT 28 09/26/2015 0650   ALT 12* 03/03/2015 0600   ALKPHOS 151* 09/26/2015 0650   ALKPHOS 76 03/03/2015 0600   BILITOT 0.7 09/26/2015 0650   BILITOT 0.8 03/03/2015 0600   PROT 7.7 09/26/2015 0650   PROT 5.3* 03/03/2015 0600   ALBUMIN 4.1 09/26/2015 0650   ALBUMIN 2.2* 03/03/2015 0600     Studies/Results: No results  found.  Anti-infectives: Anti-infectives    Start     Dose/Rate Route Frequency Ordered Stop   09/26/15 2000  cefoTEtan (CEFOTAN) 2 g in dextrose 5 % 50 mL IVPB     2 g 100 mL/hr over 30 Minutes Intravenous Every 12 hours 09/26/15 1405 09/26/15 2031   09/26/15 0635  cefoTEtan in Dextrose 5% (CEFOTAN) IVPB 2 g     2 g Intravenous On call to O.R. 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  Try soft diet again.  Repeat lab in AM.   Gustin Zobrist J 10/02/2015

## 2015-10-03 ENCOUNTER — Inpatient Hospital Stay (HOSPITAL_COMMUNITY): Payer: BLUE CROSS/BLUE SHIELD

## 2015-10-03 LAB — CBC
HCT: 27.8 % — ABNORMAL LOW (ref 36.0–46.0)
HEMOGLOBIN: 9.4 g/dL — AB (ref 12.0–15.0)
MCH: 31.1 pg (ref 26.0–34.0)
MCHC: 33.8 g/dL (ref 30.0–36.0)
MCV: 92.1 fL (ref 78.0–100.0)
PLATELETS: 395 10*3/uL (ref 150–400)
RBC: 3.02 MIL/uL — ABNORMAL LOW (ref 3.87–5.11)
RDW: 16.7 % — ABNORMAL HIGH (ref 11.5–15.5)
WBC: 13 10*3/uL — ABNORMAL HIGH (ref 4.0–10.5)

## 2015-10-03 LAB — BASIC METABOLIC PANEL
ANION GAP: 6 (ref 5–15)
BUN: <5 mg/dL — ABNORMAL LOW (ref 6–20)
CALCIUM: 8.1 mg/dL — AB (ref 8.9–10.3)
CO2: 29 mmol/L (ref 22–32)
Chloride: 103 mmol/L (ref 101–111)
Creatinine, Ser: 0.77 mg/dL (ref 0.44–1.00)
Glucose, Bld: 111 mg/dL — ABNORMAL HIGH (ref 65–99)
POTASSIUM: 4.2 mmol/L (ref 3.5–5.1)
Sodium: 138 mmol/L (ref 135–145)

## 2015-10-03 MED ORDER — IOHEXOL 300 MG/ML  SOLN: 25.0000 mL | INTRAMUSCULAR | Status: DC

## 2015-10-03 MED ORDER — IOHEXOL 300 MG/ML  SOLN
100.0000 mL | Freq: Once | INTRAMUSCULAR | Status: AC | PRN
  Administered 2015-10-03: 100 mL via INTRAVENOUS

## 2015-10-03 MED ORDER — IOHEXOL 300 MG/ML  SOLN: 25.0000 mL | INTRAMUSCULAR | Status: AC

## 2015-10-03 MED ORDER — OXYCODONE HCL 5 MG PO TABS: 5.0000 mg | ORAL_TABLET | ORAL | Status: DC | PRN

## 2015-10-03 MED ORDER — PANTOPRAZOLE SODIUM 40 MG PO TBEC
40.0000 mg | DELAYED_RELEASE_TABLET | Freq: Every day | ORAL | Status: DC
  Administered 2015-10-04 – 2015-10-07 (×4): 40 mg via ORAL
  Filled 2015-10-03 (×5): qty 1

## 2015-10-03 MED ORDER — PIPERACILLIN-TAZOBACTAM 3.375 G IVPB
3.3750 g | Freq: Three times a day (TID) | INTRAVENOUS | Status: DC
  Administered 2015-10-03 – 2015-10-07 (×11): 3.375 g via INTRAVENOUS
  Filled 2015-10-03 (×13): qty 50

## 2015-10-03 NOTE — Progress Notes (Signed)
WBC up.  CT demonstrates no evidence of intraabdominal infection.  There is a 6 cm fluid collection in the subcutaneous tissues in the LLQ adjacent to the open wound which could be an abscess.  I attempted to drain this through the wound at the bedside and drained bloody fluid.  There is also some erythema just lateral to the open colostomy site wound.  Will start IV abxs and if temperature and WBC go down, can switch to oral and discharge her.

## 2015-10-03 NOTE — Progress Notes (Signed)
7 Days Post-Op  Subjective: Feels even better today than she did yesterday.  Tolerating some of her soft food.  Does have a headache with greenish nasal drainage.  Walking.  Objective: Vital signs in last 24 hours: Temp:  [99.1 F (37.3 C)-101.1 F (38.4 C)] 99.4 F (37.4 C) (12/09 0451) Pulse Rate:  [72-102] 72 (12/09 0451) Resp:  [15-16] 16 (12/09 0451) BP: (102-124)/(42-56) 102/46 mmHg (12/09 0451) SpO2:  [94 %-100 %] 97 % (12/09 0451) Last BM Date: 10/02/15  Intake/Output from previous day: 12/08 0701 - 12/09 0700 In: 1685 [I.V.:1685] Out: 2856 [Urine:2750; Drains:105; Stool:1] Intake/Output this shift:    PE: General- In NAD Abdomen-soft, no distension, open wound clean, other wounds clean, no signs of wound infection  Lab Results:   Recent Labs  10/01/15 0938  WBC 9.2  HGB 9.7*  HCT 29.0*  PLT 339   BMET  Recent Labs  10/01/15 0938 10/03/15 0418  NA 141 138  K 3.2* 4.2  CL 103 103  CO2 33* 29  GLUCOSE 96 111*  BUN <5* <5*  CREATININE 0.69 0.77  CALCIUM 8.2* 8.1*   PT/INR No results for input(s): LABPROT, INR in the last 72 hours. Comprehensive Metabolic Panel:    Component Value Date/Time   NA 138 10/03/2015 0418   NA 141 10/01/2015 0938   K 4.2 10/03/2015 0418   K 3.2* 10/01/2015 0938   CL 103 10/03/2015 0418   CL 103 10/01/2015 0938   CO2 29 10/03/2015 0418   CO2 33* 10/01/2015 0938   BUN <5* 10/03/2015 0418   BUN <5* 10/01/2015 0938   CREATININE 0.77 10/03/2015 0418   CREATININE 0.69 10/01/2015 0938   GLUCOSE 111* 10/03/2015 0418   GLUCOSE 96 10/01/2015 0938   CALCIUM 8.1* 10/03/2015 0418   CALCIUM 8.2* 10/01/2015 0938   AST 29 09/26/2015 0650   AST 14* 03/03/2015 0600   ALT 28 09/26/2015 0650   ALT 12* 03/03/2015 0600   ALKPHOS 151* 09/26/2015 0650   ALKPHOS 76 03/03/2015 0600   BILITOT 0.7 09/26/2015 0650   BILITOT 0.8 03/03/2015 0600   PROT 7.7 09/26/2015 0650   PROT 5.3* 03/03/2015 0600   ALBUMIN 4.1 09/26/2015 0650   ALBUMIN 2.2* 03/03/2015 0600     Studies/Results: No results found.  Anti-infectives: Anti-infectives    Start     Dose/Rate Route Frequency Ordered Stop   09/26/15 2000  cefoTEtan (CEFOTAN) 2 g in dextrose 5 % 50 mL IVPB     2 g 100 mL/hr over 30 Minutes Intravenous Every 12 hours 09/26/15 1405 09/26/15 2031   09/26/15 0635  cefoTEtan in Dextrose 5% (CEFOTAN) IVPB 2 g     2 g Intravenous On call to O.R. 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 Sinusitis but need to rule out intraabdominal infection given complexity of operation.    LOS: 7 days   Plan: Check cbc.  CT of abdomen and pelvis.   Alison Best 10/03/2015

## 2015-10-03 NOTE — Discharge Instructions (Addendum)
[facility], [place] [phone]  OPEN ABDOMINAL SURGERY: POST OP INSTRUCTIONS  Always review your discharge instruction sheet given to you by the facility where your surgery was performed.  IF YOU HAVE DISABILITY OR FAMILY LEAVE FORMS, YOU MUST BRING THEM TO THE OFFICE FOR PROCESSING.  PLEASE DO NOT GIVE THEM TO YOUR DOCTOR.  1. A prescription for pain medication may be given to you upon discharge.  Take your pain medication as prescribed, if needed.  If narcotic pain medicine is not needed, then you may take acetaminophen (Tylenol) or ibuprofen (Advil) as needed. 2. Take your usually prescribed medications unless otherwise directed. 3. If you need a refill on your pain medication, please contact your pharmacy. They will contact our office to request authorization.  Prescriptions will not be filled after 5pm or on week-ends. 4. You should follow a bland, low fiber diet.  Be sure to include lots of fluids daily.  You may experience some constipation if taking pain medication after surgery.  Increasing fluid intake and taking a stool softener will usually help or prevent this problem from occurring.  A mild laxative (Milk of Magnesia or Miralax) should be taken according to package directions if there are no bowel movements after 48 hours. 5.  You may have steri-strips (small skin tapes) in place directly over the incision.  These strips should be left on the skin.  If your surgeon used skin glue on the incision, you may shower in 24 hours.  The glue will flake off over the next 2-3 weeks.  Any sutures or staples will be removed at the office during your follow-up visit. You may find that a light gauze bandage over your incision may keep your staples from being rubbed or pulled. You may shower and replace the bandage daily. 6. ACTIVITIES:  You may resume regular (light) daily activities beginning the next day--such as daily self-care, walking, climbing stairs--gradually increasing  activities as tolerated.  You may have sexual intercourse when it is comfortable.  Refrain from any heavy lifting or straining-nothing over 10 pounds for 6-8 weeks. a. You may drive when you no longer are taking prescription pain medication, you can comfortably wear a seatbelt, and you can safely maneuver your car and apply brakes b. Return to Work:  When released by MD___________________________________ 7. You should see your doctor in the office for a follow-up appointment approximately one week after your surgery.  Make sure that you call for this appointment within a day or two after you arrive home to insure a convenient appointment time. OTHER INSTRUCTIONS:  __Home Health nurses will help with dressing changes.___________________________________________________________ _____________________________________________________________  WHEN TO CALL YOUR DOCTOR: 1. Fever over 101.0 2. Inability to urinate 3. Nausea and/or vomiting 4. Extreme swelling or bruising 5. Continued bleeding from incision. 6. Increased pain, redness, or drainage from the incision.   The clinic staff is available to answer your questions during regular business hours.  Please dont hesitate to call and ask to speak to one of the nurses if you have concerns.  For further questions, please visit [email or web address]

## 2015-10-04 ENCOUNTER — Inpatient Hospital Stay (HOSPITAL_COMMUNITY): Payer: BLUE CROSS/BLUE SHIELD

## 2015-10-04 LAB — CBC
HCT: 29.2 % — ABNORMAL LOW (ref 36.0–46.0)
Hemoglobin: 9.9 g/dL — ABNORMAL LOW (ref 12.0–15.0)
MCH: 31.1 pg (ref 26.0–34.0)
MCHC: 33.9 g/dL (ref 30.0–36.0)
MCV: 91.8 fL (ref 78.0–100.0)
PLATELETS: 448 10*3/uL — AB (ref 150–400)
RBC: 3.18 MIL/uL — ABNORMAL LOW (ref 3.87–5.11)
RDW: 16.2 % — AB (ref 11.5–15.5)
WBC: 15.2 10*3/uL — AB (ref 4.0–10.5)

## 2015-10-04 MED ORDER — FENTANYL CITRATE (PF) 100 MCG/2ML IJ SOLN
INTRAMUSCULAR | Status: AC
  Filled 2015-10-04: qty 4

## 2015-10-04 MED ORDER — FENTANYL CITRATE (PF) 100 MCG/2ML IJ SOLN
INTRAMUSCULAR | Status: AC | PRN
  Administered 2015-10-04: 50 ug via INTRAVENOUS

## 2015-10-04 NOTE — Procedures (Signed)
Technically successful US guided aspiration of subcutaneous fluid collection superior to the ostomy take down site yielding approximately 15 cc of serous fluid. Aspirated fluid sent to lab for analysis.  No immediate post procedural complications.   Ronny Bacon, MD Pager #: [phone]

## 2015-10-04 NOTE — Progress Notes (Signed)
Dr. Marcello Moores aware via text and phone there were no specific dressing change orders in EPIC. See new orders entered by MD.

## 2015-10-04 NOTE — Progress Notes (Signed)
Dr.Thomas aware via phone of multiple attempts to remove guaze form LLQ incisional wound for ordered dressing change. MD aware most of packing will easily lift except for small portion at lt base of wound without significant tug. Area saline soaked 3 different times today without any release of gauze noted. No new orders received. MD recommended to continue to try to remove packing.

## 2015-10-04 NOTE — Progress Notes (Signed)
Attempted to remove packing at LLQ incisional wound a 4th time after NS soak. Still unable to remove completely. Clean NS dampened 4 x 4 added to wound bed. Wound bed granulating well with clean, pink appearance. Pt requested a break. Analgesic obtained per pt request.

## 2015-10-05 LAB — URINALYSIS, ROUTINE W REFLEX MICROSCOPIC
Bilirubin Urine: NEGATIVE
GLUCOSE, UA: NEGATIVE mg/dL
HGB URINE DIPSTICK: NEGATIVE
KETONES UR: NEGATIVE mg/dL
LEUKOCYTES UA: NEGATIVE
Nitrite: NEGATIVE
PROTEIN: NEGATIVE mg/dL
Specific Gravity, Urine: 1.005 (ref 1.005–1.030)
pH: 6.5 (ref 5.0–8.0)

## 2015-10-05 LAB — CBC
HCT: 26.5 % — ABNORMAL LOW (ref 36.0–46.0)
Hemoglobin: 9 g/dL — ABNORMAL LOW (ref 12.0–15.0)
MCH: 30.4 pg (ref 26.0–34.0)
MCHC: 34 g/dL (ref 30.0–36.0)
MCV: 89.5 fL (ref 78.0–100.0)
PLATELETS: 425 10*3/uL — AB (ref 150–400)
RBC: 2.96 MIL/uL — ABNORMAL LOW (ref 3.87–5.11)
RDW: 15.6 % — AB (ref 11.5–15.5)
WBC: 14.6 10*3/uL — AB (ref 4.0–10.5)

## 2015-10-05 NOTE — Progress Notes (Signed)
9 Days Post-Op  Subjective: Having some lower abd pain, Tolerating a diet, Having BM's  Objective: Vital signs in last 24 hours: Temp:  [98.4 F (36.9 C)-100.8 F (38.2 C)] 98.4 F (36.9 C) (12/11 0630) Pulse Rate:  [75-84] 81 (12/11 0630) Resp:  [18] 18 (12/11 0630) BP: (96-116)/(46-62) 116/56 mmHg (12/11 0630) SpO2:  [99 %-100 %] 100 % (12/11 0630) Last BM Date: 10/03/15  Intake/Output from previous day: 12/10 0701 - 12/11 0700 In: 3030 [P.O.:1390; I.V.:1440; IV Piggyback:200] Out: 1950 [Urine:1950] Intake/Output this shift: Total I/O In: -  Out: 200 [Urine:200]  PE: General- In NAD Abdomen-soft, no distension, open wound clean, packing removed with gentle distraction of deep tissues, packed with gauze  Lab Results:   Recent Labs  10/04/15 0937 10/05/15 0835  WBC 15.2* 14.6*  HGB 9.9* 9.0*  HCT 29.2* 26.5*  PLT 448* 425*   BMET  Recent Labs  10/03/15 0418  NA 138  K 4.2  CL 103  CO2 29  GLUCOSE 111*  BUN <5*  CREATININE 0.77  CALCIUM 8.1*   PT/INR No results for input(s): LABPROT, INR in the last 72 hours. Comprehensive Metabolic Panel:    Component Value Date/Time   NA 138 10/03/2015 0418   NA 141 10/01/2015 0938   K 4.2 10/03/2015 0418   K 3.2* 10/01/2015 0938   CL 103 10/03/2015 0418   CL 103 10/01/2015 0938   CO2 29 10/03/2015 0418   CO2 33* 10/01/2015 0938   BUN <5* 10/03/2015 0418   BUN <5* 10/01/2015 0938   CREATININE 0.77 10/03/2015 0418   CREATININE 0.69 10/01/2015 0938   GLUCOSE 111* 10/03/2015 0418   GLUCOSE 96 10/01/2015 0938   CALCIUM 8.1* 10/03/2015 0418   CALCIUM 8.2* 10/01/2015 0938   AST 29 09/26/2015 0650   AST 14* 03/03/2015 0600   ALT 28 09/26/2015 0650   ALT 12* 03/03/2015 0600   ALKPHOS 151* 09/26/2015 0650   ALKPHOS 76 03/03/2015 0600   BILITOT 0.7 09/26/2015 0650   BILITOT 0.8 03/03/2015 0600   PROT 7.7 09/26/2015 0650   PROT 5.3* 03/03/2015 0600   ALBUMIN 4.1 09/26/2015 0650   ALBUMIN 2.2* 03/03/2015 0600      Studies/Results: Ct Abdomen Pelvis W Contrast  10/03/2015  CLINICAL DATA:  Fever status post colostomy reversal one week ago. Resolving ileus. EXAM: CT ABDOMEN AND PELVIS WITH CONTRAST TECHNIQUE: Multidetector CT imaging of the abdomen and pelvis was performed using the standard protocol following bolus administration of intravenous contrast. CONTRAST:  168mL OMNIPAQUE IOHEXOL 300 MG/ML  SOLN COMPARISON:  Abdominal pelvic CT 03/05/2015. Barium enema 09/17/2015. FINDINGS: Lower chest: Interval clearing of the lung bases. No significant pleural or pericardial effusion. Hepatobiliary: The liver is normal in density without focal abnormality. No evidence of gallstones, gallbladder wall thickening or biliary dilatation. Pancreas: Unremarkable. No pancreatic ductal dilatation or surrounding inflammatory changes. Spleen: Normal in size without focal abnormality. Adrenals/Urinary Tract: Both adrenal glands appear normal. Both kidneys appear normal. There is no hydronephrosis or urinary tract calculus. Mild bladder wall thickening attributed to incomplete distention. Stomach/Bowel: Interval takedown of descending colostomy and rectal anastomosis. The anastomosis appears patent without surrounding fluid collection. There is mild residual distal colonic wall thickening. There are inflammatory changes throughout the pelvic fat. No residual or recurrent intrapelvic fluid collections demonstrated. There is no significant small bowel distension or wall thickening. Vascular/Lymphatic: There are no enlarged abdominal or pelvic lymph nodes. Small lymph nodes within the porta hepatis are stable. There are also stable  small lymph nodes in the pelvis, likely reactive. No significant vascular findings. Reproductive: Unremarkable. Other: Right pelvic surgical drain is in place. There are postsurgical changes within the anterior abdominal wall related to the descending colostomy takedown. There is a subcutaneous fluid collection  on the left measuring 6.0 x 2.3 cm on image 60. There are inflammatory changes within the adjacent subcutaneous fat with mild asymmetric enlargement of the left rectus abdominus muscle. No other focal fluid collections are demonstrated. Musculoskeletal: No acute or significant osseous findings. IMPRESSION: 1. Nonspecific fluid collection within the subcutaneous fat of the left lower anterior abdominal wall status post interval colostomy takedown. This may reflect a seroma or abscess. 2. No residual intrapelvic fluid collections identified status post colostomy takedown and anastomosis. There are mild inflammatory changes in the pelvic fat. The anastomosis appears patent. 3. No evidence of bowel obstruction or significant ileus. 4. The solid parenchymal organs demonstrate no significant findings. Electronically Signed   By: Richardean Sale M.D.   On: 10/03/2015 15:41   US Aspiration  10/04/2015  INDICATION: History of colostomy reversal, now with indeterminate fluid collection within the subcutaneous tissues cranial to the ostomy take-down. EXAM: ULTRASOUND-GUIDED ASPIRATION OF INDETERMINATE FLUID COLLECTION CRANIAL TO THE OSTOMY TAKEDOWN SITE. COMPARISON:  CT abdomen pelvis - 10/03/2015 MEDICATIONS: The patient is currently admitted to the hospital and receiving intravenous antibiotics. The antibiotics were administered within an appropriate time frame prior to the initiation of the procedure. ANESTHESIA/SEDATION: Fentanyl 50 mcg IV Total Moderate Sedation time 5 minutes CONTRAST:  None COMPLICATIONS: None immediate PROCEDURE: Informed written consent was obtained from the patient after a discussion of the risks, benefits and alternatives to treatment. The patient was placed supine with preprocedural imaging of the area of concern demonstrating a fairly well-defined approximately 5.9 x 1.9 x 3.3 cm anechoic fluid collection within the subcutaneous tissues cranial to the patient's ostomy takedown site (images 2  and 3). The procedure was planned. A timeout was performed prior to the initiation of the procedure. The overlying skin was prepped and draped in the usual sterile fashion. The overlying soft tissues were anesthetized with 1% lidocaine with epinephrine. An 18 gauge trocar needle was advanced into the fluid collection yielding a total of approximately 15 cc of serous appearing fluid. All aspirated fluid was capped and sent to the laboratory for analysis. A dressing was placed. The patient tolerated the procedure well without immediate post procedural complication. IMPRESSION: Successful ultrasound guided aspiration of approximately 15 cc of serous appearing fluid from the subcutaneous tissues cranial to the patient's ostomy takedown site. As the aspirated fluid was favored to represent a small seroma, a percutaneous drainage catheter was not placed at this time. Samples were sent to the laboratory as requested by the ordering clinical team. Electronically Signed   By: Sandi Mariscal M.D.   On: 10/04/2015 13:39    Anti-infectives: Anti-infectives    Start     Dose/Rate Route Frequency Ordered Stop   10/03/15 1800  piperacillin-tazobactam (ZOSYN) IVPB 3.375 g     3.375 g 12.5 mL/hr over 240 Minutes Intravenous 3 times per day 10/03/15 1708     09/26/15 2000  cefoTEtan (CEFOTAN) 2 g in dextrose 5 % 50 mL IVPB     2 g 100 mL/hr over 30 Minutes Intravenous Every 12 hours 09/26/15 1405 09/26/15 2031   09/26/15 0635  cefoTEtan in Dextrose 5% (CEFOTAN) IVPB 2 g     2 g Intravenous On call to O.R. 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 Sinusitis but need to rule out intraabdominal infection given complexity of operation.    LOS: 9 days   Plan: Persistent elevated wbd with  no obvious infection, US aspiration of abd wall fluid collection showed only seroma.  Will check UA.  repeat CBC in AM   Tavis Kring C. [id number]

## 2015-10-05 NOTE — Progress Notes (Signed)
9 Days Post-Op  Subjective: Feels ok today.  Tolerating some of her soft food.    Objective: Vital signs in last 24 hours: Temp:  [98.4 F (36.9 C)-100.8 F (38.2 C)] 98.4 F (36.9 C) (12/11 0630) Pulse Rate:  [75-84] 81 (12/11 0630) Resp:  [18] 18 (12/11 0630) BP: (96-116)/(46-62) 116/56 mmHg (12/11 0630) SpO2:  [99 %-100 %] 100 % (12/11 0630) Last BM Date: 10/03/15  Intake/Output from previous day: 12/10 0701 - 12/11 0700 In: 3030 [P.O.:1390; I.V.:1440; IV Piggyback:200] Out: 1950 [Urine:1950] Intake/Output this shift: Total I/O In: -  Out: 200 [Urine:200]  PE: General- In NAD Abdomen-soft, no distension, open wound clean, other wounds clean, no signs of wound infection  Lab Results:   Recent Labs  10/04/15 0937 10/05/15 0835  WBC 15.2* 14.6*  HGB 9.9* 9.0*  HCT 29.2* 26.5*  PLT 448* 425*   BMET  Recent Labs  10/03/15 0418  NA 138  K 4.2  CL 103  CO2 29  GLUCOSE 111*  BUN <5*  CREATININE 0.77  CALCIUM 8.1*   PT/INR No results for input(s): LABPROT, INR in the last 72 hours. Comprehensive Metabolic Panel:    Component Value Date/Time   NA 138 10/03/2015 0418   NA 141 10/01/2015 0938   K 4.2 10/03/2015 0418   K 3.2* 10/01/2015 0938   CL 103 10/03/2015 0418   CL 103 10/01/2015 0938   CO2 29 10/03/2015 0418   CO2 33* 10/01/2015 0938   BUN <5* 10/03/2015 0418   BUN <5* 10/01/2015 0938   CREATININE 0.77 10/03/2015 0418   CREATININE 0.69 10/01/2015 0938   GLUCOSE 111* 10/03/2015 0418   GLUCOSE 96 10/01/2015 0938   CALCIUM 8.1* 10/03/2015 0418   CALCIUM 8.2* 10/01/2015 0938   AST 29 09/26/2015 0650   AST 14* 03/03/2015 0600   ALT 28 09/26/2015 0650   ALT 12* 03/03/2015 0600   ALKPHOS 151* 09/26/2015 0650   ALKPHOS 76 03/03/2015 0600   BILITOT 0.7 09/26/2015 0650   BILITOT 0.8 03/03/2015 0600   PROT 7.7 09/26/2015 0650   PROT 5.3* 03/03/2015 0600   ALBUMIN 4.1 09/26/2015 0650   ALBUMIN 2.2* 03/03/2015 0600     Studies/Results: Ct  Abdomen Pelvis W Contrast  10/03/2015  CLINICAL DATA:  Fever status post colostomy reversal one week ago. Resolving ileus. EXAM: CT ABDOMEN AND PELVIS WITH CONTRAST TECHNIQUE: Multidetector CT imaging of the abdomen and pelvis was performed using the standard protocol following bolus administration of intravenous contrast. CONTRAST:  185mL OMNIPAQUE IOHEXOL 300 MG/ML  SOLN COMPARISON:  Abdominal pelvic CT 03/05/2015. Barium enema 09/17/2015. FINDINGS: Lower chest: Interval clearing of the lung bases. No significant pleural or pericardial effusion. Hepatobiliary: The liver is normal in density without focal abnormality. No evidence of gallstones, gallbladder wall thickening or biliary dilatation. Pancreas: Unremarkable. No pancreatic ductal dilatation or surrounding inflammatory changes. Spleen: Normal in size without focal abnormality. Adrenals/Urinary Tract: Both adrenal glands appear normal. Both kidneys appear normal. There is no hydronephrosis or urinary tract calculus. Mild bladder wall thickening attributed to incomplete distention. Stomach/Bowel: Interval takedown of descending colostomy and rectal anastomosis. The anastomosis appears patent without surrounding fluid collection. There is mild residual distal colonic wall thickening. There are inflammatory changes throughout the pelvic fat. No residual or recurrent intrapelvic fluid collections demonstrated. There is no significant small bowel distension or wall thickening. Vascular/Lymphatic: There are no enlarged abdominal or pelvic lymph nodes. Small lymph nodes within the porta hepatis are stable. There are also stable small  lymph nodes in the pelvis, likely reactive. No significant vascular findings. Reproductive: Unremarkable. Other: Right pelvic surgical drain is in place. There are postsurgical changes within the anterior abdominal wall related to the descending colostomy takedown. There is a subcutaneous fluid collection on the left measuring 6.0 x  2.3 cm on image 60. There are inflammatory changes within the adjacent subcutaneous fat with mild asymmetric enlargement of the left rectus abdominus muscle. No other focal fluid collections are demonstrated. Musculoskeletal: No acute or significant osseous findings. IMPRESSION: 1. Nonspecific fluid collection within the subcutaneous fat of the left lower anterior abdominal wall status post interval colostomy takedown. This may reflect a seroma or abscess. 2. No residual intrapelvic fluid collections identified status post colostomy takedown and anastomosis. There are mild inflammatory changes in the pelvic fat. The anastomosis appears patent. 3. No evidence of bowel obstruction or significant ileus. 4. The solid parenchymal organs demonstrate no significant findings. Electronically Signed   By: Richardean Sale M.D.   On: 10/03/2015 15:41   US Aspiration  10/04/2015  INDICATION: History of colostomy reversal, now with indeterminate fluid collection within the subcutaneous tissues cranial to the ostomy take-down. EXAM: ULTRASOUND-GUIDED ASPIRATION OF INDETERMINATE FLUID COLLECTION CRANIAL TO THE OSTOMY TAKEDOWN SITE. COMPARISON:  CT abdomen pelvis - 10/03/2015 MEDICATIONS: The patient is currently admitted to the hospital and receiving intravenous antibiotics. The antibiotics were administered within an appropriate time frame prior to the initiation of the procedure. ANESTHESIA/SEDATION: Fentanyl 50 mcg IV Total Moderate Sedation time 5 minutes CONTRAST:  None COMPLICATIONS: None immediate PROCEDURE: Informed written consent was obtained from the patient after a discussion of the risks, benefits and alternatives to treatment. The patient was placed supine with preprocedural imaging of the area of concern demonstrating a fairly well-defined approximately 5.9 x 1.9 x 3.3 cm anechoic fluid collection within the subcutaneous tissues cranial to the patient's ostomy takedown site (images 2 and 3). The procedure was  planned. A timeout was performed prior to the initiation of the procedure. The overlying skin was prepped and draped in the usual sterile fashion. The overlying soft tissues were anesthetized with 1% lidocaine with epinephrine. An 18 gauge trocar needle was advanced into the fluid collection yielding a total of approximately 15 cc of serous appearing fluid. All aspirated fluid was capped and sent to the laboratory for analysis. A dressing was placed. The patient tolerated the procedure well without immediate post procedural complication. IMPRESSION: Successful ultrasound guided aspiration of approximately 15 cc of serous appearing fluid from the subcutaneous tissues cranial to the patient's ostomy takedown site. As the aspirated fluid was favored to represent a small seroma, a percutaneous drainage catheter was not placed at this time. Samples were sent to the laboratory as requested by the ordering clinical team. Electronically Signed   By: Sandi Mariscal M.D.   On: 10/04/2015 13:39    Anti-infectives: Anti-infectives    Start     Dose/Rate Route Frequency Ordered Stop   10/03/15 1800  piperacillin-tazobactam (ZOSYN) IVPB 3.375 g     3.375 g 12.5 mL/hr over 240 Minutes Intravenous 3 times per day 10/03/15 1708     09/26/15 2000  cefoTEtan (CEFOTAN) 2 g in dextrose 5 % 50 mL IVPB     2 g 100 mL/hr over 30 Minutes Intravenous Every 12 hours 09/26/15 1405 09/26/15 2031   09/26/15 0635  cefoTEtan in Dextrose 5% (CEFOTAN) IVPB 2 g     2 g Intravenous On call to O.R. 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 Sinusitis but need to rule out intraabdominal infection given complexity of operation.    LOS: 9 days   Plan: Check CBC, US aspiration today of abd wall fluid  collection   Julian Askin C. [id number]

## 2015-10-06 LAB — CBC
HEMATOCRIT: 26.3 % — AB (ref 36.0–46.0)
HEMOGLOBIN: 8.8 g/dL — AB (ref 12.0–15.0)
MCH: 29.9 pg (ref 26.0–34.0)
MCHC: 33.5 g/dL (ref 30.0–36.0)
MCV: 89.5 fL (ref 78.0–100.0)
PLATELETS: 496 10*3/uL — AB (ref 150–400)
RBC: 2.94 MIL/uL — AB (ref 3.87–5.11)
RDW: 15.4 % (ref 11.5–15.5)
WBC: 12.3 10*3/uL — ABNORMAL HIGH (ref 4.0–10.5)

## 2015-10-06 NOTE — Progress Notes (Signed)
10 Days Post-Op  Subjective: Getting stronger.  Sore around open left abdominal wound Objective: Vital signs in last 24 hours: Temp:  [98.8 F (37.1 C)-99.5 F (37.5 C)] 98.8 F (37.1 C) (12/12 0537) Pulse Rate:  [77-86] 77 (12/12 0537) Resp:  [16-18] 16 (12/12 0537) BP: (94-113)/(55-57) 94/55 mmHg (12/12 0537) SpO2:  [94 %-100 %] 94 % (12/12 0537) Last BM Date: 10/06/15  Intake/Output from previous day: 12/11 0701 - 12/12 0700 In: 700 [I.V.:600; IV Piggyback:100] Out: 1700 [Urine:1700] Intake/Output this shift:    PE: General- In NAD Abdomen-soft, no distension, open wound has some redness and induration superior to it with tenderness, other wounds clean  Lab Results:   Recent Labs  10/05/15 0835 10/06/15 0444  WBC 14.6* 12.3*  HGB 9.0* 8.8*  HCT 26.5* 26.3*  PLT 425* 496*   BMET No results for input(s): NA, K, CL, CO2, GLUCOSE, BUN, CREATININE, CALCIUM in the last 72 hours. PT/INR No results for input(s): LABPROT, INR in the last 72 hours. Comprehensive Metabolic Panel:    Component Value Date/Time   NA 138 10/03/2015 0418   NA 141 10/01/2015 0938   K 4.2 10/03/2015 0418   K 3.2* 10/01/2015 0938   CL 103 10/03/2015 0418   CL 103 10/01/2015 0938   CO2 29 10/03/2015 0418   CO2 33* 10/01/2015 0938   BUN <5* 10/03/2015 0418   BUN <5* 10/01/2015 0938   CREATININE 0.77 10/03/2015 0418   CREATININE 0.69 10/01/2015 0938   GLUCOSE 111* 10/03/2015 0418   GLUCOSE 96 10/01/2015 0938   CALCIUM 8.1* 10/03/2015 0418   CALCIUM 8.2* 10/01/2015 0938   AST 29 09/26/2015 0650   AST 14* 03/03/2015 0600   ALT 28 09/26/2015 0650   ALT 12* 03/03/2015 0600   ALKPHOS 151* 09/26/2015 0650   ALKPHOS 76 03/03/2015 0600   BILITOT 0.7 09/26/2015 0650   BILITOT 0.8 03/03/2015 0600   PROT 7.7 09/26/2015 0650   PROT 5.3* 03/03/2015 0600   ALBUMIN 4.1 09/26/2015 0650   ALBUMIN 2.2* 03/03/2015 0600     Studies/Results: US Aspiration  10/04/2015  INDICATION: History of  colostomy reversal, now with indeterminate fluid collection within the subcutaneous tissues cranial to the ostomy take-down. EXAM: ULTRASOUND-GUIDED ASPIRATION OF INDETERMINATE FLUID COLLECTION CRANIAL TO THE OSTOMY TAKEDOWN SITE. COMPARISON:  CT abdomen pelvis - 10/03/2015 MEDICATIONS: The patient is currently admitted to the hospital and receiving intravenous antibiotics. The antibiotics were administered within an appropriate time frame prior to the initiation of the procedure. ANESTHESIA/SEDATION: Fentanyl 50 mcg IV Total Moderate Sedation time 5 minutes CONTRAST:  None COMPLICATIONS: None immediate PROCEDURE: Informed written consent was obtained from the patient after a discussion of the risks, benefits and alternatives to treatment. The patient was placed supine with preprocedural imaging of the area of concern demonstrating a fairly well-defined approximately 5.9 x 1.9 x 3.3 cm anechoic fluid collection within the subcutaneous tissues cranial to the patient's ostomy takedown site (images 2 and 3). The procedure was planned. A timeout was performed prior to the initiation of the procedure. The overlying skin was prepped and draped in the usual sterile fashion. The overlying soft tissues were anesthetized with 1% lidocaine with epinephrine. An 18 gauge trocar needle was advanced into the fluid collection yielding a total of approximately 15 cc of serous appearing fluid. All aspirated fluid was capped and sent to the laboratory for analysis. A dressing was placed. The patient tolerated the procedure well without immediate post procedural complication. IMPRESSION: Successful ultrasound guided  aspiration of approximately 15 cc of serous appearing fluid from the subcutaneous tissues cranial to the patient's ostomy takedown site. As the aspirated fluid was favored to represent a small seroma, a percutaneous drainage catheter was not placed at this time. Samples were sent to the laboratory as requested by the  ordering clinical team. Electronically Signed   By: Sandi Mariscal M.D.   On: 10/04/2015 13:39    Anti-infectives: Anti-infectives    Start     Dose/Rate Route Frequency Ordered Stop   10/03/15 1800  piperacillin-tazobactam (ZOSYN) IVPB 3.375 g     3.375 g 12.5 mL/hr over 240 Minutes Intravenous 3 times per day 10/03/15 1708     09/26/15 2000  cefoTEtan (CEFOTAN) 2 g in dextrose 5 % 50 mL IVPB     2 g 100 mL/hr over 30 Minutes Intravenous Every 12 hours 09/26/15 1405 09/26/15 2031   09/26/15 0635  cefoTEtan in Dextrose 5% (CEFOTAN) IVPB 2 g     2 g Intravenous On call to O.R. 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 Remove staples.   Mahogani Holohan J 10/06/2015

## 2015-10-07 LAB — CBC
HEMATOCRIT: 26.8 % — AB (ref 36.0–46.0)
Hemoglobin: 8.9 g/dL — ABNORMAL LOW (ref 12.0–15.0)
MCH: 30.1 pg (ref 26.0–34.0)
MCHC: 33.2 g/dL (ref 30.0–36.0)
MCV: 90.5 fL (ref 78.0–100.0)
PLATELETS: 599 10*3/uL — AB (ref 150–400)
RBC: 2.96 MIL/uL — ABNORMAL LOW (ref 3.87–5.11)
RDW: 15.6 % — AB (ref 11.5–15.5)
WBC: 10.5 10*3/uL (ref 4.0–10.5)

## 2015-10-07 MED ORDER — AMOXICILLIN-POT CLAVULANATE 875-125 MG PO TABS: 1.0000 | ORAL_TABLET | Freq: Two times a day (BID) | ORAL | Status: DC

## 2015-10-07 NOTE — Progress Notes (Addendum)
Changed abdominal dressing per M.D. Order. Marked 10/07/15 at 0300am.

## 2015-10-07 NOTE — Progress Notes (Signed)
Nutrition Brief Note  Patient identified via low braden report. Pt has open abdomen from colostomy closure.  Wt Readings from Last 15 Encounters:  09/26/15 188 lb 9 oz (85.531 kg)  09/24/15 188 lb 9.6 oz (85.548 kg)  07/30/15 172 lb 6.4 oz (78.2 kg)  07/04/15 172 lb 6.4 oz (78.2 kg)  06/06/15 159 lb (72.122 kg)  05/28/15 149 lb (67.586 kg)  05/22/15 149 lb 12.8 oz (67.949 kg)  05/21/15 150 lb (68.04 kg)  03/19/15 172 lb (78.019 kg)  03/11/15 174 lb 12.8 oz (79.289 kg)  03/07/15 167 lb (75.751 kg)  02/25/15 187 lb 2.7 oz (84.9 kg)  09/16/14 217 lb (98.431 kg)  06/18/14 223 lb (101.152 kg)  06/06/14 221 lb (100.245 kg)    Body mass index is 36.21 kg/(m^2). Patient meets criteria for obese class II based on current BMI.   Current diet order is soft, patient is consuming approximately 15-100% of meals at this time. Labs and medications reviewed.   No nutrition interventions warranted at this time. If nutrition issues arise, please consult RD.   Satira Anis. Melissa Pulido, MS, RD LDN After Hours/Weekend Pager [phone]

## 2015-10-07 NOTE — Progress Notes (Signed)
11 Days Post-Op  Subjective: Continues to feel better overall Objective: Vital signs in last 24 hours: Temp:  [98.2 F (36.8 C)-98.8 F (37.1 C)] 98.8 F (37.1 C) (12/13 0600) Pulse Rate:  [67-81] 75 (12/13 0600) Resp:  [16-18] 17 (12/13 0600) BP: (87-118)/(44-52) 92/52 mmHg (12/13 0600) SpO2:  [97 %-100 %] 97 % (12/13 0600) Last BM Date: 10/06/15  Intake/Output from previous day: 12/12 0701 - 12/13 0700 In: 120 [I.V.:120] Out: 2451 [Urine:2450; Stool:1] Intake/Output this shift:    PE: General- In NAD Abdomen-soft, no distension, open wound has less redness and induration superior to it with tenderness, other wounds clean  Lab Results:   Recent Labs  10/06/15 0444 10/07/15 0546  WBC 12.3* 10.5  HGB 8.8* 8.9*  HCT 26.3* 26.8*  PLT 496* 599*   BMET No results for input(s): NA, K, CL, CO2, GLUCOSE, BUN, CREATININE, CALCIUM in the last 72 hours. PT/INR No results for input(s): LABPROT, INR in the last 72 hours. Comprehensive Metabolic Panel:    Component Value Date/Time   NA 138 10/03/2015 0418   NA 141 10/01/2015 0938   K 4.2 10/03/2015 0418   K 3.2* 10/01/2015 0938   CL 103 10/03/2015 0418   CL 103 10/01/2015 0938   CO2 29 10/03/2015 0418   CO2 33* 10/01/2015 0938   BUN <5* 10/03/2015 0418   BUN <5* 10/01/2015 0938   CREATININE 0.77 10/03/2015 0418   CREATININE 0.69 10/01/2015 0938   GLUCOSE 111* 10/03/2015 0418   GLUCOSE 96 10/01/2015 0938   CALCIUM 8.1* 10/03/2015 0418   CALCIUM 8.2* 10/01/2015 0938   AST 29 09/26/2015 0650   AST 14* 03/03/2015 0600   ALT 28 09/26/2015 0650   ALT 12* 03/03/2015 0600   ALKPHOS 151* 09/26/2015 0650   ALKPHOS 76 03/03/2015 0600   BILITOT 0.7 09/26/2015 0650   BILITOT 0.8 03/03/2015 0600   PROT 7.7 09/26/2015 0650   PROT 5.3* 03/03/2015 0600   ALBUMIN 4.1 09/26/2015 0650   ALBUMIN 2.2* 03/03/2015 0600     Studies/Results: No results found.  Anti-infectives: Anti-infectives    Start     Dose/Rate Route  Frequency Ordered Stop   10/03/15 1800  piperacillin-tazobactam (ZOSYN) IVPB 3.375 g     3.375 g 12.5 mL/hr over 240 Minutes Intravenous 3 times per day 10/03/15 1708     09/26/15 2000  cefoTEtan (CEFOTAN) 2 g in dextrose 5 % 50 mL IVPB     2 g 100 mL/hr over 30 Minutes Intravenous Every 12 hours 09/26/15 1405 09/26/15 2031   09/26/15 0635  cefoTEtan in Dextrose 5% (CEFOTAN) IVPB 2 g     2 g Intravenous On call to O.R. 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  HHRN to assist with wound care-daily NS wet to dry dressing change to open abdominal wound.  Follow up next week in office.   Tasean Mancha J 10/07/2015

## 2015-10-07 NOTE — Care Management Note (Signed)
Case Management Note  Patient Details  Name: Alison Best MRN: [id number] Date of Birth: February 15, 1956  Subjective/Objective:          Admitted s/p colostomy reversal          Action/Plan: Discharge planning, spoke with patient at bedside. Will need [facility] RN for wound care, would like to use [facility] as she has used them in the past. Budd Palmer for referral, faxed information to [phone].  Expected Discharge Date:  10/03/15               Expected Discharge Plan:  [place]  In-House Referral:  NA  Discharge planning Services  CM Consult  Post Acute Care Choice:  Home Health Choice offered to:  Patient  DME Arranged:  N/A DME Agency:  NA  HH Arranged:  RN [place] Agency:  [place]  Status of Service:  Completed, signed off  Medicare Important Message Given:    Date Medicare IM Given:    Medicare IM give by:    Date Additional Medicare IM Given:    Additional Medicare Important Message give by:     If discussed at [place] of Stay Meetings, dates discussed:    Additional Comments:  Guadalupe Maple, RN 10/07/2015, 9:34 AM

## 2015-10-08 LAB — CULTURE, ROUTINE-ABSCESS: Culture: NO GROWTH

## 2015-10-30 ENCOUNTER — Ambulatory Visit (INDEPENDENT_AMBULATORY_CARE_PROVIDER_SITE_OTHER): Payer: BLUE CROSS/BLUE SHIELD | Admitting: Family Medicine

## 2015-10-30 ENCOUNTER — Encounter: Payer: Self-pay | Admitting: Family Medicine

## 2015-10-30 VITALS — BP 106/68 | HR 71 | Temp 98.3°F | Ht 60.5 in | Wt 179.0 lb

## 2015-10-30 DIAGNOSIS — J019 Acute sinusitis, unspecified: Secondary | ICD-10-CM

## 2015-10-30 MED ORDER — AZITHROMYCIN 250 MG PO TABS: ORAL_TABLET | ORAL | Status: DC

## 2015-10-30 NOTE — Discharge Summary (Signed)
Physician Discharge Summary  Patient ID: LANEYA KRIESEL MRN: [id number] DOB/AGE: [birth year] 59 y.o.  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 Postoperatively, she was put on the colorectal surgery pathway and we were advancing her diet until she had lingering ileus and had to have the diet decreased back from solid food to full liquids.she was able to tolerate solid food on her sixth postoperative day.She had her colostomy site wound open and being packed.Under 7 postoperative day she had some fever and leukocytosis. Urinalysis did not demonstrate evidence for infection. CT scan demonstrated no evidence of intra-abdominal infection. Lateral to the open colostomy wound with was a fluid collection. On exam the skin around the colostomy open wound was noted to be red and indurated. She started on intravenous Zosyn. Percutaneous aspiration was performed of the fluid. Cultures were negative however after this her fever resolved, he erythema and induration improved, and her white blood cell count normalized. By her 11th postoperative day  She was doing much better, sshe switched to oral antibiotics, her open wound was healing and without further evidence of infection, or staples been removed and drain removed.  She was able to be discharged On 10/07/2015. She had a follow-up visit in the office. Specific discharge instructions were given to her.Home health was arranged to assist her with dressing changes.  Discharge Exam: Blood pressure 92/52, pulse 75, temperature 98.8 F (37.1 C), temperature source Oral, resp. rate 17, height 5' 0.5"  (1.537 m), weight 85.531 kg (188 lb 9 oz), last menstrual period 02/23/2012, SpO2 97 %.   Disposition: 06-Home-Health Care Svc     Medication List    TAKE these medications        acetaminophen 325 MG tablet  Commonly known as:  TYLENOL  Take 2 tablets (650 mg total) by mouth every 6 (six) hours as needed for moderate pain or headache.     albuterol 108 (90 Base) MCG/ACT inhaler  Commonly known as:  PROAIR HFA  Inhale 2 puffs into the lungs every 4 (four) hours as needed for wheezing.     amoxicillin-clavulanate 875-125 MG tablet  Commonly known as:  AUGMENTIN  Take 1 tablet by mouth 2 (two) times daily.     aspirin 81 MG tablet  Take 81 mg by mouth daily.     clotrimazole-betamethasone cream  Commonly known as:  LOTRISONE  Apply 1 application topically daily as needed (irritation around stoma site). Reported on 10/30/2015     furosemide 20 MG tablet  Commonly known as:  LASIX  TAKE 1 TABLET BY MOUTH DAILY AS NEEDED (FLUID)     methimazole 5 MG tablet  Commonly known as:  TAPAZOLE  Take 1 tablet (5 mg total) by mouth 2 (two) times daily. **PT NEEDS LABS FOR FURTHER REFILLS**     metoprolol succinate 50 MG 24 hr tablet  Commonly known as:  TOPROL-XL  Take 1 tablet (50 mg total) by mouth daily. Take with or immediately following a meal.     multivitamin with minerals tablet  Take 1 tablet by mouth daily.     oxyCODONE 5 MG immediate release tablet  Commonly known as:  Oxy IR/ROXICODONE  Take 1-2 tablets (5-10 mg total) by mouth every 4 (four) hours as needed for moderate pain.           Follow-up Information    Follow up with [facility].   Specialty:  [place]   Why:  Wound care   Contact information:   [address] [place] [phone]       Signed: Odis Hollingshead 10/30/2015, 12:47 PM

## 2015-10-30 NOTE — Progress Notes (Signed)
Pre visit review using our clinic review tool, if applicable. No additional management support is needed unless otherwise documented below in the visit note. 

## 2015-10-31 NOTE — Progress Notes (Signed)
   Subjective:    Patient ID: Alison Best, female    DOB: Jul 17, 1956, 60 y.o.   MRN: [id number]  HPI Here for one week of sinus pressure, PND, ST, and a dry cough. No fever.    Review of Systems  Constitutional: Negative.   HENT: Positive for congestion, postnasal drip, sinus pressure and sore throat. Negative for ear pain.   Eyes: Negative.   Respiratory: Positive for cough.        Objective:   Physical Exam  Constitutional: She appears well-developed and well-nourished.  HENT:  Right Ear: External ear normal.  Left Ear: External ear normal.  Nose: Nose normal.  Mouth/Throat: Oropharynx is clear and moist.  Eyes: Conjunctivae are normal.  Pulmonary/Chest: Effort normal and breath sounds normal.  Lymphadenopathy:    She has no cervical adenopathy.          Assessment & Plan:  Sinusitis, treat with a Zpack

## 2015-11-12 ENCOUNTER — Other Ambulatory Visit: Payer: Self-pay | Admitting: Internal Medicine

## 2015-12-07 ENCOUNTER — Encounter: Payer: Self-pay | Admitting: Internal Medicine

## 2015-12-09 ENCOUNTER — Other Ambulatory Visit: Payer: Self-pay | Admitting: Internal Medicine

## 2015-12-09 DIAGNOSIS — E0590 Thyrotoxicosis, unspecified without thyrotoxic crisis or storm: Secondary | ICD-10-CM

## 2015-12-25 ENCOUNTER — Ambulatory Visit: Payer: BLUE CROSS/BLUE SHIELD | Admitting: Dietician

## 2015-12-26 ENCOUNTER — Encounter: Payer: Self-pay | Admitting: Internal Medicine

## 2015-12-29 ENCOUNTER — Other Ambulatory Visit (INDEPENDENT_AMBULATORY_CARE_PROVIDER_SITE_OTHER): Payer: BLUE CROSS/BLUE SHIELD

## 2015-12-29 DIAGNOSIS — E0590 Thyrotoxicosis, unspecified without thyrotoxic crisis or storm: Secondary | ICD-10-CM | POA: Diagnosis not present

## 2015-12-29 LAB — TSH: TSH: 0.6 u[IU]/mL (ref 0.35–4.50)

## 2015-12-29 LAB — T4, FREE: Free T4: 0.96 ng/dL (ref 0.60–1.60)

## 2015-12-29 LAB — T3, FREE: T3, Free: 3 pg/mL (ref 2.3–4.2)

## 2015-12-30 ENCOUNTER — Ambulatory Visit: Payer: BLUE CROSS/BLUE SHIELD

## 2015-12-30 ENCOUNTER — Encounter: Payer: Self-pay | Admitting: Internal Medicine

## 2015-12-31 ENCOUNTER — Other Ambulatory Visit (INDEPENDENT_AMBULATORY_CARE_PROVIDER_SITE_OTHER): Payer: BLUE CROSS/BLUE SHIELD | Admitting: *Deleted

## 2015-12-31 ENCOUNTER — Ambulatory Visit (INDEPENDENT_AMBULATORY_CARE_PROVIDER_SITE_OTHER): Payer: BLUE CROSS/BLUE SHIELD | Admitting: *Deleted

## 2015-12-31 DIAGNOSIS — Z23 Encounter for immunization: Secondary | ICD-10-CM

## 2016-01-11 ENCOUNTER — Other Ambulatory Visit: Payer: Self-pay | Admitting: Cardiovascular Disease

## 2016-02-05 ENCOUNTER — Encounter: Payer: Self-pay | Admitting: Dietician

## 2016-02-05 ENCOUNTER — Encounter: Payer: BLUE CROSS/BLUE SHIELD | Attending: Internal Medicine | Admitting: Dietician

## 2016-02-05 ENCOUNTER — Encounter: Payer: Self-pay | Admitting: Internal Medicine

## 2016-02-05 VITALS — Ht 60.5 in | Wt 208.0 lb

## 2016-02-05 DIAGNOSIS — R635 Abnormal weight gain: Secondary | ICD-10-CM

## 2016-02-05 DIAGNOSIS — Z029 Encounter for administrative examinations, unspecified: Secondary | ICD-10-CM | POA: Insufficient documentation

## 2016-02-05 NOTE — Patient Instructions (Signed)
Be as active as possible.  Consistent and increase slowly. Be mindful in your eating.  Continue to eat slowly, choose wisely, stop when you are full. Have a larger breakfast with carbohydrates and protein.  Aim for 2-3 Carb Choices per meal (30-45 grams) +/- 1 either way  Aim for 0-1 Carbs per snack if hungry  Include protein in moderation with your meals and snacks Consider reading food labels for Total Carbohydrate and Fat Grams of foods

## 2016-02-05 NOTE — Progress Notes (Signed)
Medical Nutrition Therapy:  Appt start time: 1600 end time:  1700.   Assessment:  Primary concerns today: Patient is here alone today because of increased weight gain.  She would like to reduce her weight. She reports after the takedown surgery that her carbohydrate cravings have increased.  These decreased in the past when she was on Metformin and was also in [place] for a month eating a Mediterranean diet.   Hx includes:  Thyrotoxicosis, prediabetes and 11 months ago she had a severe bout of diverticulitis requiring part of her colon to be removed with colostomy takedown 09/2015. Her last HgbA1C was 6.2% 03/25/15.  She has also had OSA in the past.  She has gone to a couple Core Diabetes classes in the past at [place].  Patient lives with her husband and 88 year son.  She is also helping care for both sets of elderly parents.  Patient does most of the shopping and cooking but husband and son will help at times.  She works for [employer] and does [employer].  Weight hx: Lowest recent adult weight 148 lbs in June 2017 Highest 220 lbs fall 2015.  Preferred Learning Style:   No preference indicated   Learning Readiness:   Ready  Change in progress   MEDICATIONS: see list to include a supplement which contains tumeric, resveritrol and CoQ10.   DIETARY INTAKE:  Usual eating pattern includes 2-3 meals and 2-3 snacks per day. Avoided foods include artificial sweeteners.    24-hr recall:  B ( AM): plain black tea, [employer] toast with peanut butter OR string cheese Snk ( AM): none  L ( PM): "varied" a lot of lunch meetings (catered or restaurants or on her own - orders take out, toast or soup) Snk ( PM): occasional- pretzels or other carb or fruit D ( PM): quiche with toast with or without "pirate's booty" snack OR salads with chicken Snk ( PM): occasional ice cream or cookies if it is the house.  More recently. Beverages: unsweetened black tea, water, OJ (6-12  ounces per day)  Usual physical activity:  enjoys walking- has been parking father away, using stairs at work.  Recent surgery in December.    Estimated energy needs: 1500 calories 170 g carbohydrates 112 g protein 42 g fat  Progress Towards Goal(s):  In progress.   Nutritional Diagnosis:  NB-1.1 Food and nutrition-related knowledge deficit As related to balance of carbohydrates, protein, and fat.  As evidenced by diet hx.    Intervention:  Nutrition counseling/education related to balancing intake for weight loss and blood sugar control.  Discussed foods by portion size.  Discussed improving breakfast, and changing food choices to help improve carb cravings.  Be as active as possible.  Consistent and increase slowly. Be mindful in your eating.  Continue to eat slowly, choose wisely, stop when you are full. Have a larger breakfast with carbohydrates and protein.  Aim for 2-3 Carb Choices per meal (30-45 grams) +/- 1 either way  Aim for 0-1 Carbs per snack if hungry  Include protein in moderation with your meals and snacks Consider reading food labels for Total Carbohydrate and Fat Grams of foods  Teaching Method Utilized:  Visual Auditory Hands on  Handouts given during visit include:  Diet, Exercise, and Diabetes  Living Well with Diabetes  Meal plan card  Label reading  Snack list  Breakfast ideas  My plate  Barriers to learning/adherence to lifestyle change: none  Demonstrated degree of understanding  via:  Teach Back   Monitoring/Evaluation:  Dietary intake, exercise, and body weight in 3 week(s).

## 2016-02-26 ENCOUNTER — Ambulatory Visit (INDEPENDENT_AMBULATORY_CARE_PROVIDER_SITE_OTHER): Payer: BLUE CROSS/BLUE SHIELD | Admitting: Internal Medicine

## 2016-02-26 ENCOUNTER — Encounter: Payer: Self-pay | Admitting: Internal Medicine

## 2016-02-26 ENCOUNTER — Ambulatory Visit: Payer: BLUE CROSS/BLUE SHIELD | Admitting: Dietician

## 2016-02-26 VITALS — BP 100/60 | HR 80 | Ht 60.5 in | Wt 214.2 lb

## 2016-02-26 DIAGNOSIS — E0590 Thyrotoxicosis, unspecified without thyrotoxic crisis or storm: Secondary | ICD-10-CM | POA: Diagnosis not present

## 2016-02-26 DIAGNOSIS — R7303 Prediabetes: Secondary | ICD-10-CM

## 2016-02-26 DIAGNOSIS — E042 Nontoxic multinodular goiter: Secondary | ICD-10-CM

## 2016-02-26 MED ORDER — METFORMIN HCL 500 MG PO TABS: 500.0000 mg | ORAL_TABLET | Freq: Two times a day (BID) | ORAL | Status: DC

## 2016-02-26 NOTE — Patient Instructions (Addendum)
Please stop at the lab.  Continue Methimazole 5 mg 2x a day.  Continue Metformin 500 mg 2x a day.  Please let me know what test strips you use.  Please come back for a follow-up appointment in 6 months

## 2016-02-26 NOTE — Progress Notes (Signed)
Patient ID: Alison Best, female   DOB: 1956-06-19, 60 y.o.   MRN: [id number]   HPI  Alison Best is a 60 y.o.-year-old female, returning for f/u for subclinical thyrotoxicosis and MNG and also prediabetes. Last OV was 6 mo ago.  She had colon resection in summer 2016, after an episode of diverticulitis >> now she is s/p colonostomy closure. She gained 35 lbs in last 4 mo after this surgery. She is seeing nutrition. She felt sugars started to increase - did not check them (!!) >> restarted Metformin.  Subclinical thyrotoxicosis: Reviewed hx: The thyroid tests were checked as she complained of neck pressure. She still feels this occasionally (upper neck).  I reviewed pt's thyroid tests: Lab Results  Component Value Date   TSH 0.60 12/29/2015   TSH 0.13* 08/06/2015   TSH 0.04* 07/07/2015   TSH 0.08* 06/03/2015   TSH 0.016* 02/25/2015   TSH 0.02* 09/16/2014   TSH 0.02* 06/18/2014   TSH 0.02* 05/23/2014   TSH 0.04* 04/22/2014   TSH 1.51 07/28/2012   FREET4 0.96 12/29/2015   FREET4 0.49* 08/06/2015   FREET4 1.14 09/16/2014   FREET4 0.97 06/18/2014   FREET4 1.12 04/22/2014   Component     Latest Ref Rng 06/18/2014  TSI     <140 % baseline 111   07/10/2014 Uptake and scan: The 24 hr uptake by the thyroid gland is 28.5%. Normal 24 hr uptakeis 10-30 %. On thyroid imaging, the thyroid activity appears mildly heterogeneous in the right lobe with a possible cold nodule inferiorly. The left lobe activity appears homogeneous.  We started methimazole 5 mg daily in a.m, then bid. She was noncompliant with the MMI dosing >> she had Afib when she was off the med. At last visit, we restarted 5 mg bid. She feels good on this.   Thyroid nodules: 07/15/2014 Thyroid U/S: Multiple bilateral thyroid nodules: poorly defined  Pt occasionally feels nodules in neck, but nono hoarseness, no dysphagia occasionally/no odynophagia, no SOB with lying down.  She c/o: - +++weight gain after her  surgery - + joint pain (bursitis) - no heat intolerance - no anxiety - no palpitations  - no tremors - no problems with concentration - no fatigue - no hyperdefecation/constipation   She was diagnosed with prediabetes last year:  Lab Results  Component Value Date   HGBA1C 6.2* 02/25/2015   HGBA1C 5.9 09/16/2014   HGBA1C 6.4 05/28/2014   She was on  Metformin 500 mg 2x a day >> stopped in Fall 2015 >> restarted Metformin 1-2x a day - in last 2 weeks.  She is not checking her sugars at home.  ROS: Constitutional: see HPI Eyes: no blurry vision, no xerophthalmia ENT: no sore throat, + see HPI Cardiovascular: no CP/SOB/+ palpitations/no leg swelling Respiratory: no cough/no SOB Gastrointestinal: no N/V/D/C Musculoskeletal: no muscle/joint aches Skin:no rash, + hair loss Neurological: no tremors/numbness/tingling/dizziness  I reviewed pt's medications, allergies, PMH, social hx, family hx, and changes were documented in the history of present illness. Otherwise, unchanged from my initial visit note.  PE: BP 100/60 mmHg  Pulse 80  Ht 5' 0.5" (1.537 m)  Wt 214 lb 3.2 oz (97.16 kg)  BMI 41.13 kg/m2  SpO2 97%  LMP 02/23/2012  Wt Readings from Last 3 Encounters:  02/26/16 214 lb 3.2 oz (97.16 kg)  02/05/16 208 lb (94.348 kg)  10/30/15 179 lb (81.194 kg)   Constitutional: overweight, in NAD Eyes: PERRLA, EOMI, no exophthalmos, no lid lag, no stare  ENT: moist mucous membranes, no thyromegaly, no thyroid bruits, no cervical lymphadenopathy Cardiovascular: RRR, No MRG Respiratory: CTA B Gastrointestinal: abdomen soft, NT, ND, BS+ Musculoskeletal: no deformities, strength intact in all 4 Skin: moist, warm, no rashes Neurological: no tremor with outstretched hands, DTR normal in all 4  ASSESSMENT: 1. Thyrotoxycosis  07/10/2014: Uptake and scan: 24 hr uptake by the thyroid gland is 28.5%. Normal 24 hr uptake is 10-30 %. On thyroid imaging, the thyroid activity appears  mildly heterogeneous in the right lobe with a possible cold nodule inferiorly. The left lobe activity appears homogeneous.  2. Multinodular goiter  07/15/2014 thyroid ultrasound:  Right thyroid lobe: 4.5 x 1.9 x 1.9 cm. Multiple nodules are noted throughout the right lobe of the thyroid. There is diffuse irregular margined hypoechoic nodule measuring 16 mm in the upper pole. Multiple smaller nodules are identified.  Left thyroid lobe: 4.4 x 1.7 x 1.9 cm. Multiple hypoechoic nodules are noted within the left lobe of the thyroid. The largest of these measures 15 mm in greatest dimension lying within the lower pole  Isthmus Thickness: 0.5 cm. No nodules visualized.  Lymphadenopathy None visualized.  IMPRESSION: Multiple bilateral thyroid nodules. The dominant nodules bilateral meet criteria for percutaneous biopsy.   3. Prediabetes   PLAN:  1. Patient with a history of subclinical hyperthyroidism, initially with few possible thyrotoxic sxs: heat intolerance, palpitations, which have improved despite a TSH remaining low. She did not start Laureles as recommended and did not return for an appt >> developed Afib. She only started MMI 5 mg 2x a day in 05/2015 >> a f/u TSH was still low >> we increased the dose to 10 mg in am and 5 mg in pm, and then slowly decreased to 5 mg bid. She takes this dose now - Her thyroid uptake and scan was negative for thyroiditis but she had some nodules in the right lobe which could have been slightly overactive  - continue Metoprolol XL 50 mg daily - we will check the TSH, fT3 and fT4 today  2. Multinodular goiter - We again reviewed the reports of her previous thyroid ultrasound that showed several nodules, with the largest one being eligible for FNA. However, she was hyper thyroid at the time of her ultrasound, and it is possible that the nodules were inflammatory (pseudonodules). Therefore, we decided to repeat the thyroid ultrasound in one year after she becomes  euthyroid.  - she feels the thyroid may be enlarged >> may need to repeat the U/S sooner  3. Prediabetes - We reviewed together her recent hemoglobin A1c:  4.8% (great!). - she is on metformin now: 500 mg bid - will continue and check a new HbA1c - advised her to start checking sugars 1x a day at home  Return in about 6 months (around 08/28/2016).  - time spent with the patient: 40 min, of which >50% was spent in obtaining information about her symptoms, reviewing her previous labs, evaluations, and treatments, counseling her about her conditions (please see the discussed topics above), and developing a plan to further investigate them. she had a number of questions which I addressed.  Office Visit on 02/26/2016  Component Date Value Ref Range Status  . T3, Free 02/26/2016 2.9  2.3 - 4.2 pg/mL Final  . Free T4 02/26/2016 0.50* 0.60 - 1.60 ng/dL Final  . TSH 02/26/2016 4.46  0.35 - 4.50 uIU/mL Final  . Hgb A1c MFr Bld 02/26/2016 6.0  4.6 - 6.5 % Final  Glycemic Control Guidelines for People with Diabetes:Non Diabetic:  <6%Goal of Therapy: <7%Additional Action Suggested:  >8%   . Sodium 02/26/2016 141  135 - 146 mmol/L Final  . Potassium 02/26/2016 4.2  3.5 - 5.3 mmol/L Final  . Chloride 02/26/2016 105  98 - 110 mmol/L Final  . CO2 02/26/2016 25  20 - 31 mmol/L Final  . Glucose, Bld 02/26/2016 110* 65 - 99 mg/dL Final  . BUN 02/26/2016 21  7 - 25 mg/dL Final  . Creat 02/26/2016 0.68  0.50 - 1.05 mg/dL Final  . Total Bilirubin 02/26/2016 0.3  0.2 - 1.2 mg/dL Final  . Alkaline Phosphatase 02/26/2016 83  33 - 130 U/L Final  . AST 02/26/2016 17  10 - 35 U/L Final  . ALT 02/26/2016 18  6 - 29 U/L Final  . Total Protein 02/26/2016 6.3  6.1 - 8.1 g/dL Final  . Albumin 02/26/2016 3.8  3.6 - 5.1 g/dL Final  . Calcium 02/26/2016 8.7  8.6 - 10.4 mg/dL Final  . GFR, Est African American 02/26/2016 >89  >=60 mL/min Final  . GFR, Est Non African American 02/26/2016 >89  >=60 mL/min Final    Comment:   The estimated GFR is a calculation valid for adults (>=3 years old) that uses the CKD-EPI algorithm to adjust for age and sex. It is   not to be used for children, pregnant women, hospitalized patients,    patients on dialysis, or with rapidly changing kidney function. According to the NKDEP, eGFR >89 is normal, 60-89 shows mild impairment, 30-59 shows moderate impairment, 15-29 shows severe impairment and <15 is ESRD.     Marland Kitchen Cholesterol 02/26/2016 247* 0 - 200 mg/dL Final   ATP III Classification       Desirable:  < 200 mg/dL               Borderline High:  200 - 239 mg/dL          High:  > = 240 mg/dL  . Triglycerides 02/26/2016 420.0 Triglyceride is over 400; calculations on Lipids are invalid.* 0.0 - 149.0 mg/dL Final   Normal:  <150 mg/dLBorderline High:  150 - 199 mg/dL  . HDL 02/26/2016 29.70* >39.00 mg/dL Final  . Total CHOL/HDL Ratio 02/26/2016 8   Final                  Men          Women1/2 Average Risk     3.4          3.3Average Risk          5.0          4.42X Average Risk          9.6          7.13X Average Risk          15.0          11.0                      . Hemoglobin A1C 07/30/2015 4.8   Final  . Direct LDL 02/26/2016 89.0   Final   Optimal:  <100 mg/dLNear or Above Optimal:  100-129 mg/dLBorderline High:  130-159 mg/dLHigh:  160-189 mg/dLVery High:  >190 mg/dL   HbA1c is higher, as expected. TSH is close to the upper limit of normal >> will decrease the methimazole to 5 mg daily and recheck her TFTs in 2 mo. LDL is at goal,  however, triglycerides are very high. Will advise to start watching her diet and eliminate concentrated sweets and fried foods.

## 2016-02-26 NOTE — Progress Notes (Signed)
Medical Nutrition Therapy:  Appt start time: 1700 end time:  [id number].   Assessment:  02/05/16 Primary concerns today: Patient is here alone today because of increased weight gain.  She would like to reduce her weight. She reports after the takedown surgery that her carbohydrate cravings have increased.  These decreased in the past when she was on Metformin and was also in [place] for a month eating a Mediterranean diet.   Hx includes:  Thyrotoxicosis, prediabetes and 11 months ago she had a severe bout of diverticulitis requiring part of her colon to be removed with colostomy takedown 09/2015. Her last HgbA1C was 6.2% 03/25/15.  She has also had OSA in the past.  She has gone to a couple Core Diabetes classes in the past at [place].  Patient lives with her husband and 18 year son.  She is also helping care for both sets of elderly parents.  Patient does most of the shopping and cooking but husband and son will help at times.  She works for [employer] and does [employer].  Weight hx: Lowest recent adult weight 148 lbs in June 2017 Highest 220 lbs fall 2015.  Follow up 02/27/16: Patient is here alone to follow up with Dr. Cruzita Lederer and then myself.  She states that she is having increased problems with bursitis and is frustrated with increased cravings and weight gain.  She got her last steroid injection in February or March.  She states that she has increased breakfast and added the metformin and has an occasional dessert.  Her last A1C was 4.8% 07/30/15.    Preferred Learning Style:   No preference indicated   Learning Readiness:   Ready  Change in progress   MEDICATIONS: see list to include a supplement which contains tumeric, resveritrol and CoQ10.   DIETARY INTAKE: (april)  Usual eating pattern includes 2-3 meals and 2-3 snacks per day. Avoided foods include artificial sweeteners.    24-hr recall:  B ( AM): plain black tea, [employer] toast with peanut butter OR  string cheese Snk ( AM): none  L ( PM): "varied" a lot of lunch meetings (catered or restaurants or on her own - orders take out, toast or soup) Snk ( PM): occasional- pretzels or other carb or fruit D ( PM): quiche with toast with or without "pirate's booty" snack OR salads with chicken Snk ( PM): occasional ice cream or cookies if it is the house.  More recently. Beverages: unsweetened black tea, water, OJ (6-12 ounces per day)  Usual physical activity:  enjoys walking- has been parking father away, using stairs at work.  Recent surgery in December.    Estimated energy needs: 1500 calories 170 g carbohydrates 112 g protein 42 g fat  Progress Towards Goal(s):  In progress.   Nutritional Diagnosis:  NB-1.1 Food and nutrition-related knowledge deficit As related to balance of carbohydrates, protein, and fat.  As evidenced by diet hx.    Intervention:  Nutrition counseling/education continued.  Be as active as possible.  Consistent and increase slowly. Be mindful in your eating.  Continue to eat slowly, choose wisely, stop when you are full. Have a larger breakfast with carbohydrates and protein.  Aim for 2-3 Carb Choices per meal (30-45 grams) +/- 1 either way  Aim for 0-1 Carbs per snack if hungry  Include protein in moderation with your meals and snacks Consider reading food labels for Total Carbohydrate and Fat Grams of foods  Teaching Method Utilized:  Visual Auditory  Hands on  Handouts given during visit include:  Diet, Exercise, and Diabetes  Living Well with Diabetes  Meal plan card  Label reading  Snack list  Breakfast ideas  My plate  Barriers to learning/adherence to lifestyle change: none  Demonstrated degree of understanding via:  Teach Back   Monitoring/Evaluation:  Dietary intake, exercise, and body weight prn

## 2016-02-26 NOTE — Progress Notes (Signed)
Pre visit review using our clinic review tool, if applicable. No additional management support is needed unless otherwise documented below in the visit note. 

## 2016-02-27 DIAGNOSIS — E042 Nontoxic multinodular goiter: Secondary | ICD-10-CM | POA: Insufficient documentation

## 2016-02-27 LAB — COMPLETE METABOLIC PANEL WITH GFR
ALBUMIN: 3.8 g/dL (ref 3.6–5.1)
ALK PHOS: 83 U/L (ref 33–130)
ALT: 18 U/L (ref 6–29)
AST: 17 U/L (ref 10–35)
BUN: 21 mg/dL (ref 7–25)
CALCIUM: 8.7 mg/dL (ref 8.6–10.4)
CO2: 25 mmol/L (ref 20–31)
CREATININE: 0.68 mg/dL (ref 0.50–1.05)
Chloride: 105 mmol/L (ref 98–110)
GFR, Est Non African American: >89 mL/min (ref >=60)
Glucose, Bld: 110 mg/dL — ABNORMAL HIGH (ref 65–99)
Potassium: 4.2 mmol/L (ref 3.5–5.3)
Sodium: 141 mmol/L (ref 135–146)
Total Bilirubin: 0.3 mg/dL (ref 0.2–1.2)
Total Protein: 6.3 g/dL (ref 6.1–8.1)

## 2016-02-27 LAB — T3, FREE: T3, Free: 2.9 pg/mL (ref 2.3–4.2)

## 2016-02-27 LAB — HEMOGLOBIN A1C: Hgb A1c MFr Bld: 6 % (ref 4.6–6.5)

## 2016-02-27 LAB — LIPID PANEL
CHOL/HDL RATIO: 8
CHOLESTEROL: 247 mg/dL — AB (ref 0–200)
HDL: 29.7 mg/dL — ABNORMAL LOW (ref >39.00)
Triglycerides: 420 mg/dL — ABNORMAL HIGH (ref 0.0–149.0)

## 2016-02-27 LAB — T4, FREE: FREE T4: 0.5 ng/dL — AB (ref 0.60–1.60)

## 2016-02-27 LAB — TSH: TSH: 4.46 u[IU]/mL (ref 0.35–4.50)

## 2016-02-27 LAB — LDL CHOLESTEROL, DIRECT: Direct LDL: 89 mg/dL

## 2016-03-03 ENCOUNTER — Encounter: Payer: Self-pay | Admitting: Internal Medicine

## 2016-03-03 DIAGNOSIS — K432 Incisional hernia without obstruction or gangrene: Secondary | ICD-10-CM | POA: Diagnosis not present

## 2016-03-03 MED ORDER — METHIMAZOLE 5 MG PO TABS: ORAL_TABLET | ORAL | Status: DC

## 2016-03-04 ENCOUNTER — Other Ambulatory Visit: Payer: Self-pay | Admitting: *Deleted

## 2016-03-04 MED ORDER — METFORMIN HCL 500 MG PO TABS: 500.0000 mg | ORAL_TABLET | Freq: Two times a day (BID) | ORAL | Status: DC

## 2016-03-04 NOTE — Telephone Encounter (Signed)
Refill request for metformin via MyChart.

## 2016-03-15 ENCOUNTER — Ambulatory Visit (INDEPENDENT_AMBULATORY_CARE_PROVIDER_SITE_OTHER): Payer: BLUE CROSS/BLUE SHIELD | Admitting: Cardiology

## 2016-03-15 ENCOUNTER — Encounter: Payer: Self-pay | Admitting: Cardiology

## 2016-03-15 VITALS — BP 140/74 | HR 64 | Ht 60.5 in | Wt 215.0 lb

## 2016-03-15 DIAGNOSIS — R0989 Other specified symptoms and signs involving the circulatory and respiratory systems: Secondary | ICD-10-CM | POA: Diagnosis not present

## 2016-03-15 DIAGNOSIS — R011 Cardiac murmur, unspecified: Secondary | ICD-10-CM | POA: Diagnosis not present

## 2016-03-15 DIAGNOSIS — I480 Paroxysmal atrial fibrillation: Secondary | ICD-10-CM

## 2016-03-15 DIAGNOSIS — R002 Palpitations: Secondary | ICD-10-CM | POA: Diagnosis not present

## 2016-03-15 NOTE — Patient Instructions (Signed)
Medication Instructions:  Your physician recommends that you continue on your current medications as directed. Please refer to the Current Medication list given to you today.   Labwork: None ordered  Testing/Procedures: None ordered  Follow-Up: Your physician wants you to follow-up in: [place] DR. Angelena Form  You will receive a reminder letter in the mail two months in advance. If you don't receive a letter, please call our office to schedule the follow-up appointment.    Any Other Special Instructions Will Be Listed Below (If Applicable).  Steps to Quit Smoking  Smoking tobacco can be harmful to your health and can affect almost every organ in your body. Smoking puts you, and those around you, at risk for developing many serious chronic diseases. Quitting smoking is difficult, but it is one of the best things that you can do for your health. It is never too late to quit. WHAT ARE THE BENEFITS OF QUITTING SMOKING? When you quit smoking, you lower your risk of developing serious diseases and conditions, such as:  Lung cancer or lung disease, such as COPD.  Heart disease.  Stroke.  Heart attack.  Infertility.  Osteoporosis and bone fractures. Additionally, symptoms such as coughing, wheezing, and shortness of breath may get better when you quit. You may also find that you get sick less often because your body is stronger at fighting off colds and infections. If you are pregnant, quitting smoking can help to reduce your chances of having a baby of low birth weight. HOW DO I GET READY TO QUIT? When you decide to quit smoking, create a plan to make sure that you are successful. Before you quit:  Pick a date to quit. Set a date within the next two weeks to give you time to prepare.  Write down the reasons why you are quitting. Keep this list in places where you will see it often, such as on your bathroom mirror or in your car or wallet.  Identify the people, places, things, and  activities that make you want to smoke (triggers) and avoid them. Make sure to take these actions:  Throw away all cigarettes at home, at work, and in your car.  Throw away smoking accessories, such as Scientist, research (medical).  Clean your car and make sure to empty the ashtray.  Clean your home, including curtains and carpets.  Tell your family, friends, and coworkers that you are quitting. Support from your loved ones can make quitting easier.  Talk with your health care provider about your options for quitting smoking.  Find out what treatment options are covered by your health insurance. WHAT STRATEGIES CAN I USE TO QUIT SMOKING?  Talk with your healthcare provider about different strategies to quit smoking. Some strategies include:  Quitting smoking altogether instead of gradually lessening how much you smoke over a period of time. Research shows that quitting "cold [place]" is more successful than gradually quitting.  Attending in-person counseling to help you build problem-solving skills. You are more likely to have success in quitting if you attend several counseling sessions. Even short sessions of 10 minutes can be effective.  Finding resources and support systems that can help you to quit smoking and remain smoke-free after you quit. These resources are most helpful when you use them often. They can include:  Online chats with a Social worker.  Telephone quitlines.  Printed Furniture conservator/restorer.  Support groups or group counseling.  Text messaging programs.  Mobile phone applications.  Taking medicines to help you  quit smoking. (If you are pregnant or breastfeeding, talk with your health care provider first.) Some medicines contain nicotine and some do not. Both types of medicines help with cravings, but the medicines that include nicotine help to relieve withdrawal symptoms. Your health care provider may recommend:  Nicotine patches, gum, or lozenges.  Nicotine inhalers or  sprays.  Non-nicotine medicine that is taken by mouth. Talk with your health care provider about combining strategies, such as taking medicines while you are also receiving in-person counseling. Using these two strategies together makes you more likely to succeed in quitting than if you used either strategy on its own. If you are pregnant or breastfeeding, talk with your health care provider about finding counseling or other support strategies to quit smoking. Do not take medicine to help you quit smoking unless told to do so by your health care provider. WHAT THINGS CAN I DO TO MAKE IT EASIER TO QUIT? Quitting smoking might feel overwhelming at first, but there is a lot that you can do to make it easier. Take these important actions:  Reach out to your family and friends and ask that they support and encourage you during this time. Call telephone quitlines, reach out to support groups, or work with a counselor for support.  Ask people who smoke to avoid smoking around you.  Avoid places that trigger you to smoke, such as bars, parties, or smoke-break areas at work.  Spend time around people who do not smoke.  Lessen stress in your life, because stress can be a smoking trigger for some people. To lessen stress, try:  Exercising regularly.  Deep-breathing exercises.  Yoga.  Meditating.  Performing a body scan. This involves closing your eyes, scanning your body from head to toe, and noticing which parts of your body are particularly tense. Purposefully relax the muscles in those areas.  Download or purchase mobile phone or tablet apps (applications) that can help you stick to your quit plan by providing reminders, tips, and encouragement. There are many free apps, such as QuitGuide from the [employer] Office manager for Disease Control and Prevention). You can find other support for quitting smoking (smoking cessation) through smokefree.gov and other websites. HOW WILL I FEEL WHEN I QUIT  SMOKING? Within the first 24 hours of quitting smoking, you may start to feel some withdrawal symptoms. These symptoms are usually most noticeable 2-3 days after quitting, but they usually do not last beyond 2-3 weeks. Changes or symptoms that you might experience include:  Mood swings.  Restlessness, anxiety, or irritation.  Difficulty concentrating.  Dizziness.  Strong cravings for sugary foods in addition to nicotine.  Mild weight gain.  Constipation.  Nausea.  Coughing or a sore throat.  Changes in how your medicines work in your body.  A depressed mood.  Difficulty sleeping (insomnia). After the first 2-3 weeks of quitting, you may start to notice more positive results, such as:  Improved sense of smell and taste.  Decreased coughing and sore throat.  Slower heart rate.  Lower blood pressure.  Clearer skin.  The ability to breathe more easily.  Fewer sick days. Quitting smoking is very challenging for most people. Do not get discouraged if you are not successful the first time. Some people need to make many attempts to quit before they achieve long-term success. Do your best to stick to your quit plan, and talk with your health care provider if you have any questions or concerns.   This information is not intended to  replace advice given to you by your health care provider. Make sure you discuss any questions you have with your health care provider.   Document Released: 10/05/2001 Document Revised: 02/25/2015 Document Reviewed: 02/25/2015 Elsevier Interactive Patient Education [employer].   If you need a refill on your cardiac medications before your next appointment, please call your pharmacy.

## 2016-03-15 NOTE — Progress Notes (Signed)
03/15/2016 [place]   [birth year]  [id number]  Primary Physician Laurey Morale, MD Primary Cardiologist: Dr. Huel Cote   Reason for Visit/CC: F/u for PAF  HPI:  60 y/o female, followed by Dr. Angelena Form, who presents to clinic for routine 6 month f/u. She has a h/o hyperlipidemia, sleep apnea, hyperthryoidism and former tobacco abuse (1/4 ppd for 40 years). She was referred to Dr. Angelena Form in 2016 for an abnormal EKG. She was hospitalized at [facility] May 2016 with perforated sigmoid diverticulitis with intra-abdominal abscesses and underwent emergency exploratory laparotomy and drainage of intra-abdominal abscesses, salpingo oophorectomy, descending colostomy, sigmoid colectomy, cystoscopy. During her hospitalization her EKG showed T wave inversions and ST depression. Dr. Angelena Form saw her in the office in July 2016 and arranged an echo and a holter monitor. Echo with normal LV function and TR with no other valve disease. Stress myoview 05/28/15 with no ischemia. When she presented for her echo she was in rapid atrial fib. She was started on Toprol and ASA given CHADS VASC score of 1. She is being treated for hyperthyroidism. Atrial fibrillation was noted on her 48 hour monitor. Carotid dopplers August 2016 with mild bilateral carotid disease. She had colostomy closure 09/2015 and had done well.   Today in f/u, she reports that she has done well. She denies any symptoms of breakthrough afib. No palpitations, CP, dyspnea, dizziness, syncope/ near syncope. She is compliant with metoprolol and ASA. Since her last visit, she has restarted smoking. She started back about 4 months ago and smokes about 1/4 ppd.    Current Outpatient Prescriptions  Medication Sig Dispense Refill  . acetaminophen (TYLENOL) 325 MG tablet Take 2 tablets (650 mg total) by mouth every 6 (six) hours as needed for moderate pain or headache.    . albuterol (PROAIR HFA) 108 (90 BASE) MCG/ACT inhaler Inhale 2 puffs into the lungs  every 4 (four) hours as needed for wheezing. 1 Inhaler 11  . aspirin 81 MG tablet Take 81 mg by mouth daily.    . furosemide (LASIX) 20 MG tablet TAKE 1 TABLET BY MOUTH DAILY AS NEEDED (FLUID) 30 tablet 11  . metFORMIN (GLUCOPHAGE) 500 MG tablet Take 1 tablet (500 mg total) by mouth 2 (two) times daily with a meal. 180 tablet 3  . methimazole (TAPAZOLE) 5 MG tablet TAKE 1 TABLET(5 MG) BY MOUTH ONCE DAILY 60 tablet 2  . metoprolol succinate (TOPROL-XL) 50 MG 24 hr tablet TAKE 1 TABLET BY MOUTH EVERY DAY WITH OR IMMEDIATELY FOLLOWING A MEAL 30 tablet 3  . Multiple Vitamins-Minerals (MULTIVITAMIN WITH MINERALS) tablet Take 1 tablet by mouth daily.     No current facility-administered medications for this visit.    Allergies  Allergen Reactions  . Bupropion Nausea And Vomiting  . Levaquin [Levofloxacin] Other (See Comments)    Severe tendonitis Tolerates Cipro    Social History   Social History  . Marital Status: Married    Spouse Name: N/A  . Number of Children: 1  . Years of Education: N/A   Occupational History  . Equities trader at [employer]  . Smoking status: Current Every Day Smoker -- 0.25 packs/day for 40 years    Types: Cigarettes    Last Attempt to Quit: 02/07/2015  . Smokeless tobacco: Never Used     Comment: less than 1/2 pack a day  . Alcohol Use: 0.0 oz/week    0 Standard drinks or equivalent per week  Comment: occ  . Drug Use: No  . Sexual Activity: Yes   Other Topics Concern  . Not on file   Social History Narrative     Review of Systems: General: negative for chills, fever, night sweats or weight changes.  Cardiovascular: negative for chest pain, dyspnea on exertion, edema, orthopnea, palpitations, paroxysmal nocturnal dyspnea or shortness of breath Dermatological: negative for rash Respiratory: negative for cough or wheezing Urologic: negative for hematuria Abdominal: negative for nausea, vomiting,  diarrhea, bright red blood per rectum, melena, or hematemesis Neurologic: negative for visual changes, syncope, or dizziness All other systems reviewed and are otherwise negative except as noted above.    Height 5' 0.5" (1.537 m), weight 215 lb (97.523 kg), last menstrual period 02/23/2012.  General appearance: alert, cooperative, no distress and obese Neck: no carotid bruit and no JVD Lungs: clear to auscultation bilaterally Heart: regular rate and rhythm, S1, S2 normal, no murmur, click, rub or gallop Abdomen: soft, non-tender; bowel sounds normal; no masses,  no organomegaly Extremities: no LEE Pulses: 2+ and symmetric Skin: warm and dry Neurologic: Grossly normal  EKG NSR. HR 66 bpm   ASSESSMENT AND PLAN:   1. Abnormal EKG: She did have an abnormal EKG while under metabolic stress in may 2016 with anemia. Stress myoview 05/28/15 without ischemia. Echo with normal LV function and wall motion. She denies any CP. No further ischemic workup planned at this time.  2. PAF: EKG today shows NSR. HR well controlled with metoprolol at 66 bpm. She denies any breakthrough symptoms of atrial fibrillation. Her CHA2DS2 VASc score is 1. Continue ASA daily.   3. Carotid Artery Disease: Mild bilateral disease by dopplers August 2016. She denies any dizziness, syncope/ near syncope or symptoms of stroke/ TIA. Continue ASA.   4. Hyperthyroidism: followed by her endocrinologist.   5. Tobacco Use: patient resumed smoking about 4 months ago. Smoking cessation encouraged.   PLAN  F/u with Dr. Angelena Form in 6 months.   Lyda Jester PA-C 03/15/2016 9:19 AM

## 2016-05-05 ENCOUNTER — Other Ambulatory Visit: Payer: BLUE CROSS/BLUE SHIELD

## 2016-05-10 ENCOUNTER — Ambulatory Visit (INDEPENDENT_AMBULATORY_CARE_PROVIDER_SITE_OTHER): Payer: BLUE CROSS/BLUE SHIELD | Admitting: Family Medicine

## 2016-05-10 ENCOUNTER — Encounter: Payer: Self-pay | Admitting: Family Medicine

## 2016-05-10 VITALS — BP 98/68 | HR 67 | Temp 98.7°F | Ht 60.5 in | Wt 223.0 lb

## 2016-05-10 DIAGNOSIS — J4530 Mild persistent asthma, uncomplicated: Secondary | ICD-10-CM

## 2016-05-10 DIAGNOSIS — J209 Acute bronchitis, unspecified: Secondary | ICD-10-CM | POA: Diagnosis not present

## 2016-05-10 DIAGNOSIS — L719 Rosacea, unspecified: Secondary | ICD-10-CM | POA: Diagnosis not present

## 2016-05-10 MED ORDER — METRONIDAZOLE 1 % EX GEL: Freq: Every day | CUTANEOUS | Status: DC

## 2016-05-10 MED ORDER — AZITHROMYCIN 250 MG PO TABS: ORAL_TABLET | ORAL | Status: DC

## 2016-05-10 MED ORDER — ALBUTEROL SULFATE HFA 108 (90 BASE) MCG/ACT IN AERS: 2.0000 | INHALATION_SPRAY | RESPIRATORY_TRACT | Status: DC | PRN

## 2016-05-10 NOTE — Progress Notes (Signed)
Pre visit review using our clinic review tool, if applicable. No additional management support is needed unless otherwise documented below in the visit note. 

## 2016-05-10 NOTE — Progress Notes (Signed)
   Subjective:    Patient ID: Alison Best, female    DOB: 06-20-1956, 60 y.o.   MRN: [id number]  HPI Here for 4 days of chest congestion, wheezing and coughing up green sputum. No fever. On Mucinex.    Review of Systems  Constitutional: Negative.   HENT: Positive for congestion and postnasal drip. Negative for sinus pressure and sore throat.   Eyes: Negative.   Respiratory: Positive for cough, chest tightness and wheezing. Negative for shortness of breath.   Cardiovascular: Negative.        Objective:   Physical Exam  Constitutional: She appears well-developed and well-nourished.  HENT:  Right Ear: External ear normal.  Left Ear: External ear normal.  Nose: Nose normal.  Mouth/Throat: Oropharynx is clear and moist.  Eyes: Conjunctivae are normal.  Neck: No thyromegaly present.  Cardiovascular: Normal rate, regular rhythm, normal heart sounds and intact distal pulses.   Pulmonary/Chest: Effort normal. No respiratory distress. She has no rales.  Scattered wheezes and rhonchi   Lymphadenopathy:    She has no cervical adenopathy.          Assessment & Plan:  Bronchitis, treat with a Zpack.

## 2016-05-12 ENCOUNTER — Encounter: Payer: Self-pay | Admitting: Internal Medicine

## 2016-05-12 ENCOUNTER — Other Ambulatory Visit (INDEPENDENT_AMBULATORY_CARE_PROVIDER_SITE_OTHER): Payer: BLUE CROSS/BLUE SHIELD

## 2016-05-12 DIAGNOSIS — E0590 Thyrotoxicosis, unspecified without thyrotoxic crisis or storm: Secondary | ICD-10-CM

## 2016-05-12 LAB — T4, FREE: Free T4: 0.66 ng/dL (ref 0.60–1.60)

## 2016-05-12 LAB — T3, FREE: T3, Free: 2.8 pg/mL (ref 2.3–4.2)

## 2016-05-12 LAB — TSH: TSH: 0.88 u[IU]/mL (ref 0.35–4.50)

## 2016-05-15 ENCOUNTER — Other Ambulatory Visit: Payer: Self-pay | Admitting: Cardiovascular Disease

## 2016-05-27 ENCOUNTER — Telehealth: Payer: Self-pay | Admitting: Family Medicine

## 2016-05-27 NOTE — Telephone Encounter (Signed)
Pt seen 7/17, dx with bronchitis. Pt states she is better, but symptoms still persist and she is not over this. Pt will be traveling and would like to know if you can prescribe something else.  [facility]/ [address]  ALSO Pt states her  EPI PEN has expired.

## 2016-05-27 NOTE — Telephone Encounter (Signed)
Call in Augmentin [phone] to take bid #20, also a double pack of EpiPen to use prn, #1 with 5 rf

## 2016-05-28 MED ORDER — AMOXICILLIN-POT CLAVULANATE 875-125 MG PO TABS: 1.0000 | ORAL_TABLET | Freq: Two times a day (BID) | ORAL | 0 refills | Status: DC

## 2016-05-28 MED ORDER — EPINEPHRINE 0.3 MG/0.3ML IJ SOAJ: 0.3000 mg | Freq: Once | INTRAMUSCULAR | 5 refills | Status: AC

## 2016-05-28 NOTE — Telephone Encounter (Signed)
I sent both scripts e-scribe to [facility] and left a voice message with this information.

## 2016-07-02 DIAGNOSIS — M79672 Pain in left foot: Secondary | ICD-10-CM | POA: Diagnosis not present

## 2016-07-02 DIAGNOSIS — M25572 Pain in left ankle and joints of left foot: Secondary | ICD-10-CM | POA: Diagnosis not present

## 2016-07-27 DIAGNOSIS — Z23 Encounter for immunization: Secondary | ICD-10-CM | POA: Diagnosis not present

## 2016-08-20 DIAGNOSIS — M7061 Trochanteric bursitis, right hip: Secondary | ICD-10-CM | POA: Diagnosis not present

## 2016-09-16 ENCOUNTER — Other Ambulatory Visit: Payer: Self-pay | Admitting: Cardiovascular Disease

## 2016-09-19 ENCOUNTER — Other Ambulatory Visit: Payer: Self-pay | Admitting: Internal Medicine

## 2016-09-19 DIAGNOSIS — E0590 Thyrotoxicosis, unspecified without thyrotoxic crisis or storm: Secondary | ICD-10-CM

## 2016-09-20 ENCOUNTER — Other Ambulatory Visit: Payer: Self-pay | Admitting: Internal Medicine

## 2016-09-20 DIAGNOSIS — E0590 Thyrotoxicosis, unspecified without thyrotoxic crisis or storm: Secondary | ICD-10-CM

## 2016-09-29 ENCOUNTER — Encounter: Payer: Self-pay | Admitting: Adult Health

## 2016-09-29 ENCOUNTER — Ambulatory Visit (INDEPENDENT_AMBULATORY_CARE_PROVIDER_SITE_OTHER): Payer: BLUE CROSS/BLUE SHIELD | Admitting: Adult Health

## 2016-09-29 ENCOUNTER — Encounter: Payer: Self-pay | Admitting: Internal Medicine

## 2016-09-29 VITALS — BP 110/64 | Temp 98.6°F | Ht 60.5 in | Wt 239.0 lb

## 2016-09-29 DIAGNOSIS — J0141 Acute recurrent pansinusitis: Secondary | ICD-10-CM

## 2016-09-29 MED ORDER — DOXYCYCLINE HYCLATE 100 MG PO CAPS: 100.0000 mg | ORAL_CAPSULE | Freq: Two times a day (BID) | ORAL | 0 refills | Status: DC

## 2016-09-29 NOTE — Progress Notes (Signed)
Subjective:    Patient ID: Alison Best, female    DOB: 09/21/1956, 60 y.o.   MRN: [id number]  Sinusitis  This is a new problem. The current episode started 1 to 4 weeks ago. The problem has been gradually worsening since onset. The maximum temperature recorded prior to her arrival was 100.4 - 100.9 F (subjective ). The fever has been present for 1 to 2 days. Associated symptoms include chills, congestion, coughing, ear pain, headaches and sinus pressure. Pertinent negatives include no sore throat. Past treatments include antibiotics (tried a few days of amoxicillin that she had left over ). The treatment provided no relief.    Review of Systems  Constitutional: Positive for chills, fatigue and fever.  HENT: Positive for congestion, ear pain, rhinorrhea, sinus pain and sinus pressure. Negative for sore throat and trouble swallowing.   Eyes: Negative.   Respiratory: Positive for cough.   Cardiovascular: Negative.   Neurological: Positive for headaches.  Hematological: Positive for adenopathy.   Past Medical History:  Diagnosis Date  . Arthritis   . Bursitis   . Diverticulitis of colon 02/2015  . Ectopic pregnancy   . Heart murmur   . Hyperlipidemia   . Hyperthyroidism   . Irregular heartbeat   . Obesity   . Sleep apnea   . Thyroid disease    hyperthyroid    Social History   Social History  . Marital status: Married    Spouse name: N/A  . Number of children: 1  . Years of education: N/A   Occupational History  . Equities trader at [employer]  . Smoking status: Current Every Day Smoker    Packs/day: 0.25    Years: 40.00    Types: Cigarettes    Last attempt to quit: 02/07/2015  . Smokeless tobacco: Never Used     Comment: less than 1/2 pack a day  . Alcohol use 0.0 oz/week     Comment: occ  . Drug use: No  . Sexual activity: Yes   Other Topics Concern  . Not on file   Social History  Narrative  . No narrative on file    Past Surgical History:  Procedure Laterality Date  . APPENDECTOMY    . CESAREAN SECTION    . COLONOSCOPY  09-10-10   per Dr. Acquanetta Sit, diverticulosis of descending colon and sigmoid, internal hemorrhoids, repeat in 10 yrs  . COLOSTOMY N/A 02/20/2015   Procedure: COLOSTOMY;  Surgeon: Jackolyn Confer, MD;  Location: [facility];  Service: General;  Laterality: N/A;  . COLOSTOMY REVISION  02/20/2015   Procedure: Sigmoid Colectomy;  Surgeon: Jackolyn Confer, MD;  Location: [facility];  Service: General;;  . CYSTOSCOPY N/A 02/20/2015   Procedure: Consuela Mimes;  Surgeon: Kathie Rhodes, MD;  Location: [facility];  Service: Urology;  Laterality: N/A;  . ILEO LOOP COLOSTOMY CLOSURE N/A 09/26/2015   Procedure: LAPAROSCOPIC LYSIS OF ADHESIONS, COLOSTOMY CLOSURE;  Surgeon: Jackolyn Confer, MD;  Location: [facility];  Service: General;  Laterality: N/A;  . LAPAROTOMY N/A 02/20/2015   Procedure: Emergency EXPLORATORY and drainiage of interabdominal abcesses;  Surgeon: Jackolyn Confer, MD;  Location: [facility];  Service: General;  Laterality: N/A;  . SALPINGOOPHORECTOMY Left 02/20/2015   Procedure: SALPINGO OOPHORECTOMY;  Surgeon: Jackolyn Confer, MD;  Location: [facility];  Service: General;  Laterality: Left;  . TONSILLECTOMY  1980  . TONSILLECTOMY      Family History  Problem Relation Age of Onset  .  Cancer Mother   . COPD Other   . Allergies Brother   . CAD Father     CABG  . Cancer Maternal Grandmother   . Skin cancer Father   . Heart attack Maternal Grandfather   . Stroke Neg Hx   . Hypertension Neg Hx   . Heart attack Paternal Grandfather     Allergies  Allergen Reactions  . Bupropion Nausea And Vomiting  . Levaquin [Levofloxacin] Other (See Comments)    Severe tendonitis Tolerates Cipro    Current Outpatient Prescriptions on File Prior to Visit  Medication Sig Dispense Refill  . acetaminophen (TYLENOL) 325 MG tablet Take 2 tablets (650 mg total) by mouth every 6 (six)  hours as needed for moderate pain or headache.    . albuterol (PROAIR HFA) 108 (90 Base) MCG/ACT inhaler Inhale 2 puffs into the lungs every 4 (four) hours as needed for wheezing. 1 Inhaler 5  . amoxicillin-clavulanate (AUGMENTIN) 875-125 MG tablet Take 1 tablet by mouth 2 (two) times daily. 20 tablet 0  . aspirin 81 MG tablet Take 81 mg by mouth daily.    Marland Kitchen azithromycin (ZITHROMAX) 250 MG tablet As directed 6 tablet 0  . furosemide (LASIX) 20 MG tablet TAKE 1 TABLET BY MOUTH DAILY AS NEEDED (FLUID) 30 tablet 11  . metFORMIN (GLUCOPHAGE) 500 MG tablet Take 1 tablet (500 mg total) by mouth 2 (two) times daily with a meal. 180 tablet 3  . methimazole (TAPAZOLE) 5 MG tablet TAKE 1 TABLET(5 MG) BY MOUTH EVERY DAY 60 tablet 0  . metoprolol succinate (TOPROL-XL) 50 MG 24 hr tablet TAKE 1 TABLET BY MOUTH EVERY DAY WITH OR IMMEDIATELY FOLLOWING A MEAL 30 tablet 5  . metroNIDAZOLE (METROGEL) 1 % gel Apply topically daily. 60 g 5  . Multiple Vitamins-Minerals (MULTIVITAMIN WITH MINERALS) tablet Take 1 tablet by mouth daily.    . TURMERIC PO Take by mouth daily.     No current facility-administered medications on file prior to visit.     BP 110/64   Temp 98.6 F (37 C) (Oral)   Ht 5' 0.5" (1.537 m)   Wt 239 lb (108.4 kg)   LMP 02/23/2012   BMI 45.91 kg/m       Objective:   Physical Exam  Constitutional: She appears well-developed and well-nourished. No distress.  HENT:  Head: Normocephalic and atraumatic.  Right Ear: External ear normal.  Left Ear: External ear normal.  Nose: Nose normal.  Mouth/Throat: Oropharynx is clear and moist. No oropharyngeal exudate.  Eyes: Conjunctivae and EOM are normal. Pupils are equal, round, and reactive to light. Right eye exhibits no discharge. Left eye exhibits no discharge.  Neck: Normal range of motion. Neck supple.  Cardiovascular: Normal rate, regular rhythm, normal heart sounds and intact distal pulses.  Exam reveals no gallop.   No murmur  heard. Pulmonary/Chest: Effort normal and breath sounds normal. No respiratory distress. She has no wheezes. She has no rales. She exhibits no tenderness.  Lymphadenopathy:    She has no cervical adenopathy.  Skin: Skin is warm and dry. No rash noted. She is not diaphoretic. No erythema. No pallor.  Psychiatric: She has a normal mood and affect. Her behavior is normal. Judgment and thought content normal.  Nursing note and vitals reviewed.     Assessment & Plan:  1. Acute recurrent pansinusitis - doxycycline (VIBRAMYCIN) 100 MG capsule; Take 1 capsule (100 mg total) by mouth 2 (two) times daily.  Dispense: 20 capsule; Refill: 0 -  Flonase and Mucinex - Follow up if no improvement   Dorothyann Peng, NP

## 2016-10-01 ENCOUNTER — Other Ambulatory Visit (INDEPENDENT_AMBULATORY_CARE_PROVIDER_SITE_OTHER): Payer: BLUE CROSS/BLUE SHIELD

## 2016-10-01 ENCOUNTER — Other Ambulatory Visit: Payer: Self-pay | Admitting: Internal Medicine

## 2016-10-01 ENCOUNTER — Encounter: Payer: Self-pay | Admitting: Internal Medicine

## 2016-10-01 DIAGNOSIS — E0581 Other thyrotoxicosis with thyrotoxic crisis or storm: Secondary | ICD-10-CM

## 2016-10-01 DIAGNOSIS — R7303 Prediabetes: Secondary | ICD-10-CM

## 2016-10-01 DIAGNOSIS — E0590 Thyrotoxicosis, unspecified without thyrotoxic crisis or storm: Secondary | ICD-10-CM

## 2016-10-01 LAB — HEMOGLOBIN A1C: HEMOGLOBIN A1C: 6.2 % (ref 4.6–6.5)

## 2016-10-01 LAB — TSH: TSH: 0.06 u[IU]/mL — AB (ref 0.35–4.50)

## 2016-10-01 LAB — T3, FREE: T3, Free: 4.1 pg/mL (ref 2.3–4.2)

## 2016-10-01 LAB — T4, FREE: Free T4: 1.18 ng/dL (ref 0.60–1.60)

## 2016-10-01 MED ORDER — METHIMAZOLE 5 MG PO TABS: ORAL_TABLET | ORAL | 2 refills | Status: DC

## 2016-10-01 NOTE — Addendum Note (Signed)
Addended by: Kaylyn Lim I on: 10/01/2016 04:07 PM   Modules accepted: Orders

## 2016-10-12 ENCOUNTER — Other Ambulatory Visit: Payer: Self-pay

## 2016-10-12 ENCOUNTER — Encounter: Payer: Self-pay | Admitting: Family Medicine

## 2016-10-12 ENCOUNTER — Encounter: Payer: Self-pay | Admitting: Internal Medicine

## 2016-10-12 DIAGNOSIS — E0590 Thyrotoxicosis, unspecified without thyrotoxic crisis or storm: Secondary | ICD-10-CM

## 2016-10-12 MED ORDER — METHIMAZOLE 5 MG PO TABS: ORAL_TABLET | ORAL | 2 refills | Status: DC

## 2016-10-13 ENCOUNTER — Other Ambulatory Visit: Payer: Self-pay | Admitting: Family Medicine

## 2016-10-13 MED ORDER — AZITHROMYCIN 250 MG PO TABS: ORAL_TABLET | ORAL | 0 refills | Status: DC

## 2016-10-13 NOTE — Telephone Encounter (Signed)
Call in a Zpack  ?

## 2016-10-13 NOTE — Telephone Encounter (Signed)
I did send script e-scribe to [facility] and left a voice message for pt with this information.

## 2016-10-15 NOTE — Telephone Encounter (Signed)
Please resubmit the methimazole to [facility] with the new dosing as shown below.  Yes, let's increase Methimazole to 5 mg in am and 2.5 mg in pm.

## 2016-10-19 MED ORDER — METHIMAZOLE 5 MG PO TABS: ORAL_TABLET | ORAL | 2 refills | Status: DC

## 2016-10-19 NOTE — Telephone Encounter (Signed)
Refill submitted. 

## 2017-02-07 ENCOUNTER — Encounter: Payer: Self-pay | Admitting: Internal Medicine

## 2017-02-08 ENCOUNTER — Other Ambulatory Visit: Payer: Self-pay | Admitting: Internal Medicine

## 2017-02-08 DIAGNOSIS — E0580 Other thyrotoxicosis without thyrotoxic crisis or storm: Secondary | ICD-10-CM

## 2017-02-09 ENCOUNTER — Other Ambulatory Visit (INDEPENDENT_AMBULATORY_CARE_PROVIDER_SITE_OTHER): Payer: BLUE CROSS/BLUE SHIELD

## 2017-02-09 DIAGNOSIS — E0590 Thyrotoxicosis, unspecified without thyrotoxic crisis or storm: Secondary | ICD-10-CM

## 2017-02-09 DIAGNOSIS — E0580 Other thyrotoxicosis without thyrotoxic crisis or storm: Secondary | ICD-10-CM | POA: Diagnosis not present

## 2017-02-09 LAB — TSH: TSH: 1.42 u[IU]/mL (ref 0.35–4.50)

## 2017-02-09 LAB — T3, FREE: T3, Free: 3.5 pg/mL (ref 2.3–4.2)

## 2017-02-09 LAB — T4, FREE: Free T4: 0.62 ng/dL (ref 0.60–1.60)

## 2017-02-09 MED ORDER — METHIMAZOLE 5 MG PO TABS: ORAL_TABLET | ORAL | 2 refills | Status: DC

## 2017-02-13 ENCOUNTER — Other Ambulatory Visit: Payer: Self-pay | Admitting: Cardiovascular Disease

## 2017-02-16 DIAGNOSIS — S8992XA Unspecified injury of left lower leg, initial encounter: Secondary | ICD-10-CM | POA: Diagnosis not present

## 2017-02-18 DIAGNOSIS — M25562 Pain in left knee: Secondary | ICD-10-CM | POA: Diagnosis not present

## 2017-02-26 DIAGNOSIS — M25562 Pain in left knee: Secondary | ICD-10-CM | POA: Diagnosis not present

## 2017-03-01 DIAGNOSIS — M25562 Pain in left knee: Secondary | ICD-10-CM | POA: Diagnosis not present

## 2017-03-01 DIAGNOSIS — S83512A Sprain of anterior cruciate ligament of left knee, initial encounter: Secondary | ICD-10-CM | POA: Diagnosis not present

## 2017-03-23 DIAGNOSIS — M25562 Pain in left knee: Secondary | ICD-10-CM | POA: Diagnosis not present

## 2017-03-25 DIAGNOSIS — M25562 Pain in left knee: Secondary | ICD-10-CM | POA: Diagnosis not present

## 2017-03-28 DIAGNOSIS — M25562 Pain in left knee: Secondary | ICD-10-CM | POA: Diagnosis not present

## 2017-03-31 DIAGNOSIS — M25562 Pain in left knee: Secondary | ICD-10-CM | POA: Diagnosis not present

## 2017-04-01 ENCOUNTER — Ambulatory Visit (INDEPENDENT_AMBULATORY_CARE_PROVIDER_SITE_OTHER): Payer: BLUE CROSS/BLUE SHIELD | Admitting: Internal Medicine

## 2017-04-01 ENCOUNTER — Other Ambulatory Visit: Payer: Self-pay | Admitting: Internal Medicine

## 2017-04-01 ENCOUNTER — Encounter: Payer: Self-pay | Admitting: Internal Medicine

## 2017-04-01 VITALS — BP 122/70 | HR 68 | Wt 242.0 lb

## 2017-04-01 DIAGNOSIS — E042 Nontoxic multinodular goiter: Secondary | ICD-10-CM

## 2017-04-01 DIAGNOSIS — E0580 Other thyrotoxicosis without thyrotoxic crisis or storm: Secondary | ICD-10-CM | POA: Diagnosis not present

## 2017-04-01 DIAGNOSIS — E0590 Thyrotoxicosis, unspecified without thyrotoxic crisis or storm: Secondary | ICD-10-CM

## 2017-04-01 DIAGNOSIS — R7303 Prediabetes: Secondary | ICD-10-CM | POA: Diagnosis not present

## 2017-04-01 LAB — T4, FREE: FREE T4: 0.7 ng/dL (ref 0.60–1.60)

## 2017-04-01 LAB — TSH: TSH: 1.74 u[IU]/mL (ref 0.35–4.50)

## 2017-04-01 LAB — HEMOGLOBIN A1C: Hgb A1c MFr Bld: 6.3 % (ref 4.6–6.5)

## 2017-04-01 LAB — T3, FREE: T3, Free: 3.1 pg/mL (ref 2.3–4.2)

## 2017-04-01 MED ORDER — METHIMAZOLE 5 MG PO TABS: ORAL_TABLET | ORAL | 3 refills | Status: DC

## 2017-04-01 NOTE — Patient Instructions (Addendum)
Please stop at the lab.  Continue Methimazole 5 mg in am and 2.5 mg in pm.  Continue Metformin 500 mg 2x a day.  Please come back for a follow-up appointment in 6 months.

## 2017-04-01 NOTE — Progress Notes (Signed)
Patient ID: Alison Best, female   DOB: Nov 22, 1955, 61 y.o.   MRN: [id number]   HPI  Alison Best is a 61 y.o.-year-old female, returning for f/u for subclinical thyrotoxicosis and MNG and also prediabetes. Last OV was 1 year ago.  She started exercise (PT for her knee) >> feels much better.  Subclinical thyrotoxicosis: Reviewed hx: The thyroid tests were checked as she complained of neck pressure. She still feels this occasionally (upper neck).  I reviewed pt's thyroid tests: Lab Results  Component Value Date   TSH 1.42 02/09/2017   TSH 0.06 (L) 10/01/2016   TSH 0.88 05/12/2016   TSH 4.46 02/26/2016   TSH 0.60 12/29/2015   TSH 0.13 (L) 08/06/2015   TSH 0.04 (L) 07/07/2015   TSH 0.08 (L) 06/03/2015   TSH 0.016 (L) 02/25/2015   TSH 0.02 (L) 09/16/2014   FREET4 0.62 02/09/2017   FREET4 1.18 10/01/2016   FREET4 0.66 05/12/2016   FREET4 0.50 (L) 02/26/2016   FREET4 0.96 12/29/2015   FREET4 0.49 (L) 08/06/2015   FREET4 1.14 09/16/2014   FREET4 0.97 06/18/2014   FREET4 1.12 04/22/2014   Component     Latest Ref Rng 06/18/2014  TSI     <140 % baseline 111   07/10/2014 Uptake and scan: The 24 hr uptake by the thyroid gland is 28.5%. Normal 24 hr uptakeis 10-30 %. On thyroid imaging, the thyroid activity appears mildly heterogeneous in the right lobe with a possible cold nodule inferiorly. The left lobe activity appears homogeneous.  We started methimazole 5 mg daily in a.m >> doses were changed >> last dose change was in 09/2016  - 5 mg in am and 2.5 mg in pm. She sometimes forgets the am dose, in that case, she takes 5 mg at night.  Thyroid nodules: 07/15/2014 Thyroid U/S: Multiple bilateral thyroid nodules: poorly defined  Pt denies: - feeling nodules in neck - hoarseness - dysphagia - choking - SOB with lying down  Prediabetes:  Lab Results  Component Value Date   HGBA1C 6.2 10/01/2016   HGBA1C 6.0 02/26/2016   HGBA1C 4.8 07/30/2015   She was on   Metformin 500 mg 2x a day >> stopped in Fall 2015 >> restarted >> now continues Metformin 500 mg 1-2 x a day.  She would like to start carb counting to help her lose weight.  She had colon resection in summer 2016, after an episode of diverticulitis >> she is s/p colonostomy closure.  ROS: Constitutional: + weight gain, no fatigue, no subjective hyperthermia, no subjective hypothermia Eyes: no blurry vision, no xerophthalmia ENT: no sore throat, no nodules palpated in throat, no dysphagia, no odynophagia, no hoarseness Cardiovascular: no CP/no SOB/no palpitations/no leg swelling Respiratory: no cough/no SOB/no wheezing Gastrointestinal: no N/no V/no D/no C/no acid reflux Musculoskeletal: no muscle aches/+ joint aches (torn ACL) Skin: no rashes, no hair loss Neurological: no tremors/no numbness/no tingling/no dizziness  I reviewed pt's medications, allergies, PMH, social hx, family hx, and changes were documented in the history of present illness. Otherwise, unchanged from my initial visit note.  PE: BP 122/70 (BP Location: Left Arm, Patient Position: Sitting)   Pulse 68   Wt 242 lb (109.8 kg)   LMP 03/22/2012   SpO2 96%   BMI 46.48 kg/m   Wt Readings from Last 3 Encounters:  04/01/17 242 lb (109.8 kg)  09/29/16 239 lb (108.4 kg)  05/10/16 223 lb (101.2 kg)   Constitutional: overweight, in NAD Eyes: PERRLA, EOMI, no  exophthalmos ENT: moist mucous membranes, no thyromegaly, no cervical lymphadenopathy Cardiovascular: RRR, No MRG Respiratory: CTA B Gastrointestinal: abdomen soft, NT, ND, BS+ Musculoskeletal: no deformities, strength intact in all 4 Skin: moist, warm, no rashes Neurological: no tremor with outstretched hands, DTR normal in all 4  ASSESSMENT: 1. Thyrotoxycosis  07/10/2014: Uptake and scan: 24 hr uptake by the thyroid gland is 28.5%. Normal 24 hr uptake is 10-30 %. On thyroid imaging, the thyroid activity appears mildly heterogeneous in the right lobe with a  possible cold nodule inferiorly. The left lobe activity appears homogeneous.  2. Multinodular goiter  07/15/2014 thyroid ultrasound:  Right thyroid lobe: 4.5 x 1.9 x 1.9 cm. Multiple nodules are noted throughout the right lobe of the thyroid. There is diffuse irregular margined hypoechoic nodule measuring 16 mm in the upper pole. Multiple smaller nodules are identified.  Left thyroid lobe: 4.4 x 1.7 x 1.9 cm. Multiple hypoechoic nodules are noted within the left lobe of the thyroid. The largest of these measures 15 mm in greatest dimension lying within the lower pole  Isthmus Thickness: 0.5 cm. No nodules visualized.  Lymphadenopathy None visualized.  IMPRESSION: Multiple bilateral thyroid nodules. The dominant nodules bilateral meet criteria for percutaneous biopsy.   3. Prediabetes   PLAN:  1. Patient with a history of subclinical hyperthyroidism, initially with few possible thyrotoxic sxs: Heat intolerance, palpitations, now resolved. In the past, I recommended to start methimazole, but she delayed this and ended up developing atrial fibrillation. She finally started methimazole 5 mg twice a day in 05/2015 and since her TSH was still low afterwards, we increased the dose to 10 mg in a.m. and 5 mg in p.m. She had several dose changes, and increasing dose in 09/2016 to 5 mg in a.m. and 2.5 mg in p.m. TFTs were great on this dose. She missed some doses since last visit, but not many. - Her thyroid uptake and scan was negative for thyroiditis but she had some nodules in the right lobe which could have been slightly overactive  - She continues on metoprolol XL 50 mg daily (started after her Afib dx), but c/o dizziness and plans to d/w cardiology if dose can be decreased - We will check TSH, free T4 and free T3 today.  2. Multinodular goiter - Reviewed the reports of the previous thyroid ultrasound that showed several nodules, with the largest one being eligible for FNA. However, she was hyper  thyroid at the time of her ultrasound that it is possible that the nodules were inflammatory (pseudo-nodules). We will repeat a thyroid ultrasound one year after she becomes euthyroid >> will order at next visit  3. Prediabetes - Will reviewed together her most recent HbA1c: Lab Results  Component Value Date   HGBA1C 6.2 10/01/2016  - She continues on metformin 500 mg 1-2 a day - We'll check another HbA1c today - advised her to start checking sugars once a day at home    Needs 90 day supplies for MMI.  Component     Latest Ref Rng & Units 04/01/2017  TSH     0.35 - 4.50 uIU/mL 1.74  Triiodothyronine,Free,Serum     2.3 - 4.2 pg/mL 3.1  T4,Free(Direct)     0.60 - 1.60 ng/dL 0.70  Hemoglobin A1C     4.6 - 6.5 % 6.3   Thyroid tests are normal. I would suggest to continue with the current dose of methimazole for now. I will ask her to come back for a repeat set  of labs in 3 months. HbA1c is creeping up. I will advise her to continue with twice, rather than once, a day metformin. I will suggest again to start checking sugars at home.  Philemon Kingdom, MD PhD [facility]

## 2017-04-04 ENCOUNTER — Other Ambulatory Visit: Payer: Self-pay

## 2017-04-04 ENCOUNTER — Other Ambulatory Visit: Payer: Self-pay | Admitting: Cardiovascular Disease

## 2017-04-04 ENCOUNTER — Encounter: Payer: Self-pay | Admitting: Internal Medicine

## 2017-04-04 DIAGNOSIS — E0590 Thyrotoxicosis, unspecified without thyrotoxic crisis or storm: Secondary | ICD-10-CM

## 2017-04-04 MED ORDER — METHIMAZOLE 5 MG PO TABS: ORAL_TABLET | ORAL | 3 refills | Status: DC

## 2017-04-06 DIAGNOSIS — M25562 Pain in left knee: Secondary | ICD-10-CM | POA: Diagnosis not present

## 2017-04-08 DIAGNOSIS — M25562 Pain in left knee: Secondary | ICD-10-CM | POA: Diagnosis not present

## 2017-04-11 DIAGNOSIS — M25562 Pain in left knee: Secondary | ICD-10-CM | POA: Diagnosis not present

## 2017-04-21 DIAGNOSIS — M25562 Pain in left knee: Secondary | ICD-10-CM | POA: Diagnosis not present

## 2017-04-25 ENCOUNTER — Other Ambulatory Visit: Payer: Self-pay | Admitting: Family Medicine

## 2017-04-25 DIAGNOSIS — Z1231 Encounter for screening mammogram for malignant neoplasm of breast: Secondary | ICD-10-CM

## 2017-04-26 ENCOUNTER — Encounter: Payer: Self-pay | Admitting: Family Medicine

## 2017-04-26 ENCOUNTER — Ambulatory Visit (INDEPENDENT_AMBULATORY_CARE_PROVIDER_SITE_OTHER): Payer: BLUE CROSS/BLUE SHIELD | Admitting: Family Medicine

## 2017-04-26 VITALS — BP 106/64 | Temp 98.8°F | Ht 60.5 in | Wt 244.0 lb

## 2017-04-26 DIAGNOSIS — N840 Polyp of corpus uteri: Secondary | ICD-10-CM

## 2017-04-26 DIAGNOSIS — Z8742 Personal history of other diseases of the female genital tract: Secondary | ICD-10-CM | POA: Diagnosis not present

## 2017-04-26 DIAGNOSIS — R1030 Lower abdominal pain, unspecified: Secondary | ICD-10-CM

## 2017-04-26 DIAGNOSIS — M25562 Pain in left knee: Secondary | ICD-10-CM | POA: Diagnosis not present

## 2017-04-26 DIAGNOSIS — M940 Chondrocostal junction syndrome [Tietze]: Secondary | ICD-10-CM

## 2017-04-26 DIAGNOSIS — N644 Mastodynia: Secondary | ICD-10-CM | POA: Diagnosis not present

## 2017-04-26 MED ORDER — DICLOFENAC SODIUM 75 MG PO TBEC: 75.0000 mg | DELAYED_RELEASE_TABLET | Freq: Two times a day (BID) | ORAL | 2 refills | Status: DC

## 2017-04-26 MED ORDER — METRONIDAZOLE 1 % EX GEL: Freq: Every day | CUTANEOUS | 11 refills | Status: DC

## 2017-04-26 MED ORDER — EPINEPHRINE 0.3 MG/0.3ML IJ SOAJ: 0.3000 mg | Freq: Once | INTRAMUSCULAR | 5 refills | Status: AC

## 2017-04-26 MED ORDER — FUROSEMIDE 20 MG PO TABS: ORAL_TABLET | ORAL | 11 refills | Status: DC

## 2017-04-26 NOTE — Patient Instructions (Signed)
WE NOW OFFER   [facility] FAST TRACK!!!  SAME DAY Appointments for ACUTE CARE  Such as: Sprains, Injuries, cuts, abrasions, rashes, muscle pain, joint pain, back pain Colds, flu, sore throats, headache, allergies, cough, fever  Ear pain, sinus and eye infections Abdominal pain, nausea, vomiting, diarrhea, upset stomach Animal/insect bites  3 Easy Ways to Schedule: Walk-In Scheduling Call in scheduling Mychart Sign-up: [email or web address]         

## 2017-04-27 ENCOUNTER — Encounter: Payer: Self-pay | Admitting: Family Medicine

## 2017-04-27 NOTE — Progress Notes (Signed)
   Subjective:    Patient ID: Alison Best, female    DOB: 01/16/1956, 61 y.o.   MRN: [id number]  HPI Here for several issues. First she has had right chest and breast soreness for 2 months. No lumps or nipple DC. Her last mammogram was normal one year ago. Second she asks for a referral to GI to check her abdomen. She is S/P a partial colectomy from a perforated diverticulum, and over the past 3 months she has had swelling of the left side of the lower abdomen. She has mild pain at times, no nausea or fever. Her BMs are normal. Third she asks for a referral to GYN for a pelvic exam and Pap smear. She has a hx of uterine polyps. She had been seeing Dr. Carren Rang who has retired.    Review of Systems  Constitutional: Negative.   Respiratory: Negative.   Cardiovascular: Positive for chest pain. Negative for palpitations and leg swelling.  Gastrointestinal: Positive for abdominal distention and abdominal pain. Negative for anal bleeding, blood in stool, constipation, diarrhea, nausea, rectal pain and vomiting.  Genitourinary: Negative.        Objective:   Physical Exam  Constitutional: She appears well-developed and well-nourished.  Cardiovascular: Normal rate, regular rhythm, normal heart sounds and intact distal pulses.   Pulmonary/Chest: Effort normal and breath sounds normal. No respiratory distress. She has no wheezes. She has no rales.  She is tender along the right costosternal margin   Abdominal: Soft. Bowel sounds are normal. There is no tenderness. There is no rebound and no guarding.  The lower left half of her abdomen is tense and larger than the right side. No tenderness.   Genitourinary:  Genitourinary Comments: Both breasts are normal on exam. No nipple DC, no tenderness and no lumps. Axillae are clear.           Assessment & Plan:  The soreness she has been feeling is due to costochondritis and not a breat problem. She will try Diclofenac 75 mg bid to clear this up. We  will set her up for a diagnostic mammogram and [name] soon just to be sure. She may have an incisional hernia in the abdomen but at her request we will set her up to see GI. Refer to GYN to follow up on uterine polyps.  Alysia Penna, MD

## 2017-05-03 ENCOUNTER — Encounter: Payer: Self-pay | Admitting: Family Medicine

## 2017-05-03 ENCOUNTER — Encounter: Payer: Self-pay | Admitting: Gastroenterology

## 2017-05-03 ENCOUNTER — Telehealth: Payer: Self-pay | Admitting: Gastroenterology

## 2017-05-03 NOTE — Telephone Encounter (Signed)
I would keep the appt with Dr. Silverio Decamp but in the meantime also see Dr. Zella Richer ASAP

## 2017-05-03 NOTE — Telephone Encounter (Signed)
Dr.Nandigam reviewed records and accepted for patient to be scheduled for an office visit next available. Patient has been scheduled for next available office visit with Dr.Nandigam.

## 2017-05-03 NOTE — Telephone Encounter (Signed)
Received referral for patient. Patient saw Dr.Stark in 2011 but transferred to Dr.Ganem the same year due to our office not being able to work her in when she wanted an appt. Patient states she would like to come back due to never wanting to transfer, but was recommended to Dr.Nandigam by Davonna Belling and prefers a female. Records from [place] placed on Dr.Nandigam's desk for review.

## 2017-05-04 DIAGNOSIS — M25562 Pain in left knee: Secondary | ICD-10-CM | POA: Diagnosis not present

## 2017-05-05 NOTE — Progress Notes (Signed)
Chief Complaint  Patient presents with  . Palpitations    History of Present Illness: 61 yo female with history of paroxysmal atrial fibrillation, hyperlipidemia, sleep apnea, hyperthyroidism, carotid artery disease and former tobacco abuse who is here today for cardiac follow up. I saw her as a new patient July 2016 for evaluation of abnormal EKG. She was hospitalized at [facility] May 2016 with perforated sigmoid diverticulitis with intra-abdominal abscesses and underwent emergency exploratory laparotomy and drainage of intra-abdominal abscesses, salpingo oophorectomy, descending colostomy, sigmoid colectomy, cystoscopy. During her hospitalization her EKG showed T wave inversions and ST depression. I saw her in the office in July 2016 and arranged an echo and a holter monitor. Echo with normal LV function and TR with no other valve disease. Stress myoview 05/28/15 with no ischemia. When she presented for her echo she was in rapid atrial fib. She was started on Toprol and ASA given CHADS VASC score of 1.Carotid dopplers August 2016 with mild bilateral carotid disease.    She is here today for follow up. The patient denies any chest pain, dyspnea, palpitations, lower extremity edema, orthopnea, PND, dizziness, near syncope or syncope. She reports weight gain and fatigue on beta blocker. Wants to change to something else.    Primary Care Physician: Laurey Morale, MD  Past Medical History:  Diagnosis Date  . Arthritis   . Bursitis   . Diverticulitis of colon 02/2015  . Ectopic pregnancy   . Heart murmur   . Hyperlipidemia   . Hyperthyroidism   . Irregular heartbeat   . Obesity   . Sleep apnea   . Thyroid disease    hyperthyroid    Past Surgical History:  Procedure Laterality Date  . APPENDECTOMY    . CESAREAN SECTION    . COLONOSCOPY  09-10-10   per Dr. Acquanetta Sit, diverticulosis of descending colon and sigmoid, internal hemorrhoids, repeat in 10 yrs  . COLOSTOMY N/A 02/20/2015   Procedure: COLOSTOMY;  Surgeon: Jackolyn Confer, MD;  Location: [facility];  Service: General;  Laterality: N/A;  . COLOSTOMY REVISION  02/20/2015   Procedure: Sigmoid Colectomy;  Surgeon: Jackolyn Confer, MD;  Location: [facility];  Service: General;;  . CYSTOSCOPY N/A 02/20/2015   Procedure: Consuela Mimes;  Surgeon: Kathie Rhodes, MD;  Location: [facility];  Service: Urology;  Laterality: N/A;  . ILEO LOOP COLOSTOMY CLOSURE N/A 09/26/2015   Procedure: LAPAROSCOPIC LYSIS OF ADHESIONS, COLOSTOMY CLOSURE;  Surgeon: Jackolyn Confer, MD;  Location: [facility];  Service: General;  Laterality: N/A;  . LAPAROTOMY N/A 02/20/2015   Procedure: Emergency EXPLORATORY and drainiage of interabdominal abcesses;  Surgeon: Jackolyn Confer, MD;  Location: [facility];  Service: General;  Laterality: N/A;  . SALPINGOOPHORECTOMY Left 02/20/2015   Procedure: SALPINGO OOPHORECTOMY;  Surgeon: Jackolyn Confer, MD;  Location: [facility];  Service: General;  Laterality: Left;  . TONSILLECTOMY  1980  . TONSILLECTOMY      Current Outpatient Prescriptions  Medication Sig Dispense Refill  . acetaminophen (TYLENOL) 325 MG tablet Take 2 tablets (650 mg total) by mouth every 6 (six) hours as needed for moderate pain or headache.    . albuterol (PROAIR HFA) 108 (90 Base) MCG/ACT inhaler Inhale 2 puffs into the lungs every 4 (four) hours as needed for wheezing. 1 Inhaler 5  . aspirin 81 MG tablet Take 81 mg by mouth daily.    . furosemide (LASIX) 20 MG tablet TAKE 1 TABLET BY MOUTH DAILY AS NEEDED (FLUID) 30 tablet 11  . LevOCARNitine (CARNITINE  PO) Take by mouth daily.    . metFORMIN (GLUCOPHAGE) 500 MG tablet Take 1 tablet (500 mg total) by mouth 2 (two) times daily with a meal. 180 tablet 3  . methimazole (TAPAZOLE) 5 MG tablet Take 1 tablet in a.m.  And 1/2 tab is the p.m 135 tablet 3  . metroNIDAZOLE (METROGEL) 1 % gel Apply topically daily. 60 g 11  . Multiple Vitamins-Minerals (MULTIVITAMIN WITH MINERALS) tablet Take 1 tablet by mouth daily.    .  multivitamin-lutein (OCUVITE-LUTEIN) CAPS capsule Take 1 capsule by mouth daily.    Marland Kitchen diltiazem (CARDIZEM CD) 120 MG 24 hr capsule Take 1 capsule (120 mg total) by mouth daily. 30 capsule 11   No current facility-administered medications for this visit.     Allergies  Allergen Reactions  . Bupropion Nausea And Vomiting  . Levaquin [Levofloxacin] Other (See Comments)    Severe tendonitis Tolerates Cipro    Social History   Social History  . Marital status: Married    Spouse name: N/A  . Number of children: 1  . Years of education: N/A   Occupational History  . 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 Smoking status: Current Every Day Smoker    Packs/day: 0.25    Years: 40.00    Types: Cigarettes    Last attempt to quit: 02/07/2015  . Smokeless tobacco: Never Used     Comment: less than 1/2 pack a day  . Alcohol use 0.0 oz/week     Comment: occ  . Drug use: No  . Sexual activity: Yes   Other Topics Concern  . Not on file   Social History Narrative  . No narrative on file    Family History  Problem Relation Age of Onset  . Cancer Mother   . CAD Father        CABG  . Skin cancer Father   . COPD Other   . Allergies Brother   . Cancer Maternal Grandmother   . Heart attack Maternal Grandfather   . Heart attack Paternal Grandfather   . Stroke Neg Hx   . Hypertension Neg Hx     Review of Systems:  As stated in the HPI and otherwise negative.   BP 112/70   Pulse 61   Ht 5' 0.5" (1.537 m)   Wt 244 lb (110.7 kg)   LMP 03/22/2012   SpO2 96%   BMI 46.87 kg/m   Physical Examination:  General: Well developed, well nourished, NAD  HEENT: OP clear, mucus membranes moist  SKIN: warm, dry. No rashes. Neuro: No focal deficits  Musculoskeletal: Muscle strength 5/5 all ext  Psychiatric: Mood and affect normal  Neck: No JVD, no carotid bruits, no thyromegaly, no lymphadenopathy.  Lungs:Clear bilaterally,  no wheezes, rhonci, crackles Cardiovascular: Regular rate and rhythm. No murmurs, gallops or rubs. Abdomen:Soft. Bowel sounds present. Non-tender.  Extremities: No lower extremity edema. Pulses are 2 + in the bilateral DP/PT.  Echo 06/03/15: Left ventricle: The cavity size was normal. Wall thickness was normal. Systolic function was normal. The estimated ejection fraction was in the range of 60% to 65%. Wall motion was normal; there were no regional wall motion abnormalities. - Left atrium: The atrium was moderately dilated. - Tricuspid valve: There was mild-moderate regurgitation. - Pulmonary arteries: Systolic pressure was moderately increased. PA peak pressure: 51 mm Hg (S).  EKG:  EKG is  ordered today. The ekg ordered today demonstrates NSR,  rate 65 bpm  Recent Labs: 04/01/2017: TSH 1.74    Wt Readings from Last 3 Encounters:  05/06/17 244 lb (110.7 kg)  04/26/17 244 lb (110.7 kg)  04/01/17 242 lb (109.8 kg)     Other studies Reviewed: Additional studies/ records that were reviewed today include: . Review of the above records demonstrates:    Assessment and Plan:   1. Paroxysmal atrial fibrillation: This occurred in the setting of hyperthyroidism. She has had no recent palpitations. CHADSVASC score 1. Will change Toprol to Cardizem CD 120 mg daily. Continue ASA.    2. Carotid disease: Mild bilateral disease by dopplers 2016.   3. Tobacco abuse: Smoking cessation advised.   Current medicines are reviewed at length with the patient today.  The patient does not have concerns regarding medicines.  The following changes have been made:  no change  Labs/ tests ordered today include:   Orders Placed This Encounter  Procedures  . EKG 12-Lead    Disposition:   FU with me in 12 months  Signed, Lauree Chandler, MD 05/06/2017 11:07 AM    [facility] [address], [place], [place]  [address] Phone: [phone]; Fax: [phone]

## 2017-05-06 ENCOUNTER — Encounter: Payer: Self-pay | Admitting: Cardiovascular Disease

## 2017-05-06 ENCOUNTER — Ambulatory Visit (INDEPENDENT_AMBULATORY_CARE_PROVIDER_SITE_OTHER): Payer: BLUE CROSS/BLUE SHIELD | Admitting: Cardiovascular Disease

## 2017-05-06 ENCOUNTER — Ambulatory Visit
Admission: RE | Admit: 2017-05-06 | Discharge: 2017-05-06 | Disposition: A | Discharge status: Home / Self Care | Payer: BLUE CROSS/BLUE SHIELD | Source: Ambulatory Visit | Attending: Family Medicine | Admitting: Family Medicine

## 2017-05-06 ENCOUNTER — Ambulatory Visit: Payer: BLUE CROSS/BLUE SHIELD

## 2017-05-06 VITALS — BP 112/70 | HR 61 | Ht 60.5 in | Wt 244.0 lb

## 2017-05-06 DIAGNOSIS — I480 Paroxysmal atrial fibrillation: Secondary | ICD-10-CM

## 2017-05-06 DIAGNOSIS — R0989 Other specified symptoms and signs involving the circulatory and respiratory systems: Secondary | ICD-10-CM | POA: Diagnosis not present

## 2017-05-06 DIAGNOSIS — R928 Other abnormal and inconclusive findings on diagnostic imaging of breast: Secondary | ICD-10-CM | POA: Diagnosis not present

## 2017-05-06 DIAGNOSIS — N644 Mastodynia: Secondary | ICD-10-CM

## 2017-05-06 MED ORDER — DILTIAZEM HCL ER COATED BEADS 120 MG PO CP24: 120.0000 mg | ORAL_CAPSULE | Freq: Every day | ORAL | 11 refills | Status: DC

## 2017-05-06 NOTE — Patient Instructions (Signed)
Medication Instructions:  Your physician has recommended you make the following change in your medication:  Stop Toprol. Start Cardizem CD 120 mg by mouth daily   Labwork: none  Testing/Procedures: none  Follow-Up: Your physician recommends that you schedule a follow-up appointment in: 12 months. Please call our office in about 9 months to schedule this appointment    Any Other Special Instructions Will Be Listed Below (If Applicable).     If you need a refill on your cardiac medications before your next appointment, please call your pharmacy.

## 2017-05-10 DIAGNOSIS — K432 Incisional hernia without obstruction or gangrene: Secondary | ICD-10-CM | POA: Diagnosis not present

## 2017-05-23 ENCOUNTER — Encounter: Payer: Self-pay | Admitting: Obstetrics & Gynecology

## 2017-05-23 ENCOUNTER — Ambulatory Visit (INDEPENDENT_AMBULATORY_CARE_PROVIDER_SITE_OTHER): Payer: BLUE CROSS/BLUE SHIELD | Admitting: Obstetrics & Gynecology

## 2017-05-23 VITALS — BP 134/86 | Ht 62.0 in | Wt 245.0 lb

## 2017-05-23 DIAGNOSIS — IMO0001 Reserved for inherently not codable concepts without codable children: Secondary | ICD-10-CM

## 2017-05-23 DIAGNOSIS — Z780 Asymptomatic menopausal state: Secondary | ICD-10-CM

## 2017-05-23 DIAGNOSIS — Z01411 Encounter for gynecological examination (general) (routine) with abnormal findings: Secondary | ICD-10-CM | POA: Diagnosis not present

## 2017-05-23 DIAGNOSIS — Z1151 Encounter for screening for human papillomavirus (HPV): Secondary | ICD-10-CM

## 2017-05-23 DIAGNOSIS — Z720 Tobacco use: Secondary | ICD-10-CM | POA: Diagnosis not present

## 2017-05-23 DIAGNOSIS — E6609 Other obesity due to excess calories: Secondary | ICD-10-CM | POA: Diagnosis not present

## 2017-05-23 DIAGNOSIS — Z6841 Body Mass Index (BMI) 40.0 and over, adult: Secondary | ICD-10-CM

## 2017-05-23 NOTE — Progress Notes (Signed)
Alison Best Dec 17, 1955 [id number]   History:    61 y.o.  G2P1A1 Married.  Son 60 yo.  S/P Lt SO.  Works in [employer].  RP:  New patient presenting for annual gyn exam   HPI:  Menopause.  No HRT.  No PMB.  No pelvic pain.  Had a bowel perforation secondary to Diverticulitis with Bowel resection, had colostomy.  Following many surgeries, developed a large Left Abdominal Hernia. Needs to loose weight before repair. Obesity with BMI at 44.81.  Osteoarthritis. Cigarette smoker <1/2 pack per day.  Many medical issues including DM and Hyperthyroidism.  Past medical history,surgical history, family history and social history were all reviewed and documented in the EPIC chart.  Gynecologic History Patient's last menstrual period was 03/22/2012. Contraception: post menopausal status Last Pap: 2 yrs ago. Results were: normal Last mammogram: 2018. Results were: normal  Obstetric History OB History  No data available     ROS: A ROS was performed and pertinent positives and negatives are included in the history.  GENERAL: No fevers or chills. HEENT: No change in vision, no earache, sore throat or sinus congestion. NECK: No pain or stiffness. CARDIOVASCULAR: No chest pain or pressure. No palpitations. PULMONARY: No shortness of breath, cough or wheeze. GASTROINTESTINAL: No abdominal pain, nausea, vomiting or diarrhea, melena or bright red blood per rectum. GENITOURINARY: No urinary frequency, urgency, hesitancy or dysuria. MUSCULOSKELETAL: No joint or muscle pain, no back pain, no recent trauma. DERMATOLOGIC: No rash, no itching, no lesions. ENDOCRINE: No polyuria, polydipsia, no heat or cold intolerance. No recent change in weight. HEMATOLOGICAL: No anemia or easy bruising or bleeding. NEUROLOGIC: No headache, seizures, numbness, tingling or weakness. PSYCHIATRIC: No depression, no loss of interest in normal activity or change in sleep pattern.     Exam:   LMP 03/22/2012    There is no height or weight on file to calculate BMI.  General appearance : Well developed, obese female. No acute distress HEENT: Eyes: no retinal hemorrhage or exudates,  Neck supple, trachea midline, no carotid bruits, no thyroidmegaly Lungs: Clear to auscultation, no rhonchi or wheezes, or rib retractions  Heart: Regular rate and rhythm, no murmurs or gallops Breast:Examined in sitting and supine position were symmetrical in appearance, no palpable masses or tenderness,  no skin retraction, no nipple inversion, no nipple discharge, no skin discoloration, no axillary or supraclavicular lymphadenopathy Abdomen: no palpable masses or tenderness, no rebound or guarding.  Large left abdominal hernia.  No evidence of incarceration. Extremities: no edema or skin discoloration or tenderness  Pelvic: Vulva normal  Bartholin, Urethra, Skene Glands: Within normal limits             Vagina: No gross lesions or discharge  Cervix: No gross lesions or discharge.  Pap/HPV HR done.  Uterus  AV, normal size, shape and consistency, non-tender and mobile  Adnexa  Without masses or tenderness  Anus and perineum  normal    Assessment/Plan:  61 y.o. female for annual exam   1. Encounter for gynecological examination with abnormal finding Gyn exam with Atrophic Vaginitis of Menopause.  Pap/HPV HR done.  Breasts wnl.  2. Menopause present No HRT.  No PMB.  Vit D supplement.  Ca++ in nutrition.  Wt bearing physical activity.  Recommended Dexa.  3. Class 3 obesity due to excess calories with serious comorbidity and body mass index (BMI) of 40.0 to 44.9 in adult ([place]) Low Calorie/low Carb diet like [employer] recommended.  Refer  to Nutrition Center.  Increase physical Activity.  4. Tobacco abuse Recommended progressive decrease in number of cigarettes smoked every day until ready to quit completely.  Counseling on above issues >50% x 20 minutes.  Princess Bruins MD, 4:17 PM 05/23/2017

## 2017-05-23 NOTE — Patient Instructions (Signed)
1. Encounter for gynecological examination with abnormal finding Gyn exam with Atrophic Vaginitis of Menopause.  Pap/HPV HR done.  Breasts wnl.  2. Menopause present No HRT.  No PMB.  Vit D supplement.  Ca++ in nutrition.  Wt bearing physical activity.  Recommended Dexa.  3. Class 3 obesity due to excess calories with serious comorbidity and body mass index (BMI) of 40.0 to 44.9 in adult ([place]) Low Calorie/low Carb diet like [employer] recommended.  Refer to [place].  Increase physical Activity.  4. Tobacco abuse Recommended progressive decrease in number of cigarettes smoked every day until ready to quit completely.  Alison Best, it was a pleasure to meet you today!  I will inform you of your results as soon as available.  You will get a phone call about the [place] referral.   Health Maintenance for Postmenopausal Women Menopause is a normal process in which your reproductive ability comes to an end. This process happens gradually over a span of months to years, usually between the ages of 26 and 78. Menopause is complete when you have missed 12 consecutive menstrual periods. It is important to talk with your health care provider about some of the most common conditions that affect postmenopausal women, such as heart disease, cancer, and bone loss (osteoporosis). Adopting a healthy lifestyle and getting preventive care can help to promote your health and wellness. Those actions can also lower your chances of developing some of these common conditions. What should I know about menopause? During menopause, you may experience a number of symptoms, such as:  Moderate-to-severe hot flashes.  Night sweats.  Decrease in sex drive.  Mood swings.  Headaches.  Tiredness.  Irritability.  Memory problems.  Insomnia.  Choosing to treat or not to treat menopausal changes is an individual decision that you make with your health care provider. What should I know about  hormone replacement therapy and supplements? Hormone therapy products are effective for treating symptoms that are associated with menopause, such as hot flashes and night sweats. Hormone replacement carries certain risks, especially as you become older. If you are thinking about using estrogen or estrogen with progestin treatments, discuss the benefits and risks with your health care provider. What should I know about heart disease and stroke? Heart disease, heart attack, and stroke become more likely as you age. This may be due, in part, to the hormonal changes that your body experiences during menopause. These can affect how your body processes dietary fats, triglycerides, and cholesterol. Heart attack and stroke are both medical emergencies. There are many things that you can do to help prevent heart disease and stroke:  Have your blood pressure checked at least every 1-2 years. High blood pressure causes heart disease and increases the risk of stroke.  If you are 73-62 years old, ask your health care provider if you should take aspirin to prevent a heart attack or a stroke.  Do not use any tobacco products, including cigarettes, chewing tobacco, or electronic cigarettes. If you need help quitting, ask your health care provider.  It is important to eat a healthy diet and maintain a healthy weight. ? Be sure to include plenty of vegetables, fruits, low-fat dairy products, and lean protein. ? Avoid eating foods that are high in solid fats, added sugars, or salt (sodium).  Get regular exercise. This is one of the most important things that you can do for your health. ? Try to exercise for at least 150 minutes each week. The  type of exercise that you do should increase your heart rate and make you sweat. This is known as moderate-intensity exercise. ? Try to do strengthening exercises at least twice each week. Do these in addition to the moderate-intensity exercise.  Know your numbers.Ask your  health care provider to check your cholesterol and your blood glucose. Continue to have your blood tested as directed by your health care provider.  What should I know about cancer screening? There are several types of cancer. Take the following steps to reduce your risk and to catch any cancer development as early as possible. Breast Cancer  Practice breast self-awareness. ? This means understanding how your breasts normally appear and feel. ? It also means doing regular breast self-exams. Let your health care provider know about any changes, no matter how small.  If you are 46 or older, have a clinician do a breast exam (clinical breast exam or CBE) every year. Depending on your age, family history, and medical history, it may be recommended that you also have a yearly breast X-ray (mammogram).  If you have a family history of breast cancer, talk with your health care provider about genetic screening.  If you are at high risk for breast cancer, talk with your health care provider about having an MRI and a mammogram every year.  Breast cancer (BRCA) gene test is recommended for women who have family members with BRCA-related cancers. Results of the assessment will determine the need for genetic counseling and BRCA1 and for BRCA2 testing. BRCA-related cancers include these types: ? Breast. This occurs in males or females. ? Ovarian. ? Tubal. This may also be called fallopian tube cancer. ? Cancer of the abdominal or pelvic lining (peritoneal cancer). ? Prostate. ? Pancreatic.  Cervical, Uterine, and Ovarian Cancer Your health care provider may recommend that you be screened regularly for cancer of the pelvic organs. These include your ovaries, uterus, and vagina. This screening involves a pelvic exam, which includes checking for microscopic changes to the surface of your cervix (Pap test).  For women ages 21-65, health care providers may recommend a pelvic exam and a Pap test every three  years. For women ages 67-65, they may recommend the Pap test and pelvic exam, combined with testing for human papilloma virus (HPV), every five years. Some types of HPV increase your risk of cervical cancer. Testing for HPV may also be done on women of any age who have unclear Pap test results.  Other health care providers may not recommend any screening for nonpregnant women who are considered low risk for pelvic cancer and have no symptoms. Ask your health care provider if a screening pelvic exam is right for you.  If you have had past treatment for cervical cancer or a condition that could lead to cancer, you need Pap tests and screening for cancer for at least 20 years after your treatment. If Pap tests have been discontinued for you, your risk factors (such as having a new sexual partner) need to be reassessed to determine if you should start having screenings again. Some women have medical problems that increase the chance of getting cervical cancer. In these cases, your health care provider may recommend that you have screening and Pap tests more often.  If you have a family history of uterine cancer or ovarian cancer, talk with your health care provider about genetic screening.  If you have vaginal bleeding after reaching menopause, tell your health care provider.  There are currently no reliable  tests available to screen for ovarian cancer.  Lung Cancer Lung cancer screening is recommended for adults 35-34 years old who are at high risk for lung cancer because of a history of smoking. A yearly low-dose CT scan of the lungs is recommended if you:  Currently smoke.  Have a history of at least 30 pack-years of smoking and you currently smoke or have quit within the past 15 years. A pack-year is smoking an average of one pack of cigarettes per day for one year.  Yearly screening should:  Continue until it has been 15 years since you quit.  Stop if you develop a health problem that would  prevent you from having lung cancer treatment.  Colorectal Cancer  This type of cancer can be detected and can often be prevented.  Routine colorectal cancer screening usually begins at age 63 and continues through age 50.  If you have risk factors for colon cancer, your health care provider may recommend that you be screened at an earlier age.  If you have a family history of colorectal cancer, talk with your health care provider about genetic screening.  Your health care provider may also recommend using home test kits to check for hidden blood in your stool.  A small camera at the end of a tube can be used to examine your colon directly (sigmoidoscopy or colonoscopy). This is done to check for the earliest forms of colorectal cancer.  Direct examination of the colon should be repeated every 5-10 years until age 66. However, if early forms of precancerous polyps or small growths are found or if you have a family history or genetic risk for colorectal cancer, you may need to be screened more often.  Skin Cancer  Check your skin from head to toe regularly.  Monitor any moles. Be sure to tell your health care provider: ? About any new moles or changes in moles, especially if there is a change in a mole's shape or color. ? If you have a mole that is larger than the size of a pencil eraser.  If any of your family members has a history of skin cancer, especially at a young age, talk with your health care provider about genetic screening.  Always use sunscreen. Apply sunscreen liberally and repeatedly throughout the day.  Whenever you are outside, protect yourself by wearing long sleeves, pants, a wide-brimmed hat, and sunglasses.  What should I know about osteoporosis? Osteoporosis is a condition in which bone destruction happens more quickly than new bone creation. After menopause, you may be at an increased risk for osteoporosis. To help prevent osteoporosis or the bone fractures that  can happen because of osteoporosis, the following is recommended:  If you are 36-55 years old, get at least 1,000 mg of calcium and at least 600 mg of vitamin D per day.  If you are older than age 28 but younger than age 75, get at least 1,200 mg of calcium and at least 600 mg of vitamin D per day.  If you are older than age 17, get at least 1,200 mg of calcium and at least 800 mg of vitamin D per day.  Smoking and excessive alcohol intake increase the risk of osteoporosis. Eat foods that are rich in calcium and vitamin D, and do weight-bearing exercises several times each week as directed by your health care provider. What should I know about how menopause affects my mental health? Depression may occur at any age, but it is more  common as you become older. Common symptoms of depression include:  Low or sad mood.  Changes in sleep patterns.  Changes in appetite or eating patterns.  Feeling an overall lack of motivation or enjoyment of activities that you previously enjoyed.  Frequent crying spells.  Talk with your health care provider if you think that you are experiencing depression. What should I know about immunizations? It is important that you get and maintain your immunizations. These include:  Tetanus, diphtheria, and pertussis (Tdap) booster vaccine.  Influenza every year before the flu season begins.  Pneumonia vaccine.  Shingles vaccine.  Your health care provider may also recommend other immunizations. This information is not intended to replace advice given to you by your health care provider. Make sure you discuss any questions you have with your health care provider. Document Released: 12/03/2005 Document Revised: 04/30/2016 Document Reviewed: 07/15/2015 Elsevier Interactive Patient Education  2018 [employer].

## 2017-05-24 ENCOUNTER — Telehealth: Payer: Self-pay | Admitting: *Deleted

## 2017-05-24 NOTE — Telephone Encounter (Signed)
Referral placed they will contact pt to schedule. 

## 2017-05-24 NOTE — Telephone Encounter (Signed)
-----   Message from Princess Bruins, MD sent at 05/23/2017  4:57 PM EDT ----- Regarding: Refer to [facility].  Abdominal hernia with need to loose weight prior to surgery.

## 2017-05-24 NOTE — Addendum Note (Signed)
Addended by: Thurnell Garbe A on: 05/24/2017 04:56 PM   Modules accepted: Orders

## 2017-05-25 DIAGNOSIS — Z1151 Encounter for screening for human papillomavirus (HPV): Secondary | ICD-10-CM | POA: Diagnosis not present

## 2017-05-25 DIAGNOSIS — Z01411 Encounter for gynecological examination (general) (routine) with abnormal findings: Secondary | ICD-10-CM | POA: Diagnosis not present

## 2017-05-26 DIAGNOSIS — M7502 Adhesive capsulitis of left shoulder: Secondary | ICD-10-CM | POA: Diagnosis not present

## 2017-05-26 DIAGNOSIS — M2550 Pain in unspecified joint: Secondary | ICD-10-CM | POA: Diagnosis not present

## 2017-05-26 DIAGNOSIS — M259 Joint disorder, unspecified: Secondary | ICD-10-CM | POA: Diagnosis not present

## 2017-05-27 LAB — PAP, TP IMAGING W/ HPV RNA, RFLX HPV TYPE 16,18/45: HPV mRNA, High Risk: NOT DETECTED

## 2017-06-08 NOTE — Telephone Encounter (Signed)
Nutrition center Left message for pt to call and schedule for pt to call, encounter will be closed as pt is aware.

## 2017-06-17 DIAGNOSIS — M7502 Adhesive capsulitis of left shoulder: Secondary | ICD-10-CM | POA: Diagnosis not present

## 2017-06-20 DIAGNOSIS — M7502 Adhesive capsulitis of left shoulder: Secondary | ICD-10-CM | POA: Diagnosis not present

## 2017-07-11 ENCOUNTER — Ambulatory Visit (INDEPENDENT_AMBULATORY_CARE_PROVIDER_SITE_OTHER): Payer: BLUE CROSS/BLUE SHIELD | Admitting: Gastroenterology

## 2017-07-11 ENCOUNTER — Encounter: Payer: Self-pay | Admitting: Gastroenterology

## 2017-07-11 VITALS — BP 110/70 | HR 80 | Ht 60.5 in | Wt 238.2 lb

## 2017-07-11 DIAGNOSIS — R1032 Left lower quadrant pain: Secondary | ICD-10-CM | POA: Diagnosis not present

## 2017-07-11 DIAGNOSIS — K439 Ventral hernia without obstruction or gangrene: Secondary | ICD-10-CM | POA: Diagnosis not present

## 2017-07-11 DIAGNOSIS — K5909 Other constipation: Secondary | ICD-10-CM

## 2017-07-11 NOTE — Progress Notes (Signed)
Alison Best    [id number]    08/21/1956  Primary Care Physician:Fry, Ishmael Holter, MD  Referring Physician: Laurey Morale, MD [place], [place] [address]  Chief complaint: Abdominal pain  HPI:  61 year old female with morbid obesity perforated sigmoid diverticulitis status post colostomy and reversal of colostomy in December 2016 here to establish care with complaints of left-sided abdominal pain and ventral hernia. Her bowel habits changed after colostomy reversal and currently has intermittent bowel movements once every 2-3 days with alternating constipation and diarrhea. She had colonoscopy in 2011 with no polyps and left-sided diverticulosis, recall colonoscopy in 10 years. Patient has been gaining weight in the past few years and feels her abdomen is increasing in size and she has worsening pressure and pain in the left side. Denies any nausea, vomiting, hematemesis or blood per rectum. No family or personal history of colon cancer   Outpatient Encounter Prescriptions as of 07/11/2017  Medication Sig  . acetaminophen (TYLENOL) 325 MG tablet Take 2 tablets (650 mg total) by mouth every 6 (six) hours as needed for moderate pain or headache.  . albuterol (PROAIR HFA) 108 (90 Base) MCG/ACT inhaler Inhale 2 puffs into the lungs every 4 (four) hours as needed for wheezing.  Marland Kitchen aspirin 81 MG tablet Take 81 mg by mouth daily.  . diclofenac (VOLTAREN) 25 MG EC tablet Take 25 mg by mouth 2 (two) times daily.  Marland Kitchen diltiazem (CARDIZEM CD) 120 MG 24 hr capsule Take 1 capsule (120 mg total) by mouth daily.  . furosemide (LASIX) 20 MG tablet TAKE 1 TABLET BY MOUTH DAILY AS NEEDED (FLUID)  . LevOCARNitine (CARNITINE PO) Take by mouth daily.  . metFORMIN (GLUCOPHAGE) 500 MG tablet Take 1 tablet (500 mg total) by mouth 2 (two) times daily with a meal.  . methimazole (TAPAZOLE) 5 MG tablet Take 1 tablet in a.m.  And 1/2 tab is the p.m  . Multiple Vitamins-Minerals  (MULTIVITAMIN WITH MINERALS) tablet Take 1 tablet by mouth daily.  . multivitamin-lutein (OCUVITE-LUTEIN) CAPS capsule Take 1 capsule by mouth daily.   No facility-administered encounter medications on file as of 07/11/2017.     Allergies as of 07/11/2017 - Review Complete 07/11/2017  Allergen Reaction Noted  . Bupropion Nausea And Vomiting 09/22/2015  . Levaquin [levofloxacin] Other (See Comments) 02/21/2015    Past Medical History:  Diagnosis Date  . Anal fissure   . Arthritis   . Bursitis   . Diverticulitis of colon 02/2015  . Ectopic pregnancy   . Heart murmur   . Hyperlipidemia   . Hyperthyroidism   . Irregular heartbeat   . Obesity   . Sleep apnea   . Thyroid disease    hyperthyroid    Past Surgical History:  Procedure Laterality Date  . APPENDECTOMY    . CESAREAN SECTION    . COLONOSCOPY  09-10-10   per Dr. Acquanetta Sit, diverticulosis of descending colon and sigmoid, internal hemorrhoids, repeat in 10 yrs  . COLOSTOMY N/A 02/20/2015   Procedure: COLOSTOMY;  Surgeon: Jackolyn Confer, MD;  Location: [facility];  Service: General;  Laterality: N/A;  . COLOSTOMY REVISION  02/20/2015   Procedure: Sigmoid Colectomy;  Surgeon: Jackolyn Confer, MD;  Location: [facility];  Service: General;;  . CYSTOSCOPY N/A 02/20/2015   Procedure: Consuela Mimes;  Surgeon: Kathie Rhodes, MD;  Location: [facility];  Service: Urology;  Laterality: N/A;  . DILATION AND CURETTAGE OF UTERUS  x3  . ILEO LOOP COLOSTOMY CLOSURE N/A 09/26/2015   Procedure: LAPAROSCOPIC LYSIS OF ADHESIONS, COLOSTOMY CLOSURE;  Surgeon: Jackolyn Confer, MD;  Location: [facility];  Service: General;  Laterality: N/A;  . LAPAROTOMY N/A 02/20/2015   Procedure: Emergency EXPLORATORY and drainiage of interabdominal abcesses;  Surgeon: Jackolyn Confer, MD;  Location: [facility];  Service: General;  Laterality: N/A;  . SALPINGOOPHORECTOMY Left 02/20/2015   Procedure: SALPINGO OOPHORECTOMY;  Surgeon: Jackolyn Confer, MD;  Location: [facility];  Service:  General;  Laterality: Left;  . TONSILLECTOMY  1980  . TONSILLECTOMY      Family History  Problem Relation Age of Onset  . Cancer Mother   . Breast cancer Mother   . CAD Father        CABG  . Skin cancer Father   . COPD Other   . Allergies Brother   . Cancer Maternal Grandmother   . Breast cancer Maternal Grandmother   . Heart attack Maternal Grandfather   . Heart attack Paternal Grandfather   . Stroke Neg Hx   . Hypertension Neg Hx     Social History   Social History  . Marital status: Married    Spouse name: N/A  . Number of children: 1  . Years of education: N/A   Occupational History  . Equities trader at [employer]  . Smoking status: Current Every Day Smoker    Packs/day: 0.25    Years: 40.00    Types: Cigarettes    Last attempt to quit: 02/07/2015  . Smokeless tobacco: Never Used     Comment: less than 1/2 pack a day  . Alcohol use 0.0 oz/week     Comment: occ  . Drug use: No  . Sexual activity: Yes    Partners: Male     Comment: 1ST intercourse- 4, partners- 22,  married x 32 yrs    Other Topics Concern  . Not on file   Social History Narrative  . No narrative on file      Review of systems: Review of Systems  Constitutional: Negative for fever and chills.  HENT: Positive for sinus problem  Eyes: Negative for blurred vision.  Respiratory: Negative for cough, shortness of breath and wheezing.   Cardiovascular: Negative for chest pain and palpitations.  Gastrointestinal: as per HPI Genitourinary: Negative for dysuria, urgency, frequency and hematuria.  Musculoskeletal: Positive for myalgias, back pain and joint pain.  Skin: Negative for itching and rash.  Neurological: Negative for dizziness, tremors, focal weakness, seizures and loss of consciousness.  Endo/Heme/Allergies: Positive for seasonal allergies.  Psychiatric/Behavioral: Negative for depression, suicidal ideas and  hallucinations.  All other systems reviewed and are negative.   Physical Exam: Vitals:   07/11/17 0921  BP: 110/70  Pulse: 80   Body mass index is 45.75 kg/m. Gen:      No acute distress HEENT:  EOMI, sclera anicteric Neck:     No masses; no thyromegaly Lungs:    Clear to auscultation bilaterally; normal respiratory effort CV:         Regular rate and rhythm; no murmurs Abd:      + bowel sounds; soft, non-tender; no palpable masses, large panus with midline scar and L side ventral hernia, reducible Ext:    No edema; adequate peripheral perfusion Skin:      Warm and dry; no rash Neuro: alert and oriented x 3 Psych: normal mood and affect  Data Reviewed:  Reviewed labs, radiology imaging, old records and pertinent past GI work up   Assessment and Plan/Recommendations:  61 year old female with morbid obesity, perforated sigmoid diverticulitis, colostomy, status post reversal of colostomy in December 2016 here with complaints of large ventral hernia and left-sided abdominal pain Patient is followed by Dr. Barkley Bruns at [place] and plan to manage her conservatively unless has any complications from the hernia Advised patient to wear an abdominal binder to support the abdominal wall Discussed weight loss, patient is interested in following diet and exercise to lose weight Refer to dietitian  Constipation with alternating intermittent episodes of diarrhea: Start soluble fiber 1 tablespoon 3 times daily with meals and add MiraLAX half to one capful daily at bedtime as needed  Colorectal cancer screening: Average risk, due for recall colonoscopy in 2021    K. Denzil Magnuson , MD [phone] Mon-Fri 8a-5p [phone] after 5p, weekends, holidays  CC: Laurey Morale, MD

## 2017-07-11 NOTE — Patient Instructions (Signed)
We have given you information on contacting [facility] for information on weight loss surgery  Take Benefiber 1 tablespoon three times a day with meals if no improvement take miralax 1/2 capful daily at bedtime   Your recall colonoscopy will be in 2021

## 2017-07-14 ENCOUNTER — Encounter: Payer: Self-pay | Admitting: Family Medicine

## 2017-07-16 ENCOUNTER — Other Ambulatory Visit: Payer: Self-pay | Admitting: Family Medicine

## 2017-07-19 ENCOUNTER — Ambulatory Visit: Payer: BLUE CROSS/BLUE SHIELD | Admitting: Registered"

## 2017-07-19 NOTE — Telephone Encounter (Signed)
Can we refill this? 

## 2017-07-24 ENCOUNTER — Other Ambulatory Visit: Payer: Self-pay | Admitting: Internal Medicine

## 2017-07-26 ENCOUNTER — Encounter: Payer: Self-pay | Admitting: Registered"

## 2017-07-26 ENCOUNTER — Encounter: Payer: BLUE CROSS/BLUE SHIELD | Attending: Family Medicine | Admitting: Registered"

## 2017-07-26 DIAGNOSIS — E669 Obesity, unspecified: Secondary | ICD-10-CM

## 2017-07-26 DIAGNOSIS — Z713 Dietary counseling and surveillance: Secondary | ICD-10-CM | POA: Insufficient documentation

## 2017-07-26 DIAGNOSIS — Z6841 Body Mass Index (BMI) 40.0 and over, adult: Secondary | ICD-10-CM | POA: Diagnosis not present

## 2017-07-26 NOTE — Progress Notes (Signed)
Medical Nutrition Therapy:  Appt start time: 8:12 end time:  9:00.   Assessment: Primary concerns today: needs to lose weight and be more active. Pt states highest weight was 248 lbs. Pt states metoprolol made her feel lethargic and "spacy" in the back of her mind; switch to new medication has helped. Pt states her dad died 05/16/2016, began taking care of her mom; increased stress. 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 Pt states her mom keeps ice cream and junk food snacks at her house. Pt states work keep her busy and she works 40+ hours a week for [employer]. Pt states her son lives  with her; he only eats non-GMO and organic foods.   Pt states she needs to have surgery on incisional hernias but surgeon says she needs to lose weight to be needs to be considered for surgery. Pt states the lap band was introduced as an idea to lose weight, but pt states she would rather try and lose on her own without having another surgery. Pt states she feels more mobile and currently being more active. Pt states she enjoys salads, does not like to prepare them. Pt states dinner is her heaviest meal. Pt states vegetables do not cause flare-ups with divertiulitis. Pt states she does not like to prepare vegetables because it seems like more work.   Pt states she is going to [place] in March 2019 and wants to be able to walk around without complications. Pt states she wants to lose around 30 lbs between now and then. Pt states she would like to come back for check-ins.   Preferred Learning Style:   No preference indicated   Learning Readiness:   Contemplating  Ready  Change in progress   MEDICATIONS: See list   DIETARY INTAKE:  Usual eating pattern includes 2 meals and 1-2 snacks per day.  Everyday foods include sandwich, salad, chicken, fish, rice, pasta.  Avoided foods include soy, mango, scallops.    24-hr recall:  B ( AM): sometimes skips;  peanut butter toast or bagels  Snk ( AM): crackers, pretzels, or candy  L ( PM): [place] sandwich, fruit or [employer] Snk ( PM): crackers, pretzels, or candy  D ( PM): chicken with rice or pasta or baked potato or fish tacos Snk ( PM): crackers, pretzels, or candy  Beverages: 5 black tea, water  Usual physical activity: physical therapy 2x/week for knee  Estimated energy needs: 1600 calories 180 g carbohydrates 120 g protein 44 g fat  Progress Towards Goal(s):  In progress.   Nutritional Diagnosis:  NI-5.8.5 Inadeqate fiber intake As related to less than optimal food-preparation practices.  As evidenced by dietary recall of minimal high fiber foods.    Intervention:  Nutrition education and counseling. Pt was educated and counseled on ways to add in non-starchy vegetables and physical activity into her daily routine. Pt was educated and counseled My[employer]itness[employer] and tracking food and fluid intake. Pt was educated on portion control, snacks, and eating well -balanced meals.  Goals:  - Increase non-starchy vegetables intake with meals and snacks.  - Track food and fluid with MyFitnessPal.  - Increase physical activity with resistance bands, arm exercises, take walking breaks during the day. Goal is at least 30 min/day, 5 days/week.   Teaching Method Utilized:  Visual Auditory  Handouts given during visit include:  Low-fiber nutrition therapy  Arm exercises  Barriers to learning/adherence to lifestyle change: work-life balance  Demonstrated degree of understanding via:  Teach Back   Monitoring/Evaluation:  Dietary intake, exercise, and body weight in 3 week(s).

## 2017-07-26 NOTE — Patient Instructions (Addendum)
-   Increase non-starchy vegetables intake with meals and snacks.   - Track food and fluid with MyFitnessPal.   - Increase physical activity with resistance bands, arm exercises, take walking breaks during the day. Goal is at least 30 min/day, 5 days/week.

## 2017-07-29 DIAGNOSIS — M2550 Pain in unspecified joint: Secondary | ICD-10-CM | POA: Diagnosis not present

## 2017-07-29 DIAGNOSIS — R5383 Other fatigue: Secondary | ICD-10-CM | POA: Diagnosis not present

## 2017-07-29 DIAGNOSIS — M150 Primary generalized (osteo)arthritis: Secondary | ICD-10-CM | POA: Diagnosis not present

## 2017-07-29 DIAGNOSIS — E0500 Thyrotoxicosis with diffuse goiter without thyrotoxic crisis or storm: Secondary | ICD-10-CM | POA: Diagnosis not present

## 2017-08-01 DIAGNOSIS — M25562 Pain in left knee: Secondary | ICD-10-CM | POA: Diagnosis not present

## 2017-08-03 DIAGNOSIS — M25562 Pain in left knee: Secondary | ICD-10-CM | POA: Diagnosis not present

## 2017-08-04 ENCOUNTER — Telehealth: Payer: Self-pay | Admitting: Registered"

## 2017-08-04 NOTE — Telephone Encounter (Signed)
Hi Mrs. Makenzie,  I hope you are doing well. You are welcome! It's my pleasure.   We did not discuss a specific calorie amount for you. If you would like to count calories, you can aim around 1600 calories a day. We can discuss how that is going for you at your next appointment. Have a great day and stay dry J  Thank you, Isidore Moos, RD, [place] Registered [place] I Direct Dial: [phone]  Fax: [phone] Website: [name]    From: [name] [email or web address]>  Sent: Tuesday, August 02, 2017 10:08 AM To: Tyrone Nine, Ellise Kovack [email or web address]> Subject: [External Email]thanks and question  *Caution - External Email* Hi Sena Hoopingarner:  Thank you for helping me out recently and for your kind note!    I appreciate your help!  One question - I didn't see in my paperwork a suggested daily calorie intake limit or range.  Can you tell me that? I have started using myfitnesspal - thanks for the recommendation.  They have a daily calorie goal but I think it is different than what you mentioned to me.  I'm not sure why I didn't write it down when I was with you.   Thanks! [name]  ______________________________________________ Dollene Cleveland Dehaan Senior [facility] [place] [address] [place], [place] [address]  Phone: [phone], [phone] FAX: [phone] [email or web address]                            Confidentiality notice: This email message, including any attachments, is for the sole use of the intended recipients and may contain confidential and privileged information. Any unauthorized review, use, disclosure or distribution is prohibited. If you are not the intended recipient, please contact the sender by reply email and destroy all copies of the original message. Thank you!

## 2017-08-15 ENCOUNTER — Telehealth: Payer: Self-pay | Admitting: Registered"

## 2017-08-15 NOTE — Telephone Encounter (Signed)
Good morning Myles Rosenthal, understandable. Thank you for letting me know. Yes, you can call our office at [phone] to reschedule. I look forward to seeing you at your next appointment. Have a great week!  Thank you, Isidore Moos, RD, [place] Registered [place] I Direct Dial: [phone]  Fax: [phone] Website: [email or web address]    From: [name] Both [email or web address]>  Sent: Saturday, August 13, 2017 1:15 PM To: Tyrone Nine, Sterlin Knightly [email or web address]> Subject: [External Email]RE: thanks and question  *Caution - External Email* So sorry to have to reschedule my appointment this week.  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  I'll call office to reschedule.   Thanks, [name]   ____________________________________ Dollene Cleveland [facility] Senior [employer], [place] [facility] [place]. [phone]

## 2017-08-18 ENCOUNTER — Ambulatory Visit: Payer: BLUE CROSS/BLUE SHIELD | Admitting: Registered"

## 2017-08-29 ENCOUNTER — Encounter: Payer: Self-pay | Admitting: Family Medicine

## 2017-08-29 ENCOUNTER — Ambulatory Visit: Payer: BLUE CROSS/BLUE SHIELD | Admitting: Family Medicine

## 2017-08-29 VITALS — BP 126/84 | Temp 99.0°F | Ht 60.0 in | Wt 231.0 lb

## 2017-08-29 DIAGNOSIS — J018 Other acute sinusitis: Secondary | ICD-10-CM | POA: Diagnosis not present

## 2017-08-29 MED ORDER — AZITHROMYCIN 250 MG PO TABS: ORAL_TABLET | ORAL | 0 refills | Status: DC

## 2017-08-29 NOTE — Patient Instructions (Signed)
WE NOW OFFER   [facility] FAST TRACK!!!  SAME DAY Appointments for ACUTE CARE  Such as: Sprains, Injuries, cuts, abrasions, rashes, muscle pain, joint pain, back pain Colds, flu, sore throats, headache, allergies, cough, fever  Ear pain, sinus and eye infections Abdominal pain, nausea, vomiting, diarrhea, upset stomach Animal/insect bites  3 Easy Ways to Schedule: Walk-In Scheduling Call in scheduling Mychart Sign-up: [email or web address]         

## 2017-08-29 NOTE — Progress Notes (Signed)
   Subjective:    Patient ID: Alison Best, female    DOB: 09-02-1956, 61 y.o.   MRN: [id number]  HPI Here for 3 days of sinus pressure, ear pain, PND , and ST. No cough. Using Mucinex.    Review of Systems  Constitutional: Negative.   HENT: Positive for congestion, ear pain, postnasal drip, sinus pressure and sinus pain. Negative for sore throat.   Eyes: Negative.   Respiratory: Negative.        Objective:   Physical Exam  Constitutional: She appears well-developed and well-nourished.  HENT:  Right Ear: External ear normal.  Nose: Nose normal.  Mouth/Throat: Oropharynx is clear and moist.  Left TM is pink   Eyes: Conjunctivae are normal.  Neck: Neck supple. No thyromegaly present.  Pulmonary/Chest: Effort normal and breath sounds normal. No respiratory distress. She has no wheezes. She has no rales.  Lymphadenopathy:    She has no cervical adenopathy.          Assessment & Plan:  Sinsusitis, treat with a Zpack.  Alysia Penna, MD

## 2017-09-05 ENCOUNTER — Telehealth: Payer: Self-pay | Admitting: Family Medicine

## 2017-09-05 NOTE — Telephone Encounter (Signed)
Call in Augmentin 875 bid for 10 days  

## 2017-09-05 NOTE — Telephone Encounter (Signed)
Pt states she has taken the zpak dr fry gave her, but thinks it has turned into bronchitis. Pt coughing up green mucus. Wanting another Rx called in.  [facility] [place], [place]

## 2017-09-06 MED ORDER — AMOXICILLIN-POT CLAVULANATE 875-125 MG PO TABS: 1.0000 | ORAL_TABLET | Freq: Two times a day (BID) | ORAL | 0 refills | Status: DC

## 2017-09-06 NOTE — Telephone Encounter (Signed)
I sent script e-scribe to [facility] and left a voice message for pt with this information.

## 2017-09-30 ENCOUNTER — Encounter: Payer: Self-pay | Admitting: Internal Medicine

## 2017-09-30 ENCOUNTER — Ambulatory Visit: Payer: BLUE CROSS/BLUE SHIELD | Admitting: Internal Medicine

## 2017-09-30 VITALS — BP 126/84 | HR 68 | Ht 60.0 in | Wt 231.2 lb

## 2017-09-30 DIAGNOSIS — E0590 Thyrotoxicosis, unspecified without thyrotoxic crisis or storm: Secondary | ICD-10-CM | POA: Diagnosis not present

## 2017-09-30 DIAGNOSIS — E0580 Other thyrotoxicosis without thyrotoxic crisis or storm: Secondary | ICD-10-CM

## 2017-09-30 DIAGNOSIS — E042 Nontoxic multinodular goiter: Secondary | ICD-10-CM

## 2017-09-30 DIAGNOSIS — R7303 Prediabetes: Secondary | ICD-10-CM

## 2017-09-30 LAB — T3, FREE: T3, Free: 3.1 pg/mL (ref 2.3–4.2)

## 2017-09-30 LAB — POCT GLYCOSYLATED HEMOGLOBIN (HGB A1C): HEMOGLOBIN A1C: 5.5

## 2017-09-30 LAB — T4, FREE: Free T4: 0.61 ng/dL (ref 0.60–1.60)

## 2017-09-30 LAB — TSH: TSH: 10.28 u[IU]/mL — AB (ref 0.35–4.50)

## 2017-09-30 NOTE — Progress Notes (Addendum)
Patient ID: Alison Best, female   DOB: 11-Apr-1956, 61 y.o.   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 Last OV was 6 months ago.  She uses Myfitness pal >> lost 10 lbs >> gained them back: traveling, Holidays.  She had more constipation >> now on benefiber >> feeling better.  She will go on a trip to [place] in March.  Subclinical thyrotoxicosis:  I reviewed pt's thyroid tests: Lab Results  Component Value Date   TSH 1.74 04/01/2017   TSH 1.42 02/09/2017   TSH 0.06 (L) 10/01/2016   TSH 0.88 05/12/2016   TSH 4.46 02/26/2016   TSH 0.60 12/29/2015   TSH 0.13 (L) 08/06/2015   TSH 0.04 (L) 07/07/2015   TSH 0.08 (L) 06/03/2015   TSH 0.016 (L) 02/25/2015   FREET4 0.70 04/01/2017   FREET4 0.62 02/09/2017   FREET4 1.18 10/01/2016   FREET4 0.66 05/12/2016   FREET4 0.50 (L) 02/26/2016   FREET4 0.96 12/29/2015   FREET4 0.49 (L) 08/06/2015   FREET4 1.14 09/16/2014   FREET4 0.97 06/18/2014   FREET4 1.12 04/22/2014   Lab Results  Component Value Date   TSI 111 06/18/2014   07/10/2014 Uptake and scan: The 24 hr uptake by the thyroid gland is 28.5%. Normal 24 hr uptakeis 10-30 %. On thyroid imaging, the thyroid activity appears mildly heterogeneous in the right lobe with a possible cold nodule inferiorly. The left lobe activity appears homogeneous.  We started methimazole 5 mg daily in a.m >> last dose change was in 09/2016  - 5 mg in a.m. and 2.5 mg in p.m.  She is feeling well on this dose, and has no side effects from the medication  Thyroid nodules: 07/15/2014 Thyroid U/S: Multiple bilateral thyroid nodules: poorly defined  Pt denies: - feeling nodules in neck - hoarseness - dysphagia - choking - SOB with lying down  Prediabetes:  Reviewed latest HbA1c results -last was higher: Lab Results  Component Value Date   HGBA1C 6.3 04/01/2017   HGBA1C 6.2  10/01/2016   HGBA1C 6.0 02/26/2016   She was on  Metformin 500 mg 2x a day >> stopped in Fall 2015 >> restarted once a day >> at last visit, increased back to 500 mg twice a day - now not missing doses.  She is seeing nutrition for weight loss.  She had colon resection in summer 2016, after an episode of diverticulitis >> she is s/p colonostomy closure.  ROS: Constitutional: no weight gain/no weight loss, no fatigue, no subjective hyperthermia, no subjective hypothermia Eyes: no blurry vision, no xerophthalmia ENT: + sore throat, + see HPI Cardiovascular: no CP/no SOB/no palpitations/no leg swelling Respiratory: + cough/no SOB/no wheezing Gastrointestinal: no N/no V/no D/no C/no acid reflux Musculoskeletal: no muscle aches/+ joint aches Skin: no rashes, no hair loss Neurological: no tremors/no numbness/no tingling/no dizziness  I reviewed pt's medications, allergies, PMH, social hx, family hx, and changes were documented in the history of present illness. Otherwise, unchanged from my initial visit note.  PE: BP 126/84   Pulse 68   Ht 5' (1.524 m)   Wt 231 lb 3.2 oz (104.9 kg)   LMP 03/22/2012   SpO2 98%   BMI 45.15 kg/m   Wt Readings from Last 3 Encounters:  09/30/17 231 lb 3.2 oz (104.9 kg)  08/29/17 231 lb (104.8 kg)  07/26/17 235 lb 8 oz (106.8 kg)  Constitutional: overweight, in NAD Eyes: PERRLA, EOMI, no exophthalmos ENT: moist mucous membranes, no thyromegaly, no cervical lymphadenopathy Cardiovascular: RRR, No MRG Respiratory: CTA B Gastrointestinal: abdomen soft, NT, ND, BS+ Musculoskeletal: no deformities, strength intact in all 4 Skin: moist, warm, no rashes Neurological: no tremor with outstretched hands, DTR normal in all 4  ASSESSMENT: 1. Thyrotoxycosis  07/10/2014: Uptake and scan: 24 hr uptake by the thyroid gland is 28.5%. Normal 24 hr uptake is 10-30 %. On thyroid imaging, the thyroid activity appears mildly heterogeneous in the right lobe with a  possible cold nodule inferiorly. The left lobe activity appears homogeneous.  2. Multinodular goiter  07/15/2014 thyroid ultrasound:  Right thyroid lobe: 4.5 x 1.9 x 1.9 cm. Multiple nodules are noted throughout the right lobe of the thyroid. There is diffuse irregular margined hypoechoic nodule measuring 16 mm in the upper pole. Multiple smaller nodules are identified.  Left thyroid lobe: 4.4 x 1.7 x 1.9 cm. Multiple hypoechoic nodules are noted within the left lobe of the thyroid. The largest of these measures 15 mm in greatest dimension lying within the lower pole  Isthmus Thickness: 0.5 cm. No nodules visualized.  Lymphadenopathy None visualized.  IMPRESSION: Multiple bilateral thyroid nodules. The dominant nodules bilateral meet criteria for percutaneous biopsy.   3. Prediabetes   PLAN:  1. Patient with history of subclinical hypothyroidism, initially with few possible thyrotoxic symptoms: Heat intolerance, palpitations, now resolved.  We reviewed together the report of her previous thyroid uptake and scan that showed possible slightly thyrotoxic nodules - In the past, I recommended to start methimazole, but she delayed this and ended up developing atrial fibrillation.  We finally started methimazole in 05/2015.  We changed the dose since then, currently on 5 mg in a.m. and 2.5 mg in p.m.  She continues on this dose. - No missed methimazole doses anymore.   - She continues on Cardizem, changed from Metoprolol  (started after her A. fib diagnosis) 2/2 dizziness -Today we will check her TSH, free T4, free T3  2. Multinodular goiter -We reviewed together the reports of the previous thyroid ultrasounds that showed several nodules, with the largest one being eligible for FNA.  However, she was hyperthyroid at the time of her ultrasound and it is possible that the nodules were inflammatory (pseudo-nodules).  We discussed about repeating another ultrasound 1 year after she becomes  euthyroid.  3. Prediabetes - Last HbA1c was higher, is 6.3% 6 months ago >> today, HbA1c is 5.5% (much better!) - Continues on metformin 500 mg twice a day, dose increased at last visit - continue checking sugars at different times of the day - check 1x a day, rotating checks - advised for yearly eye exams  - Return to clinic in 6 mo with sugar log   Will need Thyroid U/S if results are normal.  Component     Latest Ref Rng & Units 09/30/2017  TSH     0.35 - 4.50 uIU/mL 10.28 (H)  Triiodothyronine,Free,Serum     2.3 - 4.2 pg/mL 3.1  T4,Free(Direct)     0.60 - 1.60 ng/dL 0.61  Hemoglobin A1C      5.5   TSH high >> decrease MMI to only 2.5 mg daily and recheck TFTs in 1.5 mo.  US THYROID  Order: [id number]  Status:  Final result Visible to patient:  No (Not Released) Dx:  Multiple thyroid nodules  Details   Reading Physician Reading Date Result Priority  Corrie Mckusick, DO 10/20/2017  Narrative    CLINICAL DATA: 61 year old female  EXAM: THYROID ULTRASOUND  TECHNIQUE: Ultrasound examination of the thyroid gland and adjacent soft tissues was performed.  COMPARISON: 07/15/2014  FINDINGS: Parenchymal Echotexture: Moderately heterogenous  Isthmus: 0.7 cm  Right lobe: 5.3 cm x 2.1 cm x 2.4 cm  Left lobe: 5.4 cm x 2.3 cm x 2.3 cm  _________________________________________________________  Estimated total number of nodules >/= 1 cm: 1  Number of spongiform nodules >/= 2 cm not described below (TR1): 0  Number of mixed cystic and solid nodules >/= 1.5 cm not described below ([place]): 0  _________________________________________________________  Nodule # 1:  Location: Right; Inferior  Maximum size: 1.5 cm; Other 2 dimensions: 1.1 cm x 1.3 cm  Composition: solid/almost completely solid (2)  Echogenicity: isoechoic (1)  Shape: not taller-than-wide (0)  Margins: ill-defined (0)  Echogenic foci: none (0)  ACR TI-RADS total points: 3.  ACR TI-RADS  risk category: TR3 (3 points).  ACR TI-RADS recommendations:  Nodule meets criteria for surveillance  _________________________________________________________  No adenopathy  IMPRESSION: Right inferior thyroid nodule (labeled 1) meets criteria for surveillance, as designated by the newly established ACR TI-RADS criteria. Surveillance ultrasound study recommended to be performed annually up to 5 years. Given that the prior ultrasound was performed 2015, this may serve as the third annual.  Recommendations follow those established by the new ACR TI-RADS criteria ([place] 0355;97:416-384).   Electronically Signed By: Corrie Mckusick D.O. On: 10/20/2017 14:36         Philemon Kingdom, MD PhD [facility] Endocrinology

## 2017-09-30 NOTE — Patient Instructions (Signed)
Please continue Metformin 500 mg 2x a day.  Continue Methimazole 5 mg in am and 2.5 mg in pm.  Please stop at the lab.  Please come back for a follow-up appointment in 6 months.

## 2017-10-02 ENCOUNTER — Other Ambulatory Visit: Payer: Self-pay | Admitting: Cardiovascular Disease

## 2017-10-05 MED ORDER — DILTIAZEM HCL ER COATED BEADS 120 MG PO CP24: 120.0000 mg | ORAL_CAPSULE | Freq: Every day | ORAL | 1 refills | Status: DC

## 2017-10-05 MED ORDER — METHIMAZOLE 5 MG PO TABS: ORAL_TABLET | ORAL | 3 refills | Status: DC

## 2017-10-14 DIAGNOSIS — S83512D Sprain of anterior cruciate ligament of left knee, subsequent encounter: Secondary | ICD-10-CM | POA: Diagnosis not present

## 2017-10-19 ENCOUNTER — Encounter: Payer: BLUE CROSS/BLUE SHIELD | Admitting: Family Medicine

## 2017-10-20 ENCOUNTER — Encounter: Payer: Self-pay | Admitting: Internal Medicine

## 2017-10-20 ENCOUNTER — Ambulatory Visit
Admission: RE | Admit: 2017-10-20 | Discharge: 2017-10-20 | Disposition: A | Discharge status: Home / Self Care | Payer: BLUE CROSS/BLUE SHIELD | Source: Ambulatory Visit | Attending: Internal Medicine | Admitting: Internal Medicine

## 2017-10-20 DIAGNOSIS — E041 Nontoxic single thyroid nodule: Secondary | ICD-10-CM | POA: Diagnosis not present

## 2017-10-21 ENCOUNTER — Encounter: Payer: Self-pay | Admitting: Family Medicine

## 2017-10-26 ENCOUNTER — Encounter: Payer: BLUE CROSS/BLUE SHIELD | Admitting: Family Medicine

## 2017-10-26 ENCOUNTER — Other Ambulatory Visit: Payer: Self-pay | Admitting: Internal Medicine

## 2017-10-31 DIAGNOSIS — M25562 Pain in left knee: Secondary | ICD-10-CM | POA: Diagnosis not present

## 2017-11-02 ENCOUNTER — Encounter: Payer: BLUE CROSS/BLUE SHIELD | Admitting: Family Medicine

## 2017-11-09 ENCOUNTER — Other Ambulatory Visit (INDEPENDENT_AMBULATORY_CARE_PROVIDER_SITE_OTHER): Payer: BLUE CROSS/BLUE SHIELD

## 2017-11-09 DIAGNOSIS — E0580 Other thyrotoxicosis without thyrotoxic crisis or storm: Secondary | ICD-10-CM | POA: Diagnosis not present

## 2017-11-09 LAB — T4, FREE: Free T4: 0.81 ng/dL (ref 0.60–1.60)

## 2017-11-09 LAB — T3, FREE: T3 FREE: 3.1 pg/mL (ref 2.3–4.2)

## 2017-11-09 LAB — TSH: TSH: 4.06 u[IU]/mL (ref 0.35–4.50)

## 2017-11-10 ENCOUNTER — Other Ambulatory Visit: Payer: Self-pay | Admitting: Internal Medicine

## 2017-11-10 DIAGNOSIS — M25562 Pain in left knee: Secondary | ICD-10-CM | POA: Diagnosis not present

## 2017-11-10 DIAGNOSIS — E0580 Other thyrotoxicosis without thyrotoxic crisis or storm: Secondary | ICD-10-CM

## 2017-11-11 ENCOUNTER — Other Ambulatory Visit: Payer: BLUE CROSS/BLUE SHIELD

## 2017-11-11 DIAGNOSIS — M25562 Pain in left knee: Secondary | ICD-10-CM | POA: Diagnosis not present

## 2017-11-11 DIAGNOSIS — M25512 Pain in left shoulder: Secondary | ICD-10-CM | POA: Diagnosis not present

## 2017-11-14 DIAGNOSIS — M25562 Pain in left knee: Secondary | ICD-10-CM | POA: Diagnosis not present

## 2017-11-16 ENCOUNTER — Encounter: Payer: Self-pay | Admitting: Family Medicine

## 2017-11-16 ENCOUNTER — Ambulatory Visit (INDEPENDENT_AMBULATORY_CARE_PROVIDER_SITE_OTHER): Payer: BLUE CROSS/BLUE SHIELD | Admitting: Family Medicine

## 2017-11-16 VITALS — BP 124/80 | HR 73 | Temp 98.3°F | Wt 226.0 lb

## 2017-11-16 DIAGNOSIS — Z0000 Encounter for general adult medical examination without abnormal findings: Secondary | ICD-10-CM

## 2017-11-16 DIAGNOSIS — J018 Other acute sinusitis: Secondary | ICD-10-CM

## 2017-11-16 MED ORDER — ALBUTEROL SULFATE HFA 108 (90 BASE) MCG/ACT IN AERS: 2.0000 | INHALATION_SPRAY | RESPIRATORY_TRACT | 5 refills | Status: DC | PRN

## 2017-11-16 MED ORDER — EPINEPHRINE 0.15 MG/0.15ML IJ SOAJ: 0.1500 mg | INTRAMUSCULAR | 2 refills | Status: DC | PRN

## 2017-11-16 MED ORDER — CIPROFLOXACIN HCL 500 MG PO TABS: 500.0000 mg | ORAL_TABLET | Freq: Two times a day (BID) | ORAL | 0 refills | Status: DC

## 2017-11-16 MED ORDER — AMOXICILLIN-POT CLAVULANATE 875-125 MG PO TABS: 1.0000 | ORAL_TABLET | Freq: Two times a day (BID) | ORAL | 0 refills | Status: DC

## 2017-11-16 NOTE — Progress Notes (Signed)
   Subjective:    Patient ID: Alison Best, female    DOB: [birth year], 62 y.o.   MRN: [id number]  HPI Here for a well exam. She has been doing well until 2 weeks ago when she developed her typical sinus symptoms of pressure, PND, and blowing green mucus from the nose. No cough or fever. She had a recent bout of thyrotoxicosis and she has been seeing Dr. Cruzita Lederer for that. She is on Methimazole, and she has improved. A thyroid panel last week was all normal. She will be going on a trip to [place] next month and wants to take a supply of Cipro with her in case of GI infections. She recently had a surgical follow up with Dr. Marcello Moores and her abdominal wall is intact. She still has a large incisional hernia but this is asymptomatic.    Review of Systems  Constitutional: Negative.   HENT: Positive for congestion, postnasal drip, sinus pressure and sinus pain. Negative for ear pain and sore throat.   Eyes: Negative.   Respiratory: Negative.   Cardiovascular: Negative.   Gastrointestinal: Negative.   Genitourinary: Negative for decreased urine volume, difficulty urinating, dyspareunia, dysuria, enuresis, flank pain, frequency, hematuria, pelvic pain and urgency.  Musculoskeletal: Negative.   Skin: Negative.   Neurological: Negative.   Psychiatric/Behavioral: Negative.        Objective:   Physical Exam  Constitutional: She is oriented to person, place, and time. She appears well-developed and well-nourished. No distress.  HENT:  Head: Normocephalic and atraumatic.  Right Ear: External ear normal.  Left Ear: External ear normal.  Nose: Nose normal.  Mouth/Throat: Oropharynx is clear and moist. No oropharyngeal exudate.  Eyes: Conjunctivae and EOM are normal. Pupils are equal, round, and reactive to light. No scleral icterus.  Neck: Normal range of motion. Neck supple. No JVD present. No thyromegaly present.  Cardiovascular: Normal rate, regular rhythm, normal heart sounds and intact distal  pulses. Exam reveals no gallop and no friction rub.  No murmur heard. Pulmonary/Chest: Effort normal and breath sounds normal. No respiratory distress. She has no wheezes. She has no rales. She exhibits no tenderness.  Musculoskeletal: Normal range of motion. She exhibits no edema or tenderness.  Lymphadenopathy:    She has no cervical adenopathy.  Neurological: She is alert and oriented to person, place, and time. She has normal reflexes. No cranial nerve deficit. She exhibits normal muscle tone. Coordination normal.  Skin: Skin is warm and dry. No rash noted. No erythema.  Psychiatric: She has a normal mood and affect. Her behavior is normal. Judgment and thought content normal.          Assessment & Plan:  Well exam. We discussed diet and exercise. Certainly losing weight would help her with numerous issues. Get fasting labs today. Given a supply of Cipro for travel. Treat the sinusitis with Augmentin. Alysia Penna, MD

## 2017-11-17 LAB — CBC WITH DIFFERENTIAL/PLATELET
BASOS ABS: 0.1 10*3/uL (ref 0.0–0.1)
Basophils Relative: 0.7 % (ref 0.0–3.0)
EOS ABS: 0.1 10*3/uL (ref 0.0–0.7)
Eosinophils Relative: 1.4 % (ref 0.0–5.0)
HCT: 43.4 % (ref 36.0–46.0)
Hemoglobin: 14.6 g/dL (ref 12.0–15.0)
Lymphocytes Relative: 32.8 % (ref 12.0–46.0)
Lymphs Abs: 3.1 10*3/uL (ref 0.7–4.0)
MCHC: 33.5 g/dL (ref 30.0–36.0)
MCV: 92.5 fl (ref 78.0–100.0)
MONOS PCT: 6.6 % (ref 3.0–12.0)
Monocytes Absolute: 0.6 10*3/uL (ref 0.1–1.0)
NEUTROS ABS: 5.6 10*3/uL (ref 1.4–7.7)
NEUTROS PCT: 58.5 % (ref 43.0–77.0)
PLATELETS: 288 10*3/uL (ref 150.0–400.0)
RBC: 4.69 Mil/uL (ref 3.87–5.11)
RDW: 15.3 % (ref 11.5–15.5)
WBC: 9.5 10*3/uL (ref 4.0–10.5)

## 2017-11-17 LAB — LIPID PANEL
CHOL/HDL RATIO: 12
Cholesterol: 270 mg/dL — ABNORMAL HIGH (ref 0–200)
HDL: 22.2 mg/dL — ABNORMAL LOW (ref >39.00)
Triglycerides: 462 mg/dL — ABNORMAL HIGH (ref 0.0–149.0)

## 2017-11-17 LAB — HEPATIC FUNCTION PANEL
ALK PHOS: 72 U/L (ref 39–117)
ALT: 35 U/L (ref 0–35)
AST: 22 U/L (ref 0–37)
Albumin: 4.3 g/dL (ref 3.5–5.2)
BILIRUBIN TOTAL: 0.5 mg/dL (ref 0.2–1.2)
Bilirubin, Direct: 0.1 mg/dL (ref 0.0–0.3)
Total Protein: 6.8 g/dL (ref 6.0–8.3)

## 2017-11-17 LAB — BASIC METABOLIC PANEL
BUN: 12 mg/dL (ref 6–23)
CALCIUM: 9.3 mg/dL (ref 8.4–10.5)
CO2: 27 meq/L (ref 19–32)
CREATININE: 0.71 mg/dL (ref 0.40–1.20)
Chloride: 103 mEq/L (ref 96–112)
GFR: 89.17 mL/min (ref >60.00)
GLUCOSE: 89 mg/dL (ref 70–99)
Potassium: 4 mEq/L (ref 3.5–5.1)
Sodium: 139 mEq/L (ref 135–145)

## 2017-11-17 LAB — POCT URINALYSIS DIPSTICK
BILIRUBIN UA: NEGATIVE
CLARITY UA: NEGATIVE
GLUCOSE UA: NEGATIVE
Ketones, UA: NEGATIVE
LEUKOCYTES UA: NEGATIVE
Nitrite, UA: NEGATIVE
Protein, UA: 0.2
RBC UA: NEGATIVE
Spec Grav, UA: >=1.03 — AB (ref 1.010–1.025)
Urobilinogen, UA: 0.2 E.U./dL
pH, UA: 6 (ref 5.0–8.0)

## 2017-11-17 LAB — LDL CHOLESTEROL, DIRECT: Direct LDL: 76 mg/dL

## 2017-11-17 NOTE — Addendum Note (Signed)
Addended by: Myriam Forehand on: 11/17/2017 09:06 AM   Modules accepted: Orders

## 2017-11-18 DIAGNOSIS — M25562 Pain in left knee: Secondary | ICD-10-CM | POA: Diagnosis not present

## 2017-11-21 ENCOUNTER — Telehealth: Payer: Self-pay | Admitting: Family Medicine

## 2017-11-21 NOTE — Telephone Encounter (Signed)
Can we resend in new script for Epi Pen, the Brooke Bonito was ordered.

## 2017-11-21 NOTE — Telephone Encounter (Signed)
Sent to PCP ?

## 2017-11-22 MED ORDER — EPINEPHRINE 0.3 MG/0.3ML IJ SOAJ: 0.3000 mg | Freq: Once | INTRAMUSCULAR | 2 refills | Status: DC

## 2017-11-22 NOTE — Telephone Encounter (Signed)
Change this to the adult EpiPen, dual pack with 2 rf

## 2017-11-22 NOTE — Telephone Encounter (Signed)
Rx has been sent. Jr epi pen removed.

## 2017-11-25 DIAGNOSIS — M25562 Pain in left knee: Secondary | ICD-10-CM | POA: Diagnosis not present

## 2017-11-29 DIAGNOSIS — M25562 Pain in left knee: Secondary | ICD-10-CM | POA: Diagnosis not present

## 2017-12-02 DIAGNOSIS — M25562 Pain in left knee: Secondary | ICD-10-CM | POA: Diagnosis not present

## 2017-12-07 DIAGNOSIS — M25562 Pain in left knee: Secondary | ICD-10-CM | POA: Diagnosis not present

## 2017-12-09 DIAGNOSIS — M25562 Pain in left knee: Secondary | ICD-10-CM | POA: Diagnosis not present

## 2017-12-12 ENCOUNTER — Encounter: Payer: Self-pay | Admitting: Internal Medicine

## 2017-12-12 ENCOUNTER — Other Ambulatory Visit: Payer: Self-pay | Admitting: Family Medicine

## 2017-12-12 DIAGNOSIS — M25562 Pain in left knee: Secondary | ICD-10-CM | POA: Diagnosis not present

## 2017-12-13 NOTE — Telephone Encounter (Signed)
Sent to PCP to advise 

## 2017-12-14 NOTE — Telephone Encounter (Signed)
Patient is calling states the sinus infection is back and she has not heard anything back. Please contact patient.   [phone]

## 2017-12-16 ENCOUNTER — Telehealth: Payer: Self-pay | Admitting: Family Medicine

## 2017-12-16 ENCOUNTER — Ambulatory Visit: Payer: Self-pay

## 2017-12-16 MED ORDER — AMOXICILLIN-POT CLAVULANATE 875-125 MG PO TABS: 1.0000 | ORAL_TABLET | Freq: Two times a day (BID) | ORAL | 0 refills | Status: DC

## 2017-12-16 NOTE — Telephone Encounter (Signed)
Sent to PCP for approval  Last OV 11/16/2017   Last refilled 11/16/2017 disp 20 with no refills

## 2017-12-16 NOTE — Telephone Encounter (Signed)
See triage note, a script was sent in.

## 2017-12-16 NOTE — Telephone Encounter (Signed)
Pt. Treated for sinusitis the end of January with Augmentin. Last weekend symptoms returned. Pain pressure and green discharge. Requests medication be called in if possible. If not she is willing to come back in for office visit. Reason for Disposition . [1] Sinus congestion (pressure, fullness) AND [2] present > 10 days  Answer Assessment - Initial Assessment Questions 1. LOCATION: "Where does it hurt?"      Sinus pressure 2. ONSET: "When did the sinus pain start?"  (e.g., hours, days)      Last week 3. SEVERITY: "How bad is the pain?"   (Scale 1-10; mild, moderate or severe)   - MILD (1-3): doesn't interfere with normal activities    - MODERATE (4-7): interferes with normal activities (e.g., work or school) or awakens from sleep   - SEVERE (8-10): excruciating pain and patient unable to do any normal activities        Moderate 4. RECURRENT SYMPTOM: "Have you ever had sinus problems before?" If so, ask: "When was the last time?" and "What happened that time?"      Yes- treated the end of January 5. NASAL CONGESTION: "Is the nose blocked?" If so, ask, "Can you open it or must you breathe through the mouth?"     Partially 6. NASAL DISCHARGE: "Do you have discharge from your nose?" If so ask, "What color?"     Green 7. FEVER: "Do you have a fever?" If so, ask: "What is it, how was it measured, and when did it start?"      Low grade 8. OTHER SYMPTOMS: "Do you have any other symptoms?" (e.g., sore throat, cough, earache, difficulty breathing)     No 9. PREGNANCY: "Is there any chance you are pregnant?" "When was your last menstrual period?"     No  Protocols used: SINUS PAIN OR CONGESTION-A-AH

## 2017-12-16 NOTE — Telephone Encounter (Signed)
I spoke with pt, a script was sent in around 8 am this morning, pt will check with pharmacy.

## 2017-12-19 DIAGNOSIS — M25562 Pain in left knee: Secondary | ICD-10-CM | POA: Diagnosis not present

## 2018-01-27 DIAGNOSIS — M79644 Pain in right finger(s): Secondary | ICD-10-CM | POA: Diagnosis not present

## 2018-01-27 DIAGNOSIS — M25562 Pain in left knee: Secondary | ICD-10-CM | POA: Diagnosis not present

## 2018-04-26 ENCOUNTER — Encounter: Payer: Self-pay | Admitting: Family Medicine

## 2018-04-28 MED ORDER — ACETAZOLAMIDE ER 500 MG PO CP12: 500.0000 mg | ORAL_CAPSULE | Freq: Two times a day (BID) | ORAL | 0 refills | Status: DC

## 2018-04-28 NOTE — Telephone Encounter (Signed)
She can take Diamox 500 mg bid during her trip. Call in #[phone] with no rf

## 2018-04-30 ENCOUNTER — Other Ambulatory Visit: Payer: Self-pay | Admitting: Internal Medicine

## 2018-04-30 DIAGNOSIS — E0590 Thyrotoxicosis, unspecified without thyrotoxic crisis or storm: Secondary | ICD-10-CM

## 2018-05-14 ENCOUNTER — Other Ambulatory Visit: Payer: Self-pay | Admitting: Cardiovascular Disease

## 2018-05-28 ENCOUNTER — Other Ambulatory Visit: Payer: Self-pay | Admitting: Family Medicine

## 2018-07-01 ENCOUNTER — Other Ambulatory Visit: Payer: Self-pay | Admitting: Cardiovascular Disease

## 2018-07-14 ENCOUNTER — Other Ambulatory Visit: Payer: Self-pay | Admitting: Family Medicine

## 2018-07-14 DIAGNOSIS — Z1231 Encounter for screening mammogram for malignant neoplasm of breast: Secondary | ICD-10-CM

## 2018-07-20 ENCOUNTER — Encounter: Payer: Self-pay | Admitting: Cardiovascular Disease

## 2018-07-24 ENCOUNTER — Ambulatory Visit: Payer: BLUE CROSS/BLUE SHIELD | Admitting: Cardiovascular Disease

## 2018-07-24 ENCOUNTER — Encounter: Payer: Self-pay | Admitting: Cardiovascular Disease

## 2018-07-24 VITALS — BP 124/76 | HR 69 | Ht 60.0 in | Wt 227.0 lb

## 2018-07-24 DIAGNOSIS — R0989 Other specified symptoms and signs involving the circulatory and respiratory systems: Secondary | ICD-10-CM

## 2018-07-24 DIAGNOSIS — I480 Paroxysmal atrial fibrillation: Secondary | ICD-10-CM

## 2018-07-24 MED ORDER — DILTIAZEM HCL ER COATED BEADS 120 MG PO CP24: 120.0000 mg | ORAL_CAPSULE | Freq: Every day | ORAL | 3 refills | Status: DC

## 2018-07-24 NOTE — Patient Instructions (Signed)

## 2018-07-24 NOTE — Progress Notes (Signed)
Chief Complaint  Patient presents with  . Follow-up    PAF    History of Present Illness: 62 yo female with history of paroxysmal atrial fibrillation, hyperlipidemia, sleep apnea, hyperthyroidism, carotid artery disease and former tobacco abuse who is here today for cardiac follow up. I saw her as a new patient July 2016 for evaluation of abnormal EKG. She was hospitalized at [facility] May 2016 with perforated sigmoid diverticulitis with intra-abdominal abscesses and underwent emergency exploratory laparotomy and drainage of intra-abdominal abscesses, salpingo oophorectomy, descending colostomy, sigmoid colectomy, cystoscopy. During her hospitalization her EKG showed T wave inversions and ST depression. I saw her in the office in July 2016 and arranged an echo and a holter monitor. Echo with normal LV function and TR with no other valve disease. Stress myoview 05/28/15 with no ischemia. When she presented for her echo she was in rapid atrial fib. She was started on Toprol and ASA given CHADS VASC score of 1.Carotid dopplers August 2016 with mild bilateral carotid disease.  She did not tolerate beta blockers due to fatigue. She has had no known recurrence of atrial fibrillation since 2016.  She is here today for follow up. The patient denies any chest pain, dyspnea, palpitations, lower extremity edema, orthopnea, PND, dizziness, near syncope or syncope.   Primary Care Physician: Laurey Morale, MD  Past Medical History:  Diagnosis Date  . Anal fissure   . Arthritis   . Bursitis   . COPD (chronic obstructive pulmonary disease) ([place])   . Diabetes mellitus without complication ([place])    sees Dr. Cruzita Lederer   . Diverticulitis of colon 02/2015  . Ectopic pregnancy   . Heart murmur   . Hyperlipidemia   . Hyperthyroidism   . Irregular heartbeat   . Obesity   . Sleep apnea   . Thyroid disease    hyperthyroid    Past Surgical History:  Procedure Laterality Date  . APPENDECTOMY    . CESAREAN  SECTION    . COLONOSCOPY  09-10-10   per Dr. Acquanetta Sit, diverticulosis of descending colon and sigmoid, internal hemorrhoids, repeat in 10 yrs  . COLOSTOMY N/A 02/20/2015   Procedure: COLOSTOMY;  Surgeon: Jackolyn Confer, MD;  Location: [facility];  Service: General;  Laterality: N/A;  . COLOSTOMY REVISION  02/20/2015   Procedure: Sigmoid Colectomy;  Surgeon: Jackolyn Confer, MD;  Location: [facility];  Service: General;;  . CYSTOSCOPY N/A 02/20/2015   Procedure: Consuela Mimes;  Surgeon: Kathie Rhodes, MD;  Location: [facility];  Service: Urology;  Laterality: N/A;  . DILATION AND CURETTAGE OF UTERUS     x3  . ILEO LOOP COLOSTOMY CLOSURE N/A 09/26/2015   Procedure: LAPAROSCOPIC LYSIS OF ADHESIONS, COLOSTOMY CLOSURE;  Surgeon: Jackolyn Confer, MD;  Location: [facility];  Service: General;  Laterality: N/A;  . LAPAROTOMY N/A 02/20/2015   Procedure: Emergency EXPLORATORY and drainiage of interabdominal abcesses;  Surgeon: Jackolyn Confer, MD;  Location: [facility];  Service: General;  Laterality: N/A;  . SALPINGOOPHORECTOMY Left 02/20/2015   Procedure: SALPINGO OOPHORECTOMY;  Surgeon: Jackolyn Confer, MD;  Location: [facility];  Service: General;  Laterality: Left;  . TONSILLECTOMY  1980    Current Outpatient Medications  Medication Sig Dispense Refill  . acetaminophen (TYLENOL) 325 MG tablet Take 2 tablets (650 mg total) by mouth every 6 (six) hours as needed for moderate pain or headache.    . albuterol (PROAIR HFA) 108 (90 Base) MCG/ACT inhaler Inhale 2 puffs into the lungs every 4 (four) hours as  needed for wheezing. 1 Inhaler 5  . aspirin 81 MG tablet Take 81 mg by mouth daily.    . Coenzyme Q10 (CO Q 10 PO) Take 1 capsule by mouth daily.    Marland Kitchen diltiazem (CARTIA XT) 120 MG 24 hr capsule Take 1 capsule (120 mg total) by mouth daily. 90 capsule 3  . furosemide (LASIX) 20 MG tablet TAKE 1 TABLET BY MOUTH DAILY AS NEEDED (FLUID) 30 tablet 11  . metFORMIN (GLUCOPHAGE) 500 MG tablet TAKE 1 TABLET(500 MG) BY MOUTH TWICE DAILY  WITH A MEAL 180 tablet 0  . methimazole (TAPAZOLE) 5 MG tablet Take 1/2 tab daily with a meal 45 tablet 3  . Multiple Vitamins-Minerals (MULTIVITAMIN WITH MINERALS) tablet Take 1 tablet by mouth daily.    . multivitamin-lutein (OCUVITE-LUTEIN) CAPS capsule Take 1 capsule by mouth daily.    . Omega-3 Fatty Acids (FISH OIL PO) Take 1 capsule by mouth daily.    . TURMERIC PO Take 1 tablet by mouth daily.     No current facility-administered medications for this visit.     Allergies  Allergen Reactions  . Levofloxacin In D5w Other (See Comments)    Achilles tendon weakness  . Mangifera Indica Anaphylaxis  . Mango Flavor Swelling  . Bupropion Nausea And Vomiting  . Levaquin [Levofloxacin] Other (See Comments)    Severe tendonitis Tolerates Cipro    Social History   Socioeconomic History  . Marital status: Married    Spouse name: Not on file  . Number of children: 1  . Years of education: Not on file  . Highest education level: Not on file  Occupational History  . Occupation: Equities trader at [employer]: [employer]  . Financial resource strain: Not on file  . Food insecurity:    Worry: Not on file    Inability: Not on file  . Transportation needs:    Medical: Not on file    Non-medical: Not on file  Tobacco Use  . Smoking status: Current Every Day Smoker    Packs/day: 0.25    Years: 40.00    Pack years: 10.00    Types: Cigarettes  . Smokeless tobacco: Never Used  . Tobacco comment: less than 1/2 pack a day  Substance and Sexual Activity  . Alcohol use: Yes    Alcohol/week: 0.0 standard drinks    Comment: occ  . Drug use: No  . Sexual activity: Yes    Partners: Male    Comment: 1ST intercourse- 38, partners- 59,  married x 32 yrs   Lifestyle  . Physical activity:    Days per week: Not on file    Minutes per session: Not on file  . Stress: Not on file  Relationships  . Social connections:    Talks on phone:  Not on file    Gets together: Not on file    Attends religious service: Not on file    Active member of club or organization: Not on file    Attends meetings of clubs or organizations: Not on file    Relationship status: Not on file  . Intimate partner violence:    Fear of current or ex partner: Not on file    Emotionally abused: Not on file    Physically abused: Not on file    Forced sexual activity: Not on file  Other Topics Concern  . Not on file  Social History Narrative  . Not on file  Family History  Problem Relation Age of Onset  . Breast cancer Mother   . CAD Father        CABG  . Skin cancer Father   . Lung cancer Father   . Diverticulitis Father   . Colon polyps Father   . COPD Other   . Cancer Other   . Heart disease Other   . Stroke Other   . Allergies Brother   . Breast cancer Maternal Grandmother   . Heart attack Maternal Grandfather   . Heart attack Paternal Grandfather   . Hypertension Neg Hx     Review of Systems:  As stated in the HPI and otherwise negative.   BP 124/76   Pulse 69   Ht 5' (1.524 m)   Wt 227 lb (103 kg)   LMP 03/22/2012   SpO2 96%   BMI 44.33 kg/m   Physical Examination:  General: Well developed, well nourished, NAD  HEENT: OP clear, mucus membranes moist  SKIN: warm, dry. No rashes. Neuro: No focal deficits  Musculoskeletal: Muscle strength 5/5 all ext  Psychiatric: Mood and affect normal  Neck: No JVD, no carotid bruits, no thyromegaly, no lymphadenopathy.  Lungs:Clear bilaterally, no wheezes, rhonci, crackles Cardiovascular: Regular rate and rhythm. No murmurs, gallops or rubs. Abdomen:Soft. Bowel sounds present. Non-tender.  Extremities: No lower extremity edema. Pulses are 2 + in the bilateral DP/PT.  Echo 06/03/15: Left ventricle: The cavity size was normal. Wall thickness was normal. Systolic function was normal. The estimated ejection fraction was in the range of 60% to 65%. Wall motion was normal; there  were no regional wall motion abnormalities. - Left atrium: The atrium was moderately dilated. - Tricuspid valve: There was mild-moderate regurgitation. - Pulmonary arteries: Systolic pressure was moderately increased. PA peak pressure: 51 mm Hg (S).  EKG:  EKG is ordered today. The ekg ordered today demonstrates NSR, rate 69 bpm.   Recent Labs: 11/09/2017: TSH 4.06 11/16/2017: ALT 35; BUN 12; Creatinine, Ser 0.71; Hemoglobin 14.6; Platelets 288.0; Potassium 4.0; Sodium 139    Wt Readings from Last 3 Encounters:  07/24/18 227 lb (103 kg)  11/16/17 226 lb (102.5 kg)  09/30/17 231 lb 3.2 oz (104.9 kg)     Other studies Reviewed: Additional studies/ records that were reviewed today include: . Review of the above records demonstrates:    Assessment and Plan:   1. Paroxysmal atrial fibrillation: This occurred in the setting of hyperthyroidism. No recurrence since then. She denies palpitations. CHADS VASC score of 1 (2 if we include her very mild carotid artery disease). If she had recurrence of atrial fibrillation, I would consider starting long term anticoagulation. Since there has been no known recurrence since 2016, will continue Cardizem and ASA only.   2. Carotid disease: Mild bilateral disease by dopplers 2016. Will repeat in 2020.  3. Tobacco abuse: Smoking cessation is advised.   Current medicines are reviewed at length with the patient today.  The patient does not have concerns regarding medicines.  The following changes have been made:  no change  Labs/ tests ordered today include:   No orders of the defined types were placed in this encounter.   Disposition:   F/U with me in 12 months  Signed, Lauree Chandler, MD 07/24/2018 [facility] [place], [place], [place]  [address] Phone: [phone]; Fax: [phone]

## 2018-07-25 ENCOUNTER — Other Ambulatory Visit: Payer: Self-pay | Admitting: Internal Medicine

## 2018-07-25 NOTE — Addendum Note (Signed)
Addended by: Mendel Ryder on: 07/25/2018 10:25 AM   Modules accepted: Orders

## 2018-07-26 ENCOUNTER — Other Ambulatory Visit: Payer: Self-pay | Admitting: Internal Medicine

## 2018-07-26 ENCOUNTER — Telehealth: Payer: Self-pay

## 2018-07-26 DIAGNOSIS — E0590 Thyrotoxicosis, unspecified without thyrotoxic crisis or storm: Secondary | ICD-10-CM

## 2018-07-26 NOTE — Telephone Encounter (Signed)
Please clarify if she is just taking the 2.5 MG or 2.5 and the 5 ?

## 2018-07-26 NOTE — Telephone Encounter (Signed)
Pharmacy called to confirm dosage of methimazole gave instructions from MD on previous note

## 2018-07-26 NOTE — Telephone Encounter (Signed)
Per my last message to her, she should have been on 2.5 mg daily:  Viewed by [facility] [facility] on 11/10/2017 2:07 PM  Written by Philemon Kingdom, MD on 11/10/2017 8:55 AM  Dear Ms Alison Best,  The thyroid tests have now normalized on the 2.5 mg of methimazole daily. Let's continue the current dose and plan to recheck your thyroid tests again in 3 months.  Please call our main office number [phone]) to schedule a lab appointment.  Sincerely,  Philemon Kingdom MD

## 2018-07-26 NOTE — Telephone Encounter (Signed)
Noted.  RX changed and sent.

## 2018-08-16 ENCOUNTER — Ambulatory Visit (INDEPENDENT_AMBULATORY_CARE_PROVIDER_SITE_OTHER): Payer: BLUE CROSS/BLUE SHIELD | Admitting: Obstetrics & Gynecology

## 2018-08-16 ENCOUNTER — Ambulatory Visit
Admission: RE | Admit: 2018-08-16 | Discharge: 2018-08-16 | Disposition: A | Discharge status: Home / Self Care | Payer: BLUE CROSS/BLUE SHIELD | Source: Ambulatory Visit | Attending: Family Medicine | Admitting: Family Medicine

## 2018-08-16 ENCOUNTER — Telehealth: Payer: Self-pay | Admitting: *Deleted

## 2018-08-16 ENCOUNTER — Encounter: Payer: Self-pay | Admitting: Obstetrics & Gynecology

## 2018-08-16 VITALS — BP 126/84 | Ht 60.0 in | Wt 223.0 lb

## 2018-08-16 DIAGNOSIS — Z6841 Body Mass Index (BMI) 40.0 and over, adult: Secondary | ICD-10-CM | POA: Diagnosis not present

## 2018-08-16 DIAGNOSIS — Z780 Asymptomatic menopausal state: Secondary | ICD-10-CM | POA: Diagnosis not present

## 2018-08-16 DIAGNOSIS — Z01419 Encounter for gynecological examination (general) (routine) without abnormal findings: Secondary | ICD-10-CM | POA: Diagnosis not present

## 2018-08-16 DIAGNOSIS — Z1231 Encounter for screening mammogram for malignant neoplasm of breast: Secondary | ICD-10-CM | POA: Diagnosis not present

## 2018-08-16 NOTE — Telephone Encounter (Signed)
-----   Message from Princess Bruins, MD sent at 08/16/2018 12:19 PM EDT ----- Regarding: Refer to [facility] Obesity BMI >40.  Needs weight loss before having a surgery for an abdominal hernia.

## 2018-08-16 NOTE — Telephone Encounter (Signed)
Referral placed in [place] and epic, at [facility] they will call patient to schedule.

## 2018-08-16 NOTE — Progress Notes (Signed)
Alison Best [facility] [birth year] [id number]   History:    62 y.o.  G2P1A1L1 Married.  Son 37 yo.  S/P Lt SO.  Works in [employer].  RP:  Established patient presenting for annual gyn exam   HPI:  Menopause.  No HRT.  No PMB.  No pelvic pain.  Had a bowel perforation secondary to Diverticulitis with Bowel resection, had colostomy.  Following many surgeries, developed a large Left Abdominal Hernia. Needs to loose weight before repair. Obesity with BMI at 43.55.  Osteoarthritis. Cigarette smoker <1/2 pack per day.  Many medical issues including DM and Hyperthyroidism.  Past medical history,surgical history, family history and social history were all reviewed and documented in the EPIC chart.  Gynecologic History Patient's last menstrual period was 03/22/2012. Contraception: post menopausal status Last Pap: 05/2017. Results were: Negative/HPV HR neg Last mammogram: 08/16/2018. Results were: Negative Bone Density: Never Colonoscopy: 2011  Obstetric History OB History  Gravida Para Term Preterm AB Living  2 1     1 1   SAB TAB Ectopic Multiple Live Births      1        # Outcome Date GA Lbr Len/2nd Weight Sex Delivery Anes PTL Lv  2 Ectopic           1 Para              ROS: A ROS was performed and pertinent positives and negatives are included in the history.  GENERAL: No fevers or chills. HEENT: No change in vision, no earache, sore throat or sinus congestion. NECK: No pain or stiffness. CARDIOVASCULAR: No chest pain or pressure. No palpitations. PULMONARY: No shortness of breath, cough or wheeze. GASTROINTESTINAL: No abdominal pain, nausea, vomiting or diarrhea, melena or bright red blood per rectum. GENITOURINARY: No urinary frequency, urgency, hesitancy or dysuria. MUSCULOSKELETAL: No joint or muscle pain, no back pain, no recent trauma. DERMATOLOGIC: No rash, no itching, no lesions. ENDOCRINE: No polyuria, polydipsia, no heat or cold intolerance. No recent change  in weight. HEMATOLOGICAL: No anemia or easy bruising or bleeding. NEUROLOGIC: No headache, seizures, numbness, tingling or weakness. PSYCHIATRIC: No depression, no loss of interest in normal activity or change in sleep pattern.     Exam:   BP 126/84   Ht 5' (1.524 m)   Wt 223 lb (101.2 kg)   LMP 03/22/2012   BMI 43.55 kg/m   Body mass index is 43.55 kg/m.  General appearance : Well developed well nourished female. No acute distress HEENT: Eyes: no retinal hemorrhage or exudates,  Neck supple, trachea midline, no carotid bruits, no thyroidmegaly Lungs: Clear to auscultation, no rhonchi or wheezes, or rib retractions  Heart: Regular rate and rhythm, no murmurs or gallops Breast:Examined in sitting and supine position were symmetrical in appearance, no palpable masses or tenderness,  no skin retraction, no nipple inversion, no nipple discharge, no skin discoloration, no axillary or supraclavicular lymphadenopathy Abdomen: no palpable masses or tenderness, no rebound or guarding Extremities: no edema or skin discoloration or tenderness  Pelvic: Vulva: Normal             Vagina: No gross lesions or discharge  Cervix: No gross lesions or discharge  Uterus  AV, normal size, shape and consistency, non-tender and mobile  Adnexa  Without masses or tenderness  Anus: Normal   Assessment/Plan:  63 y.o. female for annual exam   1. Well female exam with routine gynecological exam Normal gynecologic exam in menopause.  Pap test was  negative with negative high-risk HPV in August 2018.  Breast exam normal.  Screening mammogram October 2019 was negative.  Health labs with family physician who follows her for diabetes mellitus type 2 and hypothyroidism.  Patient also has osteoarthritis.  2. Postmenopausal Well on no HRT.  No PMB.  Recommend vitamin D supplements, calcium intake of 1.5 g/day and regular weightbearing physical activity.  Strongly recommend to quit cigarette smoking.  Will schedule  bone density before age 42.  3. Class 3 severe obesity due to excess calories with serious comorbidity and body mass index (BMI) of 40.0 to 44.9 in adult [facility]) Referred to [place].  Low calorie/low carb diet such as [employer] recommended.  Regular aerobic physical activity 5 times a week and weightlifting every 2 days recommended.  Princess Bruins MD, 11:57 AM 08/16/2018

## 2018-08-18 NOTE — Telephone Encounter (Signed)
Appointment on 09/05/18 @ 2:00pm

## 2018-08-20 ENCOUNTER — Encounter: Payer: Self-pay | Admitting: Obstetrics & Gynecology

## 2018-08-20 NOTE — Patient Instructions (Addendum)
1. Well female exam with routine gynecological exam Normal gynecologic exam in menopause.  Pap test was negative with negative high-risk HPV in August 2018.  Breast exam normal.  Screening mammogram October 2019 was negative.  Health labs with family physician who follows her for diabetes mellitus type 2 and hypothyroidism.  Patient also has osteoarthritis.  2. Postmenopausal Well on no HRT.  No PMB.  Recommend vitamin D supplements, calcium intake of 1.5 g/day and regular weightbearing physical activity.  Strongly recommend to quit cigarette smoking.  Will schedule bone density before age 13.  3. Class 3 severe obesity due to excess calories with serious comorbidity and body mass index (BMI) of 40.0 to 44.9 in adult [facility]) Referred to [place].  Low calorie/low carb diet such as [employer] recommended.  Regular aerobic physical activity 5 times a week and weightlifting every 2 days recommended.  Alison Best, it was a pleasure seeing you today!

## 2018-09-05 ENCOUNTER — Ambulatory Visit: Payer: BLUE CROSS/BLUE SHIELD | Admitting: Registered"

## 2018-09-20 ENCOUNTER — Ambulatory Visit: Payer: BLUE CROSS/BLUE SHIELD | Admitting: Internal Medicine

## 2018-09-20 ENCOUNTER — Encounter: Payer: Self-pay | Admitting: Internal Medicine

## 2018-09-20 VITALS — BP 120/70 | HR 88 | Ht 60.0 in | Wt 227.0 lb

## 2018-09-20 DIAGNOSIS — R7303 Prediabetes: Secondary | ICD-10-CM | POA: Diagnosis not present

## 2018-09-20 DIAGNOSIS — E042 Nontoxic multinodular goiter: Secondary | ICD-10-CM | POA: Diagnosis not present

## 2018-09-20 DIAGNOSIS — E0580 Other thyrotoxicosis without thyrotoxic crisis or storm: Secondary | ICD-10-CM | POA: Diagnosis not present

## 2018-09-20 LAB — T4, FREE: FREE T4: 0.82 ng/dL (ref 0.60–1.60)

## 2018-09-20 LAB — POCT GLYCOSYLATED HEMOGLOBIN (HGB A1C): HEMOGLOBIN A1C: 5.7 % — AB (ref 4.0–5.6)

## 2018-09-20 LAB — TSH: TSH: 2.44 u[IU]/mL (ref 0.35–4.50)

## 2018-09-20 LAB — T3, FREE: T3 FREE: 2.9 pg/mL (ref 2.3–4.2)

## 2018-09-20 MED ORDER — METFORMIN HCL 500 MG PO TABS: ORAL_TABLET | ORAL | 3 refills | Status: DC

## 2018-09-20 NOTE — Patient Instructions (Signed)
Please continue Metformin 500 mg 2x a day.  Continue Methimazole 2.5 mg in pm.  Please stop at the lab.  Please come back for a follow-up appointment in 6 months.

## 2018-09-20 NOTE — Progress Notes (Signed)
Patient ID: Alison Best [facility], female   DOB: 06/04/1956, 62 y.o.   MRN: [id number]   HPI  Alison Best is a 62 y.o.-year-old female, returning for f/u for subclinical thyrotoxicosis and MNG and also prediabetes. Last OV was 1 year ago.   Subclinical thyrotoxicosis: -Patient with mild Graves' disease  She continues on methimazole (started 09/2016). At last visit, she was on 5 mg in a.m. and 2.5 mg in p.m. but she had an elevated TSH >> we decreased the dose to only 2.5 mg daily.  Latest TFTs normal on this dose.  She tells me that she occasionally takes 5 mg (not in the last month) if she feels pressure in her neck, which she attributes to the thyroid.  Reviewed patient's TFTs: Lab Results  Component Value Date   TSH 4.06 11/09/2017   TSH 10.28 (H) 09/30/2017   TSH 1.74 04/01/2017   TSH 1.42 02/09/2017   TSH 0.06 (L) 10/01/2016   TSH 0.88 05/12/2016   TSH 4.46 02/26/2016   TSH 0.60 12/29/2015   TSH 0.13 (L) 08/06/2015   TSH 0.04 (L) 07/07/2015   FREET4 0.81 11/09/2017   FREET4 0.61 09/30/2017   FREET4 0.70 04/01/2017   FREET4 0.62 02/09/2017   FREET4 1.18 10/01/2016   FREET4 0.66 05/12/2016   FREET4 0.50 (L) 02/26/2016   FREET4 0.96 12/29/2015   FREET4 0.49 (L) 08/06/2015   FREET4 1.14 09/16/2014   Lab Results  Component Value Date   TSI 111 06/18/2014   07/10/2014 Uptake and scan: The 24 hr uptake by the thyroid gland is 28.5%. Normal 24 hr uptakeis 10-30 %. On thyroid imaging, the thyroid activity appears mildly heterogeneous in the right lobe with a possible cold nodule inferiorly. The left lobe activity appears homogeneous.   Thyroid nodules: 07/15/2014 Thyroid U/S: Multiple bilateral thyroid nodules: poorly defined  We checked another thyroid U/S (10/20/2017): Location: Right; Inferior Maximum size: 1.5 cm; Other 2 dimensions: 1.1 cm x 1.3 cm Composition: solid/almost completely solid (2) Echogenicity: isoechoic (1)  IMPRESSION: Right  inferior thyroid nodule (labeled 1) meets criteria for surveillance, as designated by the newly established ACR TI-RADS criteria. Surveillance ultrasound study recommended to be performed annually up to 5 years. Given that the prior ultrasound was performed 2015, this may serve as the third annual.  Pt denies: - hoarseness - choking - SOB with lying down She increased the MMI dose to 5 mg for 2 days if she feels neck pressure and dysphagia- occasional.  Prediabetes:  Review latest HbA1c Lab Results  Component Value Date   HGBA1C 5.5 09/30/2017   HGBA1C 6.3 04/01/2017   HGBA1C 6.2 10/01/2016   She continues on metformin 500 mg 2x a day.  She tolerates this well.  Latest kidney tests were normal: Lab Results  Component Value Date   BUN 12 11/16/2017   BUN 21 02/26/2016   CREATININE 0.71 11/16/2017   CREATININE 0.68 02/26/2016   She has a history of hypertriglyceridemia: Lab Results  Component Value Date   CHOL 270 (H) 11/16/2017   HDL 22.20 (L) 11/16/2017   LDLCALC 93 11/02/2011   LDLDIRECT 76.0 11/16/2017   TRIG (H) 11/16/2017    462.0 Triglyceride is over 400; calculations on Lipids are invalid.   CHOLHDL 12 11/16/2017   She cut out alcohol after the above results returned.  She is seen by nutrition for weight loss.  She had colon resection in summer 2016, after an episode of diverticulitis >> she is s/p colonostomy closure.  She had a trip to [place] in March.  ROS: Constitutional: + Both weight gain and weight loss, no fatigue, no subjective hyperthermia, no subjective hypothermia Eyes: no blurry vision, no xerophthalmia ENT: no sore throat, + see HPI Cardiovascular: no CP/no SOB/+ mild palpitations/no leg swelling Respiratory: no cough/no SOB/no wheezing Gastrointestinal: no N/no V/no D/no C/no acid reflux Musculoskeletal: + Muscle aches/+ joint aches Skin: no rashes, no hair loss Neurological: no tremors/no numbness/no tingling/no dizziness  I reviewed  pt's medications, allergies, PMH, social hx, family hx, and changes were documented in the history of present illness. Otherwise, unchanged from my initial visit note. Started Fish oil. Stopped Diclofenac - was taking this for joint pain.  Past Medical History:  Diagnosis Date  . Anal fissure   . Arthritis   . Bursitis   . COPD (chronic obstructive pulmonary disease) ([place])   . Diabetes mellitus without complication ([place])    sees Dr. Cruzita Lederer   . Diverticulitis of colon 02/2015  . Ectopic pregnancy   . Heart murmur   . Hyperlipidemia   . Hyperthyroidism   . Irregular heartbeat   . Obesity   . Sleep apnea   . Thyroid disease    hyperthyroid   Past Surgical History:  Procedure Laterality Date  . APPENDECTOMY    . CESAREAN SECTION    . COLONOSCOPY  09-10-10   per Dr. Acquanetta Sit, diverticulosis of descending colon and sigmoid, internal hemorrhoids, repeat in 10 yrs  . COLOSTOMY N/A 02/20/2015   Procedure: COLOSTOMY;  Surgeon: Jackolyn Confer, MD;  Location: [facility];  Service: General;  Laterality: N/A;  . COLOSTOMY REVISION  02/20/2015   Procedure: Sigmoid Colectomy;  Surgeon: Jackolyn Confer, MD;  Location: [facility];  Service: General;;  . CYSTOSCOPY N/A 02/20/2015   Procedure: Consuela Mimes;  Surgeon: Kathie Rhodes, MD;  Location: [facility];  Service: Urology;  Laterality: N/A;  . DILATION AND CURETTAGE OF UTERUS     x3  . ILEO LOOP COLOSTOMY CLOSURE N/A 09/26/2015   Procedure: LAPAROSCOPIC LYSIS OF ADHESIONS, COLOSTOMY CLOSURE;  Surgeon: Jackolyn Confer, MD;  Location: [facility];  Service: General;  Laterality: N/A;  . LAPAROTOMY N/A 02/20/2015   Procedure: Emergency EXPLORATORY and drainiage of interabdominal abcesses;  Surgeon: Jackolyn Confer, MD;  Location: [facility];  Service: General;  Laterality: N/A;  . SALPINGOOPHORECTOMY Left 02/20/2015   Procedure: SALPINGO OOPHORECTOMY;  Surgeon: Jackolyn Confer, MD;  Location: [facility];  Service: General;  Laterality: Left;  . TONSILLECTOMY  1980   Social  History   Socioeconomic History  . Marital status: Married    Spouse name: Not on file  . Number of children: 1  . Years of education: Not on file  . Highest education level: Not on file  Occupational History  . Occupation: Equities trader at [employer]: [employer]  . Financial resource strain: Not on file  . Food insecurity:    Worry: Not on file    Inability: Not on file  . Transportation needs:    Medical: Not on file    Non-medical: Not on file  Tobacco Use  . Smoking status: Current Every Day Smoker    Packs/day: 0.25    Years: 40.00    Pack years: 10.00    Types: Cigarettes  . Smokeless tobacco: Never Used  . Tobacco comment: less than 1/2 pack a day  Substance and Sexual Activity  . Alcohol use: Yes    Alcohol/week: 0.0 standard drinks  Comment: occ  . Drug use: No  . Sexual activity: Yes    Partners: Male    Comment: 1ST intercourse- 34, partners- 86,  married x 32 yrs   Lifestyle  . Physical activity:    Days per week: Not on file    Minutes per session: Not on file  . Stress: Not on file  Relationships  . Social connections:    Talks on phone: Not on file    Gets together: Not on file    Attends religious service: Not on file    Active member of club or organization: Not on file    Attends meetings of clubs or organizations: Not on file    Relationship status: Not on file  . Intimate partner violence:    Fear of current or ex partner: Not on file    Emotionally abused: Not on file    Physically abused: Not on file    Forced sexual activity: Not on file  Other Topics Concern  . Not on file  Social History Narrative  . Not on file   Current Outpatient Medications on File Prior to Visit  Medication Sig Dispense Refill  . acetaminophen (TYLENOL) 325 MG tablet Take 2 tablets (650 mg total) by mouth every 6 (six) hours as needed for moderate pain or headache.    . albuterol (PROAIR HFA) 108 (90  Base) MCG/ACT inhaler Inhale 2 puffs into the lungs every 4 (four) hours as needed for wheezing. 1 Inhaler 5  . aspirin 81 MG tablet Take 81 mg by mouth daily.    . Coenzyme Q10 (CO Q 10 PO) Take 1 capsule by mouth daily.    Marland Kitchen diltiazem (CARTIA XT) 120 MG 24 hr capsule Take 1 capsule (120 mg total) by mouth daily. 90 capsule 3  . furosemide (LASIX) 20 MG tablet TAKE 1 TABLET BY MOUTH DAILY AS NEEDED (FLUID) 30 tablet 11  . metFORMIN (GLUCOPHAGE) 500 MG tablet TAKE 1 TABLET(500 MG) BY MOUTH TWICE DAILY WITH A MEAL 180 tablet 0  . methimazole (TAPAZOLE) 5 MG tablet TAKE 1/2 tab (2.5mg ) daily. 135 tablet 0  . Multiple Vitamins-Minerals (MULTIVITAMIN WITH MINERALS) tablet Take 1 tablet by mouth daily.    . multivitamin-lutein (OCUVITE-LUTEIN) CAPS capsule Take 1 capsule by mouth daily.    . Omega-3 Fatty Acids (FISH OIL PO) Take 1 capsule by mouth daily.    . TURMERIC PO Take 1 tablet by mouth daily.     No current facility-administered medications on file prior to visit.    Allergies  Allergen Reactions  . Levofloxacin In D5w Other (See Comments)    Achilles tendon weakness  . Mangifera Indica Anaphylaxis  . Mango Flavor Swelling  . Bupropion Nausea And Vomiting  . Levaquin [Levofloxacin] Other (See Comments)    Severe tendonitis Tolerates Cipro   Family History  Problem Relation Age of Onset  . Breast cancer Mother   . CAD Father        CABG  . Skin cancer Father   . Lung cancer Father   . Diverticulitis Father   . Colon polyps Father   . COPD Other   . Cancer Other   . Heart disease Other   . Stroke Other   . Allergies Brother   . Breast cancer Maternal Grandmother   . Heart attack Maternal Grandfather   . Heart attack Paternal Grandfather   . Hypertension Neg Hx    PE: BP 120/70   Pulse 88   Ht  5' (1.524 m) Comment: measured  Wt 227 lb (103 kg)   LMP 03/22/2012   SpO2 97%   BMI 44.33 kg/m   Wt Readings from Last 3 Encounters:  09/20/18 227 lb (103 kg)  08/16/18  223 lb (101.2 kg)  07/24/18 227 lb (103 kg)   Constitutional: overweight, in NAD Eyes: PERRLA, EOMI, no exophthalmos ENT: moist mucous membranes, no thyromegaly, no cervical lymphadenopathy Cardiovascular: RRR, No MRG Respiratory: CTA B Gastrointestinal: abdomen soft, NT, ND, BS+ Musculoskeletal: no deformities, strength intact in all 4 Skin: moist, warm, no rashes Neurological: no tremor with outstretched hands, DTR normal in all 4  ASSESSMENT: 1. Thyrotoxycosis, likely mild Graves' disease  07/10/2014: Uptake and scan: 24 hr uptake by the thyroid gland is 28.5%. Normal 24 hr uptake is 10-30 %. On thyroid imaging, the thyroid activity appears mildly heterogeneous in the right lobe with a possible cold nodule inferiorly. The left lobe activity appears homogeneous.  2. Multinodular goiter  07/15/2014: Thyroid U/S:  Right thyroid lobe: 4.5 x 1.9 x 1.9 cm. Multiple nodules are noted throughout the right lobe of the thyroid. There is diffuse irregular margined hypoechoic nodule measuring 16 mm in the upper pole. Multiple smaller nodules are identified.  Left thyroid lobe: 4.4 x 1.7 x 1.9 cm. Multiple hypoechoic nodules are noted within the left lobe of the thyroid. The largest of these measures 15 mm in greatest dimension lying within the lower pole  Isthmus Thickness: 0.5 cm. No nodules visualized.  Lymphadenopathy None visualized.  IMPRESSION: Multiple bilateral thyroid nodules. The dominant nodules bilateral meet criteria for percutaneous biopsy.   10/20/2017: Thyroid U/S: Location: Right; Inferior Maximum size: 1.5 cm; Other 2 dimensions: 1.1 cm x 1.3 cm Composition: solid/almost completely solid (2) Echogenicity: isoechoic (1)  IMPRESSION: Right inferior thyroid nodule (labeled 1) meets criteria for surveillance, as designated by the newly established ACR TI-RADS criteria. Surveillance ultrasound study recommended to be performed annually up to 5 years. Given that the  prior ultrasound was performed 2015, this may serve as the third annual.  3. Prediabetes   PLAN:  1. Patient with history of subclinical hyperthyroidism, initially with few possible thyrotoxic symptoms: Heat intolerance, palpitations, now only with occasional mild palpitations.  Thyroid uptake and scan showed possible mild Graves' disease versus slight thyrotoxic nodules.  Of note, her TSI's were not elevated in the past.  She continues on methimazole, started in 05/2015..  In the past, she refused to start on this and ended up developing atrial fibrillation.  No episodes since last visit.  Currently on 2.5 mg daily, decreased at last visit after TSH returned high. - At this visit, she tells me that she is occasionally increasing the dose of methimazole to 5 mg daily if she feels neck compression symptoms: Fullness in her throat and dysphagia.  She did not increase the dose in the last month. - She continues on Cardizem, changed from metoprolol due to dizziness - We will check her TFTs today.  2. Multinodular goiter - I reviewed the report of her previous thyroid ultrasounds and the uptake and scan.  At last visit, we checked a thyroid ultrasound and this showed a right inferior 1.5 cm thyroid nodule, isoechoic which qualifies for follow-up.  We discussed that we need to check an ultrasound now and another one in 1 to 2 years to document stability.  If stable, no further investigation is needed.  For now, however, I would like to obtain her thyroid test first and normalized them if  they are thyrotoxic before checking another ultrasound.  I explained that in the setting of thyroid toxicity, the thyroid may contain pseudo-nodules (areas of inflammation), which can confound the picture.  3. Prediabetes -At last visit, HbA1c was 5.5%, decreased.  Today, 5.7%, slightly higher. -Continues on metformin 500 mg twice a day.  Office Visit on 09/20/2018  Component Date Value Ref Range Status  . TSH  09/20/2018 2.44  0.35 - 4.50 uIU/mL Final  . Free T4 09/20/2018 0.82  0.60 - 1.60 ng/dL Final   Comment: Specimens from patients who are undergoing biotin therapy and /or ingesting biotin supplements may contain high levels of biotin.  The higher biotin concentration in these specimens interferes with this Free T4 assay.  Specimens that contain high levels  of biotin may cause false high results for this Free T4 assay.  Please interpret results in light of the total clinical presentation of the patient.    . T3, Free 09/20/2018 2.9  2.3 - 4.2 pg/mL Final  . Hemoglobin A1C 09/20/2018 5.7* 4.0 - 5.6 % Final   TFTs normal.  I will order a thyroid ultrasound.   Philemon Kingdom, MD PhD [facility]

## 2018-09-25 ENCOUNTER — Encounter: Payer: Self-pay | Admitting: Internal Medicine

## 2018-09-27 ENCOUNTER — Telehealth: Payer: BLUE CROSS/BLUE SHIELD | Admitting: Family

## 2018-09-27 DIAGNOSIS — B9789 Other viral agents as the cause of diseases classified elsewhere: Secondary | ICD-10-CM

## 2018-09-27 DIAGNOSIS — J069 Acute upper respiratory infection, unspecified: Secondary | ICD-10-CM

## 2018-09-27 MED ORDER — FLUTICASONE PROPIONATE 50 MCG/ACT NA SUSP: 2.0000 | Freq: Every day | NASAL | 6 refills | Status: DC

## 2018-09-27 MED ORDER — AZITHROMYCIN 250 MG PO TABS: ORAL_TABLET | ORAL | 0 refills | Status: DC

## 2018-09-27 MED ORDER — BENZONATATE 100 MG PO CAPS: 100.0000 mg | ORAL_CAPSULE | Freq: Three times a day (TID) | ORAL | 0 refills | Status: DC | PRN

## 2018-09-27 NOTE — Addendum Note (Signed)
Addended by: Evelina Dun A on: 09/27/2018 10:18 AM   Modules accepted: Orders

## 2018-09-27 NOTE — Progress Notes (Signed)
We are sorry you are not feeling well.  Here is how we plan to help!  Based on what you have shared with me, it looks like you may have a viral upper respiratory infection or a "common cold".  Colds are caused by a large number of viruses; however, rhinovirus is the most common cause.   Symptoms of the common cold vary from person to person, with common symptoms including sore throat, cough, and malaise.  A low-grade fever of 100.4 may present, but is often uncommon.  Symptoms vary however, and are closely related to a person's age or underlying illnesses.  The most common symptoms associated with the common cold are nasal discharge or congestion, cough, sneezing, headache and pressure in the ears and face.  Cold symptoms usually persist for about 3 to 10 days, but can last up to 2 weeks.  It is important to know that colds do not cause serious illness or complications in most cases.    The common cold is transmitted from person to person, with the most common method of transmission being a person's hands.  The virus is able to live on the skin and can infect other persons for up to 2 hours after direct contact.  Also, colds are transmitted when someone coughs or sneezes; thus, it is important to cover the mouth to reduce this risk.  To keep the spread of the common cold at [place], good hand hygiene is very important.  This is an infection that is most likely caused by a virus. There are no specific treatments for the common cold other than to help you with the symptoms until the infection runs its course.    For nasal congestion, you may use an oral decongestants such as Mucinex D or if you have glaucoma or high blood pressure use plain Mucinex.  Saline nasal spray or nasal drops can help and can safely be used as often as needed for congestion.  For your congestion, I have prescribed Fluticasone nasal spray one spray in each nostril twice a day  If you do not have a history of heart disease, hypertension,  diabetes or thyroid disease, prostate/bladder issues or glaucoma, you may also use Sudafed to treat nasal congestion.  It is highly recommended that you consult with a pharmacist or your primary care physician to ensure this medication is safe for you to take.     If you have a cough, you may use cough suppressants such as Delsym and Robitussin.  If you have glaucoma or high blood pressure, you can also use Coricidin HBP.   For cough I have prescribed for you A prescription cough medication called Tessalon Perles 100 mg. You may take 1-2 capsules every 8 hours as needed for cough  If you have a sore or scratchy throat, use a saltwater gargle-  to  teaspoon of salt dissolved in a 4-ounce to 8-ounce glass of warm water.  Gargle the solution for approximately 15-30 seconds and then spit.  It is important not to swallow the solution.  You can also use throat lozenges/cough drops and Chloraseptic spray to help with throat pain or discomfort.  Warm or cold liquids can also be helpful in relieving throat pain.  For headache, pain or general discomfort, you can use Ibuprofen or Tylenol as directed.   Some authorities believe that zinc sprays or the use of Echinacea may shorten the course of your symptoms.   HOME CARE . Only take medications as instructed by your  medical team. . Be sure to drink plenty of fluids. Water is fine as well as fruit juices, sodas and electrolyte beverages. You may want to stay away from caffeine or alcohol. If you are nauseated, try taking small sips of liquids. How do you know if you are getting enough fluid? Your urine should be a pale yellow or almost colorless. . Get rest. . Taking a steamy shower or using a humidifier may help nasal congestion and ease sore throat pain. You can place a towel over your head and breathe in the steam from hot water coming from a faucet. . Using a saline nasal spray works much the same way. . Cough drops, hard candies and sore throat lozenges  may ease your cough. . Avoid close contacts especially the very young and the elderly . Cover your mouth if you cough or sneeze . Always remember to wash your hands.   GET HELP RIGHT AWAY IF: . You develop worsening fever. . If your symptoms do not improve within 10 days . You develop yellow or green discharge from your nose over 3 days. . You have coughing fits . You develop a severe head ache or visual changes. . You develop shortness of breath, difficulty breathing or start having chest pain . Your symptoms persist after you have completed your treatment plan  MAKE SURE YOU   Understand these instructions.  Will watch your condition.  Will get help right away if you are not doing well or get worse.  Your e-visit answers were reviewed by a board certified advanced clinical practitioner to complete your personal care plan. Depending upon the condition, your plan could have included both over the counter or prescription medications. Please review your pharmacy choice. If there is a problem, you may call our nursing hot line at and have the prescription routed to another pharmacy. Your safety is important to [name]. If you have drug allergies check your prescription carefully.   You can use MyChart to ask questions about today's visit, request a non-urgent call back, or ask for a work or school excuse for 24 hours related to this e-Visit. If it has been greater than 24 hours you will need to follow up with your provider, or enter a new e-Visit to address those concerns. You will get an e-mail in the next two days asking about your experience.  I hope that your e-visit has been valuable and will speed your recovery. Thank you for using e-visits.

## 2018-10-23 ENCOUNTER — Ambulatory Visit
Admission: RE | Admit: 2018-10-23 | Discharge: 2018-10-23 | Disposition: A | Discharge status: Home / Self Care | Payer: BLUE CROSS/BLUE SHIELD | Source: Ambulatory Visit | Attending: Internal Medicine | Admitting: Internal Medicine

## 2018-10-23 DIAGNOSIS — E042 Nontoxic multinodular goiter: Secondary | ICD-10-CM | POA: Diagnosis not present

## 2018-11-08 ENCOUNTER — Encounter: Payer: Self-pay | Admitting: Internal Medicine

## 2018-11-19 ENCOUNTER — Telehealth: Payer: BC Managed Care – PPO | Admitting: Physician Assistant

## 2018-11-19 DIAGNOSIS — B9689 Other specified bacterial agents as the cause of diseases classified elsewhere: Secondary | ICD-10-CM

## 2018-11-19 DIAGNOSIS — J019 Acute sinusitis, unspecified: Secondary | ICD-10-CM | POA: Diagnosis not present

## 2018-11-19 MED ORDER — AMOXICILLIN-POT CLAVULANATE 875-125 MG PO TABS: 1.0000 | ORAL_TABLET | Freq: Two times a day (BID) | ORAL | 0 refills | Status: DC

## 2018-11-19 NOTE — Progress Notes (Signed)

## 2018-12-06 DIAGNOSIS — H43811 Vitreous degeneration, right eye: Secondary | ICD-10-CM | POA: Diagnosis not present

## 2018-12-06 DIAGNOSIS — H5310 Unspecified subjective visual disturbances: Secondary | ICD-10-CM | POA: Diagnosis not present

## 2018-12-06 DIAGNOSIS — H04121 Dry eye syndrome of right lacrimal gland: Secondary | ICD-10-CM | POA: Diagnosis not present

## 2018-12-15 ENCOUNTER — Encounter: Payer: Self-pay | Admitting: Internal Medicine

## 2018-12-15 DIAGNOSIS — H43811 Vitreous degeneration, right eye: Secondary | ICD-10-CM | POA: Diagnosis not present

## 2018-12-15 DIAGNOSIS — H2511 Age-related nuclear cataract, right eye: Secondary | ICD-10-CM | POA: Diagnosis not present

## 2018-12-15 DIAGNOSIS — H52203 Unspecified astigmatism, bilateral: Secondary | ICD-10-CM | POA: Diagnosis not present

## 2018-12-15 LAB — HM DIABETES EYE EXAM

## 2018-12-23 ENCOUNTER — Other Ambulatory Visit: Payer: Self-pay | Admitting: Family Medicine

## 2018-12-24 ENCOUNTER — Other Ambulatory Visit: Payer: Self-pay | Admitting: Family Medicine

## 2018-12-27 NOTE — Telephone Encounter (Signed)
Dr. Sarajane Jews please advise on refills of meds.  Thanks

## 2019-01-04 ENCOUNTER — Encounter (INDEPENDENT_AMBULATORY_CARE_PROVIDER_SITE_OTHER): Payer: Self-pay

## 2019-01-04 ENCOUNTER — Encounter (INDEPENDENT_AMBULATORY_CARE_PROVIDER_SITE_OTHER): Payer: BLUE CROSS/BLUE SHIELD

## 2019-01-04 ENCOUNTER — Other Ambulatory Visit: Payer: Self-pay

## 2019-01-11 ENCOUNTER — Encounter (INDEPENDENT_AMBULATORY_CARE_PROVIDER_SITE_OTHER): Payer: Self-pay | Admitting: Bariatrics

## 2019-01-11 ENCOUNTER — Other Ambulatory Visit: Payer: Self-pay

## 2019-01-11 ENCOUNTER — Ambulatory Visit (INDEPENDENT_AMBULATORY_CARE_PROVIDER_SITE_OTHER): Payer: BLUE CROSS/BLUE SHIELD | Admitting: Bariatrics

## 2019-01-11 VITALS — BP 129/75 | HR 66 | Temp 97.6°F | Ht 60.0 in | Wt 227.0 lb

## 2019-01-11 DIAGNOSIS — F3289 Other specified depressive episodes: Secondary | ICD-10-CM

## 2019-01-11 DIAGNOSIS — Z9189 Other specified personal risk factors, not elsewhere classified: Secondary | ICD-10-CM | POA: Diagnosis not present

## 2019-01-11 DIAGNOSIS — G4733 Obstructive sleep apnea (adult) (pediatric): Secondary | ICD-10-CM

## 2019-01-11 DIAGNOSIS — Z0289 Encounter for other administrative examinations: Secondary | ICD-10-CM

## 2019-01-11 DIAGNOSIS — R5383 Other fatigue: Secondary | ICD-10-CM | POA: Diagnosis not present

## 2019-01-11 DIAGNOSIS — R7303 Prediabetes: Secondary | ICD-10-CM

## 2019-01-11 DIAGNOSIS — E559 Vitamin D deficiency, unspecified: Secondary | ICD-10-CM | POA: Diagnosis not present

## 2019-01-11 DIAGNOSIS — R0602 Shortness of breath: Secondary | ICD-10-CM | POA: Diagnosis not present

## 2019-01-11 DIAGNOSIS — Z6841 Body Mass Index (BMI) 40.0 and over, adult: Secondary | ICD-10-CM

## 2019-01-11 DIAGNOSIS — Z720 Tobacco use: Secondary | ICD-10-CM

## 2019-01-11 DIAGNOSIS — E0580 Other thyrotoxicosis without thyrotoxic crisis or storm: Secondary | ICD-10-CM

## 2019-01-11 DIAGNOSIS — E7849 Other hyperlipidemia: Secondary | ICD-10-CM

## 2019-01-11 DIAGNOSIS — K219 Gastro-esophageal reflux disease without esophagitis: Secondary | ICD-10-CM

## 2019-01-11 NOTE — Progress Notes (Signed)
Office: [phone]  /  Fax: [phone]   Dear Dr. Sarajane Best,   Thank you for referring Alison Best to our clinic. The following note includes my evaluation and treatment recommendations.  HPI:   Chief Complaint: OBESITY    Alison Best has been referred by Alison Best. Alison Jews, MD for consultation regarding her obesity and obesity related comorbidities.    Alison Best (MR# [id number]) is a 63 y.o. female who presents on 01/11/2019 for obesity evaluation and treatment. Current BMI is Body mass index is 44.33 kg/m.Alison Best has been struggling with her weight for many years and has been unsuccessful in either losing weight, maintaining weight loss, or reaching her healthy weight goal. Alison Best states that she needs to lose weight, so that she can get hernia surgery (incisional hernia).      Alison Best attended our information session and states she is currently in the action stage of change and ready to dedicate time achieving and maintaining a healthier weight. Alison Best is interested in becoming our patient and working on intensive lifestyle modifications including (but not limited to) diet, exercise and weight loss.    Alison Best states her family eats meals together she thinks her family will eat healthier with  her her desired weight loss is 67 lbs. she started gaining weight in 1990 post childbirth her heaviest weight ever was 235 lbs. she eats out 7 to 8 times a week she has significant food cravings issues  she snacks frequently in the evenings she skips breakfast she frequently makes poor food choices she frequently eats larger portions than normal  she has binge eating behaviors she struggles with emotional eating    Fatigue Alison Best feels her energy is lower than it should be. This has worsened with weight gain and has not worsened recently. Alison Best admits to daytime somnolence. Patient has a diagnosis of obstructive sleep apnea, which may contribute to her fatigue. Patent  has a history of symptoms of daytime fatigue. Patient generally gets 7 or 8 hours of sleep per night, and states they generally have restful sleep. Snoring is present. Apneic episodes are present. Epworth Sleepiness Score is 7  Dyspnea on exertion Alison Best notes increasing shortness of breath with certain activities and seems to be worsening over time with weight gain. She notes getting out of breath sooner with activity than she used to. This has not gotten worse recently. Alison Best denies orthopnea.  Pre-Diabetes Alison Best has a diagnosis of prediabetes based on her elevated Hgb A1c and was informed this puts her at greater risk of developing diabetes. Her last A1c was at 5.7 (09/20/18). She is taking metformin without side effects. Alison Best is attempting to work on diet and exercise to decrease risk of diabetes. She denies nausea or hypoglycemia.  At risk for diabetes Alison Best is at higher than average risk for developing diabetes due to her obesity and prediabetes. She currently denies polyuria or polydipsia.  OSA (obstructive sleep apnea) Alison Best was diagnosed with mild to moderate sleep apnea in 2005. She had tried CPAP, but she had side effects.  GERD (gastroesophageal reflux disease) Alison Best has a diagnosis of gastroesophageal reflux disease. She has swallowing difficulties. Alison Best has a history of diverticulitis and IBS (irritable bowel syndrome). She has a history of colon resection and colostomy.  Hyperlipidemia Alison Best has hyperlipidemia and she is not on medications currently. She has elevated triglycerides. Alison Best is attempting to improve her cholesterol levels with intensive lifestyle modification including a low saturated fat diet, exercise and weight loss. She  denies myalgias.  Subclinical Thyrotoxicosis Alison Best had testing. She has thyroid nodules and she is being followed. Alison Best is currently taking Tapazole.  Vitamin D deficiency Alison Best has a diagnosis of vitamin D deficiency. She is not currently taking vit D and  denies nausea, vomiting or muscle weakness.  Nicotine Abuse Alison Best has a diagnosis of nicotine abuse. She smokes 5 to 7 cigarettes a day. She started college and quit for one year and then re-started.  Depression with emotional eating behaviors Alison Best is struggling with emotional eating and using food for comfort to the extent that it is negatively impacting her health. She often snacks when she is not hungry. Alison Best sometimes feels she is out of control and then feels guilty that she made poor food choices. She is attempting to work on behavior modification techniques to help reduce her emotional eating. She shows no sign of suicidal or homicidal ideations.  Depression Screen Alison Best's Food and Mood (modified PHQ-9) score was  Depression screen PHQ 2/9 01/11/2019  Decreased Interest 2  Down, Depressed, Hopeless 1  PHQ - 2 Score 3  Altered sleeping 2  Tired, decreased energy 2  Change in appetite 1  Feeling bad or failure about yourself  1  Trouble concentrating 0  Moving slowly or fidgety/restless 2  Suicidal thoughts 0  PHQ-9 Score 11  Difficult doing work/chores Somewhat difficult    ASSESSMENT AND PLAN:  Other fatigue - Plan: EKG 12-Lead  Shortness of breath on exertion  Prediabetes - Plan: Comprehensive metabolic panel, Hemoglobin A1c, Insulin, random  OSA (obstructive sleep apnea)  Gastroesophageal reflux disease, esophagitis presence not specified  Other hyperlipidemia  Other thyrotoxicosis without thyrotoxic crisis or storm  Vitamin D deficiency - Plan: VITAMIN D 25 Hydroxy (Vit-D Deficiency, Fractures)  Nicotine abuse  Other depression - with emotional eating  At risk for diabetes mellitus  Class 3 severe obesity with serious comorbidity and body mass index (BMI) of 40.0 to 44.9 in adult, unspecified obesity type ([place])  PLAN:  Fatigue Alison Best was informed that her fatigue may be related to obesity, depression or many other causes. Labs will be ordered, and in the  meanwhile Alison Best has agreed to work on diet, exercise and weight loss to help with fatigue. Proper sleep hygiene was discussed including the need for 7-8 hours of quality sleep each night.  Dyspnea on exertion Zamara's shortness of breath appears to be obesity related and exercise induced. She has agreed to work on weight loss and gradually increase exercise to treat her exercise induced shortness of breath. If Tulani follows our instructions and loses weight without improvement of her shortness of breath, we will plan to refer to pulmonology. We will monitor this condition regularly. Caliope agrees to this plan.  Pre-Diabetes Arieonna will continue to work on weight loss, exercise, and decreasing simple carbohydrates in her diet to help decrease the risk of diabetes. We dicussed metformin including benefits and risks. She was informed that eating too many simple carbohydrates or too many calories at one sitting increases the likelihood of GI side effects. Bernardina will continue metformin and follow up with [name] as directed to monitor her progress.  Diabetes risk counseling Verma was given extended (15 minutes) diabetes prevention counseling today. She is 63 y.o. female and has risk factors for diabetes including obesity and prediabetes. We discussed intensive lifestyle modifications today with an emphasis on weight loss as well as increasing exercise and decreasing simple carbohydrates in her diet.  OSA (obstructive sleep apnea) Tawania will consider  having another sleep study. Myleka agrees to follow up with our clinic in 2 weeks.  GERD (gastroesophageal reflux disease) We discussed how gastroesophageal reflux disease can improve with weight loss. Delva will start to work on weight loss and will follow up with our clinic in 2 weeks.  Hyperlipidemia Marwah was informed of the American Heart Association Guidelines emphasizing intensive lifestyle modifications as the first line treatment for hyperlipidemia. We discussed many  lifestyle modifications today in depth, and Roanna will start to work on decreasing simple carbohydrates, saturated fats such as fatty red meat, butter and many fried foods. She will also increase vegetables and lean protein in her diet and start to work on exercise and weight loss efforts. We will check lipids today and Nadeen will follow up at the agreed upon time.  Subclinical Thyrotoxicosis Evelyne will follow up with the endocrinologist. She will follow up with our clinic in 2 weeks.  Vitamin D Deficiency Raelynne was informed that low vitamin D levels contributes to fatigue and are associated with obesity, breast, and colon cancer. We will check vitamin D level today and she will follow up for routine testing of vitamin D, at least 2-3 times per year. Margarett agrees to follow up with our clinic in 2 weeks.  Nicotine Abuse We discussed smoking cessation today. Eileen will follow up with our clinic in 2 weeks.  Depression with Emotional Eating Behaviors We discussed behavior modification techniques today to help Giorgia deal with her emotional eating and depression. We will refer Derricka to Dr. Mallie Mussel our bariatric psychologist.  Depression Screen Nikyla had a moderately positive depression screening. Depression is commonly associated with obesity and often results in emotional eating behaviors. We will monitor this closely and work on CBT to help improve the non-hunger eating patterns.  Obesity Tequilla is currently in the action stage of change and her goal is to continue with weight loss efforts. I recommend Naomi begin the structured treatment plan as follows:  She has agreed to follow the category 2 plan  Cosette has been instructed to eventually work up to a goal of 150 minutes of combined cardio and strengthening exercise per week for weight loss and overall health benefits. We discussed the following Behavioral Modification Strategies today: increase H2O intake, no skipping meals, keeping healthy foods in the  home, ways to avoid boredom eating, better snacking choices, increasing lean protein intake, decreasing simple carbohydrates, increasing vegetables, decrease eating out, work on meal planning and easy cooking plans and emotional eating strategies Lanora will need to lose weight for surgery, and she will need at least to be below a BMI of 40 or below.   She was informed of the importance of frequent follow up visits to maximize her success with intensive lifestyle modifications for her multiple health conditions. She was informed we would discuss her lab results at her next visit unless there is a critical issue that needs to be addressed sooner. Litzi agreed to keep her next visit at the agreed upon time to discuss these results.  ALLERGIES: Allergies  Allergen Reactions   Levofloxacin In D5w Other (See Comments)    Achilles tendon weakness   Mangifera Indica Anaphylaxis   Mango Flavor Swelling   Bupropion Nausea And Vomiting   Levaquin [Levofloxacin] Other (See Comments)    Severe tendonitis Tolerates Cipro    MEDICATIONS: Current Outpatient Medications on File Prior to Visit  Medication Sig Dispense Refill   acetaminophen (TYLENOL) 325 MG tablet Take 2 tablets (650 mg total)  by mouth every 6 (six) hours as needed for moderate pain or headache.     albuterol (PROAIR HFA) 108 (90 Base) MCG/ACT inhaler Inhale 2 puffs into the lungs every 4 (four) hours as needed for wheezing. 1 Inhaler 5   amoxicillin-clavulanate (AUGMENTIN) 875-125 MG tablet Take 1 tablet by mouth 2 (two) times daily. 14 tablet 0   aspirin 81 MG tablet Take 81 mg by mouth daily.     benzonatate (TESSALON PERLES) 100 MG capsule Take 1 capsule (100 mg total) by mouth 3 (three) times daily as needed. 20 capsule 0   Coenzyme Q10 (CO Q 10 PO) Take 1 capsule by mouth daily.     diclofenac (VOLTAREN) 75 MG EC tablet TAKE 1 TABLET(75 MG) BY MOUTH TWICE DAILY 60 tablet 11   diltiazem (CARTIA XT) 120 MG 24 hr capsule  Take 1 capsule (120 mg total) by mouth daily. 90 capsule 3   fluticasone (FLONASE) 50 MCG/ACT nasal spray Place 2 sprays into both nostrils daily. 16 g 6   furosemide (LASIX) 20 MG tablet TAKE 1 TABLET BY MOUTH DAILY AS NEEDED FOR FLUID 30 tablet 0   metFORMIN (GLUCOPHAGE) 500 MG tablet TAKE 1 TABLET(500 MG) BY MOUTH TWICE DAILY WITH A MEAL 180 tablet 3   methimazole (TAPAZOLE) 5 MG tablet TAKE 1/2 tab (2.5mg ) daily. 135 tablet 0   metroNIDAZOLE (METROGEL) 1 % gel APPLY EXTERNALLY TO THE AFFECTED AREA DAILY 60 g 11   Multiple Vitamins-Minerals (MULTIVITAMIN WITH MINERALS) tablet Take 1 tablet by mouth daily.     multivitamin-lutein (OCUVITE-LUTEIN) CAPS capsule Take 1 capsule by mouth daily.     Omega-3 Fatty Acids (FISH OIL PO) Take 1 capsule by mouth daily.     TURMERIC PO Take 1 tablet by mouth daily.     No current facility-administered medications on file prior to visit.     PAST MEDICAL HISTORY: Past Medical History:  Diagnosis Date   Anal fissure    Arthritis    Bursitis    Constipation    COPD (chronic obstructive pulmonary disease) (HCC)    Diabetes mellitus without complication ([place])    sees Dr. Cruzita Lederer    Diverticulitis of colon 02/2015   Ectopic pregnancy    Emphysema of lung ([place])    Frozen shoulder    Heart murmur    Hyperlipidemia    Hyperthyroidism    Infertility, female    Irregular heartbeat    Joint pain    Obesity    Osteoarthritis    Sleep apnea    Swallowing difficulty    Thyroid disease    hyperthyroid    PAST SURGICAL HISTORY: Past Surgical History:  Procedure Laterality Date   APPENDECTOMY     CESAREAN SECTION     COLONOSCOPY  09-10-10   per Dr. Acquanetta Sit, diverticulosis of descending colon and sigmoid, internal hemorrhoids, repeat in 10 yrs   COLOSTOMY N/A 02/20/2015   Procedure: COLOSTOMY;  Surgeon: Jackolyn Confer, MD;  Location: [facility];  Service: General;  Laterality: N/A;   COLOSTOMY REVISION  02/20/2015    Procedure: Sigmoid Colectomy;  Surgeon: Jackolyn Confer, MD;  Location: [facility];  Service: General;;   CYSTOSCOPY N/A 02/20/2015   Procedure: Consuela Mimes;  Surgeon: Kathie Rhodes, MD;  Location: [facility];  Service: Urology;  Laterality: N/A;   DILATION AND CURETTAGE OF UTERUS     x3   ILEO LOOP COLOSTOMY CLOSURE N/A 09/26/2015   Procedure: LAPAROSCOPIC LYSIS OF ADHESIONS, COLOSTOMY CLOSURE;  Surgeon: Jackolyn Confer,  MD;  Location: [facility];  Service: General;  Laterality: N/A;   LAPAROTOMY N/A 02/20/2015   Procedure: Emergency EXPLORATORY and drainiage of interabdominal abcesses;  Surgeon: Jackolyn Confer, MD;  Location: [facility];  Service: General;  Laterality: N/A;   SALPINGOOPHORECTOMY Left 02/20/2015   Procedure: SALPINGO OOPHORECTOMY;  Surgeon: Jackolyn Confer, MD;  Location: [facility];  Service: General;  Laterality: Left;   TONSILLECTOMY  1980    SOCIAL HISTORY: Social History   Tobacco Use   Smoking status: Current Every Day Smoker    Packs/day: 0.25    Years: 40.00    Pack years: 10.00    Types: Cigarettes   Smokeless tobacco: Never Used   Tobacco comment: less than 1/2 pack a day  Substance Use Topics   Alcohol use: Yes    Alcohol/week: 0.0 standard drinks    Comment: occ   Drug use: No    FAMILY HISTORY: Family History  Problem Relation Age of Onset   Breast cancer Mother    High blood pressure Mother    Kidney disease Mother    Sleep apnea Mother    Obesity Mother    CAD Father        CABG   Skin cancer Father    Lung cancer Father    Diverticulitis Father    Colon polyps Father    High blood pressure Father    Stroke Father    Heart disease Father    Sleep apnea Father    COPD Other    Cancer Other    Heart disease Other    Stroke Other    Allergies Brother    Breast cancer Maternal Grandmother    Heart attack Maternal Grandfather    Heart attack Paternal Grandfather    Hypertension Neg Hx     ROS: Review of Systems    Constitutional: Positive for malaise/fatigue.  HENT: Positive for sinus pain.        + Nasal Discharge  Eyes:       + Vision Changes + Wear Glasses or Contacts + Flashes of Light + Floaters  Respiratory: Positive for shortness of breath (with activity).        + Painful or Difficulty Breathing  Cardiovascular: Negative for orthopnea.       + Leg Cramping  Gastrointestinal: Positive for constipation. Negative for diarrhea, nausea and vomiting.  Genitourinary: Negative for frequency.  Musculoskeletal: Negative for myalgias.       + Muscle or Joint Pain Negative for muscle weakness  Endo/Heme/Allergies: Negative for polydipsia.       Negative for hypoglycemia  Psychiatric/Behavioral: Positive for depression. Negative for suicidal ideas. The patient has insomnia.        + Stress    PHYSICAL EXAM: Blood pressure 129/75, pulse 66, temperature 97.6 F (36.4 C), temperature source Oral, height 5' (1.524 m), weight 227 lb (103 kg), last menstrual period 03/22/2012, SpO2 98 %. Body mass index is 44.33 kg/m. Physical Exam Vitals signs reviewed.  Constitutional:      Appearance: Normal appearance. She is well-developed. She is obese.  HENT:     Head: Normocephalic and atraumatic.     Nose: Nose normal.  Eyes:     General: No scleral icterus.    Extraocular Movements: Extraocular movements intact.  Neck:     Musculoskeletal: Normal range of motion and neck supple.     Thyroid: No thyromegaly.  Cardiovascular:     Rate and Rhythm: Normal rate and regular rhythm.  Pulmonary:  Effort: Pulmonary effort is normal. No respiratory distress.  Abdominal:     Palpations: Abdomen is soft.     Tenderness: There is no abdominal tenderness.  Musculoskeletal: Normal range of motion.     Comments: Range of Motion normal in all 4 extremities  Skin:    General: Skin is warm and dry.  Neurological:     Mental Status: She is alert and oriented to person, place, and time.     Coordination:  Coordination normal.     Comments: Uses a cane for ambulation  Psychiatric:        Mood and Affect: Mood normal.        Behavior: Behavior normal.        Thought Content: Thought content does not include homicidal or suicidal ideation.     RECENT LABS AND TESTS: BMET    Component Value Date/Time   NA 139 11/16/2017 1655   K 4.0 11/16/2017 1655   CL 103 11/16/2017 1655   CO2 27 11/16/2017 1655   GLUCOSE 89 11/16/2017 1655   BUN 12 11/16/2017 1655   CREATININE 0.71 11/16/2017 1655   CREATININE 0.68 02/26/2016 1654   CALCIUM 9.3 11/16/2017 1655   GFRNONAA >89 02/26/2016 1654   GFRAA >89 02/26/2016 1654   Lab Results  Component Value Date   HGBA1C 5.7 (A) 09/20/2018   No results found for: INSULIN CBC    Component Value Date/Time   WBC 9.5 11/16/2017 1655   RBC 4.69 11/16/2017 1655   HGB 14.6 11/16/2017 1655   HCT 43.4 11/16/2017 1655   PLT 288.0 11/16/2017 1655   MCV 92.5 11/16/2017 1655   MCH 30.1 10/07/2015 0546   MCHC 33.5 11/16/2017 1655   RDW 15.3 11/16/2017 1655   LYMPHSABS 3.1 11/16/2017 1655   MONOABS 0.6 11/16/2017 1655   EOSABS 0.1 11/16/2017 1655   BASOSABS 0.1 11/16/2017 1655   Iron/TIBC/Ferritin/ %Sat No results found for: IRON, TIBC, FERRITIN, IRONPCTSAT Lipid Panel     Component Value Date/Time   CHOL 270 (H) 11/16/2017 1655   TRIG (H) 11/16/2017 1655    462.0 Triglyceride is over 400; calculations on Lipids are invalid.   HDL 22.20 (L) 11/16/2017 1655   CHOLHDL 12 11/16/2017 1655   VLDL 56.8 (H) 05/23/2014 0933   LDLCALC 93 11/02/2011 0947   LDLDIRECT 76.0 11/16/2017 1655   Hepatic Function Panel     Component Value Date/Time   PROT 6.8 11/16/2017 1655   ALBUMIN 4.3 11/16/2017 1655   AST 22 11/16/2017 1655   ALT 35 11/16/2017 1655   ALKPHOS 72 11/16/2017 1655   BILITOT 0.5 11/16/2017 1655   BILIDIR 0.1 11/16/2017 1655   IBILI 0.6 02/25/2015 0928      Component Value Date/Time   TSH 2.44 09/20/2018 1118   TSH 4.06 11/09/2017 1557    TSH 10.28 (H) 09/30/2017 1636    ECG  shows NSR with a rate of 67 BPM INDIRECT CALORIMETER done today shows a VO2 of 249 and a REE of 1734.  Her calculated basal metabolic rate is 3785 thus her basal metabolic rate is better than expected.   OBESITY BEHAVIORAL INTERVENTION VISIT  Today's visit was # 1   Starting weight: 227 lbs Starting date: 01/11/2019 Today's weight : 227 lbs Today's date: 01/11/2019 Total lbs lost to date: 0    01/11/2019  Height 5' (1.524 m)  Weight 227 lb (103 kg)  BMI (Calculated) 44.33  BLOOD PRESSURE - SYSTOLIC 885  BLOOD PRESSURE - DIASTOLIC 75  Waist Measurement  49 inches   Body Fat % 50 %  Total Body Water (lbs) 73.6 lbs  RMR 1734    ASK: We discussed the diagnosis of obesity with Dollene Cleveland Goldinger today and Carys agreed to give [name] permission to discuss obesity behavioral modification therapy today.  ASSESS: Tonetta has the diagnosis of obesity and her BMI today is 44.33 Ahnesti is in the action stage of change   ADVISE: Ricki was educated on the multiple health risks of obesity as well as the benefit of weight loss to improve her health. She was advised of the need for long term treatment and the importance of lifestyle modifications to improve her current health and to decrease her risk of future health problems.  AGREE: Multiple dietary modification options and treatment options were discussed and  Kennette agreed to follow the recommendations documented in the above note.  ARRANGE: Louanne was educated on the importance of frequent visits to treat obesity as outlined per CMS and USPSTF guidelines and agreed to schedule her next follow up appointment today.  Corey Skains, am acting as Location manager for [employer]. Owens Shark, DO  I have reviewed the above documentation for accuracy and completeness, and I agree with the above. -Jearld Lesch, DO

## 2019-01-12 LAB — COMPREHENSIVE METABOLIC PANEL
ALBUMIN: 4.6 g/dL (ref 3.8–4.8)
ALT: 25 IU/L (ref 0–32)
AST: 23 IU/L (ref 0–40)
Albumin/Globulin Ratio: 1.9 (ref 1.2–2.2)
Alkaline Phosphatase: 84 IU/L (ref 39–117)
BUN/Creatinine Ratio: 13 (ref 12–28)
BUN: 9 mg/dL (ref 8–27)
Bilirubin Total: 0.3 mg/dL (ref 0.0–1.2)
CO2: 22 mmol/L (ref 20–29)
Calcium: 9.5 mg/dL (ref 8.7–10.3)
Chloride: 102 mmol/L (ref 96–106)
Creatinine, Ser: 0.68 mg/dL (ref 0.57–1.00)
GFR calc Af Amer: 109 mL/min/{1.73_m2} (ref >59)
GFR calc non Af Amer: 95 mL/min/{1.73_m2} (ref >59)
GLOBULIN, TOTAL: 2.4 g/dL (ref 1.5–4.5)
Glucose: 87 mg/dL (ref 65–99)
Potassium: 4.4 mmol/L (ref 3.5–5.2)
Sodium: 142 mmol/L (ref 134–144)
Total Protein: 7 g/dL (ref 6.0–8.5)

## 2019-01-12 LAB — INSULIN, RANDOM: INSULIN: 21.2 u[IU]/mL (ref 2.6–24.9)

## 2019-01-12 LAB — VITAMIN D 25 HYDROXY (VIT D DEFICIENCY, FRACTURES): Vit D, 25-Hydroxy: 27.3 ng/mL — ABNORMAL LOW (ref 30.0–100.0)

## 2019-01-13 ENCOUNTER — Other Ambulatory Visit: Payer: Self-pay | Admitting: Family Medicine

## 2019-01-15 ENCOUNTER — Encounter (INDEPENDENT_AMBULATORY_CARE_PROVIDER_SITE_OTHER): Payer: Self-pay | Admitting: Bariatrics

## 2019-01-15 DIAGNOSIS — Z6841 Body Mass Index (BMI) 40.0 and over, adult: Secondary | ICD-10-CM | POA: Insufficient documentation

## 2019-01-16 ENCOUNTER — Encounter (INDEPENDENT_AMBULATORY_CARE_PROVIDER_SITE_OTHER): Payer: Self-pay

## 2019-01-16 NOTE — Telephone Encounter (Signed)
Please review

## 2019-01-21 ENCOUNTER — Other Ambulatory Visit: Payer: Self-pay | Admitting: Family Medicine

## 2019-01-24 ENCOUNTER — Encounter (INDEPENDENT_AMBULATORY_CARE_PROVIDER_SITE_OTHER): Payer: Self-pay

## 2019-01-25 ENCOUNTER — Ambulatory Visit (INDEPENDENT_AMBULATORY_CARE_PROVIDER_SITE_OTHER): Payer: BLUE CROSS/BLUE SHIELD | Admitting: Bariatrics

## 2019-01-25 ENCOUNTER — Other Ambulatory Visit: Payer: Self-pay

## 2019-01-25 ENCOUNTER — Encounter (INDEPENDENT_AMBULATORY_CARE_PROVIDER_SITE_OTHER): Payer: Self-pay | Admitting: Bariatrics

## 2019-01-25 DIAGNOSIS — F17200 Nicotine dependence, unspecified, uncomplicated: Secondary | ICD-10-CM | POA: Diagnosis not present

## 2019-01-25 DIAGNOSIS — F3289 Other specified depressive episodes: Secondary | ICD-10-CM | POA: Diagnosis not present

## 2019-01-25 DIAGNOSIS — R7303 Prediabetes: Secondary | ICD-10-CM

## 2019-01-25 DIAGNOSIS — E559 Vitamin D deficiency, unspecified: Secondary | ICD-10-CM | POA: Diagnosis not present

## 2019-01-25 DIAGNOSIS — Z6841 Body Mass Index (BMI) 40.0 and over, adult: Secondary | ICD-10-CM

## 2019-01-25 MED ORDER — VITAMIN D (ERGOCALCIFEROL) 1.25 MG (50000 UNIT) PO CAPS: 50000.0000 [IU] | ORAL_CAPSULE | ORAL | 0 refills | Status: DC

## 2019-01-28 ENCOUNTER — Telehealth: Payer: BLUE CROSS/BLUE SHIELD | Admitting: Physician Assistant

## 2019-01-28 DIAGNOSIS — J029 Acute pharyngitis, unspecified: Secondary | ICD-10-CM

## 2019-01-28 NOTE — Progress Notes (Signed)
E-Visit for Corona Virus Screening  Based on your current symptoms, you may very well have the virus, however your symptoms are mild. Currently, not all patients are being tested. If the symptoms are mild and there is not a known exposure, performing the test is not indicated.   At this time there is no antibody test available.  You and your husband should self isolate until 72 hours after symptoms resolution, including resolution of fever.  Coronavirus disease 2019 (COVID-19) is a respiratory illness that can spread from person to person. The virus that causes COVID-19 is a new virus that was first identified in the country of [place] but is now found in multiple other countries and has spread to the [place].  Symptoms associated with the virus are mild to severe fever, cough, and shortness of breath. There is currently no vaccine to protect against COVID-19, and there is no specific antiviral treatment for the virus.   To be considered HIGH RISK for Coronavirus (COVID-19), you have to meet the following criteria:  . Traveled to [place], [place], [place], [place] or [place]; or in the [place] to [place], [place], [place], or [place]; and have fever, cough, and shortness of breath within the last 2 weeks of travel OR  . Been in close contact with a person diagnosed with COVID-19 within the last 2 weeks and have fever, cough, and shortness of breath  . IF YOU DO NOT MEET THESE CRITERIA, YOU ARE CONSIDERED LOW RISK FOR COVID-19.   It is vitally important that if you feel that you have an infection such as this virus or any other virus that you stay home and away from places where you may spread it to others.  You should self-quarantine for 14 days if you have symptoms that could potentially be coronavirus and avoid contact with people age 64 and older.   You may also take acetaminophen (Tylenol) as needed for fever.   Reduce your risk of any infection by using the same precautions  used for avoiding the common cold or flu:  Marland Kitchen Wash your hands often with soap and warm water for at least 20 seconds.  If soap and water are not readily available, use an alcohol-based hand sanitizer with at least 60% alcohol.  . If coughing or sneezing, cover your mouth and nose by coughing or sneezing into the elbow areas of your shirt or coat, into a tissue or into your sleeve (not your hands). . Avoid shaking hands with others and consider head nods or verbal greetings only. . Avoid touching your eyes, nose, or mouth with unwashed hands.  . Avoid close contact with people who are sick. . Avoid places or events with large numbers of people in one location, like concerts or sporting events. . Carefully consider travel plans you have or are making. . If you are planning any travel outside or inside the [name], visit the CDC's Travelers' Health webpage for the latest health notices. . If you have some symptoms but not all symptoms, continue to monitor at home and seek medical attention if your symptoms worsen. . If you are having a medical emergency, call 911.  HOME CARE . Only take medications as instructed by your medical team. . Drink plenty of fluids and get plenty of rest. . A steam or ultrasonic humidifier can help if you have congestion.   GET HELP RIGHT AWAY IF: . You develop worsening fever. . You become short of breath . You  cough up blood. . Your symptoms become more severe MAKE SURE YOU   Understand these instructions.  Will watch your condition.  Will get help right away if you are not doing well or get worse.  Your e-visit answers were reviewed by a board certified advanced clinical practitioner to complete your personal care plan.  Depending on the condition, your plan could have included both over the counter or prescription medications.  If there is a problem please reply once you have received a response from your provider. Your safety is important to [name].  If you have drug  allergies check your prescription carefully.    You can use MyChart to ask questions about today's visit, request a non-urgent call back, or ask for a work or school excuse for 24 hours related to this e-Visit. If it has been greater than 24 hours you will need to follow up with your provider, or enter a new e-Visit to address those concerns. You will get an e-mail in the next two days asking about your experience.  I hope that your e-visit has been valuable and will speed your recovery. Thank you for using e-visits.

## 2019-01-29 NOTE — Progress Notes (Signed)
Office: [phone]  /  Fax: [phone] TeleHealth Visit:  Alison Best has verbally consented to this TeleHealth visit today. The patient is located at home, the provider is located at the [facility] office. The participants in this visit include the listed provider and patient and any and all parties involved. The visit was conducted today via WebEx.  HPI:   Chief Complaint: OBESITY Alison Best is here to discuss her progress with her obesity treatment plan. She is on the Category 2 plan and is following her eating plan approximately 80 to 85 % of the time. She states she is walking for 10 minutes 7 times per week. Alison Best has lost 8 pounds according to her scale. She thinks that the meal plan has given her structure. Alison Best has struggled with getting in all of her protein. She has been unable to find the bread. We were unable to weigh the patient today for this TeleHealth visit. She feels as if she has lost weight since her last visit. She has lost 8 lbs since starting treatment with [name].  Vitamin D deficiency Alison Best has a diagnosis of vitamin D deficiency. She is currently taking vit D with multi-vitamin 2,000 IU daily. Her last Vitamin D level was at 27.3. Alison Best denies nausea, vomiting or muscle weakness.  Pre-Diabetes Alison Best has a diagnosis of prediabetes based on her elevated Hgb A1c and was informed this puts her at greater risk of developing diabetes. Her last A1c was at 5.7 on 09/20/18 and her last insulin level was at 21.2 (01/11/19). She is taking metformin currently and continues to work on diet and exercise to decrease risk of diabetes. She denies nausea or hypoglycemia.  Smoker Alison Best is smoking about 10 to 11 cigarettes daily. Her goal is 5 cigarettes daily.  Depression with emotional eating behaviors Alison Best is working from home and staying busy. She struggles with emotional eating and using food for comfort to the extent that it is negatively impacting her health.  She often snacks when she is not hungry. Alison Best sometimes feels she is out of control and then feels guilty that she made poor food choices. She has been working on behavior modification techniques to help reduce her emotional eating and has been somewhat successful. She shows no sign of suicidal or homicidal ideations.  Depression screen Alison Best  Decreased Interest 2 0 0 0  Down, Depressed, Hopeless 1 0 0 0  PHQ - 2 Score 3 0 0 0  Altered sleeping 2 - - -  Tired, decreased energy 2 - - -  Change in appetite 1 - - -  Feeling bad or failure about yourself  1 - - -  Trouble concentrating 0 - - -  Moving slowly or fidgety/restless 2 - - -  Suicidal thoughts 0 - - -  PHQ-9 Score 11 - - -  Difficult doing work/chores Somewhat difficult - - -    ASSESSMENT AND PLAN:  Vitamin D deficiency - Plan: Vitamin D, Ergocalciferol, (DRISDOL) 1.25 MG (50000 UT) CAPS capsule  Prediabetes  Smoker  Other depression - with emotional eating  Class 3 severe obesity with serious comorbidity and body mass index (BMI) of 40.0 to 44.9 in adult, unspecified obesity type (HCC)  PLAN:  Vitamin D Deficiency Alison Best was informed that low vitamin D levels contributes to fatigue and are associated with obesity, breast, and colon cancer. She agrees to continue to take prescription Vit D @50 ,000 IU every week #4 with  no refills and will follow up for routine testing of vitamin D, at least 2-3 times per year. She was informed of the risk of over-replacement of vitamin D and agrees to not increase her dose unless she discusses this with [name] first. Alison Best agrees to follow up as directed.  Pre-Diabetes Alison Best will continue to work on weight loss, exercise, increasing lean protein and decreasing simple carbohydrates in her diet to help decrease the risk of diabetes. She was informed that eating too many simple carbohydrates or too many calories at one sitting increases the likelihood of GI  side effects. Alison Best agreed to follow up with [name] as directed to monitor her progress.  Smoker Alison Best will decrease smoking to 5 cigarettes daily. We discussed distraction techniques and strategies to decrease smoking.  Depression with Emotional Eating Behaviors We discussed behavior modification techniques today to help Alison Best deal with her emotional eating and depression. She will work on CBT techniques for stress and emotional eating. She will follow up as directed.  Obesity Alison Best is currently in the action stage of change. As such, her goal is to continue with weight loss efforts She has agreed to follow the Category 2 plan Alison Best has been instructed to work up to a goal of 150 minutes of combined cardio and strengthening exercise per week for weight loss and overall health benefits. We discussed the following Behavioral Modification Strategies today: increase H2O intake, no skipping meals, keeping healthy foods in the home, increasing lean protein intake, decreasing simple carbohydrates, increasing vegetables, decrease eating out and work on meal planning and easy cooking plans Alison Best will weigh herself at home. We discussed "good snacks" today.  Alison Best has agreed to follow up with our clinic in 2 weeks. She was informed of the importance of frequent follow up visits to maximize her success with intensive lifestyle modifications for her multiple health conditions.  ALLERGIES: Allergies  Allergen Reactions  . Levofloxacin In D5w Other (See Comments)    Achilles tendon weakness  . Mangifera Indica Anaphylaxis  . Mango Flavor Swelling  . Bupropion Nausea And Vomiting  . Levaquin [Levofloxacin] Other (See Comments)    Severe tendonitis Tolerates Cipro    MEDICATIONS: Current Outpatient Medications on File Prior to Visit  Medication Sig Dispense Refill  . acetaminophen (TYLENOL) 325 MG tablet Take 2 tablets (650 mg total) by mouth every 6 (six) hours as needed for moderate pain or headache.    .  albuterol (PROVENTIL HFA;VENTOLIN HFA) 108 (90 Base) MCG/ACT inhaler INHALE 2 PUFFS INTO THE LUNGS EVERY 4 HOURS AS NEEDED FOR WHEEZING 8.5 g 6  . amoxicillin-clavulanate (AUGMENTIN) 875-125 MG tablet Take 1 tablet by mouth 2 (two) times daily. 14 tablet 0  . aspirin 81 MG tablet Take 81 mg by mouth daily.    . benzonatate (TESSALON PERLES) 100 MG capsule Take 1 capsule (100 mg total) by mouth 3 (three) times daily as needed. 20 capsule 0  . Coenzyme Q10 (CO Q 10 PO) Take 1 capsule by mouth daily.    . diclofenac (VOLTAREN) 75 MG EC tablet TAKE 1 TABLET(75 MG) BY MOUTH TWICE DAILY 60 tablet 11  . diltiazem (CARTIA XT) 120 MG 24 hr capsule Take 1 capsule (120 mg total) by mouth daily. 90 capsule 3  . fluticasone (FLONASE) 50 MCG/ACT nasal spray Place 2 sprays into both nostrils daily. 16 g 6  . furosemide (LASIX) 20 MG tablet TAKE 1 TABLET BY MOUTH DAILY AS NEEDED FOR FLUID RETENTION 30 tablet 2  .  metFORMIN (GLUCOPHAGE) 500 MG tablet TAKE 1 TABLET(500 MG) BY MOUTH TWICE DAILY WITH A MEAL 180 tablet 3  . methimazole (TAPAZOLE) 5 MG tablet TAKE 1/2 tab (2.5mg ) daily. 135 tablet 0  . metroNIDAZOLE (METROGEL) 1 % gel APPLY EXTERNALLY TO THE AFFECTED AREA DAILY 60 g 11  . Multiple Vitamins-Minerals (MULTIVITAMIN WITH MINERALS) tablet Take 1 tablet by mouth daily.    . multivitamin-lutein (OCUVITE-LUTEIN) CAPS capsule Take 1 capsule by mouth daily.    . Omega-3 Fatty Acids (FISH OIL PO) Take 1 capsule by mouth daily.    . TURMERIC PO Take 1 tablet by mouth daily.     No current facility-administered medications on file prior to visit.     PAST MEDICAL HISTORY: Past Medical History:  Diagnosis Date  . Anal fissure   . Arthritis   . Bursitis   . Constipation   . COPD (chronic obstructive pulmonary disease) ([place])   . Diabetes mellitus without complication ([place])    sees Dr. Cruzita Lederer   . Diverticulitis of colon 02/2015  . Ectopic pregnancy   . Emphysema of lung ([place])   . Frozen shoulder   .  Heart murmur   . Hyperlipidemia   . Hyperthyroidism   . Infertility, female   . Irregular heartbeat   . Joint pain   . Obesity   . Osteoarthritis   . Sleep apnea   . Swallowing difficulty   . Thyroid disease    hyperthyroid    PAST SURGICAL HISTORY: Past Surgical History:  Procedure Laterality Date  . APPENDECTOMY    . CESAREAN SECTION    . COLONOSCOPY  09-10-10   per Dr. Acquanetta Sit, diverticulosis of descending colon and sigmoid, internal hemorrhoids, repeat in 10 yrs  . COLOSTOMY N/A 02/20/2015   Procedure: COLOSTOMY;  Surgeon: Jackolyn Confer, MD;  Location: [facility];  Service: General;  Laterality: N/A;  . COLOSTOMY REVISION  02/20/2015   Procedure: Sigmoid Colectomy;  Surgeon: Jackolyn Confer, MD;  Location: [facility];  Service: General;;  . CYSTOSCOPY N/A 02/20/2015   Procedure: Consuela Mimes;  Surgeon: Kathie Rhodes, MD;  Location: [facility];  Service: Urology;  Laterality: N/A;  . DILATION AND CURETTAGE OF UTERUS     x3  . ILEO LOOP COLOSTOMY CLOSURE N/A 09/26/2015   Procedure: LAPAROSCOPIC LYSIS OF ADHESIONS, COLOSTOMY CLOSURE;  Surgeon: Jackolyn Confer, MD;  Location: [facility];  Service: General;  Laterality: N/A;  . LAPAROTOMY N/A 02/20/2015   Procedure: Emergency EXPLORATORY and drainiage of interabdominal abcesses;  Surgeon: Jackolyn Confer, MD;  Location: [facility];  Service: General;  Laterality: N/A;  . SALPINGOOPHORECTOMY Left 02/20/2015   Procedure: SALPINGO OOPHORECTOMY;  Surgeon: Jackolyn Confer, MD;  Location: [facility];  Service: General;  Laterality: Left;  . TONSILLECTOMY  1980    SOCIAL HISTORY: Social History   Tobacco Use  . Smoking status: Current Every Day Smoker    Packs/day: 0.25    Years: 40.00    Pack years: 10.00    Types: Cigarettes  . Smokeless tobacco: Never Used  . Tobacco comment: less than 1/2 pack a day  Substance Use Topics  . Alcohol use: Yes    Alcohol/week: 0.0 standard drinks    Comment: occ  . Drug use: No    FAMILY HISTORY: Family History   Problem Relation Age of Onset  . Breast cancer Mother   . High blood pressure Mother   . Kidney disease Mother   . Sleep apnea Mother   . Obesity Mother   .  CAD Father        CABG  . Skin cancer Father   . Lung cancer Father   . Diverticulitis Father   . Colon polyps Father   . High blood pressure Father   . Stroke Father   . Heart disease Father   . Sleep apnea Father   . COPD Other   . Cancer Other   . Heart disease Other   . Stroke Other   . Allergies Brother   . Breast cancer Maternal Grandmother   . Heart attack Maternal Grandfather   . Heart attack Paternal Grandfather   . Hypertension Neg Hx     ROS: Review of Systems  Constitutional: Positive for weight loss.  Gastrointestinal: Negative for nausea and vomiting.  Musculoskeletal:       Negative for muscle weakness  Endo/Heme/Allergies:       Negative for hypoglycemia  Psychiatric/Behavioral: Positive for depression. Negative for suicidal ideas.    PHYSICAL EXAM: Pt in no acute distress  RECENT LABS AND TESTS: BMET    Component Value Date/Time   NA 142 01/11/2019 1136   K 4.4 01/11/2019 1136   CL 102 01/11/2019 1136   CO2 22 01/11/2019 1136   GLUCOSE 87 01/11/2019 1136   GLUCOSE 89 11/16/2017 1655   BUN 9 01/11/2019 1136   CREATININE 0.68 01/11/2019 1136   CREATININE 0.68 02/26/2016 1654   CALCIUM 9.5 01/11/2019 1136   GFRNONAA 95 01/11/2019 1136   GFRNONAA >89 02/26/2016 1654   GFRAA 109 01/11/2019 1136   GFRAA >89 02/26/2016 1654   Lab Results  Component Value Date   HGBA1C 5.7 (A) 09/20/2018   HGBA1C 5.5 09/30/2017   HGBA1C 6.3 04/01/2017   HGBA1C 6.2 10/01/2016   HGBA1C 6.0 02/26/2016   Lab Results  Component Value Date   INSULIN 21.2 01/11/2019   CBC    Component Value Date/Time   WBC 9.5 11/16/2017 1655   RBC 4.69 11/16/2017 1655   HGB 14.6 11/16/2017 1655   HCT 43.4 11/16/2017 1655   PLT 288.0 11/16/2017 1655   MCV 92.5 11/16/2017 1655   MCH 30.1 10/07/2015 0546   MCHC  33.5 11/16/2017 1655   RDW 15.3 11/16/2017 1655   LYMPHSABS 3.1 11/16/2017 1655   MONOABS 0.6 11/16/2017 1655   EOSABS 0.1 11/16/2017 1655   BASOSABS 0.1 11/16/2017 1655   Iron/TIBC/Ferritin/ %Sat No results found for: IRON, TIBC, FERRITIN, IRONPCTSAT Lipid Panel     Component Value Date/Time   CHOL 270 (H) 11/16/2017 1655   TRIG (H) 11/16/2017 1655    462.0 Triglyceride is over 400; calculations on Lipids are invalid.   HDL 22.20 (L) 11/16/2017 1655   CHOLHDL 12 11/16/2017 1655   VLDL 56.8 (H) 05/23/2014 0933   LDLCALC 93 11/02/2011 0947   LDLDIRECT 76.0 11/16/2017 1655   Hepatic Function Panel     Component Value Date/Time   PROT 7.0 01/11/2019 1136   ALBUMIN 4.6 01/11/2019 1136   AST 23 01/11/2019 1136   ALT 25 01/11/2019 1136   ALKPHOS 84 01/11/2019 1136   BILITOT 0.3 01/11/2019 1136   BILIDIR 0.1 11/16/2017 1655   IBILI 0.6 02/25/2015 0928      Component Value Date/Time   TSH 2.44 09/20/2018 1118   TSH 4.06 11/09/2017 1557   TSH 10.28 (H) 09/30/2017 1636     Ref. Range 01/11/2019 11:36  Vitamin D, 25-Hydroxy Latest Ref Range: 30.0 - 100.0 ng/mL 27.3 (L)    I, Doreene Nest, am acting as Location manager  for [employer]. Owens Shark, DO  I have reviewed the above documentation for accuracy and completeness, and I agree with the above. -Jearld Lesch, DO

## 2019-02-01 ENCOUNTER — Telehealth: Payer: BC Managed Care – PPO | Admitting: Physician Assistant

## 2019-02-01 DIAGNOSIS — B9689 Other specified bacterial agents as the cause of diseases classified elsewhere: Secondary | ICD-10-CM | POA: Diagnosis not present

## 2019-02-01 DIAGNOSIS — J329 Chronic sinusitis, unspecified: Secondary | ICD-10-CM | POA: Diagnosis not present

## 2019-02-01 MED ORDER — AMOXICILLIN-POT CLAVULANATE 875-125 MG PO TABS: 1.0000 | ORAL_TABLET | Freq: Two times a day (BID) | ORAL | 0 refills | Status: DC

## 2019-02-01 NOTE — Progress Notes (Signed)

## 2019-02-08 ENCOUNTER — Other Ambulatory Visit: Payer: Self-pay

## 2019-02-08 ENCOUNTER — Ambulatory Visit (INDEPENDENT_AMBULATORY_CARE_PROVIDER_SITE_OTHER): Payer: BLUE CROSS/BLUE SHIELD | Admitting: Bariatrics

## 2019-02-08 ENCOUNTER — Encounter (INDEPENDENT_AMBULATORY_CARE_PROVIDER_SITE_OTHER): Payer: Self-pay | Admitting: Bariatrics

## 2019-02-08 DIAGNOSIS — E559 Vitamin D deficiency, unspecified: Secondary | ICD-10-CM | POA: Diagnosis not present

## 2019-02-08 DIAGNOSIS — F17200 Nicotine dependence, unspecified, uncomplicated: Secondary | ICD-10-CM | POA: Diagnosis not present

## 2019-02-08 DIAGNOSIS — R7303 Prediabetes: Secondary | ICD-10-CM | POA: Diagnosis not present

## 2019-02-08 DIAGNOSIS — Z6841 Body Mass Index (BMI) 40.0 and over, adult: Secondary | ICD-10-CM

## 2019-02-08 DIAGNOSIS — F3289 Other specified depressive episodes: Secondary | ICD-10-CM | POA: Diagnosis not present

## 2019-02-12 NOTE — Progress Notes (Signed)
Office: [phone]  /  Fax: [phone] TeleHealth Visit:  Alison Best has verbally consented to this TeleHealth visit today. The patient is located at home, the provider is located at the [facility] office. The participants in this visit include the listed provider and patient and any and all parties involved. The visit was conducted today via WebEx.  HPI:   Chief Complaint: OBESITY Alison Best is here to discuss her progress with her obesity treatment plan. She is on the Category 2 plan and is following her eating plan approximately 60 % of the time. She states she is walking and doing cardio 5 minutes 6 times per week. Alison Best thinks that she remains at the same weight. She is doing well overall. Alison Best has done some stress eating. She is generally eating healthier. We were unable to weigh the patient today for this TeleHealth visit. She feels as if she has maintained weight since her last visit. She has lost 8 lbs since starting treatment with [name].  Pre-Diabetes Alison Best has a diagnosis of prediabetes based on her elevated Hgb A1c and was informed this puts her at greater risk of developing diabetes. Her last A1c was at 5.7 and last insulin level was at 21.2 She is taking metformin currently and continues to work on diet and exercise to decrease risk of diabetes. She denies polyphagia.  Vitamin D deficiency Alison Best has a diagnosis of vitamin D deficiency. She is taking high dose vit D and denies nausea, vomiting or muscle weakness.  Smoker Alison Best is doing better with smoking. She still has not quit smoking. We discussed at the last visit, decreasing cigarettes to 5 cigarettes per day.  Depression with emotional eating behaviors Alison Best struggles with emotional eating and using food for comfort to the extent that it is negatively impacting her health. Alison Best has done some stress eating. She often snacks when she is not hungry. Alison Best sometimes feels she is out of control and then feels  guilty that she made poor food choices. She has been working on behavior modification techniques to help reduce her emotional eating and has been somewhat successful. She shows no sign of suicidal or homicidal ideations.  Depression screen Alison Best 2/9 01/11/2019 07/26/2017 02/05/2016 06/06/2014  Decreased Interest 2 0 0 0  Down, Depressed, Hopeless 1 0 0 0  PHQ - 2 Score 3 0 0 0  Altered sleeping 2 - - -  Tired, decreased energy 2 - - -  Change in appetite 1 - - -  Feeling bad or failure about yourself  1 - - -  Trouble concentrating 0 - - -  Moving slowly or fidgety/restless 2 - - -  Suicidal thoughts 0 - - -  PHQ-9 Score 11 - - -  Difficult doing work/chores Somewhat difficult - - -     ASSESSMENT AND PLAN:  Prediabetes  Vitamin D deficiency  Smoker  Other depression - with emotional eating  Class 3 severe obesity with serious comorbidity and body mass index (BMI) of 40.0 to 44.9 in adult, unspecified obesity type ([place])  PLAN:  Pre-Diabetes [name] will continue to work on weight loss, exercise, increasing lean protein and decreasing simple carbohydrates in her diet to help decrease the risk of diabetes. We dicussed metformin including benefits and risks. She was informed that eating too many simple carbohydrates or too many calories at one sitting increases the likelihood of GI side effects. Marka agreed to follow up with [name] as directed to monitor her progress.  Vitamin D Deficiency Alison Best was informed that low vitamin D levels contributes to fatigue and are associated with obesity, breast, and colon cancer. She will continue prescription Vit D @50 ,000 IU every week and will follow up for routine testing of vitamin D, at least 2-3 times per year. She was informed of the risk of over-replacement of vitamin D and agrees to not increase her dose unless she discusses this with [name] first.  Smoker Alison Best will continue to decrease smoking over time. She will consider habits while one is working  with hands.  Depression with Emotional Eating Behaviors We discussed behavior modification techniques today to help Alison Best deal with her emotional eating and depression. We will refer [name] to Dr. Mallie Mussel (bariatric psychologist). She will follow up as directed.  Obesity Alison Best is currently in the action stage of change. As such, her goal is to continue with weight loss efforts She has agreed to follow the Category 2 plan Alison Best will continue exercise for weight loss and overall health benefits. We discussed the following Behavioral Modification Strategies today: increase H2O intake, no skipping meals, keeping healthy foods in the home, increasing lean protein intake, decreasing simple carbohydrates, increasing vegetables, decrease eating out and work on meal planning and easy cooking plans Alison Best will weigh herself each day. She will spread out her protein as needed.  Alison Best has agreed to follow up with our clinic in 2 weeks. She was informed of the importance of frequent follow up visits to maximize her success with intensive lifestyle modifications for her multiple health conditions.  ALLERGIES: Allergies  Allergen Reactions   Levofloxacin In D5w Other (See Comments)    Achilles tendon weakness   Mangifera [employer]ica Anaphylaxis   Mango Flavor Swelling   Bupropion Nausea And Vomiting   Levaquin [Levofloxacin] Other (See Comments)    Severe tendonitis Tolerates Cipro    MEDICATIONS: Current Outpatient Medications on File Prior to Visit  Medication Sig Dispense Refill   acetaminophen (TYLENOL) 325 MG tablet Take 2 tablets (650 mg total) by mouth every 6 (six) hours as needed for moderate pain or headache.     albuterol (PROVENTIL HFA;VENTOLIN HFA) 108 (90 Base) MCG/ACT inhaler INHALE 2 PUFFS INTO THE LUNGS EVERY 4 HOURS AS NEEDED FOR WHEEZING 8.5 g 6   amoxicillin-clavulanate (AUGMENTIN) 875-125 MG tablet Take 1 tablet by mouth 2 (two) times daily. 14 tablet 0   amoxicillin-clavulanate  (AUGMENTIN) 875-125 MG tablet Take 1 tablet by mouth 2 (two) times daily. 20 tablet 0   aspirin 81 MG tablet Take 81 mg by mouth daily.     benzonatate (TESSALON PERLES) 100 MG capsule Take 1 capsule (100 mg total) by mouth 3 (three) times daily as needed. 20 capsule 0   Coenzyme Q10 (CO Q 10 PO) Take 1 capsule by mouth daily.     diclofenac (VOLTAREN) 75 MG EC tablet TAKE 1 TABLET(75 MG) BY MOUTH TWICE DAILY 60 tablet 11   diltiazem (CARTIA XT) 120 MG 24 hr capsule Take 1 capsule (120 mg total) by mouth daily. 90 capsule 3   fluticasone (FLONASE) 50 MCG/ACT nasal spray Place 2 sprays into both nostrils daily. 16 g 6   furosemide (LASIX) 20 MG tablet TAKE 1 TABLET BY MOUTH DAILY AS NEEDED FOR FLUID RETENTION 30 tablet 2   metFORMIN (GLUCOPHAGE) 500 MG tablet TAKE 1 TABLET(500 MG) BY MOUTH TWICE DAILY WITH A MEAL 180 tablet 3   methimazole (TAPAZOLE) 5 MG tablet TAKE 1/2 tab (2.5mg ) daily. 135 tablet 0  metroNIDAZOLE (METROGEL) 1 % gel APPLY EXTERNALLY TO THE AFFECTED AREA DAILY 60 g 11   Multiple Vitamins-Minerals (MULTIVITAMIN WITH MINERALS) tablet Take 1 tablet by mouth daily.     multivitamin-lutein (OCUVITE-LUTEIN) CAPS capsule Take 1 capsule by mouth daily.     Omega-3 Fatty Acids (FISH OIL PO) Take 1 capsule by mouth daily.     TURMERIC PO Take 1 tablet by mouth daily.     Vitamin D, Ergocalciferol, (DRISDOL) 1.25 MG (50000 UT) CAPS capsule Take 1 capsule (50,000 Units total) by mouth every 7 (seven) days. 4 capsule 0   No current facility-administered medications on file prior to visit.     PAST MEDICAL HISTORY: Past Medical History:  Diagnosis Date   Anal fissure    Arthritis    Bursitis    Constipation    COPD (chronic obstructive pulmonary disease) (HCC)    Diabetes mellitus without complication ([place])    sees Dr. Cruzita Lederer    Diverticulitis of colon 02/2015   Ectopic pregnancy    Emphysema of lung ([place])    Frozen shoulder    Heart murmur     Hyperlipidemia    Hyperthyroidism    Infertility, female    Irregular heartbeat    Joint pain    Obesity    Osteoarthritis    Sleep apnea    Swallowing difficulty    Thyroid disease    hyperthyroid    PAST SURGICAL HISTORY: Past Surgical History:  Procedure Laterality Date   APPENDECTOMY     CESAREAN SECTION     COLONOSCOPY  09-10-10   per Dr. Acquanetta Sit, diverticulosis of descending colon and sigmoid, internal hemorrhoids, repeat in 10 yrs   COLOSTOMY N/A 02/20/2015   Procedure: COLOSTOMY;  Surgeon: Jackolyn Confer, MD;  Location: [facility];  Service: General;  Laterality: N/A;   COLOSTOMY REVISION  02/20/2015   Procedure: Sigmoid Colectomy;  Surgeon: Jackolyn Confer, MD;  Location: [facility];  Service: General;;   CYSTOSCOPY N/A 02/20/2015   Procedure: Consuela Mimes;  Surgeon: Kathie Rhodes, MD;  Location: [facility];  Service: Urology;  Laterality: N/A;   DILATION AND CURETTAGE OF UTERUS     x3   ILEO LOOP COLOSTOMY CLOSURE N/A 09/26/2015   Procedure: LAPAROSCOPIC LYSIS OF ADHESIONS, COLOSTOMY CLOSURE;  Surgeon: Jackolyn Confer, MD;  Location: [facility];  Service: General;  Laterality: N/A;   LAPAROTOMY N/A 02/20/2015   Procedure: Emergency EXPLORATORY and drainiage of interabdominal abcesses;  Surgeon: Jackolyn Confer, MD;  Location: [facility];  Service: General;  Laterality: N/A;   SALPINGOOPHORECTOMY Left 02/20/2015   Procedure: SALPINGO OOPHORECTOMY;  Surgeon: Jackolyn Confer, MD;  Location: [facility];  Service: General;  Laterality: Left;   TONSILLECTOMY  1980    SOCIAL HISTORY: Social History   Tobacco Use   Smoking status: Current Every Day Smoker    Packs/day: 0.25    Years: 40.00    Pack years: 10.00    Types: Cigarettes   Smokeless tobacco: Never Used   Tobacco comment: less than 1/2 pack a day  Substance Use Topics   Alcohol use: Yes    Alcohol/week: 0.0 standard drinks    Comment: occ   Drug use: No    FAMILY HISTORY: Family History  Problem Relation  Age of Onset   Breast cancer Mother    High blood pressure Mother    Kidney disease Mother    Sleep apnea Mother    Obesity Mother    CAD Father  CABG   Skin cancer Father    Lung cancer Father    Diverticulitis Father    Colon polyps Father    High blood pressure Father    Stroke Father    Heart disease Father    Sleep apnea Father    COPD Other    Cancer Other    Heart disease Other    Stroke Other    Allergies Brother    Breast cancer Maternal Grandmother    Heart attack Maternal Grandfather    Heart attack Paternal Grandfather    Hypertension Neg Hx     ROS: Review of Systems  Constitutional: Negative for weight loss.  Gastrointestinal: Negative for nausea and vomiting.  Musculoskeletal:       Negative for muscle weakness  Endo/Heme/Allergies:       Negative for polyphagia  Psychiatric/Behavioral: Positive for depression. Negative for suicidal ideas.       Positive for stress eating    PHYSICAL EXAM: Pt in no acute distress  RECENT LABS AND TESTS: BMET    Component Value Date/Time   NA 142 01/11/2019 1136   K 4.4 01/11/2019 1136   CL 102 01/11/2019 1136   CO2 22 01/11/2019 1136   GLUCOSE 87 01/11/2019 1136   GLUCOSE 89 11/16/2017 1655   BUN 9 01/11/2019 1136   CREATININE 0.68 01/11/2019 1136   CREATININE 0.68 02/26/2016 1654   CALCIUM 9.5 01/11/2019 1136   GFRNONAA 95 01/11/2019 1136   GFRNONAA >89 02/26/2016 1654   GFRAA 109 01/11/2019 1136   GFRAA >89 02/26/2016 1654   Lab Results  Component Value Date   HGBA1C 5.7 (A) 09/20/2018   HGBA1C 5.5 09/30/2017   HGBA1C 6.3 04/01/2017   HGBA1C 6.2 10/01/2016   HGBA1C 6.0 02/26/2016   Lab Results  Component Value Date   INSULIN 21.2 01/11/2019   CBC    Component Value Date/Time   WBC 9.5 11/16/2017 1655   RBC 4.69 11/16/2017 1655   HGB 14.6 11/16/2017 1655   HCT 43.4 11/16/2017 1655   PLT 288.0 11/16/2017 1655   MCV 92.5 11/16/2017 1655   MCH 30.1 10/07/2015  0546   MCHC 33.5 11/16/2017 1655   RDW 15.3 11/16/2017 1655   LYMPHSABS 3.1 11/16/2017 1655   MONOABS 0.6 11/16/2017 1655   EOSABS 0.1 11/16/2017 1655   BASOSABS 0.1 11/16/2017 1655   Iron/TIBC/Ferritin/ %Sat No results found for: IRON, TIBC, FERRITIN, IRONPCTSAT Lipid Panel     Component Value Date/Time   CHOL 270 (H) 11/16/2017 1655   TRIG (H) 11/16/2017 1655    462.0 Triglyceride is over 400; calculations on Lipids are invalid.   HDL 22.20 (L) 11/16/2017 1655   CHOLHDL 12 11/16/2017 1655   VLDL 56.8 (H) 05/23/2014 0933   LDLCALC 93 11/02/2011 0947   LDLDIRECT 76.0 11/16/2017 1655   Hepatic Function Panel     Component Value Date/Time   PROT 7.0 01/11/2019 1136   ALBUMIN 4.6 01/11/2019 1136   AST 23 01/11/2019 1136   ALT 25 01/11/2019 1136   ALKPHOS 84 01/11/2019 1136   BILITOT 0.3 01/11/2019 1136   BILIDIR 0.1 11/16/2017 1655   IBILI 0.6 02/25/2015 0928      Component Value Date/Time   TSH 2.44 09/20/2018 1118   TSH 4.06 11/09/2017 1557   TSH 10.28 (H) 09/30/2017 1636     Ref. Range 01/11/2019 11:36  Vitamin D, 25-Hydroxy Latest Ref Range: 30.0 - 100.0 ng/mL 27.3 (L)    I, Doreene Nest, am acting as  transcriptionist for [employer]. Owens Shark, DO  I have reviewed the above documentation for accuracy and completeness, and I agree with the above. -Jearld Lesch, DO

## 2019-02-20 ENCOUNTER — Encounter (INDEPENDENT_AMBULATORY_CARE_PROVIDER_SITE_OTHER): Payer: Self-pay | Admitting: Bariatrics

## 2019-02-21 ENCOUNTER — Other Ambulatory Visit (INDEPENDENT_AMBULATORY_CARE_PROVIDER_SITE_OTHER): Payer: Self-pay | Admitting: Bariatrics

## 2019-02-21 ENCOUNTER — Ambulatory Visit (INDEPENDENT_AMBULATORY_CARE_PROVIDER_SITE_OTHER): Payer: BLUE CROSS/BLUE SHIELD | Admitting: Psychology

## 2019-02-21 ENCOUNTER — Other Ambulatory Visit: Payer: Self-pay

## 2019-02-21 DIAGNOSIS — E559 Vitamin D deficiency, unspecified: Secondary | ICD-10-CM

## 2019-02-21 DIAGNOSIS — F3289 Other specified depressive episodes: Secondary | ICD-10-CM

## 2019-02-21 NOTE — Progress Notes (Signed)
Office: [phone]  /  Fax: [phone]    Date: February 21, 2019   Appointment Start Time: 10:00am Duration: 52 minutes Provider: Glennie Isle, Psy.D. Type of Session: Intake for Individual Therapy  Location of Patient: Home- kitchen Location of Provider: Provider's Home Type of Contact: Telepsychological Visit via [employer] WebEx  Informed Consent: Prior to proceeding with today's appointment, two pieces of identifying information were obtained from Alison Best. In addition, Neima's physical location at the time of this appointment was obtained. [name] reported she was at home in her kitchen, and provided the address. In the event of technical difficulties, [name] shared a phone number she could be reached at. [name] and this provider participated in today's telepsychological service. Also, [name] denied anyone else being present in the room or on the WebEx appointment.   The provider's role was explained to [employer]. The provider reviewed and discussed issues of confidentiality, privacy, and limits therein. In addition to verbal informed consent, written informed consent for psychological services was obtained from Alison Best prior to the initial intake interview. Written consent included information concerning the practice, financial arrangements, and confidentiality and patients' rights. Since the clinic is not a 24/7 crisis center, mental health emergency resources were shared, and the provider explained MyChart, e-mail, voicemail, and/or other messaging systems should be utilized only for non-emergency reasons. [name] acknowledged understanding of the aforementioned, and agreed to use mental health emergency resources discussed if needed. Moreover, [name] agreed information may be shared with other [facility] providers as needed for coordination of care. Written consent will be obtained.   Prior to initiating telepsychological services, [name] was provided with an informed  consent document via e-mail, which included the development of a safety plan (i.e., an emergency contact and emergency resources) in the event of an emergency/crisis. [name] returned the completed consent form prior to today's appointment via a MyChart message as this provider's clinic is closed. This provider verbally reviewed the consent form during today's appointment prior to proceeding with the appointment. [name] verbally acknowledged understanding that she is ultimately responsible for understanding her insurance benefits as it relates to reimbursement of telepsychological services. This provider also reviewed confidentiality, as it relates to telepsychological services, as well as the rationale for telepsychological services. More specifically, this provider's clinic is closed for in-person visits due to COVID-19. Therapeutic services will resume to in-person appointments once the clinic re-opens. [name] expressed understanding regarding the rationale for telepsychological services. In addition, this provider explained the telepsychological services informed consent document would be considered an addendum to the initial consent document. [name] verbally consented to proceed. Regarding MyChart messages, [name] verbally acknowledged understanding that any messages sent would be a part of her electronic medical record; therefore, visible to all providers.   Chief Complaint: [name] was referred by Dr. Jearld Lesch due to depression with emotional eating behaviors. Per the note for the visit with Dr. Jearld Lesch on 02/08/2019, "Artemisia struggles with emotional eating and using food for comfort to the extent that it is negatively impacting her health. Dericka has done some stress eating. She often snacks when she is not hungry. Khiana sometimes feels she is out of control and then feels guilty that she made poor food choices. She has been working on behavior modification techniques to help reduce her emotional eating and has been  somewhat successful. She shows no sign of suicidal or homicidal ideations."  During today's appointment, [name] reported, "I have put on a lot of weight in the last  3 years." In 2016, she indicated her colon "ruptured" due to diverticulitis. When she was sick, [name] acknowledged she "lost a lot of weight." Currently, she feels "lopsidded" physically due to the changes. Additionally, [name] discussed experiencing recent stressors, including the loss of loved ones. Regarding emotional eating, [name] stated the current state of the world is a trigger. She further added, "I think my downfall is carbs."   [name] was asked to complete a questionnaire assessing various behaviors related to emotional eating. [name] endorsed the following: overeat when you are celebrating, experience food cravings on a regular basis, eat certain foods when you are anxious, stressed, depressed, or your feelings are hurt, find food is comforting to you, not worry about what you eat when you are in a good mood and eat as a reward.  HPI: Per the note for the initial visit with Dr. Jearld Lesch on 01/11/2019, [name] eats out 7 to 8 times a week. During the initial appointment with Dr. Jearld Lesch, [name] further reported experiencing the following: significant food cravings issues , snacking frequently in the evenings, frequently making poor food choices, frequently eating larger portions than normal , binge eating behaviors and struggling with emotional eating. She also reportedly skips breakfast.   During today's appointment, [name] reported the onset of emotional eating was likely around 2005 when her son "became a teenager." She also discussed being "very involved in the community" resulting in longer days and her "grabbing food." She indicated engaging in "very little" binge eating. She shared she occassionally binges, and noted a typical binge would involve "having 3 servings" of a carb. [name] described the onset of binge of eating as around 63 as  well, and described the frequency as "twice a month." [name] denied a history of purging and engagement in other compensatory strategies, and has never been diagnosed with an eating disorder.   Mental Status Examination:  Appearance: neat Behavior: cooperative Mood: anxious Affect: mood congruent Speech: normal in rate, volume, and tone Eye Contact: appropriate Psychomotor Activity: appropriate Thought Process: linear, logical, and goal directed  Content/Perceptual Disturbances: denies suicidal and homicidal ideation, plan, and intent and no hallucinations, delusions, bizarre thinking or behavior reported or observed Orientation: time, person, place and purpose of appointment Cognition/Sensorium: memory, attention, language, and fund of knowledge intact  Insight: good Judgment: good  Family & Psychosocial History: Graci shared she has been married for 44 years, and she has one son (age 45). Currently, [name]'s mother lives next door, and Ranessa indicated she helps to take care of resulting in stress. 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 Her highest level of education is a master's degree. Sherrica noted her social support system consists of her mother, siblings and their spouses, husband, and "quite a few friends." Moreover, Mabelle stated she identifies with "Christianity, Presbyterian." She described being heavily involved with her church.   Medical History:  Past Medical History:  Diagnosis Date   Anal fissure    Arthritis    Bursitis    Constipation    COPD (chronic obstructive pulmonary disease) (HCC)    Diabetes mellitus without complication ([place])    sees Dr. Cruzita Lederer    Diverticulitis of colon 02/2015   Ectopic pregnancy    Emphysema of lung ([place])    Frozen shoulder    Heart murmur    Hyperlipidemia    Hyperthyroidism    Infertility,  female    Irregular heartbeat    Joint pain    Obesity    Osteoarthritis    Sleep apnea    Swallowing difficulty    Thyroid disease    hyperthyroid   Past Surgical History:  Procedure Laterality Date   APPENDECTOMY     CESAREAN SECTION     COLONOSCOPY  09-10-10   per Dr. Acquanetta Sit, diverticulosis of descending colon and sigmoid, internal hemorrhoids, repeat in 10 yrs   COLOSTOMY N/A 02/20/2015   Procedure: COLOSTOMY;  Surgeon: Jackolyn Confer, MD;  Location: [facility];  Service: General;  Laterality: N/A;   COLOSTOMY REVISION  02/20/2015   Procedure: Sigmoid Colectomy;  Surgeon: Jackolyn Confer, MD;  Location: [facility];  Service: General;;   CYSTOSCOPY N/A 02/20/2015   Procedure: Consuela Mimes;  Surgeon: Kathie Rhodes, MD;  Location: [facility];  Service: Urology;  Laterality: N/A;   DILATION AND CURETTAGE OF UTERUS     x3   ILEO LOOP COLOSTOMY CLOSURE N/A 09/26/2015   Procedure: LAPAROSCOPIC LYSIS OF ADHESIONS, COLOSTOMY CLOSURE;  Surgeon: Jackolyn Confer, MD;  Location: [facility];  Service: General;  Laterality: N/A;   LAPAROTOMY N/A 02/20/2015   Procedure: Emergency EXPLORATORY and drainiage of interabdominal abcesses;  Surgeon: Jackolyn Confer, MD;  Location: [facility];  Service: General;  Laterality: N/A;   SALPINGOOPHORECTOMY Left 02/20/2015   Procedure: SALPINGO OOPHORECTOMY;  Surgeon: Jackolyn Confer, MD;  Location: [facility];  Service: General;  Laterality: Left;   TONSILLECTOMY  1980   Current Outpatient Medications on File Prior to Visit  Medication Sig Dispense Refill   acetaminophen (TYLENOL) 325 MG tablet Take 2 tablets (650 mg total) by mouth every 6 (six) hours as needed for moderate pain or headache.     albuterol (PROVENTIL HFA;VENTOLIN HFA) 108 (90 Base) MCG/ACT inhaler INHALE 2 PUFFS INTO THE LUNGS EVERY 4 HOURS AS NEEDED FOR WHEEZING 8.5 g 6   amoxicillin-clavulanate (AUGMENTIN) 875-125 MG tablet Take 1 tablet by mouth 2 (two) times daily. 14 tablet 0    amoxicillin-clavulanate (AUGMENTIN) 875-125 MG tablet Take 1 tablet by mouth 2 (two) times daily. 20 tablet 0   aspirin 81 MG tablet Take 81 mg by mouth daily.     benzonatate (TESSALON PERLES) 100 MG capsule Take 1 capsule (100 mg total) by mouth 3 (three) times daily as needed. 20 capsule 0   Coenzyme Q10 (CO Q 10 PO) Take 1 capsule by mouth daily.     diclofenac (VOLTAREN) 75 MG EC tablet TAKE 1 TABLET(75 MG) BY MOUTH TWICE DAILY 60 tablet 11   diltiazem (CARTIA XT) 120 MG 24 hr capsule Take 1 capsule (120 mg total) by mouth daily. 90 capsule 3   fluticasone (FLONASE) 50 MCG/ACT nasal spray Place 2 sprays into both nostrils daily. 16 g 6   furosemide (LASIX) 20 MG tablet TAKE 1 TABLET BY MOUTH DAILY AS NEEDED FOR FLUID RETENTION 30 tablet 2   metFORMIN (GLUCOPHAGE) 500 MG tablet TAKE 1 TABLET(500 MG) BY MOUTH TWICE DAILY WITH A MEAL 180 tablet 3   methimazole (TAPAZOLE) 5 MG tablet TAKE 1/2 tab (2.50m) daily. 135 tablet 0   metroNIDAZOLE (METROGEL) 1 % gel APPLY EXTERNALLY TO THE AFFECTED AREA DAILY 60 g 11   Multiple Vitamins-Minerals (MULTIVITAMIN WITH MINERALS) tablet Take 1 tablet by mouth daily.     multivitamin-lutein (OCUVITE-LUTEIN) CAPS capsule Take 1 capsule by mouth daily.     Omega-3 Fatty Acids (FISH OIL PO) Take 1 capsule by mouth daily.  TURMERIC PO Take 1 tablet by mouth daily.     Vitamin D, Ergocalciferol, (DRISDOL) 1.25 MG (50000 UT) CAPS capsule Take 1 capsule (50,000 Units total) by mouth every 7 (seven) days. 4 capsule 0   No current facility-administered medications on file prior to visit.   Kadesia denied a history of head injuries and loss of consciousness.   Mental Health History: Monroe shared she has never received therapeutic services. Krystina also denied a history of hospitalizations for psychiatric concerns, and has never met with a psychiatrist; however, she shared that while on a trip in 2006, she went to urgent care due to a fear of heights. Jesilyn was  reportedly prescribed Lorazepam, and she noted she took only 2 doses. Regarding family history of mental health, she indicated her father saw a counselor, but she was unsure why he sought services. Moreover, Lakea denied a trauma history, including psychological, physical  and sexual abuse, as well as neglect.   Lowell described her typical mood as "optimistic and happy;" however, she acknowledged feeling down at times due to the pandemic. She further reported experiencing the following: fatigue; overeating; decreased self-esteem due to weight; and worry thoughts regarding the well-being of her husband and son. She explained she has also experienced "impatience with working and adapting to the work flow." Additionally, she noted memory concerns in that she sometimes forgets names, but believes it is related to normal aging. Moreover, Kamela denied recreational and illicit substance use. She acknowledged smoking 9-10 cigarettes a day, and noted she has been talking to Dr. Owens Shark about reducing the number of cigarettes a day. She reported she "occasionally" consumes alcohol, and clarified she drinks approximately one standard glass of wine 2-3 times a month.   Miri denied experiencing the following: anhedonia, depressed mood, hopelessness, sleep difficulties, attention and concentration issues, feeling fidgety/restless, obsessions and compulsions, hallucinations and delusions, paranoia, mania, angry outbursts, social withdrawal, crying spells and decreased motivation. She also denied history of and current suicidal ideation, plan, and intent; history of and current homicidal ideation, plan, and intent; and history of and current engagement in self-harm.  The following strengths were reported by [name]: optimistic, organized, good people person, leader, and love for others. The following strengths were observed by this provider: ability to express thoughts and feelings during the therapeutic session, ability to establish  and benefit from a therapeutic relationship, ability to learn and practice coping skills, willingness to work toward established goal(s) with the clinic and ability to engage in reciprocal conversation.  Legal History: [place] denied a history of legal involvement.   Structured Assessment Results: The Patient Health Questionnaire-9 (PHQ-9) is a self-report measure that assesses symptoms and severity of depression over the course of the last two weeks. Denajah obtained a score of 3 suggesting mild depression. [name] finds the endorsed symptoms to be not difficult at all. Depression screen [facility] 2/9 02/21/2019  Decreased Interest 0  Down, Depressed, Hopeless 1  PHQ - 2 Score 1  Altered sleeping 0  Tired, decreased energy 1  Change in appetite 1  Feeling bad or failure about yourself  0  Trouble concentrating 0  Moving slowly or fidgety/restless 0  Suicidal thoughts 0  PHQ-9 Score 3  Difficult doing work/chores -   The Generalized Anxiety Disorder-7 (GAD-7) is a brief self-report measure that assesses symptoms of anxiety over the course of the last two weeks. Kaitlynd obtained a score of 3 suggesting minimal anxiety. GAD 7 : Generalized Anxiety Score 02/21/2019  Nervous, Anxious, on  Edge 1  Control/stop worrying 0  Worry too much - different things 0  Trouble relaxing 0  Restless 0  Easily annoyed or irritable 1  Afraid - awful might happen 1  Total GAD 7 Score 3  Anxiety Difficulty Not difficult at all   Interventions: A chart review was conducted prior to the clinical intake interview. The PHQ-9, and GAD-7 were administered and a clinical intake interview was completed. In addition, Blanche was asked to complete a Mood and Food questionnaire to assess various behaviors related to emotional eating. Throughout session, empathic reflections and validation was provided. Continuing treatment with this provider was discussed and a treatment goal was established. Psychoeducation regarding emotional versus  physical hunger was provided. Chaylee was sent a handout via e-mail to utilize between now and the next appointment to increase awareness of hunger patterns and subsequent eating.   Provisional DSM-5 Diagnosis: 311 (F32.8) Other Specified Depressive Disorder, Emotional Eating Behaviors  Plan: Shauntavia appears able and willing to participate as evidenced by collaboration on a treatment goal, engagement in reciprocal conversation, and asking questions as needed for clarification. The next appointment will be scheduled in two weeks, which will be via [employer]. The following treatment goal was established: decrease emotional eating. Once this provider's office resumes in-person appointments, Kimyata will be notified. For the aforementioned goal, Juleen can benefit from biweekly individual therapy sessions that are brief in duration for approximately four to six sessions. The treatment modality will be individual therapeutic services, including an eclectic therapeutic approach utilizing techniques from Cognitive Behavioral Therapy, Patient Centered Therapy, Dialectical Behavior Therapy, Acceptance and Commitment Therapy, Interpersonal Therapy, and Cognitive Restructuring. Therapeutic approach will include various interventions as appropriate, such as validation, support, mindfulness, thought defusion, reframing, psychoeducation, values assessment, and role playing. This provider will regularly review the treatment plan and medical chart to keep informed of status changes. Sammy expressed understanding and agreement with the initial treatment plan of care.

## 2019-02-22 ENCOUNTER — Ambulatory Visit (INDEPENDENT_AMBULATORY_CARE_PROVIDER_SITE_OTHER): Payer: BLUE CROSS/BLUE SHIELD | Admitting: Bariatrics

## 2019-02-22 ENCOUNTER — Other Ambulatory Visit: Payer: Self-pay

## 2019-02-22 ENCOUNTER — Encounter (INDEPENDENT_AMBULATORY_CARE_PROVIDER_SITE_OTHER): Payer: Self-pay | Admitting: Bariatrics

## 2019-02-22 DIAGNOSIS — R7303 Prediabetes: Secondary | ICD-10-CM

## 2019-02-22 DIAGNOSIS — F3289 Other specified depressive episodes: Secondary | ICD-10-CM

## 2019-02-22 DIAGNOSIS — F17200 Nicotine dependence, unspecified, uncomplicated: Secondary | ICD-10-CM | POA: Diagnosis not present

## 2019-02-22 DIAGNOSIS — E559 Vitamin D deficiency, unspecified: Secondary | ICD-10-CM

## 2019-02-22 DIAGNOSIS — Z6841 Body Mass Index (BMI) 40.0 and over, adult: Secondary | ICD-10-CM

## 2019-02-22 MED ORDER — VITAMIN D (ERGOCALCIFEROL) 1.25 MG (50000 UNIT) PO CAPS: 50000.0000 [IU] | ORAL_CAPSULE | ORAL | 0 refills | Status: DC

## 2019-02-26 NOTE — Progress Notes (Signed)
Office: [phone]  /  Fax: [phone] TeleHealth Visit:  Alison Best has verbally consented to this TeleHealth visit today. The patient is located at home, the provider is located at the [employer] [facility] office. The participants in this visit include the listed provider and patient and any and all parties involved. The visit was conducted today via WebEx.  HPI:   Chief Complaint: OBESITY Alison Best is here to discuss her progress with her obesity treatment plan. She is on the Category 2 plan and is following her eating plan approximately 50 % of the time. She states she is exercising 0 minutes 0 times per week. Alison Best states that she has gained 1 pound. She feels encouraged after seeing Dr. Mallie Mussel. We were unable to weigh the patient today for this TeleHealth visit. She feels as if she has gained weight since her last visit. She has lost 7 lbs since starting treatment with [name].  Pre-Diabetes Alison Best has a diagnosis of prediabetes based on her elevated Hgb A1c and was informed this puts her at greater risk of developing diabetes. She is taking metformin currently and continues to work on diet and exercise to decrease risk of diabetes. She denies polyphagia.  Smoker Alison Best is starting to cut down (8 cigarettes daily). We discussed decreasing over time at the last visit.  Vitamin D deficiency Alison Best has a diagnosis of vitamin D deficiency. She is currently taking high dose vit D and she has increased energy. She denies nausea, vomiting or muscle weakness.  Depression with emotional eating behaviors Alison Best was referred to Dr. Mallie Mussel at the last visit. She struggles with emotional eating and using food for comfort to the extent that it is negatively impacting her health. She often snacks when she is not hungry. Alison Best sometimes feels she is out of control and then feels guilty that she made poor food choices. She has been working on behavior modification techniques to help reduce her  emotional eating and has been somewhat successful. She shows no sign of suicidal or homicidal ideations.  ASSESSMENT AND PLAN:  Prediabetes  Smoker  Vitamin D deficiency - Plan: Vitamin D, Ergocalciferol, (DRISDOL) 1.25 MG (50000 UT) CAPS capsule  Other depression - with emotional eating  Class 3 severe obesity with serious comorbidity and body mass index (BMI) of 40.0 to 44.9 in adult, unspecified obesity type Alison Best)  PLAN:  Pre-Diabetes Alison Best will continue to work on weight loss, exercise, and decreasing simple carbohydrates in her diet to help decrease the risk of diabetes. We dicussed metformin including benefits and risks. She was informed that eating too many simple carbohydrates or too many calories at one sitting increases the likelihood of GI side effects. Alison Best will continue metformin for now and a prescription was not written today. Alison Best agreed to follow up with [name] as directed to monitor her progress.  Smoker Alison Best will continue to cut down smoking and follow up with our clinic in 2 weeks.  Vitamin D Deficiency Alison Best was informed that low vitamin D levels contributes to fatigue and are associated with obesity, breast, and colon cancer. She agrees to continue to take prescription Vit D @50 ,000 IU every week #4 with no refills and will follow up for routine testing of vitamin D, at least 2-3 times per year. She was informed of the risk of over-replacement of vitamin D and agrees to not increase her dose unless she discusses this with [name] first. Alison Best agrees to follow up as directed.  Depression with Emotional Eating  Behaviors We discussed behavior modification techniques today to help Alison Best deal with her emotional eating and depression. She will continue to see Dr. Mallie Mussel and she will continue some CBT techniques. Alison Best agreed to follow up as directed.  Obesity Alison Best is currently in the action stage of change. As such, her goal is to continue with weight loss efforts She has agreed to  follow the Category 2 plan Alison Best has been instructed to work up to a goal of 150 minutes of combined cardio and strengthening exercise per week or do more stretches, and walking for weight loss and overall health benefits. We discussed the following Behavioral Modification Strategies today: increase H2O intake, no skipping meals, keeping healthy foods in the home, increasing lean protein intake, decreasing simple carbohydrates, increasing vegetables, decrease eating out and work on meal planning and intentional eating. Alison Best will weigh herself at home before each visit. We discussed coping mechanisms today.  Alison Best has agreed to follow up with our clinic in 2 weeks. She was informed of the importance of frequent follow up visits to maximize her success with intensive lifestyle modifications for her multiple health conditions.  ALLERGIES: Allergies  Allergen Reactions  . Levofloxacin In D5w Other (See Comments)    Achilles tendon weakness  . Mangifera Indica Anaphylaxis  . Mango Flavor Swelling  . Bupropion Nausea And Vomiting  . Levaquin [Levofloxacin] Other (See Comments)    Severe tendonitis Tolerates Cipro    MEDICATIONS: Current Outpatient Medications on File Prior to Visit  Medication Sig Dispense Refill  . acetaminophen (TYLENOL) 325 MG tablet Take 2 tablets (650 mg total) by mouth every 6 (six) hours as needed for moderate pain or headache.    . albuterol (PROVENTIL HFA;VENTOLIN HFA) 108 (90 Base) MCG/ACT inhaler INHALE 2 PUFFS INTO THE LUNGS EVERY 4 HOURS AS NEEDED FOR WHEEZING 8.5 g 6  . amoxicillin-clavulanate (AUGMENTIN) 875-125 MG tablet Take 1 tablet by mouth 2 (two) times daily. 14 tablet 0  . amoxicillin-clavulanate (AUGMENTIN) 875-125 MG tablet Take 1 tablet by mouth 2 (two) times daily. 20 tablet 0  . aspirin 81 MG tablet Take 81 mg by mouth daily.    . benzonatate (TESSALON PERLES) 100 MG capsule Take 1 capsule (100 mg total) by mouth 3 (three) times daily as needed. 20  capsule 0  . Coenzyme Q10 (CO Q 10 PO) Take 1 capsule by mouth daily.    . diclofenac (VOLTAREN) 75 MG EC tablet TAKE 1 TABLET(75 MG) BY MOUTH TWICE DAILY 60 tablet 11  . diltiazem (CARTIA XT) 120 MG 24 hr capsule Take 1 capsule (120 mg total) by mouth daily. 90 capsule 3  . fluticasone (FLONASE) 50 MCG/ACT nasal spray Place 2 sprays into both nostrils daily. 16 g 6  . furosemide (LASIX) 20 MG tablet TAKE 1 TABLET BY MOUTH DAILY AS NEEDED FOR FLUID RETENTION 30 tablet 2  . metFORMIN (GLUCOPHAGE) 500 MG tablet TAKE 1 TABLET(500 MG) BY MOUTH TWICE DAILY WITH A MEAL 180 tablet 3  . methimazole (TAPAZOLE) 5 MG tablet TAKE 1/2 tab (2.5mg ) daily. 135 tablet 0  . metroNIDAZOLE (METROGEL) 1 % gel APPLY EXTERNALLY TO THE AFFECTED AREA DAILY 60 g 11  . Multiple Vitamins-Minerals (MULTIVITAMIN WITH MINERALS) tablet Take 1 tablet by mouth daily.    . multivitamin-lutein (OCUVITE-LUTEIN) CAPS capsule Take 1 capsule by mouth daily.    . Omega-3 Fatty Acids (FISH OIL PO) Take 1 capsule by mouth daily.    . TURMERIC PO Take 1 tablet by mouth daily.  No current facility-administered medications on file prior to visit.     PAST MEDICAL HISTORY: Past Medical History:  Diagnosis Date  . Anal fissure   . Arthritis   . Bursitis   . Constipation   . COPD (chronic obstructive pulmonary disease) ([place])   . Diabetes mellitus without complication ([place])    sees Dr. Cruzita Lederer   . Diverticulitis of colon 02/2015  . Ectopic pregnancy   . Emphysema of lung ([place])   . Frozen shoulder   . Heart murmur   . Hyperlipidemia   . Hyperthyroidism   . Infertility, female   . Irregular heartbeat   . Joint pain   . Obesity   . Osteoarthritis   . Sleep apnea   . Swallowing difficulty   . Thyroid disease    hyperthyroid    PAST SURGICAL HISTORY: Past Surgical History:  Procedure Laterality Date  . APPENDECTOMY    . CESAREAN SECTION    . COLONOSCOPY  09-10-10   per Dr. Acquanetta Sit, diverticulosis of descending  colon and sigmoid, internal hemorrhoids, repeat in 10 yrs  . COLOSTOMY N/A 02/20/2015   Procedure: COLOSTOMY;  Surgeon: Jackolyn Confer, MD;  Location: [facility];  Service: General;  Laterality: N/A;  . COLOSTOMY REVISION  02/20/2015   Procedure: Sigmoid Colectomy;  Surgeon: Jackolyn Confer, MD;  Location: [facility];  Service: General;;  . CYSTOSCOPY N/A 02/20/2015   Procedure: Consuela Mimes;  Surgeon: Kathie Rhodes, MD;  Location: [facility];  Service: Urology;  Laterality: N/A;  . DILATION AND CURETTAGE OF UTERUS     x3  . ILEO LOOP COLOSTOMY CLOSURE N/A 09/26/2015   Procedure: LAPAROSCOPIC LYSIS OF ADHESIONS, COLOSTOMY CLOSURE;  Surgeon: Jackolyn Confer, MD;  Location: [facility];  Service: General;  Laterality: N/A;  . LAPAROTOMY N/A 02/20/2015   Procedure: Emergency EXPLORATORY and drainiage of interabdominal abcesses;  Surgeon: Jackolyn Confer, MD;  Location: [facility];  Service: General;  Laterality: N/A;  . SALPINGOOPHORECTOMY Left 02/20/2015   Procedure: SALPINGO OOPHORECTOMY;  Surgeon: Jackolyn Confer, MD;  Location: [facility];  Service: General;  Laterality: Left;  . TONSILLECTOMY  1980    SOCIAL HISTORY: Social History   Tobacco Use  . Smoking status: Current Every Day Smoker    Packs/day: 0.25    Years: 40.00    Pack years: 10.00    Types: Cigarettes  . Smokeless tobacco: Never Used  . Tobacco comment: less than 1/2 pack a day  Substance Use Topics  . Alcohol use: Yes    Alcohol/week: 0.0 standard drinks    Comment: occ  . Drug use: No    FAMILY HISTORY: Family History  Problem Relation Age of Onset  . Breast cancer Mother   . High blood pressure Mother   . Kidney disease Mother   . Sleep apnea Mother   . Obesity Mother   . CAD Father        CABG  . Skin cancer Father   . Lung cancer Father   . Diverticulitis Father   . Colon polyps Father   . High blood pressure Father   . Stroke Father   . Heart disease Father   . Sleep apnea Father   . COPD Other   . Cancer Other   . Heart  disease Other   . Stroke Other   . Allergies Brother   . Breast cancer Maternal Grandmother   . Heart attack Maternal Grandfather   . Heart attack Paternal Grandfather   . Hypertension Neg Hx  ROS: Review of Systems  Constitutional: Negative for malaise/fatigue and weight loss.  Gastrointestinal: Negative for nausea and vomiting.  Musculoskeletal:       Negative for muscle weakness  Endo/Heme/Allergies:       Negative for polyphagia  Psychiatric/Behavioral: Positive for depression. Negative for suicidal ideas.    PHYSICAL EXAM: Pt in no acute distress  RECENT LABS AND TESTS: BMET    Component Value Date/Time   NA 142 01/11/2019 1136   K 4.4 01/11/2019 1136   CL 102 01/11/2019 1136   CO2 22 01/11/2019 1136   GLUCOSE 87 01/11/2019 1136   GLUCOSE 89 11/16/2017 1655   BUN 9 01/11/2019 1136   CREATININE 0.68 01/11/2019 1136   CREATININE 0.68 02/26/2016 1654   CALCIUM 9.5 01/11/2019 1136   GFRNONAA 95 01/11/2019 1136   GFRNONAA >89 02/26/2016 1654   GFRAA 109 01/11/2019 1136   GFRAA >89 02/26/2016 1654   Lab Results  Component Value Date   HGBA1C 5.7 (A) 09/20/2018   HGBA1C 5.5 09/30/2017   HGBA1C 6.3 04/01/2017   HGBA1C 6.2 10/01/2016   HGBA1C 6.0 02/26/2016   Lab Results  Component Value Date   INSULIN 21.2 01/11/2019   CBC    Component Value Date/Time   WBC 9.5 11/16/2017 1655   RBC 4.69 11/16/2017 1655   HGB 14.6 11/16/2017 1655   HCT 43.4 11/16/2017 1655   PLT 288.0 11/16/2017 1655   MCV 92.5 11/16/2017 1655   MCH 30.1 10/07/2015 0546   MCHC 33.5 11/16/2017 1655   RDW 15.3 11/16/2017 1655   LYMPHSABS 3.1 11/16/2017 1655   MONOABS 0.6 11/16/2017 1655   EOSABS 0.1 11/16/2017 1655   BASOSABS 0.1 11/16/2017 1655   Iron/TIBC/Ferritin/ %Sat No results found for: IRON, TIBC, FERRITIN, IRONPCTSAT Lipid Panel     Component Value Date/Time   CHOL 270 (H) 11/16/2017 1655   TRIG (H) 11/16/2017 1655    462.0 Triglyceride is over 400; calculations on  Lipids are invalid.   HDL 22.20 (L) 11/16/2017 1655   CHOLHDL 12 11/16/2017 1655   VLDL 56.8 (H) 05/23/2014 0933   LDLCALC 93 11/02/2011 0947   LDLDIRECT 76.0 11/16/2017 1655   Hepatic Function Panel     Component Value Date/Time   PROT 7.0 01/11/2019 1136   ALBUMIN 4.6 01/11/2019 1136   AST 23 01/11/2019 1136   ALT 25 01/11/2019 1136   ALKPHOS 84 01/11/2019 1136   BILITOT 0.3 01/11/2019 1136   BILIDIR 0.1 11/16/2017 1655   IBILI 0.6 02/25/2015 0928      Component Value Date/Time   TSH 2.44 09/20/2018 1118   TSH 4.06 11/09/2017 1557   TSH 10.28 (H) 09/30/2017 1636     Ref. Range 01/11/2019 11:36  Vitamin D, 25-Hydroxy Latest Ref Range: 30.0 - 100.0 ng/mL 27.3 (L)    I, Doreene Nest, am acting as Location manager for [employer]. Owens Shark, DO  I have reviewed the above documentation for accuracy and completeness, and I agree with the above. -Jearld Lesch, DO

## 2019-02-27 ENCOUNTER — Encounter (INDEPENDENT_AMBULATORY_CARE_PROVIDER_SITE_OTHER): Payer: Self-pay | Admitting: Bariatrics

## 2019-03-06 ENCOUNTER — Ambulatory Visit (INDEPENDENT_AMBULATORY_CARE_PROVIDER_SITE_OTHER): Payer: BLUE CROSS/BLUE SHIELD | Admitting: Psychology

## 2019-03-06 ENCOUNTER — Other Ambulatory Visit: Payer: Self-pay

## 2019-03-06 DIAGNOSIS — F3289 Other specified depressive episodes: Secondary | ICD-10-CM | POA: Diagnosis not present

## 2019-03-06 NOTE — Progress Notes (Signed)
Office: [phone]  /  Fax: [phone]    Date: Mar 06, 2019   Appointment Start Time: 12:04pm Duration: 29 minutes Provider: Glennie Isle, Psy.D. Type of Session: Individual Therapy  Location of Patient: Home Location of Provider: Provider's Home Type of Contact: Telepsychological Visit via [employer] WebEx   Session Content: Alison Best is a 63 y.o. female presenting via [place] for a follow-up appointment to address the previously established treatment goal of decreasing emotional eating. Today's appointment was a telepsychological visit, as this provider's clinic is closed for in-person visits due to COVID-19. Therapeutic services will resume to in-person appointments once the clinic re-opens. Alison Best expressed understanding regarding the rationale for telepsychological services, and provided verbal consent for today's appointment. Prior to proceeding with today's appointment, Alison Best's physical location at the time of this appointment was obtained. Alison Best reported she was at home and provided the address. In the event of technical difficulties, Alison Best shared a phone number she could be reached at. Alison Best and this provider participated in today's telepsychological service. Also, Alison Best denied anyone else being present in the room or on the WebEx appointment. Of note, this provider called Alison Best at 12:02pm as she did not present for the WebEx appointment. This provider assisted Alison Best to connect. As such, the appointment was initiated 4 minutes late.   This provider conducted a brief check-in and verbally administered the PHQ-9 and GAD-7. Alison Best shared experiencing a "funk" last week due to worry about the future as it relates to work and the pandemic. She also acknowledged experiencing "more emotional eating then physical eating." She explained emotional eating is typically in the evenings. Psychoeducation regarding triggers for emotional eating was provided. Alison Best provided a handout via e-mail, and encouraged to  utilize the handout between now and the next appointment to increase awareness of triggers and frequency. Alison Best agreed. This provider also discussed behavioral strategies for specific triggers, such as placing the utensil down when conversing to avoid mindless eating. Alison Best provided verbal consent during today's appointment for this provider to send the handout for triggers for emotional eating via e-mail. Overall, Alison Best was receptive to today's session as evidenced by openness to sharing, responsiveness to feedback, and willingness to identify triggers for emotional eating.  Mental Status Examination:  Appearance: neat Behavior: cooperative Mood: euthymic Affect: mood congruent Speech: normal in rate, volume, and tone Eye Contact: appropriate Psychomotor Activity: appropriate Thought Process: linear, logical, and goal directed  Content/Perceptual Disturbances: denies suicidal and homicidal ideation, plan, and intent and no hallucinations, delusions, bizarre thinking or behavior reported or observed Orientation: time, person, place and purpose of appointment Cognition/Sensorium: memory, attention, language, and fund of knowledge intact  Insight: good Judgment: good  Structured Assessment Results: The Patient Health Questionnaire-9 (PHQ-9) is a self-report measure that assesses symptoms and severity of depression over the course of the last two weeks. Alison Best obtained a score of 3 suggesting minimal depression. Alison Best finds the endorsed symptoms to be not difficult at all. Decreased interest 0  Down, depressed, hopeless 1  Altered sleeping 0  Tired, decreased energy 0  Change in appetite 2  Feeling bad or failure about yourself 0  Trouble concentrating 0  Moving slowly or fidgety/restless 0  Suicidal thoughts 0  PHQ-9 Score 3    The Generalized Anxiety Disorder-7 (GAD-7) is a brief self-report measure that assesses symptoms of anxiety over the course of the last two weeks. Alison Best obtained a score  of 2 suggesting minimal anxiety. Alison Best finds the endorsed symptoms to be somewhat difficult. Nervous,  anxious, on edge 1  Control/stop worrying 0  Worrying too much- different things 0  Trouble relaxing 0  Restless 0  Easily annoyed or irritable 1  Afraid-awful might happen 0  GAD-7 Score 2   Interventions:  Verbal administration of PHQ-9 and GAD-7 for symptom monitoring Reviewed content from the previous session Provided empathic reflections and validation Psychoeducation provided regarding triggers for emotional eating Focused on rapport building Conducted a brief chart review  DSM-5 Diagnosis: 311 (F32.8) Other Specified Depressive Disorder, Emotional Eating Behaviors  Treatment Goal & Progress: During the initial appointment with this provider, the following treatment goal was established: decrease emotional eating. Progress is limited, as Alison Best has just begun treatment with this provider; however, she is receptive to the interaction and interventions and rapport is being established. Nevertheless, Alison Best has demonstrated some progress in her goal as evidenced by increased awareness of hunger patterns and willingness to identify triggers for emotional eating.   Plan: Alison Best continues to appear able and willing to participate as evidenced by engagement in reciprocal conversation, and asking questions for clarification as appropriate. The next appointment will be scheduled in two weeks, which will be via [employer]. Once this provider's office resumes in-person appointments, Alison Best will be notified. The next session will focus on reviewing triggers for emotional eating and the introduction of pleasurable activities.

## 2019-03-08 ENCOUNTER — Other Ambulatory Visit: Payer: Self-pay

## 2019-03-08 ENCOUNTER — Ambulatory Visit (INDEPENDENT_AMBULATORY_CARE_PROVIDER_SITE_OTHER): Payer: BLUE CROSS/BLUE SHIELD | Admitting: Bariatrics

## 2019-03-08 DIAGNOSIS — E559 Vitamin D deficiency, unspecified: Secondary | ICD-10-CM

## 2019-03-08 DIAGNOSIS — F17200 Nicotine dependence, unspecified, uncomplicated: Secondary | ICD-10-CM | POA: Diagnosis not present

## 2019-03-08 DIAGNOSIS — E66813 Obesity, class 3: Secondary | ICD-10-CM

## 2019-03-08 DIAGNOSIS — F3289 Other specified depressive episodes: Secondary | ICD-10-CM | POA: Diagnosis not present

## 2019-03-08 DIAGNOSIS — Z6841 Body Mass Index (BMI) 40.0 and over, adult: Secondary | ICD-10-CM

## 2019-03-09 ENCOUNTER — Other Ambulatory Visit: Payer: Self-pay

## 2019-03-09 ENCOUNTER — Ambulatory Visit (INDEPENDENT_AMBULATORY_CARE_PROVIDER_SITE_OTHER): Payer: BLUE CROSS/BLUE SHIELD | Admitting: Family Medicine

## 2019-03-09 ENCOUNTER — Encounter: Payer: Self-pay | Admitting: Family Medicine

## 2019-03-09 DIAGNOSIS — R6889 Other general symptoms and signs: Secondary | ICD-10-CM | POA: Diagnosis not present

## 2019-03-09 DIAGNOSIS — J019 Acute sinusitis, unspecified: Secondary | ICD-10-CM

## 2019-03-09 DIAGNOSIS — Z20828 Contact with and (suspected) exposure to other viral communicable diseases: Secondary | ICD-10-CM

## 2019-03-09 MED ORDER — CEFUROXIME AXETIL 500 MG PO TABS: 500.0000 mg | ORAL_TABLET | Freq: Two times a day (BID) | ORAL | 0 refills | Status: DC

## 2019-03-09 NOTE — Progress Notes (Signed)
Subjective:    Patient ID: Alison Best, female    DOB: 1956/01/06, 63 y.o.   MRN: [id number]  HPI Virtual Visit via Video Note  I connected with the patient on 03/09/19 at  8:45 AM EDT by a video enabled telemedicine application and verified that I am speaking with the correct person using two identifiers.  Location patient: home Location provider:work or home office Persons participating in the virtual visit: patient, provider  I discussed the limitations of evaluation and management by telemedicine and the availability of in person appointments. The patient expressed understanding and agreed to proceed.   HPI: Here for 2 issues. First for the past 10 days she has had sinus pressure, PND, and blowing green mucus from the nose. She has had low grade fevers. No cough or SOB or NVD. Using Claritin. Secondly, she is interested in being tested for Covid-19 antibodies. In March she and her husband were ill for about one week and they both had fevers, fatigue, headaches, and dizziness. No cough or SOB or NVD. If she were to test positive for the antibodies, she anticipates donating plasma to help treat virus patients.    ROS: See pertinent positives and negatives per HPI.  Past Medical History:  Diagnosis Date  . Anal fissure   . Arthritis   . Bursitis   . Constipation   . COPD (chronic obstructive pulmonary disease) ([place])   . Diabetes mellitus without complication ([place])    sees Dr. Cruzita Lederer   . Diverticulitis of colon 02/2015  . Ectopic pregnancy   . Emphysema of lung ([place])   . Frozen shoulder   . Heart murmur   . Hyperlipidemia   . Hyperthyroidism   . Infertility, female   . Irregular heartbeat   . Joint pain   . Obesity   . Osteoarthritis   . Sleep apnea   . Swallowing difficulty   . Thyroid disease    hyperthyroid    Past Surgical History:  Procedure Laterality Date  . APPENDECTOMY    . CESAREAN SECTION    . COLONOSCOPY  09-10-10   per Dr. Acquanetta Sit,  diverticulosis of descending colon and sigmoid, internal hemorrhoids, repeat in 10 yrs  . COLOSTOMY N/A 02/20/2015   Procedure: COLOSTOMY;  Surgeon: Jackolyn Confer, MD;  Location: [facility];  Service: General;  Laterality: N/A;  . COLOSTOMY REVISION  02/20/2015   Procedure: Sigmoid Colectomy;  Surgeon: Jackolyn Confer, MD;  Location: [facility];  Service: General;;  . CYSTOSCOPY N/A 02/20/2015   Procedure: Consuela Mimes;  Surgeon: Kathie Rhodes, MD;  Location: [facility];  Service: Urology;  Laterality: N/A;  . DILATION AND CURETTAGE OF UTERUS     x3  . ILEO LOOP COLOSTOMY CLOSURE N/A 09/26/2015   Procedure: LAPAROSCOPIC LYSIS OF ADHESIONS, COLOSTOMY CLOSURE;  Surgeon: Jackolyn Confer, MD;  Location: [facility];  Service: General;  Laterality: N/A;  . LAPAROTOMY N/A 02/20/2015   Procedure: Emergency EXPLORATORY and drainiage of interabdominal abcesses;  Surgeon: Jackolyn Confer, MD;  [facility];  Service: General;  Laterality: N/A;  . SALPINGOOPHORECTOMY Left 02/20/2015   Procedure: SALPINGO OOPHORECTOMY;  Surgeon: Jackolyn Confer, MD;  [facility]: [facility];  Service: General;  Laterality: Left;  . TONSILLECTOMY  1980    Family History  Problem Relation Age of Onset  . Breast cancer Mother   . High blood pressure Mother   . Kidney disease Mother   . Sleep apnea Mother   . Obesity Mother   . CAD Father  CABG  . Skin cancer Father   . Lung cancer Father   . Diverticulitis Father   . Colon polyps Father   . High blood pressure Father   . Stroke Father   . Heart disease Father   . Sleep apnea Father   . COPD Other   . Cancer Other   . Heart disease Other   . Stroke Other   . Allergies Brother   . Breast cancer Maternal Grandmother   . Heart attack Maternal Grandfather   . Heart attack Paternal Grandfather   . Hypertension Neg Hx      Current Outpatient Medications:  .  acetaminophen (TYLENOL) 325 MG tablet, Take 2 tablets (650 mg total) by mouth every 6 (six) hours as needed for moderate  pain or headache., Disp: , Rfl:  .  albuterol (PROVENTIL HFA;VENTOLIN HFA) 108 (90 Base) MCG/ACT inhaler, INHALE 2 PUFFS INTO THE LUNGS EVERY 4 HOURS AS NEEDED FOR WHEEZING, Disp: 8.5 g, Rfl: 6 .  amoxicillin-clavulanate (AUGMENTIN) 875-125 MG tablet, Take 1 tablet by mouth 2 (two) times daily., Disp: 20 tablet, Rfl: 0 .  aspirin 81 MG tablet, Take 81 mg by mouth daily., Disp: , Rfl:  .  benzonatate (TESSALON PERLES) 100 MG capsule, Take 1 capsule (100 mg total) by mouth 3 (three) times daily as needed., Disp: 20 capsule, Rfl: 0 .  Coenzyme Q10 (CO Q 10 PO), Take 1 capsule by mouth daily., Disp: , Rfl:  .  diclofenac (VOLTAREN) 75 MG EC tablet, TAKE 1 TABLET(75 MG) BY MOUTH TWICE DAILY, Disp: 60 tablet, Rfl: 11 .  diltiazem (CARTIA XT) 120 MG 24 hr capsule, Take 1 capsule (120 mg total) by mouth daily., Disp: 90 capsule, Rfl: 3 .  fluticasone (FLONASE) 50 MCG/ACT nasal spray, Place 2 sprays into both nostrils daily., Disp: 16 g, Rfl: 6 .  furosemide (LASIX) 20 MG tablet, TAKE 1 TABLET BY MOUTH DAILY AS NEEDED FOR FLUID RETENTION, Disp: 30 tablet, Rfl: 2 .  metFORMIN (GLUCOPHAGE) 500 MG tablet, TAKE 1 TABLET(500 MG) BY MOUTH TWICE DAILY WITH A MEAL, Disp: 180 tablet, Rfl: 3 .  methimazole (TAPAZOLE) 5 MG tablet, TAKE 1/2 tab (2.5mg ) daily., Disp: 135 tablet, Rfl: 0 .  metroNIDAZOLE (METROGEL) 1 % gel, APPLY EXTERNALLY TO THE AFFECTED AREA DAILY, Disp: 60 g, Rfl: 11 .  Multiple Vitamins-Minerals (MULTIVITAMIN WITH MINERALS) tablet, Take 1 tablet by mouth daily., Disp: , Rfl:  .  multivitamin-lutein (OCUVITE-LUTEIN) CAPS capsule, Take 1 capsule by mouth daily., Disp: , Rfl:  .  Omega-3 Fatty Acids (FISH OIL PO), Take 1 capsule by mouth daily., Disp: , Rfl:  .  TURMERIC PO, Take 1 tablet by mouth daily., Disp: , Rfl:  .  Vitamin D, Ergocalciferol, (DRISDOL) 1.25 MG (50000 UT) CAPS capsule, Take 1 capsule (50,000 Units total) by mouth every 7 (seven) days., Disp: 4 capsule, Rfl: 0 .  cefUROXime (CEFTIN)  500 MG tablet, Take 1 tablet (500 mg total) by mouth 2 (two) times daily with a meal., Disp: 20 tablet, Rfl: 0  EXAM:  VITALS per patient if applicable:  GENERAL: alert, oriented, appears well and in no acute distress  HEENT: atraumatic, conjunttiva clear, no obvious abnormalities on inspection of external nose and ears  NECK: normal movements of the head and neck  LUNGS: on inspection no signs of respiratory distress, breathing rate appears normal, no obvious gross SOB, gasping or wheezing  CV: no obvious cyanosis  MS: moves all visible extremities without noticeable abnormality  PSYCH/NEURO:  pleasant and cooperative, no obvious depression or anxiety, speech and thought processing grossly intact  ASSESSMENT AND PLAN: Sinusitis, treat with Ceftin for 10 days. Add Mucinex prn. She will come in for Covid-19 antibody testing.  Alysia Penna, MD  Discussed the following assessment and plan:  Flu-like symptoms - Plan: SAR CoV2 Serology (COVID 19)AB(IGG)IA     I discussed the assessment and treatment plan with the patient. The patient was provided an opportunity to ask questions and all were answered. The patient agreed with the plan and demonstrated an understanding of the instructions.   The patient was advised to call back or seek an in-person evaluation if the symptoms worsen or if the condition fails to improve as anticipated.     Review of Systems     Objective:   Physical Exam        Assessment & Plan:

## 2019-03-12 ENCOUNTER — Other Ambulatory Visit: Payer: Self-pay

## 2019-03-12 ENCOUNTER — Other Ambulatory Visit (INDEPENDENT_AMBULATORY_CARE_PROVIDER_SITE_OTHER): Payer: BLUE CROSS/BLUE SHIELD

## 2019-03-12 ENCOUNTER — Encounter (INDEPENDENT_AMBULATORY_CARE_PROVIDER_SITE_OTHER): Payer: Self-pay | Admitting: Bariatrics

## 2019-03-12 ENCOUNTER — Encounter: Payer: Self-pay | Admitting: Family Medicine

## 2019-03-12 DIAGNOSIS — R6889 Other general symptoms and signs: Secondary | ICD-10-CM | POA: Diagnosis not present

## 2019-03-12 NOTE — Progress Notes (Signed)
Office: [phone]  /  Fax: [phone] TeleHealth Visit:  Alison Best has verbally consented to this TeleHealth visit today. The patient is located at home, the provider is located at the [employer] [facility] office. The participants in this visit include the listed provider and patient and any and all parties involved. The visit was conducted today via WebEx.  HPI:   Chief Complaint: OBESITY Alison Best is here to discuss her progress with her obesity treatment plan. She is on the Category 2 plan and is following her eating plan approximately 40 to 45 % of the time. She states she is exercising 0 minutes 0 times per week. Alison Best thinks that she has gained 4 to 5 pounds. She has a new scale (weight 226 lbs). She has identified multiple goals for losing weight. We were unable to weigh the patient today for this TeleHealth visit. She feels as if she has gained weight since her last visit. She has lost 1 lb since starting treatment with [name].  Vitamin D deficiency Alison Best has a diagnosis of vitamin D deficiency. Her last vitamin D level was at 27.3 She is currently taking vit D and denies nausea, vomiting or muscle weakness.  Smoker Alison Best is a smoker and she has been working on decreasing the number of cigarettes daily.  Depression with emotional eating behaviors Alison Best has seen Dr. Mallie Mussel (bariatric psychologist). She struggles with emotional eating and using food for comfort to the extent that it is negatively impacting her health. She often snacks when she is not hungry. Alison Best sometimes feels she is out of control and then feels guilty that she made poor food choices. She has been working on behavior modification techniques to help reduce her emotional eating and has been somewhat successful. She shows no sign of suicidal or homicidal ideations.  Depression screen [facility] 2/9 02/21/2019 01/11/2019 07/26/2017 02/05/2016 06/06/2014  Decreased Interest 0 2 0 0 0  Down, Depressed, Hopeless 1 1 0 0  0  PHQ - 2 Score 1 3 0 0 0  Altered sleeping 0 2 - - -  Tired, decreased energy 1 2 - - -  Change in appetite 1 1 - - -  Feeling bad or failure about yourself  0 1 - - -  Trouble concentrating 0 0 - - -  Moving slowly or fidgety/restless 0 2 - - -  Suicidal thoughts 0 0 - - -  PHQ-9 Score 3 11 - - -  Difficult doing work/chores - Somewhat difficult - - -    ASSESSMENT AND PLAN:  Vitamin D deficiency  Other depression  Smoker  Class 3 severe obesity with serious comorbidity and body mass index (BMI) of 40.0 to 44.9 in adult, unspecified obesity type (HCC)  PLAN:  Vitamin D Deficiency Alison Best was informed that low vitamin D levels contributes to fatigue and are associated with obesity, breast, and colon cancer. She will continue to take prescription Vit D @50 ,000 IU every week and will follow up for routine testing of vitamin D, at least 2-3 times per year. She was informed of the risk of over-replacement of vitamin D and agrees to not increase her dose unless she discusses this with [name] first.  Smoker Alison Best will continue to work on her smoking. Distraction techniques were discussed today.  Depression with Emotional Eating Behaviors We discussed behavior modification techniques today to help Alison Best deal with her emotional eating and depression. She will continue to meet with Dr. Mallie Mussel.  Obesity Alison Best is currently  in the action stage of change. As such, her goal is to continue with weight loss efforts She has agreed to follow the Category 2 plan Alison Best has been instructed to work up to a goal of 150 minutes of combined cardio and strengthening exercise per week for weight loss and overall health benefits. We discussed the following Behavioral Modification Strategies today: increase H2O intake, no skipping meals, keeping healthy foods in the home, increasing lean protein intake, decreasing simple carbohydrates, increasing vegetables, decrease eating out and work on meal planning and easy  cooking plans Alison Best will continue to weigh herself at home before each meal. Alison Best will use "Alison Best" soy based pasta (25 grams of protein). Alison Best will be aware of her emotional triggers for emotional eating.  Alison Best has agreed to follow up with our clinic in 2 weeks. She was informed of the importance of frequent follow up visits to maximize her success with intensive lifestyle modifications for her multiple health conditions.  ALLERGIES: Allergies  Allergen Reactions  . Levofloxacin In D5w Other (See Comments)    Achilles tendon weakness  . Mangifera Indica Anaphylaxis  . Mango Flavor Swelling  . Bupropion Nausea And Vomiting  . Levaquin [Levofloxacin] Other (See Comments)    Severe tendonitis Tolerates Cipro    MEDICATIONS: Current Outpatient Medications on File Prior to Visit  Medication Sig Dispense Refill  . acetaminophen (TYLENOL) 325 MG tablet Take 2 tablets (650 mg total) by mouth every 6 (six) hours as needed for moderate pain or headache.    . albuterol (PROVENTIL HFA;VENTOLIN HFA) 108 (90 Base) MCG/ACT inhaler INHALE 2 PUFFS INTO THE LUNGS EVERY 4 HOURS AS NEEDED FOR WHEEZING 8.5 g 6  . aspirin 81 MG tablet Take 81 mg by mouth daily.    . benzonatate (TESSALON PERLES) 100 MG capsule Take 1 capsule (100 mg total) by mouth 3 (three) times daily as needed. 20 capsule 0  . Coenzyme Q10 (CO Q 10 PO) Take 1 capsule by mouth daily.    . diclofenac (VOLTAREN) 75 MG EC tablet TAKE 1 TABLET(75 MG) BY MOUTH TWICE DAILY 60 tablet 11  . diltiazem (CARTIA XT) 120 MG 24 hr capsule Take 1 capsule (120 mg total) by mouth daily. 90 capsule 3  . fluticasone (FLONASE) 50 MCG/ACT nasal spray Place 2 sprays into both nostrils daily. 16 g 6  . furosemide (LASIX) 20 MG tablet TAKE 1 TABLET BY MOUTH DAILY AS NEEDED FOR FLUID RETENTION 30 tablet 2  . metFORMIN (GLUCOPHAGE) 500 MG tablet TAKE 1 TABLET(500 MG) BY MOUTH TWICE DAILY WITH A MEAL 180 tablet 3  . methimazole (TAPAZOLE) 5 MG tablet TAKE  1/2 tab (2.5mg ) daily. 135 tablet 0  . metroNIDAZOLE (METROGEL) 1 % gel APPLY EXTERNALLY TO THE AFFECTED AREA DAILY 60 g 11  . Multiple Vitamins-Minerals (MULTIVITAMIN WITH MINERALS) tablet Take 1 tablet by mouth daily.    . multivitamin-lutein (OCUVITE-LUTEIN) CAPS capsule Take 1 capsule by mouth daily.    . Omega-3 Fatty Acids (FISH OIL PO) Take 1 capsule by mouth daily.    . TURMERIC PO Take 1 tablet by mouth daily.    . Vitamin D, Ergocalciferol, (DRISDOL) 1.25 MG (50000 UT) CAPS capsule Take 1 capsule (50,000 Units total) by mouth every 7 (seven) days. 4 capsule 0   No current facility-administered medications on file prior to visit.     PAST MEDICAL HISTORY: Past Medical History:  Diagnosis Date  . Anal fissure   . Arthritis   . Bursitis   .  Constipation   . COPD (chronic obstructive pulmonary disease) ([place])   . Diabetes mellitus without complication ([place])    sees Dr. Cruzita Lederer   . Diverticulitis of colon 02/2015  . Ectopic pregnancy   . Emphysema of lung ([place])   . Frozen shoulder   . Heart murmur   . Hyperlipidemia   . Hyperthyroidism   . Infertility, female   . Irregular heartbeat   . Joint pain   . Obesity   . Osteoarthritis   . Sleep apnea   . Swallowing difficulty   . Thyroid disease    hyperthyroid    PAST SURGICAL HISTORY: Past Surgical History:  Procedure Laterality Date  . APPENDECTOMY    . CESAREAN SECTION    . COLONOSCOPY  09-10-10   per Dr. Acquanetta Sit, diverticulosis of descending colon and sigmoid, internal hemorrhoids, repeat in 10 yrs  . COLOSTOMY N/A 02/20/2015   Procedure: COLOSTOMY;  Surgeon: Jackolyn Confer, MD;  Location[facility];  Service: General;  Laterality: N/A;  . COLOSTOMY REVISION  02/20/2015   Procedure: Sigmoid Colectomy;  Surgeon: Jackolyn Confer, MD;  Location: [facility];  Service: General;;  . CYSTOSCOPY N/A 02/20/2015   Procedure: Consuela Mimes;  Surgeon: Kathie Rhodes, MD;  [facility];  Service: Urology;  Laterality: N/A;  .  DILATION AND CURETTAGE OF UTERUS     x3  . ILEO LOOP COLOSTOMY CLOSURE N/A 09/26/2015   Procedure: LAPAROSCOPIC LYSIS OF ADHESIONS, COLOSTOMY CLOSURE;  Surgeon: Jackolyn Confer, MD;  [facility];  Service: General;  Laterality: N/A;  . LAPAROTOMY N/A 02/20/2015   Procedure: Emergency EXPLORATORY and drainiage of interabdominal abcesses;  Surgeon: Jackolyn Confer, MD;  [facility];  Service: General;  Laterality: N/A;  . SALPINGOOPHORECTOMY Left 02/20/2015   Procedure: SALPINGO OOPHORECTOMY;  Surgeon: Jackolyn Confer, MD;  [facility];  Service: General;  Laterality: Left;  . TONSILLECTOMY  1980    SOCIAL HISTORY: Social History   Tobacco Use  . Smoking status: Current Every Day Smoker    Packs/day: 0.25    Years: 40.00    Pack years: 10.00    Types: Cigarettes  . Smokeless tobacco: Never Used  . Tobacco comment: less than 1/2 pack a day  Substance Use Topics  . Alcohol use: Yes    Alcohol/week: 0.0 standard drinks    Comment: occ  . Drug use: No    FAMILY HISTORY: Family History  Problem Relation Age of Onset  . Breast cancer Mother   . High blood pressure Mother   . Kidney disease Mother   . Sleep apnea Mother   . Obesity Mother   . CAD Father        CABG  . Skin cancer Father   . Lung cancer Father   . Diverticulitis Father   . Colon polyps Father   . High blood pressure Father   . Stroke Father   . Heart disease Father   . Sleep apnea Father   . COPD Other   . Cancer Other   . Heart disease Other   . Stroke Other   . Allergies Brother   . Breast cancer Maternal Grandmother   . Heart attack Maternal Grandfather   . Heart attack Paternal Grandfather   . Hypertension Neg Hx     ROS: Review of Systems  Constitutional: Negative for weight loss.  Gastrointestinal: Negative for nausea and vomiting.  Musculoskeletal:       Negative for muscle weakness  Psychiatric/Behavioral: Positive for depression. Negative  for suicidal ideas.    PHYSICAL  EXAM: Pt in no acute distress  RECENT LABS AND TESTS: BMET    Component Value Date/Time   NA 142 01/11/2019 1136   K 4.4 01/11/2019 1136   CL 102 01/11/2019 1136   CO2 22 01/11/2019 1136   GLUCOSE 87 01/11/2019 1136   GLUCOSE 89 11/16/2017 1655   BUN 9 01/11/2019 1136   CREATININE 0.68 01/11/2019 1136   CREATININE 0.68 02/26/2016 1654   CALCIUM 9.5 01/11/2019 1136   GFRNONAA 95 01/11/2019 1136   GFRNONAA >89 02/26/2016 1654   GFRAA 109 01/11/2019 1136   GFRAA >89 02/26/2016 1654   Lab Results  Component Value Date   HGBA1C 5.7 (A) 09/20/2018   HGBA1C 5.5 09/30/2017   HGBA1C 6.3 04/01/2017   HGBA1C 6.2 10/01/2016   HGBA1C 6.0 02/26/2016   Lab Results  Component Value Date   INSULIN 21.2 01/11/2019   CBC    Component Value Date/Time   WBC 9.5 11/16/2017 1655   RBC 4.69 11/16/2017 1655   HGB 14.6 11/16/2017 1655   HCT 43.4 11/16/2017 1655   PLT 288.0 11/16/2017 1655   MCV 92.5 11/16/2017 1655   MCH 30.1 10/07/2015 0546   MCHC 33.5 11/16/2017 1655   RDW 15.3 11/16/2017 1655   LYMPHSABS 3.1 11/16/2017 1655   MONOABS 0.6 11/16/2017 1655   EOSABS 0.1 11/16/2017 1655   BASOSABS 0.1 11/16/2017 1655   Iron/TIBC/Ferritin/ %Sat No results found for: IRON, TIBC, FERRITIN, IRONPCTSAT Lipid Panel     Component Value Date/Time   CHOL 270 (H) 11/16/2017 1655   TRIG (H) 11/16/2017 1655    462.0 Triglyceride is over 400; calculations on Lipids are invalid.   HDL 22.20 (L) 11/16/2017 1655   CHOLHDL 12 11/16/2017 1655   VLDL 56.8 (H) 05/23/2014 0933   LDLCALC 93 11/02/2011 0947   LDLDIRECT 76.0 11/16/2017 1655   Hepatic Function Panel     Component Value Date/Time   PROT 7.0 01/11/2019 1136   ALBUMIN 4.6 01/11/2019 1136   AST 23 01/11/2019 1136   ALT 25 01/11/2019 1136   ALKPHOS 84 01/11/2019 1136   BILITOT 0.3 01/11/2019 1136   BILIDIR 0.1 11/16/2017 1655   IBILI 0.6 02/25/2015 0928      Component Value Date/Time   TSH 2.44 09/20/2018 1118   TSH 4.06  11/09/2017 1557   TSH 10.28 (H) 09/30/2017 1636     Ref. Range 01/11/2019 11:36  Vitamin D, 25-Hydroxy Latest Ref Range: 30.0 - 100.0 ng/mL 27.3 (L)    I, Doreene Nest, am acting as Location manager for [employer]. Owens Shark, DO  I have reviewed the above documentation for accuracy and completeness, and I agree with the above. -Jearld Lesch, DO

## 2019-03-13 LAB — SAR COV2 SEROLOGY (COVID19)AB(IGG),IA: SARS CoV2 AB IGG: NEGATIVE

## 2019-03-17 ENCOUNTER — Encounter (INDEPENDENT_AMBULATORY_CARE_PROVIDER_SITE_OTHER): Payer: Self-pay | Admitting: Bariatrics

## 2019-03-19 ENCOUNTER — Other Ambulatory Visit (INDEPENDENT_AMBULATORY_CARE_PROVIDER_SITE_OTHER): Payer: Self-pay | Admitting: Bariatrics

## 2019-03-19 ENCOUNTER — Other Ambulatory Visit: Payer: Self-pay | Admitting: Internal Medicine

## 2019-03-19 DIAGNOSIS — E559 Vitamin D deficiency, unspecified: Secondary | ICD-10-CM

## 2019-03-19 DIAGNOSIS — E0590 Thyrotoxicosis, unspecified without thyrotoxic crisis or storm: Secondary | ICD-10-CM

## 2019-03-20 ENCOUNTER — Ambulatory Visit (INDEPENDENT_AMBULATORY_CARE_PROVIDER_SITE_OTHER): Payer: BLUE CROSS/BLUE SHIELD | Admitting: Psychology

## 2019-03-20 ENCOUNTER — Other Ambulatory Visit: Payer: Self-pay

## 2019-03-20 DIAGNOSIS — F3289 Other specified depressive episodes: Secondary | ICD-10-CM | POA: Diagnosis not present

## 2019-03-20 NOTE — Progress Notes (Signed)
Office: [phone]  /  Fax: [phone]    Date:Mar 20, 2019    Appointment Start Time: 10:05am Duration: 29 minutes Provider: Glennie Isle, Psy.D. Type of Session: Individual Therapy  Location of Patient: Home Location of Provider: Provider's Home Type of Contact: Telepsychological Visit via [employer] WebEx   Session Content: Alison Best is a 63 y.o. female presenting via [place] for a follow-up appointment to address the previously established treatment goal of decreasing emotional eating. Of note, this provider called Alison Best at 10:04am as she did not present for today's Webex appointment. Alison Best explained she was unable to find the e-mail sent by this provider with the secure link for today's appointment; therefore, it was re-sent. As such, today's appointment was initiated 5 minutes late. Today's appointment was a telepsychological visit, as this provider's clinic is closed for in-person visits due to COVID-19. Therapeutic services will resume to in-person appointments once the clinic re-opens. Alison Best expressed understanding regarding the rationale for telepsychological services, and provided verbal consent for today's appointment. Prior to proceeding with today's appointment, Alison Best's physical location at the time of this appointment was obtained. Alison Best reported she was at home and provided the address. In the event of technical difficulties, Alison Best shared a phone number she could be reached at. Alison Best and this provider participated in today's telepsychological service. Also, Alison Best denied anyone else being present in the room or on the WebEx appointment.  This provider conducted a brief check-in and verbally administered the PHQ-9 and GAD-7. Alison Best shared she has been "okay" and she is able to eat congruent to the meal plan during the day aside from dinner. Alison Best indicated reviewing the triggers handout sent by this provider was "helpful." Regarding triggers, Alison Best shared "there are several" triggers she can relate  to. This was explored further, especially as it relates to dinner. Alison Best explained she is responsible for her mother's dinner four nights a week resulting in her often picking up food. Moreover, Alison Best discussed ongoing worry related to COVID-19 as her husband is an Print production planner and she is a caregiver for her mother. The aforementioned was normalized and validated and it was identified stress and fatigue likely contribute to dinner choices. She further discussed that previously she would cope with burn out via traveling, but is unable to do so currently due to the pandemic. Thus, psychoeducation regarding pleasurable activities, including its impact on emotional eating and overall well-being was provided. Alison Best was provided with a handout via e-mail with various options of pleasurable activities, and was encouraged to engage in one activity a day and additional activities as needed when triggered to emotionally eat. Alison Best agreed. Alison Best provided verbal consent during today's appointment for this provider to send the handout with pleasurable activities via e-mail. Overall, Alison Best was receptive to today's session as evidenced by openness to sharing, responsiveness to feedback, and willingness to engage in pleasurable activities.  Mental Status Examination:  Appearance: neat Behavior: cooperative Mood: euthymic Affect: mood congruent Speech: normal in rate, volume, and tone Eye Contact: appropriate Psychomotor Activity: appropriate Thought Process: linear, logical, and goal directed  Content/Perceptual Disturbances: denies suicidal and homicidal ideation, plan, and intent and no hallucinations, delusions, bizarre thinking or behavior reported or observed Orientation: time, person, place and purpose of appointment Cognition/Sensorium: memory, attention, language, and fund of knowledge intact  Insight: good Judgment: good  Structured Assessment Results: The Patient Health Questionnaire-9 (PHQ-9) is a  self-report measure that assesses symptoms and severity of depression over the course of the last two weeks. Alison Best  obtained a score of 4 suggesting minimal depression. Alison Best finds the endorsed symptoms to be somewhat difficult. Decreased interest 0  Down, depressed, hopeless 1  Altered sleeping 0  Tired, decreased energy 1  Change in appetite 2  Feeling bad or failure about yourself 0  Trouble concentrating 0  Moving slowly or fidgety/restless 0  Suicidal thoughts 0  PHQ-9 Score 4    The Generalized Anxiety Disorder-7 (GAD-7) is a brief self-report measure that assesses symptoms of anxiety over the course of the last two weeks. Alison Best obtained a score of 6 suggesting mild anxiety. Alison Best finds the endorsed symptoms to be somewhat difficult. Nervous, anxious, on edge 3  Control/stop worrying 0  Worrying too much- different things 1  Trouble relaxing 0  Restless 0  Easily annoyed or irritable 1  Afraid-awful might happen 1  GAD-7 Score 6   Interventions:  Conducted a brief chart review Verbal administration of PHQ-9 and GAD-7 for symptom monitoring Provided empathic reflections and validation Reviewed content from the previous session Psychoeducation provided regarding pleasurable activities Provided positive reinforcement Employed supportive psychotherapy interventions to facilitate reduced distress, and to improve coping skills with identified stressors Employed supportive psychotherapy interventions to reduce distress associated with COVID-19  DSM-5 Diagnosis: 311 (F32.8) Other Specified Depressive Disorder, Emotional Eating Behaviors  Treatment Goal & Progress: During the initial appointment with this provider, the following treatment goal was established: decrease emotional eating. Alison Best has demonstrated progress in her goal as evidenced by increased awareness of hunger patterns and ability to identify triggers for emotional eating. She demonstrated willingness to engage in pleasurable  activities to assist with coping.  Plan: Alison Best continues to appear able and willing to participate as evidenced by engagement in reciprocal conversation, and asking questions for clarification as appropriate. The next appointment will be scheduled in two weeks, which will be via [employer]. Once this provider's office resumes in-person appointments, Alison Best will be notified. The next session will focus on reviewing pleasurable activities and the introduction of mindfulness.

## 2019-03-21 ENCOUNTER — Other Ambulatory Visit: Payer: Self-pay

## 2019-03-21 NOTE — Telephone Encounter (Signed)
Dr. Sarajane Jews ---  Check out the attachment.  It is amazing.

## 2019-03-22 ENCOUNTER — Ambulatory Visit (INDEPENDENT_AMBULATORY_CARE_PROVIDER_SITE_OTHER): Payer: BLUE CROSS/BLUE SHIELD | Admitting: Bariatrics

## 2019-03-23 ENCOUNTER — Ambulatory Visit: Payer: BLUE CROSS/BLUE SHIELD | Admitting: Internal Medicine

## 2019-03-23 ENCOUNTER — Other Ambulatory Visit: Payer: Self-pay

## 2019-03-23 ENCOUNTER — Encounter: Payer: Self-pay | Admitting: Internal Medicine

## 2019-03-23 VITALS — BP 128/78 | HR 71 | Ht 60.0 in | Wt 231.0 lb

## 2019-03-23 DIAGNOSIS — R7303 Prediabetes: Secondary | ICD-10-CM | POA: Diagnosis not present

## 2019-03-23 DIAGNOSIS — E0580 Other thyrotoxicosis without thyrotoxic crisis or storm: Secondary | ICD-10-CM

## 2019-03-23 DIAGNOSIS — E042 Nontoxic multinodular goiter: Secondary | ICD-10-CM

## 2019-03-23 LAB — LIPID PANEL
Cholesterol: 313 mg/dL — ABNORMAL HIGH (ref 0–200)
HDL: 24 mg/dL — ABNORMAL LOW (ref >39.00)
Total CHOL/HDL Ratio: 13
Triglycerides: 488 mg/dL — ABNORMAL HIGH (ref 0.0–149.0)

## 2019-03-23 LAB — POCT GLYCOSYLATED HEMOGLOBIN (HGB A1C): Hemoglobin A1C: 6.2 % — AB (ref 4.0–5.6)

## 2019-03-23 LAB — T3, FREE: T3, Free: 2.9 pg/mL (ref 2.3–4.2)

## 2019-03-23 LAB — T4, FREE: Free T4: 0.77 ng/dL (ref 0.60–1.60)

## 2019-03-23 LAB — LDL CHOLESTEROL, DIRECT: Direct LDL: 64 mg/dL

## 2019-03-23 LAB — TSH: TSH: 2.26 u[IU]/mL (ref 0.35–4.50)

## 2019-03-23 MED ORDER — GLUCOSE BLOOD VI STRP: ORAL_STRIP | 12 refills | Status: DC

## 2019-03-23 MED ORDER — LANCETS THIN MISC: 0 refills | Status: DC

## 2019-03-23 MED ORDER — CONTOUR NEXT ONE KIT: 1.0000 | PACK | Freq: Once | 0 refills | Status: AC

## 2019-03-23 NOTE — Patient Instructions (Addendum)
Please start checking sugars 1x a day or every other day, rotating check times.  Continue: - Metformin 500 mg 2x a day  Targets: - before meals: 80-130 (for you: 80-120) - after meals: <180 (for you: <150)  Please stop at the lab.  Please come back for a follow-up appointment in 6 months.

## 2019-03-23 NOTE — Progress Notes (Signed)
Patient ID: Alison Best [facility], female   DOB: 04-02-1956, 63 y.o.   MRN: [id number]   HPI  Alison Best is a 63 y.o.-year-old female, returning for f/u for subclinical thyrotoxicosis and MNG and also prediabetes. Last visit 6 months ago.  Subclinical thyrotoxicosis: -Patient with mild Graves' ds.  We started methimazole 09/2016.  She is currently on 2.5 mg daily.   Reviewed patient's TFTs: Lab Results  Component Value Date   TSH 2.44 09/20/2018   TSH 4.06 11/09/2017   TSH 10.28 (H) 09/30/2017   TSH 1.74 04/01/2017   TSH 1.42 02/09/2017   TSH 0.06 (L) 10/01/2016   TSH 0.88 05/12/2016   TSH 4.46 02/26/2016   TSH 0.60 12/29/2015   TSH 0.13 (L) 08/06/2015   FREET4 0.82 09/20/2018   FREET4 0.81 11/09/2017   FREET4 0.61 09/30/2017   FREET4 0.70 04/01/2017   FREET4 0.62 02/09/2017   FREET4 1.18 10/01/2016   FREET4 0.66 05/12/2016   FREET4 0.50 (L) 02/26/2016   FREET4 0.96 12/29/2015   FREET4 0.49 (L) 08/06/2015   Lab Results  Component Value Date   TSI 111 06/18/2014   07/10/2014 Uptake and scan: The 24 hr uptake by the thyroid gland is 28.5%. Normal 24 hr uptakeis 10-30 %. On thyroid imaging, the thyroid activity appears mildly heterogeneous in the right lobe with a possible cold nodule inferiorly. The left lobe activity appears homogeneous.   Thyroid nodules:  - dx'ed 06/2014  Reviewed previous ultrasound reports that showed several bilateral thyroid nodules, with a right dominant 1.5 x 1.1 x 1.3 cm nodule.  We performed another thyroid ultrasound on 10/24/2018, which showed dissolution of the previous thyroid nodules.  The thyroid gland appears heterogeneous (please see assessment section).  Pt denies: - feeling nodules in neck - hoarseness - dysphagia - choking - SOB with lying down  Prediabetes:  Reviewed latest HbA1c levels: Lab Results  Component Value Date   HGBA1C 5.7 (A) 09/20/2018   HGBA1C 5.5 09/30/2017   HGBA1C 6.3 04/01/2017    She continues on metformin 500 mg twice a day.  No GI symptoms.  Latest kidney tests were normal: Lab Results  Component Value Date   BUN 9 01/11/2019   BUN 12 11/16/2017   CREATININE 0.68 01/11/2019   CREATININE 0.71 11/16/2017   She has a history of hypertriglyceridemia: Lab Results  Component Value Date   CHOL 270 (H) 11/16/2017   HDL 22.20 (L) 11/16/2017   LDLCALC 93 11/02/2011   LDLDIRECT 76.0 11/16/2017   TRIG (H) 11/16/2017    462.0 Triglyceride is over 400; calculations on Lipids are invalid.   CHOLHDL 12 11/16/2017   She cut out alcohol after the above results returned.  She is seen in the [facility].  She had colon resection in summer 2016, after an episode of diverticulitis >> she is s/p colonostomy closure.  ROS: Constitutional: + weight gain/+ weight loss, no fatigue, no subjective hyperthermia, no subjective hypothermia Eyes: no blurry vision, no xerophthalmia ENT: no sore throat, + see HPI Cardiovascular: no CP/no SOB/no palpitations/no leg swelling Respiratory: no cough/no SOB/no wheezing Gastrointestinal: no N/no V/no D/no C/no acid reflux Musculoskeletal: no muscle aches/no joint aches Skin: no rashes, no hair loss Neurological: no tremors/no numbness/no tingling/no dizziness  I reviewed pt's medications, allergies, PMH, social hx, family hx, and changes were documented in the history of present illness. Otherwise, unchanged from my initial visit note.  Past Medical History:  Diagnosis Date  . Anal fissure   .  Arthritis   . Bursitis   . Constipation   . COPD (chronic obstructive pulmonary disease) ([place])   . Diabetes mellitus without complication ([place])    sees Dr. Cruzita Lederer   . Diverticulitis of colon 02/2015  . Ectopic pregnancy   . Emphysema of lung ([place])   . Frozen shoulder   . Heart murmur   . Hyperlipidemia   . Hyperthyroidism   . Infertility, female   . Irregular heartbeat   . Joint pain   . Obesity   . Osteoarthritis    . Sleep apnea   . Swallowing difficulty   . Thyroid disease    hyperthyroid   Past Surgical History:  Procedure Laterality Date  . APPENDECTOMY    . CESAREAN SECTION    . COLONOSCOPY  09-10-10   per Dr. Acquanetta Sit, diverticulosis of descending colon and sigmoid, internal hemorrhoids, repeat in 10 yrs  . COLOSTOMY N/A 02/20/2015   Procedure: COLOSTOMY;  Surgeon: Jackolyn Confer, MD;  Location: [facility];  Service: General;  Laterality: N/A;  . COLOSTOMY REVISION  02/20/2015   Procedure: Sigmoid Colectomy;  Surgeon: Jackolyn Confer, MD;  Location: [facility];  Service: General;;  . CYSTOSCOPY N/A 02/20/2015   Procedure: Consuela Mimes;  Surgeon: Kathie Rhodes, MD;  Location: [facility];  Service: Urology;  Laterality: N/A;  . DILATION AND CURETTAGE OF UTERUS     x3  . ILEO LOOP COLOSTOMY CLOSURE N/A 09/26/2015   Procedure: LAPAROSCOPIC LYSIS OF ADHESIONS, COLOSTOMY CLOSURE;  Surgeon: Jackolyn Confer, MD;  Location: [facility];  Service: General;  Laterality: N/A;  . LAPAROTOMY N/A 02/20/2015   Procedure: Emergency EXPLORATORY and drainiage of interabdominal abcesses;  Surgeon: Jackolyn Confer, MD;  Location: [facility];  Service: General;  Laterality: N/A;  . SALPINGOOPHORECTOMY Left 02/20/2015   Procedure: SALPINGO OOPHORECTOMY;  Surgeon: Jackolyn Confer, MD;  Location: [facility];  Service: General;  Laterality: Left;  . TONSILLECTOMY  1980   Social History   Socioeconomic History  . Marital status: Married    Spouse name: Not on file  . Number of children: 1  . Years of education: Not on file  . Highest education level: Not on file  Occupational History  . Occupation: Equities trader at [employer]: [place]  . Financial resource strain: Not on file  . Food insecurity:    Worry: Not on file    Inability: Not on file  . Transportation needs:    Medical: Not on file    Non-medical: Not on file  Tobacco Use  . Smoking status: Current Every Day Smoker     Packs/day: 0.25    Years: 40.00    Pack years: 10.00    Types: Cigarettes  . Smokeless tobacco: Never Used  . Tobacco comment: less than 1/2 pack a day  Substance and Sexual Activity  . Alcohol use: Yes    Alcohol/week: 0.0 standard drinks    Comment: occ  . Drug use: No  . Sexual activity: Yes    Partners: Male    Comment: 1ST intercourse- 2, partners- 72,  married x 32 yrs   Lifestyle  . Physical activity:    Days per week: Not on file    Minutes per session: Not on file  . Stress: Not on file  Relationships  . Social connections:    Talks on phone: Not on file    Gets together: Not on file    Attends religious service: Not on file  Active member of club or organization: Not on file    Attends meetings of clubs or organizations: Not on file    Relationship status: Not on file  . Intimate partner violence:    Fear of current or ex partner: Not on file    Emotionally abused: Not on file    Physically abused: Not on file    Forced sexual activity: Not on file  Other Topics Concern  . Not on file  Social History Narrative  . Not on file   Current Outpatient Medications on File Prior to Visit  Medication Sig Dispense Refill  . acetaminophen (TYLENOL) 325 MG tablet Take 2 tablets (650 mg total) by mouth every 6 (six) hours as needed for moderate pain or headache.    . albuterol (PROVENTIL HFA;VENTOLIN HFA) 108 (90 Base) MCG/ACT inhaler INHALE 2 PUFFS INTO THE LUNGS EVERY 4 HOURS AS NEEDED FOR WHEEZING 8.5 g 6  . aspirin 81 MG tablet Take 81 mg by mouth daily.    . benzonatate (TESSALON PERLES) 100 MG capsule Take 1 capsule (100 mg total) by mouth 3 (three) times daily as needed. 20 capsule 0  . cefUROXime (CEFTIN) 500 MG tablet Take 1 tablet (500 mg total) by mouth 2 (two) times daily with a meal. 20 tablet 0  . Coenzyme Q10 (CO Q 10 PO) Take 1 capsule by mouth daily.    . diclofenac (VOLTAREN) 75 MG EC tablet TAKE 1 TABLET(75 MG) BY MOUTH TWICE DAILY 60 tablet 11  .  diltiazem (CARTIA XT) 120 MG 24 hr capsule Take 1 capsule (120 mg total) by mouth daily. 90 capsule 3  . fluticasone (FLONASE) 50 MCG/ACT nasal spray Place 2 sprays into both nostrils daily. 16 g 6  . furosemide (LASIX) 20 MG tablet TAKE 1 TABLET BY MOUTH DAILY AS NEEDED FOR FLUID RETENTION 30 tablet 2  . metFORMIN (GLUCOPHAGE) 500 MG tablet TAKE 1 TABLET(500 MG) BY MOUTH TWICE DAILY WITH A MEAL 180 tablet 3  . methimazole (TAPAZOLE) 5 MG tablet TAKE 1/2 TABLET( 2.5 MG) BY MOUTH DAILY 135 tablet 0  . metroNIDAZOLE (METROGEL) 1 % gel APPLY EXTERNALLY TO THE AFFECTED AREA DAILY 60 g 11  . Multiple Vitamins-Minerals (MULTIVITAMIN WITH MINERALS) tablet Take 1 tablet by mouth daily.    . multivitamin-lutein (OCUVITE-LUTEIN) CAPS capsule Take 1 capsule by mouth daily.    . Omega-3 Fatty Acids (FISH OIL PO) Take 1 capsule by mouth daily.    . TURMERIC PO Take 1 tablet by mouth daily.    . Vitamin D, Ergocalciferol, (DRISDOL) 1.25 MG (50000 UT) CAPS capsule Take 1 capsule (50,000 Units total) by mouth every 7 (seven) days. 4 capsule 0   No current facility-administered medications on file prior to visit.    Allergies  Allergen Reactions  . Levofloxacin In D5w Other (See Comments)    Achilles tendon weakness  . Mangifera Indica Anaphylaxis  . Mango Flavor Swelling  . Bupropion Nausea And Vomiting  . Levaquin [Levofloxacin] Other (See Comments)    Severe tendonitis Tolerates Cipro   Family History  Problem Relation Age of Onset  . Breast cancer Mother   . High blood pressure Mother   . Kidney disease Mother   . Sleep apnea Mother   . Obesity Mother   . CAD Father        CABG  . Skin cancer Father   . Lung cancer Father   . Diverticulitis Father   . Colon polyps Father   .  High blood pressure Father   . Stroke Father   . Heart disease Father   . Sleep apnea Father   . COPD Other   . Cancer Other   . Heart disease Other   . Stroke Other   . Allergies Brother   . Breast cancer  Maternal Grandmother   . Heart attack Maternal Grandfather   . Heart attack Paternal Grandfather   . Hypertension Neg Hx    PE: LMP 03/22/2012   Wt Readings from Last 3 Encounters:  01/11/19 227 lb (103 kg)  09/20/18 227 lb (103 kg)  08/16/18 223 lb (101.2 kg)   Constitutional: overweight, in NAD Eyes: PERRLA, EOMI, no exophthalmos ENT: moist mucous membranes, no thyromegaly, no cervical lymphadenopathy Cardiovascular: RRR, No MRG Respiratory: CTA B Gastrointestinal: abdomen soft, NT, ND, BS+ Musculoskeletal: no deformities, strength intact in all 4 Skin: moist, warm, no rashes Neurological: no tremor with outstretched hands, DTR normal in all 4  ASSESSMENT: 1. Thyrotoxycosis, likely mild Graves' disease  07/10/2014: Uptake and scan: 24 hr uptake by the thyroid gland is 28.5%. Normal 24 hr uptake is 10-30 %. On thyroid imaging, the thyroid activity appears mildly heterogeneous in the right lobe with a possible cold nodule inferiorly. The left lobe activity appears homogeneous.  2. Multinodular goiter  07/15/2014: Thyroid U/S:  Right thyroid lobe: 4.5 x 1.9 x 1.9 cm. Multiple nodules are noted throughout the right lobe of the thyroid. There is diffuse irregular margined hypoechoic nodule measuring 16 mm in the upper pole. Multiple smaller nodules are identified.  Left thyroid lobe: 4.4 x 1.7 x 1.9 cm. Multiple hypoechoic nodules are noted within the left lobe of the thyroid. The largest of these measures 15 mm in greatest dimension lying within the lower pole  Isthmus Thickness: 0.5 cm. No nodules visualized.  Lymphadenopathy None visualized.  IMPRESSION: Multiple bilateral thyroid nodules. The dominant nodules bilateral meet criteria for percutaneous biopsy.   10/20/2017: Thyroid U/S: Location: Right; Inferior Maximum size: 1.5 cm; Other 2 dimensions: 1.1 cm x 1.3 cm Composition: solid/almost completely solid (2) Echogenicity: isoechoic (1)  IMPRESSION: Right  inferior thyroid nodule (labeled 1) meets criteria for surveillance, as designated by the newly established ACR TI-RADS criteria. Surveillance ultrasound study recommended to be performed annually up to 5 years. Given that the prior ultrasound was performed 2015, this may serve as the third annual.  10/24/2018: Thyroid U/S: Parenchymal Echotexture: Moderately heterogenous Isthmus: 0.7 cm Right lobe: 5.2 x 2.2 x 2.3 cm Left lobe: 5.5 x 2.5 x 2.0 cm  No discrete nodules are seen within the thyroid gland. Specifically, the 1.5 cm nodule previously seen in the right inferior gland is no longer discernible from the background thyroid parenchyma. It very likely represented a pseudo nodule.  IMPRESSION: 1. The previously identified right inferior thyroid nodule can not be identified on today's examination. It is either involuted over the past year, or was representative of a pseudo nodule on the prior examination. No further follow-up required. 2. Similar appearance of enlarged and diffusely heterogeneous thyroid gland.  3. Prediabetes   PLAN:  1. Patient with history of subclinical hyperthyroidism, initially with few possible thyrotoxic symptoms: Heat intolerance, palpitations, then only occasional mild palpitations.  Thyroid uptake and scan showed possible mild Graves' disease versus slight thyrotoxic nodules.  Of note, her TSI's were not elevated in the past.  We rechecked this at last visit and they were still normal.  She continues on methimazole, started in 05/2015.  She had a.  In the past off the medication and she developed atrial fibrillation.  However, afterwards, she started to take the medication, currently on 2.5 mg daily and she had no palpitations or new episodes of atrial fibrillation.  Of note, she is also on Cardizem (previously changed from metoprolol due to dizziness). At last visit she was telling me that she was occasionally increasing the dose of methimazole to 5 mg  daily if she develops neck compression symptoms: Fullness in her throat and dysphagia.  She continued on 2.5 mg daily since last visit, though - no neck compression sxs now -We will check her TFTs today and decide about the dose of methimazole after the results are back history of subclinical hyperthyroidism, initially with few possible thyrotoxic symptoms: Heat intolerance, palpitations, now only with occasional mild palpitations.  Thyroid uptake and scan showed possible mild Graves' disease versus slight thyrotoxic nodules.    2. Multinodular goiter -I reviewed the report of her latest thyroid ultrasound from 10/24/2018: Her thyroid nodules were not visible on the new ultrasound, consistent with the possibility that these have been pseudo-nodules (inflammatory nodules) which disappeared after her thyroid inflammation resolved. -No further follow-up is necessary  3. Prediabetes -At last visit, her HbA1c was 5.7%, slightly higher, but still at goal, now 6.2% - advised to start checking sugars  - q daily or qod -We continued metformin 500 mg twice a day  Patient Instructions  Please start checking sugars 1x a day or every other day, rotating check times.  Continue: - Metformin 500 mg 2x a day  Targets: - before meals: 80-130 (for you: 80-120) - after meals: <180 (for you: <150)  Please stop at the lab.  Please come back for a follow-up appointment in 6 months.  Office Visit on 03/23/2019  Component Date Value Ref Range Status  . TSH 03/23/2019 2.26  0.35 - 4.50 uIU/mL Final  . Free T4 03/23/2019 0.77  0.60 - 1.60 ng/dL Final   Comment: Specimens from patients who are undergoing biotin therapy and /or ingesting biotin supplements may contain high levels of biotin.  The higher biotin concentration in these specimens interferes with this Free T4 assay.  Specimens that contain high levels  of biotin may cause false high results for this Free T4 assay.  Please interpret results in light of  the total clinical presentation of the patient.    . T3, Free 03/23/2019 2.9  2.3 - 4.2 pg/mL Final  . Cholesterol 03/23/2019 313* 0 - 200 mg/dL Final   ATP III Classification       Desirable:  < 200 mg/dL               Borderline High:  200 - 239 mg/dL          High:  > = 240 mg/dL  . Triglycerides 03/23/2019 488.0 Triglyceride is over 400; calculations on Lipids are invalid.* 0.0 - 149.0 mg/dL Final   Normal:  <150 mg/dLBorderline High:  150 - 199 mg/dL  . HDL 03/23/2019 24.00* >39.00 mg/dL Final  . Total CHOL/HDL Ratio 03/23/2019 13   Final                  Men          Women1/2 Average Risk     3.4          3.3Average Risk          5.0          4.42X Average Risk  9.6          7.13X Average Risk          15.0          11.0                      . Hemoglobin A1C 03/23/2019 6.2* 4.0 - 5.6 % Final  . Direct LDL 03/23/2019 64.0  mg/dL Final   Optimal:  <100 mg/dLNear or Above Optimal:  100-129 mg/dLBorderline High:  130-159 mg/dLHigh:  160-189 mg/dLVery High:  >190 mg/dL   TFTs normal.  LDL at goal.  Triglycerides very high.  I would suggest fish oil 1000 mg twice a day.  Philemon Kingdom, MD PhD [facility]

## 2019-03-28 ENCOUNTER — Encounter (INDEPENDENT_AMBULATORY_CARE_PROVIDER_SITE_OTHER): Payer: Self-pay | Admitting: Bariatrics

## 2019-03-28 ENCOUNTER — Other Ambulatory Visit: Payer: Self-pay

## 2019-03-28 ENCOUNTER — Ambulatory Visit (INDEPENDENT_AMBULATORY_CARE_PROVIDER_SITE_OTHER): Payer: BLUE CROSS/BLUE SHIELD | Admitting: Bariatrics

## 2019-03-28 DIAGNOSIS — E559 Vitamin D deficiency, unspecified: Secondary | ICD-10-CM

## 2019-03-28 DIAGNOSIS — Z6841 Body Mass Index (BMI) 40.0 and over, adult: Secondary | ICD-10-CM

## 2019-03-28 DIAGNOSIS — F3289 Other specified depressive episodes: Secondary | ICD-10-CM

## 2019-03-28 DIAGNOSIS — F17200 Nicotine dependence, unspecified, uncomplicated: Secondary | ICD-10-CM | POA: Diagnosis not present

## 2019-03-28 DIAGNOSIS — G4733 Obstructive sleep apnea (adult) (pediatric): Secondary | ICD-10-CM | POA: Diagnosis not present

## 2019-03-28 MED ORDER — VITAMIN D (ERGOCALCIFEROL) 1.25 MG (50000 UNIT) PO CAPS: 50000.0000 [IU] | ORAL_CAPSULE | ORAL | 0 refills | Status: DC

## 2019-03-28 NOTE — Progress Notes (Signed)
Office: [phone]  /  Fax: [phone] TeleHealth Visit:  Alison Best has verbally consented to this TeleHealth visit today. The patient is located at home, the provider is located at the [employer] [facility] office. The participants in this visit include the listed provider and patient and all parties involved. The visit was conducted today via WebEx.  HPI:   Chief Complaint: OBESITY Alison Best is here to discuss her progress with her obesity treatment plan. She is on the Category 2 plan and is following her eating plan approximately 50 % of the time. She states she is walking 10 minutes 5 times per week. [name] states that she has lost weight (weight 226 lbs). She has been stressed. We were unable to weigh the patient today for this TeleHealth visit. She feels as if she has lost weight since her last visit. She has lost 1 lb since starting treatment with [name].  Vitamin D deficiency Alison Best has a diagnosis of vitamin D deficiency. She is currently taking vit D and denies nausea, vomiting or muscle weakness.  OSA (obstructive sleep apnea) Alison Best has a diagnosis of obstructive sleep apnea and she uses CPAP. [name] reports restful sleep.  Depression with emotional eating behaviors Alison Best is struggles with emotional eating and using food for comfort to the extent that it is negatively impacting her health. She often snacks when she is not hungry. Alison Best sometimes feels she is out of control and then feels guilty that she made poor food choices. 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 She has been working on behavior modification techniques to help reduce her emotional eating and has been somewhat successful. She shows no sign of suicidal or homicidal ideations.  Smoker Alison Best is a smoker and she is stable currently.  ASSESSMENT AND PLAN:  Vitamin D deficiency - Plan: Vitamin D, Ergocalciferol, (DRISDOL) 1.25 MG (50000 UT) CAPS capsule  OSA (obstructive sleep apnea)  Smoker   Other depression - with emotional eating  Class 3 severe obesity with serious comorbidity and body mass index (BMI) of 40.0 to 44.9 in adult, unspecified obesity type ([place])  PLAN:  Vitamin D Deficiency [name] was informed that low vitamin D levels contributes to fatigue and are associated with obesity, breast, and colon cancer. She agrees to continue to take prescription Vit D @50 ,000 IU every week #4 with no refills and will follow up for routine testing of vitamin D, at least 2-3 times per year. She was informed of the risk of over-replacement of vitamin D and agrees to not increase her dose unless she discusses this with [name] first. [name] agrees to follow up as directed.  OSA (obstructive sleep apnea) [name] will continue to wear CPAP. She will follow up with our clinic at the agreed upon time.  Depression with Emotional Eating Behaviors We discussed behavior modification techniques today to help Alison Best deal with her emotional eating and depression. She will continue with Dr. Mallie Mussel (bariatric psychologist) and follow up as directed.  Smoker [name] will continue to work on strategies to decrease smoking.  Obesity Alison Best is currently in the action stage of change. As such, her goal is to continue with weight loss efforts She has agreed to follow the Category 2 plan Alison Best has been instructed to work up to a goal of 150 minutes of combined cardio and strengthening exercise per week for weight loss and overall health benefits. We discussed the following Behavioral Modification Strategies today: no skipping meals, keeping healthy foods in the home,  increasing lean protein intake, decreasing simple carbohydrates, increasing vegetables, decrease eating out and work on meal planning and easy cooking plans Alison Best will weigh herself at home until she returns to the office. She will get rid of all "bad foods" and increase more fruits and vegetables  Alison Best has agreed to follow up with our clinic in 2 weeks. She was  informed of the importance of frequent follow up visits to maximize her success with intensive lifestyle modifications for her multiple health conditions.  ALLERGIES: Allergies  Allergen Reactions  . Levofloxacin In D5w Other (See Comments)    Achilles tendon weakness  . Mangifera Indica Anaphylaxis  . Mango Flavor Swelling  . Bupropion Nausea And Vomiting  . Levaquin [Levofloxacin] Other (See Comments)    Severe tendonitis Tolerates Cipro    MEDICATIONS: Current Outpatient Medications on File Prior to Visit  Medication Sig Dispense Refill  . acetaminophen (TYLENOL) 325 MG tablet Take 2 tablets (650 mg total) by mouth every 6 (six) hours as needed for moderate pain or headache.    . albuterol (PROVENTIL HFA;VENTOLIN HFA) 108 (90 Base) MCG/ACT inhaler INHALE 2 PUFFS INTO THE LUNGS EVERY 4 HOURS AS NEEDED FOR WHEEZING 8.5 g 6  . aspirin 81 MG tablet Take 81 mg by mouth daily.    . Coenzyme Q10 (CO Q 10 PO) Take 1 capsule by mouth daily.    Marland Kitchen diltiazem (CARTIA XT) 120 MG 24 hr capsule Take 1 capsule (120 mg total) by mouth daily. 90 capsule 3  . fluticasone (FLONASE) 50 MCG/ACT nasal spray Place 2 sprays into both nostrils daily. 16 g 6  . furosemide (LASIX) 20 MG tablet TAKE 1 TABLET BY MOUTH DAILY AS NEEDED FOR FLUID RETENTION 30 tablet 2  . glucose blood (CONTOUR NEXT TEST) test strip Use to check blood sugar once a day. 100 each 12  . Lancets Thin MISC Use to check blood sugar once a day. 100 each 0  . metFORMIN (GLUCOPHAGE) 500 MG tablet TAKE 1 TABLET(500 MG) BY MOUTH TWICE DAILY WITH A MEAL 180 tablet 3  . methimazole (TAPAZOLE) 5 MG tablet TAKE 1/2 TABLET( 2.5 MG) BY MOUTH DAILY 135 tablet 0  . metroNIDAZOLE (METROGEL) 1 % gel APPLY EXTERNALLY TO THE AFFECTED AREA DAILY 60 g 11  . Multiple Vitamins-Minerals (MULTIVITAMIN WITH MINERALS) tablet Take 1 tablet by mouth daily.    . multivitamin-lutein (OCUVITE-LUTEIN) CAPS capsule Take 1 capsule by mouth daily.    . Omega-3 Fatty Acids  (FISH OIL PO) Take 1 capsule by mouth daily.    . TURMERIC PO Take 1 tablet by mouth daily.    . cefUROXime (CEFTIN) 500 MG tablet Take 1 tablet (500 mg total) by mouth 2 (two) times daily with a meal. (Patient not taking: Reported on 03/28/2019) 20 tablet 0  . diclofenac (VOLTAREN) 75 MG EC tablet TAKE 1 TABLET(75 MG) BY MOUTH TWICE DAILY (Patient not taking: Reported on 03/28/2019) 60 tablet 11   No current facility-administered medications on file prior to visit.     PAST MEDICAL HISTORY: Past Medical History:  Diagnosis Date  . Anal fissure   . Arthritis   . Bursitis   . Constipation   . COPD (chronic obstructive pulmonary disease) ([place])   . Diabetes mellitus without complication ([place])    sees Dr. Cruzita Lederer   . Diverticulitis of colon 02/2015  . Ectopic pregnancy   . Emphysema of lung ([place])   . Frozen shoulder   . Heart murmur   . Hyperlipidemia   .  Hyperthyroidism   . Infertility, female   . Irregular heartbeat   . Joint pain   . Obesity   . Osteoarthritis   . Sleep apnea   . Swallowing difficulty   . Thyroid disease    hyperthyroid    PAST SURGICAL HISTORY: Past Surgical History:  Procedure Laterality Date  . APPENDECTOMY    . CESAREAN SECTION    . COLONOSCOPY  09-10-10   per Dr. Acquanetta Sit, diverticulosis of descending colon and sigmoid, internal hemorrhoids, repeat in 10 yrs  . COLOSTOMY N/A 02/20/2015   Procedure: COLOSTOMY;  Surgeon: Jackolyn Confer, MD;  Location: [facility];  Service: General;  Laterality: N/A;  . COLOSTOMY REVISION  02/20/2015   Procedure: Sigmoid Colectomy;  Surgeon: Jackolyn Confer, MD;  Location: [facility];  Service: General;;  . CYSTOSCOPY N/A 02/20/2015   Procedure: Consuela Mimes;  Surgeon: Kathie Rhodes, MD;  Location: [facility];  Service: Urology;  Laterality: N/A;  . DILATION AND CURETTAGE OF UTERUS     x3  . ILEO LOOP COLOSTOMY CLOSURE N/A 09/26/2015   Procedure: LAPAROSCOPIC LYSIS OF ADHESIONS, COLOSTOMY CLOSURE;  Surgeon: Jackolyn Confer, MD;   Location: [facility];  Service: General;  Laterality: N/A;  . LAPAROTOMY N/A 02/20/2015   Procedure: Emergency EXPLORATORY and drainiage of interabdominal abcesses;  Surgeon: Jackolyn Confer, MD;  Location: [facility];  Service: General;  Laterality: N/A;  . SALPINGOOPHORECTOMY Left 02/20/2015   Procedure: SALPINGO OOPHORECTOMY;  Surgeon: Jackolyn Confer, MD;  Location: [facility];  Service: General;  Laterality: Left;  . TONSILLECTOMY  1980    SOCIAL HISTORY: Social History   Tobacco Use  . Smoking status: Current Every Day Smoker    Packs/day: 0.25    Years: 40.00    Pack years: 10.00    Types: Cigarettes  . Smokeless tobacco: Never Used  . Tobacco comment: less than 1/2 pack a day  Substance Use Topics  . Alcohol use: Yes    Alcohol/week: 0.0 standard drinks    Comment: occ  . Drug use: No    FAMILY HISTORY: Family History  Problem Relation Age of Onset  . Breast cancer Mother   . High blood pressure Mother   . Kidney disease Mother   . Sleep apnea Mother   . Obesity Mother   . CAD Father        CABG  . Skin cancer Father   . Lung cancer Father   . Diverticulitis Father   . Colon polyps Father   . High blood pressure Father   . Stroke Father   . Heart disease Father   . Sleep apnea Father   . COPD Other   . Cancer Other   . Heart disease Other   . Stroke Other   . Allergies Brother   . Breast cancer Maternal Grandmother   . Heart attack Maternal Grandfather   . Heart attack Paternal Grandfather   . Hypertension Neg Hx     ROS: Review of Systems  Constitutional: Positive for weight loss.  Gastrointestinal: Negative for nausea and vomiting.  Musculoskeletal:       Negative for muscle weakness  Psychiatric/Behavioral: Positive for depression. Negative for suicidal ideas.       + Stress    PHYSICAL EXAM: Pt in no acute distress  RECENT LABS AND TESTS: BMET    Component Value Date/Time   NA 142 01/11/2019 1136   K 4.4 01/11/2019 1136   CL 102 01/11/2019 1136    CO2 22 01/11/2019 1136  GLUCOSE 87 01/11/2019 1136   GLUCOSE 89 11/16/2017 1655   BUN 9 01/11/2019 1136   CREATININE 0.68 01/11/2019 1136   CREATININE 0.68 02/26/2016 1654   CALCIUM 9.5 01/11/2019 1136   GFRNONAA 95 01/11/2019 1136   GFRNONAA >89 02/26/2016 1654   GFRAA 109 01/11/2019 1136   GFRAA >89 02/26/2016 1654   Lab Results  Component Value Date   HGBA1C 6.2 (A) 03/23/2019   HGBA1C 5.7 (A) 09/20/2018   HGBA1C 5.5 09/30/2017   HGBA1C 6.3 04/01/2017   HGBA1C 6.2 10/01/2016   Lab Results  Component Value Date   INSULIN 21.2 01/11/2019   CBC    Component Value Date/Time   WBC 9.5 11/16/2017 1655   RBC 4.69 11/16/2017 1655   HGB 14.6 11/16/2017 1655   HCT 43.4 11/16/2017 1655   PLT 288.0 11/16/2017 1655   MCV 92.5 11/16/2017 1655   MCH 30.1 10/07/2015 0546   MCHC 33.5 11/16/2017 1655   RDW 15.3 11/16/2017 1655   LYMPHSABS 3.1 11/16/2017 1655   MONOABS 0.6 11/16/2017 1655   EOSABS 0.1 11/16/2017 1655   BASOSABS 0.1 11/16/2017 1655   Iron/TIBC/Ferritin/ %Sat No results found for: IRON, TIBC, FERRITIN, IRONPCTSAT Lipid Panel     Component Value Date/Time   CHOL 313 (H) 03/23/2019 1005   TRIG (H) 03/23/2019 1005    488.0 Triglyceride is over 400; calculations on Lipids are invalid.   HDL 24.00 (L) 03/23/2019 1005   CHOLHDL 13 03/23/2019 1005   VLDL 56.8 (H) 05/23/2014 0933   LDLCALC 93 11/02/2011 0947   LDLDIRECT 64.0 03/23/2019 1005   Hepatic Function Panel     Component Value Date/Time   PROT 7.0 01/11/2019 1136   ALBUMIN 4.6 01/11/2019 1136   AST 23 01/11/2019 1136   ALT 25 01/11/2019 1136   ALKPHOS 84 01/11/2019 1136   BILITOT 0.3 01/11/2019 1136   BILIDIR 0.1 11/16/2017 1655   IBILI 0.6 02/25/2015 0928      Component Value Date/Time   TSH 2.26 03/23/2019 1005   TSH 2.44 09/20/2018 1118   TSH 4.06 11/09/2017 1557    Results for SHAYLI, ALTEMOSE (MRN [id number]) as of 03/28/2019 15:18  Ref. Range 01/11/2019 11:36  Vitamin D,  25-Hydroxy Latest Ref Range: 30.0 - 100.0 ng/mL 27.3 (L)    I, Doreene Nest, am acting as Location manager for [employer]. Owens Shark, DO  I have reviewed the above documentation for accuracy and completeness, and I agree with the above. -Jearld Lesch, DO

## 2019-04-03 ENCOUNTER — Other Ambulatory Visit: Payer: Self-pay

## 2019-04-03 ENCOUNTER — Ambulatory Visit (INDEPENDENT_AMBULATORY_CARE_PROVIDER_SITE_OTHER): Payer: BLUE CROSS/BLUE SHIELD | Admitting: Psychology

## 2019-04-03 DIAGNOSIS — F3289 Other specified depressive episodes: Secondary | ICD-10-CM

## 2019-04-03 NOTE — Progress Notes (Signed)
Office: [phone]  /  Fax: [phone]    Date: April 03, 2019    Appointment Start Time: 2:03pm Duration: 27 minutes Provider: Glennie Isle, Psy.D. Type of Session: Individual Therapy  Location of Patient: Home Location of Provider: Provider's Home Type of Contact: Telepsychological Visit via [employer] WebEx   Session Content: Alison Best is a 63 y.o. female presenting via [place] for a follow-up appointment to address the previously established treatment goal of decreasing emotional eating. Of note, this provier called Alison Best at 2:01pm to assist with connecting to today's appointment. She noted she was unable to join; therefore, the e-mail with the link for today's appointment was re-sent. As such, today's appointment was intiaited 3 minutes late. Today's appointment was a telepsychological visit, as this provider's clinic is seeing a limited number of patients for in-person visits due to COVID-19. Therapeutic services will resume to in-person appointments once deemed appropriate. Alison Best expressed understanding regarding the rationale for telepsychological services, and provided verbal consent for today's appointment. Prior to proceeding with today's appointment, Alison Best's physical location at the time of this appointment was obtained. Alison Best reported she was at home and provided the address. In the event of technical difficulties, Alison Best shared a phone number she could be reached at. Alison Best and this provider participated in today's telepsychological service. Also, Alison Best denied anyone else being present in the room or on the WebEx appointment.  This provider conducted a brief check-in and verbally administered the PHQ-9 and GAD-7. Alison Best reported feeling "more positive" and shared she appreciated speaking with this provider. Regarding current events in the community and world, she described feeling down and noted she has engaged in "soul searching." Thoughts and feelings associated with the aforementioned were  processed, and psychoeducation regarding all or nothing thinking was provided. Moreover, Alison Best discussed focusing on self-care. More specifically, she indicated she went for a hair cut, picked out new glasses, and gave herself permission to rest. From the handout shared, Alison Best reported going for walks, planning ahead for meals with her husband, and helping her mother record personal stories. She added, "I'm feeling good about those things."  It was recommended she continue to engage in pleasurable activities. Regarding emotional eating, Alison Best reported she continues to try being mindful of her triggers and noted a few episodes of emotional eating following the recent protests. She noted she is now monitoring her blood sugar and is able to see how carbohydrates impact her blood sugar. She further shared that in the last few years she "separated" herself from what she was experiencing from a medical standpoint. As such, remainder of today's appointment focused on values. Psychoeducation regarding values was provided, including its impact on overall well-being and emotional eating. She was encouraged to explore her values between now and the next appointment to assist with establishing goals congruent to her values. Alison Best agreed. Alison Best was receptive to today's session as evidenced by openness to sharing, responsiveness to feedback, and willingness to explore her values.  Mental Status Examination:  Appearance: neat Behavior: cooperative Mood: euthymic Affect: mood congruent Speech: normal in rate, volume, and tone Eye Contact: appropriate Psychomotor Activity: appropriate Thought Process: linear, logical, and goal directed  Content/Perceptual Disturbances: denies suicidal and homicidal ideation, plan, and intent and no hallucinations, delusions, bizarre thinking or behavior reported or observed Orientation: time, person, place and purpose of appointment Cognition/Sensorium: memory, attention, language, and fund  of knowledge intact  Insight: good Judgment: good  Structured Assessment Results: The Patient Health Questionnaire-9 (PHQ-9) is a self-report measure that  assesses symptoms and severity of depression over the course of the last two weeks. Alison Best obtained a score of 5 suggesting mild depression. Alison Best finds the endorsed symptoms to be not difficult at all. Decreased interest 0  Down, depressed, hopeless 1  Altered sleeping 1  Tired, decreased energy 0  Change in appetite 1  Feeling bad or failure about yourself 0  Trouble concentrating 1  Moving slowly or fidgety/restless 0  Suicidal thoughts 0  PHQ-9 Score 5    The Generalized Anxiety Disorder-7 (GAD-7) is a brief self-report measure that assesses symptoms of anxiety over the course of the last two weeks. Alison Best obtained a score of 1 suggesting minimal anxiety. Alison Best finds the endorsed symptoms to be not difficult at all. Nervous, anxious, on edge 1  Control/stop worrying 0  Worrying too much- different things 0  Trouble relaxing 0  Restless 0  Easily annoyed or irritable 0  Afraid-awful might happen 0  GAD-7 Score 1   Interventions:  Conducted a brief chart review Verbal administration of PHQ-9 and GAD-7 for symptom monitoring Provided empathic reflections and validation Reviewed content from the previous session Processed thoughts and feelings Psychoeducation provided regarding values Provided positive reinforcement Employed supportive psychotherapy interventions to facilitate reduced distress, and to improve coping skills with identified stressors Employed acceptance and commitment interventions to emphasize mindfulness and acceptance without struggle  DSM-5 Diagnosis: 311 (F32.8) Other Specified Depressive Disorder, Emotional Eating Behaviors  Treatment Goal & Progress: During the initial appointment with this provider, the following treatment goal was established: decrease emotional eating. Alison Best has demonstrated progress in her  goal as evidenced by increased awareness of hunger patterns and triggers for emotional eating. Despite recent episodes of emotional eating, Alison Best described increased awareness of her triggers and continues to demonstrate willingness to engage in learned skills.   Plan: Maziah continues to appear able and willing to participate as evidenced by engagement in reciprocal conversation, and asking questions for clarification as appropriate. The next appointment will be scheduled in two weeks, which will be via [employer]. Once this provider's office resumes in-person appointments and it is deemed appropriate, [place] will be notified. The next session will focus on reviewing values and the introduction of mindfulness, as it was not introduced today based on Sarha's presenting concerns.

## 2019-04-05 ENCOUNTER — Encounter: Payer: Self-pay | Admitting: Family Medicine

## 2019-04-05 NOTE — Telephone Encounter (Signed)
Dr. Fry please advise. Thanks  

## 2019-04-05 NOTE — Telephone Encounter (Signed)
It would probably be a good idea to test again at some point, but I would wait until we have a better test available (more sensitivity and specificity) before we do it. Not sure when that will be

## 2019-04-09 ENCOUNTER — Other Ambulatory Visit: Payer: Self-pay | Admitting: Family Medicine

## 2019-04-09 ENCOUNTER — Other Ambulatory Visit: Payer: Self-pay | Admitting: Family

## 2019-04-09 ENCOUNTER — Other Ambulatory Visit: Payer: Self-pay | Admitting: Cardiovascular Disease

## 2019-04-09 DIAGNOSIS — J069 Acute upper respiratory infection, unspecified: Secondary | ICD-10-CM

## 2019-04-10 ENCOUNTER — Ambulatory Visit (INDEPENDENT_AMBULATORY_CARE_PROVIDER_SITE_OTHER): Payer: BLUE CROSS/BLUE SHIELD | Admitting: Bariatrics

## 2019-04-10 ENCOUNTER — Encounter (INDEPENDENT_AMBULATORY_CARE_PROVIDER_SITE_OTHER): Payer: Self-pay | Admitting: Bariatrics

## 2019-04-10 DIAGNOSIS — E781 Pure hyperglyceridemia: Secondary | ICD-10-CM | POA: Diagnosis not present

## 2019-04-10 DIAGNOSIS — E66813 Obesity, class 3: Secondary | ICD-10-CM

## 2019-04-10 DIAGNOSIS — R7303 Prediabetes: Secondary | ICD-10-CM

## 2019-04-10 DIAGNOSIS — F3289 Other specified depressive episodes: Secondary | ICD-10-CM

## 2019-04-10 DIAGNOSIS — Z6841 Body Mass Index (BMI) 40.0 and over, adult: Secondary | ICD-10-CM

## 2019-04-10 DIAGNOSIS — E559 Vitamin D deficiency, unspecified: Secondary | ICD-10-CM

## 2019-04-10 DIAGNOSIS — K5909 Other constipation: Secondary | ICD-10-CM | POA: Diagnosis not present

## 2019-04-10 MED ORDER — TROKENDI XR 50 MG PO CP24: 50.0000 mg | ORAL_CAPSULE | Freq: Every day | ORAL | 0 refills | Status: DC

## 2019-04-10 MED ORDER — POLYETHYLENE GLYCOL 3350 17 GM/SCOOP PO POWD: 17.0000 g | Freq: Every day | ORAL | 0 refills | Status: DC

## 2019-04-11 ENCOUNTER — Encounter (INDEPENDENT_AMBULATORY_CARE_PROVIDER_SITE_OTHER): Payer: Self-pay | Admitting: Bariatrics

## 2019-04-11 NOTE — Progress Notes (Signed)
Office: [phone]  /  Fax: [phone] TeleHealth Visit:  Alison Best has verbally consented to this TeleHealth visit today. The patient is located at home, the provider is located at the [employer] [facility] office. The participants in this visit include the listed provider and patient. The visit was conducted today via Webex.  HPI:   Chief Complaint: OBESITY Alison Best is here to discuss her progress with her obesity treatment plan. She is on the Category 2 plan and is following her eating plan approximately 50% of the time. She states she is exercising 0 minutes 0 times per week. Alison Best states that she has gained 1 lb (weight 228 lbs). She states that her IBS "is acting up" and that she is constipated.  We were unable to weigh the patient today for this TeleHealth visit. She feels as if she has gained 1 lb since her last visit. She has lost 0 lbs since starting treatment with [name].  Pre-Diabetes Alison Best has a diagnosis of prediabetes based on her elevated Hgb A1c and was informed this puts her at greater risk of developing diabetes. Her last A1c was reported to be 6.2 on 03/23/2019. She is taking Glucophage currently and continues to work on diet and exercise to decrease risk of diabetes. She denies nausea or hypoglycemia.  Vitamin D deficiency Alison Best has a diagnosis of Vitamin D deficiency. She is currently taking high dose Vit D and denies nausea, vomiting or muscle weakness.  Hypertriglyceridemia Alison Best triglyceride level was reported to be 488.0 on 03/23/2019.  Constipation Alison Best has a history of irritable bowel syndrome - constipation.  Depression with emotional eating behaviors Alison Best is struggling with increased stress and emotional eating and using food for comfort to the extent that it is negatively impacting her health. She often snacks when she is not hungry. Alison Best sometimes feels she is out of control and then feels guilty that she made poor food choices. She has been  working on behavior modification techniques to help reduce her emotional eating and has been somewhat successful. She shows no sign of suicidal or homicidal ideations.  Depression screen Alison Best, Alison Best 2/9 02/21/2019 01/11/2019 07/26/2017 02/05/2016 06/06/2014  Decreased Interest 0 2 0 0 0  Down, Depressed, Hopeless 1 1 0 0 0  PHQ - 2 Score 1 3 0 0 0  Altered sleeping 0 2 - - -  Tired, decreased energy 1 2 - - -  Change in appetite 1 1 - - -  Feeling bad or failure about yourself  0 1 - - -  Trouble concentrating 0 0 - - -  Moving slowly or fidgety/restless 0 2 - - -  Suicidal thoughts 0 0 - - -  PHQ-9 Score 3 11 - - -  Difficult doing work/chores - Somewhat difficult - - -   ASSESSMENT AND PLAN:  Prediabetes  Vitamin D deficiency  Hypertriglyceridemia  Other constipation - Plan: polyethylene glycol powder (GLYCOLAX/MIRALAX) 17 GM/SCOOP powder  Other depression - Plan: Topiramate ER (TROKENDI XR) 50 MG CP24  Class 3 severe obesity with serious comorbidity and body mass index (BMI) of 40.0 to 44.9 in adult, unspecified obesity type ([name])  PLAN:  Pre-Diabetes Alison Best will continue to work on weight loss, exercise, and decreasing simple carbohydrates in her diet to help decrease the risk of diabetes. We dicussed metformin including benefits and risks. She was informed that eating too many simple carbohydrates or too many calories at one sitting increases the likelihood of GI side effects. Alison Best will continue  Glucophage and follow-up with [name] as directed to monitor her progress.  Vitamin D Deficiency Alison Best was informed that low Vitamin D levels contributes to fatigue and are associated with obesity, breast, and colon cancer. She agrees to continue to take Vit D and will follow-up for routine testing of Vitamin D, at least 2-3 times per year. She was informed of the risk of over-replacement of Vitamin D and agrees to not increase her dose unless she discusses this with [name] first. Alison Best agrees to follow-up  with our clinic in 2 weeks.  Hypertriglyceridemia Alison Best was instructed to decrease carbohydrates and decrease saturated fats. Her PCP put her on Fish Oil BID and is following her.   Constipation Alison Best was informed decrease bowel movement frequency is normal while losing weight, but stools should not be hard or painful. She was advised to increase her H20 intake, work on increasing her fiber intake, and increase raw vegetable intake. High fiber foods were discussed today. She will take MiraLax daily (1 month supply given with 0 refills) and will add flax seed in her yogurt daily.  Depression with Emotional Eating Behaviors We discussed behavior modification techniques today to help Alison Best deal with her emotional eating and depression. Alison Best was given a prescription for Trokendi 50 mg 1 PO daily #30 with 0 refills and agrees to follow-up with our clinic in 2 weeks. She will continue to see Dr. Mallie Mussel, our bariatric psychologist.   Obesity Alison Best is currently in the action stage of change. As such, her goal is to continue with weight loss efforts. She has agreed to follow the Category 2 plan. Alison Best will continue to work on meal planning. She will weigh herself at home. Alison Best has been instructed to work up to a goal of 150 minutes of combined cardio and strengthening exercise per week for weight loss and overall health benefits. We discussed the following Behavioral Modification Strategies today: increasing lean protein intake, decreasing simple carbohydrates, increasing vegetables, increase H20 intake, decrease eating out, no skipping meals, work on meal planning and easy cooking plans, keeping healthy foods in the home, ways to avoid night time snacking, better snacking choices, emotional eating strategies, and planning for success.  Alison Best has agreed to follow-up with our clinic in 2 weeks. She was informed of the importance of frequent follow-up visits to maximize her success with intensive lifestyle  modifications for her multiple health conditions.  ALLERGIES: Allergies  Allergen Reactions  . Levofloxacin In D5w Other (See Comments)    Achilles tendon weakness  . Mangifera Indica Anaphylaxis  . Mango Flavor Swelling  . Bupropion Nausea And Vomiting  . Levaquin [Levofloxacin] Other (See Comments)    Severe tendonitis Tolerates Cipro    MEDICATIONS: Current Outpatient Medications on File Prior to Visit  Medication Sig Dispense Refill  . acetaminophen (TYLENOL) 325 MG tablet Take 2 tablets (650 mg total) by mouth every 6 (six) hours as needed for moderate pain or headache.    . albuterol (PROVENTIL HFA;VENTOLIN HFA) 108 (90 Base) MCG/ACT inhaler INHALE 2 PUFFS INTO THE LUNGS EVERY 4 HOURS AS NEEDED FOR WHEEZING 8.5 g 6  . aspirin 81 MG tablet Take 81 mg by mouth daily.    . cefUROXime (CEFTIN) 500 MG tablet Take 1 tablet (500 mg total) by mouth 2 (two) times daily with a meal. (Patient not taking: Reported on 03/28/2019) 20 tablet 0  . Coenzyme Q10 (CO Q 10 PO) Take 1 capsule by mouth daily.    . diclofenac (VOLTAREN) 75 MG  EC tablet TAKE 1 TABLET(75 MG) BY MOUTH TWICE DAILY (Patient not taking: Reported on 03/28/2019) 60 tablet 11  . diltiazem (CARDIZEM CD) 120 MG 24 hr capsule TAKE 1 CAPSULE(120 MG) BY MOUTH DAILY 90 capsule 3  . fluticasone (FLONASE) 50 MCG/ACT nasal spray Place 2 sprays into both nostrils daily. 16 g 6  . furosemide (LASIX) 20 MG tablet TAKE 1 TABLET BY MOUTH DAILY AS NEEDED FOR FLUID RETENTION 30 tablet 2  . glucose blood (CONTOUR NEXT TEST) test strip Use to check blood sugar once a day. 100 each 12  . Lancets Thin MISC Use to check blood sugar once a day. 100 each 0  . metFORMIN (GLUCOPHAGE) 500 MG tablet TAKE 1 TABLET(500 MG) BY MOUTH TWICE DAILY WITH A MEAL 180 tablet 3  . methimazole (TAPAZOLE) 5 MG tablet TAKE 1/2 TABLET( 2.5 MG) BY MOUTH DAILY 135 tablet 0  . metroNIDAZOLE (METROGEL) 1 % gel APPLY EXTERNALLY TO THE AFFECTED AREA DAILY 60 g 11  . Multiple  Vitamins-Minerals (MULTIVITAMIN WITH MINERALS) tablet Take 1 tablet by mouth daily.    . multivitamin-lutein (OCUVITE-LUTEIN) CAPS capsule Take 1 capsule by mouth daily.    . Omega-3 Fatty Acids (FISH OIL PO) Take 1 capsule by mouth daily.    . TURMERIC PO Take 1 tablet by mouth daily.    . Vitamin D, Ergocalciferol, (DRISDOL) 1.25 MG (50000 UT) CAPS capsule Take 1 capsule (50,000 Units total) by mouth every 7 (seven) days. 4 capsule 0   No current facility-administered medications on file prior to visit.     PAST MEDICAL HISTORY: Past Medical History:  Diagnosis Date  . Anal fissure   . Arthritis   . Bursitis   . Constipation   . COPD (chronic obstructive pulmonary disease) ([place])   . Diabetes mellitus without complication ([place])    sees Dr. Cruzita Lederer   . Diverticulitis of colon 02/2015  . Ectopic pregnancy   . Emphysema of lung ([place])   . Frozen shoulder   . Heart murmur   . Hyperlipidemia   . Hyperthyroidism   . Infertility, female   . Irregular heartbeat   . Joint pain   . Obesity   . Osteoarthritis   . Sleep apnea   . Swallowing difficulty   . Thyroid disease    hyperthyroid    PAST SURGICAL HISTORY: Past Surgical History:  Procedure Laterality Date  . APPENDECTOMY    . CESAREAN SECTION    . COLONOSCOPY  09-10-10   per Dr. Acquanetta Sit, diverticulosis of descending colon and sigmoid, internal hemorrhoids, repeat in 10 yrs  . COLOSTOMY N/A 02/20/2015   Procedure: COLOSTOMY;  Surgeon: Jackolyn Confer, MD;  Location: [facility];  Service: General;  Laterality: N/A;  . COLOSTOMY REVISION  02/20/2015   Procedure: Sigmoid Colectomy;  Surgeon: Jackolyn Confer, MD;  Location: [facility];  Service: General;;  . CYSTOSCOPY N/A 02/20/2015   Procedure: Consuela Mimes;  Surgeon: Kathie Rhodes, MD;  Location: [facility];  Service: Urology;  Laterality: N/A;  . DILATION AND CURETTAGE OF UTERUS     x3  . ILEO LOOP COLOSTOMY CLOSURE N/A 09/26/2015   Procedure: LAPAROSCOPIC LYSIS OF ADHESIONS, COLOSTOMY  CLOSURE;  Surgeon: Jackolyn Confer, MD;  Location: [facility];  Service: General;  Laterality: N/A;  . LAPAROTOMY N/A 02/20/2015   Procedure: Emergency EXPLORATORY and drainiage of interabdominal abcesses;  Surgeon: Jackolyn Confer, MD;  Location: [facility];  Service: General;  Laterality: N/A;  . SALPINGOOPHORECTOMY Left 02/20/2015   Procedure: SALPINGO OOPHORECTOMY;  Surgeon: Jackolyn Confer, MD;  Location: [facility];  Service: General;  Laterality: Left;  . TONSILLECTOMY  1980    SOCIAL HISTORY: Social History   Tobacco Use  . Smoking status: Current Every Day Smoker    Packs/day: 0.25    Years: 40.00    Pack years: 10.00    Types: Cigarettes  . Smokeless tobacco: Never Used  . Tobacco comment: less than 1/2 pack a day  Substance Use Topics  . Alcohol use: Yes    Alcohol/week: 0.0 standard drinks    Comment: occ  . Drug use: No    FAMILY HISTORY: Family History  Problem Relation Age of Onset  . Breast cancer Mother   . High blood pressure Mother   . Kidney disease Mother   . Sleep apnea Mother   . Obesity Mother   . CAD Father        CABG  . Skin cancer Father   . Lung cancer Father   . Diverticulitis Father   . Colon polyps Father   . High blood pressure Father   . Stroke Father   . Heart disease Father   . Sleep apnea Father   . COPD Other   . Cancer Other   . Heart disease Other   . Stroke Other   . Allergies Brother   . Breast cancer Maternal Grandmother   . Heart attack Maternal Grandfather   . Heart attack Paternal Grandfather   . Hypertension Neg Hx    ROS: Review of Systems  Gastrointestinal: Positive for constipation (history of IBS). Negative for nausea and vomiting.  Musculoskeletal:       Negative for muscle weakness.  Endo/Heme/Allergies:       Negative for hypoglycemia.  Psychiatric/Behavioral: Positive for depression (emotional eating). Negative for suicidal ideas.       Negative for homicidal ideas.   PHYSICAL EXAM: Pt in no acute distress   RECENT LABS AND TESTS: BMET    Component Value Date/Time   NA 142 01/11/2019 1136   K 4.4 01/11/2019 1136   CL 102 01/11/2019 1136   CO2 22 01/11/2019 1136   GLUCOSE 87 01/11/2019 1136   GLUCOSE 89 11/16/2017 1655   BUN 9 01/11/2019 1136   CREATININE 0.68 01/11/2019 1136   CREATININE 0.68 02/26/2016 1654   CALCIUM 9.5 01/11/2019 1136   GFRNONAA 95 01/11/2019 1136   GFRNONAA >89 02/26/2016 1654   GFRAA 109 01/11/2019 1136   GFRAA >89 02/26/2016 1654   Lab Results  Component Value Date   HGBA1C 6.2 (A) 03/23/2019   HGBA1C 5.7 (A) 09/20/2018   HGBA1C 5.5 09/30/2017   HGBA1C 6.3 04/01/2017   HGBA1C 6.2 10/01/2016   Lab Results  Component Value Date   INSULIN 21.2 01/11/2019   CBC    Component Value Date/Time   WBC 9.5 11/16/2017 1655   RBC 4.69 11/16/2017 1655   HGB 14.6 11/16/2017 1655   HCT 43.4 11/16/2017 1655   PLT 288.0 11/16/2017 1655   MCV 92.5 11/16/2017 1655   MCH 30.1 10/07/2015 0546   MCHC 33.5 11/16/2017 1655   RDW 15.3 11/16/2017 1655   LYMPHSABS 3.1 11/16/2017 1655   MONOABS 0.6 11/16/2017 1655   EOSABS 0.1 11/16/2017 1655   BASOSABS 0.1 11/16/2017 1655   Iron/TIBC/Ferritin/ %Sat No results found for: IRON, TIBC, FERRITIN, IRONPCTSAT Lipid Panel     Component Value Date/Time   CHOL 313 (H) 03/23/2019 1005   TRIG (H) 03/23/2019 1005    488.0 Triglyceride is  over 400; calculations on Lipids are invalid.   HDL 24.00 (L) 03/23/2019 1005   CHOLHDL 13 03/23/2019 1005   VLDL 56.8 (H) 05/23/2014 0933   LDLCALC 93 11/02/2011 0947   LDLDIRECT 64.0 03/23/2019 1005   Hepatic Function Panel     Component Value Date/Time   PROT 7.0 01/11/2019 1136   ALBUMIN 4.6 01/11/2019 1136   AST 23 01/11/2019 1136   ALT 25 01/11/2019 1136   ALKPHOS 84 01/11/2019 1136   BILITOT 0.3 01/11/2019 1136   BILIDIR 0.1 11/16/2017 1655   IBILI 0.6 02/25/2015 0928      Component Value Date/Time   TSH 2.26 03/23/2019 1005   TSH 2.44 09/20/2018 1118   TSH 4.06  11/09/2017 1557   Results for AMALA, PETION (MRN [id number]) as of 04/11/2019 08:44  Ref. Range 01/11/2019 11:36  Vitamin D, 25-Hydroxy Latest Ref Range: 30.0 - 100.0 ng/mL 27.3 (L)    I, Michaelene Song, am acting as Location manager for [employer], DO  I have reviewed the above documentation for accuracy and completeness, and I agree with the above. -Jearld Lesch, DO

## 2019-04-12 ENCOUNTER — Telehealth (INDEPENDENT_AMBULATORY_CARE_PROVIDER_SITE_OTHER): Payer: Self-pay | Admitting: Bariatrics

## 2019-04-12 NOTE — Telephone Encounter (Signed)
Patient states Environmental manager on [employer] told her that her insurance will not cover the Trokendi without prior [place].  Please advise patient.  Thank you

## 2019-04-12 NOTE — Telephone Encounter (Signed)
LM regarding prior auth.

## 2019-04-16 NOTE — Progress Notes (Signed)
Office: [phone]  /  Fax: [phone]    Date: April 17, 2019   Appointment Start Time: 2:00pm Duration: 29 minutes Provider: Glennie Isle, Psy.D. Type of Session: Individual Therapy  Location of Patient: Home Location of Provider: Provider's Home Type of Contact: Telepsychological Visit via [employer] WebEx   Session Content: Alison Best is a 63 y.o. female presenting via [place] for a follow-up appointment to address the previously established treatment goal of decreasing emotional eating. Today's appointment was a telepsychological visit, as this provider's clinic is seeing a limited number of patients for in-person visits due to COVID-19. Therapeutic services will resume to in-person appointments once deemed appropriate. Alison Best expressed understanding regarding the rationale for telepsychological services, and provided verbal consent for today's appointment. Prior to proceeding with today's appointment, Alison Best's physical location at the time of this appointment was obtained. Alison Best reported she was at home and provided the address. In the event of technical difficulties, Alison Best shared a phone number she could be reached at. Alison Best and this provider participated in today's telepsychological service. Also, Alison Best denied anyone else being present in the room or on the WebEx appointment.  This provider conducted a brief check-in and verbally administered the PHQ-9 and GAD-7. Alison Best discussed she is "doing well." She indicated a co-worker at her husband's work tested positive for COIVD-19, which has contributed to worry. Additionally, Alison Best stated Alison Best prescribed a new fiber supplement as well as Trokendi reportedly for cravings. Regarding emotional eating, Alison Best noted she is doing "better" and described a reduction in emotional eating episodes since the onset of treatment with this provider. She also discussed making better choices. Alison Best further shared feeling a reduction in anxiety symptoms.   Session  initially focused on reviewing values, which was discussed at the last appointment. Alison Best shared, "I realize that some people grow up in a family that prioritizes physical activity and that was not the family I grew up in." Nevertheless, she values physical well-being, but currently she shared a decrease in motivation as she is more focused on work. Thus, she is working toward establishing goals to increase physical activity. This provider explored what Alison Best preciously enjoyed as it relates to exercising. She recalled enjoying physical therapy and the accomplishment she felt by waking up and completing the prescribed exercises. She also enjoys walking. It was recommended she consider waking up early to walk every day and set short-term goals that assists with increasing time spent walking. She noted a plan to "schedule an appointment" with herself for each morning. Moreover, psychoeducation regarding mindfulness was provided to increase coping. A handout was provided to Alison Best with further information regarding mindfulness via e-mail, including exercises. This provider also explained the benefit of mindfulness as it relates to emotional eating and overall well-being. Alison Best was encouraged to engage in the provided exercises between now and the next appointment with this provider. Alison Best agreed. She was led through a mindfulness exercise involving her five senses. Alison Best provided verbal consent during today's appointment for this provider to send the mindfulness handout via e-mail. Furthermore, this provider discussed termination planning, including the option for a referral for longer-term therapeutic services. Given the reduction in emotional eating since the onset of treatment, the frequency of appointments will be reduced. Overall, Alison Best was receptive to today's session as evidenced by openness to sharing, responsiveness to feedback, and willingness to engage in mindfulness exercies.  Mental Status Examination:   Appearance: neat Behavior: cooperative Mood: euthymic Affect: mood congruent Speech: normal in rate, volume, and tone  Eye Contact: appropriate Psychomotor Activity: appropriate Thought Process: linear, logical, and goal directed  Content/Perceptual Disturbances: denies suicidal and homicidal ideation, plan, and intent and no hallucinations, delusions, bizarre thinking or behavior reported or observed Orientation: time, person, place and purpose of appointment Cognition/Sensorium: memory, attention, language, and fund of knowledge intact  Insight: good Judgment: good  Structured Assessment Results: The Patient Health Questionnaire-9 (PHQ-9) is a self-report measure that assesses symptoms and severity of depression over the course of the last two weeks. Alison Best obtained a score of 4 suggesting minimal depression. Alison Best finds the endorsed symptoms to be not difficult at all. Little interest or pleasure in doing things 0  Feeling down, depressed, or hopeless 1  Trouble falling or staying asleep, or sleeping too much 2  Feeling tired or having little energy 0  Poor appetite or overeating 1  Feeling bad about yourself --- or that you are a failure or have let yourself or your family down 0  Trouble concentrating on things, such as reading the newspaper or watching television 0  Moving or speaking so slowly that other people could have noticed? Or the opposite --- being so fidgety or restless that you have been moving around a lot more than usual 0  Thoughts that you would be better off dead or hurting yourself in some way 0  PHQ-9 Score 4    The Generalized Anxiety Disorder-7 (GAD-7) is a brief self-report measure that assesses symptoms of anxiety over the course of the last two weeks. Alison Best obtained a score of 1 suggesting minimal anxiety. Alison Best finds the endorsed symptoms to be somewhat difficult. Feeling nervous, anxious, on edge 1  Not being able to stop or control worrying 0  Worrying too much  about different things 0  Trouble relaxing 0  Being so restless that it's hard to sit still 0  Becoming easily annoyed or irritable 0  Feeling afraid as if something awful might happen 0  GAD-7 Score 1   Interventions:  Conducted a brief chart review Verbal administration of PHQ-9 and GAD-7 for symptom monitoring Provided empathic reflections and validation Reviewed content from the previous session Engaged patient in problem solving Psychoeducation provided regarding mindfulness Engaged patient in a mindfulness exercise Discussed termination planning Provided positive reinforcement Employed supportive psychotherapy interventions to facilitate reduced distress, and to improve coping skills with identified stressors Employed acceptance and commitment interventions to emphasize mindfulness and acceptance without struggle  DSM-5 Diagnosis: 311 (F32.8) Other Specified Depressive Disorder, Emotional Eating Behaviors  Treatment Goal & Progress: During the initial appointment with this provider, the following treatment goal was established: decrease emotional eating. Alison Best has demonstrated progress in her goal as evidenced by increased awareness of hunger patterns and triggers for emotional eating. She also discussed a reduction in emotional eating since the onset of treatment with this provider.   Plan: Alison Best continues to appear able and willing to participate as evidenced by engagement in reciprocal conversation, and asking questions for clarification as appropriate. The next appointment will be scheduled in three weeks, which will be via [employer]. Once this provider's office resumes in-person appointments and it is deemed appropriate, Alison Best will be notified. The next session will focus further on mindfulness.

## 2019-04-17 ENCOUNTER — Ambulatory Visit (INDEPENDENT_AMBULATORY_CARE_PROVIDER_SITE_OTHER): Payer: BC Managed Care – PPO | Admitting: Psychology

## 2019-04-17 ENCOUNTER — Other Ambulatory Visit: Payer: Self-pay

## 2019-04-17 DIAGNOSIS — F3289 Other specified depressive episodes: Secondary | ICD-10-CM | POA: Diagnosis not present

## 2019-04-22 ENCOUNTER — Other Ambulatory Visit (INDEPENDENT_AMBULATORY_CARE_PROVIDER_SITE_OTHER): Payer: Self-pay | Admitting: Bariatrics

## 2019-04-22 DIAGNOSIS — E559 Vitamin D deficiency, unspecified: Secondary | ICD-10-CM

## 2019-04-24 ENCOUNTER — Other Ambulatory Visit: Payer: Self-pay

## 2019-04-24 ENCOUNTER — Telehealth (INDEPENDENT_AMBULATORY_CARE_PROVIDER_SITE_OTHER): Payer: BC Managed Care – PPO | Admitting: Bariatrics

## 2019-04-24 ENCOUNTER — Encounter (INDEPENDENT_AMBULATORY_CARE_PROVIDER_SITE_OTHER): Payer: Self-pay | Admitting: Bariatrics

## 2019-04-24 DIAGNOSIS — R7303 Prediabetes: Secondary | ICD-10-CM

## 2019-04-24 DIAGNOSIS — E559 Vitamin D deficiency, unspecified: Secondary | ICD-10-CM

## 2019-04-24 DIAGNOSIS — Z6841 Body Mass Index (BMI) 40.0 and over, adult: Secondary | ICD-10-CM

## 2019-04-24 MED ORDER — TOPIRAMATE 25 MG PO TABS: 25.0000 mg | ORAL_TABLET | Freq: Two times a day (BID) | ORAL | 0 refills | Status: DC

## 2019-04-24 MED ORDER — VITAMIN D (ERGOCALCIFEROL) 1.25 MG (50000 UNIT) PO CAPS: 50000.0000 [IU] | ORAL_CAPSULE | ORAL | 0 refills | Status: DC

## 2019-04-25 NOTE — Progress Notes (Signed)
Office: [phone]  /  Fax: [phone] TeleHealth Visit:  Alison Best has verbally consented to this TeleHealth visit today. The patient is located at home, the provider is located at the [employer] [facility] office. The participants in this visit include the listed provider and patient and any and all parties involved. The visit was conducted today via WebEx.  HPI:   Chief Complaint: OBESITY Alison Best is here to discuss her progress with her obesity treatment plan. She is on the Category 2 plan and is following her eating plan approximately 30 % of the time. She states she is exercising 0 minutes 0 times per week. Alison Best states that she is up 1 pound (weight 229 lbs). We were unable to weigh the patient today for this TeleHealth visit. She feels as if she has gained weight since her last visit. She has gained 2 lbs since starting treatment with [name].  Vitamin D deficiency Alison Best has a diagnosis of vitamin D deficiency. Her last vitamin D level was at 27.3 She is currently taking vit D and denies nausea, vomiting or muscle weakness.  Pre-Diabetes Alison Best has a diagnosis of prediabetes based on her elevated Hgb A1c and was informed this puts her at greater risk of developing diabetes. Her last A1c was at 6.2 and last insulin level was at  21.2 Alison Best is taking metformin currently and she continues to work on diet and exercise to decrease risk of diabetes. She denies polyphagia.  Depression with emotional eating behaviors Alison Best is struggling with emotional eating and using food for comfort to the extent that it is negatively impacting her health. She often snacks when she is not hungry. Alison Best sometimes feels she is out of control and then feels guilty that she made poor food choices. She has been working on behavior modification techniques to help reduce her emotional eating and has been somewhat successful. She shows no sign of suicidal or homicidal ideations.  ASSESSMENT AND PLAN:   Prediabetes  Vitamin D deficiency - Plan: Vitamin D, Ergocalciferol, (DRISDOL) 1.25 MG (50000 UT) CAPS capsule  Class 3 severe obesity with serious comorbidity and body mass index (BMI) of 40.0 to 44.9 in adult, unspecified obesity type (Alison Best)  PLAN:  Vitamin D Deficiency Alison Best was informed that low vitamin D levels contributes to fatigue and are associated with obesity, breast, and colon cancer. She agrees to continue to take prescription Vit D @50 ,000 IU every week #4 with no refills and will follow up for routine testing of vitamin D, at least 2-3 times per year. She was informed of the risk of over-replacement of vitamin D and agrees to not increase her dose unless she discusses this with [name] first. [name] agrees to follow up as directed.  Pre-Diabetes Alison Best will continue to work on weight loss, increasing activity, increasing lean protein and decreasing simple carbohydrates in her diet to help decrease the risk of diabetes. She was informed that eating too many simple carbohydrates or too many calories at one sitting increases the likelihood of GI side effects. Alison Best agreed to follow up with [name] as directed to monitor her progress.  Depression with Emotional Eating Behaviors We discussed behavior modification techniques today to help Alison Best deal with her emotional eating and depression. Alison Best will continue to see Dr. Mallie Mussel.  Obesity Alison Best is currently in the action stage of change. As such, her goal is to continue with weight loss efforts She has agreed to follow the Category 2 plan Alison Best has been instructed to  work up to a goal of 150 minutes of combined cardio and strengthening exercise per week for weight loss and overall health benefits. We discussed the following Behavioral Modification Strategies today: increase H2O intake, no skipping meals, keeping healthy foods in the home, increasing lean protein intake, decreasing simple carbohydrates, increasing vegetables, decrease eating out and work on  meal planning and easy cooking plans Alison Best will continue to weigh herself at home until she returns to the office. Alison Best agrees to take Topamax 25 mg two times daily #60 with no refills  Alison Best has agreed to follow up with our clinic in 2 weeks. She was informed of the importance of frequent follow up visits to maximize her success with intensive lifestyle modifications for her multiple health conditions.  ALLERGIES: Allergies  Allergen Reactions  . Levofloxacin In D5w Other (See Comments)    Achilles tendon weakness  . Mangifera Indica Anaphylaxis  . Mango Flavor Swelling  . Bupropion Nausea And Vomiting  . Levaquin [Levofloxacin] Other (See Comments)    Severe tendonitis Tolerates Cipro    MEDICATIONS: Current Outpatient Medications on File Prior to Visit  Medication Sig Dispense Refill  . acetaminophen (TYLENOL) 325 MG tablet Take 2 tablets (650 mg total) by mouth every 6 (six) hours as needed for moderate pain or headache.    . albuterol (PROVENTIL HFA;VENTOLIN HFA) 108 (90 Base) MCG/ACT inhaler INHALE 2 PUFFS INTO THE LUNGS EVERY 4 HOURS AS NEEDED FOR WHEEZING 8.5 g 6  . aspirin 81 MG tablet Take 81 mg by mouth daily.    . cefUROXime (CEFTIN) 500 MG tablet Take 1 tablet (500 mg total) by mouth 2 (two) times daily with a meal. (Patient not taking: Reported on 03/28/2019) 20 tablet 0  . Coenzyme Q10 (CO Q 10 PO) Take 1 capsule by mouth daily.    . diclofenac (VOLTAREN) 75 MG EC tablet TAKE 1 TABLET(75 MG) BY MOUTH TWICE DAILY (Patient not taking: Reported on 03/28/2019) 60 tablet 11  . diltiazem (CARDIZEM CD) 120 MG 24 hr capsule TAKE 1 CAPSULE(120 MG) BY MOUTH DAILY 90 capsule 3  . fluticasone (FLONASE) 50 MCG/ACT nasal spray Place 2 sprays into both nostrils daily. 16 g 6  . furosemide (LASIX) 20 MG tablet TAKE 1 TABLET BY MOUTH DAILY AS NEEDED FOR FLUID RETENTION 30 tablet 2  . glucose blood (CONTOUR NEXT TEST) test strip Use to check blood sugar once a day. 100 each 12  . Lancets Thin  MISC Use to check blood sugar once a day. 100 each 0  . metFORMIN (GLUCOPHAGE) 500 MG tablet TAKE 1 TABLET(500 MG) BY MOUTH TWICE DAILY WITH A MEAL 180 tablet 3  . methimazole (TAPAZOLE) 5 MG tablet TAKE 1/2 TABLET( 2.5 MG) BY MOUTH DAILY 135 tablet 0  . metroNIDAZOLE (METROGEL) 1 % gel APPLY EXTERNALLY TO THE AFFECTED AREA DAILY 60 g 11  . Multiple Vitamins-Minerals (MULTIVITAMIN WITH MINERALS) tablet Take 1 tablet by mouth daily.    . multivitamin-lutein (OCUVITE-LUTEIN) CAPS capsule Take 1 capsule by mouth daily.    . Omega-3 Fatty Acids (FISH OIL PO) Take 1 capsule by mouth daily.    . polyethylene glycol powder (GLYCOLAX/MIRALAX) 17 GM/SCOOP powder Take 17 g by mouth daily. 3350 g 0  . TURMERIC PO Take 1 tablet by mouth daily.     No current facility-administered medications on file prior to visit.     PAST MEDICAL HISTORY: Past Medical History:  Diagnosis Date  . Anal fissure   . Arthritis   .  Bursitis   . Constipation   . COPD (chronic obstructive pulmonary disease) ([place])   . Diabetes mellitus without complication ([place])    sees Dr. Cruzita Lederer   . Diverticulitis of colon 02/2015  . Ectopic pregnancy   . Emphysema of lung ([place])   . Frozen shoulder   . Heart murmur   . Hyperlipidemia   . Hyperthyroidism   . Infertility, female   . Irregular heartbeat   . Joint pain   . Obesity   . Osteoarthritis   . Sleep apnea   . Swallowing difficulty   . Thyroid disease    hyperthyroid    PAST SURGICAL HISTORY: Past Surgical History:  Procedure Laterality Date  . APPENDECTOMY    . CESAREAN SECTION    . COLONOSCOPY  09-10-10   per Dr. Acquanetta Sit, diverticulosis of descending colon and sigmoid, internal hemorrhoids, repeat in 10 yrs  . COLOSTOMY N/A 02/20/2015   Procedure: COLOSTOMY;  Surgeon: Jackolyn Confer, MD;  Location: [facility];  Service: General;  Laterality: N/A;  . COLOSTOMY REVISION  02/20/2015   Procedure: Sigmoid Colectomy;  Surgeon: Jackolyn Confer, MD;  Location: [facility];   Service: General;;  . CYSTOSCOPY N/A 02/20/2015   Procedure: Consuela Mimes;  Surgeon: Kathie Rhodes, MD;  Location: [facility];  Service: Urology;  Laterality: N/A;  . DILATION AND CURETTAGE OF UTERUS     x3  . ILEO LOOP COLOSTOMY CLOSURE N/A 09/26/2015   Procedure: LAPAROSCOPIC LYSIS OF ADHESIONS, COLOSTOMY CLOSURE;  Surgeon: Jackolyn Confer, MD;  Location: [facility];  Service: General;  Laterality: N/A;  . LAPAROTOMY N/A 02/20/2015   Procedure: Emergency EXPLORATORY and drainiage of interabdominal abcesses;  Surgeon: Jackolyn Confer, MD;  Location: [facility];  Service: General;  Laterality: N/A;  . SALPINGOOPHORECTOMY Left 02/20/2015   Procedure: SALPINGO OOPHORECTOMY;  Surgeon: Jackolyn Confer, MD;  Location: [facility];  Service: General;  Laterality: Left;  . TONSILLECTOMY  1980    SOCIAL HISTORY: Social History   Tobacco Use  . Smoking status: Current Every Day Smoker    Packs/day: 0.25    Years: 40.00    Pack years: 10.00    Types: Cigarettes  . Smokeless tobacco: Never Used  . Tobacco comment: less than 1/2 pack a day  Substance Use Topics  . Alcohol use: Yes    Alcohol/week: 0.0 standard drinks    Comment: occ  . Drug use: No    FAMILY HISTORY: Family History  Problem Relation Age of Onset  . Breast cancer Mother   . High blood pressure Mother   . Kidney disease Mother   . Sleep apnea Mother   . Obesity Mother   . CAD Father        CABG  . Skin cancer Father   . Lung cancer Father   . Diverticulitis Father   . Colon polyps Father   . High blood pressure Father   . Stroke Father   . Heart disease Father   . Sleep apnea Father   . COPD Other   . Cancer Other   . Heart disease Other   . Stroke Other   . Allergies Brother   . Breast cancer Maternal Grandmother   . Heart attack Maternal Grandfather   . Heart attack Paternal Grandfather   . Hypertension Neg Hx     ROS: Review of Systems  Constitutional: Negative for weight loss.  Gastrointestinal: Negative for nausea and  vomiting.  Musculoskeletal:       Negative for muscle weakness  Endo/Heme/Allergies:       Negative for polyphagia  Psychiatric/Behavioral: Positive for depression. Negative for suicidal ideas.    PHYSICAL EXAM: Pt in no acute distress  RECENT LABS AND TESTS: BMET    Component Value Date/Time   NA 142 01/11/2019 1136   K 4.4 01/11/2019 1136   CL 102 01/11/2019 1136   CO2 22 01/11/2019 1136   GLUCOSE 87 01/11/2019 1136   GLUCOSE 89 11/16/2017 1655   BUN 9 01/11/2019 1136   CREATININE 0.68 01/11/2019 1136   CREATININE 0.68 02/26/2016 1654   CALCIUM 9.5 01/11/2019 1136   GFRNONAA 95 01/11/2019 1136   GFRNONAA >89 02/26/2016 1654   GFRAA 109 01/11/2019 1136   GFRAA >89 02/26/2016 1654   Lab Results  Component Value Date   HGBA1C 6.2 (A) 03/23/2019   HGBA1C 5.7 (A) 09/20/2018   HGBA1C 5.5 09/30/2017   HGBA1C 6.3 04/01/2017   HGBA1C 6.2 10/01/2016   Lab Results  Component Value Date   INSULIN 21.2 01/11/2019   CBC    Component Value Date/Time   WBC 9.5 11/16/2017 1655   RBC 4.69 11/16/2017 1655   HGB 14.6 11/16/2017 1655   HCT 43.4 11/16/2017 1655   PLT 288.0 11/16/2017 1655   MCV 92.5 11/16/2017 1655   MCH 30.1 10/07/2015 0546   MCHC 33.5 11/16/2017 1655   RDW 15.3 11/16/2017 1655   LYMPHSABS 3.1 11/16/2017 1655   MONOABS 0.6 11/16/2017 1655   EOSABS 0.1 11/16/2017 1655   BASOSABS 0.1 11/16/2017 1655   Iron/TIBC/Ferritin/ %Sat No results found for: IRON, TIBC, FERRITIN, IRONPCTSAT Lipid Panel     Component Value Date/Time   CHOL 313 (H) 03/23/2019 1005   TRIG (H) 03/23/2019 1005    488.0 Triglyceride is over 400; calculations on Lipids are invalid.   HDL 24.00 (L) 03/23/2019 1005   CHOLHDL 13 03/23/2019 1005   VLDL 56.8 (H) 05/23/2014 0933   LDLCALC 93 11/02/2011 0947   LDLDIRECT 64.0 03/23/2019 1005   Hepatic Function Panel     Component Value Date/Time   PROT 7.0 01/11/2019 1136   ALBUMIN 4.6 01/11/2019 1136   AST 23 01/11/2019 1136   ALT 25  01/11/2019 1136   ALKPHOS 84 01/11/2019 1136   BILITOT 0.3 01/11/2019 1136   BILIDIR 0.1 11/16/2017 1655   IBILI 0.6 02/25/2015 0928      Component Value Date/Time   TSH 2.26 03/23/2019 1005   TSH 2.44 09/20/2018 1118   TSH 4.06 11/09/2017 1557     Ref. Range 01/11/2019 11:36  Vitamin D, 25-Hydroxy Latest Ref Range: 30.0 - 100.0 ng/mL 27.3 (L)    I, Doreene Nest, am acting as Location manager for [employer]. Owens Shark, DO  I have reviewed the above documentation for accuracy and completeness, and I agree with the above. -Jearld Lesch, DO

## 2019-05-01 ENCOUNTER — Encounter: Payer: Self-pay | Admitting: Internal Medicine

## 2019-05-04 DIAGNOSIS — Z20828 Contact with and (suspected) exposure to other viral communicable diseases: Secondary | ICD-10-CM | POA: Diagnosis not present

## 2019-05-08 ENCOUNTER — Ambulatory Visit (INDEPENDENT_AMBULATORY_CARE_PROVIDER_SITE_OTHER): Payer: BC Managed Care – PPO | Admitting: Psychology

## 2019-05-08 ENCOUNTER — Other Ambulatory Visit: Payer: Self-pay

## 2019-05-08 DIAGNOSIS — F3289 Other specified depressive episodes: Secondary | ICD-10-CM

## 2019-05-08 NOTE — Progress Notes (Signed)
Office: [phone]  /  Fax: [phone]    Date: May 08, 2019    Appointment Start Time: 10:35am Duration: 27 minutes Provider: Glennie Isle, Psy.D. Type of Session: Individual Therapy  Location of Patient: Home Location of Provider: Provider's Home Type of Contact: Telepsychological Visit via [employer] WebEx   Session Content: Alison Best is a 63 y.o. female presenting via [place] for a follow-up appointment to address the previously established treatment goal of decreasing emotional eating. Of note, this provider called Charlet at 10:33am as she did not present for today's appointment. Directions were provided. As such, today's appointment was initiated 5 minutes late. Today's appointment was a telepsychological visit, as this provider's clinic is seeing a limited number of patients for in-person visits due to COVID-19. Therapeutic services will resume to in-person appointments once deemed appropriate. Alison Best expressed understanding regarding the rationale for telepsychological services, and provided verbal consent for today's appointment. Prior to proceeding with today's appointment, [name]'s physical location at the time of this appointment was obtained. Alison Best reported she was at home and provided the address. In the event of technical difficulties, Alison Best shared a phone number she could be reached at. Alison Best and this provider participated in today's telepsychological service. Also, Alison Best denied anyone else being present in the room or on the WebEx appointment.  This provider conducted a brief check-in and verbally administered the PHQ-9 and GAD-7. Alison Best shared she is waiting on the results on her COVID-19 test. She shared not feeling well recently prompting her to get tested. In addition, Alison Best stated she was prescribed a medication that she started that helped with cravings, but she discontinued the medication due to side effects. It was recommended she inform Dr. Owens Shark as she prescribed the medication; [name]  agreed. Regarding eating, Alison Best discussed "feeling out of control," but indicated she feels "better." Since the last appointment with this provider, Alison Best reported a "couple" episodes of emotional eating. This was explored. She discussed trying to "be mindful" and noted being alone during the day and attending Zoom meetings contributed to emotional eating. She shared she established a goal for herself to avoid emotional eating in the evenings. Remainder of session focused further on mindfulness. She was led through an exercise involving her breath and this provider discussed using the breath as an anchor in the future, especially when experiencing emotional hunger. Following the exercise, Alison Best reported, "That was good. I especially like the image of the leaf on the stream." Alison Best provided verbal consent during today's appointment for this provider to send the handout for the exercise via e-mail. This provider discussed the utilization of YouTube for mindfulness exercises, specifically videos by [name]. Alison Best was receptive to today's session as evidenced by openness to sharing, responsiveness to feedback, and willingness to continue engaging in mindfulness.  Mental Status Examination:  Appearance: neat Behavior: cooperative Mood: euthymic Affect: mood congruent Speech: normal in rate, volume, and tone Eye Contact: appropriate Psychomotor Activity: appropriate Thought Process: linear, logical, and goal directed  Content/Perceptual Disturbances: denies suicidal and homicidal ideation, plan, and intent and no hallucinations, delusions, bizarre thinking or behavior reported or observed Orientation: time, person, place and purpose of appointment Cognition/Sensorium: memory, attention, language, and fund of knowledge intact  Insight: good Judgment: good  Structured Assessment Results: The Patient Health Questionnaire-9 (PHQ-9) is a self-report measure that assesses symptoms and severity of depression  over the course of the last two weeks. Alison Best obtained a score of 6 suggesting mild depression. Alison Best finds the endorsed symptoms to be  not difficult at all. [name] interest or pleasure in doing things 0  Feeling down, depressed, or hopeless 1  Trouble falling or staying asleep, or sleeping too much 2  Feeling tired or having little energy 2  Poor appetite or overeating 0  Feeling bad about yourself --- or that you are a failure or have let yourself or your family down 0  Trouble concentrating on things, such as reading the newspaper or watching television 1  Moving or speaking so slowly that other people could have noticed? Or the opposite --- being so fidgety or restless that you have been moving around a lot more than usual 0  Thoughts that you would be better off dead or hurting yourself in some way 0  PHQ-9 Score 6    The Generalized Anxiety Disorder-7 (GAD-7) is a brief self-report measure that assesses symptoms of anxiety over the course of the last two weeks. [name] obtained a score of 2 suggesting minimal anxiety. Alison Best finds the endorsed symptoms to be somewhat difficult. Feeling nervous, anxious, on edge 1  Not being able to stop or control worrying 0  Worrying too much about different things 1  Trouble relaxing 0  Being so restless that it's hard to sit still 0  Becoming easily annoyed or irritable 0  Feeling afraid as if something awful might happen 0  GAD-7 Score 2   Interventions:  Conducted a brief chart review Verbal administration of PHQ-9 and GAD-7 for symptom monitoring Provided empathic reflections and validation Reviewed content from the previous session Engaged patient in a mindfulness exercise Employed supportive psychotherapy interventions to facilitate reduced distress, and to improve coping skills with identified stressors Employed acceptance and commitment interventions to emphasize mindfulness and acceptance without struggle  DSM-5 Diagnosis: 311 (F32.8) Other  Specified Depressive Disorder, Emotional Eating Behaviors  Treatment Goal & Progress: During the initial appointment with this provider, the following treatment goal was established: decrease emotional eating. Devora has demonstrated progress in her goal as evidenced by increased awareness of hunger patterns and triggers for emotional eating. Despite recent episodes of emotional eating, Ana continues to demonstrate willingness to engage in learned skills to assist with coping.   Plan: Latha continues to appear able and willing to participate as evidenced by engagement in reciprocal conversation, and asking questions for clarification as appropriate. The next appointment will be scheduled in two weeks, which will be via [employer]. Once this provider's office resumes in-person appointments and it is deemed appropriate, Dene will be notified. The next session will focus further on mindfulness.

## 2019-05-10 ENCOUNTER — Other Ambulatory Visit: Payer: Self-pay

## 2019-05-10 ENCOUNTER — Telehealth: Payer: BLUE CROSS/BLUE SHIELD | Admitting: Family

## 2019-05-10 ENCOUNTER — Ambulatory Visit (INDEPENDENT_AMBULATORY_CARE_PROVIDER_SITE_OTHER): Payer: BC Managed Care – PPO | Admitting: Bariatrics

## 2019-05-10 DIAGNOSIS — G4733 Obstructive sleep apnea (adult) (pediatric): Secondary | ICD-10-CM | POA: Diagnosis not present

## 2019-05-10 DIAGNOSIS — F3289 Other specified depressive episodes: Secondary | ICD-10-CM

## 2019-05-10 DIAGNOSIS — R7303 Prediabetes: Secondary | ICD-10-CM

## 2019-05-10 DIAGNOSIS — J329 Chronic sinusitis, unspecified: Secondary | ICD-10-CM

## 2019-05-10 DIAGNOSIS — B9689 Other specified bacterial agents as the cause of diseases classified elsewhere: Secondary | ICD-10-CM

## 2019-05-10 MED ORDER — AMOXICILLIN-POT CLAVULANATE 875-125 MG PO TABS: 1.0000 | ORAL_TABLET | Freq: Two times a day (BID) | ORAL | 0 refills | Status: AC

## 2019-05-10 NOTE — Progress Notes (Signed)
Greater than 5 minutes, yet less than 10 minutes of time have been spent researching, coordinating, and implementing care for this patient today.  Thank you for the details you included in the comment boxes. Those details are very helpful in determining the best course of treatment for you and help us to provide the best care.  We are sorry that you are not feeling well.  Here is how we plan to help!  Based on what you have shared with me it looks like you have sinusitis.  Sinusitis is inflammation and infection in the sinus cavities of the head.  Based on your presentation I believe you most likely have Acute Bacterial Sinusitis.  This is an infection caused by bacteria and is treated with antibiotics. I have prescribed Augmentin 875mg/125mg one tablet twice daily with food, for 7 days. You may use an oral decongestant such as Mucinex D or if you have glaucoma or high blood pressure use plain Mucinex. Saline nasal spray help and can safely be used as often as needed for congestion.  If you develop worsening sinus pain, fever or notice severe headache and vision changes, or if symptoms are not better after completion of antibiotic, please schedule an appointment with a health care provider.    Sinus infections are not as easily transmitted as other respiratory infection, however we still recommend that you avoid close contact with loved ones, especially the very young and elderly.  Remember to wash your hands thoroughly throughout the day as this is the number one way to prevent the spread of infection!  Home Care:  Only take medications as instructed by your medical team.  Complete the entire course of an antibiotic.  Do not take these medications with alcohol.  A steam or ultrasonic humidifier can help congestion.  You can place a towel over your head and breathe in the steam from hot water coming from a faucet.  Avoid close contacts especially the very young and the elderly.  Cover your  mouth when you cough or sneeze.  Always remember to wash your hands.  Get Help Right Away If:  You develop worsening fever or sinus pain.  You develop a severe head ache or visual changes.  Your symptoms persist after you have completed your treatment plan.  Make sure you  Understand these instructions.  Will watch your condition.  Will get help right away if you are not doing well or get worse.  Your e-visit answers were reviewed by a board certified advanced clinical practitioner to complete your personal care plan.  Depending on the condition, your plan could have included both over the counter or prescription medications.  If there is a problem please reply  once you have received a response from your provider.  Your safety is important to us.  If you have drug allergies check your prescription carefully.    You can use MyChart to ask questions about today's visit, request a non-urgent call back, or ask for a work or school excuse for 24 hours related to this e-Visit. If it has been greater than 24 hours you will need to follow up with your provider, or enter a new e-Visit to address those concerns.  You will get an e-mail in the next two days asking about your experience.  I hope that your e-visit has been valuable and will speed your recovery. Thank you for using e-visits.    

## 2019-05-15 ENCOUNTER — Encounter (INDEPENDENT_AMBULATORY_CARE_PROVIDER_SITE_OTHER): Payer: Self-pay | Admitting: Bariatrics

## 2019-05-15 NOTE — Progress Notes (Signed)
Office: [phone]  /  Fax: [phone] TeleHealth Visit:  Alison Best has verbally consented to this TeleHealth visit today. The patient is located at home, the provider is located at the [employer] [facility] office. The participants in this visit include the listed provider, patient and any and all parties involved. The visit was conducted today via WebEx.  HPI:   Chief Complaint: OBESITY Alison Best is here to discuss her progress with her obesity treatment plan. She is on the Category 2 plan and is following her eating plan approximately 50 % of the time. She states she is exercising 0 minutes 0 times per week. Alison Best states that her weight is the same. She states that she is waiting on COVID results. She wants to discuss Topiramate. We were unable to weigh the patient today for this TeleHealth visit. She feels as if she has maintained weight since her last visit. She has gained 2 lbs since starting treatment with [name].  OSA (obstructive sleep apnea) Alison Best has a diagnosis of obstructive sleep apnea and she is not using CPAP. She tried, but she had side effects.  Pre-Diabetes Alison Best has a diagnosis of prediabetes based on her elevated Hgb A1c and was informed this puts her at greater risk of developing diabetes. She is taking metformin currently and continues to work on diet and exercise to decrease risk of diabetes. She denies nausea or hypoglycemia.  Depression with emotional eating behaviors Alison Best is struggling with emotional eating and using food for comfort to the extent that it is negatively impacting her health. She often snacks when she is not hungry. Alison Best sometimes feels she is out of control and then feels guilty that she made poor food choices. Alison Best is seeing Dr. Mallie Mussel (bariatric psychologist). She states she started on the Topamax for one week. She has been working on behavior modification techniques to help reduce her emotional eating and has been minimally successful.  She shows no sign of suicidal or homicidal ideations.  ASSESSMENT AND PLAN:  OSA (obstructive sleep apnea)  Prediabetes  Other depression  PLAN:  OSA (obstructive sleep apnea) We discussed the importance of weight loss, which may improve obstructive sleep apnea.  Pre-Diabetes Alison Best will continue to work on weight loss, exercise, and decreasing simple carbohydrates in her diet to help decrease the risk of diabetes. We dicussed metformin including benefits and risks. She was informed that eating too many simple carbohydrates or too many calories at one sitting increases the likelihood of GI side effects. Alison Best will continue metformin for now and a prescription was not written today. Alison Best agreed to follow up with [name] as directed to monitor her progress.  Depression with Emotional Eating Behaviors We discussed behavior modification techniques today to help Alison Best deal with her emotional eating and depression. [name] will continue with Dr. Mallie Mussel and she will retry the Topamax after all the signs and symptoms of sinus infection subside. Alison Best agreed to follow up as directed.  Obesity Alison Best is currently in the action stage of change. As such, her goal is to continue with weight loss efforts She has agreed to follow the Category 2 plan Alison Best will increase activity for weight loss and overall health benefits. We discussed the following Behavioral Modification Strategies today: increase H2O intake, no skipping meals, keeping healthy foods in the home, increasing lean protein intake, decreasing simple carbohydrates, increasing vegetables, decrease eating out and work on meal planning and intentional eating Alison Best will increase protein at dinner.  Alison Best has agreed to  follow up with our clinic in 2 weeks. She was informed of the importance of frequent follow up visits to maximize her success with intensive lifestyle modifications for her multiple health conditions.  ALLERGIES: Allergies  Allergen Reactions   . Levofloxacin In D5w Other (See Comments)    Achilles tendon weakness  . Mangifera Indica Anaphylaxis  . Mango Flavor Swelling  . Bupropion Nausea And Vomiting  . Levaquin [Levofloxacin] Other (See Comments)    Severe tendonitis Tolerates Cipro    MEDICATIONS: Current Outpatient Medications on File Prior to Visit  Medication Sig Dispense Refill  . acetaminophen (TYLENOL) 325 MG tablet Take 2 tablets (650 mg total) by mouth every 6 (six) hours as needed for moderate pain or headache.    . albuterol (PROVENTIL HFA;VENTOLIN HFA) 108 (90 Base) MCG/ACT inhaler INHALE 2 PUFFS INTO THE LUNGS EVERY 4 HOURS AS NEEDED FOR WHEEZING 8.5 g 6  . aspirin 81 MG tablet Take 81 mg by mouth daily.    . cefUROXime (CEFTIN) 500 MG tablet Take 1 tablet (500 mg total) by mouth 2 (two) times daily with a meal. (Patient not taking: Reported on 03/28/2019) 20 tablet 0  . Coenzyme Q10 (CO Q 10 PO) Take 1 capsule by mouth daily.    . diclofenac (VOLTAREN) 75 MG EC tablet TAKE 1 TABLET(75 MG) BY MOUTH TWICE DAILY (Patient not taking: Reported on 03/28/2019) 60 tablet 11  . diltiazem (CARDIZEM CD) 120 MG 24 hr capsule TAKE 1 CAPSULE(120 MG) BY MOUTH DAILY 90 capsule 3  . fluticasone (FLONASE) 50 MCG/ACT nasal spray Place 2 sprays into both nostrils daily. 16 g 6  . furosemide (LASIX) 20 MG tablet TAKE 1 TABLET BY MOUTH DAILY AS NEEDED FOR FLUID RETENTION 30 tablet 2  . glucose blood (CONTOUR NEXT TEST) test strip Use to check blood sugar once a day. 100 each 12  . Lancets Thin MISC Use to check blood sugar once a day. 100 each 0  . metFORMIN (GLUCOPHAGE) 500 MG tablet TAKE 1 TABLET(500 MG) BY MOUTH TWICE DAILY WITH A MEAL 180 tablet 3  . methimazole (TAPAZOLE) 5 MG tablet TAKE 1/2 TABLET( 2.5 MG) BY MOUTH DAILY 135 tablet 0  . metroNIDAZOLE (METROGEL) 1 % gel APPLY EXTERNALLY TO THE AFFECTED AREA DAILY 60 g 11  . Multiple Vitamins-Minerals (MULTIVITAMIN WITH MINERALS) tablet Take 1 tablet by mouth daily.    .  multivitamin-lutein (OCUVITE-LUTEIN) CAPS capsule Take 1 capsule by mouth daily.    . Omega-3 Fatty Acids (FISH OIL PO) Take 1 capsule by mouth daily.    . polyethylene glycol powder (GLYCOLAX/MIRALAX) 17 GM/SCOOP powder Take 17 g by mouth daily. 3350 g 0  . topiramate (TOPAMAX) 25 MG tablet Take 1 tablet (25 mg total) by mouth 2 (two) times daily. 60 tablet 0  . TURMERIC PO Take 1 tablet by mouth daily.    . Vitamin D, Ergocalciferol, (DRISDOL) 1.25 MG (50000 UT) CAPS capsule Take 1 capsule (50,000 Units total) by mouth every 7 (seven) days. 4 capsule 0   No current facility-administered medications on file prior to visit.     PAST MEDICAL HISTORY: Past Medical History:  Diagnosis Date  . Anal fissure   . Arthritis   . Bursitis   . Constipation   . COPD (chronic obstructive pulmonary disease) ([place])   . Diabetes mellitus without complication ([place])    sees Dr. Cruzita Lederer   . Diverticulitis of colon 02/2015  . Ectopic pregnancy   . Emphysema of lung ([place])   .  Frozen shoulder   . Heart murmur   . Hyperlipidemia   . Hyperthyroidism   . Infertility, female   . Irregular heartbeat   . Joint pain   . Obesity   . Osteoarthritis   . Sleep apnea   . Swallowing difficulty   . Thyroid disease    hyperthyroid    PAST SURGICAL HISTORY: Past Surgical History:  Procedure Laterality Date  . APPENDECTOMY    . CESAREAN SECTION    . COLONOSCOPY  09-10-10   per Dr. Acquanetta Sit, diverticulosis of descending colon and sigmoid, internal hemorrhoids, repeat in 10 yrs  . COLOSTOMY N/A 02/20/2015   Procedure: COLOSTOMY;  Surgeon: Jackolyn Confer, MD;  Location: [facility];  Service: General;  Laterality: N/A;  . COLOSTOMY REVISION  02/20/2015   Procedure: Sigmoid Colectomy;  Surgeon: Jackolyn Confer, MD;  Location: [facility];  Service: General;;  . CYSTOSCOPY N/A 02/20/2015   Procedure: Consuela Mimes;  Surgeon: Kathie Rhodes, MD;  Location: [facility];  Service: Urology;  Laterality: N/A;  . DILATION AND CURETTAGE  OF UTERUS     x3  . ILEO LOOP COLOSTOMY CLOSURE N/A 09/26/2015   Procedure: LAPAROSCOPIC LYSIS OF ADHESIONS, COLOSTOMY CLOSURE;  Surgeon: Jackolyn Confer, MD;  Location: [facility];  Service: General;  Laterality: N/A;  . LAPAROTOMY N/A 02/20/2015   Procedure: Emergency EXPLORATORY and drainiage of interabdominal abcesses;  Surgeon: Jackolyn Confer, MD;  Location: [facility];  Service: General;  Laterality: N/A;  . SALPINGOOPHORECTOMY Left 02/20/2015   Procedure: SALPINGO OOPHORECTOMY;  Surgeon: Jackolyn Confer, MD;  Location: [facility];  Service: General;  Laterality: Left;  . TONSILLECTOMY  1980    SOCIAL HISTORY: Social History   Tobacco Use  . Smoking status: Current Every Day Smoker    Packs/day: 0.25    Years: 40.00    Pack years: 10.00    Types: Cigarettes  . Smokeless tobacco: Never Used  . Tobacco comment: less than 1/2 pack a day  Substance Use Topics  . Alcohol use: Yes    Alcohol/week: 0.0 standard drinks    Comment: occ  . Drug use: No    FAMILY HISTORY: Family History  Problem Relation Age of Onset  . Breast cancer Mother   . High blood pressure Mother   . Kidney disease Mother   . Sleep apnea Mother   . Obesity Mother   . CAD Father        CABG  . Skin cancer Father   . Lung cancer Father   . Diverticulitis Father   . Colon polyps Father   . High blood pressure Father   . Stroke Father   . Heart disease Father   . Sleep apnea Father   . COPD Other   . Cancer Other   . Heart disease Other   . Stroke Other   . Allergies Brother   . Breast cancer Maternal Grandmother   . Heart attack Maternal Grandfather   . Heart attack Paternal Grandfather   . Hypertension Neg Hx     ROS: Review of Systems  Constitutional: Negative for weight loss.  Gastrointestinal: Negative for nausea.  Endo/Heme/Allergies:       Negative for hypoglycemia  Psychiatric/Behavioral: Positive for depression. Negative for suicidal ideas.    PHYSICAL EXAM: Pt in no acute distress   RECENT LABS AND TESTS: BMET    Component Value Date/Time   NA 142 01/11/2019 1136   K 4.4 01/11/2019 1136   CL 102 01/11/2019 1136   CO2 22  01/11/2019 1136   GLUCOSE 87 01/11/2019 1136   GLUCOSE 89 11/16/2017 1655   BUN 9 01/11/2019 1136   CREATININE 0.68 01/11/2019 1136   CREATININE 0.68 02/26/2016 1654   CALCIUM 9.5 01/11/2019 1136   GFRNONAA 95 01/11/2019 1136   GFRNONAA >89 02/26/2016 1654   GFRAA 109 01/11/2019 1136   GFRAA >89 02/26/2016 1654   Lab Results  Component Value Date   HGBA1C 6.2 (A) 03/23/2019   HGBA1C 5.7 (A) 09/20/2018   HGBA1C 5.5 09/30/2017   HGBA1C 6.3 04/01/2017   HGBA1C 6.2 10/01/2016   Lab Results  Component Value Date   INSULIN 21.2 01/11/2019   CBC    Component Value Date/Time   WBC 9.5 11/16/2017 1655   RBC 4.69 11/16/2017 1655   HGB 14.6 11/16/2017 1655   HCT 43.4 11/16/2017 1655   PLT 288.0 11/16/2017 1655   MCV 92.5 11/16/2017 1655   MCH 30.1 10/07/2015 0546   MCHC 33.5 11/16/2017 1655   RDW 15.3 11/16/2017 1655   LYMPHSABS 3.1 11/16/2017 1655   MONOABS 0.6 11/16/2017 1655   EOSABS 0.1 11/16/2017 1655   BASOSABS 0.1 11/16/2017 1655   Iron/TIBC/Ferritin/ %Sat No results found for: IRON, TIBC, FERRITIN, IRONPCTSAT Lipid Panel     Component Value Date/Time   CHOL 313 (H) 03/23/2019 1005   TRIG (H) 03/23/2019 1005    488.0 Triglyceride is over 400; calculations on Lipids are invalid.   HDL 24.00 (L) 03/23/2019 1005   CHOLHDL 13 03/23/2019 1005   VLDL 56.8 (H) 05/23/2014 0933   LDLCALC 93 11/02/2011 0947   LDLDIRECT 64.0 03/23/2019 1005   Hepatic Function Panel     Component Value Date/Time   PROT 7.0 01/11/2019 1136   ALBUMIN 4.6 01/11/2019 1136   AST 23 01/11/2019 1136   ALT 25 01/11/2019 1136   ALKPHOS 84 01/11/2019 1136   BILITOT 0.3 01/11/2019 1136   BILIDIR 0.1 11/16/2017 1655   IBILI 0.6 02/25/2015 0928      Component Value Date/Time   TSH 2.26 03/23/2019 1005   TSH 2.44 09/20/2018 1118   TSH 4.06  11/09/2017 1557     Ref. Range 01/11/2019 11:36  Vitamin D, 25-Hydroxy Latest Ref Range: 30.0 - 100.0 ng/mL 27.3 (L)    I, Doreene Nest, am acting as Location manager for [employer]. Owens Shark, DO  I have reviewed the above documentation for accuracy and completeness, and I agree with the above. -Jearld Lesch, DO

## 2019-05-17 NOTE — Progress Notes (Signed)
Office: [phone]  /  Fax: [phone]    Date: May 22, 2019   Appointment Start Time: 8:00am Duration: 33 minutes Provider: Glennie Isle, Psy.D. Type of Session: Individual Therapy  Location of Patient: Home Location of Provider: Provider's Home Type of Contact: Telepsychological Visit via [employer] WebEx   Session Content: Alison Best is a 63 y.o. female presenting via [place] for a follow-up appointment to address the previously established treatment goal of decreasing emotional eating. Today's appointment was a telepsychological visit, as this provider's clinic is seeing a limited number of patients for in-person visits due to COVID-19. Therapeutic services will resume to in-person appointments once deemed appropriate. [name] expressed understanding regarding the rationale for telepsychological services, and provided verbal consent for today's appointment. Prior to proceeding with today's appointment, [name]'s physical location at the time of this appointment was obtained. [name] reported she was at home and provided the address. In the event of technical difficulties, [name] shared a phone number she could be reached at. Collin and this provider participated in today's telepsychological service. Also, Katarina denied anyone else being present in the room or on the WebEx appointment.  This provider conducted a brief check-in and verbally administered the PHQ-9 and GAD-7. [name] shared she is still waiting on her COVID-19 test results. She added, "I am feeling better." [name] reported a desire to further explore her values. She shared uncertainty about how to translate her values to her eating habits. This was explored further and [name] shared she is no longer measuring her food and deviates from the meal plan as the day progresses. Thus, session focused on further exploring values utilizing various scenarios (e.g., Imagine it's your birthday 15 years from now. Your friends and family give speeches about your life.  What do you think they say?) to guide the discussion about values. The following values were identified: family, helping others, kindness, stability, adventure, respect, persistence and flexibility. She was encouraged to use the newly identified values to establish short-term goals related to eating. Deneka agreed. Furthermore, [name] discussed ongoing stressors related to her home, mother's well-being, and current events. This provider recommended longer-term therapeutic services due to ongoing stressors and discussed options to establish care with a new provider, including [name] contacting her insurance company for a list of in-network providers, exploring [email or web address], or this provider placing a referral. [name] provided verbal consent for this provider to place a referral to address ongoing stressors and emotional eating. Overall, [name] was receptive to today's session as evidenced by openness to sharing, responsiveness to feedback, and willingness to establish short-term goals related to her values.  Mental Status Examination:  Appearance: neat Behavior: cooperative Mood: euthymic Affect: mood congruent Speech: normal in rate, volume, and tone Eye Contact: appropriate Psychomotor Activity: appropriate Thought Process: linear, logical, and goal directed  Content/Perceptual Disturbances: denies suicidal and homicidal ideation, plan, and intent and no hallucinations, delusions, bizarre thinking or behavior reported or observed Orientation: time, person, place and purpose of appointment Cognition/Sensorium: memory, attention, language, and fund of knowledge intact  Insight: good Judgment: good  Structured Assessment Results: The Patient Health Questionnaire-9 (PHQ-9) is a self-report measure that assesses symptoms and severity of depression over the course of the last two weeks. [name] obtained a score of 4 suggesting minimal depression. [name] finds the endorsed symptoms to be somewhat difficult.  Little interest or pleasure in doing things 0  Feeling down, depressed, or hopeless 1  Trouble falling or staying asleep, or sleeping too much 0  Feeling tired or having  little energy 1  Poor appetite or overeating 2  Feeling bad about yourself --- or that you are a failure or have let yourself or your family down 0  Trouble concentrating on things, such as reading the newspaper or watching television 0  Moving or speaking so slowly that other people could have noticed? Or the opposite --- being so fidgety or restless that you have been moving around a lot more than usual 0  Thoughts that you would be better off dead or hurting yourself in some way 0  PHQ-9 Score 4    The Generalized Anxiety Disorder-7 (GAD-7) is a brief self-report measure that assesses symptoms of anxiety over the course of the last two weeks. [name] obtained a score of 2 suggesting minimal anxiety. [name] finds the endorsed symptoms to be somewhat difficult. Feeling nervous, anxious, on edge 1  Not being able to stop or control worrying 0  Worrying too much about different things 1  Trouble relaxing 0  Being so restless that it's hard to sit still 0  Becoming easily annoyed or irritable 0  Feeling afraid as if something awful might happen 0  GAD-7 Score 2   Interventions:  Conducted a brief chart review Verbal administration of PHQ-9 and GAD-7 for symptom monitoring Provided empathic reflections and validation Discussed option for a referral for longer-term therapeutic services Employed supportive psychotherapy interventions to facilitate reduced distress, and to improve coping skills with identified stressors Employed acceptance and commitment interventions to emphasize mindfulness, acceptance without struggle, as well as value guided actions  DSM-5 Diagnosis: 311 (F32.8) Other Specified Depressive Disorder, Emotional Eating Behaviors  Treatment Goal & Progress: During the initial appointment with this provider, the  following treatment goal was established: decrease emotional eating. [name] has demonstrated progress in her goal as evidenced by increased awareness of hunger patterns and triggers for emotional eating. Despite recent deviations from the structured meal plan, Azarya continues to demonstrate willingness to explore obstacles.   Plan: Birdena continues to appear able and willing to participate as evidenced by engagement in reciprocal conversation, and asking questions for clarification as appropriate. The next appointment will be scheduled in two weeks, which will be via [employer]. Once this provider's office resumes in-person appointments and it is deemed appropriate, Genita will be notified. The next session will focus on reviewing values, discussing mindfulness further, and possible termination. Notably, mindfulness was not discussed today due to [place] presenting concerns.

## 2019-05-22 ENCOUNTER — Ambulatory Visit (INDEPENDENT_AMBULATORY_CARE_PROVIDER_SITE_OTHER): Payer: BC Managed Care – PPO | Admitting: Psychology

## 2019-05-22 ENCOUNTER — Other Ambulatory Visit: Payer: Self-pay

## 2019-05-22 DIAGNOSIS — F3289 Other specified depressive episodes: Secondary | ICD-10-CM | POA: Diagnosis not present

## 2019-05-29 ENCOUNTER — Encounter (INDEPENDENT_AMBULATORY_CARE_PROVIDER_SITE_OTHER): Payer: Self-pay | Admitting: Bariatrics

## 2019-05-29 ENCOUNTER — Telehealth (INDEPENDENT_AMBULATORY_CARE_PROVIDER_SITE_OTHER): Payer: BC Managed Care – PPO | Admitting: Bariatrics

## 2019-05-29 ENCOUNTER — Other Ambulatory Visit: Payer: Self-pay

## 2019-05-29 DIAGNOSIS — F3289 Other specified depressive episodes: Secondary | ICD-10-CM

## 2019-05-29 DIAGNOSIS — E559 Vitamin D deficiency, unspecified: Secondary | ICD-10-CM | POA: Diagnosis not present

## 2019-05-29 DIAGNOSIS — Z6841 Body Mass Index (BMI) 40.0 and over, adult: Secondary | ICD-10-CM | POA: Diagnosis not present

## 2019-05-29 MED ORDER — VITAMIN D (ERGOCALCIFEROL) 1.25 MG (50000 UNIT) PO CAPS: 50000.0000 [IU] | ORAL_CAPSULE | ORAL | 0 refills | Status: DC

## 2019-05-29 NOTE — Progress Notes (Signed)
Office: [phone]  /  Fax: [phone] TeleHealth Visit:  Alison Best has verbally consented to this TeleHealth visit today. The patient is located at home, the provider is located at the [employer] [facility] office. The participants in this visit include the listed provider and patient. The visit was conducted today via Webex.  HPI:   Chief Complaint: OBESITY Alison Best is here to discuss her progress with her obesity treatment plan. She is on the Category 2 plan and is following her eating plan approximately 30% of the time. She states she is doing calisthenics/knee therapy 10 minutes 5 times per week. Alison Best states that she has gained 1 lb (weight 231). She was COVID-19 negative and reports she just had a sinus infection.  We were unable to weigh the patient today for this TeleHealth visit. She feels as if she has gained 1 lb since her last visit. She has lost 0 lbs since starting treatment with [name].  Vitamin D deficiency Alison Best has a diagnosis of Vitamin D deficiency. Her last Vitamin D level was 27.3 on 01/11/2019. She is currently taking prescription Vit D and denies nausea, vomiting or muscle weakness.  Depression with emotional eating behaviors Alison Best is struggling with emotional eating and using food for comfort to the extent that it is negatively impacting her health. She often snacks when she is not hungry. Alison Best sometimes feels she is out of control and then feels guilty that she made poor food choices. She has been working on behavior modification techniques to help reduce her emotional eating and has been somewhat successful. Alison Best had a sinus infection and was afraid that onset of the symptoms was related to Topamax and she stopped last visit. She shows no sign of suicidal or homicidal ideations.  Depression screen [facility] 2/9 02/21/2019 01/11/2019 07/26/2017 02/05/2016 06/06/2014  Decreased Interest 0 2 0 0 0  Down, Depressed, Hopeless 1 1 0 0 0  PHQ - 2 Score 1 3 0 0 0   Altered sleeping 0 2 - - -  Tired, decreased energy 1 2 - - -  Change in appetite 1 1 - - -  Feeling bad or failure about yourself  0 1 - - -  Trouble concentrating 0 0 - - -  Moving slowly or fidgety/restless 0 2 - - -  Suicidal thoughts 0 0 - - -  PHQ-9 Score 3 11 - - -  Difficult doing work/chores - Somewhat difficult - - -  Some recent data might be hidden   ASSESSMENT AND PLAN:  Vitamin D deficiency - Plan: Vitamin D, Ergocalciferol, (DRISDOL) 1.25 MG (50000 UT) CAPS capsule  PLAN:  Vitamin D Deficiency [name] was informed that low Vitamin D levels contributes to fatigue and are associated with obesity, breast, and colon cancer. She agrees to continue to take prescription Vit D @ 50,000 IU every week #4 with 0 refills and will follow-up for routine testing of Vitamin D, at least 2-3 times per year. She was informed of the risk of over-replacement of Vitamin D and agrees to not increase her dose unless she discusses this with [name] first.[name] agrees to follow-up with our clinic in 2 weeks.  Depression with Emotional Eating Behaviors We discussed behavior modification techniques today to help Alison Best deal with her emotional eating and depression. Alison Best was instructed to resume the Topamax prescription at home and will follow-up with Dr. Mallie Mussel.   Obesity Alison Best is currently in the action stage of change. As such, her goal is  to continue with weight loss efforts. She has agreed to follow the Category 2 plan. Alison Best will work on meal planning, intentional eating, increasing her water intake, and increasing her protein at dinner. Alison Best has been instructed to continue PT for her knee 10 minutes 5 times per week.  We discussed the following Behavioral Modification Strategies today: increasing lean protein intake, decreasing simple carbohydrates, increasing vegetables, increase H20 intake, decrease eating out, no skipping meals, work on meal planning and easy cooking plans, and keeping healthy foods in  the home.   Alison Best has agreed to follow-up with our clinic in 2 weeks. She was informed of the importance of frequent follow-up visits to maximize her success with intensive lifestyle modifications for her multiple health conditions.  ALLERGIES: Allergies  Allergen Reactions  . Levofloxacin In D5w Other (See Comments)    Achilles tendon weakness  . Mangifera Indica Anaphylaxis  . Mango Flavor Swelling  . Bupropion Nausea And Vomiting  . Levaquin [Levofloxacin] Other (See Comments)    Severe tendonitis Tolerates Cipro    MEDICATIONS: Current Outpatient Medications on File Prior to Visit  Medication Sig Dispense Refill  . acetaminophen (TYLENOL) 325 MG tablet Take 2 tablets (650 mg total) by mouth every 6 (six) hours as needed for moderate pain or headache.    . albuterol (PROVENTIL HFA;VENTOLIN HFA) 108 (90 Base) MCG/ACT inhaler INHALE 2 PUFFS INTO THE LUNGS EVERY 4 HOURS AS NEEDED FOR WHEEZING 8.5 g 6  . aspirin 81 MG tablet Take 81 mg by mouth daily.    . cefUROXime (CEFTIN) 500 MG tablet Take 1 tablet (500 mg total) by mouth 2 (two) times daily with a meal. (Patient not taking: Reported on 03/28/2019) 20 tablet 0  . Coenzyme Q10 (CO Q 10 PO) Take 1 capsule by mouth daily.    . diclofenac (VOLTAREN) 75 MG EC tablet TAKE 1 TABLET(75 MG) BY MOUTH TWICE DAILY (Patient not taking: Reported on 03/28/2019) 60 tablet 11  . diltiazem (CARDIZEM CD) 120 MG 24 hr capsule TAKE 1 CAPSULE(120 MG) BY MOUTH DAILY 90 capsule 3  . fluticasone (FLONASE) 50 MCG/ACT nasal spray Place 2 sprays into both nostrils daily. 16 g 6  . furosemide (LASIX) 20 MG tablet TAKE 1 TABLET BY MOUTH DAILY AS NEEDED FOR FLUID RETENTION 30 tablet 2  . glucose blood (CONTOUR NEXT TEST) test strip Use to check blood sugar once a day. 100 each 12  . Lancets Thin MISC Use to check blood sugar once a day. 100 each 0  . metFORMIN (GLUCOPHAGE) 500 MG tablet TAKE 1 TABLET(500 MG) BY MOUTH TWICE DAILY WITH A MEAL 180 tablet 3  .  methimazole (TAPAZOLE) 5 MG tablet TAKE 1/2 TABLET( 2.5 MG) BY MOUTH DAILY 135 tablet 0  . metroNIDAZOLE (METROGEL) 1 % gel APPLY EXTERNALLY TO THE AFFECTED AREA DAILY 60 g 11  . Multiple Vitamins-Minerals (MULTIVITAMIN WITH MINERALS) tablet Take 1 tablet by mouth daily.    . multivitamin-lutein (OCUVITE-LUTEIN) CAPS capsule Take 1 capsule by mouth daily.    . Omega-3 Fatty Acids (FISH OIL PO) Take 1 capsule by mouth daily.    . polyethylene glycol powder (GLYCOLAX/MIRALAX) 17 GM/SCOOP powder Take 17 g by mouth daily. 3350 g 0  . topiramate (TOPAMAX) 25 MG tablet Take 1 tablet (25 mg total) by mouth 2 (two) times daily. 60 tablet 0  . TURMERIC PO Take 1 tablet by mouth daily.     No current facility-administered medications on file prior to visit.  PAST MEDICAL HISTORY: Past Medical History:  Diagnosis Date  . Anal fissure   . Arthritis   . Bursitis   . Constipation   . COPD (chronic obstructive pulmonary disease) ([place])   . Diabetes mellitus without complication ([place])    sees Dr. Cruzita Lederer   . Diverticulitis of colon 02/2015  . Ectopic pregnancy   . Emphysema of lung ([place])   . Frozen shoulder   . Heart murmur   . Hyperlipidemia   . Hyperthyroidism   . Infertility, female   . Irregular heartbeat   . Joint pain   . Obesity   . Osteoarthritis   . Sleep apnea   . Swallowing difficulty   . Thyroid disease    hyperthyroid    PAST SURGICAL HISTORY: Past Surgical History:  Procedure Laterality Date  . APPENDECTOMY    . CESAREAN SECTION    . COLONOSCOPY  09-10-10   per Dr. Acquanetta Sit, diverticulosis of descending colon and sigmoid, internal hemorrhoids, repeat in 10 yrs  . COLOSTOMY N/A 02/20/2015   Procedure: COLOSTOMY;  Surgeon: Jackolyn Confer, MD;  Location: [facility];  Service: General;  Laterality: N/A;  . COLOSTOMY REVISION  02/20/2015   Procedure: Sigmoid Colectomy;  Surgeon: Jackolyn Confer, MD;  Location: [facility];  Service: General;;  . CYSTOSCOPY N/A 02/20/2015    Procedure: Consuela Mimes;  Surgeon: Kathie Rhodes, MD;  Location: [facility];  Service: Urology;  Laterality: N/A;  . DILATION AND CURETTAGE OF UTERUS     x3  . ILEO LOOP COLOSTOMY CLOSURE N/A 09/26/2015   Procedure: LAPAROSCOPIC LYSIS OF ADHESIONS, COLOSTOMY CLOSURE;  Surgeon: Jackolyn Confer, MD;  Location: [facility];  Service: General;  Laterality: N/A;  . LAPAROTOMY N/A 02/20/2015   Procedure: Emergency EXPLORATORY and drainiage of interabdominal abcesses;  Surgeon: Jackolyn Confer, MD;  Location: [facility];  Service: General;  Laterality: N/A;  . SALPINGOOPHORECTOMY Left 02/20/2015   Procedure: SALPINGO OOPHORECTOMY;  Surgeon: Jackolyn Confer, MD;  Location: [facility];  Service: General;  Laterality: Left;  . TONSILLECTOMY  1980    SOCIAL HISTORY: Social History   Tobacco Use  . Smoking status: Current Every Day Smoker    Packs/day: 0.25    Years: 40.00    Pack years: 10.00    Types: Cigarettes  . Smokeless tobacco: Never Used  . Tobacco comment: less than 1/2 pack a day  Substance Use Topics  . Alcohol use: Yes    Alcohol/week: 0.0 standard drinks    Comment: occ  . Drug use: No    FAMILY HISTORY: Family History  Problem Relation Age of Onset  . Breast cancer Mother   . High blood pressure Mother   . Kidney disease Mother   . Sleep apnea Mother   . Obesity Mother   . CAD Father        CABG  . Skin cancer Father   . Lung cancer Father   . Diverticulitis Father   . Colon polyps Father   . High blood pressure Father   . Stroke Father   . Heart disease Father   . Sleep apnea Father   . COPD Other   . Cancer Other   . Heart disease Other   . Stroke Other   . Allergies Brother   . Breast cancer Maternal Grandmother   . Heart attack Maternal Grandfather   . Heart attack Paternal Grandfather   . Hypertension Neg Hx    ROS: Review of Systems  Gastrointestinal: Negative for nausea and vomiting.  Musculoskeletal:       Negative for muscle weakness.  Psychiatric/Behavioral:  Positive for depression (emotional eating). Negative for suicidal ideas.       Negative for homicidal ideas.   PHYSICAL EXAM: Pt in no acute distress  RECENT LABS AND TESTS: BMET    Component Value Date/Time   NA 142 01/11/2019 1136   K 4.4 01/11/2019 1136   CL 102 01/11/2019 1136   CO2 22 01/11/2019 1136   GLUCOSE 87 01/11/2019 1136   GLUCOSE 89 11/16/2017 1655   BUN 9 01/11/2019 1136   CREATININE 0.68 01/11/2019 1136   CREATININE 0.68 02/26/2016 1654   CALCIUM 9.5 01/11/2019 1136   GFRNONAA 95 01/11/2019 1136   GFRNONAA >89 02/26/2016 1654   GFRAA 109 01/11/2019 1136   GFRAA >89 02/26/2016 1654   Lab Results  Component Value Date   HGBA1C 6.2 (A) 03/23/2019   HGBA1C 5.7 (A) 09/20/2018   HGBA1C 5.5 09/30/2017   HGBA1C 6.3 04/01/2017   HGBA1C 6.2 10/01/2016   Lab Results  Component Value Date   INSULIN 21.2 01/11/2019   CBC    Component Value Date/Time   WBC 9.5 11/16/2017 1655   RBC 4.69 11/16/2017 1655   HGB 14.6 11/16/2017 1655   HCT 43.4 11/16/2017 1655   PLT 288.0 11/16/2017 1655   MCV 92.5 11/16/2017 1655   MCH 30.1 10/07/2015 0546   MCHC 33.5 11/16/2017 1655   RDW 15.3 11/16/2017 1655   LYMPHSABS 3.1 11/16/2017 1655   MONOABS 0.6 11/16/2017 1655   EOSABS 0.1 11/16/2017 1655   BASOSABS 0.1 11/16/2017 1655   Iron/TIBC/Ferritin/ %Sat No results found for: IRON, TIBC, FERRITIN, IRONPCTSAT Lipid Panel     Component Value Date/Time   CHOL 313 (H) 03/23/2019 1005   TRIG (H) 03/23/2019 1005    488.0 Triglyceride is over 400; calculations on Lipids are invalid.   HDL 24.00 (L) 03/23/2019 1005   CHOLHDL 13 03/23/2019 1005   VLDL 56.8 (H) 05/23/2014 0933   LDLCALC 93 11/02/2011 0947   LDLDIRECT 64.0 03/23/2019 1005   Hepatic Function Panel     Component Value Date/Time   PROT 7.0 01/11/2019 1136   ALBUMIN 4.6 01/11/2019 1136   AST 23 01/11/2019 1136   ALT 25 01/11/2019 1136   ALKPHOS 84 01/11/2019 1136   BILITOT 0.3 01/11/2019 1136   BILIDIR  0.1 11/16/2017 1655   IBILI 0.6 02/25/2015 0928      Component Value Date/Time   TSH 2.26 03/23/2019 1005   TSH 2.44 09/20/2018 1118   TSH 4.06 11/09/2017 1557   Results for LASHONNA, RIEKE (MRN [id number]) as of 05/29/2019 15:29  Ref. Range 01/11/2019 11:36  Vitamin D, 25-Hydroxy Latest Ref Range: 30.0 - 100.0 ng/mL 27.3 (L)   I, Michaelene Song, am acting as Location manager for [employer], DO  I have reviewed the above documentation for accuracy and completeness, and I agree with the above. -Jearld Lesch, DO

## 2019-06-04 NOTE — Progress Notes (Signed)
Office: [phone]  /  Fax: [phone]    Date: June 05, 2019   Best Start Time: 7:59am Duration: 28 minutes Provider: Glennie Best, Psy.D. Type of Session: Individual Therapy  Location of Patient: Home Location of Provider: Provider's Home Type of Contact: Telepsychological Visit via [employer] WebEx   Session Content: Alison Best is a 63 y.o. female presenting via Alison Best for a follow-up Best to address the previously established treatment goal of decreasing emotional eating. Today's Best was a telepsychological visit, as this provider's clinic is seeing a limited number of patients for in-person visits due to COVID-19. Therapeutic services will resume to in-person appointments once deemed appropriate. Alison Best expressed understanding regarding the rationale for telepsychological services, and provided verbal consent for today's Best. Prior to proceeding with today's Best, Alison Best's physical location at the time of this Best was obtained. Alison Best reported she was at home and provided the address. In the event of technical difficulties, Alison Best shared a phone number she could be reached at. Alison Best and this provider participated in today's telepsychological service. Also, Alison Best denied anyone else being present in the room or on the WebEx Best.  This provider conducted a brief check-in and verbally administered the PHQ-9 and GAD-7. Alison Best stated she started taking Topirmate, which was prescribed by Alison Best. She further shared she found reflecting on her values as "helpful." In addition, Alison Best believes her value of "persistance" helped her "work through temptations" when it comes to food and emotional eating. This was positively reinforced. Since the last Best with this provider, Alison Best shared there were a "couple times" where she felt she was "rewarding" herself when she accomplished tasks. Alison Best focused on mindfulness. She acknowledged  she did "conciously" engage in mindfulness exercises. As such, frief psychoeducation regarding the benefits of mindfulness was provided again. She was also led through a mindfulness exercise, "A Taste of Mindfulness." Her experience was processed. Alison Best shared, "It reminded me of the mind and body connection." She shared that while her mind wandered at times, she was successful at bringing her attention back to the exercise. Alison Best provided verbal consent during today's Best for this provider to send the handout for today's exercise via e-mail. Furthermore, Alison Best will be initiating services with a new provider; therefore, today was the last Best with this provider. She noted, "I feel like you've given me a really good base with specific coping skills." Alison Best was receptive to today's session as evidenced by openness to sharing, responsiveness to feedback, and willingness to continue engaging in learned skills.  Mental Status Examination:  Appearance: neat Behavior: cooperative Mood: euthymic Affect: mood congruent Speech: normal in rate, volume, and tone Eye Contact: appropriate Psychomotor Activity: appropriate Thought Process: linear, logical, and goal directed  Content/Perceptual Disturbances: denies suicidal and homicidal ideation, plan, and intent and no hallucinations, delusions, bizarre thinking or behavior reported or observed Orientation: time, person, place and purpose of Best Cognition/Sensorium: memory, attention, language, and fund of knowledge intact  Insight: good Judgment: good  Structured Assessment Results: The Patient Health Questionnaire-9 (PHQ-9) is a self-report measure that assesses symptoms and severity of depression over the course of the last two weeks. Alison Best obtained a score of 2 suggesting minimal depression. Alison Best finds the endorsed symptoms to be not difficult at all. Alison Best interest or pleasure in doing things 0  Feeling down, depressed, or hopeless 1   Trouble falling or staying asleep, or sleeping too much 0  Feeling tired or having Alison Best energy 0  Poor appetite or  overeating 1  Feeling bad about yourself --- or that you are a failure or have let yourself or your family down 0  Trouble concentrating on things, such as reading the newspaper or watching television 0  Moving or speaking so slowly that other people could have noticed? Or the opposite --- being so fidgety or restless that you have been moving around a lot more than usual 0  Thoughts that you would be better off dead or hurting yourself in some way 0  PHQ-9 Score 2    The Generalized Anxiety Disorder-7 (GAD-7) is a brief self-report measure that assesses symptoms of anxiety over the course of the last two weeks. Alison Best obtained a score of 2 suggesting minimal anxiety. Alison Best finds the endorsed symptoms to be not difficult at all. Feeling nervous, anxious, on edge 1  Not being able to stop or control worrying 0  Worrying too much about different things 1  Trouble relaxing 0  Being so restless that it's hard to sit still 0  Becoming easily annoyed or irritable 0  Feeling afraid as if something awful might happen 0  GAD-7 Score 2   Interventions:  Conducted a brief chart review Verbal administration of PHQ-9 and GAD-7 for symptom monitoring Provided empathic reflections and validation Reviewed content from the previous session Psychoeducation provided regarding mindfulness Engaged patient in a mindfulness exercise Provided positive reinforcement Employed supportive psychotherapy interventions to facilitate reduced distress, and to improve coping skills with identified stressors Employed acceptance and commitment interventions to emphasize mindfulness and acceptance without struggle  DSM-5 Diagnosis: 311 (F32.8) Other Specified Depressive Disorder, Emotional Eating Behaviors  Treatment Goal & Progress: During the initial Best with this provider, the following treatment  goal was established: decrease emotional eating. Alison Best has demonstrated progress in her goal as evidenced by increased awareness of hunger patterns, triggers for emotional eating, and values. She continues to demonstrate willingness to engage in learned skills to assist with coping.   Plan: Today was Alison Best's last Best with this provider. She will be establishing care with a new behavioral health provider based on the referral placed by this provider. The initial Best with the new provider is scheduled for June 08, 2019. No further follow-up planned by this provider.

## 2019-06-05 ENCOUNTER — Other Ambulatory Visit: Payer: Self-pay

## 2019-06-05 ENCOUNTER — Ambulatory Visit (INDEPENDENT_AMBULATORY_CARE_PROVIDER_SITE_OTHER): Payer: BC Managed Care – PPO | Admitting: Psychology

## 2019-06-05 DIAGNOSIS — F3289 Other specified depressive episodes: Secondary | ICD-10-CM | POA: Diagnosis not present

## 2019-06-08 ENCOUNTER — Ambulatory Visit (INDEPENDENT_AMBULATORY_CARE_PROVIDER_SITE_OTHER): Payer: BC Managed Care – PPO | Admitting: Professional

## 2019-06-08 DIAGNOSIS — F4321 Adjustment disorder with depressed mood: Secondary | ICD-10-CM | POA: Diagnosis not present

## 2019-06-12 ENCOUNTER — Encounter (INDEPENDENT_AMBULATORY_CARE_PROVIDER_SITE_OTHER): Payer: Self-pay | Admitting: Bariatrics

## 2019-06-12 ENCOUNTER — Other Ambulatory Visit: Payer: Self-pay

## 2019-06-12 ENCOUNTER — Telehealth (INDEPENDENT_AMBULATORY_CARE_PROVIDER_SITE_OTHER): Payer: BC Managed Care – PPO | Admitting: Bariatrics

## 2019-06-12 DIAGNOSIS — E559 Vitamin D deficiency, unspecified: Secondary | ICD-10-CM | POA: Diagnosis not present

## 2019-06-12 DIAGNOSIS — F3289 Other specified depressive episodes: Secondary | ICD-10-CM | POA: Diagnosis not present

## 2019-06-12 DIAGNOSIS — Z6841 Body Mass Index (BMI) 40.0 and over, adult: Secondary | ICD-10-CM | POA: Diagnosis not present

## 2019-06-12 MED ORDER — VITAMIN D (ERGOCALCIFEROL) 1.25 MG (50000 UNIT) PO CAPS: 50000.0000 [IU] | ORAL_CAPSULE | ORAL | 0 refills | Status: DC

## 2019-06-13 NOTE — Progress Notes (Signed)
Office: [phone]  /  Fax: [phone] TeleHealth Visit:  Alison Best has verbally consented to this TeleHealth visit today. The patient is located at home, the provider is located at the [facility] office. The participants in this visit include the listed provider and patient and any and all parties involved. The visit was conducted today via WebEx.  HPI:   Chief Complaint: OBESITY Alison Best is here to discuss her progress with her obesity treatment plan. She is on the Category 2 plan and she is following her eating plan approximately 50 % of the time. She states she is exercising 10 minutes 7 times per week. Alison Best is down 1 pound (weight 229 lbs 06/12/19). She is taking the Topamax without any side effects. We were unable to weigh the patient today for this TeleHealth visit. She feels as if she has lost weight since her last visit. She has lost 0 lbs since starting treatment with Alison Best.  Vitamin D deficiency Alison Best has a diagnosis of vitamin D deficiency. Alison Best is currently taking vit D and she denies nausea, vomiting or muscle weakness.  Depression with emotional eating behaviors Alison Best is seeing Dr. Mallie Mussel Bariatric psychologist).Alison Best is struggling with emotional eating and using food for comfort to the extent that it is negatively impacting her health. She often snacks when she is not hungry. Alison Best sometimes feels she is out of control and then feels guilty that she made poor food choices. She has been working on behavior modification techniques to help reduce her emotional eating and has been somewhat successful. She shows no sign of suicidal or homicidal ideations.  Constipation Alison Best notes constipation for the last few weeks, worse since attempting weight loss. She is taking miralax and fiber capsules. She has retained stool.   ASSESSMENT AND PLAN:  Vitamin D deficiency - Plan: Vitamin D, Ergocalciferol, (DRISDOL) 1.25 MG (50000 UT) CAPS capsule  Other depression  Class 3 severe obesity with serious comorbidity and body mass index (BMI) of 40.0 to 44.9 in adult, unspecified obesity type ([place])  PLAN:  Vitamin D Deficiency Alison Best was informed that low vitamin D levels contributes to fatigue and are associated with obesity, breast, and colon cancer. She agrees to continue to take prescription Vit D @50 ,000 IU every week #4 with no refills and will follow up for routine testing of vitamin D, at least 2-3 times per year. She was informed of the risk of over-replacement of vitamin D and agrees to not increase her dose unless she discusses this with Alison Best first. Alison Best agrees to follow up as directed.  Depression with Emotional Eating Behaviors We discussed coping skills to help Alison Best deal with her emotional eating and depression. Alison Best was referred to [employer] (therapist). She has agreed to continue the Topamax and  follow up as directed.  Constipation Alison Best was informed decrease bowel movement frequency is normal while losing weight, but stools should not be hard or painful. She was advised to use Fleets enema if needed. She may take OTC Magnesium 300 mg once daily. She will take flax seed or wheat bran as needed and she can include prunes in fruit.  Obesity Alison Best is currently in the action stage of change. As such, her goal is to continue with weight loss efforts She has agreed to follow the Category 2 plan Alison Best will continue activities (walking, knee bends) for weight loss and overall health benefits. We discussed the following Behavioral Modification Strategies today: planning for success, increase H2O intake, no skipping meals,  keeping healthy foods in the home, increasing lean protein intake, decreasing simple carbohydrates, increasing vegetables, decrease eating out and work on meal planning and intentional eating Alison Best will make better choices (grocery shopping, decrease impulse buying).  Alison Best has agreed to follow up with our clinic in 2 weeks. She was informed of  the importance of frequent follow up visits to maximize her success with intensive lifestyle modifications for her multiple health conditions.  ALLERGIES: Allergies  Allergen Reactions  . Levofloxacin In D5w Other (See Comments)    Achilles tendon weakness  . Mangifera Indica Anaphylaxis  . Mango Flavor Swelling  . Bupropion Nausea And Vomiting  . Levaquin [Levofloxacin] Other (See Comments)    Severe tendonitis Tolerates Cipro    MEDICATIONS: Current Outpatient Medications on File Prior to Visit  Medication Sig Dispense Refill  . acetaminophen (TYLENOL) 325 MG tablet Take 2 tablets (650 mg total) by mouth every 6 (six) hours as needed for moderate pain or headache.    . albuterol (PROVENTIL HFA;VENTOLIN HFA) 108 (90 Base) MCG/ACT inhaler INHALE 2 PUFFS INTO THE LUNGS EVERY 4 HOURS AS NEEDED FOR WHEEZING 8.5 g 6  . aspirin 81 MG tablet Take 81 mg by mouth daily.    . cefUROXime (CEFTIN) 500 MG tablet Take 1 tablet (500 mg total) by mouth 2 (two) times daily with a meal. (Patient not taking: Reported on 03/28/2019) 20 tablet 0  . Coenzyme Q10 (CO Q 10 PO) Take 1 capsule by mouth daily.    . diclofenac (VOLTAREN) 75 MG EC tablet TAKE 1 TABLET(75 MG) BY MOUTH TWICE DAILY (Patient not taking: Reported on 03/28/2019) 60 tablet 11  . diltiazem (CARDIZEM CD) 120 MG 24 hr capsule TAKE 1 CAPSULE(120 MG) BY MOUTH DAILY 90 capsule 3  . fluticasone (FLONASE) 50 MCG/ACT nasal spray Place 2 sprays into both nostrils daily. 16 g 6  . furosemide (LASIX) 20 MG tablet TAKE 1 TABLET BY MOUTH DAILY AS NEEDED FOR FLUID RETENTION 30 tablet 2  . glucose blood (CONTOUR NEXT TEST) test strip Use to check blood sugar once a day. 100 each 12  . Lancets Thin MISC Use to check blood sugar once a day. 100 each 0  . metFORMIN (GLUCOPHAGE) 500 MG tablet TAKE 1 TABLET(500 MG) BY MOUTH TWICE DAILY WITH A MEAL 180 tablet 3  . methimazole (TAPAZOLE) 5 MG tablet TAKE 1/2 TABLET( 2.5 MG) BY MOUTH DAILY 135 tablet 0  .  metroNIDAZOLE (METROGEL) 1 % gel APPLY EXTERNALLY TO THE AFFECTED AREA DAILY 60 g 11  . Multiple Vitamins-Minerals (MULTIVITAMIN WITH MINERALS) tablet Take 1 tablet by mouth daily.    . multivitamin-lutein (OCUVITE-LUTEIN) CAPS capsule Take 1 capsule by mouth daily.    . Omega-3 Fatty Acids (FISH OIL PO) Take 1 capsule by mouth daily.    . polyethylene glycol powder (GLYCOLAX/MIRALAX) 17 GM/SCOOP powder Take 17 g by mouth daily. 3350 g 0  . topiramate (TOPAMAX) 25 MG tablet Take 1 tablet (25 mg total) by mouth 2 (two) times daily. 60 tablet 0  . TURMERIC PO Take 1 tablet by mouth daily.     No current facility-administered medications on file prior to visit.     PAST MEDICAL HISTORY: Past Medical History:  Diagnosis Date  . Anal fissure   . Arthritis   . Bursitis   . Constipation   . COPD (chronic obstructive pulmonary disease) ([place])   . Diabetes mellitus without complication ([place])    sees Dr. Cruzita Lederer   . Diverticulitis of  colon 02/2015  . Ectopic pregnancy   . Emphysema of lung ([place])   . Frozen shoulder   . Heart murmur   . Hyperlipidemia   . Hyperthyroidism   . Infertility, female   . Irregular heartbeat   . Joint pain   . Obesity   . Osteoarthritis   . Sleep apnea   . Swallowing difficulty   . Thyroid disease    hyperthyroid    PAST SURGICAL HISTORY: Past Surgical History:  Procedure Laterality Date  . APPENDECTOMY    . CESAREAN SECTION    . COLONOSCOPY  09-10-10   per Dr. Acquanetta Sit, diverticulosis of descending colon and sigmoid, internal hemorrhoids, repeat in 10 yrs  . COLOSTOMY N/A 02/20/2015   Procedure: COLOSTOMY;  Surgeon: Alison Confer, MD;  Location: [facility];  Service: General;  Laterality: N/A;  . COLOSTOMY REVISION  02/20/2015   Procedure: Sigmoid Colectomy;  Surgeon: Alison Confer, MD;  Location: [facility];  Service: General;;  . CYSTOSCOPY N/A 02/20/2015   Procedure: Alison Best;  Surgeon: Alison Rhodes, MD;  Location: [facility];  Service: Urology;   Laterality: N/A;  . DILATION AND CURETTAGE OF UTERUS     x3  . ILEO LOOP COLOSTOMY CLOSURE N/A 09/26/2015   Procedure: LAPAROSCOPIC LYSIS OF ADHESIONS, COLOSTOMY CLOSURE;  Surgeon: Alison Confer, MD;  Location: [facility];  Service: General;  Laterality: N/A;  . LAPAROTOMY N/A 02/20/2015   Procedure: Emergency EXPLORATORY and drainiage of interabdominal abcesses;  Surgeon: Alison Confer, MD;  Location: [facility];  Service: General;  Laterality: N/A;  . SALPINGOOPHORECTOMY Left 02/20/2015   Procedure: SALPINGO OOPHORECTOMY;  Surgeon: Alison Confer, MD;  Location: [facility];  Service: General;  Laterality: Left;  . TONSILLECTOMY  1980    SOCIAL HISTORY: Social History   Tobacco Use  . Smoking status: Current Every Day Smoker    Packs/day: 0.25    Years: 40.00    Pack years: 10.00    Types: Cigarettes  . Smokeless tobacco: Never Used  . Tobacco comment: less than 1/2 pack a day  Substance Use Topics  . Alcohol use: Yes    Alcohol/week: 0.0 standard drinks    Comment: occ  . Drug use: No    FAMILY HISTORY: Family History  Problem Relation Age of Onset  . Breast cancer Mother   . High blood pressure Mother   . Kidney disease Mother   . Sleep apnea Mother   . Obesity Mother   . CAD Father        CABG  . Skin cancer Father   . Lung cancer Father   . Diverticulitis Father   . Colon polyps Father   . High blood pressure Father   . Stroke Father   . Heart disease Father   . Sleep apnea Father   . COPD Other   . Cancer Other   . Heart disease Other   . Stroke Other   . Allergies Brother   . Breast cancer Maternal Grandmother   . Heart attack Maternal Grandfather   . Heart attack Paternal Grandfather   . Hypertension Neg Hx     ROS: Review of Systems  Constitutional: Positive for weight loss.  Gastrointestinal: Positive for constipation. Negative for nausea and vomiting.  Musculoskeletal:       Negative for muscle weakness  Psychiatric/Behavioral: Positive for  depression. Negative for suicidal ideas.    PHYSICAL EXAM: Pt in no acute distress  RECENT LABS AND TESTS: BMET    Component Value Date/Time  NA 142 01/11/2019 1136   K 4.4 01/11/2019 1136   CL 102 01/11/2019 1136   CO2 22 01/11/2019 1136   GLUCOSE 87 01/11/2019 1136   GLUCOSE 89 11/16/2017 1655   BUN 9 01/11/2019 1136   CREATININE 0.68 01/11/2019 1136   CREATININE 0.68 02/26/2016 1654   CALCIUM 9.5 01/11/2019 1136   GFRNONAA 95 01/11/2019 1136   GFRNONAA >89 02/26/2016 1654   GFRAA 109 01/11/2019 1136   GFRAA >89 02/26/2016 1654   Lab Results  Component Value Date   HGBA1C 6.2 (A) 03/23/2019   HGBA1C 5.7 (A) 09/20/2018   HGBA1C 5.5 09/30/2017   HGBA1C 6.3 04/01/2017   HGBA1C 6.2 10/01/2016   Lab Results  Component Value Date   INSULIN 21.2 01/11/2019   CBC    Component Value Date/Time   WBC 9.5 11/16/2017 1655   RBC 4.69 11/16/2017 1655   HGB 14.6 11/16/2017 1655   HCT 43.4 11/16/2017 1655   PLT 288.0 11/16/2017 1655   MCV 92.5 11/16/2017 1655   MCH 30.1 10/07/2015 0546   MCHC 33.5 11/16/2017 1655   RDW 15.3 11/16/2017 1655   LYMPHSABS 3.1 11/16/2017 1655   MONOABS 0.6 11/16/2017 1655   EOSABS 0.1 11/16/2017 1655   BASOSABS 0.1 11/16/2017 1655   Iron/TIBC/Ferritin/ %Sat No results found for: IRON, TIBC, FERRITIN, IRONPCTSAT Lipid Panel     Component Value Date/Time   CHOL 313 (H) 03/23/2019 1005   TRIG (H) 03/23/2019 1005    488.0 Triglyceride is over 400; calculations on Lipids are invalid.   HDL 24.00 (L) 03/23/2019 1005   CHOLHDL 13 03/23/2019 1005   VLDL 56.8 (H) 05/23/2014 0933   LDLCALC 93 11/02/2011 0947   LDLDIRECT 64.0 03/23/2019 1005   Hepatic Function Panel     Component Value Date/Time   PROT 7.0 01/11/2019 1136   ALBUMIN 4.6 01/11/2019 1136   AST 23 01/11/2019 1136   ALT 25 01/11/2019 1136   ALKPHOS 84 01/11/2019 1136   BILITOT 0.3 01/11/2019 1136   BILIDIR 0.1 11/16/2017 1655   IBILI 0.6 02/25/2015 0928      Component  Value Date/Time   TSH 2.26 03/23/2019 1005   TSH 2.44 09/20/2018 1118   TSH 4.06 11/09/2017 1557     Ref. Range 01/11/2019 11:36  Vitamin D, 25-Hydroxy Latest Ref Range: 30.0 - 100.0 ng/mL 27.3 (L)    I, Doreene Nest, am acting as Location manager for [employer]. Owens Shark, DO  I have reviewed the above documentation for accuracy and completeness, and I agree with the above. -Jearld Lesch, DO

## 2019-06-14 ENCOUNTER — Encounter (INDEPENDENT_AMBULATORY_CARE_PROVIDER_SITE_OTHER): Payer: Self-pay | Admitting: Bariatrics

## 2019-06-15 ENCOUNTER — Ambulatory Visit (INDEPENDENT_AMBULATORY_CARE_PROVIDER_SITE_OTHER): Payer: BC Managed Care – PPO | Admitting: Professional

## 2019-06-15 DIAGNOSIS — F4321 Adjustment disorder with depressed mood: Secondary | ICD-10-CM | POA: Diagnosis not present

## 2019-06-16 ENCOUNTER — Other Ambulatory Visit: Payer: Self-pay | Admitting: Internal Medicine

## 2019-06-16 DIAGNOSIS — R7303 Prediabetes: Secondary | ICD-10-CM

## 2019-06-18 ENCOUNTER — Other Ambulatory Visit (INDEPENDENT_AMBULATORY_CARE_PROVIDER_SITE_OTHER): Payer: Self-pay | Admitting: Bariatrics

## 2019-06-18 MED ORDER — TOPIRAMATE 25 MG PO TABS: 25.0000 mg | ORAL_TABLET | Freq: Two times a day (BID) | ORAL | 0 refills | Status: DC

## 2019-06-18 NOTE — Telephone Encounter (Signed)
Please review

## 2019-06-26 ENCOUNTER — Telehealth (INDEPENDENT_AMBULATORY_CARE_PROVIDER_SITE_OTHER): Payer: BC Managed Care – PPO | Admitting: Bariatrics

## 2019-06-26 ENCOUNTER — Other Ambulatory Visit: Payer: Self-pay

## 2019-06-26 ENCOUNTER — Encounter (INDEPENDENT_AMBULATORY_CARE_PROVIDER_SITE_OTHER): Payer: Self-pay | Admitting: Bariatrics

## 2019-06-26 DIAGNOSIS — F3289 Other specified depressive episodes: Secondary | ICD-10-CM

## 2019-06-26 DIAGNOSIS — Z6841 Body Mass Index (BMI) 40.0 and over, adult: Secondary | ICD-10-CM

## 2019-06-26 DIAGNOSIS — E559 Vitamin D deficiency, unspecified: Secondary | ICD-10-CM

## 2019-06-26 MED ORDER — VITAMIN D (ERGOCALCIFEROL) 1.25 MG (50000 UNIT) PO CAPS: 50000.0000 [IU] | ORAL_CAPSULE | ORAL | 0 refills | Status: DC

## 2019-06-26 NOTE — Progress Notes (Signed)
Office: [phone]  /  Fax: [phone] TeleHealth Visit:  Alison Best has verbally consented to this TeleHealth visit today. The patient is located at home, the provider is located at the [employer] [facility] office. The participants in this visit include the listed provider and patient. The visit was conducted today via telephone call (unable to connect to Webex and changed to telephone call).  HPI:   Chief Complaint: OBESITY Alison Best is here to discuss her progress with her obesity treatment plan. She is on the Category 2 plan and is following her eating plan approximately 50% of the time. She states she is doing physical therapy exercises 10 minutes 7 times per week. Alison Best states that her weight has remained the same (weight 229). She is doing well. We were unable to weigh the patient today for this TeleHealth visit. She feels as if she has maintained her weight since her last visit. She has lost 0 lbs since starting treatment with [name].  Vitamin D deficiency Alison Best has a diagnosis of Vitamin D deficiency. She is currently taking prescription Vit D and denies nausea, vomiting or muscle weakness.  Depression with emotional eating behaviors Alison Best is struggling with emotional eating and using food for comfort to the extent that it is negatively impacting her health. She often snacks when she is not hungry. Alison Best sometimes feels she is out of control and then feels guilty that she made poor food choices. She has been working on behavior modification techniques to help reduce her emotional eating and has been somewhat successful. Alison Best states she has resumed Topamax. She shows no sign of suicidal or homicidal ideations.  Depression screen [facility] 2/9 02/21/2019 01/11/2019 07/26/2017 02/05/2016 06/06/2014  Decreased Interest 0 2 0 0 0  Down, Depressed, Hopeless 1 1 0 0 0  PHQ - 2 Score 1 3 0 0 0  Altered sleeping 0 2 - - -  Tired, decreased energy 1 2 - - -  Change in appetite 1 1 - - -   Feeling bad or failure about yourself  0 1 - - -  Trouble concentrating 0 0 - - -  Moving slowly or fidgety/restless 0 2 - - -  Suicidal thoughts 0 0 - - -  PHQ-9 Score 3 11 - - -  Difficult doing work/chores - Somewhat difficult - - -  Some recent data might be hidden   ASSESSMENT AND PLAN:  Vitamin D deficiency - Plan: Vitamin D, Ergocalciferol, (DRISDOL) 1.25 MG (50000 UT) CAPS capsule, DISCONTINUED: Vitamin D, Ergocalciferol, (DRISDOL) 1.25 MG (50000 UT) CAPS capsule  Other depression - with emotional eating   Class 3 severe obesity with serious comorbidity and body mass index (BMI) of 40.0 to 44.9 in adult, unspecified obesity type (HCC)  PLAN:  Vitamin D Deficiency Alison Best was informed that low Vitamin D levels contributes to fatigue and are associated with obesity, breast, and colon cancer. She agrees to continue to take prescription Vit D @ 50,000 IU every week #4 with 0 refills and will follow-up for routine testing of Vitamin D, at least 2-3 times per year. She was informed of the risk of over-replacement of Vitamin D and agrees to not increase her dose unless she discusses this with [name] first. Alison Best agrees to follow-up with our clinic in 2 weeks.  Depression with Emotional Eating Behaviors We discussed CBT techniques today to help Alison Best deal with her emotional eating and depression.  Obesity Alison Best is currently in the action stage of change. As  such, her goal is to continue with weight loss efforts. She has agreed to follow the Category 2 plan. Alison Best will work on meal planning, intentional eating, and will adhere to her eating plan. Alison Best has been instructed to continue PT and increase mobility for weight loss and overall health benefits. We discussed the following Behavioral Modification Strategies today: increasing lean protein intake, decreasing simple carbohydrates, increasing vegetables, increase H20 intake, decrease eating out, no skipping meals, work on meal planning and easy  cooking plans, keeping healthy foods in the home, and planning for success.  Alison Best has agreed to follow-up with our clinic in 2 weeks. She was informed of the importance of frequent follow-up visits to maximize her success with intensive lifestyle modifications for her multiple health conditions.  ALLERGIES: Allergies  Allergen Reactions  . Levofloxacin In D5w Other (See Comments)    Achilles tendon weakness  . Mangifera Indica Anaphylaxis  . Mango Flavor Swelling  . Bupropion Nausea And Vomiting  . Levaquin [Levofloxacin] Other (See Comments)    Severe tendonitis Tolerates Cipro    MEDICATIONS: Current Outpatient Medications on File Prior to Visit  Medication Sig Dispense Refill  . acetaminophen (TYLENOL) 325 MG tablet Take 2 tablets (650 mg total) by mouth every 6 (six) hours as needed for moderate pain or headache.    . albuterol (PROVENTIL HFA;VENTOLIN HFA) 108 (90 Base) MCG/ACT inhaler INHALE 2 PUFFS INTO THE LUNGS EVERY 4 HOURS AS NEEDED FOR WHEEZING 8.5 g 6  . aspirin 81 MG tablet Take 81 mg by mouth daily.    . cefUROXime (CEFTIN) 500 MG tablet Take 1 tablet (500 mg total) by mouth 2 (two) times daily with a meal. (Patient not taking: Reported on 03/28/2019) 20 tablet 0  . Coenzyme Q10 (CO Q 10 PO) Take 1 capsule by mouth daily.    . diclofenac (VOLTAREN) 75 MG EC tablet TAKE 1 TABLET(75 MG) BY MOUTH TWICE DAILY (Patient not taking: Reported on 03/28/2019) 60 tablet 11  . diltiazem (CARDIZEM CD) 120 MG 24 hr capsule TAKE 1 CAPSULE(120 MG) BY MOUTH DAILY 90 capsule 3  . fluticasone (FLONASE) 50 MCG/ACT nasal spray Place 2 sprays into both nostrils daily. 16 g 6  . furosemide (LASIX) 20 MG tablet TAKE 1 TABLET BY MOUTH DAILY AS NEEDED FOR FLUID RETENTION 30 tablet 2  . glucose blood (CONTOUR NEXT TEST) test strip Use to check blood sugar once a day. 100 each 12  . metFORMIN (GLUCOPHAGE) 500 MG tablet TAKE 1 TABLET(500 MG) BY MOUTH TWICE DAILY WITH A MEAL 180 tablet 3  . methimazole  (TAPAZOLE) 5 MG tablet TAKE 1/2 TABLET( 2.5 MG) BY MOUTH DAILY 135 tablet 0  . metroNIDAZOLE (METROGEL) 1 % gel APPLY EXTERNALLY TO THE AFFECTED AREA DAILY 60 g 11  . Microlet Lancets MISC USE TO CHECK BLOOD SUGAR ONCE DAILY 100 each 0  . Multiple Vitamins-Minerals (MULTIVITAMIN WITH MINERALS) tablet Take 1 tablet by mouth daily.    . multivitamin-lutein (OCUVITE-LUTEIN) CAPS capsule Take 1 capsule by mouth daily.    . Omega-3 Fatty Acids (FISH OIL PO) Take 1 capsule by mouth daily.    . polyethylene glycol powder (GLYCOLAX/MIRALAX) 17 GM/SCOOP powder Take 17 g by mouth daily. 3350 g 0  . topiramate (TOPAMAX) 25 MG tablet Take 1 tablet (25 mg total) by mouth 2 (two) times daily. 60 tablet 0  . TURMERIC PO Take 1 tablet by mouth daily.     No current facility-administered medications on file prior to visit.  PAST MEDICAL HISTORY: Past Medical History:  Diagnosis Date  . Anal fissure   . Arthritis   . Bursitis   . Constipation   . COPD (chronic obstructive pulmonary disease) ([place])   . Diabetes mellitus without complication ([place])    sees Dr. Cruzita Lederer   . Diverticulitis of colon 02/2015  . Ectopic pregnancy   . Emphysema of lung ([place])   . Frozen shoulder   . Heart murmur   . Hyperlipidemia   . Hyperthyroidism   . Infertility, female   . Irregular heartbeat   . Joint pain   . Obesity   . Osteoarthritis   . Sleep apnea   . Swallowing difficulty   . Thyroid disease    hyperthyroid    PAST SURGICAL HISTORY: Past Surgical History:  Procedure Laterality Date  . APPENDECTOMY    . CESAREAN SECTION    . COLONOSCOPY  09-10-10   per Dr. Acquanetta Sit, diverticulosis of descending colon and sigmoid, internal hemorrhoids, repeat in 10 yrs  . COLOSTOMY N/A 02/20/2015   Procedure: COLOSTOMY;  Surgeon: Jackolyn Confer, MD;  Location: [facility];  Service: General;  Laterality: N/A;  . COLOSTOMY REVISION  02/20/2015   Procedure: Sigmoid Colectomy;  Surgeon: Jackolyn Confer, MD;  Location: [facility];  Service: General;;  . CYSTOSCOPY N/A 02/20/2015   Procedure: Consuela Mimes;  Surgeon: Kathie Rhodes, MD;  Location: [facility];  Service: Urology;  Laterality: N/A;  . DILATION AND CURETTAGE OF UTERUS     x3  . ILEO LOOP COLOSTOMY CLOSURE N/A 09/26/2015   Procedure: LAPAROSCOPIC LYSIS OF ADHESIONS, COLOSTOMY CLOSURE;  Surgeon: Jackolyn Confer, MD;  Location: [facility];  Service: General;  Laterality: N/A;  . LAPAROTOMY N/A 02/20/2015   Procedure: Emergency EXPLORATORY and drainiage of interabdominal abcesses;  Surgeon: Jackolyn Confer, MD;  Location: [facility];  Service: General;  Laterality: N/A;  . SALPINGOOPHORECTOMY Left 02/20/2015   Procedure: SALPINGO OOPHORECTOMY;  Surgeon: Jackolyn Confer, MD;  Location: [facility];  Service: General;  Laterality: Left;  . TONSILLECTOMY  1980    SOCIAL HISTORY: Social History   Tobacco Use  . Smoking status: Current Every Day Smoker    Packs/day: 0.25    Years: 40.00    Pack years: 10.00    Types: Cigarettes  . Smokeless tobacco: Never Used  . Tobacco comment: less than 1/2 pack a day  Substance Use Topics  . Alcohol use: Yes    Alcohol/week: 0.0 standard drinks    Comment: occ  . Drug use: No    FAMILY HISTORY: Family History  Problem Relation Age of Onset  . Breast cancer Mother   . High blood pressure Mother   . Kidney disease Mother   . Sleep apnea Mother   . Obesity Mother   . CAD Father        CABG  . Skin cancer Father   . Lung cancer Father   . Diverticulitis Father   . Colon polyps Father   . High blood pressure Father   . Stroke Father   . Heart disease Father   . Sleep apnea Father   . COPD Other   . Cancer Other   . Heart disease Other   . Stroke Other   . Allergies Brother   . Breast cancer Maternal Grandmother   . Heart attack Maternal Grandfather   . Heart attack Paternal Grandfather   . Hypertension Neg Hx    ROS: Review of Systems  Gastrointestinal: Negative for nausea and vomiting.  Musculoskeletal:        Negative for muscle weakness.  Psychiatric/Behavioral: Positive for depression (emotional eating). Negative for suicidal ideas.       Negative for homicidal ideas.   PHYSICAL EXAM: Pt in no acute distress  RECENT LABS AND TESTS: BMET    Component Value Date/Time   NA 142 01/11/2019 1136   K 4.4 01/11/2019 1136   CL 102 01/11/2019 1136   CO2 22 01/11/2019 1136   GLUCOSE 87 01/11/2019 1136   GLUCOSE 89 11/16/2017 1655   BUN 9 01/11/2019 1136   CREATININE 0.68 01/11/2019 1136   CREATININE 0.68 02/26/2016 1654   CALCIUM 9.5 01/11/2019 1136   GFRNONAA 95 01/11/2019 1136   GFRNONAA >89 02/26/2016 1654   GFRAA 109 01/11/2019 1136   GFRAA >89 02/26/2016 1654   Lab Results  Component Value Date   HGBA1C 6.2 (A) 03/23/2019   HGBA1C 5.7 (A) 09/20/2018   HGBA1C 5.5 09/30/2017   HGBA1C 6.3 04/01/2017   HGBA1C 6.2 10/01/2016   Lab Results  Component Value Date   INSULIN 21.2 01/11/2019   CBC    Component Value Date/Time   WBC 9.5 11/16/2017 1655   RBC 4.69 11/16/2017 1655   HGB 14.6 11/16/2017 1655   HCT 43.4 11/16/2017 1655   PLT 288.0 11/16/2017 1655   MCV 92.5 11/16/2017 1655   MCH 30.1 10/07/2015 0546   MCHC 33.5 11/16/2017 1655   RDW 15.3 11/16/2017 1655   LYMPHSABS 3.1 11/16/2017 1655   MONOABS 0.6 11/16/2017 1655   EOSABS 0.1 11/16/2017 1655   BASOSABS 0.1 11/16/2017 1655   Iron/TIBC/Ferritin/ %Sat No results found for: IRON, TIBC, FERRITIN, IRONPCTSAT Lipid Panel     Component Value Date/Time   CHOL 313 (H) 03/23/2019 1005   TRIG (H) 03/23/2019 1005    488.0 Triglyceride is over 400; calculations on Lipids are invalid.   HDL 24.00 (L) 03/23/2019 1005   CHOLHDL 13 03/23/2019 1005   VLDL 56.8 (H) 05/23/2014 0933   LDLCALC 93 11/02/2011 0947   LDLDIRECT 64.0 03/23/2019 1005   Hepatic Function Panel     Component Value Date/Time   PROT 7.0 01/11/2019 1136   ALBUMIN 4.6 01/11/2019 1136   AST 23 01/11/2019 1136   ALT 25 01/11/2019 1136   ALKPHOS 84  01/11/2019 1136   BILITOT 0.3 01/11/2019 1136   BILIDIR 0.1 11/16/2017 1655   IBILI 0.6 02/25/2015 0928      Component Value Date/Time   TSH 2.26 03/23/2019 1005   TSH 2.44 09/20/2018 1118   TSH 4.06 11/09/2017 1557   Results for CLAUDINA, DOBNER (MRN [id number]) as of 06/26/2019 14:25  Ref. Range 01/11/2019 11:36  Vitamin D, 25-Hydroxy Latest Ref Range: 30.0 - 100.0 ng/mL 27.3 (L)   I, Michaelene Song, am acting as Location manager for [employer], DO  I have reviewed the above documentation for accuracy and completeness, and I agree with the above. -Jearld Lesch, DO

## 2019-06-29 ENCOUNTER — Ambulatory Visit (INDEPENDENT_AMBULATORY_CARE_PROVIDER_SITE_OTHER): Payer: BC Managed Care – PPO | Admitting: Professional

## 2019-06-29 DIAGNOSIS — F4321 Adjustment disorder with depressed mood: Secondary | ICD-10-CM

## 2019-07-06 ENCOUNTER — Ambulatory Visit: Payer: BC Managed Care – PPO | Admitting: Professional

## 2019-07-07 ENCOUNTER — Ambulatory Visit (INDEPENDENT_AMBULATORY_CARE_PROVIDER_SITE_OTHER): Payer: Self-pay | Admitting: Professional

## 2019-07-07 DIAGNOSIS — F4321 Adjustment disorder with depressed mood: Secondary | ICD-10-CM

## 2019-07-13 ENCOUNTER — Ambulatory Visit: Payer: BC Managed Care – PPO | Admitting: Professional

## 2019-07-14 ENCOUNTER — Ambulatory Visit (INDEPENDENT_AMBULATORY_CARE_PROVIDER_SITE_OTHER): Payer: BC Managed Care – PPO | Admitting: Professional

## 2019-07-14 DIAGNOSIS — F4321 Adjustment disorder with depressed mood: Secondary | ICD-10-CM

## 2019-07-15 DIAGNOSIS — Z23 Encounter for immunization: Secondary | ICD-10-CM | POA: Diagnosis not present

## 2019-07-17 ENCOUNTER — Encounter (INDEPENDENT_AMBULATORY_CARE_PROVIDER_SITE_OTHER): Payer: Self-pay | Admitting: Bariatrics

## 2019-07-17 ENCOUNTER — Other Ambulatory Visit: Payer: Self-pay

## 2019-07-17 ENCOUNTER — Ambulatory Visit (INDEPENDENT_AMBULATORY_CARE_PROVIDER_SITE_OTHER): Payer: BC Managed Care – PPO | Admitting: Bariatrics

## 2019-07-17 ENCOUNTER — Encounter: Payer: Self-pay | Admitting: Internal Medicine

## 2019-07-17 DIAGNOSIS — Z6841 Body Mass Index (BMI) 40.0 and over, adult: Secondary | ICD-10-CM

## 2019-07-17 DIAGNOSIS — F3289 Other specified depressive episodes: Secondary | ICD-10-CM

## 2019-07-17 DIAGNOSIS — R7303 Prediabetes: Secondary | ICD-10-CM | POA: Diagnosis not present

## 2019-07-17 MED ORDER — METFORMIN HCL 500 MG PO TABS: ORAL_TABLET | ORAL | 0 refills | Status: DC

## 2019-07-18 ENCOUNTER — Encounter (INDEPENDENT_AMBULATORY_CARE_PROVIDER_SITE_OTHER): Payer: Self-pay | Admitting: Bariatrics

## 2019-07-18 NOTE — Progress Notes (Signed)
Office: [phone]  /  Fax: [phone] TeleHealth Visit:  Alison Best has verbally consented to this TeleHealth visit today. The patient is located at home, the provider is located at the [facility] office. The participants in this visit include the listed provider and patient. The visit was conducted today via Webex.  HPI:   Chief Complaint: OBESITY Alison Best is here to discuss her progress with her obesity treatment plan. She is on the Category 2 plan and is following her eating plan approximately 60% of the time. She states she is doing physical therapy exercises 10 minutes 7 times per week. Alison Best is down 3 lbs (weight 226).  She feels like "things are doing well." She is enjoying her PT sessions. She reports she is drinking more water. We were unable to weigh the patient today for this TeleHealth visit. She feels as if she has lost 3 lbs since her last visit. She has lost 0 lbs since starting treatment with [name].  Depression with emotional eating behaviors Alison Best is struggling with emotional eating and using food for comfort to the extent that it is negatively impacting her health. She often snacks when she is not hungry. Alison Best sometimes feels she is out of control and then feels guilty that she made poor food choices. She has been working on behavior modification techniques to help reduce her emotional eating and has been somewhat successful. Alison Best is taking Topamax and reports decreased cravings. She shows no sign of suicidal or homicidal ideations.  Depression screen [facility] 2/9 02/21/2019 01/11/2019 07/26/2017 02/05/2016 06/06/2014  Decreased Interest 0 2 0 0 0  Down, Depressed, Hopeless 1 1 0 0 0  PHQ - 2 Score 1 3 0 0 0  Altered sleeping 0 2 - - -  Tired, decreased energy 1 2 - - -  Change in appetite 1 1 - - -  Feeling bad or failure about yourself  0 1 - - -  Trouble concentrating 0 0 - - -  Moving slowly or fidgety/restless 0 2 - - -  Suicidal thoughts 0 0 - - -   PHQ-9 Score 3 11 - - -  Difficult doing work/chores - Somewhat difficult - - -  Some recent data might be hidden   Pre-Diabetes Alison Best has a diagnosis of prediabetes based on her elevated Hgb A1c and was informed this puts her at greater risk of developing diabetes. She is taking metformin currently and continues to work on diet and exercise to decrease risk of diabetes. She denies nausea or hypoglycemia.  ASSESSMENT AND PLAN:  Other depression  Prediabetes  Class 3 severe obesity with serious comorbidity and body mass index (BMI) of 40.0 to 44.9 in adult, unspecified obesity type ([place])  PLAN:  Depression with Emotional Eating Behaviors We discussed behavior modification techniques today to help Alison Best deal with her emotional eating and depression. Alison Best will continue Topamax and will continue to see Francie Massing (computer). She agrees to follow-up as directed.  Pre-Diabetes Alison Best will continue to work on weight loss, exercise, and decreasing simple carbohydrates in her diet to help decrease the risk of diabetes. We dicussed metformin including benefits and risks. She was informed that eating too many simple carbohydrates or too many calories at one sitting increases the likelihood of GI side effects. Alison Best will continue metformin and follow-up as directed to monitor her progress.  Obesity Alison Best is currently in the action stage of change. As such, her goal is to continue with weight loss  efforts. She has agreed to follow the Category 2 plan. Alison Best will work on meal planning, intentional eating, increasing water intake to 64 ounces daily, and increasing protein to 75-80 grams a day. Alison Best has been instructed to continue PT 10 minutes 7 days a week for weight loss and overall health benefits. We discussed the following Behavioral Modification Strategies today: increasing lean protein intake, decreasing simple carbohydrates, increasing vegetables, increase H20 intake, decrease eating out, no  skipping meals, work on meal planning and easy cooking plans, and keeping healthy foods in the home.  Alison Best has agreed to follow up with our clinic in 2 weeks. She was informed of the importance of frequent follow up visits to maximize her success with intensive lifestyle modifications for her multiple health conditions.  ALLERGIES: Allergies  Allergen Reactions  . Levofloxacin In D5w Other (See Comments)    Achilles tendon weakness  . Mangifera Indica Anaphylaxis  . Mango Flavor Swelling  . Bupropion Nausea And Vomiting  . Levaquin [Levofloxacin] Other (See Comments)    Severe tendonitis Tolerates Cipro    MEDICATIONS: Current Outpatient Medications on File Prior to Visit  Medication Sig Dispense Refill  . acetaminophen (TYLENOL) 325 MG tablet Take 2 tablets (650 mg total) by mouth every 6 (six) hours as needed for moderate pain or headache.    . albuterol (PROVENTIL HFA;VENTOLIN HFA) 108 (90 Base) MCG/ACT inhaler INHALE 2 PUFFS INTO THE LUNGS EVERY 4 HOURS AS NEEDED FOR WHEEZING 8.5 g 6  . aspirin 81 MG tablet Take 81 mg by mouth daily.    . cefUROXime (CEFTIN) 500 MG tablet Take 1 tablet (500 mg total) by mouth 2 (two) times daily with a meal. (Patient not taking: Reported on 03/28/2019) 20 tablet 0  . Coenzyme Q10 (CO Q 10 PO) Take 1 capsule by mouth daily.    . diclofenac (VOLTAREN) 75 MG EC tablet TAKE 1 TABLET(75 MG) BY MOUTH TWICE DAILY (Patient not taking: Reported on 03/28/2019) 60 tablet 11  . diltiazem (CARDIZEM CD) 120 MG 24 hr capsule TAKE 1 CAPSULE(120 MG) BY MOUTH DAILY 90 capsule 3  . fluticasone (FLONASE) 50 MCG/ACT nasal spray Place 2 sprays into both nostrils daily. 16 g 6  . furosemide (LASIX) 20 MG tablet TAKE 1 TABLET BY MOUTH DAILY AS NEEDED FOR FLUID RETENTION 30 tablet 2  . glucose blood (CONTOUR NEXT TEST) test strip Use to check blood sugar once a day. 100 each 12  . methimazole (TAPAZOLE) 5 MG tablet TAKE 1/2 TABLET( 2.5 MG) BY MOUTH DAILY 135 tablet 0  .  metroNIDAZOLE (METROGEL) 1 % gel APPLY EXTERNALLY TO THE AFFECTED AREA DAILY 60 g 11  . Microlet Lancets MISC USE TO CHECK BLOOD SUGAR ONCE DAILY 100 each 0  . Multiple Vitamins-Minerals (MULTIVITAMIN WITH MINERALS) tablet Take 1 tablet by mouth daily.    . multivitamin-lutein (OCUVITE-LUTEIN) CAPS capsule Take 1 capsule by mouth daily.    . Omega-3 Fatty Acids (FISH OIL PO) Take 1 capsule by mouth daily.    . polyethylene glycol powder (GLYCOLAX/MIRALAX) 17 GM/SCOOP powder Take 17 g by mouth daily. 3350 g 0  . topiramate (TOPAMAX) 25 MG tablet Take 1 tablet (25 mg total) by mouth 2 (two) times daily. 60 tablet 0  . TURMERIC PO Take 1 tablet by mouth daily.    . Vitamin D, Ergocalciferol, (DRISDOL) 1.25 MG (50000 UT) CAPS capsule Take 1 capsule (50,000 Units total) by mouth every 7 (seven) days. 4 capsule 0   No current facility-administered  medications on file prior to visit.     PAST MEDICAL HISTORY: Past Medical History:  Diagnosis Date  . Anal fissure   . Arthritis   . Bursitis   . Constipation   . COPD (chronic obstructive pulmonary disease) ([place])   . Diabetes mellitus without complication ([place])    sees Dr. Cruzita Lederer   . Diverticulitis of colon 02/2015  . Ectopic pregnancy   . Emphysema of lung ([place])   . Frozen shoulder   . Heart murmur   . Hyperlipidemia   . Hyperthyroidism   . Infertility, female   . Irregular heartbeat   . Joint pain   . Obesity   . Osteoarthritis   . Sleep apnea   . Swallowing difficulty   . Thyroid disease    hyperthyroid    PAST SURGICAL HISTORY: Past Surgical History:  Procedure Laterality Date  . APPENDECTOMY    . CESAREAN SECTION    . COLONOSCOPY  09-10-10   per Dr. Acquanetta Sit, diverticulosis of descending colon and sigmoid, internal hemorrhoids, repeat in 10 yrs  . COLOSTOMY N/A 02/20/2015   Procedure: COLOSTOMY;  Surgeon: Jackolyn Confer, MD;  Location: [facility];  Service: General;  Laterality: N/A;  . COLOSTOMY REVISION  02/20/2015    Procedure: Sigmoid Colectomy;  Surgeon: Jackolyn Confer, MD;  Location: [facility];  Service: General;;  . CYSTOSCOPY N/A 02/20/2015   Procedure: Consuela Mimes;  Surgeon: Kathie Rhodes, MD;  Location: [facility];  Service: Urology;  Laterality: N/A;  . DILATION AND CURETTAGE OF UTERUS     x3  . ILEO LOOP COLOSTOMY CLOSURE N/A 09/26/2015   Procedure: LAPAROSCOPIC LYSIS OF ADHESIONS, COLOSTOMY CLOSURE;  Surgeon: Jackolyn Confer, MD;  Location: [facility];  Service: General;  Laterality: N/A;  . LAPAROTOMY N/A 02/20/2015   Procedure: Emergency EXPLORATORY and drainiage of interabdominal abcesses;  Surgeon: Jackolyn Confer, MD;  Location: [facility];  Service: General;  Laterality: N/A;  . SALPINGOOPHORECTOMY Left 02/20/2015   Procedure: SALPINGO OOPHORECTOMY;  Surgeon: Jackolyn Confer, MD;  Location: [facility];  Service: General;  Laterality: Left;  . TONSILLECTOMY  1980    SOCIAL HISTORY: Social History   Tobacco Use  . Smoking status: Current Every Day Smoker    Packs/day: 0.25    Years: 40.00    Pack years: 10.00    Types: Cigarettes  . Smokeless tobacco: Never Used  . Tobacco comment: less than 1/2 pack a day  Substance Use Topics  . Alcohol use: Yes    Alcohol/week: 0.0 standard drinks    Comment: occ  . Drug use: No    FAMILY HISTORY: Family History  Problem Relation Age of Onset  . Breast cancer Mother   . High blood pressure Mother   . Kidney disease Mother   . Sleep apnea Mother   . Obesity Mother   . CAD Father        CABG  . Skin cancer Father   . Lung cancer Father   . Diverticulitis Father   . Colon polyps Father   . High blood pressure Father   . Stroke Father   . Heart disease Father   . Sleep apnea Father   . COPD Other   . Cancer Other   . Heart disease Other   . Stroke Other   . Allergies Brother   . Breast cancer Maternal Grandmother   . Heart attack Maternal Grandfather   . Heart attack Paternal Grandfather   . Hypertension Neg Hx    ROS: Review  of Systems   Gastrointestinal: Negative for nausea.  Endo/Heme/Allergies:       Negative for hypoglycemia.  Psychiatric/Behavioral: Positive for depression (emotional eating). Negative for suicidal ideas.       Negative for homicidal ideas.   PHYSICAL EXAM: Pt in no acute distress  RECENT LABS AND TESTS: BMET    Component Value Date/Time   NA 142 01/11/2019 1136   K 4.4 01/11/2019 1136   CL 102 01/11/2019 1136   CO2 22 01/11/2019 1136   GLUCOSE 87 01/11/2019 1136   GLUCOSE 89 11/16/2017 1655   BUN 9 01/11/2019 1136   CREATININE 0.68 01/11/2019 1136   CREATININE 0.68 02/26/2016 1654   CALCIUM 9.5 01/11/2019 1136   GFRNONAA 95 01/11/2019 1136   GFRNONAA >89 02/26/2016 1654   GFRAA 109 01/11/2019 1136   GFRAA >89 02/26/2016 1654   Lab Results  Component Value Date   HGBA1C 6.2 (A) 03/23/2019   HGBA1C 5.7 (A) 09/20/2018   HGBA1C 5.5 09/30/2017   HGBA1C 6.3 04/01/2017   HGBA1C 6.2 10/01/2016   Lab Results  Component Value Date   INSULIN 21.2 01/11/2019   CBC    Component Value Date/Time   WBC 9.5 11/16/2017 1655   RBC 4.69 11/16/2017 1655   HGB 14.6 11/16/2017 1655   HCT 43.4 11/16/2017 1655   PLT 288.0 11/16/2017 1655   MCV 92.5 11/16/2017 1655   MCH 30.1 10/07/2015 0546   MCHC 33.5 11/16/2017 1655   RDW 15.3 11/16/2017 1655   LYMPHSABS 3.1 11/16/2017 1655   MONOABS 0.6 11/16/2017 1655   EOSABS 0.1 11/16/2017 1655   BASOSABS 0.1 11/16/2017 1655   Iron/TIBC/Ferritin/ %Sat No results found for: IRON, TIBC, FERRITIN, IRONPCTSAT Lipid Panel     Component Value Date/Time   CHOL 313 (H) 03/23/2019 1005   TRIG (H) 03/23/2019 1005    488.0 Triglyceride is over 400; calculations on Lipids are invalid.   HDL 24.00 (L) 03/23/2019 1005   CHOLHDL 13 03/23/2019 1005   VLDL 56.8 (H) 05/23/2014 0933   LDLCALC 93 11/02/2011 0947   LDLDIRECT 64.0 03/23/2019 1005   Hepatic Function Panel     Component Value Date/Time   PROT 7.0 01/11/2019 1136   ALBUMIN 4.6 01/11/2019 1136    AST 23 01/11/2019 1136   ALT 25 01/11/2019 1136   ALKPHOS 84 01/11/2019 1136   BILITOT 0.3 01/11/2019 1136   BILIDIR 0.1 11/16/2017 1655   IBILI 0.6 02/25/2015 0928      Component Value Date/Time   TSH 2.26 03/23/2019 1005   TSH 2.44 09/20/2018 1118   TSH 4.06 11/09/2017 1557   Results for CELSA, FROGGE (MRN [id number]) as of 07/18/2019 11:40  Ref. Range 01/11/2019 11:36  Vitamin D, 25-Hydroxy Latest Ref Range: 30.0 - 100.0 ng/mL 27.3 (L)   I, Michaelene Song, am acting as Location manager for [employer], DO  I have reviewed the above documentation for accuracy and completeness, and I agree with the above. -Jearld Lesch, DO

## 2019-07-20 ENCOUNTER — Ambulatory Visit: Payer: BC Managed Care – PPO | Admitting: Professional

## 2019-07-22 ENCOUNTER — Other Ambulatory Visit (INDEPENDENT_AMBULATORY_CARE_PROVIDER_SITE_OTHER): Payer: Self-pay | Admitting: Bariatrics

## 2019-07-28 ENCOUNTER — Ambulatory Visit (INDEPENDENT_AMBULATORY_CARE_PROVIDER_SITE_OTHER): Payer: BC Managed Care – PPO | Admitting: Professional

## 2019-07-28 DIAGNOSIS — F4321 Adjustment disorder with depressed mood: Secondary | ICD-10-CM

## 2019-07-31 ENCOUNTER — Encounter (INDEPENDENT_AMBULATORY_CARE_PROVIDER_SITE_OTHER): Payer: Self-pay | Admitting: Bariatrics

## 2019-07-31 ENCOUNTER — Ambulatory Visit (INDEPENDENT_AMBULATORY_CARE_PROVIDER_SITE_OTHER): Payer: BC Managed Care – PPO | Admitting: Bariatrics

## 2019-07-31 ENCOUNTER — Other Ambulatory Visit: Payer: Self-pay

## 2019-07-31 DIAGNOSIS — R7303 Prediabetes: Secondary | ICD-10-CM | POA: Diagnosis not present

## 2019-07-31 DIAGNOSIS — E781 Pure hyperglyceridemia: Secondary | ICD-10-CM | POA: Diagnosis not present

## 2019-07-31 DIAGNOSIS — E559 Vitamin D deficiency, unspecified: Secondary | ICD-10-CM | POA: Diagnosis not present

## 2019-07-31 DIAGNOSIS — E66813 Obesity, class 3: Secondary | ICD-10-CM

## 2019-07-31 DIAGNOSIS — Z6841 Body Mass Index (BMI) 40.0 and over, adult: Secondary | ICD-10-CM

## 2019-07-31 DIAGNOSIS — F3289 Other specified depressive episodes: Secondary | ICD-10-CM | POA: Diagnosis not present

## 2019-07-31 MED ORDER — VITAMIN D (ERGOCALCIFEROL) 1.25 MG (50000 UNIT) PO CAPS: 50000.0000 [IU] | ORAL_CAPSULE | ORAL | 0 refills | Status: DC

## 2019-07-31 MED ORDER — TOPIRAMATE 25 MG PO TABS: 25.0000 mg | ORAL_TABLET | Freq: Two times a day (BID) | ORAL | 0 refills | Status: DC

## 2019-07-31 NOTE — Progress Notes (Signed)
Office: [phone]  /  Fax: [phone] TeleHealth Visit:  Alison Best has verbally consented to this TeleHealth visit today. The patient is located at home, the provider is located at the [employer] [facility] office. The participants in this visit include the listed provider and patient. The visit was conducted today via Webex.  HPI:   Chief Complaint: OBESITY Alison Best is here to discuss her progress with her obesity treatment plan. She is on the Category 2 plan and is following her eating plan approximately 85% of the time. She states she is doing physical therapy 10 minutes 7 times per week. Alison Best is down 3 lbs and doing better overall. She is focusing in on the protein. We were unable to weigh the patient today for this TeleHealth visit. She feels as if she has lost 3 lbs since her last visit. She has lost 0 lbs since starting treatment with [name].  Pre-Diabetes Alison Best has a diagnosis of prediabetes based on her elevated Hgb A1c and was informed this puts her at greater risk of developing diabetes. Last A1c 6.2 on 03/23/2019. She continues to work on diet and exercise to decrease risk of diabetes. She denies nausea or hypoglycemia. No polyphagia.  Hypertriglyceridemia Alison Best has a diagnosis of hypertriglyceridemia with a triglyceride level of 488.0 on 03/23/2019.  Depression with emotional eating behaviors Alison Best is struggling with emotional eating and using food for comfort to the extent that it is negatively impacting her health. She often snacks when she is not hungry. Aamia sometimes feels she is out of control and then feels guilty that she made poor food choices. She has been working on behavior modification techniques to help reduce her emotional eating and has been somewhat successful. She shows no sign of suicidal or homicidal ideations.  Depression screen [facility] 2/9 02/21/2019 01/11/2019 07/26/2017 02/05/2016 06/06/2014  Decreased Interest 0 2 0 0 0  Down, Depressed, Hopeless 1  1 0 0 0  PHQ - 2 Score 1 3 0 0 0  Altered sleeping 0 2 - - -  Tired, decreased energy 1 2 - - -  Change in appetite 1 1 - - -  Feeling bad or failure about yourself  0 1 - - -  Trouble concentrating 0 0 - - -  Moving slowly or fidgety/restless 0 2 - - -  Suicidal thoughts 0 0 - - -  PHQ-9 Score 3 11 - - -  Difficult doing work/chores - Somewhat difficult - - -  Some recent data might be hidden   Vitamin D deficiency Alison Best has a diagnosis of Vitamin D deficiency. She is currently taking prescription Vit D and denies nausea, vomiting or muscle weakness.  ASSESSMENT AND PLAN:  Prediabetes  Hypertriglyceridemia  Vitamin D deficiency - Plan: Vitamin D, Ergocalciferol, (DRISDOL) 1.25 MG (50000 UT) CAPS capsule  Other depression  Class 3 severe obesity with serious comorbidity and body mass index (BMI) of 40.0 to 44.9 in adult, unspecified obesity type [facility])  PLAN:  Pre-Diabetes Alison Best will continue to work on weight loss, exercise, and decreasing simple carbohydrates in her diet to help decrease the risk of diabetes. We dicussed metformin including benefits and risks. She was informed that eating too many simple carbohydrates or too many calories at one sitting increases the likelihood of GI side effects. [name] was instructed to decrease carbohydrates, increase protein, and increase activity. She will follow-up with [name] as directed to monitor her progress.  Hypertriglyceridemia Alison Best was instructed to decrease carbohydrates, increase protein,  and healthy fats. She will follow-up with [name] as directed to monitor her progress.  Depression with Emotional Eating Behaviors We discussed behavior modification techniques today to help [name] deal with her emotional eating and depression. [name] was given a prescription for Topamax 25 mg 1 PO BID #60 with 0 refills and agrees to follow-up with our clinic in 2 weeks. She will continue to meet with her counselor.  Vitamin D Deficiency Alison Best was informed  that low Vitamin D levels contributes to fatigue and are associated with obesity, breast, and colon cancer. She agrees to continue to take prescription Vit D @ 50,000 IU every week #4 with 0 refills and will follow-up for routine testing of Vitamin D, at least 2-3 times per year. She was informed of the risk of over-replacement of Vitamin D and agrees to not increase her dose unless she discusses this with [name] first. [name] agrees to follow-up with our clinic in 2 weeks.  Obesity Alison Best is currently in the action stage of change. As such, her goal is to continue with weight loss efforts. She has agreed to follow the Category 2 plan. Alison Best will work on meal planning, intentional eating, increasing her protein (building the meal around the protein). Alison Best has been instructed to begin short-term walking for weight loss and overall health benefits. We discussed the following Behavioral Modification Strategies today: increasing lean protein intake, decreasing simple carbohydrates, increasing vegetables, increase H20 intake, decrease eating out, no skipping meals, work on meal planning and easy cooking plans, keeping healthy foods in the home, and planning for success.  Alison Best has agreed to follow-up with our clinic in 2 weeks. She was informed of the importance of frequent follow-up visits to maximize her success with intensive lifestyle modifications for her multiple health conditions.  ALLERGIES: Allergies  Allergen Reactions   Levofloxacin In D5w Other (See Comments)    Achilles tendon weakness   Mangifera Indica Anaphylaxis   Mango Flavor Swelling   Bupropion Nausea And Vomiting   Levaquin [Levofloxacin] Other (See Comments)    Severe tendonitis Tolerates Cipro    MEDICATIONS: Current Outpatient Medications on File Prior to Visit  Medication Sig Dispense Refill   acetaminophen (TYLENOL) 325 MG tablet Take 2 tablets (650 mg total) by mouth every 6 (six) hours as needed for moderate pain or  headache.     albuterol (PROVENTIL HFA;VENTOLIN HFA) 108 (90 Base) MCG/ACT inhaler INHALE 2 PUFFS INTO THE LUNGS EVERY 4 HOURS AS NEEDED FOR WHEEZING 8.5 g 6   aspirin 81 MG tablet Take 81 mg by mouth daily.     cefUROXime (CEFTIN) 500 MG tablet Take 1 tablet (500 mg total) by mouth 2 (two) times daily with a meal. (Patient not taking: Reported on 03/28/2019) 20 tablet 0   Coenzyme Q10 (CO Q 10 PO) Take 1 capsule by mouth daily.     diclofenac (VOLTAREN) 75 MG EC tablet TAKE 1 TABLET(75 MG) BY MOUTH TWICE DAILY (Patient not taking: Reported on 03/28/2019) 60 tablet 11   diltiazem (CARDIZEM CD) 120 MG 24 hr capsule TAKE 1 CAPSULE(120 MG) BY MOUTH DAILY 90 capsule 3   fluticasone (FLONASE) 50 MCG/ACT nasal spray Place 2 sprays into both nostrils daily. 16 g 6   furosemide (LASIX) 20 MG tablet TAKE 1 TABLET BY MOUTH DAILY AS NEEDED FOR FLUID RETENTION 30 tablet 2   glucose blood (CONTOUR NEXT TEST) test strip Use to check blood sugar once a day. 100 each 12   metFORMIN (GLUCOPHAGE) 500 MG tablet  TAKE 1 TABLET(500 MG) BY MOUTH TWICE DAILY WITH A MEAL 180 tablet 0   methimazole (TAPAZOLE) 5 MG tablet TAKE 1/2 TABLET( 2.5 MG) BY MOUTH DAILY 135 tablet 0   metroNIDAZOLE (METROGEL) 1 % gel APPLY EXTERNALLY TO THE AFFECTED AREA DAILY 60 g 11   Microlet Lancets MISC USE TO CHECK BLOOD SUGAR ONCE DAILY 100 each 0   Multiple Vitamins-Minerals (MULTIVITAMIN WITH MINERALS) tablet Take 1 tablet by mouth daily.     multivitamin-lutein (OCUVITE-LUTEIN) CAPS capsule Take 1 capsule by mouth daily.     Omega-3 Fatty Acids (FISH OIL PO) Take 1 capsule by mouth daily.     polyethylene glycol powder (GLYCOLAX/MIRALAX) 17 GM/SCOOP powder Take 17 g by mouth daily. 3350 g 0   TURMERIC PO Take 1 tablet by mouth daily.     No current facility-administered medications on file prior to visit.     PAST MEDICAL HISTORY: Past Medical History:  Diagnosis Date   Anal fissure    Arthritis    Bursitis     Constipation    COPD (chronic obstructive pulmonary disease) (HCC)    Diabetes mellitus without complication ([place])    sees Dr. Cruzita Lederer    Diverticulitis of colon 02/2015   Ectopic pregnancy    Emphysema of lung ([place])    Frozen shoulder    Heart murmur    Hyperlipidemia    Hyperthyroidism    Infertility, female    Irregular heartbeat    Joint pain    Obesity    Osteoarthritis    Sleep apnea    Swallowing difficulty    Thyroid disease    hyperthyroid    PAST SURGICAL HISTORY: Past Surgical History:  Procedure Laterality Date   APPENDECTOMY     CESAREAN SECTION     COLONOSCOPY  09-10-10   per Dr. Acquanetta Sit, diverticulosis of descending colon and sigmoid, internal hemorrhoids, repeat in 10 yrs   COLOSTOMY N/A 02/20/2015   Procedure: COLOSTOMY;  Surgeon: Jackolyn Confer, MD;  Location: [facility];  Service: General;  Laterality: N/A;   COLOSTOMY REVISION  02/20/2015   Procedure: Sigmoid Colectomy;  Surgeon: Jackolyn Confer, MD;  Location: [facility];  Service: General;;   CYSTOSCOPY N/A 02/20/2015   Procedure: Consuela Mimes;  Surgeon: Kathie Rhodes, MD;  Location: [facility];  Service: Urology;  Laterality: N/A;   DILATION AND CURETTAGE OF UTERUS     x3   ILEO LOOP COLOSTOMY CLOSURE N/A 09/26/2015   Procedure: LAPAROSCOPIC LYSIS OF ADHESIONS, COLOSTOMY CLOSURE;  Surgeon: Jackolyn Confer, MD;  Location: [facility];  Service: General;  Laterality: N/A;   LAPAROTOMY N/A 02/20/2015   Procedure: Emergency EXPLORATORY and drainiage of interabdominal abcesses;  Surgeon: Jackolyn Confer, MD;  Location: [facility];  Service: General;  Laterality: N/A;   SALPINGOOPHORECTOMY Left 02/20/2015   Procedure: SALPINGO OOPHORECTOMY;  Surgeon: Jackolyn Confer, MD;  Location: [facility];  Service: General;  Laterality: Left;   TONSILLECTOMY  1980    SOCIAL HISTORY: Social History   Tobacco Use   Smoking status: Current Every Day Smoker    Packs/day: 0.25    Years: 40.00    Pack years: 10.00     Types: Cigarettes   Smokeless tobacco: Never Used   Tobacco comment: less than 1/2 pack a day  Substance Use Topics   Alcohol use: Yes    Alcohol/week: 0.0 standard drinks    Comment: occ   Drug use: No    FAMILY HISTORY: Family History  Problem Relation Age of Onset  Breast cancer Mother    High blood pressure Mother    Kidney disease Mother    Sleep apnea Mother    Obesity Mother    CAD Father        CABG   Skin cancer Father    Lung cancer Father    Diverticulitis Father    Colon polyps Father    High blood pressure Father    Stroke Father    Heart disease Father    Sleep apnea Father    COPD Other    Cancer Other    Heart disease Other    Stroke Other    Allergies Brother    Breast cancer Maternal Grandmother    Heart attack Maternal Grandfather    Heart attack Paternal Grandfather    Hypertension Neg Hx    ROS: Review of Systems  Gastrointestinal: Negative for nausea and vomiting.  Musculoskeletal:       Negative for muscle weakness.  Endo/Heme/Allergies:       Negative for hypoglycemia. Negative for polyphagia.  Psychiatric/Behavioral: Positive for depression. Negative for suicidal ideas.       Negative for homicidal ideas.   PHYSICAL EXAM: Pt in no acute distress  RECENT LABS AND TESTS: BMET    Component Value Date/Time   NA 142 01/11/2019 1136   K 4.4 01/11/2019 1136   CL 102 01/11/2019 1136   CO2 22 01/11/2019 1136   GLUCOSE 87 01/11/2019 1136   GLUCOSE 89 11/16/2017 1655   BUN 9 01/11/2019 1136   CREATININE 0.68 01/11/2019 1136   CREATININE 0.68 02/26/2016 1654   CALCIUM 9.5 01/11/2019 1136   GFRNONAA 95 01/11/2019 1136   GFRNONAA >89 02/26/2016 1654   GFRAA 109 01/11/2019 1136   GFRAA >89 02/26/2016 1654   Lab Results  Component Value Date   HGBA1C 6.2 (A) 03/23/2019   HGBA1C 5.7 (A) 09/20/2018   HGBA1C 5.5 09/30/2017   HGBA1C 6.3 04/01/2017   HGBA1C 6.2 10/01/2016   Lab Results  Component Value  Date   INSULIN 21.2 01/11/2019   CBC    Component Value Date/Time   WBC 9.5 11/16/2017 1655   RBC 4.69 11/16/2017 1655   HGB 14.6 11/16/2017 1655   HCT 43.4 11/16/2017 1655   PLT 288.0 11/16/2017 1655   MCV 92.5 11/16/2017 1655   MCH 30.1 10/07/2015 0546   MCHC 33.5 11/16/2017 1655   RDW 15.3 11/16/2017 1655   LYMPHSABS 3.1 11/16/2017 1655   MONOABS 0.6 11/16/2017 1655   EOSABS 0.1 11/16/2017 1655   BASOSABS 0.1 11/16/2017 1655   Iron/TIBC/Ferritin/ %Sat No results found for: IRON, TIBC, FERRITIN, IRONPCTSAT Lipid Panel     Component Value Date/Time   CHOL 313 (H) 03/23/2019 1005   TRIG (H) 03/23/2019 1005    488.0 Triglyceride is over 400; calculations on Lipids are invalid.   HDL 24.00 (L) 03/23/2019 1005   CHOLHDL 13 03/23/2019 1005   VLDL 56.8 (H) 05/23/2014 0933   LDLCALC 93 11/02/2011 0947   LDLDIRECT 64.0 03/23/2019 1005   Hepatic Function Panel     Component Value Date/Time   PROT 7.0 01/11/2019 1136   ALBUMIN 4.6 01/11/2019 1136   AST 23 01/11/2019 1136   ALT 25 01/11/2019 1136   ALKPHOS 84 01/11/2019 1136   BILITOT 0.3 01/11/2019 1136   BILIDIR 0.1 11/16/2017 1655   IBILI 0.6 02/25/2015 0928      Component Value Date/Time   TSH 2.26 03/23/2019 1005   TSH 2.44 09/20/2018 1118  TSH 4.06 11/09/2017 1557   Results for TAHIRY, KIN (MRN [id number]) as of 07/31/2019 16:13  Ref. Range 01/11/2019 11:36  Vitamin D, 25-Hydroxy Latest Ref Range: 30.0 - 100.0 ng/mL 27.3 (L)   I, Michaelene Song, am acting as Location manager for [employer], DO  I have reviewed the above documentation for accuracy and completeness, and I agree with the above. -Jearld Lesch, DO

## 2019-08-01 ENCOUNTER — Encounter (INDEPENDENT_AMBULATORY_CARE_PROVIDER_SITE_OTHER): Payer: Self-pay | Admitting: Bariatrics

## 2019-08-03 ENCOUNTER — Telehealth: Payer: BC Managed Care – PPO | Admitting: Nurse Practitioner

## 2019-08-03 DIAGNOSIS — B9689 Other specified bacterial agents as the cause of diseases classified elsewhere: Secondary | ICD-10-CM

## 2019-08-03 MED ORDER — FLUTICASONE PROPIONATE 50 MCG/ACT NA SUSP: 2.0000 | Freq: Every day | NASAL | 0 refills | Status: DC

## 2019-08-03 MED ORDER — AMOXICILLIN-POT CLAVULANATE 875-125 MG PO TABS: 1.0000 | ORAL_TABLET | Freq: Two times a day (BID) | ORAL | 0 refills | Status: AC

## 2019-08-03 NOTE — Progress Notes (Signed)
We are sorry that you are not feeling well.  Here is how we plan to help!  Based on what you have shared with me it looks like you have sinusitis.  Sinusitis is inflammation and infection in the sinus cavities of the head.  Based on your presentation I believe you most likely have Acute Bacterial Sinusitis.  This is an infection caused by bacteria and is treated with antibiotics. I have prescribed Augmentin 875mg /125mg  one tablet twice daily with food, for 7 days. I am also prescribing Flonase, 2 sprays daily for 10 days.  You may use an oral decongestant such as Mucinex D or if you have glaucoma or high blood pressure use plain Mucinex. Saline nasal spray help and can safely be used as often as needed for congestion.  If you develop worsening sinus pain, fever or notice severe headache and vision changes, or if symptoms are not better after completion of antibiotic, please schedule an appointment with a health care provider.  Also, because this problem is reoccurring, you may need follow up with Ear, Nose and Throat physicians.  Sinus infections are not as easily transmitted as other respiratory infection, however we still recommend that you avoid close contact with loved ones, especially the very young and elderly.  Remember to wash your hands thoroughly throughout the day as this is the number one way to prevent the spread of infection!  Home Care:  Only take medications as instructed by your medical team.  Complete the entire course of an antibiotic.  Do not take these medications with alcohol.  A steam or ultrasonic humidifier can help congestion.  You can place a towel over your head and breathe in the steam from hot water coming from a faucet.  Avoid close contacts especially the very young and the elderly.  Cover your mouth when you cough or sneeze.  Always remember to wash your hands.  Get Help Right Away If:  You develop worsening fever or sinus pain.  You develop a severe head  ache or visual changes.  Your symptoms persist after you have completed your treatment plan.  Make sure you  Understand these instructions.  Will watch your condition.  Will get help right away if you are not doing well or get worse.  Your e-visit answers were reviewed by a board certified advanced clinical practitioner to complete your personal care plan.  Depending on the condition, your plan could have included both over the counter or prescription medications.  If there is a problem please reply  once you have received a response from your provider.  Your safety is important to [name].  If you have drug allergies check your prescription carefully.    You can use MyChart to ask questions about today's visit, request a non-urgent call back, or ask for a work or school excuse for 24 hours related to this e-Visit. If it has been greater than 24 hours you will need to follow up with your provider, or enter a new e-Visit to address those concerns.  You will get an e-mail in the next two days asking about your experience.  I hope that your e-visit has been valuable and will speed your recovery. Thank you for using e-visits.   I have spent approximately 5-10 minutes reviewing and documenting in the patient's chart.

## 2019-08-06 NOTE — Progress Notes (Signed)
Virtual Visit via Video Note   This visit type was conducted due to national recommendations for restrictions regarding the COVID-19 Pandemic (e.g. social distancing) in an effort to limit this patient's exposure and mitigate transmission in our community.  Due to her co-morbid illnesses, this patient is at least at moderate risk for complications without adequate follow up.  This format is felt to be most appropriate for this patient at this time.  All issues noted in this document were discussed and addressed.  A limited physical exam was performed with this format.  Please refer to the patient's chart for her consent to telehealth for [facility].   Date:  08/07/2019   ID:  Alison Best, DOB 09-20-1956, MRN [id number]  Patient Location: Home Provider Location: Home  PCP:  Laurey Morale, MD  Cardiologist:  Lauree Chandler, MD   Electrophysiologist:  None   Evaluation Performed:  Follow-Up Visit  Chief Complaint:  F/U  History of Present Illness:    Alison Best is a 63 y.o. female with  history of paroxysmal atrial fibrillation, hyperlipidemia, sleep apnea, hyperthyroidism, carotid artery disease and tobacco abuse.  During her hospitalization in 2016 for perforated diverticulitis, her EKG showed T wave inversions and ST depression. I saw her in the office in July 2016 and arranged an echo and a holter monitor  Echo with normal LV function and TR with no other valve disease. Stress myoview 05/28/15 with no ischemia. When she presented for her echo she was in rapid atrial fib.Found to be hyperthyroid. She was started on Toprol and ASA given CHADS VASC score of 1.Carotid dopplers August 2016 with mild bilateral carotid disease.  She did not tolerate beta blockers due to fatigue. She has had no known recurrence of atrial fibrillation since 2016.   LOV Dr. Angelena Form 07/24/18 No recurrent Afib but if she did would consider anticoagulation because of very mild  carotid disease which would put her at [facility] 2. Needs repeat carotids this year.  Gained weight with Covid but now lost 7 lbs in past 5 weeks with [facility]. 2-3 weeks ago she was having some more heart flutters and wondered if she had Afib. Hasn't had any since. Smoking more in the past few months but now wants to quit and smoking just a few a day.  Denies chest pain, shortness of breath, dizziness or presyncope. Not exercising much because of a torn ACL. Doing strengthening exercises.  The patient does not have symptoms concerning for COVID-19 infection (fever, chills, cough, or new shortness of breath).    Past Medical History:  Diagnosis Date   Anal fissure    Arthritis    Bursitis    Constipation    COPD (chronic obstructive pulmonary disease) (HCC)    Diabetes mellitus without complication ([place])    sees Dr. Cruzita Lederer    Diverticulitis of colon 02/2015   Ectopic pregnancy    Emphysema of lung ([place])    Frozen shoulder    Heart murmur    Hyperlipidemia    Hyperthyroidism    Infertility, female    Irregular heartbeat    Joint pain    Obesity    Osteoarthritis    Sleep apnea    Swallowing difficulty    Thyroid disease    hyperthyroid   Past Surgical History:  Procedure Laterality Date   APPENDECTOMY     CESAREAN SECTION     COLONOSCOPY  09-10-10   per Dr. Acquanetta Sit,  diverticulosis of descending colon and sigmoid, internal hemorrhoids, repeat in 10 yrs   COLOSTOMY N/A 02/20/2015   Procedure: COLOSTOMY;  Surgeon: Jackolyn Confer, MD;  Location: [facility];  Service: General;  Laterality: N/A;   COLOSTOMY REVISION  02/20/2015   Procedure: Sigmoid Colectomy;  Surgeon: Jackolyn Confer, MD;  Location: [facility];  Service: General;;   CYSTOSCOPY N/A 02/20/2015   Procedure: Consuela Mimes;  Surgeon: Kathie Rhodes, MD;  Location: [facility];  Service: Urology;  Laterality: N/A;   DILATION AND CURETTAGE OF UTERUS     x3   ILEO LOOP  COLOSTOMY CLOSURE N/A 09/26/2015   Procedure: LAPAROSCOPIC LYSIS OF ADHESIONS, COLOSTOMY CLOSURE;  Surgeon: Jackolyn Confer, MD;  Location: [facility];  Service: General;  Laterality: N/A;   LAPAROTOMY N/A 02/20/2015   Procedure: Emergency EXPLORATORY and drainiage of interabdominal abcesses;  Surgeon: Jackolyn Confer, MD;  Location: [facility];  Service: General;  Laterality: N/A;   SALPINGOOPHORECTOMY Left 02/20/2015   Procedure: SALPINGO OOPHORECTOMY;  Surgeon: Jackolyn Confer, MD;  Location: [facility];  Service: General;  Laterality: Left;   TONSILLECTOMY  1980     Current Meds  Medication Sig   acetaminophen (TYLENOL) 325 MG tablet Take 2 tablets (650 mg total) by mouth every 6 (six) hours as needed for moderate pain or headache.   albuterol (PROVENTIL HFA;VENTOLIN HFA) 108 (90 Base) MCG/ACT inhaler INHALE 2 PUFFS INTO THE LUNGS EVERY 4 HOURS AS NEEDED FOR WHEEZING   amoxicillin-clavulanate (AUGMENTIN) 875-125 MG tablet Take 1 tablet by mouth 2 (two) times daily for 7 days.   aspirin 81 MG tablet Take 81 mg by mouth daily.   cefUROXime (CEFTIN) 500 MG tablet Take 1 tablet (500 mg total) by mouth 2 (two) times daily with a meal.   Coenzyme Q10 (CO Q 10 PO) Take 1 capsule by mouth daily.   diclofenac (VOLTAREN) 75 MG EC tablet TAKE 1 TABLET(75 MG) BY MOUTH TWICE DAILY   diltiazem (CARDIZEM CD) 120 MG 24 hr capsule TAKE 1 CAPSULE(120 MG) BY MOUTH DAILY   fluticasone (FLONASE) 50 MCG/ACT nasal spray Place 2 sprays into both nostrils daily.   fluticasone (FLONASE) 50 MCG/ACT nasal spray Place 2 sprays into both nostrils daily for 10 days.   furosemide (LASIX) 20 MG tablet TAKE 1 TABLET BY MOUTH DAILY AS NEEDED FOR FLUID RETENTION   glucose blood (CONTOUR NEXT TEST) test strip Use to check blood sugar once a day.   metFORMIN (GLUCOPHAGE) 500 MG tablet TAKE 1 TABLET(500 MG) BY MOUTH TWICE DAILY WITH A MEAL   methimazole (TAPAZOLE) 5 MG tablet TAKE 1/2 TABLET( 2.5 MG) BY MOUTH DAILY    metroNIDAZOLE (METROGEL) 1 % gel APPLY EXTERNALLY TO THE AFFECTED AREA DAILY   Microlet Lancets MISC USE TO CHECK BLOOD SUGAR ONCE DAILY   Multiple Vitamins-Minerals (MULTIVITAMIN WITH MINERALS) tablet Take 1 tablet by mouth daily.   multivitamin-lutein (OCUVITE-LUTEIN) CAPS capsule Take 1 capsule by mouth daily.   Omega-3 Fatty Acids (FISH OIL PO) Take 1 capsule by mouth daily.   polyethylene glycol powder (GLYCOLAX/MIRALAX) 17 GM/SCOOP powder Take 17 g by mouth daily.   topiramate (TOPAMAX) 25 MG tablet Take 1 tablet (25 mg total) by mouth 2 (two) times daily.   TURMERIC PO Take 1 tablet by mouth daily.   Vitamin D, Ergocalciferol, (DRISDOL) 1.25 MG (50000 UT) CAPS capsule Take 1 capsule (50,000 Units total) by mouth every 7 (seven) days.     Allergies:   Levofloxacin in d5w, Mangifera indica, Mango flavor, Bupropion, and Levaquin [  levofloxacin]   Social History   Tobacco Use   Smoking status: Current Every Day Smoker    Packs/day: 0.25    Years: 40.00    Pack years: 10.00    Types: Cigarettes   Smokeless tobacco: Never Used   Tobacco comment: less than 1/2 pack a day  Substance Use Topics   Alcohol use: Yes    Alcohol/week: 0.0 standard drinks    Comment: occ   Drug use: No     Family Hx: The patient's family history includes Allergies in her brother; Breast cancer in her maternal grandmother and mother; CAD in her father; COPD in an other family member; Cancer in an other family member; Colon polyps in her father; Diverticulitis in her father; Heart attack in her maternal grandfather and paternal grandfather; Heart disease in her father and another family member; High blood pressure in her father and mother; Kidney disease in her mother; Lung cancer in her father; Obesity in her mother; Skin cancer in her father; Sleep apnea in her father and mother; Stroke in her father and another family member. There is no history of Hypertension.  ROS:   Please see the history  of present illness.      All other systems reviewed and are negative.   Prior CV studies:   The following studies were reviewed today:   Echo 06/03/15: Left ventricle: The cavity size was normal. Wall thickness was   normal. Systolic function was normal. The estimated ejection   fraction was in the range of 60% to 65%. Wall motion was normal;   there were no regional wall motion abnormalities. - Left atrium: The atrium was moderately dilated. - Tricuspid valve: There was mild-moderate regurgitation. - Pulmonary arteries: Systolic pressure was moderately increased.   PA peak pressure: 51 mm Hg (S).   Carotids 2016 1-39% bilateral ICA   NST 2016Nuclear stress EF: 78%.  There was no ST segment deviation noted during stress.  The study is normal.  This is a low risk study.  The left ventricular ejection fraction is hyperdynamic (>65%).  Decreased exercise capacity.  A run of nsVT - 4 beats during stress.   Low risk study with no infarct or ischemia.     Labs/Other Tests and Data Reviewed:    EKG:  An ECG dated 01/11/19 was personally reviewed today and demonstrated:  NSR low voltage, no acute change  Recent Labs: 01/11/2019: ALT 25; BUN 9; Creatinine, Ser 0.68; Potassium 4.4; Sodium 142 03/23/2019: TSH 2.26   Recent Lipid Panel Lab Results  Component Value Date/Time   CHOL 313 (H) 03/23/2019 10:05 AM   TRIG (H) 03/23/2019 10:05 AM    488.0 Triglyceride is over 400; calculations on Lipids are invalid.   HDL 24.00 (L) 03/23/2019 10:05 AM   CHOLHDL 13 03/23/2019 10:05 AM   LDLCALC 93 11/02/2011 09:47 AM   LDLDIRECT 64.0 03/23/2019 10:05 AM   Wt Readings from Last 3 Encounters:  08/07/19 223 lb (101.2 kg)  03/23/19 231 lb (104.8 kg)  01/11/19 227 lb (103 kg)   PE: BP 143/86 P 70  General: no acute distress Neck without JVD Ext without edema   Assessment/Plan  PAF in setting of hyperthyroidism 2016. CHADSVASC=1 but would be 2 if carotid disease was counted.  Will repeat dopplers. Had some fluttering 2 weeks ago but none since. Asked her to call us if this reoccurs and we'll place a monitor  Mild carotid disease 2016-order dopplers  Tobacco abuse smoking cessation discussed  HLD cholesterol 313, triglycerides over 400 02/2019 on fish oil. Start lipitor 10 mg once daily, Vascepa 2 gm bid  OSA not using CPAP-advised to be reevaluated-refer to Dr. Horris Latino to weight loss center now. Recommend 150 min exercise weekly.    Time:   Today, I have spent 24:10 minutes with the patient with telehealth technology discussing the above problems.     Medication Adjustments/Labs and Tests Ordered: Current medicines are reviewed at length with the patient today.  Concerns regarding medicines are outlined above.   Tests Ordered: Orders Placed This Encounter  Procedures   Hepatic function panel   Lipid panel   Split night study   VAS US CAROTID    Medication Changes: Meds ordered this encounter  Medications   atorvastatin (LIPITOR) 20 MG tablet    Sig: Take 1 tablet (20 mg total) by mouth daily.    Dispense:  90 tablet    Refill:  3   Icosapent Ethyl (VASCEPA) 1 g CAPS    Sig: Take 2 capsules (2 g total) by mouth 2 (two) times daily.    Dispense:  360 capsule    Refill:  3    BIN# [id number] PCN# [id number] GRP# [id number] ID# [id number]    Follow Up:  Either In Person or Virtual Visit in 1 year(s) Dr. Angelena Form  Signed, Ermalinda Barrios, PA-C  08/07/2019 9:23 AM    [place]

## 2019-08-07 ENCOUNTER — Other Ambulatory Visit: Payer: Self-pay

## 2019-08-07 ENCOUNTER — Encounter: Payer: Self-pay | Admitting: Physician Assistant

## 2019-08-07 ENCOUNTER — Telehealth: Payer: Self-pay | Admitting: *Deleted

## 2019-08-07 ENCOUNTER — Telehealth: Payer: Self-pay

## 2019-08-07 ENCOUNTER — Telehealth (INDEPENDENT_AMBULATORY_CARE_PROVIDER_SITE_OTHER): Payer: BC Managed Care – PPO | Admitting: Physician Assistant

## 2019-08-07 VITALS — Wt 223.0 lb

## 2019-08-07 DIAGNOSIS — Z720 Tobacco use: Secondary | ICD-10-CM

## 2019-08-07 DIAGNOSIS — I6523 Occlusion and stenosis of bilateral carotid arteries: Secondary | ICD-10-CM

## 2019-08-07 DIAGNOSIS — G4733 Obstructive sleep apnea (adult) (pediatric): Secondary | ICD-10-CM

## 2019-08-07 DIAGNOSIS — E782 Mixed hyperlipidemia: Secondary | ICD-10-CM

## 2019-08-07 DIAGNOSIS — I480 Paroxysmal atrial fibrillation: Secondary | ICD-10-CM | POA: Diagnosis not present

## 2019-08-07 MED ORDER — ATORVASTATIN CALCIUM 20 MG PO TABS: 20.0000 mg | ORAL_TABLET | Freq: Every day | ORAL | 3 refills | Status: DC

## 2019-08-07 MED ORDER — VASCEPA 1 G PO CAPS: 2.0000 | ORAL_CAPSULE | Freq: Two times a day (BID) | ORAL | 3 refills | Status: DC

## 2019-08-07 NOTE — Patient Instructions (Signed)
Medication Instructions:  Your physician has recommended you make the following change in your medication:   1. START: atorvastatin (lipitor) 20 mg tablet: Take 1 tablet by mouth once a day  2. START: vascepa 1 g capsule: Take 2 capsules by mouth twice a day  Lab work: Your physician recommends that you return for a FASTING lipid profile and liver function panel in 3 months  If you have labs (blood work) drawn today and your tests are completely normal, you will receive your results only by: Marland Kitchen MyChart Message (if you have MyChart) OR . A paper copy in the mail If you have any lab test that is abnormal or we need to change your treatment, we will call you to review the results.  Testing/Procedures: Your physician has requested that you have a carotid duplex. This test is an ultrasound of the carotid arteries in your neck. It looks at blood flow through these arteries that supply the brain with blood. Allow one hour for this exam. There are no restrictions or special instructions.  Your physician has recommended that you have a sleep study. This test records several body functions during sleep, including: brain activity, eye movement, oxygen and carbon dioxide blood levels, heart rate and rhythm, breathing rate and rhythm, the flow of air through your mouth and nose, snoring, body muscle movements, and chest and belly movement.  Follow-Up: At [facility], you and your health needs are our priority.  As part of our continuing mission to provide you with exceptional heart care, we have created designated Provider Care Teams.  These Care Teams include your primary Cardiologist (physician) and Advanced Practice Providers (APPs -  Physician Assistants and Nurse Practitioners) who all work together to provide you with the care you need, when you need it. . You will need a follow up appointment in 1 year.  Please call our office 2 months in advance to schedule this appointment.  You may see Darlina Guys, MD or one of the following Advanced Practice Providers on your designated Care Team:   . Lyda Jester, PA-C . Dayna Dunn, PA-C . Ermalinda Barrios, PA-C  Any Other Special Instructions Will Be Listed Below (If Applicable).  Your provider recommends that you maintain 150 minutes per week of moderate aerobic activity.   Steps to Quit Smoking  Smoking tobacco is the leading cause of preventable death. It can affect almost every organ in the body. Smoking puts you and people around you at risk for many serious, long-lasting (chronic) diseases. Quitting smoking can be hard, but it is one of the best things that you can do for your health. It is never too late to quit. How do I get ready to quit? When you decide to quit smoking, make a plan to help you succeed. Before you quit:  Pick a date to quit. Set a date within the next 2 weeks to give you time to prepare.  Write down the reasons why you are quitting. Keep this list in places where you will see it often.  Tell your family, friends, and co-workers that you are quitting. Their support is important.  Talk with your doctor about the choices that may help you quit.  Find out if your health insurance will pay for these treatments.  Know the people, places, things, and activities that make you want to smoke (triggers). Avoid them. What first steps can I take to quit smoking?  Throw away all cigarettes at home, at work, and in your  car.  Throw away the things that you use when you smoke, such as ashtrays and lighters.  Clean your car. Make sure to empty the ashtray.  Clean your home, including curtains and carpets. What can I do to help me quit smoking? Talk with your doctor about taking medicines and seeing a counselor at the same time. You are more likely to succeed when you do both.  If you are pregnant or breastfeeding, talk with your doctor about counseling or other ways to quit smoking. Do not take medicine to help you  quit smoking unless your doctor tells you to do so. To quit smoking: Quit right away  Quit smoking totally, instead of slowly cutting back on how much you smoke over a period of time.  Go to counseling. You are more likely to quit if you go to counseling sessions regularly. Take medicine You may take medicines to help you quit. Some medicines need a prescription, and some you can buy over-the-counter. Some medicines may contain a drug called nicotine to replace the nicotine in cigarettes. Medicines may:  Help you to stop having the desire to smoke (cravings).  Help to stop the problems that come when you stop smoking (withdrawal symptoms). Your doctor may ask you to use:  Nicotine patches, gum, or lozenges.  Nicotine inhalers or sprays.  Non-nicotine medicine that is taken by mouth. Find resources Find resources and other ways to help you quit smoking and remain smoke-free after you quit. These resources are most helpful when you use them often. They include:  Online chats with a Social worker.  Phone quitlines.  Printed Furniture conservator/restorer.  Support groups or group counseling.  Text messaging programs.  Mobile phone apps. Use apps on your mobile phone or tablet that can help you stick to your quit plan. There are many free apps for mobile phones and tablets as well as websites. Examples include Quit Guide from the [employer] and smokefree.gov  What things can I do to make it easier to quit?   Talk to your family and friends. Ask them to support and encourage you.  Call a phone quitline (1-800-QUIT-NOW), reach out to support groups, or work with a Social worker.  Ask people who smoke to not smoke around you.  Avoid places that make you want to smoke, such as: ? Bars. ? Parties. ? Smoke-break areas at work.  Spend time with people who do not smoke.  Lower the stress in your life. Stress can make you want to smoke. Try these things to help your stress: ? Getting regular exercise.  ? Doing deep-breathing exercises. ? Doing yoga. ? Meditating. ? Doing a body scan. To do this, close your eyes, focus on one area of your body at a time from head to toe. Notice which parts of your body are tense. Try to relax the muscles in those areas. How will I feel when I quit smoking? Day 1 to 3 weeks Within the first 24 hours, you may start to have some problems that come from quitting tobacco. These problems are very bad 2-3 days after you quit, but they do not often last for more than 2-3 weeks. You may get these symptoms:  Mood swings.  Feeling restless, nervous, angry, or annoyed.  Trouble concentrating.  Dizziness.  Strong desire for high-sugar foods and nicotine.  Weight gain.  Trouble pooping (constipation).  Feeling like you may vomit (nausea).  Coughing or a sore throat.  Changes in how the medicines that you take for  other issues work in Veterinary surgeon.  Depression.  Trouble sleeping (insomnia). Week 3 and afterward After the first 2-3 weeks of quitting, you may start to notice more positive results, such as:  Better sense of smell and taste.  Less coughing and sore throat.  Slower heart rate.  Lower blood pressure.  Clearer skin.  Better breathing.  Fewer sick days. Quitting smoking can be hard. Do not give up if you fail the first time. Some people need to try a few times before they succeed. Do your best to stick to your quit plan, and talk with your doctor if you have any questions or concerns. Summary  Smoking tobacco is the leading cause of preventable death. Quitting smoking can be hard, but it is one of the best things that you can do for your health.  When you decide to quit smoking, make a plan to help you succeed.  Quit smoking right away, not slowly over a period of time.  When you start quitting, seek help from your doctor, family, or friends. This information is not intended to replace advice given to you by your health care  provider. Make sure you discuss any questions you have with your health care provider. Document Released: 08/07/2009 Document Revised: 12/29/2018 Document Reviewed: 12/30/2018 Elsevier Patient Education  [place].  Two Gram Sodium Diet 2000 mg  What is Sodium? Sodium is a mineral found naturally in many foods. The most significant source of sodium in the diet is table salt, which is about 40% sodium.  Processed, convenience, and preserved foods also contain a large amount of sodium.  The body needs only 500 mg of sodium daily to function,  A normal diet provides more than enough sodium even if you do not use salt.  Why Limit Sodium? A build up of sodium in the body can cause thirst, increased blood pressure, shortness of breath, and water retention.  Decreasing sodium in the diet can reduce edema and risk of heart attack or stroke associated with high blood pressure.  Keep in mind that there are many other factors involved in these health problems.  Heredity, obesity, lack of exercise, cigarette smoking, stress and what you eat all play a role.  General Guidelines:  Do not add salt at the table or in cooking.  One teaspoon of salt contains over 2 grams of sodium.  Read food labels  Avoid processed and convenience foods  Ask your dietitian before eating any foods not dicussed in the menu planning guidelines  Consult your physician if you wish to use a salt substitute or a sodium containing medication such as antacids.  Limit milk and milk products to 16 oz (2 cups) per day.  Shopping Hints:  READ LABELS!! "Dietetic" does not necessarily mean low sodium.  Salt and other sodium ingredients are often added to foods during processing.   Menu Planning Guidelines Food Group Choose More Often Avoid  Beverages (see also the milk group All fruit juices, low-sodium, salt-free vegetables juices, low-sodium carbonated beverages Regular vegetable or tomato juices, commercially softened  water used for drinking or cooking  Breads and Cereals Enriched white, wheat, rye and pumpernickel bread, hard rolls and dinner rolls; muffins, cornbread and waffles; most dry cereals, cooked cereal without added salt; unsalted crackers and breadsticks; low sodium or homemade bread crumbs Bread, rolls and crackers with salted tops; quick breads; instant hot cereals; pancakes; commercial bread stuffing; self-rising flower and biscuit mixes; regular bread crumbs or cracker crumbs  Desserts  and Sweets Desserts and sweets mad with mild should be within allowance Instant pudding mixes and cake mixes  Fats Butter or margarine; vegetable oils; unsalted salad dressings, regular salad dressings limited to 1 Tbs; light, sour and heavy cream Regular salad dressings containing bacon fat, bacon bits, and salt pork; snack dips made with instant soup mixes or processed cheese; salted nuts  Fruits Most fresh, frozen and canned fruits Fruits processed with salt or sodium-containing ingredient (some dried fruits are processed with sodium sulfites        Vegetables Fresh, frozen vegetables and low- sodium canned vegetables Regular canned vegetables, sauerkraut, pickled vegetables, and others prepared in brine; frozen vegetables in sauces; vegetables seasoned with ham, bacon or salt pork  Condiments, Sauces, Miscellaneous  Salt substitute with physician's approval; pepper, herbs, spices; vinegar, lemon or lime juice; hot pepper sauce; garlic powder, onion powder, low sodium soy sauce (1 Tbs.); low sodium condiments (ketchup, chili sauce, mustard) in limited amounts (1 tsp.) fresh ground horseradish; unsalted tortilla chips, pretzels, potato chips, popcorn, salsa (1/4 cup) Any seasoning made with salt including garlic salt, celery salt, onion salt, and seasoned salt; sea salt, rock salt, kosher salt; meat tenderizers; monosodium glutamate; mustard, regular soy sauce, barbecue, sauce, chili sauce, teriyaki sauce, steak  sauce, Worcestershire sauce, and most flavored vinegars; canned gravy and mixes; regular condiments; salted snack foods, olives, picles, relish, horseradish sauce, catsup   Food preparation: Try these seasonings Meats:    Pork Sage, onion Serve with applesauce  Chicken Poultry seasoning, thyme, parsley Serve with cranberry sauce  Lamb Curry powder, rosemary, garlic, thyme Serve with mint sauce or jelly  Veal Marjoram, basil Serve with current jelly, cranberry sauce  Beef Pepper, bay leaf Serve with dry mustard, unsalted chive butter  Fish [place] leaf, dill Serve with unsalted lemon butter, unsalted parsley butter  Vegetables:    Asparagus Lemon juice   Broccoli Lemon juice   Carrots Mustard dressing parsley, mint, nutmeg, glazed with unsalted butter and sugar   Green beans Marjoram, lemon juice, nutmeg,dill seed   Tomatoes Basil, marjoram, onion   Spice /blend for [employer]" 4 tsp ground thyme 1 tsp ground sage 3 tsp ground rosemary 4 tsp ground marjoram   Test your knowledge 1. A product that says "Salt Free" may still contain sodium. True or False 2. Garlic Powder and Hot Pepper Sauce an be used as alternative seasonings.True or False 3. Processed foods have more sodium than fresh foods.  True or False 4. Canned Vegetables have less sodium than froze True or False  WAYS TO DECREASE YOUR SODIUM INTAKE 1. Avoid the use of added salt in cooking and at the table.  Table salt (and other prepared seasonings which contain salt) is probably one of the greatest sources of sodium in the diet.  Unsalted foods can gain flavor from the sweet, sour, and butter taste sensations of herbs and spices.  Instead of using salt for seasoning, try the following seasonings with the foods listed.  Remember: how you use them to enhance natural food flavors is limited only by your creativity... Allspice-Meat, fish, eggs, fruit, peas, red and yellow vegetables Almond Extract-Fruit baked goods Anise Seed-Sweet  breads, fruit, carrots, beets, cottage cheese, cookies (tastes like licorice) Basil-Meat, fish, eggs, vegetables, rice, vegetables salads, soups, sauces Bay Leaf-Meat, fish, stews, poultry Burnet-Salad, vegetables (cucumber-like flavor) Caraway Seed-Bread, cookies, cottage cheese, meat, vegetables, cheese, rice Cardamon-Baked goods, fruit, soups Celery Powder or seed-Salads, salad dressings, sauces, meatloaf, soup, bread.Do not use  celery salt Chervil-Meats, salads, fish, eggs, vegetables, cottage cheese (parsley-like flavor) Chili Power-Meatloaf, chicken cheese, corn, eggplant, egg dishes Chives-Salads cottage cheese, egg dishes, soups, vegetables, sauces Cilantro-Salsa, casseroles Cinnamon-Baked goods, fruit, pork, lamb, chicken, carrots Cloves-Fruit, baked goods, fish, pot roast, green beans, beets, carrots Coriander-Pastry, cookies, meat, salads, cheese (lemon-orange flavor) Cumin-Meatloaf, fish,cheese, eggs, cabbage,fruit pie (caraway flavor) [employer], fruit, eggs, fish, poultry, cottage cheese, vegetables Dill Seed-Meat, cottage cheese, poultry, vegetables, fish, salads, bread Fennel Seed-Bread, cookies, apples, pork, eggs, fish, beets, cabbage, cheese, Licorice-like flavor Garlic-(buds or powder) Salads, meat, poultry, fish, bread, butter, vegetables, potatoes.Do not  use garlic salt Ginger-Fruit, vegetables, baked goods, meat, fish, poultry Horseradish Root-Meet, vegetables, butter Lemon Juice or Extract-Vegetables, fruit, tea, baked goods, fish salads Mace-Baked goods fruit, vegetables, fish, poultry (taste like nutmeg) Maple Extract-Syrups Marjoram-Meat, chicken, fish, vegetables, breads, green salads (taste like Sage) Mint-Tea, lamb, sherbet, vegetables, desserts, carrots, cabbage Mustard, Dry or Seed-Cheese, eggs, meats, vegetables, poultry Nutmeg-Baked goods, fruit, chicken, eggs, vegetables, desserts Onion Powder-Meat, fish, poultry, vegetables, cheese, eggs,  bread, rice salads (Do not use   Onion salt) Orange Extract-Desserts, baked goods Oregano-Pasta, eggs, cheese, onions, pork, lamb, fish, chicken, vegetables, green salads Paprika-Meat, fish, poultry, eggs, cheese, vegetables Parsley Flakes-Butter, vegetables, meat fish, poultry, eggs, bread, salads (certain forms may   Contain sodium Pepper-Meat fish, poultry, vegetables, eggs Peppermint Extract-Desserts, baked goods Poppy Seed-Eggs, bread, cheese, fruit dressings, baked goods, noodles, vegetables, cottage  [employer], poultry, meat, fish, cauliflower, turnips,eggs bread Saffron-Rice, bread, veal, chicken, fish, eggs Sage-Meat, fish, poultry, onions, eggplant, tomateos, pork, stews Savory-Eggs, salads, poultry, meat, rice, vegetables, soups, pork Tarragon-Meat, poultry, fish, eggs, butter, vegetables (licorice-like flavor)  Thyme-Meat, poultry, fish, eggs, vegetables, (clover-like flavor), sauces, soups Tumeric-Salads, butter, eggs, fish, rice, vegetables (saffron-like flavor) Vanilla Extract-Baked goods, candy Vinegar-Salads, vegetables, meat marinades Walnut Extract-baked goods, candy  2. Choose your Foods Wisely   The following is a list of foods to avoid which are high in sodium:  Meats-Avoid all smoked, canned, salt cured, dried and kosher meat and fish as well as Anchovies   [employer] meats:Bologna, Liverwurst, Pastrami Canned meat or fish  Marinated herring Caviar    Pepperoni Corned Beef   Pizza Dried chipped beef  Salami Frozen breaded fish or meat Salt pork Frankfurters or hot dogs  Sardines Gefilte fish   Sausage Ham (boiled ham, Proscuitto Smoked butt    spiced ham)   Spam      TV Dinners Vegetables Canned vegetables (Regular) Relish Canned mushrooms  Sauerkraut Olives    Tomato juice Pickles  Bakery and Dessert Products Canned puddings  Cream pies Cheesecake   Decorated cakes Cookies  Beverages/Juices  Tomato juice, regular  Gatorade   V-8 vegetable juice, regular  Breads and Cereals Biscuit mixes   Salted potato chips, corn chips, pretzels Bread stuffing mixes  Salted crackers and rolls Pancake and waffle mixes Self-rising flour  Seasonings Accent    Meat sauces Barbecue sauce  Meat tenderizer Catsup    Monosodium glutamate (MSG) Celery salt   Onion salt Chili sauce   Prepared mustard Garlic salt   Salt, seasoned salt, sea salt Gravy mixes   Soy sauce Horseradish   Steak sauce Ketchup   Tartar sauce Lite salt    Teriyaki sauce Marinade mixes   Worcestershire sauce  Others Baking powder   Cocoa and cocoa mixes Baking soda   Commercial casserole mixes Candy-caramels, chocolate  Dehydrated soups    Bars, fudge,nougats  Instant  rice and pasta mixes Canned broth or soup  Maraschino cherries Cheese, aged and processed cheese and cheese spreads  Learning Assessment Quiz  Indicated T (for True) or F (for False) for each of the following statements:  1. _____ Fresh fruits and vegetables and unprocessed grains are generally low in sodium 2. _____ Water may contain a considerable amount of sodium, depending on the source 3. _____ You can always tell if a food is high in sodium by tasting it 4. _____ Certain laxatives my be high in sodium and should be avoided unless prescribed   by a physician or pharmacist 5. _____ Salt substitutes may be used freely by anyone on a sodium restricted diet 6. _____ Sodium is present in table salt, food additives and as a natural component of   most foods 7. _____ Table salt is approximately 90% sodium 8. _____ Limiting sodium intake may help prevent excess fluid accumulation in the body 9. _____ On a sodium-restricted diet, seasonings such as bouillon soy sauce, and    cooking wine should be used in place of table salt 10. _____ On an ingredient list, a product which lists monosodium glutamate as the first   ingredient is an appropriate food to  include on a low sodium diet  Circle the best answer(s) to the following statements (Hint: there may be more than one correct answer)  11. On a low-sodium diet, some acceptable snack items are:    A. Olives  F. Bean dip   K. Grapefruit juice    B. Salted Pretzels G. Commercial Popcorn   L. Canned peaches    C. Carrot Sticks  H. Bouillon   M. Unsalted nuts   D. [place] fries  I. Peanut butter crackers N. Salami   E. Sweet pickles J. Tomato Juice   O. Pizza  12.  Seasonings that may be used freely on a reduced - sodium diet include   A. Lemon wedges F.Monosodium glutamate K. Celery seed    B.Soysauce   G. Pepper   L. Mustard powder   C. Sea salt  H. Cooking wine  M. Onion flakes   D. Vinegar  E. Prepared horseradish N. Salsa   E. Sage   J. Worcestershire sauce  O. Chutney

## 2019-08-07 NOTE — Telephone Encounter (Signed)
-----   Message from Cleon Gustin, RN sent at 08/07/2019  9:40 AM EDT ----- Regarding: Split Night Per Ermalinda Barrios, PA patient needs sleep study for OSA. Please arrange. Patient will need to see Dr. Radford Pax after. Thanks

## 2019-08-07 NOTE — Telephone Encounter (Signed)
**Note De-Identified [name] Obfuscation** I started a Vascepa PA through covermymeds. Key: [id number]  Following message received from covermymeds: Alison Best (Key: [id number])  Rx #[id number]  Vascepa 1GM capsules  Form [employer] [place] [employer] Electronic Request Form (CB)  Determination  Favorable  Prior authorization for Vascepa has been approved.   Message from plan: Effective from 08/07/2019 through 08/05/2020.   I have notified [name] of this approval.

## 2019-08-09 ENCOUNTER — Telehealth: Payer: Self-pay | Admitting: *Deleted

## 2019-08-09 NOTE — Telephone Encounter (Signed)
PA for HST submitted to [employer] via web portal. In lab study not approved.

## 2019-08-10 ENCOUNTER — Encounter: Payer: Self-pay | Admitting: Internal Medicine

## 2019-08-11 ENCOUNTER — Ambulatory Visit (INDEPENDENT_AMBULATORY_CARE_PROVIDER_SITE_OTHER): Payer: BC Managed Care – PPO | Admitting: Professional

## 2019-08-11 DIAGNOSIS — F4321 Adjustment disorder with depressed mood: Secondary | ICD-10-CM | POA: Diagnosis not present

## 2019-08-14 ENCOUNTER — Encounter (INDEPENDENT_AMBULATORY_CARE_PROVIDER_SITE_OTHER): Payer: Self-pay | Admitting: Bariatrics

## 2019-08-14 ENCOUNTER — Other Ambulatory Visit: Payer: Self-pay

## 2019-08-14 ENCOUNTER — Ambulatory Visit (INDEPENDENT_AMBULATORY_CARE_PROVIDER_SITE_OTHER): Payer: BC Managed Care – PPO | Admitting: Bariatrics

## 2019-08-14 DIAGNOSIS — E781 Pure hyperglyceridemia: Secondary | ICD-10-CM | POA: Diagnosis not present

## 2019-08-14 DIAGNOSIS — R7303 Prediabetes: Secondary | ICD-10-CM | POA: Diagnosis not present

## 2019-08-14 DIAGNOSIS — F3289 Other specified depressive episodes: Secondary | ICD-10-CM

## 2019-08-14 DIAGNOSIS — Z6841 Body Mass Index (BMI) 40.0 and over, adult: Secondary | ICD-10-CM

## 2019-08-14 MED ORDER — TOPIRAMATE 25 MG PO TABS: 25.0000 mg | ORAL_TABLET | Freq: Two times a day (BID) | ORAL | 0 refills | Status: DC

## 2019-08-15 ENCOUNTER — Encounter (INDEPENDENT_AMBULATORY_CARE_PROVIDER_SITE_OTHER): Payer: Self-pay | Admitting: Bariatrics

## 2019-08-15 NOTE — Progress Notes (Signed)
Office: [phone]  /  Fax: [phone] TeleHealth Visit:  Alison Best has verbally consented to this TeleHealth visit today. The patient is located at home, the provider is located at the [employer] [facility] office. The participants in this visit include the listed provider and patient. The visit was conducted today via Webex.  HPI:   Chief Complaint: OBESITY Alison Best is here to discuss her progress with her obesity treatment plan. She is on the Category 2 plan and is following her eating plan approximately 60% of the time. She states she has PT 10 minutes 5 times per week. Alison Best states that her weight remains the same (weight 223). She had visitors which interfered with staying on the diet. We were unable to weigh the patient today for this TeleHealth visit. She feels as if she has maintained her weight since her last visit. She has lost 0 lbs since starting treatment with [name].  Depression with emotional eating behaviors Alison Best is struggling with emotional eating and using food for comfort to the extent that it is negatively impacting her health. She often snacks when she is not hungry. Alison Best sometimes feels she is out of control and then feels guilty that she made poor food choices. She has been working on behavior modification techniques to help reduce her emotional eating and has been somewhat successful. Alison Best is taking Topamax and shows no sign of suicidal or homicidal ideations.  Depression screen Alison Best 2/9 02/21/2019 01/11/2019 07/26/2017 02/05/2016 06/06/2014  Decreased Interest 0 2 0 0 0  Down, Depressed, Hopeless 1 1 0 0 0  PHQ - 2 Score 1 3 0 0 0  Altered sleeping 0 2 - - -  Tired, decreased energy 1 2 - - -  Change in appetite 1 1 - - -  Feeling bad or failure about yourself  0 1 - - -  Trouble concentrating 0 0 - - -  Moving slowly or fidgety/restless 0 2 - - -  Suicidal thoughts 0 0 - - -  PHQ-9 Score 3 11 - - -  Difficult doing work/chores - Somewhat difficult - -  -  Some recent data might be hidden   Pre-Diabetes Alison Best has a diagnosis of prediabetes based on her elevated Hgb A1c and was informed this puts her at greater risk of developing diabetes. Last A1c 6.2 on 03/23/2019 with an insulin of 21.2 on 01/11/2019. She continues to work on diet and exercise to decrease risk of diabetes. She denies nausea or hypoglycemia. No polyphagia.  Hypertriglyceridemia Alison Best has a diagnosis of hypertriglyceridemia. She has started Vascepa and a statin.  ASSESSMENT AND PLAN:  Prediabetes  Hypertriglyceridemia  Other depression - With emotional eating  - Plan: topiramate (TOPAMAX) 25 MG tablet  Class 3 severe obesity with serious comorbidity and body mass index (BMI) of 40.0 to 44.9 in adult, unspecified obesity type (Alison Best)  PLAN:  Depression with Emotional Eating Behaviors We discussed behavior modification techniques today to help Alison Best deal with her emotional eating and depression. Alison Best was given a prescription for Topamax 25 mg 1 BID #60 with 0 refills. She agrees to follow-up with our clinic in 2-3 weeks.  Pre-Diabetes Alison Best will continue to work on weight loss, exercise, and decreasing simple carbohydrates in her diet to help decrease the risk of diabetes. We dicussed metformin including benefits and risks. She was informed that eating too many simple carbohydrates or too many calories at one sitting increases the likelihood of GI side effects. Alison Best was instructed  to decrease carbohydrates and increase protein. She will follow-up with [name] as directed to monitor her progress.  Hypertriglyceridemia Alison Best will continue medications and will follow-up with her cardiologist.  Obesity Alison Best is currently in the action stage of change. As such, her goal is to continue with weight loss efforts. She has agreed to follow the Category 2 plan. Alison Best will work on meal planning, intentional eating, and will take Citrucel and Metamucil with water. Alison Best has been instructed to  continue her current exercise regimen for weight loss and overall health benefits. We discussed the following Behavioral Modification Strategies today: increasing lean protein intake, decreasing simple carbohydrates, increasing vegetables, increase H20 intake, decrease eating out, no skipping meals, work on meal planning and easy cooking plans, keeping healthy foods in the home, and planning for success.  Alison Best has agreed to follow-up with our clinic in 2-3 weeks. She was informed of the importance of frequent follow-up visits to maximize her success with intensive lifestyle modifications for her multiple health conditions.  ALLERGIES: Allergies  Allergen Reactions  . Levofloxacin In D5w Other (See Comments)    Achilles tendon weakness  . Mangifera Indica Anaphylaxis  . Mango Flavor Swelling  . Bupropion Nausea And Vomiting  . Levaquin [Levofloxacin] Other (See Comments)    Severe tendonitis Tolerates Cipro    MEDICATIONS: Current Outpatient Medications on File Prior to Visit  Medication Sig Dispense Refill  . acetaminophen (TYLENOL) 325 MG tablet Take 2 tablets (650 mg total) by mouth every 6 (six) hours as needed for moderate pain or headache.    . albuterol (PROVENTIL HFA;VENTOLIN HFA) 108 (90 Base) MCG/ACT inhaler INHALE 2 PUFFS INTO THE LUNGS EVERY 4 HOURS AS NEEDED FOR WHEEZING 8.5 g 6  . aspirin 81 MG tablet Take 81 mg by mouth daily.    Marland Kitchen atorvastatin (LIPITOR) 20 MG tablet Take 1 tablet (20 mg total) by mouth daily. 90 tablet 3  . cefUROXime (CEFTIN) 500 MG tablet Take 1 tablet (500 mg total) by mouth 2 (two) times daily with a meal. 20 tablet 0  . Coenzyme Q10 (CO Q 10 PO) Take 1 capsule by mouth daily.    . diclofenac (VOLTAREN) 75 MG EC tablet TAKE 1 TABLET(75 MG) BY MOUTH TWICE DAILY 60 tablet 11  . diltiazem (CARDIZEM CD) 120 MG 24 hr capsule TAKE 1 CAPSULE(120 MG) BY MOUTH DAILY 90 capsule 3  . fluticasone (FLONASE) 50 MCG/ACT nasal spray Place 2 sprays into both nostrils  daily. 16 g 6  . furosemide (LASIX) 20 MG tablet TAKE 1 TABLET BY MOUTH DAILY AS NEEDED FOR FLUID RETENTION 30 tablet 2  . glucose blood (CONTOUR NEXT TEST) test strip Use to check blood sugar once a day. 100 each 12  . Icosapent Ethyl (VASCEPA) 1 g CAPS Take 2 capsules (2 g total) by mouth 2 (two) times daily. 360 capsule 3  . metFORMIN (GLUCOPHAGE) 500 MG tablet TAKE 1 TABLET(500 MG) BY MOUTH TWICE DAILY WITH A MEAL 180 tablet 0  . methimazole (TAPAZOLE) 5 MG tablet TAKE 1/2 TABLET( 2.5 MG) BY MOUTH DAILY 135 tablet 0  . metroNIDAZOLE (METROGEL) 1 % gel APPLY EXTERNALLY TO THE AFFECTED AREA DAILY 60 g 11  . Microlet Lancets MISC USE TO CHECK BLOOD SUGAR ONCE DAILY 100 each 0  . Multiple Vitamins-Minerals (MULTIVITAMIN WITH MINERALS) tablet Take 1 tablet by mouth daily.    . multivitamin-lutein (OCUVITE-LUTEIN) CAPS capsule Take 1 capsule by mouth daily.    . Omega-3 Fatty Acids (FISH OIL PO)  Take 1 capsule by mouth daily.    . polyethylene glycol powder (GLYCOLAX/MIRALAX) 17 GM/SCOOP powder Take 17 g by mouth daily. 3350 g 0  . TURMERIC PO Take 1 tablet by mouth daily.    . Vitamin D, Ergocalciferol, (DRISDOL) 1.25 MG (50000 UT) CAPS capsule Take 1 capsule (50,000 Units total) by mouth every 7 (seven) days. 4 capsule 0  . fluticasone (FLONASE) 50 MCG/ACT nasal spray Place 2 sprays into both nostrils daily for 10 days. 16 g 0   No current facility-administered medications on file prior to visit.     PAST MEDICAL HISTORY: Past Medical History:  Diagnosis Date  . Anal fissure   . Arthritis   . Bursitis   . Constipation   . COPD (chronic obstructive pulmonary disease) ([place])   . Diabetes mellitus without complication ([place])    sees Dr. Cruzita Lederer   . Diverticulitis of colon 02/2015  . Ectopic pregnancy   . Emphysema of lung ([place])   . Frozen shoulder   . Heart murmur   . Hyperlipidemia   . Hyperthyroidism   . Infertility, female   . Irregular heartbeat   . Joint pain   . Obesity   .  Osteoarthritis   . Sleep apnea   . Swallowing difficulty   . Thyroid disease    hyperthyroid    PAST SURGICAL HISTORY: Past Surgical History:  Procedure Laterality Date  . APPENDECTOMY    . CESAREAN SECTION    . COLONOSCOPY  09-10-10   per Dr. Acquanetta Sit, diverticulosis of descending colon and sigmoid, internal hemorrhoids, repeat in 10 yrs  . COLOSTOMY N/A 02/20/2015   Procedure: COLOSTOMY;  Surgeon: Jackolyn Confer, MD;  Location: [facility];  Service: General;  Laterality: N/A;  . COLOSTOMY REVISION  02/20/2015   Procedure: Sigmoid Colectomy;  Surgeon: Jackolyn Confer, MD;  Location: [facility];  Service: General;;  . CYSTOSCOPY N/A 02/20/2015   Procedure: Consuela Mimes;  Surgeon: Kathie Rhodes, MD;  Location: [facility];  Service: Urology;  Laterality: N/A;  . DILATION AND CURETTAGE OF UTERUS     x3  . ILEO LOOP COLOSTOMY CLOSURE N/A 09/26/2015   Procedure: LAPAROSCOPIC LYSIS OF ADHESIONS, COLOSTOMY CLOSURE;  Surgeon: Jackolyn Confer, MD;  Location: [facility];  Service: General;  Laterality: N/A;  . LAPAROTOMY N/A 02/20/2015   Procedure: Emergency EXPLORATORY and drainiage of interabdominal abcesses;  Surgeon: Jackolyn Confer, MD;  Location: [facility];  Service: General;  Laterality: N/A;  . SALPINGOOPHORECTOMY Left 02/20/2015   Procedure: SALPINGO OOPHORECTOMY;  Surgeon: Jackolyn Confer, MD;  Location: [facility];  Service: General;  Laterality: Left;  . TONSILLECTOMY  1980    SOCIAL HISTORY: Social History   Tobacco Use  . Smoking status: Current Every Day Smoker    Packs/day: 0.25    Years: 40.00    Pack years: 10.00    Types: Cigarettes  . Smokeless tobacco: Never Used  . Tobacco comment: less than 1/2 pack a day  Substance Use Topics  . Alcohol use: Yes    Alcohol/week: 0.0 standard drinks    Comment: occ  . Drug use: No    FAMILY HISTORY: Family History  Problem Relation Age of Onset  . Breast cancer Mother   . High blood pressure Mother   . Kidney disease Mother   . Sleep apnea  Mother   . Obesity Mother   . CAD Father        CABG  . Skin cancer Father   . Lung cancer Father   .  Diverticulitis Father   . Colon polyps Father   . High blood pressure Father   . Stroke Father   . Heart disease Father   . Sleep apnea Father   . COPD Other   . Cancer Other   . Heart disease Other   . Stroke Other   . Allergies Brother   . Breast cancer Maternal Grandmother   . Heart attack Maternal Grandfather   . Heart attack Paternal Grandfather   . Hypertension Neg Hx    ROS: Review of Systems  Gastrointestinal: Negative for nausea.  Endo/Heme/Allergies:       Negative for hypoglycemia. Negative for polyphagia.  Psychiatric/Behavioral: Positive for depression (emotional eating). Negative for suicidal ideas.       Negative for homicidal ideas.   PHYSICAL EXAM: Pt in no acute distress  RECENT LABS AND TESTS: BMET    Component Value Date/Time   NA 142 01/11/2019 1136   K 4.4 01/11/2019 1136   CL 102 01/11/2019 1136   CO2 22 01/11/2019 1136   GLUCOSE 87 01/11/2019 1136   GLUCOSE 89 11/16/2017 1655   BUN 9 01/11/2019 1136   CREATININE 0.68 01/11/2019 1136   CREATININE 0.68 02/26/2016 1654   CALCIUM 9.5 01/11/2019 1136   GFRNONAA 95 01/11/2019 1136   GFRNONAA >89 02/26/2016 1654   GFRAA 109 01/11/2019 1136   GFRAA >89 02/26/2016 1654   Lab Results  Component Value Date   HGBA1C 6.2 (A) 03/23/2019   HGBA1C 5.7 (A) 09/20/2018   HGBA1C 5.5 09/30/2017   HGBA1C 6.3 04/01/2017   HGBA1C 6.2 10/01/2016   Lab Results  Component Value Date   INSULIN 21.2 01/11/2019   CBC    Component Value Date/Time   WBC 9.5 11/16/2017 1655   RBC 4.69 11/16/2017 1655   HGB 14.6 11/16/2017 1655   HCT 43.4 11/16/2017 1655   PLT 288.0 11/16/2017 1655   MCV 92.5 11/16/2017 1655   MCH 30.1 10/07/2015 0546   MCHC 33.5 11/16/2017 1655   RDW 15.3 11/16/2017 1655   LYMPHSABS 3.1 11/16/2017 1655   MONOABS 0.6 11/16/2017 1655   EOSABS 0.1 11/16/2017 1655   BASOSABS 0.1  11/16/2017 1655   Iron/TIBC/Ferritin/ %Sat No results found for: IRON, TIBC, FERRITIN, IRONPCTSAT Lipid Panel     Component Value Date/Time   CHOL 313 (H) 03/23/2019 1005   TRIG (H) 03/23/2019 1005    488.0 Triglyceride is over 400; calculations on Lipids are invalid.   HDL 24.00 (L) 03/23/2019 1005   CHOLHDL 13 03/23/2019 1005   VLDL 56.8 (H) 05/23/2014 0933   LDLCALC 93 11/02/2011 0947   LDLDIRECT 64.0 03/23/2019 1005   Hepatic Function Panel     Component Value Date/Time   PROT 7.0 01/11/2019 1136   ALBUMIN 4.6 01/11/2019 1136   AST 23 01/11/2019 1136   ALT 25 01/11/2019 1136   ALKPHOS 84 01/11/2019 1136   BILITOT 0.3 01/11/2019 1136   BILIDIR 0.1 11/16/2017 1655   IBILI 0.6 02/25/2015 0928      Component Value Date/Time   TSH 2.26 03/23/2019 1005   TSH 2.44 09/20/2018 1118   TSH 4.06 11/09/2017 1557   Results for RECHELLE, SCHULER (MRN [id number]) as of 08/15/2019 14:16  Ref. Range 01/11/2019 11:36  Vitamin D, 25-Hydroxy Latest Ref Range: 30.0 - 100.0 ng/mL 27.3 (L)   I, Michaelene Song, am acting as Location manager for [employer], DO  I have reviewed the above documentation for accuracy and completeness, and I agree with the above. Glenard Haring  [place] Shark, DO

## 2019-08-20 ENCOUNTER — Encounter: Payer: BLUE CROSS/BLUE SHIELD | Admitting: Obstetrics & Gynecology

## 2019-08-25 ENCOUNTER — Ambulatory Visit (INDEPENDENT_AMBULATORY_CARE_PROVIDER_SITE_OTHER): Payer: BC Managed Care – PPO | Admitting: Professional

## 2019-08-25 DIAGNOSIS — F4321 Adjustment disorder with depressed mood: Secondary | ICD-10-CM | POA: Diagnosis not present

## 2019-08-31 ENCOUNTER — Ambulatory Visit (HOSPITAL_COMMUNITY)
Admission: RE | Admit: 2019-08-31 | Discharge: 2019-08-31 | Disposition: A | Discharge status: Home / Self Care | Payer: BC Managed Care – PPO | Source: Ambulatory Visit | Attending: Internal Medicine | Admitting: Internal Medicine

## 2019-08-31 ENCOUNTER — Other Ambulatory Visit: Payer: Self-pay

## 2019-08-31 DIAGNOSIS — I6523 Occlusion and stenosis of bilateral carotid arteries: Secondary | ICD-10-CM | POA: Diagnosis not present

## 2019-09-04 ENCOUNTER — Telehealth (INDEPENDENT_AMBULATORY_CARE_PROVIDER_SITE_OTHER): Payer: BC Managed Care – PPO | Admitting: Bariatrics

## 2019-09-05 ENCOUNTER — Other Ambulatory Visit: Payer: Self-pay

## 2019-09-05 ENCOUNTER — Ambulatory Visit (INDEPENDENT_AMBULATORY_CARE_PROVIDER_SITE_OTHER): Payer: BC Managed Care – PPO | Admitting: Obstetrics & Gynecology

## 2019-09-05 ENCOUNTER — Encounter: Payer: Self-pay | Admitting: Obstetrics & Gynecology

## 2019-09-05 VITALS — BP 124/80 | Ht 60.0 in | Wt 224.0 lb

## 2019-09-05 DIAGNOSIS — Z13820 Encounter for screening for osteoporosis: Secondary | ICD-10-CM | POA: Diagnosis not present

## 2019-09-05 DIAGNOSIS — Z01419 Encounter for gynecological examination (general) (routine) without abnormal findings: Secondary | ICD-10-CM

## 2019-09-05 DIAGNOSIS — Z780 Asymptomatic menopausal state: Secondary | ICD-10-CM

## 2019-09-05 DIAGNOSIS — Z6841 Body Mass Index (BMI) 40.0 and over, adult: Secondary | ICD-10-CM

## 2019-09-05 NOTE — Progress Notes (Signed)
Al[facility] Mar 14, 1956 [id number]   History:    63 y.o. G2P1A1L1 Married. Son 48 yo. S/P Lt SO. Works in [employer].  RP: Established patient presenting for annual gyn exam  HPI: Menopause. No HRT. No PMB. No pelvic pain. Had a bowel perforation secondary to Diverticulitis with Bowel resection, had colostomy. Following many surgeries, developed a large Left Abdominal Hernia. Needs to loose weight before repair. Obesity with BMI at 43.75, stable x last year. Osteoarthritis.Cigarette smoker <1/2 pack per day. Many medical issues including DM and Hyperthyroidism.  Past medical history,surgical history, family history and social history were all reviewed and documented in the EPIC chart.  Gynecologic History Patient's last menstrual period was 03/22/2012. Contraception: post menopausal status Last Pap: 05/25/2017. Results were: Negative/HPV HR Negative Last mammogram: 11/2018. Results were: Normal per patient Bone Density: Never Colonoscopy: 2011  Obstetric History OB History  Gravida Para Term Preterm AB Living  2 1     1 1   SAB TAB Ectopic Multiple Live Births      1        # Outcome Date GA Lbr Len/2nd Weight Sex Delivery Anes PTL Lv  2 Ectopic           1 Para              ROS: A ROS was performed and pertinent positives and negatives are included in the history.  GENERAL: No fevers or chills. HEENT: No change in vision, no earache, sore throat or sinus congestion. NECK: No pain or stiffness. CARDIOVASCULAR: No chest pain or pressure. No palpitations. PULMONARY: No shortness of breath, cough or wheeze. GASTROINTESTINAL: No abdominal pain, nausea, vomiting or diarrhea, melena or bright red blood per rectum. GENITOURINARY: No urinary frequency, urgency, hesitancy or dysuria. MUSCULOSKELETAL: No joint or muscle pain, no back pain, no recent trauma. DERMATOLOGIC: No rash, no itching, no lesions. ENDOCRINE: No polyuria, polydipsia, no heat or  cold intolerance. No recent change in weight. HEMATOLOGICAL: No anemia or easy bruising or bleeding. NEUROLOGIC: No headache, seizures, numbness, tingling or weakness. PSYCHIATRIC: No depression, no loss of interest in normal activity or change in sleep pattern.     Exam:   BP 124/80   Ht 5' (1.524 m)   Wt 224 lb (101.6 kg)   LMP 03/22/2012   BMI 43.75 kg/m   Body mass index is 43.75 kg/m.  General appearance : Well developed well nourished female. No acute distress HEENT: Eyes: no retinal hemorrhage or exudates,  Neck supple, trachea midline, no carotid bruits, no thyroidmegaly Lungs: Clear to auscultation, no rhonchi or wheezes, or rib retractions  Heart: Regular rate and rhythm, no murmurs or gallops Breast:Examined in sitting and supine position were symmetrical in appearance, no palpable masses or tenderness,  no skin retraction, no nipple inversion, no nipple discharge, no skin discoloration, no axillary or supraclavicular lymphadenopathy Abdomen: no palpable masses or tenderness, no rebound or guarding Extremities: no edema or skin discoloration or tenderness  Pelvic: Vulva: Normal             Vagina: No gross lesions or discharge  Cervix: No gross lesions or discharge  Uterus  RV, normal size, shape and consistency, non-tender and mobile  Adnexa  Without masses or tenderness.  Difficult to assess the left adnexa given the large abdominal hernia on the left, but patient is s/p Lt SO.  Anus: Normal   Assessment/Plan:  63 y.o. female for annual exam   1. Well female exam with  routine gynecological exam Normal gynecologic exam status post left salpingo-oophorectomy and menopause.  Left adnexa was more difficult to assess because of the large abdominal hernia to the left.  Pap test August 2018 was negative with negative high-risk HPV, will repeat a Pap test next year.  Breast exam normal.  Screening mammogram February 2020 was normal per patient.  Colonoscopy 2011.  Health labs  with family physician.  2. Postmenopausal Well on no hormone replacement therapy.  No postmenopausal bleeding.  3. Screening for osteoporosis We will follow-up here for a bone density.  Vitamin D supplements, calcium intake of 1200 mg daily and regular weightbearing physical activity is recommended. - DG Bone Density; Future  4. Class 3 severe obesity due to excess calories with serious comorbidity and body mass index (BMI) of 40.0 to 44.9 in adult [facility]) Low calorie/carb diet such as [employer] recommended.  Aerobic physical activities 5 times a week and weightlifting every 2 days with precautions given the large left abdominal hernia.  Princess Bruins MD, 4:24 PM 09/05/2019

## 2019-09-07 ENCOUNTER — Telehealth: Payer: Self-pay | Admitting: *Deleted

## 2019-09-07 NOTE — Telephone Encounter (Signed)
Staff message sent to Gae Bon I called [employer] today because I had not heard back on in lab sleep study request. I was informed the test has been denied. Please notify Margarite Gouge she can call [phone] to do Appeal.

## 2019-09-08 ENCOUNTER — Ambulatory Visit (INDEPENDENT_AMBULATORY_CARE_PROVIDER_SITE_OTHER): Payer: BC Managed Care – PPO | Admitting: Professional

## 2019-09-08 DIAGNOSIS — F4321 Adjustment disorder with depressed mood: Secondary | ICD-10-CM | POA: Diagnosis not present

## 2019-09-10 ENCOUNTER — Telehealth: Payer: Self-pay | Admitting: *Deleted

## 2019-09-10 DIAGNOSIS — G4733 Obstructive sleep apnea (adult) (pediatric): Secondary | ICD-10-CM

## 2019-09-10 NOTE — Telephone Encounter (Signed)
-----   Message from Sueanne Margarita, MD sent at 09/10/2019  2:29 PM EST ----- Regarding: RE: Sleep study 08/21/2004 Her sleep study is 63 years old.  Does she currently have a PAP machien?  Traci ----- Message ----- From: Freada Bergeron, CMA Sent: 09/10/2019  12:32 PM EST To: Sueanne Margarita, MD Subject: Sleep study 08/21/2004                         Please advise. ----- Message ----- From: Murrell Converse Sent: 09/10/2019  10:45 AM EST To: Freada Bergeron, CMA  I callled to appeal and they denied it because she has had a prior sleep study that was positive. She was treated by Dr. Danton Sewer in the past. If he's still doing sleep we can refer her back to him or have Dr. Radford Pax order cpap based on previous study. Please let patient know. Thanks, Selinda Eon ----- Message ----- From: Freada Bergeron, CMA Sent: 09/07/2019   3:24 PM EST To: Imogene Burn, PA-C   ----- Message ----- From: Lauralee Evener, CMA Sent: 09/07/2019  11:25 AM EST To: Freada Bergeron, CMA Subject: RE: precert                                    Called [employer] today because I hadn't heard back from them and they told me split night has been denied. They also denied HST. Margarite Gouge ordered this. She can call [phone] for appeal. ----- Message ----- From: Freada Bergeron, CMA Sent: 08/07/2019   6:37 PM EST To: [facility] Sleep Studies Subject: precert                                         ----- Message ----- From: Cleon Gustin, RN Sent: 08/07/2019   9:40 AM EDT To: Freada Bergeron, CMA Subject: Split Night                                    Per Ermalinda Barrios, PA patient needs sleep study for OSA. Please arrange. Patient will need to see Dr. Radford Pax after. Thanks

## 2019-09-10 NOTE — Telephone Encounter (Signed)
Called patient lmtcb 

## 2019-09-12 ENCOUNTER — Encounter: Payer: Self-pay | Admitting: Obstetrics & Gynecology

## 2019-09-12 NOTE — Patient Instructions (Signed)
1. Well female exam with routine gynecological exam Normal gynecologic exam status post left salpingo-oophorectomy and menopause.  Left adnexa was more difficult to assess because of the large abdominal hernia to the left.  Pap test August 2018 was negative with negative high-risk HPV, will repeat a Pap test next year.  Breast exam normal.  Screening mammogram February 2020 was normal per patient.  Colonoscopy 2011.  Health labs with family physician.  2. Postmenopausal Well on no hormone replacement therapy.  No postmenopausal bleeding.  3. Screening for osteoporosis We will follow-up here for a bone density.  Vitamin D supplements, calcium intake of 1200 mg daily and regular weightbearing physical activity is recommended. - DG Bone Density; Future  4. Class 3 severe obesity due to excess calories with serious comorbidity and body mass index (BMI) of 40.0 to 44.9 in adult [facility]) Low calorie/carb diet such as [employer] recommended.  Aerobic physical activities 5 times a week and weightlifting every 2 days with precautions given the large left abdominal hernia.  Alison Best, it was a pleasure seeing you today!

## 2019-09-14 ENCOUNTER — Other Ambulatory Visit (INDEPENDENT_AMBULATORY_CARE_PROVIDER_SITE_OTHER): Payer: Self-pay | Admitting: Bariatrics

## 2019-09-14 DIAGNOSIS — Z20828 Contact with and (suspected) exposure to other viral communicable diseases: Secondary | ICD-10-CM | POA: Diagnosis not present

## 2019-09-14 DIAGNOSIS — Z03818 Encounter for observation for suspected exposure to other biological agents ruled out: Secondary | ICD-10-CM | POA: Diagnosis not present

## 2019-09-14 DIAGNOSIS — F3289 Other specified depressive episodes: Secondary | ICD-10-CM

## 2019-09-14 DIAGNOSIS — Z7189 Other specified counseling: Secondary | ICD-10-CM | POA: Diagnosis not present

## 2019-09-14 NOTE — Telephone Encounter (Signed)
RE: Sleep study 08/21/2004 Sueanne Margarita, MD  Freada Bergeron, CMA        Sharyn Lull, Dr. Gwenette Greet is on longer doing Sleep. I do not feel comfortable starting someone back on CPAP who has not been on CPAP for years therefore I recommend referring her to one of the sleep MDs at [facility]monary who worked with Dr. Gwenette Greet and does sleep [place], [place], [place])   [place]

## 2019-09-14 NOTE — Telephone Encounter (Signed)
Returned call: RE: Sleep study 08/21/2004 Freada Bergeron, CMA  Sueanne Margarita, MD        Patient said she had a cpap 5 years ago and it got returned for non-compliance because she had fell very ill and could use it.

## 2019-09-18 ENCOUNTER — Other Ambulatory Visit: Payer: Self-pay | Admitting: Internal Medicine

## 2019-09-18 DIAGNOSIS — R7303 Prediabetes: Secondary | ICD-10-CM

## 2019-09-24 ENCOUNTER — Ambulatory Visit: Payer: BLUE CROSS/BLUE SHIELD | Admitting: Internal Medicine

## 2019-09-25 ENCOUNTER — Encounter (INDEPENDENT_AMBULATORY_CARE_PROVIDER_SITE_OTHER): Payer: Self-pay | Admitting: Bariatrics

## 2019-09-25 ENCOUNTER — Telehealth (INDEPENDENT_AMBULATORY_CARE_PROVIDER_SITE_OTHER): Payer: BC Managed Care – PPO | Admitting: Bariatrics

## 2019-09-25 ENCOUNTER — Other Ambulatory Visit: Payer: Self-pay

## 2019-09-25 DIAGNOSIS — F3289 Other specified depressive episodes: Secondary | ICD-10-CM

## 2019-09-25 DIAGNOSIS — Z6841 Body Mass Index (BMI) 40.0 and over, adult: Secondary | ICD-10-CM

## 2019-09-25 DIAGNOSIS — E559 Vitamin D deficiency, unspecified: Secondary | ICD-10-CM

## 2019-09-25 MED ORDER — TOPIRAMATE 25 MG PO TABS: 25.0000 mg | ORAL_TABLET | Freq: Two times a day (BID) | ORAL | 0 refills | Status: DC

## 2019-09-25 MED ORDER — VITAMIN D (ERGOCALCIFEROL) 1.25 MG (50000 UNIT) PO CAPS: 50000.0000 [IU] | ORAL_CAPSULE | ORAL | 0 refills | Status: DC

## 2019-09-26 NOTE — Progress Notes (Signed)
Office: [phone]  /  Fax: [phone] TeleHealth Visit:  Alison Best has verbally consented to this TeleHealth visit today. The patient is located at home, the provider is located at the [employer] [facility] office. The participants in this visit include the listed provider and patient. The visit was conducted today via Webex.  HPI:   Chief Complaint: OBESITY Alison Best is here to discuss her progress with her obesity treatment plan. She is on the Category 2 plan and is following her eating plan approximately 80% of the time. She states she is walking 3500 steps 7 times per week. Talitha has lost 3 lbs (weight 220).  We were unable to weigh the patient today for this TeleHealth visit. She feels as if she has lost 3 lbs since her last visit. She has lost 0 lbs since starting treatment with [name].  Depression with emotional eating behaviors Alison Best is struggling with emotional eating and using food for comfort to the extent that it is negatively impacting her health. She often snacks when she is not hungry. Alison Best sometimes feels she is out of control and then feels guilty that she made poor food choices. She has been working on behavior modification techniques to help reduce her emotional eating and has been somewhat successful. Alison Best is taking Topamax and reports better control of her emotional eating behavior. She shows no sign of suicidal or homicidal ideations.  Depression screen [facility] 2/9 02/21/2019 01/11/2019 07/26/2017 02/05/2016 06/06/2014  Decreased Interest 0 2 0 0 0  Down, Depressed, Hopeless 1 1 0 0 0  PHQ - 2 Score 1 3 0 0 0  Altered sleeping 0 2 - - -  Tired, decreased energy 1 2 - - -  Change in appetite 1 1 - - -  Feeling bad or failure about yourself  0 1 - - -  Trouble concentrating 0 0 - - -  Moving slowly or fidgety/restless 0 2 - - -  Suicidal thoughts 0 0 - - -  PHQ-9 Score 3 11 - - -  Difficult doing work/chores - Somewhat difficult - - -  Some recent data might be  hidden   Vitamin D deficiency Alison Best has a diagnosis of Vitamin D deficiency. She is currently taking prescription Vit D and denies nausea, vomiting or muscle weakness. She reports minimal sunlight exposure.  ASSESSMENT AND PLAN:  Other depression - Plan: topiramate (TOPAMAX) 25 MG tablet  Vitamin D deficiency - Plan: Vitamin D, Ergocalciferol, (DRISDOL) 1.25 MG (50000 UT) CAPS capsule  Class 3 severe obesity due to excess calories with serious comorbidity and body mass index (BMI) of 40.0 to 44.9 in adult [facility])  Other depression - With emotional eating  - Plan: topiramate (TOPAMAX) 25 MG tablet  PLAN:  Emotional Eating Behaviors (other depression) We discussed behavior modification techniques today to help Alison Best deal with her emotional eating behaviors. Alison Best was given a prescription for Topamax 25 mg 1 PO BID #60 with 0 refills. She agrees to follow-up with our clinic in 2-3 weeks.  Vitamin D Deficiency Alison Best was informed that low Vitamin D levels contributes to fatigue and are associated with obesity, breast, and colon cancer. She agrees to continue to take prescription Vit D @ 50,000 IU every week #4 with 0 refills and will follow-up for routine testing of Vitamin D, at least 2-3 times per year. She was informed of the risk of over-replacement of Vitamin D and agrees to not increase her dose unless she discusses this with  [name] first. Alison Best agrees to follow-up with our clinic in 2-3 weeks.  Obesity Alison Best is currently in the action stage of change. As such, her goal is to continue with weight loss efforts. She has agreed to follow the Category 2 plan. Alison Best will work on meal planning, increase her protein and vegetables, will weigh herself at home, will wear a FitBit, and will log her water intake. Alison Best has been instructed to walk 3500 steps 7 days per week (wearing a FitBit) for weight loss and overall health benefits. We discussed the following Behavioral Modification Strategies today:  increasing lean protein intake, decreasing simple carbohydrates, increasing vegetables, increase H20 intake, decrease eating out, no skipping meals, work on meal planning and easy cooking plans, keeping healthy foods in the home, travel eating strategies, holiday eating strategies, and celebration eating strategies.  Alison Best has agreed to follow-up with our clinic in 2-3 weeks. She was informed of the importance of frequent follow-up visits to maximize her success with intensive lifestyle modifications for her multiple health conditions.  ALLERGIES: Allergies  Allergen Reactions  . Levofloxacin In D5w Other (See Comments)    Achilles tendon weakness  . Mangifera Indica Anaphylaxis  . Mango Flavor Swelling  . Bupropion Nausea And Vomiting  . Levaquin [Levofloxacin] Other (See Comments)    Severe tendonitis Tolerates Cipro    MEDICATIONS: Current Outpatient Medications on File Prior to Visit  Medication Sig Dispense Refill  . acetaminophen (TYLENOL) 325 MG tablet Take 2 tablets (650 mg total) by mouth every 6 (six) hours as needed for moderate pain or headache.    . albuterol (PROVENTIL HFA;VENTOLIN HFA) 108 (90 Base) MCG/ACT inhaler INHALE 2 PUFFS INTO THE LUNGS EVERY 4 HOURS AS NEEDED FOR WHEEZING 8.5 g 6  . aspirin 81 MG tablet Take 81 mg by mouth daily.    Marland Kitchen atorvastatin (LIPITOR) 20 MG tablet Take 1 tablet (20 mg total) by mouth daily. 90 tablet 3  . Coenzyme Q10 (CO Q 10 PO) Take 1 capsule by mouth daily.    . diclofenac (VOLTAREN) 75 MG EC tablet TAKE 1 TABLET(75 MG) BY MOUTH TWICE DAILY 60 tablet 11  . diltiazem (CARDIZEM CD) 120 MG 24 hr capsule TAKE 1 CAPSULE(120 MG) BY MOUTH DAILY 90 capsule 3  . fluticasone (FLONASE) 50 MCG/ACT nasal spray Place 2 sprays into both nostrils daily. 16 g 6  . fluticasone (FLONASE) 50 MCG/ACT nasal spray Place 2 sprays into both nostrils daily for 10 days. 16 g 0  . furosemide (LASIX) 20 MG tablet TAKE 1 TABLET BY MOUTH DAILY AS NEEDED FOR FLUID  RETENTION 30 tablet 2  . glucose blood (CONTOUR NEXT TEST) test strip Use to check blood sugar once a day. 100 each 12  . Icosapent Ethyl (VASCEPA) 1 g CAPS Take 2 capsules (2 g total) by mouth 2 (two) times daily. 360 capsule 3  . metFORMIN (GLUCOPHAGE) 500 MG tablet TAKE 1 TABLET(500 MG) BY MOUTH TWICE DAILY WITH A MEAL 180 tablet 0  . methimazole (TAPAZOLE) 5 MG tablet TAKE 1/2 TABLET( 2.5 MG) BY MOUTH DAILY 135 tablet 0  . metroNIDAZOLE (METROGEL) 1 % gel APPLY EXTERNALLY TO THE AFFECTED AREA DAILY (Patient not taking: Reported on 09/05/2019) 60 g 11  . Microlet Lancets MISC USE TO CHECK BLOOD SUGAR ONCE DAILY 100 each 0  . Multiple Vitamins-Minerals (MULTIVITAMIN WITH MINERALS) tablet Take 1 tablet by mouth daily.    . multivitamin-lutein (OCUVITE-LUTEIN) CAPS capsule Take 1 capsule by mouth daily.    Marland Kitchen  polyethylene glycol powder (GLYCOLAX/MIRALAX) 17 GM/SCOOP powder Take 17 g by mouth daily. (Patient not taking: Reported on 09/05/2019) 3350 g 0  . TURMERIC PO Take 1 tablet by mouth daily.     No current facility-administered medications on file prior to visit.     PAST MEDICAL HISTORY: Past Medical History:  Diagnosis Date  . Anal fissure   . Arthritis   . Bursitis   . Constipation   . COPD (chronic obstructive pulmonary disease) ([place])   . Diabetes mellitus without complication ([place])    sees Dr. Cruzita Lederer   . Diverticulitis of colon 02/2015  . Ectopic pregnancy   . Emphysema of lung ([place])   . Frozen shoulder   . Heart murmur   . Hernia, abdominal   . Hyperlipidemia   . Hyperthyroidism   . Infertility, female   . Irregular heartbeat   . Joint pain   . Obesity   . Osteoarthritis   . Sleep apnea   . Swallowing difficulty   . Thyroid disease    hyperthyroid    PAST SURGICAL HISTORY: Past Surgical History:  Procedure Laterality Date  . APPENDECTOMY    . CESAREAN SECTION    . COLONOSCOPY  09-10-10   per Dr. Acquanetta Sit, diverticulosis of descending colon and sigmoid,  internal hemorrhoids, repeat in 10 yrs  . COLOSTOMY N/A 02/20/2015   Procedure: COLOSTOMY;  Surgeon: Jackolyn Confer, MD;  Location: [facility];  Service: General;  Laterality: N/A;  . COLOSTOMY REVISION  02/20/2015   Procedure: Sigmoid Colectomy;  Surgeon: Jackolyn Confer, MD;  Location: [facility];  Service: General;;  . CYSTOSCOPY N/A 02/20/2015   Procedure: Consuela Mimes;  Surgeon: Kathie Rhodes, MD;  Location: [facility];  Service: Urology;  Laterality: N/A;  . DILATION AND CURETTAGE OF UTERUS     x3  . ILEO LOOP COLOSTOMY CLOSURE N/A 09/26/2015   Procedure: LAPAROSCOPIC LYSIS OF ADHESIONS, COLOSTOMY CLOSURE;  Surgeon: Jackolyn Confer, MD;  Location: [facility];  Service: General;  Laterality: N/A;  . LAPAROTOMY N/A 02/20/2015   Procedure: Emergency EXPLORATORY and drainiage of interabdominal abcesses;  Surgeon: Jackolyn Confer, MD;  Location: [facility];  Service: General;  Laterality: N/A;  . SALPINGOOPHORECTOMY Left 02/20/2015   Procedure: SALPINGO OOPHORECTOMY;  Surgeon: Jackolyn Confer, MD;  Location: [facility];  Service: General;  Laterality: Left;  . TONSILLECTOMY  1980    SOCIAL HISTORY: Social History   Tobacco Use  . Smoking status: Current Every Day Smoker    Packs/day: 0.25    Years: 40.00    Pack years: 10.00    Types: Cigarettes  . Smokeless tobacco: Never Used  . Tobacco comment: less than 1/2 pack a day  Substance Use Topics  . Alcohol use: Yes    Alcohol/week: 0.0 standard drinks    Comment: rare  . Drug use: No    FAMILY HISTORY: Family History  Problem Relation Age of Onset  . Breast cancer Mother   . High blood pressure Mother   . Kidney disease Mother   . Sleep apnea Mother   . Obesity Mother   . CAD Father        CABG  . Skin cancer Father   . Lung cancer Father   . Diverticulitis Father   . Colon polyps Father   . High blood pressure Father   . Stroke Father   . Heart disease Father   . Sleep apnea Father   . COPD Other   . Cancer Other   . Heart  disease Other   .  Stroke Other   . Allergies Brother   . Diabetes Brother   . Breast cancer Maternal Grandmother   . Heart attack Maternal Grandfather   . Heart attack Paternal Grandfather   . Hypertension Neg Hx    ROS: Review of Systems  Gastrointestinal: Negative for nausea and vomiting.  Musculoskeletal:       Negative for muscle weakness.  Psychiatric/Behavioral: Positive for depression (emotional eating). Negative for suicidal ideas.       Negative for homicidal ideas.   PHYSICAL EXAM: Pt in no acute distress  RECENT LABS AND TESTS: BMET    Component Value Date/Time   NA 142 01/11/2019 1136   K 4.4 01/11/2019 1136   CL 102 01/11/2019 1136   CO2 22 01/11/2019 1136   GLUCOSE 87 01/11/2019 1136   GLUCOSE 89 11/16/2017 1655   BUN 9 01/11/2019 1136   CREATININE 0.68 01/11/2019 1136   CREATININE 0.68 02/26/2016 1654   CALCIUM 9.5 01/11/2019 1136   GFRNONAA 95 01/11/2019 1136   GFRNONAA >89 02/26/2016 1654   GFRAA 109 01/11/2019 1136   GFRAA >89 02/26/2016 1654   Lab Results  Component Value Date   HGBA1C 6.2 (A) 03/23/2019   HGBA1C 5.7 (A) 09/20/2018   HGBA1C 5.5 09/30/2017   HGBA1C 6.3 04/01/2017   HGBA1C 6.2 10/01/2016   Lab Results  Component Value Date   INSULIN 21.2 01/11/2019   CBC    Component Value Date/Time   WBC 9.5 11/16/2017 1655   RBC 4.69 11/16/2017 1655   HGB 14.6 11/16/2017 1655   HCT 43.4 11/16/2017 1655   PLT 288.0 11/16/2017 1655   MCV 92.5 11/16/2017 1655   MCH 30.1 10/07/2015 0546   MCHC 33.5 11/16/2017 1655   RDW 15.3 11/16/2017 1655   LYMPHSABS 3.1 11/16/2017 1655   MONOABS 0.6 11/16/2017 1655   EOSABS 0.1 11/16/2017 1655   BASOSABS 0.1 11/16/2017 1655   Iron/TIBC/Ferritin/ %Sat No results found for: IRON, TIBC, FERRITIN, IRONPCTSAT Lipid Panel     Component Value Date/Time   CHOL 313 (H) 03/23/2019 1005   TRIG (H) 03/23/2019 1005    488.0 Triglyceride is over 400; calculations on Lipids are invalid.   HDL 24.00 (L) 03/23/2019 1005    CHOLHDL 13 03/23/2019 1005   VLDL 56.8 (H) 05/23/2014 0933   LDLCALC 93 11/02/2011 0947   LDLDIRECT 64.0 03/23/2019 1005   Hepatic Function Panel     Component Value Date/Time   PROT 7.0 01/11/2019 1136   ALBUMIN 4.6 01/11/2019 1136   AST 23 01/11/2019 1136   ALT 25 01/11/2019 1136   ALKPHOS 84 01/11/2019 1136   BILITOT 0.3 01/11/2019 1136   BILIDIR 0.1 11/16/2017 1655   IBILI 0.6 02/25/2015 0928      Component Value Date/Time   TSH 2.26 03/23/2019 1005   TSH 2.44 09/20/2018 1118   TSH 4.06 11/09/2017 1557   Results for GERIYAH, GACCIONE (MRN [id number]) as of 09/26/2019 07:27  Ref. Range 01/11/2019 11:36  Vitamin D, 25-Hydroxy Latest Ref Range: 30.0 - 100.0 ng/mL 27.3 (L)   I, Michaelene Song, am acting as Location manager for [employer], DO  I have reviewed the above documentation for accuracy and completeness, and I agree with the above. -Jearld Lesch, DO

## 2019-09-28 ENCOUNTER — Ambulatory Visit: Payer: BC Managed Care – PPO | Admitting: Internal Medicine

## 2019-09-28 ENCOUNTER — Encounter: Payer: Self-pay | Admitting: Internal Medicine

## 2019-09-28 ENCOUNTER — Ambulatory Visit (INDEPENDENT_AMBULATORY_CARE_PROVIDER_SITE_OTHER): Payer: BC Managed Care – PPO | Admitting: Internal Medicine

## 2019-09-28 ENCOUNTER — Other Ambulatory Visit: Payer: Self-pay

## 2019-09-28 DIAGNOSIS — R7303 Prediabetes: Secondary | ICD-10-CM

## 2019-09-28 DIAGNOSIS — E0580 Other thyrotoxicosis without thyrotoxic crisis or storm: Secondary | ICD-10-CM

## 2019-09-28 DIAGNOSIS — E042 Nontoxic multinodular goiter: Secondary | ICD-10-CM | POA: Diagnosis not present

## 2019-09-28 DIAGNOSIS — E0590 Thyrotoxicosis, unspecified without thyrotoxic crisis or storm: Secondary | ICD-10-CM

## 2019-09-28 DIAGNOSIS — E781 Pure hyperglyceridemia: Secondary | ICD-10-CM

## 2019-09-28 DIAGNOSIS — Z6841 Body Mass Index (BMI) 40.0 and over, adult: Secondary | ICD-10-CM

## 2019-09-28 MED ORDER — METHIMAZOLE 5 MG PO TABS: ORAL_TABLET | ORAL | 3 refills | Status: DC

## 2019-09-28 MED ORDER — METFORMIN HCL 500 MG PO TABS: ORAL_TABLET | ORAL | 3 refills | Status: DC

## 2019-09-28 NOTE — Patient Instructions (Addendum)
Please continue: - Metformin 500 mg 2x a day  Targets: - before meals: 80-130 (for you: 80-120) - after meals: <180 (for you: <150)  Continue: - Methimazole 2.5 mg daily  Please come back for another set of thyroid labs as soon as safe.  Please come back for a follow-up appointment in 6 months.

## 2019-09-28 NOTE — Progress Notes (Addendum)
Patient ID: Alison Best [facility], female   DOB: [birth year], 63 y.o.   MRN: [id number]  Patient location: Home My location: Office Persons participating in the virtual visit: patient, provider  Referring Provider: Laurey Morale, MD  I connected with the patient on 09/28/19 at 11:21 AM EST by a video enabled telemedicine application and verified that I am speaking with the correct person.   I discussed the limitations of evaluation and management by telemedicine and the availability of in person appointments. The patient expressed understanding and agreed to proceed.   Details of the encounter are shown below.  HPI  Alison Best is a 63 y.o.-year-old female, returning for f/u for subclinical thyrotoxicosis and MNG and also prediabetes. Last visit 6 months ago.  She works from home.  Subclinical thyrotoxicosis: -Patient with mild Graves' disease.  We started methimazole in 05/2015.  She is currently on 2.5 mg daily.  Reviewed patient's TFTs: Lab Results  Component Value Date   TSH 2.26 03/23/2019   TSH 2.44 09/20/2018   TSH 4.06 11/09/2017   TSH 10.28 (H) 09/30/2017   TSH 1.74 04/01/2017   TSH 1.42 02/09/2017   TSH 0.06 (L) 10/01/2016   TSH 0.88 05/12/2016   TSH 4.46 02/26/2016   TSH 0.60 12/29/2015   FREET4 0.77 03/23/2019   FREET4 0.82 09/20/2018   FREET4 0.81 11/09/2017   FREET4 0.61 09/30/2017   FREET4 0.70 04/01/2017   FREET4 0.62 02/09/2017   FREET4 1.18 10/01/2016   FREET4 0.66 05/12/2016   FREET4 0.50 (L) 02/26/2016   FREET4 0.96 12/29/2015   Lab Results  Component Value Date   TSI 111 06/18/2014   07/10/2014 Uptake and scan: The 24 hr uptake by the thyroid gland is 28.5%. Normal 24 hr uptakeis 10-30 %. On thyroid imaging, the thyroid activity appears mildly heterogeneous in the right lobe with a possible cold nodule inferiorly. The left lobe activity appears homogeneous.  Thyroid nodules:  -Diagnosed in 06/2014  Reviewedprevious  ultrasound reports that showed several bilateral thyroid nodules, with a right dominant 1.5 x 1.1 x 1.3 cm nodule.  We performed another thyroid ultrasound on 10/24/2018, which showed dissolution of the previous thyroid nodules.  The thyroid gland appears heterogeneous (please see assessment section).  Pt denies: - feeling nodules in neck - hoarseness - dysphagia - choking - SOB with lying down  Prediabetes:  Reviewed HbA1c levels: Lab Results  Component Value Date   HGBA1C 6.2 (A) 03/23/2019   HGBA1C 5.7 (A) 09/20/2018   HGBA1C 5.5 09/30/2017   She continues on Metformin 500 mg 2x a day with meals.  No GI symptoms.  She has GERD, but she does not feel that this is from Metformin.  Kidney tests are normal Lab Results  Component Value Date   BUN 9 01/11/2019   BUN 12 11/16/2017   CREATININE 0.68 01/11/2019   CREATININE 0.71 11/16/2017   She has a history of hypertriglyceridemia: Lab Results  Component Value Date   CHOL 313 (H) 03/23/2019   HDL 24.00 (L) 03/23/2019   LDLCALC 93 11/02/2011   LDLDIRECT 64.0 03/23/2019   TRIG (H) 03/23/2019    488.0 Triglyceride is over 400; calculations on Lipids are invalid.   CHOLHDL 13 03/23/2019  She is now on Vascepa (has GERD from this - takes it 2x a day) and Lipitor 20. Now off Fish oil.  She cut out alcohol after the above results returned.  She is seen in the: Weight loss clinic and she started to lose  weight >> lost 11 lbs since 05/2019. She is now on Topiramate >> less carbohydrate cravings.  She had colon resection in summer 2016, after an episode of diverticulitis >> she is s/p colonostomy closure.  ROS: Constitutional: no weight gain/+ weight loss, no fatigue, no subjective hyperthermia, no subjective hypothermia Eyes: no blurry vision, no xerophthalmia ENT: no sore throat, + see HPI Cardiovascular: no CP/no SOB/no palpitations/no leg swelling Respiratory: no cough/no SOB/no wheezing Gastrointestinal: no N/no V/no D/no  C/+ acid reflux with Vascepa Musculoskeletal: no muscle aches/no joint aches Skin: no rashes, no hair loss Neurological: no tremors/no numbness/no tingling/no dizziness  I reviewed pt's medications, allergies, PMH, social hx, family hx, and changes were documented in the history of present illness. Otherwise, unchanged from my initial visit note.  Past Medical History:  Diagnosis Date  . Anal fissure   . Arthritis   . Bursitis   . Constipation   . COPD (chronic obstructive pulmonary disease) ([place])   . Diabetes mellitus without complication ([place])    sees Dr. Cruzita Lederer   . Diverticulitis of colon 02/2015  . Ectopic pregnancy   . Emphysema of lung ([place])   . Frozen shoulder   . Heart murmur   . Hernia, abdominal   . Hyperlipidemia   . Hyperthyroidism   . Infertility, female   . Irregular heartbeat   . Joint pain   . Obesity   . Osteoarthritis   . Sleep apnea   . Swallowing difficulty   . Thyroid disease    hyperthyroid   Past Surgical History:  Procedure Laterality Date  . APPENDECTOMY    . CESAREAN SECTION    . COLONOSCOPY  09-10-10   per Dr. Acquanetta Sit, diverticulosis of descending colon and sigmoid, internal hemorrhoids, repeat in 10 yrs  . COLOSTOMY N/A 02/20/2015   Procedure: COLOSTOMY;  Surgeon: Jackolyn Confer, MD;  Location: [facility];  Service: General;  Laterality: N/A;  . COLOSTOMY REVISION  02/20/2015   Procedure: Sigmoid Colectomy;  Surgeon: Jackolyn Confer, MD;  Location: [facility];  Service: General;;  . CYSTOSCOPY N/A 02/20/2015   Procedure: Consuela Mimes;  Surgeon: Kathie Rhodes, MD;  Location: [facility];  Service: Urology;  Laterality: N/A;  . DILATION AND CURETTAGE OF UTERUS     x3  . ILEO LOOP COLOSTOMY CLOSURE N/A 09/26/2015   Procedure: LAPAROSCOPIC LYSIS OF ADHESIONS, COLOSTOMY CLOSURE;  Surgeon: Jackolyn Confer, MD;  Location: [facility];  Service: General;  Laterality: N/A;  . LAPAROTOMY N/A 02/20/2015   Procedure: Emergency EXPLORATORY and drainiage of interabdominal  abcesses;  Surgeon: Jackolyn Confer, MD;  Location: [facility];  Service: General;  Laterality: N/A;  . SALPINGOOPHORECTOMY Left 02/20/2015   Procedure: SALPINGO OOPHORECTOMY;  Surgeon: Jackolyn Confer, MD;  Location: [facility];  Service: General;  Laterality: Left;  . TONSILLECTOMY  1980   Social History   Socioeconomic History  . Marital status: Married    Spouse name: Not on file  . Number of children: 1  . Years of education: Not on file  . Highest education level: Not on file  Occupational History  . Occupation: Equities trader at [employer]: [place]  . Financial resource strain: Not on file  . Food insecurity    Worry: Not on file    Inability: Not on file  . Transportation needs    Medical: Not on file    Non-medical: Not on file  Tobacco Use  . Smoking status: Current Every Day Smoker  Packs/day: 0.25    Years: 40.00    Pack years: 10.00    Types: Cigarettes  . Smokeless tobacco: Never Used  . Tobacco comment: less than 1/2 pack a day  Substance and Sexual Activity  . Alcohol use: Yes    Alcohol/week: 0.0 standard drinks    Comment: rare  . Drug use: No  . Sexual activity: Not Currently    Partners: Male    Comment: 1ST intercourse- 36, partners- 30,  married x 32 yrs   Lifestyle  . Physical activity    Days per week: Not on file    Minutes per session: Not on file  . Stress: Not on file  Relationships  . Social Herbalist on phone: Not on file    Gets together: Not on file    Attends religious service: Not on file    Active member of club or organization: Not on file    Attends meetings of clubs or organizations: Not on file    Relationship status: Not on file  . Intimate partner violence    Fear of current or ex partner: Not on file    Emotionally abused: Not on file    Physically abused: Not on file    Forced sexual activity: Not on file  Other Topics Concern  . Not on file  Social  History Narrative  . Not on file   Current Outpatient Medications on File Prior to Visit  Medication Sig Dispense Refill  . acetaminophen (TYLENOL) 325 MG tablet Take 2 tablets (650 mg total) by mouth every 6 (six) hours as needed for moderate pain or headache.    . albuterol (PROVENTIL HFA;VENTOLIN HFA) 108 (90 Base) MCG/ACT inhaler INHALE 2 PUFFS INTO THE LUNGS EVERY 4 HOURS AS NEEDED FOR WHEEZING 8.5 g 6  . aspirin 81 MG tablet Take 81 mg by mouth daily.    Marland Kitchen atorvastatin (LIPITOR) 20 MG tablet Take 1 tablet (20 mg total) by mouth daily. 90 tablet 3  . Coenzyme Q10 (CO Q 10 PO) Take 1 capsule by mouth daily.    . diclofenac (VOLTAREN) 75 MG EC tablet TAKE 1 TABLET(75 MG) BY MOUTH TWICE DAILY 60 tablet 11  . diltiazem (CARDIZEM CD) 120 MG 24 hr capsule TAKE 1 CAPSULE(120 MG) BY MOUTH DAILY 90 capsule 3  . fluticasone (FLONASE) 50 MCG/ACT nasal spray Place 2 sprays into both nostrils daily. 16 g 6  . fluticasone (FLONASE) 50 MCG/ACT nasal spray Place 2 sprays into both nostrils daily for 10 days. 16 g 0  . furosemide (LASIX) 20 MG tablet TAKE 1 TABLET BY MOUTH DAILY AS NEEDED FOR FLUID RETENTION 30 tablet 2  . glucose blood (CONTOUR NEXT TEST) test strip Use to check blood sugar once a day. 100 each 12  . Icosapent Ethyl (VASCEPA) 1 g CAPS Take 2 capsules (2 g total) by mouth 2 (two) times daily. 360 capsule 3  . metFORMIN (GLUCOPHAGE) 500 MG tablet TAKE 1 TABLET(500 MG) BY MOUTH TWICE DAILY WITH A MEAL 180 tablet 0  . methimazole (TAPAZOLE) 5 MG tablet TAKE 1/2 TABLET( 2.5 MG) BY MOUTH DAILY 135 tablet 0  . metroNIDAZOLE (METROGEL) 1 % gel APPLY EXTERNALLY TO THE AFFECTED AREA DAILY (Patient not taking: Reported on 09/05/2019) 60 g 11  . Microlet Lancets MISC USE TO CHECK BLOOD SUGAR ONCE DAILY 100 each 0  . Multiple Vitamins-Minerals (MULTIVITAMIN WITH MINERALS) tablet Take 1 tablet by mouth daily.    . multivitamin-lutein (OCUVITE-LUTEIN)  CAPS capsule Take 1 capsule by mouth daily.    .  polyethylene glycol powder (GLYCOLAX/MIRALAX) 17 GM/SCOOP powder Take 17 g by mouth daily. (Patient not taking: Reported on 09/05/2019) 3350 g 0  . topiramate (TOPAMAX) 25 MG tablet Take 1 tablet (25 mg total) by mouth 2 (two) times daily. 60 tablet 0  . TURMERIC PO Take 1 tablet by mouth daily.    . Vitamin D, Ergocalciferol, (DRISDOL) 1.25 MG (50000 UT) CAPS capsule Take 1 capsule (50,000 Units total) by mouth every 7 (seven) days. 4 capsule 0   No current facility-administered medications on file prior to visit.    Allergies  Allergen Reactions  . Levofloxacin In D5w Other (See Comments)    Achilles tendon weakness  . Mangifera Indica Anaphylaxis  . Mango Flavor Swelling  . Bupropion Nausea And Vomiting  . Levaquin [Levofloxacin] Other (See Comments)    Severe tendonitis Tolerates Cipro   Family History  Problem Relation Age of Onset  . Breast cancer Mother   . High blood pressure Mother   . Kidney disease Mother   . Sleep apnea Mother   . Obesity Mother   . CAD Father        CABG  . Skin cancer Father   . Lung cancer Father   . Diverticulitis Father   . Colon polyps Father   . High blood pressure Father   . Stroke Father   . Heart disease Father   . Sleep apnea Father   . COPD Other   . Cancer Other   . Heart disease Other   . Stroke Other   . Allergies Brother   . Diabetes Brother   . Breast cancer Maternal Grandmother   . Heart attack Maternal Grandfather   . Heart attack Paternal Grandfather   . Hypertension Neg Hx    PE: LMP 03/22/2012   Wt Readings from Last 3 Encounters:  09/05/19 224 lb (101.6 kg)  08/07/19 223 lb (101.2 kg)  03/23/19 231 lb (104.8 kg)   Constitutional:  in NAD  The physical exam was not performed (virtual visit).  ASSESSMENT: 1. Thyrotoxycosis, likely mild Graves' disease  07/10/2014: Uptake and scan: 24 hr uptake by the thyroid gland is 28.5%. Normal 24 hr uptake is 10-30 %. On thyroid imaging, the thyroid activity appears  mildly heterogeneous in the right lobe with a possible cold nodule inferiorly. The left lobe activity appears homogeneous.  2. Multinodular goiter  07/15/2014: Thyroid U/S:  Right thyroid lobe: 4.5 x 1.9 x 1.9 cm. Multiple nodules are noted throughout the right lobe of the thyroid. There is diffuse irregular margined hypoechoic nodule measuring 16 mm in the upper pole. Multiple smaller nodules are identified.  Left thyroid lobe: 4.4 x 1.7 x 1.9 cm. Multiple hypoechoic nodules are noted within the left lobe of the thyroid. The largest of these measures 15 mm in greatest dimension lying within the lower pole  Isthmus Thickness: 0.5 cm. No nodules visualized.  Lymphadenopathy None visualized.  IMPRESSION: Multiple bilateral thyroid nodules. The dominant nodules bilateral meet criteria for percutaneous biopsy.   10/20/2017: Thyroid U/S: Location: Right; Inferior Maximum size: 1.5 cm; Other 2 dimensions: 1.1 cm x 1.3 cm Composition: solid/almost completely solid (2) Echogenicity: isoechoic (1)  IMPRESSION: Right inferior thyroid nodule (labeled 1) meets criteria for surveillance, as designated by the newly established ACR TI-RADS criteria. Surveillance ultrasound study recommended to be performed annually up to 5 years. Given that the prior ultrasound was performed 2015, this may serve  as the third annual.  10/24/2018: Thyroid U/S: Parenchymal Echotexture: Moderately heterogenous Isthmus: 0.7 cm Right lobe: 5.2 x 2.2 x 2.3 cm Left lobe: 5.5 x 2.5 x 2.0 cm  No discrete nodules are seen within the thyroid gland. Specifically, the 1.5 cm nodule previously seen in the right inferior gland is no longer discernible from the background thyroid parenchyma. It very likely represented a pseudo nodule.  IMPRESSION: 1. The previously identified right inferior thyroid nodule can not be identified on today's examination. It is either involuted over the past year, or was representative of a  pseudo nodule on the prior examination. No further follow-up required. 2. Similar appearance of enlarged and diffusely heterogeneous thyroid gland.  3. Prediabetes   PLAN:  1. Patient with history of subclinical hypothyroidism, initially with few thyrotoxic symptoms: Heat intolerance, palpitations.  Thyroid uptake and scan showed possible mild Graves' disease versus slight thyrotoxic nodules.  Of note, her TSI's were not elevated in the past.  We started methimazole in 05/2015 and she continues on 2.5 mg daily.  No palpitations or new episodes of atrial fibrillation.  Of note, she is also on Cardizem (changed from metoprolol due to dizziness).  In the past she was telling me that she occasionally increase the dose of methimazole to 5 mg daily if she had neck compression symptoms: Fullness in her throat and dysphagia.  However, she did not increase the dose in the last year.  She has no neck compression symptoms. -We will need to recheck her TFTs when she returns to the clinic and change the methimazole dose accordingly. -I will have her come back in 6 months for another visit  2. Multinodular goiter -I reviewed the report of her latest thyroid ultrasound from 10/24/2018: Her thyroid nodules are not visible on the new ultrasound, consistent with the possibility that this could have been pseudonodules (inflammatory nodules) which disappeared after the thyroid inflammation resolved -No neck compression symptoms -No further follow-up is necessary for this  3. Prediabetes -At last visit HbA1c was higher, at 6.2%, increased from 5.7% -We continued Metformin 500 mg twice a day refilled -We will repeat her HbA1c when she returns to the clinic -We will check annual labs along with the HbA1c  Patient Instructions  Please continue: - Metformin 500 mg 2x a day  Targets: - before meals: 80-130 (for you: 80-120) - after meals: <180 (for you: <150)  Continue: - Methimazole 2.5 mg daily  Please  come back for another set of thyroid labs as soon as safe.  Please come back for a follow-up appointment in 6 months.  4. HL - Reviewed latest lipid panel; LDL is at goal, but triglycerides are still high, increased from before Lab Results  Component Value Date   CHOL 313 (H) 03/23/2019   HDL 24.00 (L) 03/23/2019   LDLCALC 93 11/02/2011   LDLDIRECT 64.0 03/23/2019   TRIG (H) 03/23/2019    488.0 Triglyceride is over 400; calculations on Lipids are invalid.   CHOLHDL 13 03/23/2019  -Since last visit she was started on a statin, which she continues without side effects.  She was also started on Vascepa more recently and she takes this twice a day.  However, this causes GERD.  5.  Obesity class III - now on Topamax -She lost 11 pounds in the last 3 months. -We will forward the labs to her weight loss specialist, Dr. Owens Shark (I will also order an insulin level per her request)  Orders Placed This Encounter  Procedures  . xtpit - TSH  . xtpit - free T4  . xtpit - free T3  . Lipid panel  . VITAMIN D 25 Hydroxy (Vit-D Deficiency, Fractures)  . HgB A1c  . COMPLETE METABOLIC PANEL WITH GFR  . Insulin, random   Component     Latest Ref Rng & Units 10/05/2019  Glucose     65 - 99 mg/dL 114 (H)  BUN     7 - 25 mg/dL 15  Creatinine     0.50 - 0.99 mg/dL 0.86  GFR, Est Non African American     > OR = 60 mL/min/1.3m2 72  GFR, Est African American     > OR = 60 mL/min/1.15m2 84  BUN/Creatinine Ratio     6 - 22 (calc) NOT APPLICABLE  Sodium     A999333 - 146 mmol/L 143  Potassium     3.5 - 5.3 mmol/L 4.4  Chloride     98 - 110 mmol/L 109  CO2     20 - 32 mmol/L 23  Calcium     8.6 - 10.4 mg/dL 9.0  Total Protein     6.1 - 8.1 g/dL 6.4  Albumin MSPROF     3.6 - 5.1 g/dL 3.9  Globulin     1.9 - 3.7 g/dL (calc) 2.5  AG Ratio     1.0 - 2.5 (calc) 1.6  Total Bilirubin     0.2 - 1.2 mg/dL 0.4  Alkaline phosphatase (APISO)     37 - 153 U/L 74  AST     10 - 35 U/L 17  ALT      6 - 29 U/L 23  Cholesterol     0 - 200 mg/dL 106  Triglycerides     0.0 - 149.0 mg/dL 211.0 (H)  HDL Cholesterol     >39.00 mg/dL 23.70 (L)  VLDL     0.0 - 40.0 mg/dL 42.2 (H)  Total CHOL/HDL Ratio      4  NonHDL      81.87  TSH     0.35 - 4.50 uIU/mL 2.88  Hemoglobin A1C     4.6 - 6.5 % 6.0  T4,Free(Direct)     0.60 - 1.60 ng/dL 0.82  Triiodothyronine,Free,Serum     2.3 - 4.2 pg/mL 3.0  Direct LDL     mg/dL 29.0  INSULIN     uIU/mL 26.5 (H)  VITD     30.00 - 100.00 ng/mL 59.61   Glucose slightly high, triglycerides high but much improved, LDL at goal.  Vitamin D and TFTs normal, HbA1c improved, insulin level slightly high.  Philemon Kingdom, MD PhD [facility] Endocr[facility]

## 2019-09-29 ENCOUNTER — Ambulatory Visit: Payer: BC Managed Care – PPO | Admitting: Professional

## 2019-10-02 NOTE — Telephone Encounter (Signed)
Can either of you refer to pulmonary for cpap per Dr. Landis Gandy note? She used to see Dr. Gwenette Greet. Gae Bon, I thought you already took care of this? Thanks, Selinda Eon

## 2019-10-03 NOTE — Telephone Encounter (Signed)
Referral has been placed to Pulm.

## 2019-10-05 ENCOUNTER — Other Ambulatory Visit: Payer: Self-pay

## 2019-10-05 ENCOUNTER — Other Ambulatory Visit (INDEPENDENT_AMBULATORY_CARE_PROVIDER_SITE_OTHER): Payer: BC Managed Care – PPO

## 2019-10-05 DIAGNOSIS — E781 Pure hyperglyceridemia: Secondary | ICD-10-CM | POA: Diagnosis not present

## 2019-10-05 DIAGNOSIS — R7303 Prediabetes: Secondary | ICD-10-CM

## 2019-10-05 DIAGNOSIS — E0580 Other thyrotoxicosis without thyrotoxic crisis or storm: Secondary | ICD-10-CM | POA: Diagnosis not present

## 2019-10-05 DIAGNOSIS — Z6841 Body Mass Index (BMI) 40.0 and over, adult: Secondary | ICD-10-CM | POA: Diagnosis not present

## 2019-10-05 LAB — LIPID PANEL
Cholesterol: 106 mg/dL (ref 0–200)
HDL: 23.7 mg/dL — ABNORMAL LOW (ref >39.00)
NonHDL: 81.87
Total CHOL/HDL Ratio: 4
Triglycerides: 211 mg/dL — ABNORMAL HIGH (ref 0.0–149.0)
VLDL: 42.2 mg/dL — ABNORMAL HIGH (ref 0.0–40.0)

## 2019-10-05 LAB — VITAMIN D 25 HYDROXY (VIT D DEFICIENCY, FRACTURES): VITD: 59.61 ng/mL (ref 30.00–100.00)

## 2019-10-05 LAB — HEMOGLOBIN A1C: Hgb A1c MFr Bld: 6 % (ref 4.6–6.5)

## 2019-10-05 LAB — LDL CHOLESTEROL, DIRECT: Direct LDL: 29 mg/dL

## 2019-10-05 LAB — T4, FREE: Free T4: 0.82 ng/dL (ref 0.60–1.60)

## 2019-10-05 LAB — TSH: TSH: 2.88 u[IU]/mL (ref 0.35–4.50)

## 2019-10-05 LAB — T3, FREE: T3, Free: 3 pg/mL (ref 2.3–4.2)

## 2019-10-08 LAB — INSULIN, RANDOM: Insulin: 26.5 u[IU]/mL — ABNORMAL HIGH

## 2019-10-08 LAB — COMPLETE METABOLIC PANEL WITH GFR
AG Ratio: 1.6 (calc) (ref 1.0–2.5)
ALT: 23 U/L (ref 6–29)
AST: 17 U/L (ref 10–35)
Albumin: 3.9 g/dL (ref 3.6–5.1)
Alkaline phosphatase (APISO): 74 U/L (ref 37–153)
BUN: 15 mg/dL (ref 7–25)
CO2: 23 mmol/L (ref 20–32)
Calcium: 9 mg/dL (ref 8.6–10.4)
Chloride: 109 mmol/L (ref 98–110)
Creat: 0.86 mg/dL (ref 0.50–0.99)
GFR, Est African American: 84 mL/min/{1.73_m2} (ref >=60)
GFR, Est Non African American: 72 mL/min/{1.73_m2} (ref >=60)
Globulin: 2.5 g/dL (calc) (ref 1.9–3.7)
Glucose, Bld: 114 mg/dL — ABNORMAL HIGH (ref 65–99)
Potassium: 4.4 mmol/L (ref 3.5–5.3)
Sodium: 143 mmol/L (ref 135–146)
Total Bilirubin: 0.4 mg/dL (ref 0.2–1.2)
Total Protein: 6.4 g/dL (ref 6.1–8.1)

## 2019-10-09 ENCOUNTER — Telehealth (INDEPENDENT_AMBULATORY_CARE_PROVIDER_SITE_OTHER): Payer: BC Managed Care – PPO | Admitting: Bariatrics

## 2019-10-09 ENCOUNTER — Other Ambulatory Visit: Payer: Self-pay

## 2019-10-09 ENCOUNTER — Encounter (INDEPENDENT_AMBULATORY_CARE_PROVIDER_SITE_OTHER): Payer: Self-pay | Admitting: Bariatrics

## 2019-10-09 DIAGNOSIS — G4733 Obstructive sleep apnea (adult) (pediatric): Secondary | ICD-10-CM

## 2019-10-09 DIAGNOSIS — E7849 Other hyperlipidemia: Secondary | ICD-10-CM | POA: Diagnosis not present

## 2019-10-09 DIAGNOSIS — Z6841 Body Mass Index (BMI) 40.0 and over, adult: Secondary | ICD-10-CM | POA: Diagnosis not present

## 2019-10-10 NOTE — Progress Notes (Signed)
Office: [phone]  /  Fax: [phone] TeleHealth Visit:  Alison Best has verbally consented to this TeleHealth visit today. The patient is located in the car, the provider is located at the [employer] [facility] office. The participants in this visit include the listed provider and patient. The visit was conducted today via Webex.  HPI:  Chief Complaint: OBESITY Alison Best is here to discuss her progress with her obesity treatment plan. She is on the Category 2 plan and states she is following her eating plan approximately 50% of the time. She states she is walking 3000-3500 steps a day 7 times per week.  Alison Best states that her weight remains the same (weight 220). Her holiday was good. She is doing well with her meat, fruits and vegetables.  Today's visit was #17 Starting weight: 227 lbs Starting date: 01/11/2019  Hyperlipidemia Alison Best has hyperlipidemia and is taking Lipitor. Triglycerides have decreased and LDL is at goal.  Obstructive Sleep Apnea (OSA) Alison Best has obstructive sleep apnea and will have a sleep study in January. She states she is sleeping okay most nights with restful sleep. She does report increased snoring.  ASSESSMENT AND PLAN:  Other hyperlipidemia  OSA (obstructive sleep apnea)  Class 3 severe obesity due to excess calories with serious comorbidity and body mass index (BMI) of 40.0 to 44.9 in adult [facility])  PLAN:  Hyperlipidemia Intensive lifestyle modifications as the first line treatment for hyperlipidemia. We discussed many lifestyle modifications today and Alison Best will continue her medications, work on diet, exercise and weight loss efforts.  Obstructive Sleep Apnea (OSA) Alison Best will continue using the CPAP. She will have the sleep study per her PCP in January, 2021.  Obesity Alison Best is currently in the action stage of change. As such, her goal is to continue with weight loss efforts. She has agreed to follow the Category 2 plan. Alison Best will work  on meal planning, intentional eating, will weigh herself weekly, plan for simple meals, and will start to use her air fryer. Alison Best has been instructed to exercise and increase her steps for weight loss and overall health benefits. We discussed the following Behavioral Modification Strategies today: increasing lean protein intake, decreasing simple carbohydrates, increasing vegetables, increase H20 intake, decrease eating out, no skipping meals, work on meal planning and easy cooking plans, keeping healthy foods in the home, and planning for success.  Alison Best has agreed to follow-up with our clinic in 3 weeks. She was informed of the importance of frequent follow-up visits to maximize her success with intensive lifestyle modifications for her multiple health conditions.  ALLERGIES: Allergies  Allergen Reactions  . Levofloxacin In D5w Other (See Comments)    Achilles tendon weakness  . Mangifera Indica Anaphylaxis  . Mango Flavor Swelling  . Bupropion Nausea And Vomiting  . Levaquin [Levofloxacin] Other (See Comments)    Severe tendonitis Tolerates Cipro    MEDICATIONS: Current Outpatient Medications on File Prior to Visit  Medication Sig Dispense Refill  . acetaminophen (TYLENOL) 325 MG tablet Take 2 tablets (650 mg total) by mouth every 6 (six) hours as needed for moderate pain or headache.    . albuterol (PROVENTIL HFA;VENTOLIN HFA) 108 (90 Base) MCG/ACT inhaler INHALE 2 PUFFS INTO THE LUNGS EVERY 4 HOURS AS NEEDED FOR WHEEZING 8.5 g 6  . aspirin 81 MG tablet Take 81 mg by mouth daily.    Marland Kitchen atorvastatin (LIPITOR) 20 MG tablet Take 1 tablet (20 mg total) by mouth daily. 90 tablet 3  . Coenzyme  Q10 (CO Q 10 PO) Take 1 capsule by mouth daily.    . diclofenac (VOLTAREN) 75 MG EC tablet TAKE 1 TABLET(75 MG) BY MOUTH TWICE DAILY 60 tablet 11  . diltiazem (CARDIZEM CD) 120 MG 24 hr capsule TAKE 1 CAPSULE(120 MG) BY MOUTH DAILY 90 capsule 3  . fluticasone (FLONASE) 50 MCG/ACT nasal spray Place 2  sprays into both nostrils daily. 16 g 6  . fluticasone (FLONASE) 50 MCG/ACT nasal spray Place 2 sprays into both nostrils daily for 10 days. 16 g 0  . furosemide (LASIX) 20 MG tablet TAKE 1 TABLET BY MOUTH DAILY AS NEEDED FOR FLUID RETENTION 30 tablet 2  . glucose blood (CONTOUR NEXT TEST) test strip Use to check blood sugar once a day. 100 each 12  . Icosapent Ethyl (VASCEPA) 1 g CAPS Take 2 capsules (2 g total) by mouth 2 (two) times daily. 360 capsule 3  . metFORMIN (GLUCOPHAGE) 500 MG tablet TAKE 1 TABLET(500 MG) BY MOUTH TWICE DAILY WITH A MEAL 180 tablet 3  . methimazole (TAPAZOLE) 5 MG tablet TAKE 1/2 TABLET( 2.5 MG) BY MOUTH DAILY 45 tablet 3  . metroNIDAZOLE (METROGEL) 1 % gel APPLY EXTERNALLY TO THE AFFECTED AREA DAILY (Patient not taking: Reported on 09/05/2019) 60 g 11  . Microlet Lancets MISC USE TO CHECK BLOOD SUGAR ONCE DAILY 100 each 0  . Multiple Vitamins-Minerals (MULTIVITAMIN WITH MINERALS) tablet Take 1 tablet by mouth daily.    . multivitamin-lutein (OCUVITE-LUTEIN) CAPS capsule Take 1 capsule by mouth daily.    . polyethylene glycol powder (GLYCOLAX/MIRALAX) 17 GM/SCOOP powder Take 17 g by mouth daily. (Patient not taking: Reported on 09/05/2019) 3350 g 0  . topiramate (TOPAMAX) 25 MG tablet Take 1 tablet (25 mg total) by mouth 2 (two) times daily. 60 tablet 0  . TURMERIC PO Take 1 tablet by mouth daily.    . Vitamin D, Ergocalciferol, (DRISDOL) 1.25 MG (50000 UT) CAPS capsule Take 1 capsule (50,000 Units total) by mouth every 7 (seven) days. 4 capsule 0   No current facility-administered medications on file prior to visit.    PAST MEDICAL HISTORY: Past Medical History:  Diagnosis Date  . Anal fissure   . Arthritis   . Bursitis   . Constipation   . COPD (chronic obstructive pulmonary disease) ([place])   . Diabetes mellitus without complication ([place])    sees Dr. Cruzita Lederer   . Diverticulitis of colon 02/2015  . Ectopic pregnancy   . Emphysema of lung ([place])   . Frozen  shoulder   . Heart murmur   . Hernia, abdominal   . Hyperlipidemia   . Hyperthyroidism   . Infertility, female   . Irregular heartbeat   . Joint pain   . Obesity   . Osteoarthritis   . Sleep apnea   . Swallowing difficulty   . Thyroid disease    hyperthyroid    PAST SURGICAL HISTORY: Past Surgical History:  Procedure Laterality Date  . APPENDECTOMY    . CESAREAN SECTION    . COLONOSCOPY  09-10-10   per Dr. Acquanetta Sit, diverticulosis of descending colon and sigmoid, internal hemorrhoids, repeat in 10 yrs  . COLOSTOMY N/A 02/20/2015   Procedure: COLOSTOMY;  Surgeon: Jackolyn Confer, MD;  Location: [facility];  Service: General;  Laterality: N/A;  . COLOSTOMY REVISION  02/20/2015   Procedure: Sigmoid Colectomy;  Surgeon: Jackolyn Confer, MD;  Location: [facility];  Service: General;;  . CYSTOSCOPY N/A 02/20/2015   Procedure: Consuela Mimes;  Surgeon: Elta Guadeloupe  Karsten Ro, MD;  Location: [facility];  Service: Urology;  Laterality: N/A;  . DILATION AND CURETTAGE OF UTERUS     x3  . ILEO LOOP COLOSTOMY CLOSURE N/A 09/26/2015   Procedure: LAPAROSCOPIC LYSIS OF ADHESIONS, COLOSTOMY CLOSURE;  Surgeon: Jackolyn Confer, MD;  Location: [facility];  Service: General;  Laterality: N/A;  . LAPAROTOMY N/A 02/20/2015   Procedure: Emergency EXPLORATORY and drainiage of interabdominal abcesses;  Surgeon: Jackolyn Confer, MD;  Location: [facility];  Service: General;  Laterality: N/A;  . SALPINGOOPHORECTOMY Left 02/20/2015   Procedure: SALPINGO OOPHORECTOMY;  Surgeon: Jackolyn Confer, MD;  Location: [facility];  Service: General;  Laterality: Left;  . TONSILLECTOMY  1980    SOCIAL HISTORY: Social History   Tobacco Use  . Smoking status: Current Every Day Smoker    Packs/day: 0.25    Years: 40.00    Pack years: 10.00    Types: Cigarettes  . Smokeless tobacco: Never Used  . Tobacco comment: less than 1/2 pack a day  Substance Use Topics  . Alcohol use: Yes    Alcohol/week: 0.0 standard drinks    Comment: rare  . Drug use: No      FAMILY HISTORY: Family History  Problem Relation Age of Onset  . Breast cancer Mother   . High blood pressure Mother   . Kidney disease Mother   . Sleep apnea Mother   . Obesity Mother   . CAD Father        CABG  . Skin cancer Father   . Lung cancer Father   . Diverticulitis Father   . Colon polyps Father   . High blood pressure Father   . Stroke Father   . Heart disease Father   . Sleep apnea Father   . COPD Other   . Cancer Other   . Heart disease Other   . Stroke Other   . Allergies Brother   . Diabetes Brother   . Breast cancer Maternal Grandmother   . Heart attack Maternal Grandfather   . Heart attack Paternal Grandfather   . Hypertension Neg Hx    ROS: Review of Systems  Respiratory:       Positive for obstructive sleep apnea.   PHYSICAL EXAM: Last menstrual period 03/22/2012. There is no height or weight on file to calculate BMI. Physical Exam: Pt in no acute distress.  RECENT LABS AND TESTS: BMET    Component Value Date/Time   NA 143 10/05/2019 0849   NA 142 01/11/2019 1136   K 4.4 10/05/2019 0849   CL 109 10/05/2019 0849   CO2 23 10/05/2019 0849   GLUCOSE 114 (H) 10/05/2019 0849   BUN 15 10/05/2019 0849   BUN 9 01/11/2019 1136   CREATININE 0.86 10/05/2019 0849   CALCIUM 9.0 10/05/2019 0849   GFRNONAA 72 10/05/2019 0849   GFRAA 84 10/05/2019 0849   Lab Results  Component Value Date   HGBA1C 6.0 10/05/2019   HGBA1C 6.2 (A) 03/23/2019   HGBA1C 5.7 (A) 09/20/2018   HGBA1C 5.5 09/30/2017   HGBA1C 6.3 04/01/2017   Lab Results  Component Value Date   INSULIN 21.2 01/11/2019   CBC    Component Value Date/Time   WBC 9.5 11/16/2017 1655   RBC 4.69 11/16/2017 1655   HGB 14.6 11/16/2017 1655   HCT 43.4 11/16/2017 1655   PLT 288.0 11/16/2017 1655   MCV 92.5 11/16/2017 1655   MCH 30.1 10/07/2015 0546   MCHC 33.5 11/16/2017 1655   RDW 15.3 11/16/2017  1655   LYMPHSABS 3.1 11/16/2017 1655   MONOABS 0.6 11/16/2017 1655   EOSABS 0.1  11/16/2017 1655   BASOSABS 0.1 11/16/2017 1655   Iron/TIBC/Ferritin/ %Sat No results found for: IRON, TIBC, FERRITIN, IRONPCTSAT Lipid Panel     Component Value Date/Time   CHOL 106 10/05/2019 0849   TRIG 211.0 (H) 10/05/2019 0849   HDL 23.70 (L) 10/05/2019 0849   CHOLHDL 4 10/05/2019 0849   VLDL 42.2 (H) 10/05/2019 0849   LDLCALC 93 11/02/2011 0947   LDLDIRECT 29.0 10/05/2019 0849   Hepatic Function Panel     Component Value Date/Time   PROT 6.4 10/05/2019 0849   PROT 7.0 01/11/2019 1136   ALBUMIN 4.6 01/11/2019 1136   AST 17 10/05/2019 0849   ALT 23 10/05/2019 0849   ALKPHOS 84 01/11/2019 1136   BILITOT 0.4 10/05/2019 0849   BILITOT 0.3 01/11/2019 1136   BILIDIR 0.1 11/16/2017 1655   IBILI 0.6 02/25/2015 0928      Component Value Date/Time   TSH 2.88 10/05/2019 0849   TSH 2.26 03/23/2019 1005   TSH 2.44 09/20/2018 1118    OBESITY BEHAVIORAL INTERVENTION VISIT DOCUMENTATION FOR INSURANCE (~15 minutes)  I, Michaelene Song, am acting as Location manager for [employer], DO

## 2019-10-12 ENCOUNTER — Ambulatory Visit (INDEPENDENT_AMBULATORY_CARE_PROVIDER_SITE_OTHER): Payer: BC Managed Care – PPO | Admitting: Professional

## 2019-10-12 DIAGNOSIS — F4321 Adjustment disorder with depressed mood: Secondary | ICD-10-CM

## 2019-10-25 ENCOUNTER — Telehealth: Payer: BC Managed Care – PPO | Admitting: Physician Assistant

## 2019-10-25 DIAGNOSIS — J019 Acute sinusitis, unspecified: Secondary | ICD-10-CM

## 2019-10-25 MED ORDER — FLUTICASONE PROPIONATE 50 MCG/ACT NA SUSP: 2.0000 | Freq: Every day | NASAL | 0 refills | Status: DC

## 2019-10-25 MED ORDER — ALBUTEROL SULFATE HFA 108 (90 BASE) MCG/ACT IN AERS: INHALATION_SPRAY | RESPIRATORY_TRACT | 0 refills | Status: DC

## 2019-10-25 MED ORDER — AMOXICILLIN-POT CLAVULANATE 875-125 MG PO TABS: 1.0000 | ORAL_TABLET | Freq: Two times a day (BID) | ORAL | 0 refills | Status: DC

## 2019-10-25 NOTE — Progress Notes (Signed)
We are sorry that you are not feeling well.  Here is how we plan to help!  Based on what you have shared with me it looks like you have sinusitis.  Sinusitis is inflammation and infection in the sinus cavities of the head.  Based on your presentation I believe you most likely have Acute Bacterial Sinusitis.  This is an infection caused by bacteria and is treated with antibiotics. I have prescribed Augmentin 875mg /125mg  one tablet twice daily with food, for 7 days. You may use an oral decongestant such as Mucinex D or if you have glaucoma or high blood pressure use plain Mucinex. Saline nasal spray help and can safely be used as often as needed for congestion.  If you develop worsening sinus pain, fever or notice severe headache and vision changes, or if symptoms are not better after completion of antibiotic, please schedule an appointment with a health care provider.    If you have started to develop bronchitis, Augmentin will treat this as well.  I will be happy to refill your albuterol inhaler.  You may also use Flonase for sinus congestion as this will assist with drainage.  I have sent a prescription of this to your pharmacy in case she needed a refill as I see you have been prescribed this in the past.  Sinus infections are not as easily transmitted as other respiratory infection, however we still recommend that you avoid close contact with loved ones, especially the very young and elderly.  Remember to wash your hands thoroughly throughout the day as this is the number one way to prevent the spread of infection!  Home Care:  Only take medications as instructed by your medical team.  Complete the entire course of an antibiotic.  Do not take these medications with alcohol.  A steam or ultrasonic humidifier can help congestion.  You can place a towel over your head and breathe in the steam from hot water coming from a faucet.  Avoid close contacts especially the very young and the  elderly.  Cover your mouth when you cough or sneeze.  Always remember to wash your hands.  Get Help Right Away If:  You develop worsening fever or sinus pain.  You develop a severe head ache or visual changes.  Your symptoms persist after you have completed your treatment plan.  Make sure you  Understand these instructions.  Will watch your condition.  Will get help right away if you are not doing well or get worse.  Your e-visit answers were reviewed by a board certified advanced clinical practitioner to complete your personal care plan.  Depending on the condition, your plan could have included both over the counter or prescription medications.  If there is a problem please reply  once you have received a response from your provider.  Your safety is important to [name].  If you have drug allergies check your prescription carefully.    You can use MyChart to ask questions about today's visit, request a non-urgent call back, or ask for a work or school excuse for 24 hours related to this e-Visit. If it has been greater than 24 hours you will need to follow up with your provider, or enter a new e-Visit to address those concerns.  You will get an e-mail in the next two days asking about your experience.  I hope that your e-visit has been valuable and will speed your recovery. Thank you for using e-visits.  Greater than 5 minutes, yet less  than 10 minutes of time have been spent researching, coordinating, and implementing care for this patient today

## 2019-10-30 ENCOUNTER — Encounter (INDEPENDENT_AMBULATORY_CARE_PROVIDER_SITE_OTHER): Payer: Self-pay | Admitting: Bariatrics

## 2019-10-30 ENCOUNTER — Other Ambulatory Visit: Payer: Self-pay

## 2019-10-30 ENCOUNTER — Ambulatory Visit (INDEPENDENT_AMBULATORY_CARE_PROVIDER_SITE_OTHER): Payer: BC Managed Care – PPO | Admitting: Bariatrics

## 2019-10-30 DIAGNOSIS — Z6841 Body Mass Index (BMI) 40.0 and over, adult: Secondary | ICD-10-CM

## 2019-10-30 DIAGNOSIS — F3289 Other specified depressive episodes: Secondary | ICD-10-CM | POA: Diagnosis not present

## 2019-10-30 DIAGNOSIS — E559 Vitamin D deficiency, unspecified: Secondary | ICD-10-CM | POA: Diagnosis not present

## 2019-10-30 MED ORDER — VITAMIN D (ERGOCALCIFEROL) 1.25 MG (50000 UNIT) PO CAPS: 50000.0000 [IU] | ORAL_CAPSULE | ORAL | 0 refills | Status: DC

## 2019-10-30 MED ORDER — TOPIRAMATE 25 MG PO TABS: 25.0000 mg | ORAL_TABLET | Freq: Two times a day (BID) | ORAL | 0 refills | Status: DC

## 2019-10-31 ENCOUNTER — Institutional Professional Consult (permissible substitution): Payer: BC Managed Care – PPO | Admitting: Pulmonary Disease

## 2019-10-31 NOTE — Progress Notes (Signed)
TeleHealth Visit:  Due to the COVID-19 pandemic, this visit was completed with telemedicine (audio/video) technology to reduce patient and provider exposure as well as to preserve personal protective equipment.   Alison Best has verbally consented to this TeleHealth visit. The patient is located at home, the provider is located at the [employer] [facility] office. The participants in this visit include the listed provider and patient. The visit was conducted today via Webex.  Chief Complaint: OBESITY Alison Best is here to discuss her progress with her obesity treatment plan along with follow-up of her obesity related diagnoses. Alison Best is on the Category 2 Plan and states she is following her eating plan approximately 25% of the time. Alison Best states she is exercising 0 minutes 0 times per week.  Today's visit was #: 18 Starting weight: 227 lbs Starting date: 01/11/2019  Interim History: Alison Best has had a sinus infection, but states is getting better. She states that she is up 1 lb (weight 221) and reports she is back on the "wagon."  Subjective:   Vitamin D deficiency. No nausea, vomiting, or muscle weakness.  Other depression - With emotional eating. No suicidal or homicidal ideations.   Assessment/Plan:   Vitamin D deficiency. Vitamin D, Ergocalciferol, (DRISDOL) 1.25 MG (50000 UT) CAPS capsule 1 capsule every 7 days #4 with 0 refills was prescribed.  Other depression - With emotional eating. Topiramate (TOPAMAX) 25 MG tablet 1 PO BID #60 with 0 refills was prescribed. She will continue to meet with her therapist.  Class 3 severe obesity due to excess calories with serious comorbidity and body mass index (BMI) of 40.0 to 44.9 in adult Alison Best)   Alison Best is currently in the action stage of change. As such, her goal is to continue with weight loss efforts. She has agreed to Category 2 Plan. She will work on meal planning, intentional eating, will be more adherent  to the plan, and will use her [employer].  We discussed the following exercise goals today: For substantial health benefits, adults should do at least 150 minutes (2 hours and 30 minutes) a week of moderate-intensity, or 75 minutes (1 hour and 15 minutes) a week of vigorous-intensity aerobic physical activity, or an equivalent combination of moderate- and vigorous-intensity aerobic activity. Aerobic activity should be performed in episodes of at least 10 minutes, and preferably, it should be spread throughout the week. Adults should also include muscle-strengthening activities that involve all major muscle groups on 2 or more days a week.  We discussed the following behavioral modification strategies today: increasing lean protein intake, decreasing simple carbohydrates, increasing vegetables, increasing water intake, decreasing eating out, no skipping meals, meal planning and cooking strategies, keeping healthy foods in the home and planning for success.  Alison Cleveland Dupre has agreed to follow-up with our clinic in 2-3 weeks. She was informed of the importance of frequent follow-up visits to maximize her success with intensive lifestyle modifications for her multiple health conditions.  Objective:   VITALS: Per patient if applicable, see vitals. GENERAL: Alert and in no acute distress. CARDIOPULMONARY: No increased WOB. Speaking in clear sentences.  PSYCH: Pleasant and cooperative. Speech normal rate and rhythm. Affect is appropriate. Insight and judgement are appropriate. Attention is focused, linear, and appropriate.  NEURO: Oriented as arrived to appointment on time with no prompting.   Lab Results  Component Value Date   CREATININE 0.86 10/05/2019   BUN 15 10/05/2019   NA 143 10/05/2019   K  4.4 10/05/2019   CL 109 10/05/2019   CO2 23 10/05/2019   Lab Results  Component Value Date   ALT 23 10/05/2019   AST 17 10/05/2019   ALKPHOS 84 01/11/2019   BILITOT 0.4 10/05/2019    Lab Results  Component Value Date   HGBA1C 6.0 10/05/2019   HGBA1C 6.2 (A) 03/23/2019   HGBA1C 5.7 (A) 09/20/2018   HGBA1C 5.5 09/30/2017   HGBA1C 6.3 04/01/2017   Lab Results  Component Value Date   INSULIN 21.2 01/11/2019   Lab Results  Component Value Date   TSH 2.88 10/05/2019   Lab Results  Component Value Date   CHOL 106 10/05/2019   HDL 23.70 (L) 10/05/2019   LDLCALC 93 11/02/2011   LDLDIRECT 29.0 10/05/2019   TRIG 211.0 (H) 10/05/2019   CHOLHDL 4 10/05/2019   Lab Results  Component Value Date   WBC 9.5 11/16/2017   HGB 14.6 11/16/2017   HCT 43.4 11/16/2017   MCV 92.5 11/16/2017   PLT 288.0 11/16/2017   No results found for: IRON, TIBC, FERRITIN Attestation Statements:   Reviewed by clinician on day of visit: allergies, medications, problem list, medical history, surgical history, family history, social history and previous encounter notes.  TIME SPENT: 20 minutes   I, Michaelene Song, am acting as Location manager for [employer], DO  I have reviewed the above documentation for accuracy and completeness, and I agree with the above. Jearld Lesch, DO

## 2019-11-02 ENCOUNTER — Other Ambulatory Visit: Payer: BC Managed Care – PPO

## 2019-11-03 ENCOUNTER — Ambulatory Visit (INDEPENDENT_AMBULATORY_CARE_PROVIDER_SITE_OTHER): Payer: BC Managed Care – PPO | Admitting: Professional

## 2019-11-03 ENCOUNTER — Encounter (INDEPENDENT_AMBULATORY_CARE_PROVIDER_SITE_OTHER): Payer: Self-pay | Admitting: Bariatrics

## 2019-11-03 DIAGNOSIS — F4321 Adjustment disorder with depressed mood: Secondary | ICD-10-CM

## 2019-11-04 ENCOUNTER — Ambulatory Visit: Payer: BC Managed Care – PPO | Attending: Internal Medicine

## 2019-11-04 DIAGNOSIS — Z23 Encounter for immunization: Secondary | ICD-10-CM | POA: Insufficient documentation

## 2019-11-04 NOTE — Progress Notes (Signed)
   Covid-19 Vaccination Clinic  Name:  Alison Best [facility]    MRN: [id number] DOB: September 12, 1956  11/04/2019  Alison Best was observed post Covid-19 immunization for 15 minutes without incidence. She was provided with Vaccine Information Sheet and instruction to access the V-Safe system.   Alison Best was instructed to call 911 with any severe reactions post vaccine: Marland Kitchen Difficulty breathing  . Swelling of your face and throat  . A fast heartbeat  . A bad rash all over your body  . Dizziness and weakness    Immunizations Administered    Name Date Dose VIS Date Route   Pfizer COVID-19 Vaccine 11/04/2019 12:58 PM 0.3 mL 10/05/2019 Intramuscular   Manufacturer: [place]   Lot: [id number]   [place]: [id number]

## 2019-11-13 ENCOUNTER — Telehealth (INDEPENDENT_AMBULATORY_CARE_PROVIDER_SITE_OTHER): Payer: BC Managed Care – PPO | Admitting: Bariatrics

## 2019-11-17 ENCOUNTER — Ambulatory Visit: Payer: BC Managed Care – PPO | Admitting: Professional

## 2019-11-19 ENCOUNTER — Other Ambulatory Visit: Payer: Self-pay

## 2019-11-19 ENCOUNTER — Telehealth (INDEPENDENT_AMBULATORY_CARE_PROVIDER_SITE_OTHER): Payer: BC Managed Care – PPO | Admitting: Family Medicine

## 2019-11-19 DIAGNOSIS — R1032 Left lower quadrant pain: Secondary | ICD-10-CM

## 2019-11-19 MED ORDER — METRONIDAZOLE 500 MG PO TABS: 500.0000 mg | ORAL_TABLET | Freq: Two times a day (BID) | ORAL | 0 refills | Status: DC

## 2019-11-19 MED ORDER — AMOXICILLIN-POT CLAVULANATE 875-125 MG PO TABS: 1.0000 | ORAL_TABLET | Freq: Two times a day (BID) | ORAL | 0 refills | Status: DC

## 2019-11-19 NOTE — Progress Notes (Signed)
Virtual Visit via Telephone Note  I connected with the patient on 11/19/19 at 10:45 AM EST by telephone and verified that I am speaking with the correct person using two identifiers.   I discussed the limitations, risks, security and privacy concerns of performing an evaluation and management service by telephone and the availability of in person appointments. I also discussed with the patient that there may be a patient responsible charge related to this service. The patient expressed understanding and agreed to proceed.  Location patient: home Location provider: work or home office Participants present for the call: patient, provider Patient did not have a visit in the prior 7 days to address this/these issue(s).   History of Present Illness: Here for apparent diverticulitis. In 2016 she had a severe bout of sigmoid diverticulitis, and wound up with a sigmoid colectomy. Since then she has done well. She avoids seeds and nuts in her diet, and she gets plenty of fiber and water in her diet to keep the stools soft. However she ate some almonds last week and she developed some constipation. She had LLQ pains and she had to strain to have a BM. She took a stool softener and was able to pass a large hard stool 2 nights ago. Since then she has had diarrhea and a low grade fever to 99.4 degrees. No blood has been seen in the stools.    Observations/Objective: Patient sounds cheerful and well on the phone. I do not appreciate any SOB. Speech and thought processing are grossly intact. Patient reported vitals:  Assessment and Plan: Likely diverticulitis. Treat with 10 days of Augmentin and Flagyl. She will follow up in person if not better in a few days.  Alysia Penna, MD   Follow Up Instructions:     [phone] 11-20 [phone] 21-30 I did not refer this patient for an OV in the next 24 hours for this/these issue(s).  I discussed the assessment and treatment plan with the patient. The  patient was provided an opportunity to ask questions and all were answered. The patient agreed with the plan and demonstrated an understanding of the instructions.   The patient was advised to call back or seek an in-person evaluation if the symptoms worsen or if the condition fails to improve as anticipated.  I provided 17 minutes of non-face-to-face time during this encounter.   Alysia Penna, MD

## 2019-11-24 ENCOUNTER — Ambulatory Visit (INDEPENDENT_AMBULATORY_CARE_PROVIDER_SITE_OTHER): Payer: BC Managed Care – PPO | Admitting: Professional

## 2019-11-24 DIAGNOSIS — F4321 Adjustment disorder with depressed mood: Secondary | ICD-10-CM | POA: Diagnosis not present

## 2019-11-25 ENCOUNTER — Other Ambulatory Visit: Payer: Self-pay

## 2019-11-25 ENCOUNTER — Ambulatory Visit: Payer: BC Managed Care – PPO | Attending: Internal Medicine

## 2019-11-25 DIAGNOSIS — Z23 Encounter for immunization: Secondary | ICD-10-CM | POA: Insufficient documentation

## 2019-11-25 NOTE — Progress Notes (Signed)
   Covid-19 Vaccination Clinic  Name:  Alison Best [facility]    MRN: [id number] DOB: 02/01/1956  11/25/2019  Alison Best was observed post Covid-19 immunization for 15 minutes without incidence. She was provided with Vaccine Information Sheet and instruction to access the V-Safe system.   Alison Best was instructed to call 911 with any severe reactions post vaccine: Marland Kitchen Difficulty breathing  . Swelling of your face and throat  . A fast heartbeat  . A bad rash all over your body  . Dizziness and weakness    Immunizations Administered    Name Date Dose VIS Date Route   Pfizer COVID-19 Vaccine 11/25/2019 10:01 AM 0.3 mL 10/05/2019 Intramuscular   Manufacturer: [place]   Lot: [id number]   [place]: [id number]

## 2019-11-27 ENCOUNTER — Telehealth (INDEPENDENT_AMBULATORY_CARE_PROVIDER_SITE_OTHER): Payer: BC Managed Care – PPO | Admitting: Bariatrics

## 2019-11-27 ENCOUNTER — Other Ambulatory Visit: Payer: Self-pay

## 2019-11-27 ENCOUNTER — Encounter (INDEPENDENT_AMBULATORY_CARE_PROVIDER_SITE_OTHER): Payer: Self-pay | Admitting: Bariatrics

## 2019-11-27 DIAGNOSIS — F3289 Other specified depressive episodes: Secondary | ICD-10-CM

## 2019-11-27 DIAGNOSIS — Z6841 Body Mass Index (BMI) 40.0 and over, adult: Secondary | ICD-10-CM

## 2019-11-27 DIAGNOSIS — E559 Vitamin D deficiency, unspecified: Secondary | ICD-10-CM | POA: Diagnosis not present

## 2019-11-27 MED ORDER — VITAMIN D (ERGOCALCIFEROL) 1.25 MG (50000 UNIT) PO CAPS: 50000.0000 [IU] | ORAL_CAPSULE | ORAL | 0 refills | Status: DC

## 2019-11-27 MED ORDER — TOPIRAMATE 25 MG PO TABS: 25.0000 mg | ORAL_TABLET | Freq: Two times a day (BID) | ORAL | 0 refills | Status: DC

## 2019-11-27 NOTE — Progress Notes (Signed)
TeleHealth Visit:  Due to the COVID-19 pandemic, this visit was completed with telemedicine (audio/video) technology to reduce patient and provider exposure as well as to preserve personal protective equipment.   Alison Best has verbally consented to this TeleHealth visit. The patient is located at home, the provider is located at the [facility] office. The participants in this visit include the listed provider and patient. The visit was conducted today via Webex.  Chief Complaint: OBESITY Alison Best is here to discuss her progress with her obesity treatment plan along with follow-up of her obesity related diagnoses. Alison Best is on the Category 2 Plan and states she is following her eating plan approximately 20% of the time. Alison Best states she is exercising 0 minutes 0 times per week.  Today's visit was #: 19 Starting weight: 227 lbs Starting date: 01/11/2019  Interim History: Alison Best is unsure if she has lost or gained (weight 218). She is down 3 lbs. She did have an episode of diverticulitis and believes it was related to almonds and red meat. She continues to drink adequate water.  Subjective:   Vitamin D deficiency. No nausea, vomiting, or muscle weakness. Last Vitamin D level 27.3 on 01/11/2019.   Other depression - With emotional eating. Alison Best is struggling with emotional eating and using food for comfort to the extent that it is negatively impacting her health. She has been working on behavior modification techniques to help reduce her emotional eating and has been somewhat successful. She shows no sign of suicidal or homicidal ideations.  Assessment/Plan:   Vitamin D deficiency. Low Vitamin D level contributes to fatigue and are associated with obesity, breast, and colon cancer. She was given a prescription for Vitamin D, Ergocalciferol, (DRISDOL) 1.25 MG (50000 UNIT) CAPS capsule every week #4 with 0 refills and will follow-up for routine testing of Vitamin D, at  least 2-3 times per year to avoid over-replacement.   Other depression - With emotional eating. Behavior modification techniques were discussed today to help Alison Best deal with her emotional/non-hunger eating behaviors.  Orders and follow up as documented in patient record. She was given a prescription for topiramate (TOPAMAX) 25 MG tablet 1 PO BID #60 with 0 refills. She will work on portion control.  Class 3 severe obesity with serious comorbidity and body mass index (BMI) of 40.0 to 44.9 in adult, unspecified obesity type (Alison Best).  Alison Best is currently in the action stage of change. As such, her goal is to continue with weight loss efforts. She has agreed to the Category 2 Plan.   She will work on meal planning, intentional eating, and will resume her plan and be more adherent.  Exercise goals: Alison Best will resume walking and wear her Fit Bit.  Behavioral modification strategies: increasing lean protein intake, decreasing simple carbohydrates, increasing vegetables, increasing water intake, decreasing eating out, no skipping meals, meal planning and cooking strategies, keeping healthy foods in the home and planning for success.  Alison Best has agreed to follow-up with our clinic in 2 weeks. She was informed of the importance of frequent follow-up visits to maximize her success with intensive lifestyle modifications for her multiple health conditions.  Objective:   VITALS: Per patient if applicable, see vitals. GENERAL: Alert and in no acute distress. CARDIOPULMONARY: No increased WOB. Speaking in clear sentences.  PSYCH: Pleasant and cooperative. Speech normal rate and rhythm. Affect is appropriate. Insight and judgement are appropriate. Attention is focused, linear, and appropriate.  NEURO: Oriented as arrived to appointment on  time with no prompting.   Lab Results  Component Value Date   CREATININE 0.86 10/05/2019   BUN 15 10/05/2019   NA 143 10/05/2019   K 4.4 10/05/2019   CL 109 10/05/2019   CO2  23 10/05/2019   Lab Results  Component Value Date   ALT 23 10/05/2019   AST 17 10/05/2019   ALKPHOS 84 01/11/2019   BILITOT 0.4 10/05/2019   Lab Results  Component Value Date   HGBA1C 6.0 10/05/2019   HGBA1C 6.2 (A) 03/23/2019   HGBA1C 5.7 (A) 09/20/2018   HGBA1C 5.5 09/30/2017   HGBA1C 6.3 04/01/2017   Lab Results  Component Value Date   INSULIN 21.2 01/11/2019   Lab Results  Component Value Date   TSH 2.88 10/05/2019   Lab Results  Component Value Date   CHOL 106 10/05/2019   HDL 23.70 (L) 10/05/2019   LDLCALC 93 11/02/2011   LDLDIRECT 29.0 10/05/2019   TRIG 211.0 (H) 10/05/2019   CHOLHDL 4 10/05/2019   Lab Results  Component Value Date   WBC 9.5 11/16/2017   HGB 14.6 11/16/2017   HCT 43.4 11/16/2017   MCV 92.5 11/16/2017   PLT 288.0 11/16/2017   No results found for: IRON, TIBC, FERRITIN  Attestation Statements:   Reviewed by clinician on day of visit: allergies, medications, problem list, medical history, surgical history, family history, social history, and previous encounter notes.  Migdalia Dk, am acting as Location manager for [employer], DO   I have reviewed the above documentation for accuracy and completeness, and I agree with the above. Jearld Lesch, DO

## 2019-12-01 ENCOUNTER — Ambulatory Visit: Payer: BC Managed Care – PPO | Admitting: Professional

## 2019-12-03 ENCOUNTER — Telehealth: Payer: Self-pay | Admitting: Cardiovascular Disease

## 2019-12-03 ENCOUNTER — Encounter: Payer: Self-pay | Admitting: *Deleted

## 2019-12-03 DIAGNOSIS — R002 Palpitations: Secondary | ICD-10-CM

## 2019-12-03 NOTE — Telephone Encounter (Signed)
Called and made patient aware of recommendations.    [employer] Cardiac Event Monitor Instructions Your physician has requested you wear your cardiac event monitor for 30 days, (1-30). [employer] may call or text to confirm a shipping address. The monitor will be sent to a land address via UPS. [employer] will not ship a monitor to a PO BOX. It typically takes 3-5 days to receive your monitor after it has been enrolled. [employer] will assist with USPS tracking if your package is delayed. The telephone number for [employer] is [phone]. Once you have received your monitor, please review the enclosed instructions. Instruction tutorials can also be viewed under help and settings on the enclosed cell phone. Your monitor has already been registered assigning a specific monitor serial # to you.  Applying the monitor Remove cell phone from case and turn it on. The cell phone works as Dealer and needs to be within [employer] of you at all times. The cell phone will need to be charged on a daily basis. We recommend you plug the cell phone into the enclosed charger at your bedside table every night.  Monitor batteries: You will receive two monitor batteries labelled #1 and #2. These are your recorders. Plug battery #2 onto the second connection on the enclosed charger. Keep one battery on the charger at all times. This will keep the monitor battery deactivated. It will also keep it fully charged for when you need to switch your monitor batteries. A small light will be blinking on the battery emblem when it is charging. The light on the battery emblem will remain on when the battery is fully charged.  Open package of a Monitor strip. Insert battery #1 into black hood on strip and gently squeeze monitor battery onto connection as indicated in instruction booklet. Set aside while preparing skin.  Choose location for your strip, vertical or horizontal, as indicated in the instruction  booklet. Shave to remove all hair from location. There cannot be any lotions, oils, powders, or colognes on skin where monitor is to be applied. Wipe skin clean with enclosed Saline wipe. Dry skin completely.  Peel paper labeled #1 off the back of the Monitor strip exposing the adhesive. Place the monitor on the chest in the vertical or horizontal position shown in the instruction booklet. One arrow on the monitor strip must be pointing upward. Carefully remove paper labeled #2, attaching remainder of strip to your skin. Try not to create any folds or wrinkles in the strip as you apply it.  Firmly press and release the circle in the center of the monitor battery. You will hear a small beep. This is turning the monitor battery on. The heart emblem on the monitor battery will light up every 5 seconds if the monitor battery in turned on and connected to the patient securely. Do not push and hold the circle down as this turns the monitor battery off. The cell phone will locate the monitor battery. A screen will appear on the cell phone checking the connection of your monitor strip. This may read poor connection initially but change to good connection within the next minute. Once your monitor accepts the connection you will hear a series of 3 beeps followed by a climbing crescendo of beeps. A screen will appear on the cell phone showing the two monitor strip placement options. Touch the picture that demonstrates where you applied the monitor strip.  Your monitor strip and battery are waterproof. You are able to shower, bathe,  or swim with the monitor on. They just ask you do not submerge deeper than 3 feet underwater. We recommend removing the monitor if you are swimming in a lake, river, or ocean.  Your monitor battery will need to be switched to a fully charged monitor battery approximately once a week. The cell phone will alert you of an action which needs to be made.  On the cell phone, tap  for details to reveal connection status, monitor battery status, and cell phone battery status. The green dots indicates your monitor is in good status. A red dot indicates there is something that needs your attention.  To record a symptom, click the circle on the monitor battery. In 30-60 seconds a list of symptoms will appear on the cell phone. Select your symptom and tap save. Your monitor will record a sustained or significant arrhythmia regardless of you clicking the button. Some patients do not feel the heart rhythm irregularities. [employer] will notify us of any serious or critical events.  Refer to instruction booklet for instructions on switching batteries, changing strips, the Do not disturb or Pause features, or any additional questions.  Call [employer] at [phone], to confirm your monitor is transmitting and record your baseline. They will answer any questions you may have regarding the monitor instructions at that time.  Returning the monitor to [place] all equipment back into blue box. Peel off strip of paper to expose adhesive and close box securely. There is a prepaid UPS shipping label on this box. Drop in a UPS drop box, or at a UPS facility like [employer]. You may also contact [employer] to arrange UPS to pick up monitor package at your home.

## 2019-12-03 NOTE — Telephone Encounter (Signed)
Left message to call back  

## 2019-12-03 NOTE — Telephone Encounter (Signed)
Patient called back returning Brittany's call 

## 2019-12-03 NOTE — Telephone Encounter (Signed)
New Message  Patient c/o Palpitations:  High priority if patient c/o lightheadedness, shortness of breath, or chest pain  1) How long have you had palpitations/irregular HR/ Afib? Are you having the symptoms now? Around a week; having flutters now; took 2nd dosage of vaccine November 24, 2018  2) Are you currently experiencing lightheadedness, SOB or CP? No  3) Do you have a history of afib (atrial fibrillation) or irregular heart rhythm? Yes  4) Have you checked your BP or HR? (document readings if available): No  5) Are you experiencing any other symptoms? No

## 2019-12-03 NOTE — Progress Notes (Signed)
Patient ID: Alison Best [facility], female   DOB: 1956/02/18, 64 y.o.   MRN: [id number] Patient enrolled for [employer] to ship a 30 day cardiace event monitor to her home.

## 2019-12-03 NOTE — Telephone Encounter (Signed)
Returned call to patient. She states that she has had intermittent episodes of irregular beats or flutters for the past few weeks. She denies chest pain, SOB, lightheadedness, dizziness, or any other Sx. Does not have BP or pulse readings available. Patient has been taking diltiazem 120 mg QD. Denies alcohol use. States that she drinks 3 cups of tea a day. Instructed patient to decrease caffeine intake. She states that she had some issues with diverticulitis in the past couple of weeks. Will forward message to Ermalinda Barrios, PA to see if she would like to order a monitor.

## 2019-12-03 NOTE — Telephone Encounter (Signed)
Agree with decrease caffeine and smoking. Please send 30 day monitor.

## 2019-12-10 ENCOUNTER — Ambulatory Visit (INDEPENDENT_AMBULATORY_CARE_PROVIDER_SITE_OTHER): Payer: BC Managed Care – PPO

## 2019-12-10 DIAGNOSIS — R002 Palpitations: Secondary | ICD-10-CM

## 2019-12-11 ENCOUNTER — Telehealth (INDEPENDENT_AMBULATORY_CARE_PROVIDER_SITE_OTHER): Payer: BC Managed Care – PPO | Admitting: Bariatrics

## 2019-12-11 ENCOUNTER — Encounter (INDEPENDENT_AMBULATORY_CARE_PROVIDER_SITE_OTHER): Payer: Self-pay | Admitting: Bariatrics

## 2019-12-11 ENCOUNTER — Other Ambulatory Visit: Payer: Self-pay

## 2019-12-11 DIAGNOSIS — E7849 Other hyperlipidemia: Secondary | ICD-10-CM

## 2019-12-11 DIAGNOSIS — Z6841 Body Mass Index (BMI) 40.0 and over, adult: Secondary | ICD-10-CM

## 2019-12-11 DIAGNOSIS — F3289 Other specified depressive episodes: Secondary | ICD-10-CM | POA: Diagnosis not present

## 2019-12-12 NOTE — Progress Notes (Signed)
TeleHealth Visit:  Due to the COVID-19 pandemic, this visit was completed with telemedicine (audio/video) technology to reduce patient and provider exposure as well as to preserve personal protective equipment.   Alison Best has verbally consented to this TeleHealth visit. The patient is located at home, the provider is located at the [facility] office. The participants in this visit include the listed provider and patient. The visit was conducted today via Webex.  Chief Complaint: OBESITY Alison Best is here to discuss her progress with her obesity treatment plan along with follow-up of her obesity related diagnoses. Alison Best is on the Category 2 Plan and states she is following her eating plan approximately 50% of the time. Alison Best states she is exercising 0 minutes 0 times per week.  Today's visit was #: 20 Starting weight: 227 lbs Starting date: 01/11/2019  Interim History: Alison Best states that she has gained 1/2 lb (weight 219). She also states that she has had some palpitations and stopped smoking for the past 8 days. The palpitations have gone away. She spoke with cardiologist (monitor for 30 days).  Subjective:   Other hyperlipidemia. Alison Best is taking Vascepa and Lipitor. Triglycerides are elevated.   Lab Results  Component Value Date   CHOL 106 10/05/2019   HDL 23.70 (L) 10/05/2019   LDLCALC 93 11/02/2011   LDLDIRECT 29.0 10/05/2019   TRIG 211.0 (H) 10/05/2019   CHOLHDL 4 10/05/2019   Lab Results  Component Value Date   ALT 23 10/05/2019   AST 17 10/05/2019   ALKPHOS 84 01/11/2019   BILITOT 0.4 10/05/2019   The ASCVD Risk score Alison Best., et al., 2013) failed to calculate for the following reasons:   The valid total cholesterol range is 130 to 320 mg/dL  Other depression, with emotional eating. Alison Best is struggling with emotional eating and using food for comfort to the extent that it is negatively impacting her health. She has been working on behavior  modification techniques to help reduce her emotional eating and has been somewhat successful. She shows no sign of suicidal or homicidal ideations. Alison Best is taking Topamax.  Assessment/Plan:   Other hyperlipidemia. Cardiovascular risk and specific lipid/LDL goals reviewed.  We discussed several lifestyle modifications today and Alison Best will continue to work on diet, exercise and weight loss efforts. Orders and follow up as documented in patient record. Alison Best will continue her medications as directed. She will decrease carbohydrates and increase healthy fats.  Counseling Intensive lifestyle modifications are the first line treatment for this issue. . Dietary changes: Increase soluble fiber. Decrease simple carbohydrates. . Exercise changes: Moderate to vigorous-intensity aerobic activity 150 minutes per week if tolerated. . Lipid-lowering medications: see documented in medical record.  Other depression, with emotional eating. Behavior modification techniques were discussed today to help Alison Best deal with her emotional/non-hunger eating behaviors.  Orders and follow up as documented in patient record. She will continue Topamax as directed.  Class 3 severe obesity with serious comorbidity and body mass index (BMI) of 40.0 to 44.9 in adult, unspecified obesity type ([place]).  Alison Best is currently in the action stage of change. As such, her goal is to continue with weight loss efforts. She has agreed to the Category 2 Plan.   She will work on meal planning, mindful eating, and will be more adherent to the plan.  Exercise goals: All adults should avoid inactivity. Some physical activity is better than none, and adults who participate in any amount of physical activity gain some health  benefits.  Behavioral modification strategies: increasing lean protein intake, decreasing simple carbohydrates, increasing vegetables, increasing water intake, decreasing eating out, no skipping meals, meal planning and cooking  strategies, keeping healthy foods in the home and planning for success.  Alison Best has agreed to follow-up with our clinic in 2 weeks. She was informed of the importance of frequent follow-up visits to maximize her success with intensive lifestyle modifications for her multiple health conditions.  Objective:   VITALS: Per patient if applicable, see vitals. GENERAL: Alert and in no acute distress. CARDIOPULMONARY: No increased WOB. Speaking in clear sentences.  PSYCH: Pleasant and cooperative. Speech normal rate and rhythm. Affect is appropriate. Insight and judgement are appropriate. Attention is focused, linear, and appropriate.  NEURO: Oriented as arrived to appointment on time with no prompting.   Lab Results  Component Value Date   CREATININE 0.86 10/05/2019   BUN 15 10/05/2019   NA 143 10/05/2019   K 4.4 10/05/2019   CL 109 10/05/2019   CO2 23 10/05/2019   Lab Results  Component Value Date   ALT 23 10/05/2019   AST 17 10/05/2019   ALKPHOS 84 01/11/2019   BILITOT 0.4 10/05/2019   Lab Results  Component Value Date   HGBA1C 6.0 10/05/2019   HGBA1C 6.2 (A) 03/23/2019   HGBA1C 5.7 (A) 09/20/2018   HGBA1C 5.5 09/30/2017   HGBA1C 6.3 04/01/2017   Lab Results  Component Value Date   INSULIN 21.2 01/11/2019   Lab Results  Component Value Date   TSH 2.88 10/05/2019   Lab Results  Component Value Date   CHOL 106 10/05/2019   HDL 23.70 (L) 10/05/2019   LDLCALC 93 11/02/2011   LDLDIRECT 29.0 10/05/2019   TRIG 211.0 (H) 10/05/2019   CHOLHDL 4 10/05/2019   Lab Results  Component Value Date   WBC 9.5 11/16/2017   HGB 14.6 11/16/2017   HCT 43.4 11/16/2017   MCV 92.5 11/16/2017   PLT 288.0 11/16/2017   No results found for: IRON, TIBC, FERRITIN  Attestation Statements:   Reviewed by clinician on day of visit: allergies, medications, problem list, medical history, surgical history, family history, social history, and previous encounter notes.  Time spent on visit  including pre-visit chart review and post-visit care was 20 minutes.   Migdalia Dk, am acting as Location manager for [employer], DO   I have reviewed the above documentation for accuracy and completeness, and I agree with the above. Jearld Lesch, DO

## 2019-12-14 ENCOUNTER — Ambulatory Visit (INDEPENDENT_AMBULATORY_CARE_PROVIDER_SITE_OTHER): Payer: BC Managed Care – PPO | Admitting: Professional

## 2019-12-14 DIAGNOSIS — F4321 Adjustment disorder with depressed mood: Secondary | ICD-10-CM

## 2019-12-15 ENCOUNTER — Ambulatory Visit: Payer: BC Managed Care – PPO | Admitting: Professional

## 2019-12-17 ENCOUNTER — Institutional Professional Consult (permissible substitution): Payer: BC Managed Care – PPO | Admitting: Pulmonary Disease

## 2019-12-22 ENCOUNTER — Telehealth: Payer: BC Managed Care – PPO | Admitting: Physician Assistant

## 2019-12-22 DIAGNOSIS — J019 Acute sinusitis, unspecified: Secondary | ICD-10-CM

## 2019-12-22 DIAGNOSIS — B9689 Other specified bacterial agents as the cause of diseases classified elsewhere: Secondary | ICD-10-CM | POA: Diagnosis not present

## 2019-12-22 MED ORDER — AMOXICILLIN-POT CLAVULANATE 875-125 MG PO TABS: 1.0000 | ORAL_TABLET | Freq: Two times a day (BID) | ORAL | 0 refills | Status: DC

## 2019-12-22 NOTE — Progress Notes (Signed)
We are sorry that you are not feeling well.  Here is how we plan to help!  Based on what you have shared with me it looks like you have sinusitis.  Sinusitis is inflammation and infection in the sinus cavities of the head.  Based on your presentation I believe you most likely have Acute Bacterial Sinusitis.  This is an infection caused by bacteria and is treated with antibiotics. I have prescribed Augmentin 875mg/125mg one tablet twice daily with food, for 7 days. You may use an oral decongestant such as Mucinex D or if you have glaucoma or high blood pressure use plain Mucinex. Saline nasal spray help and can safely be used as often as needed for congestion.  If you develop worsening sinus pain, fever or notice severe headache and vision changes, or if symptoms are not better after completion of antibiotic, please schedule an appointment with a health care provider.    Sinus infections are not as easily transmitted as other respiratory infection, however we still recommend that you avoid close contact with loved ones, especially the very young and elderly.  Remember to wash your hands thoroughly throughout the day as this is the number one way to prevent the spread of infection!  Home Care:  Only take medications as instructed by your medical team.  Complete the entire course of an antibiotic.  Do not take these medications with alcohol.  A steam or ultrasonic humidifier can help congestion.  You can place a towel over your head and breathe in the steam from hot water coming from a faucet.  Avoid close contacts especially the very young and the elderly.  Cover your mouth when you cough or sneeze.  Always remember to wash your hands.  Get Help Right Away If:  You develop worsening fever or sinus pain.  You develop a severe head ache or visual changes.  Your symptoms persist after you have completed your treatment plan.  Make sure you  Understand these instructions.  Will watch your  condition.  Will get help right away if you are not doing well or get worse.  Your e-visit answers were reviewed by a board certified advanced clinical practitioner to complete your personal care plan.  Depending on the condition, your plan could have included both over the counter or prescription medications.  If there is a problem please reply  once you have received a response from your provider.  Your safety is important to us.  If you have drug allergies check your prescription carefully.    You can use MyChart to ask questions about today's visit, request a non-urgent call back, or ask for a work or school excuse for 24 hours related to this e-Visit. If it has been greater than 24 hours you will need to follow up with your provider, or enter a new e-Visit to address those concerns.  You will get an e-mail in the next two days asking about your experience.  I hope that your e-visit has been valuable and will speed your recovery. Thank you for using e-visits.  Bless Belshe PA-C  Approximately 5 minutes was spent documenting and reviewing patient's chart.    

## 2019-12-25 ENCOUNTER — Other Ambulatory Visit: Payer: Self-pay

## 2019-12-25 ENCOUNTER — Telehealth (INDEPENDENT_AMBULATORY_CARE_PROVIDER_SITE_OTHER): Payer: BC Managed Care – PPO | Admitting: Bariatrics

## 2019-12-25 ENCOUNTER — Encounter (INDEPENDENT_AMBULATORY_CARE_PROVIDER_SITE_OTHER): Payer: Self-pay | Admitting: Bariatrics

## 2019-12-25 DIAGNOSIS — Z6841 Body Mass Index (BMI) 40.0 and over, adult: Secondary | ICD-10-CM | POA: Diagnosis not present

## 2019-12-25 DIAGNOSIS — F3289 Other specified depressive episodes: Secondary | ICD-10-CM

## 2019-12-25 DIAGNOSIS — E559 Vitamin D deficiency, unspecified: Secondary | ICD-10-CM | POA: Diagnosis not present

## 2019-12-26 MED ORDER — TOPIRAMATE 25 MG PO TABS: 25.0000 mg | ORAL_TABLET | Freq: Two times a day (BID) | ORAL | 0 refills | Status: DC

## 2019-12-26 MED ORDER — VITAMIN D (ERGOCALCIFEROL) 1.25 MG (50000 UNIT) PO CAPS: 50000.0000 [IU] | ORAL_CAPSULE | ORAL | 0 refills | Status: DC

## 2019-12-26 NOTE — Progress Notes (Signed)
TeleHealth Visit:  Due to the COVID-19 pandemic, this visit was completed with telemedicine (audio/video) technology to reduce patient and provider exposure as well as to preserve personal protective equipment.   Alison Best has verbally consented to this TeleHealth visit. The patient is located at home, the provider is located at the [facility] office. The participants in this visit include the listed provider and patient. The visit was conducted today via Webex.  Chief Complaint: OBESITY Illene is here to discuss her progress with her obesity treatment plan along with follow-up of her obesity related diagnoses. Alison Best is on the Category 2 Plan and states she is following her eating plan approximately 35% of the time. Alison Best states she is exercising 0 minutes 0 times per week.  Today's visit was #: 21 Starting weight: 227 lbs Starting date: 01/11/2019  Interim History: Alison Best is doing well. She gained 2 lbs, but has stopped smoking about 1 month ago and has struggled slightly more during that time. She is doing better with her walking.  Subjective:   Vitamin D deficiency. No nausea, vomiting, or muscle weakness.  Other depression - With emotional eating. Alison Best is struggling with emotional eating and using food for comfort to the extent that it is negatively impacting her health. She has been working on behavior modification techniques to help reduce her emotional eating and has been somewhat successful. She shows no sign of suicidal or homicidal ideations.  Assessment/Plan:   Vitamin D deficiency. Low Vitamin D level contributes to fatigue and are associated with obesity, breast, and colon cancer. She was given a prescription for Vitamin D, Ergocalciferol, (DRISDOL) 1.25 MG (50000 UNIT) CAPS capsule every week #4 with 0 refills and will follow-up for routine testing of Vitamin D, at least 2-3 times per year to avoid over-replacement.    Other depression - With  emotional eating. Behavior modification techniques were discussed today to help Alison Best deal with her emotional/non-hunger eating behaviors.  Orders and follow up as documented in patient record. Deena was given a prescription for topiramate (TOPAMAX) 25 MG tablet 1 PO BID #60 with 0 refills.  Class 3 severe obesity with serious comorbidity and body mass index (BMI) of 40.0 to 44.9 in adult, unspecified obesity type ([place]).  Alison Best is currently in the action stage of change. As such, her goal is to continue with weight loss efforts. She has agreed to the Category 2 Plan.   She will work on meal planning, mindful eating, and increasing salads.  Exercise goals: Sitlaly will increase her walking.  Behavioral modification strategies: increasing lean protein intake, decreasing simple carbohydrates, increasing vegetables, increasing water intake, decreasing eating out, no skipping meals, meal planning and cooking strategies, keeping healthy foods in the home, emotional eating strategies and planning for success.  Alison Best has agreed to follow-up with our clinic in 2 weeks. She was informed of the importance of frequent follow-up visits to maximize her success with intensive lifestyle modifications for her multiple health conditions.  Objective:   VITALS: Per patient if applicable, see vitals. GENERAL: Alert and in no acute distress. CARDIOPULMONARY: No increased WOB. Speaking in clear sentences.  PSYCH: Pleasant and cooperative. Speech normal rate and rhythm. Affect is appropriate. Insight and judgement are appropriate. Attention is focused, linear, and appropriate.  NEURO: Oriented as arrived to appointment on time with no prompting.   Lab Results  Component Value Date   CREATININE 0.86 10/05/2019   BUN 15 10/05/2019   NA 143 10/05/2019  K 4.4 10/05/2019   CL 109 10/05/2019   CO2 23 10/05/2019   Lab Results  Component Value Date   ALT 23 10/05/2019   AST 17 10/05/2019   ALKPHOS 84 01/11/2019    BILITOT 0.4 10/05/2019   Lab Results  Component Value Date   HGBA1C 6.0 10/05/2019   HGBA1C 6.2 (A) 03/23/2019   HGBA1C 5.7 (A) 09/20/2018   HGBA1C 5.5 09/30/2017   HGBA1C 6.3 04/01/2017   Lab Results  Component Value Date   INSULIN 21.2 01/11/2019   Lab Results  Component Value Date   TSH 2.88 10/05/2019   Lab Results  Component Value Date   CHOL 106 10/05/2019   HDL 23.70 (L) 10/05/2019   LDLCALC 93 11/02/2011   LDLDIRECT 29.0 10/05/2019   TRIG 211.0 (H) 10/05/2019   CHOLHDL 4 10/05/2019   Lab Results  Component Value Date   WBC 9.5 11/16/2017   HGB 14.6 11/16/2017   HCT 43.4 11/16/2017   MCV 92.5 11/16/2017   PLT 288.0 11/16/2017   No results found for: IRON, TIBC, FERRITIN  Attestation Statements:   Reviewed by clinician on day of visit: allergies, medications, problem list, medical history, surgical history, family history, social history, and previous encounter notes.  Migdalia Dk, am acting as Location manager for [employer], DO   I have reviewed the above documentation for accuracy and completeness, and I agree with the above. Jearld Lesch, DO

## 2019-12-29 ENCOUNTER — Ambulatory Visit: Payer: BC Managed Care – PPO | Admitting: Professional

## 2020-01-04 ENCOUNTER — Institutional Professional Consult (permissible substitution): Payer: BC Managed Care – PPO | Admitting: Pulmonary Disease

## 2020-01-08 ENCOUNTER — Telehealth (INDEPENDENT_AMBULATORY_CARE_PROVIDER_SITE_OTHER): Payer: BC Managed Care – PPO | Admitting: Bariatrics

## 2020-01-08 ENCOUNTER — Other Ambulatory Visit: Payer: Self-pay

## 2020-01-08 DIAGNOSIS — Z6841 Body Mass Index (BMI) 40.0 and over, adult: Secondary | ICD-10-CM

## 2020-01-08 DIAGNOSIS — F3289 Other specified depressive episodes: Secondary | ICD-10-CM | POA: Diagnosis not present

## 2020-01-08 DIAGNOSIS — G4733 Obstructive sleep apnea (adult) (pediatric): Secondary | ICD-10-CM | POA: Diagnosis not present

## 2020-01-08 DIAGNOSIS — R7303 Prediabetes: Secondary | ICD-10-CM

## 2020-01-08 MED ORDER — BUPROPION HCL ER (SR) 150 MG PO TB12: 150.0000 mg | ORAL_TABLET | Freq: Every day | ORAL | 0 refills | Status: DC

## 2020-01-08 NOTE — Progress Notes (Signed)
TeleHealth Visit:  Due to the COVID-19 pandemic, this visit was completed with telemedicine (audio/video) technology to reduce patient and provider exposure as well as to preserve personal protective equipment.   Alison Best has verbally consented to this TeleHealth visit. The patient is located at home, the provider is located at the [facility] office. The participants in this visit include the listed provider and patient. The visit was conducted today via Webex.  Chief Complaint: OBESITY Alison Best is here to discuss her progress with her obesity treatment plan along with follow-up of her obesity related diagnoses. Alison Best is on the Category 2 Plan and states she is following her eating plan approximately 30% of the time. Bethanny states she is exercising 0 minutes 0 times per week.  Today's visit was #: 22 Starting weight: 227 lbs Starting date: 01/11/2019  Interim History: Alison Best states that she is up a couple of lbs (weight 225). She has not smoked any cigarettes for 5-6 weeks. She reports drinking more water.  Subjective:   Prediabetes. [name] has a diagnosis of prediabetes based on her elevated HgA1c and was informed this puts her at greater risk of developing diabetes. She continues to work on diet and exercise to decrease her risk of diabetes. She denies nausea or hypoglycemia. Alison Best reports being more hungry after quitting smoking. She endorses polyphagia.  Lab Results  Component Value Date   HGBA1C 6.0 10/05/2019   Lab Results  Component Value Date   INSULIN 21.2 01/11/2019   OSA (obstructive sleep apnea). Cloteal will have a sleep study in April (is not currently wearing a CPAP). She reports good sleep.  Other depression, with emotional eating.  Alison Best is struggling with emotional eating and using food for comfort to the extent that it is negatively impacting her health. She has been working on behavior modification techniques to help reduce her emotional eating  and has been somewhat successful. She shows no sign of suicidal or homicidal ideations. Alison Best is taking Topamax, but reports it is not helping as much.  Assessment/Plan:   Prediabetes. [name] will continue to work on weight loss, exercise, increasing protein and healthy fats, and decreasing simple carbohydrates to help decrease the risk of diabetes.   OSA (obstructive sleep apnea). Intensive lifestyle modifications are the first line treatment for this issue. We discussed several lifestyle modifications today and she will continue to work on diet, exercise and weight loss efforts. We will continue to monitor. Orders and follow up as documented in patient record. Alison Best will have a new sleep study next month.  Counseling  Sleep apnea is a condition in which breathing pauses or becomes shallow during sleep. This happens over and over during the night. This disrupts your sleep and keeps your body from getting the rest that it needs, which can cause tiredness and lack of energy (fatigue) during the day.  Sleep apnea treatment: If you were given a device to open your airway while you sleep, USE IT!  Sleep hygiene:   Limit or avoid alcohol, caffeinated beverages, and cigarettes, especially close to bedtime.   Do not eat a large meal or eat spicy foods right before bedtime. This can lead to digestive discomfort that can make it hard for you to sleep.  Keep a sleep diary to help you and your health care provider figure out what could be causing your insomnia.  . Make your bedroom a dark, comfortable place where it is easy to fall asleep. ? Put up shades  or blackout curtains to block light from outside. ? Use a white noise machine to block noise. ? Keep the temperature cool. . Limit screen use before bedtime. This includes: ? Watching TV. ? Using your smartphone, tablet, or computer. . Stick to a routine that includes going to bed and waking up at the same times every day and night. This can help you  fall asleep faster. Consider making a quiet activity, such as reading, part of your nighttime routine. . Try to avoid taking naps during the day so that you sleep better at night. . Get out of bed if you are still awake after 15 minutes of trying to sleep. Keep the lights down, but try reading or doing a quiet activity. When you feel sleepy, go back to bed.  Other depression, with emotional eating. Behavior modification techniques were discussed today to help Alison Best deal with her emotional/non-hunger eating behaviors.  Orders and follow up as documented in patient record. Alison Best will taper off of Topamax over the next 5 days and stop. She was given a prescription for buPROPion (WELLBUTRIN SR) 150 MG 12 hr tablet 1 PO by mouth daily #30 with 0 refills.  Class 3 severe obesity due to excess calories with serious comorbidity and body mass index (BMI) of 40.0 to 44.9 in adult [facility]).  Alison Best is currently in the action stage of change. As such, her goal is to continue with weight loss efforts. She has agreed to the Category 2 Plan.   She will work on meal planning, mindful eating, and will not bring tempting foods home.  Exercise goals: All adults should avoid inactivity. Some physical activity is better than none, and adults who participate in any amount of physical activity gain some health benefits.  Behavioral modification strategies: increasing lean protein intake, decreasing simple carbohydrates, increasing vegetables, increasing water intake, decreasing eating out, no skipping meals, meal planning and cooking strategies, keeping healthy foods in the home and avoiding temptations.  Alison Best has agreed to follow-up with our clinic in 2 weeks. She was informed of the importance of frequent follow-up visits to maximize her success with intensive lifestyle modifications for her multiple health conditions.  Objective:   VITALS: Per patient if applicable, see vitals. GENERAL: Alert and in no acute  distress. CARDIOPULMONARY: No increased WOB. Speaking in clear sentences.  PSYCH: Pleasant and cooperative. Speech normal rate and rhythm. Affect is appropriate. Insight and judgement are appropriate. Attention is focused, linear, and appropriate.  NEURO: Oriented as arrived to appointment on time with no prompting.   Lab Results  Component Value Date   CREATININE 0.86 10/05/2019   BUN 15 10/05/2019   NA 143 10/05/2019   K 4.4 10/05/2019   CL 109 10/05/2019   CO2 23 10/05/2019   Lab Results  Component Value Date   ALT 23 10/05/2019   AST 17 10/05/2019   ALKPHOS 84 01/11/2019   BILITOT 0.4 10/05/2019   Lab Results  Component Value Date   HGBA1C 6.0 10/05/2019   HGBA1C 6.2 (A) 03/23/2019   HGBA1C 5.7 (A) 09/20/2018   HGBA1C 5.5 09/30/2017   HGBA1C 6.3 04/01/2017   Lab Results  Component Value Date   INSULIN 21.2 01/11/2019   Lab Results  Component Value Date   TSH 2.88 10/05/2019   Lab Results  Component Value Date   CHOL 106 10/05/2019   HDL 23.70 (L) 10/05/2019   LDLCALC 93 11/02/2011   LDLDIRECT 29.0 10/05/2019   TRIG 211.0 (H) 10/05/2019   CHOLHDL  4 10/05/2019   Lab Results  Component Value Date   WBC 9.5 11/16/2017   HGB 14.6 11/16/2017   HCT 43.4 11/16/2017   MCV 92.5 11/16/2017   PLT 288.0 11/16/2017   No results found for: IRON, TIBC, FERRITIN  Attestation Statements:   Reviewed by clinician on day of visit: allergies, medications, problem list, medical history, surgical history, family history, social history, and previous encounter notes.  Time spent on visit including pre-visit chart review and post-visit charting and care was 20 minutes.   Migdalia Dk, am acting as Location manager for [employer], DO   I have reviewed the above documentation for accuracy and completeness, and I agree with the above. Jearld Lesch, DO

## 2020-01-12 ENCOUNTER — Ambulatory Visit: Payer: BC Managed Care – PPO | Admitting: Professional

## 2020-01-17 ENCOUNTER — Encounter: Payer: Self-pay | Admitting: Pulmonary Disease

## 2020-01-17 ENCOUNTER — Ambulatory Visit (INDEPENDENT_AMBULATORY_CARE_PROVIDER_SITE_OTHER): Payer: BC Managed Care – PPO

## 2020-01-17 ENCOUNTER — Ambulatory Visit (INDEPENDENT_AMBULATORY_CARE_PROVIDER_SITE_OTHER): Payer: BC Managed Care – PPO | Admitting: Pulmonary Disease

## 2020-01-17 ENCOUNTER — Other Ambulatory Visit: Payer: Self-pay

## 2020-01-17 VITALS — BP 125/70 | HR 73 | Temp 97.0°F | Ht 60.0 in | Wt 229.4 lb

## 2020-01-17 DIAGNOSIS — I272 Pulmonary hypertension, unspecified: Secondary | ICD-10-CM

## 2020-01-17 DIAGNOSIS — G4733 Obstructive sleep apnea (adult) (pediatric): Secondary | ICD-10-CM

## 2020-01-17 DIAGNOSIS — R0602 Shortness of breath: Secondary | ICD-10-CM

## 2020-01-17 NOTE — Patient Instructions (Signed)
Shortness of breath Pulmonary hypertension on previous echo  Repeat echocardiogram for pulmonary hypertension Obtain home sleep study for history of obstructive sleep apnea  Chest x-ray  I will see you in about 8 weeks

## 2020-01-17 NOTE — Progress Notes (Signed)
Alison Best    BP:[id number]    [birth year]  Primary Care Physician:Fry, Ishmael Holter, MD  Referring Physician: Imogene Burn, PA-C [address] [place],  [place] [address]  Chief complaint:   Patient being seen for snoring  HPI:  Nonrestorative sleep but when she gets eszopiclone she is able to stay awake and alert Past history of mild obstructive sleep apnea, was managed with weight loss Has had some health problems recently Complicated abdominal surgery leaving with multiple hernias  She recently quit smoking and associated with this she gained about 10 pounds 40-pack-year smoking history Quit about 2 months ago  Snoring, no witnessed apneas  Echocardiogram from 2016 did reveal pulmonary hypertension   Outpatient Encounter Medications as of 01/17/2020  Medication Sig  . acetaminophen (TYLENOL) 325 MG tablet Take 2 tablets (650 mg total) by mouth every 6 (six) hours as needed for moderate pain or headache.  Marland Kitchen aspirin 81 MG tablet Take 81 mg by mouth daily.  Marland Kitchen atorvastatin (LIPITOR) 20 MG tablet Take 1 tablet (20 mg total) by mouth daily.  . Coenzyme Q10 (CO Q 10 PO) Take 1 capsule by mouth daily.  Marland Kitchen diltiazem (CARDIZEM CD) 120 MG 24 hr capsule TAKE 1 CAPSULE(120 MG) BY MOUTH DAILY  . fluticasone (FLONASE) 50 MCG/ACT nasal spray Place 2 sprays into both nostrils daily.  Marland Kitchen glucose blood (CONTOUR NEXT TEST) test strip Use to check blood sugar once a day.  Vanessa Kick Ethyl (VASCEPA) 1 g CAPS Take 2 capsules (2 g total) by mouth 2 (two) times daily.  . methimazole (TAPAZOLE) 5 MG tablet TAKE 1/2 TABLET( 2.5 MG) BY MOUTH DAILY  . metroNIDAZOLE (METROGEL) 1 % gel APPLY EXTERNALLY TO THE AFFECTED AREA DAILY  . Microlet Lancets MISC USE TO CHECK BLOOD SUGAR ONCE DAILY  . Multiple Vitamins-Minerals (MULTIVITAMIN WITH MINERALS) tablet Take 1 tablet by mouth daily.  . multivitamin-lutein (OCUVITE-LUTEIN) CAPS capsule Take 1 capsule by mouth daily.    . polyethylene glycol powder (GLYCOLAX/MIRALAX) 17 GM/SCOOP powder Take 17 g by mouth daily.  . Vitamin D, Ergocalciferol, (DRISDOL) 1.25 MG (50000 UNIT) CAPS capsule Take 1 capsule (50,000 Units total) by mouth every 7 (seven) days.  Marland Kitchen buPROPion (WELLBUTRIN SR) 150 MG 12 hr tablet Take 1 tablet (150 mg total) by mouth daily. (Patient not taking: Reported on 01/17/2020)   No facility-administered encounter medications on file as of 01/17/2020.    Allergies as of 01/17/2020 - Review Complete 01/17/2020  Allergen Reaction Noted  . Levofloxacin in d5w Other (See Comments) 06/09/2012  . Mangifera indica Anaphylaxis 07/24/2018  . Mango flavor Swelling 07/26/2017  . Bupropion Nausea And Vomiting 09/22/2015  . Levaquin [levofloxacin] Other (See Comments) 02/21/2015    Past Medical History:  Diagnosis Date  . Anal fissure   . Arthritis   . Bursitis   . Constipation   . COPD (chronic obstructive pulmonary disease) ([place])   . Diabetes mellitus without complication ([place])    sees Dr. Cruzita Lederer   . Diverticulitis of colon 02/2015  . Ectopic pregnancy   . Emphysema of lung ([place])   . Frozen shoulder   . Heart murmur   . Hernia, abdominal   . Hyperlipidemia   . Hyperthyroidism   . Infertility, female   . Irregular heartbeat   . Joint pain   . Obesity   . Osteoarthritis   . Sleep apnea   . Swallowing difficulty   . Thyroid disease  hyperthyroid    Past Surgical History:  Procedure Laterality Date  . APPENDECTOMY    . CESAREAN SECTION    . COLONOSCOPY  09-10-10   per Dr. Acquanetta Sit, diverticulosis of descending colon and sigmoid, internal hemorrhoids, repeat in 10 yrs  . COLOSTOMY N/A 02/20/2015   Procedure: COLOSTOMY;  Surgeon: Jackolyn Confer, MD;  Location: [facility];  Service: General;  Laterality: N/A;  . COLOSTOMY REVISION  02/20/2015   Procedure: Sigmoid Colectomy;  Surgeon: Jackolyn Confer, MD;  Location: [facility];  Service: General;;  . CYSTOSCOPY N/A 02/20/2015   Procedure:  Consuela Mimes;  Surgeon: Kathie Rhodes, MD;  Location: [facility];  Service: Urology;  Laterality: N/A;  . DILATION AND CURETTAGE OF UTERUS     x3  . ILEO LOOP COLOSTOMY CLOSURE N/A 09/26/2015   Procedure: LAPAROSCOPIC LYSIS OF ADHESIONS, COLOSTOMY CLOSURE;  Surgeon: Jackolyn Confer, MD;  Location: [facility];  Service: General;  Laterality: N/A;  . LAPAROTOMY N/A 02/20/2015   Procedure: Emergency EXPLORATORY and drainiage of interabdominal abcesses;  Surgeon: Jackolyn Confer, MD;  Location: [facility];  Service: General;  Laterality: N/A;  . SALPINGOOPHORECTOMY Left 02/20/2015   Procedure: SALPINGO OOPHORECTOMY;  Surgeon: Jackolyn Confer, MD;  Location: [facility];  Service: General;  Laterality: Left;  . TONSILLECTOMY  1980    Family History  Problem Relation Age of Onset  . Breast cancer Mother   . High blood pressure Mother   . Kidney disease Mother   . Sleep apnea Mother   . Obesity Mother   . CAD Father        CABG  . Skin cancer Father   . Lung cancer Father   . Diverticulitis Father   . Colon polyps Father   . High blood pressure Father   . Stroke Father   . Heart disease Father   . Sleep apnea Father   . COPD Other   . Cancer Other   . Heart disease Other   . Stroke Other   . Allergies Brother   . Diabetes Brother   . Breast cancer Maternal Grandmother   . Heart attack Maternal Grandfather   . Heart attack Paternal Grandfather   . Hypertension Neg Hx     Social History   Socioeconomic History  . Marital status: Married    Spouse name: Not on file  . Number of children: 1  . Years of education: Not on file  . Highest education level: Not on file  Occupational History  . Occupation: Equities trader at [employer]: [place]  Tobacco Use  . Smoking status: Former Smoker    Packs/day: 0.25    Years: 40.00    Pack years: 10.00    Types: Cigarettes  . Smokeless tobacco: Never Used  . Tobacco comment: recently quit smoking, 2 mos ago    Substance and Sexual Activity  . Alcohol use: Yes    Alcohol/week: 0.0 standard drinks    Comment: rare  . Drug use: No  . Sexual activity: Not Currently    Partners: Male    Comment: 1ST intercourse- 30, partners- 61,  married x 32 yrs   Other Topics Concern  . Not on file  Social History Narrative  . Not on file   Social Determinants of Health   Financial Resource Strain:   . Difficulty of Paying Living Expenses:   Food Insecurity:   . Worried About Charity fundraiser in the Last Year:   . Ran  Out of Food in the Last Year:   Transportation Needs:   . Lack of Transportation (Medical):   Marland Kitchen Lack of Transportation (Non-Medical):   Physical Activity:   . Days of Exercise per Week:   . Minutes of Exercise per Session:   Stress:   . Feeling of Stress :   Social Connections:   . Frequency of Communication with Friends and Family:   . Frequency of Social Gatherings with Friends and Family:   . Attends Religious Services:   . Active Member of Clubs or Organizations:   . Attends Archivist Meetings:   Marland Kitchen Marital Status:   Intimate Partner Violence:   . Fear of Current or Ex-Partner:   . Emotionally Abused:   Marland Kitchen Physically Abused:   . Sexually Abused:     Review of Systems  Respiratory: Positive for shortness of breath.   Musculoskeletal: Positive for arthralgias.  Psychiatric/Behavioral: Positive for sleep disturbance.    Vitals:   01/17/20 1559  BP: 125/70  Pulse: 73  Temp: (!) 97 F (36.1 C)  SpO2: 97%     Physical Exam  Constitutional: She is oriented to person, place, and time. She appears well-developed. No distress.  HENT:  Head: Normocephalic and atraumatic.  Eyes: Pupils are equal, round, and reactive to light. Right eye exhibits no discharge. Left eye exhibits no discharge.  Neck: No tracheal deviation present. No thyromegaly present.  Cardiovascular: Normal rate and regular rhythm.  Pulmonary/Chest: Effort normal and breath sounds normal. No  respiratory distress. She has no wheezes. She has no rales. She exhibits no tenderness.  Musculoskeletal:        General: Normal range of motion.     Cervical back: Normal range of motion and neck supple.  Neurological: She is alert and oriented to person, place, and time.  Skin: Skin is warm and dry. She is not diaphoretic.  Psychiatric: She has a normal mood and affect.    Results of the Epworth flowsheet 01/17/2020 08/07/2019  Sitting and reading 1 1  Watching TV 1 1  Sitting, inactive in a public place (e.g. a theatre or a meeting) 0 0  As a passenger in a car for an hour without a break 0 1  Lying down to rest in the afternoon when circumstances permit 3 3  Sitting and talking to someone 0 0  Sitting quietly after a lunch without alcohol 0 3  In a car, while stopped for a few minutes in traffic 0 0  Total score 5 9   Data Reviewed: Last echocardiogram on record was from 2016-elevated troponin with hypertension  Assessment:  History of obstructive sleep apnea History of pulmonary hypertension Nonrestorative sleep  Pathophysiology of sleep disordered breathing discussed Treatment options discussed  Plan/Recommendations: We will schedule patient for a home sleep study  Schedule echocardiogram to assess right sided cardiac pressures  Chest x-ray today  Continue weight loss efforts  I will see her back in the office in about 2 months   Sherrilyn Rist MD [facility] 01/17/2020, 4:56 PM  CC: Imogene Burn, PA-C

## 2020-01-22 NOTE — Telephone Encounter (Signed)
Called and spoke to patient. Scheduled her to see Ermalinda Barrios, PA on 4/14 at 1:45 and echo to be done at 2:05. Made patient aware that we will be in touch with her once we have the monitor results. She verbalized understanding and thanked me for the call.

## 2020-01-24 ENCOUNTER — Ambulatory Visit: Payer: BC Managed Care – PPO | Admitting: Professional

## 2020-01-26 ENCOUNTER — Ambulatory Visit: Payer: BC Managed Care – PPO | Admitting: Professional

## 2020-01-31 ENCOUNTER — Ambulatory Visit (INDEPENDENT_AMBULATORY_CARE_PROVIDER_SITE_OTHER): Payer: BC Managed Care – PPO | Admitting: Bariatrics

## 2020-02-01 NOTE — Telephone Encounter (Signed)
Please advise on patient mychart message:    hi there.  I messaged earlier in the week requesting a follow up appt per Dr. Judson Roch recommendation and i will wait to hear back from your office.  In the meantime, i think i am supposed to do a home sleep study.  What do i do to get that equipment?  Thanks - Alison Best

## 2020-02-04 NOTE — Progress Notes (Signed)
Cardiology Office Note    Date:  02/06/2020   ID:  Alison Best, DOB February 09, 1956, MRN [id number]  PCP:  Laurey Morale, MD  Cardiologist: Lauree Chandler, MD EPS: None  Chief Complaint  Patient presents with  . Follow-up    History of Present Illness:  Alison Best is a 64 y.o. female with  history of paroxysmal atrial fibrillation, hyperlipidemia, sleep apnea, hyperthyroidism, carotid artery disease and tobacco abuse.   During her hospitalization in 2016 for perforated diverticulitis, her EKG showed T wave inversions and ST depression.Echo with normal LV function and TR with no other valve disease. Stress myoview 05/28/15 with no ischemia. When she presented for her echo she was in rapid atrial fib.Found to be hyperthyroid. She was started on Toprol and ASA given CHADS VASC score of 1.Carotid dopplers August 2016 with mild bilateral carotid disease.  She did not tolerate beta blockers due to fatigue. She has had no known recurrence of atrial fibrillation since 2016.    I had a telemedicine visit with the patient 08/07/2019 at which time she was having increase in palpitations and was smoking more. Holter monitor 01/23/2020 showed some early atrial and ventricular beats and occasionally her heart would speed up for a few seconds but it looks like atrial tachycardia not atrial fibrillation.  Patient comes in for f/u. Her mother died last week which has been very stressful.  Having an echo today ordered by pulmonary to look at right heart pressures. Having sleep study.CXR was normal. She quit smoking which made her instantly feel better but has had a few since her mom passed. No cardiac complaints. Has sinus infection-green drainage and asking for an antibiotic.   Past Medical History:  Diagnosis Date  . Anal fissure   . Arthritis   . Bursitis   . Constipation   . COPD (chronic obstructive pulmonary disease) ([place])   . Diabetes mellitus without complication  ([place])    sees Dr. Cruzita Lederer   . Diverticulitis of colon 02/2015  . Ectopic pregnancy   . Emphysema of lung ([place])   . Frozen shoulder   . Heart murmur   . Hernia, abdominal   . Hyperlipidemia   . Hyperthyroidism   . Infertility, female   . Irregular heartbeat   . Joint pain   . Obesity   . Osteoarthritis   . Sleep apnea   . Swallowing difficulty   . Thyroid disease    hyperthyroid    Past Surgical History:  Procedure Laterality Date  . APPENDECTOMY    . CESAREAN SECTION    . COLONOSCOPY  09-10-10   per Dr. Acquanetta Sit, diverticulosis of descending colon and sigmoid, internal hemorrhoids, repeat in 10 yrs  . COLOSTOMY N/A 02/20/2015   Procedure: COLOSTOMY;  Surgeon: Jackolyn Confer, MD;  Location: [facility];  Service: General;  Laterality: N/A;  . COLOSTOMY REVISION  02/20/2015   Procedure: Sigmoid Colectomy;  Surgeon: Jackolyn Confer, MD;  Location: [facility];  Service: General;;  . CYSTOSCOPY N/A 02/20/2015   Procedure: Consuela Mimes;  Surgeon: Kathie Rhodes, MD;  Location: [facility];  Service: Urology;  Laterality: N/A;  . DILATION AND CURETTAGE OF UTERUS     x3  . ILEO LOOP COLOSTOMY CLOSURE N/A 09/26/2015   Procedure: LAPAROSCOPIC LYSIS OF ADHESIONS, COLOSTOMY CLOSURE;  Surgeon: Jackolyn Confer, MD;  Location: [facility];  Service: General;  Laterality: N/A;  . LAPAROTOMY N/A 02/20/2015   Procedure: Emergency EXPLORATORY and drainiage of interabdominal abcesses;  Surgeon: Jackolyn Confer,  MD;  Location: [facility];  Service: General;  Laterality: N/A;  . SALPINGOOPHORECTOMY Left 02/20/2015   Procedure: SALPINGO OOPHORECTOMY;  Surgeon: Jackolyn Confer, MD;  Location: [facility];  Service: General;  Laterality: Left;  . TONSILLECTOMY  1980    Current Medications: Current Meds  Medication Sig  . acetaminophen (TYLENOL) 325 MG tablet Take 2 tablets (650 mg total) by mouth every 6 (six) hours as needed for moderate pain or headache.  Marland Kitchen aspirin 81 MG tablet Take 81 mg by mouth daily.  Marland Kitchen atorvastatin  (LIPITOR) 20 MG tablet Take 1 tablet (20 mg total) by mouth daily.  Marland Kitchen buPROPion (WELLBUTRIN SR) 150 MG 12 hr tablet Take 1 tablet (150 mg total) by mouth daily.  . Coenzyme Q10 (CO Q 10 PO) Take 1 capsule by mouth daily.  Marland Kitchen diltiazem (CARDIZEM CD) 120 MG 24 hr capsule TAKE 1 CAPSULE(120 MG) BY MOUTH DAILY  . fluticasone (FLONASE) 50 MCG/ACT nasal spray Place 2 sprays into both nostrils daily.  Marland Kitchen glucose blood (CONTOUR NEXT TEST) test strip Use to check blood sugar once a day.  Vanessa Kick Ethyl (VASCEPA) 1 g CAPS Take 2 capsules (2 g total) by mouth 2 (two) times daily.  . methimazole (TAPAZOLE) 5 MG tablet TAKE 1/2 TABLET( 2.5 MG) BY MOUTH DAILY  . metroNIDAZOLE (METROGEL) 1 % gel APPLY EXTERNALLY TO THE AFFECTED AREA DAILY  . Microlet Lancets MISC USE TO CHECK BLOOD SUGAR ONCE DAILY  . Multiple Vitamins-Minerals (MULTIVITAMIN WITH MINERALS) tablet Take 1 tablet by mouth daily.  . multivitamin-lutein (OCUVITE-LUTEIN) CAPS capsule Take 1 capsule by mouth daily.  . polyethylene glycol powder (GLYCOLAX/MIRALAX) 17 GM/SCOOP powder Take 17 g by mouth daily.  . Vitamin D, Ergocalciferol, (DRISDOL) 1.25 MG (50000 UNIT) CAPS capsule Take 1 capsule (50,000 Units total) by mouth every 7 (seven) days.     Allergies:   Levofloxacin in d5w, Mangifera indica, Mango flavor, Bupropion, and Levaquin [levofloxacin]   Social History   Socioeconomic History  . Marital status: Married    Spouse name: Not on file  . Number of children: 1  . Years of education: Not on file  . Highest education level: Not on file  Occupational History  . Occupation: Equities trader at [employer]: [employer]  Tobacco Use  . Smoking status: Former Smoker    Packs/day: 0.25    Years: 40.00    Pack years: 10.00    Types: Cigarettes  . Smokeless tobacco: Never Used  . Tobacco comment: recently quit smoking, 2 mos ago  Substance and Sexual Activity  . Alcohol use: Yes    Alcohol/week:  0.0 standard drinks    Comment: rare  . Drug use: No  . Sexual activity: Not Currently    Partners: Male    Comment: 1ST intercourse- 67, partners- 70,  married x 32 yrs   Other Topics Concern  . Not on file  Social History Narrative  . Not on file   Social Determinants of Health   Financial Resource Strain:   . Difficulty of Paying Living Expenses:   Food Insecurity:   . Worried About Charity fundraiser in the Last Year:   . Arboriculturist in the Last Year:   Transportation Needs:   . Film/video editor (Medical):   Marland Kitchen Lack of Transportation (Non-Medical):   Physical Activity:   . Days of Exercise per Week:   . Minutes of Exercise per Session:   Stress:   .  Feeling of Stress :   Social Connections:   . Frequency of Communication with Friends and Family:   . Frequency of Social Gatherings with Friends and Family:   . Attends Religious Services:   . Active Member of Clubs or Organizations:   . Attends Archivist Meetings:   Marland Kitchen Marital Status:      Family History:  The patient's   family history includes Allergies in her brother; Breast cancer in her maternal grandmother and mother; CAD in her father; COPD in an other family member; Cancer in an other family member; Colon polyps in her father; Diabetes in her brother; Diverticulitis in her father; Heart attack in her maternal grandfather and paternal grandfather; Heart disease in her father and another family member; High blood pressure in her father and mother; Kidney disease in her mother; Lung cancer in her father; Obesity in her mother; Skin cancer in her father; Sleep apnea in her father and mother; Stroke in her father and another family member.   ROS:   Please see the history of present illness.    ROS All other systems reviewed and are negative.   PHYSICAL EXAM:   VS:  BP 140/84   Pulse 81   Ht 5' (1.524 m)   Wt 227 lb 1.9 oz (103 kg)   LMP 03/22/2012   SpO2 96%   BMI 44.36 kg/m   Physical Exam    [id number], in no acute distress  Neck: no JVD, carotid bruits, or masses Cardiac:RRR; no murmurs, rubs, or gallops  Respiratory:  clear to auscultation bilaterally, normal work of breathing GI: soft, nontender, nondistended, + BS Ext: without cyanosis, clubbing, or edema, Good distal pulses bilaterally Neuro:  Alert and Oriented x 3 Psych: euthymic mood, full affect  Wt Readings from Last 3 Encounters:  02/06/20 227 lb 1.9 oz (103 kg)  01/17/20 229 lb 6.4 oz (104.1 kg)  09/05/19 224 lb (101.6 kg)      Studies/Labs Reviewed:   EKG:  EKG is  ordered today.  The ekg ordered today demonstrates NSR, low voltage, no change  Recent Labs: 10/05/2019: ALT 23; BUN 15; Creat 0.86; Potassium 4.4; Sodium 143; TSH 2.88   Lipid Panel    Component Value Date/Time   CHOL 106 10/05/2019 0849   TRIG 211.0 (H) 10/05/2019 0849   HDL 23.70 (L) 10/05/2019 0849   CHOLHDL 4 10/05/2019 0849   VLDL 42.2 (H) 10/05/2019 0849   LDLCALC 93 11/02/2011 0947   LDLDIRECT 29.0 10/05/2019 0849    Additional studies/ records that were reviewed today include:   Holter monitor 01/21/2020 Sinus rhythm Premature atrial contractions with short runs of atrial tachycardia (several seconds).  Premature ventricular contractions    Carotid Dopplers 11/2020Summary:  Right Carotid: The extracranial vessels were near-normal with only minimal  wall                thickening or plaque. The RICA velocities have decreased  compared                to prior exam.   Left Carotid: The extracranial vessels were near-normal with only minimal  wall               thickening or plaque. The LICA velocities have decreased  compared               to prior exam.   Vertebrals:  Bilateral vertebral arteries demonstrate antegrade flow.  Subclavians: Right subclavian artery was stenotic. Normal flow  hemodynamics were               seen in the left subclavian artery.   *See table(s) above for measurements and observations.      Echo 06/03/15: Left ventricle: The cavity size was normal. Wall thickness was   normal. Systolic function was normal. The estimated ejection   fraction was in the range of 60% to 65%. Wall motion was normal;   there were no regional wall motion abnormalities. - Left atrium: The atrium was moderately dilated. - Tricuspid valve: There was mild-moderate regurgitation. - Pulmonary arteries: Systolic pressure was moderately increased.   PA peak pressure: 51 mm Hg (S).   Carotids 2016 1-39% bilateral ICA    NST 2016Nuclear stress EF: 78%.  There was no ST segment deviation noted during stress.  The study is normal.  This is a low risk study.  The left ventricular ejection fraction is hyperdynamic (>65%).  Decreased exercise capacity.  A run of nsVT - 4 beats during stress.   Low risk study with no infarct or ischemia.       ASSESSMENT:    1. Palpitations   2. Paroxysmal atrial fibrillation (HCC)   3. Bilateral carotid bruits   4. Tobacco abuse   5. Mixed hyperlipidemia   6. OSA (obstructive sleep apnea)   7. Class 3 severe obesity with serious comorbidity and body mass index (BMI) of 40.0 to 44.9 in adult, unspecified obesity type ([place])   8. Acute maxillary sinusitis, recurrence not specified      PLAN:  In order of problems listed above:  Palpitations with recent Holter monitor 01/21/2020 with short runs of atrial tachycardia and PVCs.  No recurrent atrial fibrillation.No increase in palpitations.continue current dose cardizem.  PAF in setting of hyperthyroidism 2016. CHADSVASC=1 but would be 2 if carotid disease was counted.  Carotids with minimal plaque 08/2019.  Holter monitor last month without recurrent atrial fibrillation   Mild carotid disease minimal plaque 08/2019   Tobacco abuse smoking cessation discussed-she did stop but has had a few with the passing of her mother.   HLD cholesterol 313, triglycerides over 400 02/2019 on fish oil. Started lipitor 10 mg  once daily, Vascepa 2 gm bid   OSA not using CPAP being evaluated by pulmonary. Echo being done today for right heart pressures.   Obesity-going to weight loss center now. Recommend 150 min exercise weekly.  Sinusitis will give a Rx augmentin 875/125 mg bid for 10 days. She wants to see ENT and will schedule appt.       Medication Adjustments/Labs and Tests Ordered: Current medicines are reviewed at length with the patient today.  Concerns regarding medicines are outlined above.  Medication changes, Labs and Tests ordered today are listed in the Patient Instructions below. Patient Instructions  Medication Instructions:  Your physician has recommended you make the following change in your medication:   START: amoxicillin-clavulanate (augmentin) 875-125 mg tablet: Take 1 tablet by mouth twice a day for 10 days  *If you need a refill on your cardiac medications before your next appointment, please call your pharmacy*   Lab Work: None ordered  If you have labs (blood work) drawn today and your tests are completely normal, you will receive your results only by: Marland Kitchen MyChart Message (if you have MyChart) OR . A paper copy in the mail If you have any lab test that is abnormal or we need to change your treatment, we will call you to review the results.  Testing/Procedures: None ordered   Follow-Up: At [facility], you and your health needs are our priority.  As part of our continuing mission to provide you with exceptional heart care, we have created designated Provider Care Teams.  These Care Teams include your primary Cardiologist (physician) and Advanced Practice Providers (APPs -  Physician Assistants and Nurse Practitioners) who all work together to provide you with the care you need, when you need it.  We recommend signing up for the patient portal called "MyChart".  Sign up information is provided on this After Visit Summary.  MyChart is used to connect with patients for  Virtual Visits (Telemedicine).  Patients are able to view lab/test results, encounter notes, upcoming appointments, etc.  Non-urgent messages can be sent to your provider as well.   To learn more about what you can do with MyChart, go to [email or web address].    Your next appointment:   6 month(s)  The format for your next appointment:   In Person  Provider:   You may see Lauree Chandler, MD or one of the following Advanced Practice Providers on your designated Care Team:    Melina Copa, PA-C  Ermalinda Barrios, PA-C    Other Instructions      Signed, Ermalinda Barrios, PA-C  02/06/2020 2:26 PM    [address], [place], [place]  [address] Phone: [phone]; Fax: [phone]

## 2020-02-06 ENCOUNTER — Other Ambulatory Visit (HOSPITAL_COMMUNITY): Payer: BC Managed Care – PPO

## 2020-02-06 ENCOUNTER — Ambulatory Visit (HOSPITAL_COMMUNITY): Payer: BC Managed Care – PPO | Attending: Cardiovascular Disease

## 2020-02-06 ENCOUNTER — Other Ambulatory Visit: Payer: Self-pay

## 2020-02-06 ENCOUNTER — Ambulatory Visit: Payer: BC Managed Care – PPO | Admitting: Physician Assistant

## 2020-02-06 ENCOUNTER — Encounter: Payer: Self-pay | Admitting: Physician Assistant

## 2020-02-06 VITALS — BP 140/84 | HR 81 | Ht 60.0 in | Wt 227.1 lb

## 2020-02-06 DIAGNOSIS — R002 Palpitations: Secondary | ICD-10-CM

## 2020-02-06 DIAGNOSIS — Z6841 Body Mass Index (BMI) 40.0 and over, adult: Secondary | ICD-10-CM

## 2020-02-06 DIAGNOSIS — J0100 Acute maxillary sinusitis, unspecified: Secondary | ICD-10-CM

## 2020-02-06 DIAGNOSIS — I272 Pulmonary hypertension, unspecified: Secondary | ICD-10-CM | POA: Diagnosis not present

## 2020-02-06 DIAGNOSIS — Z720 Tobacco use: Secondary | ICD-10-CM

## 2020-02-06 DIAGNOSIS — G4733 Obstructive sleep apnea (adult) (pediatric): Secondary | ICD-10-CM

## 2020-02-06 DIAGNOSIS — E782 Mixed hyperlipidemia: Secondary | ICD-10-CM

## 2020-02-06 DIAGNOSIS — I480 Paroxysmal atrial fibrillation: Secondary | ICD-10-CM | POA: Diagnosis not present

## 2020-02-06 DIAGNOSIS — R0989 Other specified symptoms and signs involving the circulatory and respiratory systems: Secondary | ICD-10-CM

## 2020-02-06 LAB — ECHOCARDIOGRAM COMPLETE: Height: 60 in

## 2020-02-06 MED ORDER — AMOXICILLIN-POT CLAVULANATE 875-125 MG PO TABS: 1.0000 | ORAL_TABLET | Freq: Two times a day (BID) | ORAL | 0 refills | Status: DC

## 2020-02-06 NOTE — Patient Instructions (Signed)
Medication Instructions:  Your physician has recommended you make the following change in your medication:   START: amoxicillin-clavulanate (augmentin) 875-125 mg tablet: Take 1 tablet by mouth twice a day for 10 days  *If you need a refill on your cardiac medications before your next appointment, please call your pharmacy*   Lab Work: None ordered  If you have labs (blood work) drawn today and your tests are completely normal, you will receive your results only by: Marland Kitchen MyChart Message (if you have MyChart) OR . A paper copy in the mail If you have any lab test that is abnormal or we need to change your treatment, we will call you to review the results.   Testing/Procedures: None ordered   Follow-Up: At [facility], you and your health needs are our priority.  As part of our continuing mission to provide you with exceptional heart care, we have created designated Provider Care Teams.  These Care Teams include your primary Cardiologist (physician) and Advanced Practice Providers (APPs -  Physician Assistants and Nurse Practitioners) who all work together to provide you with the care you need, when you need it.  We recommend signing up for the patient portal called "MyChart".  Sign up information is provided on this After Visit Summary.  MyChart is used to connect with patients for Virtual Visits (Telemedicine).  Patients are able to view lab/test results, encounter notes, upcoming appointments, etc.  Non-urgent messages can be sent to your provider as well.   To learn more about what you can do with MyChart, go to [email or web address].    Your next appointment:   6 month(s)  The format for your next appointment:   In Person  Provider:   You may see Lauree Chandler, MD or one of the following Advanced Practice Providers on your designated Care Team:    Melina Copa, PA-C  Ermalinda Barrios, PA-C    Other Instructions

## 2020-02-07 ENCOUNTER — Ambulatory Visit (INDEPENDENT_AMBULATORY_CARE_PROVIDER_SITE_OTHER): Payer: BC Managed Care – PPO | Admitting: Bariatrics

## 2020-02-07 ENCOUNTER — Encounter (INDEPENDENT_AMBULATORY_CARE_PROVIDER_SITE_OTHER): Payer: Self-pay | Admitting: Bariatrics

## 2020-02-07 ENCOUNTER — Other Ambulatory Visit: Payer: Self-pay

## 2020-02-07 VITALS — BP 146/84 | HR 79 | Temp 98.4°F | Ht 60.0 in | Wt 225.0 lb

## 2020-02-07 DIAGNOSIS — E559 Vitamin D deficiency, unspecified: Secondary | ICD-10-CM | POA: Diagnosis not present

## 2020-02-07 DIAGNOSIS — F3289 Other specified depressive episodes: Secondary | ICD-10-CM

## 2020-02-07 DIAGNOSIS — Z9189 Other specified personal risk factors, not elsewhere classified: Secondary | ICD-10-CM | POA: Diagnosis not present

## 2020-02-07 DIAGNOSIS — Z6841 Body Mass Index (BMI) 40.0 and over, adult: Secondary | ICD-10-CM

## 2020-02-07 MED ORDER — VITAMIN D (ERGOCALCIFEROL) 1.25 MG (50000 UNIT) PO CAPS: 50000.0000 [IU] | ORAL_CAPSULE | ORAL | 0 refills | Status: DC

## 2020-02-09 ENCOUNTER — Ambulatory Visit: Payer: BC Managed Care – PPO | Admitting: Professional

## 2020-02-11 ENCOUNTER — Encounter (INDEPENDENT_AMBULATORY_CARE_PROVIDER_SITE_OTHER): Payer: Self-pay | Admitting: Bariatrics

## 2020-02-11 NOTE — Progress Notes (Signed)
Chief Complaint:   OBESITY Alison Best is here to discuss her progress with her obesity treatment plan along with follow-up of her obesity related diagnoses. [name] is on the Category 2 Plan and states she is following her eating plan approximately 0% of the time. [name] states she is exercising 0 minutes 0 times per week.  Today's visit was #: 23 Starting weight: 227 lbs Starting date: 01/11/2019 Today's weight: 225 lbs Today's date: 02/07/2020 Total lbs lost to date: 2 Total lbs lost since last in-office visit: 2  Interim History: Alison Best's weight is down 2 lbs. Her mother has recently passed away and she is tearful in the visit today. [name] notes "falling off the wagon" with snacking due to her mom's passing and notes church members have been bringing food. She is taking a vitamin supplement. She would like to know options to jumpstart weight loss.  Subjective:   Vitamin D deficiency. [name] is on Vitamin D 50,000 IU weekly. No nausea, vomiting, or muscle weakness. Last Vitamin D 59.61 on 10/05/2019.  Other depression, with emotional eating. [name] is struggling with emotional eating and using food for comfort to the extent that it is negatively impacting her health. She has been working on behavior modification techniques to help reduce her emotional eating and has been somewhat successful. She shows no sign of suicidal or homicidal ideations. [name] was given Wellbutrin SR 150 mg daily, but hasn't started taking it yet. She doesn't need a refill.  At risk for dehydration. [name] is at risk for dehydration secondary to starting the low carb plan.  Assessment/Plan:   Vitamin D deficiency. Low Vitamin D level contributes to fatigue and are associated with obesity, breast, and colon cancer. She was given a refill on her Vitamin D, Ergocalciferol, (DRISDOL) 1.25 MG (50000 UNIT) CAPS capsule every week #4 with 0 refills and will follow-up for routine testing of Vitamin D, at least 2-3  times per year to avoid over-replacement.    Other depression, with emotional eating. Behavior modification techniques were discussed today to help Alison Best deal with her emotional/non-hunger eating behaviors.  Orders and follow up as documented in patient record. She will take the Wellbutrin as prescribed.  At risk for dehydration. [name] was given approximately 15 minutes dehydration prevention counseling today. Alison Best is at risk for dehydration due to weight loss and current medication(s). She was encouraged to hydrate and monitor fluid status to avoid dehydration as well as weight loss plateaus.   Class 3 severe obesity due to excess calories with serious comorbidity and body mass index (BMI) of 40.0 to 44.9 in adult [facility]).  Alison Best is currently in the action stage of change. As such, her goal is to continue with weight loss efforts. She has agreed to following a lower carbohydrate, vegetable and lean protein rich diet plan.   She will work on meal planning and will hydrate with water, Power Ade, or Gatorade Zero.  Low carbohydrate plan was given today.  Exercise goals: No exercise has been prescribed at this time.  Behavioral modification strategies: increasing lean protein intake, decreasing simple carbohydrates, increasing vegetables, increasing water intake and meal planning and cooking strategies.  Alison Best has agreed to follow-up with our clinic in 4 weeks. She was informed of the importance of frequent follow-up visits to maximize her success with intensive lifestyle modifications for her multiple health conditions.   Objective:   Blood pressure (!) 146/84, pulse 79, temperature 98.4 F (36.9 C), height 5' (1.524 m), weight  225 lb (102.1 kg), last menstrual period 03/22/2012, SpO2 97 %. Body mass index is 43.94 kg/m.  General: Cooperative, alert, well developed, in no acute distress. HEENT: Conjunctivae and lids unremarkable. Cardiovascular: Regular rhythm.  Lungs: Normal work of  breathing. Neurologic: No focal deficits. Walks with a cane.  Lab Results  Component Value Date   CREATININE 0.86 10/05/2019   BUN 15 10/05/2019   NA 143 10/05/2019   K 4.4 10/05/2019   CL 109 10/05/2019   CO2 23 10/05/2019   Lab Results  Component Value Date   ALT 23 10/05/2019   AST 17 10/05/2019   ALKPHOS 84 01/11/2019   BILITOT 0.4 10/05/2019   Lab Results  Component Value Date   HGBA1C 6.0 10/05/2019   HGBA1C 6.2 (A) 03/23/2019   HGBA1C 5.7 (A) 09/20/2018   HGBA1C 5.5 09/30/2017   HGBA1C 6.3 04/01/2017   Lab Results  Component Value Date   INSULIN 21.2 01/11/2019   Lab Results  Component Value Date   TSH 2.88 10/05/2019   Lab Results  Component Value Date   CHOL 106 10/05/2019   HDL 23.70 (L) 10/05/2019   LDLCALC 93 11/02/2011   LDLDIRECT 29.0 10/05/2019   TRIG 211.0 (H) 10/05/2019   CHOLHDL 4 10/05/2019   Lab Results  Component Value Date   WBC 9.5 11/16/2017   HGB 14.6 11/16/2017   HCT 43.4 11/16/2017   MCV 92.5 11/16/2017   PLT 288.0 11/16/2017   No results found for: IRON, TIBC, FERRITIN  Attestation Statements:   Reviewed by clinician on day of visit: allergies, medications, problem list, medical history, surgical history, family history, social history, and previous encounter notes.  Migdalia Dk, am acting as Location manager for [employer], DO   I have reviewed the above documentation for accuracy and completeness, and I agree with the above. Jearld Lesch, DO

## 2020-02-15 ENCOUNTER — Other Ambulatory Visit: Payer: Self-pay

## 2020-02-15 ENCOUNTER — Ambulatory Visit: Payer: BC Managed Care – PPO

## 2020-02-15 DIAGNOSIS — G4733 Obstructive sleep apnea (adult) (pediatric): Secondary | ICD-10-CM

## 2020-02-16 ENCOUNTER — Ambulatory Visit (INDEPENDENT_AMBULATORY_CARE_PROVIDER_SITE_OTHER): Payer: BC Managed Care – PPO | Admitting: Professional

## 2020-02-16 DIAGNOSIS — F4321 Adjustment disorder with depressed mood: Secondary | ICD-10-CM | POA: Diagnosis not present

## 2020-02-23 ENCOUNTER — Ambulatory Visit: Payer: BC Managed Care – PPO | Admitting: Professional

## 2020-02-29 ENCOUNTER — Telehealth: Payer: Self-pay | Admitting: Pulmonary Disease

## 2020-02-29 DIAGNOSIS — G4733 Obstructive sleep apnea (adult) (pediatric): Secondary | ICD-10-CM | POA: Diagnosis not present

## 2020-03-04 ENCOUNTER — Telehealth: Payer: Self-pay

## 2020-03-04 NOTE — Telephone Encounter (Signed)
Attempted to call patient on 5/7 and 5/11, no option to leave a message, also called work number and left a message. Home sleep results are ready, in Dr. Judson Roch box. Plan to attempt to call again on 03/05/2020.

## 2020-03-05 ENCOUNTER — Telehealth: Payer: Self-pay | Admitting: Pulmonary Disease

## 2020-03-05 NOTE — Telephone Encounter (Signed)
Called and spoke with pt. Pt is requesting to know the results of the sleep study. Pt stated that she has scheduled a f/u with [facility] 5/20.  Dr. Jenetta Downer, please advise on this for pt.

## 2020-03-06 ENCOUNTER — Encounter (INDEPENDENT_AMBULATORY_CARE_PROVIDER_SITE_OTHER): Payer: Self-pay | Admitting: Bariatrics

## 2020-03-06 ENCOUNTER — Ambulatory Visit (INDEPENDENT_AMBULATORY_CARE_PROVIDER_SITE_OTHER): Payer: BC Managed Care – PPO | Admitting: Professional

## 2020-03-06 ENCOUNTER — Ambulatory Visit (INDEPENDENT_AMBULATORY_CARE_PROVIDER_SITE_OTHER): Payer: BC Managed Care – PPO | Admitting: Bariatrics

## 2020-03-06 ENCOUNTER — Other Ambulatory Visit: Payer: Self-pay

## 2020-03-06 ENCOUNTER — Telehealth: Payer: Self-pay | Admitting: Pulmonary Disease

## 2020-03-06 VITALS — BP 140/70 | HR 71 | Temp 98.2°F | Ht 60.0 in | Wt 222.0 lb

## 2020-03-06 DIAGNOSIS — G4733 Obstructive sleep apnea (adult) (pediatric): Secondary | ICD-10-CM

## 2020-03-06 DIAGNOSIS — F4321 Adjustment disorder with depressed mood: Secondary | ICD-10-CM | POA: Diagnosis not present

## 2020-03-06 DIAGNOSIS — F3289 Other specified depressive episodes: Secondary | ICD-10-CM

## 2020-03-06 DIAGNOSIS — Z6841 Body Mass Index (BMI) 40.0 and over, adult: Secondary | ICD-10-CM

## 2020-03-06 DIAGNOSIS — E559 Vitamin D deficiency, unspecified: Secondary | ICD-10-CM

## 2020-03-06 DIAGNOSIS — Z9189 Other specified personal risk factors, not elsewhere classified: Secondary | ICD-10-CM | POA: Diagnosis not present

## 2020-03-06 MED ORDER — VITAMIN D (ERGOCALCIFEROL) 1.25 MG (50000 UNIT) PO CAPS: 50000.0000 [IU] | ORAL_CAPSULE | ORAL | 0 refills | Status: DC

## 2020-03-06 NOTE — Progress Notes (Signed)
Chief Complaint:   OBESITY Alison Best is here to discuss her progress with her obesity treatment plan along with follow-up of her obesity related diagnoses. Alison Best is following a lower carbohydrate, vegetable and lean protein rich diet plan and states she is following her eating plan approximately 0% of the time. Alison Best states she is walking 10 minutes 3-4 times per week.  Today's visit was #: 24 Starting weight: 227 lbs Starting date: 01/11/2019 Today's weight: 222 lbs Today's date: 03/06/2020 Total lbs lost to date: 5 Total lbs lost since last in-office visit: 3  Interim History: Alison Best is down 3 lbs and doing better overall. She has made some better choices in food.  Subjective:   Vitamin D deficiency. No nausea, vomiting, or muscle weakness. Last Vitamin D 59.61 on 10/05/2019.  Other depression, with emotional eating. Alison Best is struggling with emotional eating and using food for comfort to the extent that it is negatively impacting her health. She has been working on behavior modification techniques to help reduce her emotional eating and has been somewhat successful. She shows no sign of suicidal or homicidal ideations. Alison Best has not started the Wellbutrin. She states she is doing better.  At risk for osteoporosis. Alison Best is at higher risk of osteopenia and osteoporosis due to Vitamin D deficiency.   Assessment/Plan:   Vitamin D deficiency. Low Vitamin D level contributes to fatigue and are associated with obesity, breast, and colon cancer. She was given a prescription for Vitamin D, Ergocalciferol, (DRISDOL) 1.25 MG (50000 UNIT) CAPS capsule every week #4 with 0 refills and will follow-up for routine testing of Vitamin D, at least 2-3 times per year to avoid over-replacement.    Other depression, with emotional eating. Behavior modification techniques were discussed today to help Alison Best deal with her emotional/non-hunger eating behaviors.  Orders and follow up as documented  in patient record. Alison Best will consider taking Wellbutrin, but is not sure when she will start.  At risk for osteoporosis. Alison Best was given approximately 15 minutes of osteoporosis prevention counseling today. Alison Best is at risk for osteopenia and osteoporosis due to her Vitamin D deficiency. She was encouraged to take her Vitamin D and follow her higher calcium diet and increase strengthening exercise to help strengthen her bones and decrease her risk of osteopenia and osteoporosis.  Repetitive spaced learning was employed today to elicit superior memory formation and behavioral change.  Class 3 severe obesity due to excess calories with serious comorbidity and body mass index (BMI) of 40.0 to 44.9 in adult [facility]).  Alison Best is currently in the action stage of change. As such, her goal is to continue with weight loss efforts. She has agreed to following a lower carbohydrate, vegetable and lean protein rich diet plan.   She will work on meal planning, intentional eating, and increasing her water intake.  Exercise goals: Alison Best will be more active.  Behavioral modification strategies: increasing lean protein intake, decreasing simple carbohydrates, increasing vegetables, increasing water intake, decreasing eating out, no skipping meals, meal planning and cooking strategies and keeping healthy foods in the home.  Alison Best has agreed to follow-up with our clinic in 2 weeks. She was informed of the importance of frequent follow-up visits to maximize her success with intensive lifestyle modifications for her multiple health conditions.   Objective:   Blood pressure 140/70, pulse 71, temperature 98.2 F (36.8 C), height 5' (1.524 m), weight 222 lb (100.7 kg), last menstrual period 03/22/2012, SpO2 98 %. Body mass index is  43.36 kg/m.  General: Cooperative, alert, well developed, in no acute distress. HEENT: Conjunctivae and lids unremarkable. Cardiovascular: Regular rhythm.  Lungs: Normal work of  breathing. Neurologic: No focal deficits.   Lab Results  Component Value Date   CREATININE 0.86 10/05/2019   BUN 15 10/05/2019   NA 143 10/05/2019   K 4.4 10/05/2019   CL 109 10/05/2019   CO2 23 10/05/2019   Lab Results  Component Value Date   ALT 23 10/05/2019   AST 17 10/05/2019   ALKPHOS 84 01/11/2019   BILITOT 0.4 10/05/2019   Lab Results  Component Value Date   HGBA1C 6.0 10/05/2019   HGBA1C 6.2 (A) 03/23/2019   HGBA1C 5.7 (A) 09/20/2018   HGBA1C 5.5 09/30/2017   HGBA1C 6.3 04/01/2017   Lab Results  Component Value Date   INSULIN 21.2 01/11/2019   Lab Results  Component Value Date   TSH 2.88 10/05/2019   Lab Results  Component Value Date   CHOL 106 10/05/2019   HDL 23.70 (L) 10/05/2019   LDLCALC 93 11/02/2011   LDLDIRECT 29.0 10/05/2019   TRIG 211.0 (H) 10/05/2019   CHOLHDL 4 10/05/2019   Lab Results  Component Value Date   WBC 9.5 11/16/2017   HGB 14.6 11/16/2017   HCT 43.4 11/16/2017   MCV 92.5 11/16/2017   PLT 288.0 11/16/2017   No results found for: IRON, TIBC, FERRITIN  Attestation Statements:   Reviewed by clinician on day of visit: allergies, medications, problem list, medical history, surgical history, family history, social history, and previous encounter notes.  Migdalia Dk, am acting as Location manager for [employer], DO   I have reviewed the above documentation for accuracy and completeness, and I agree with the above. Jearld Lesch, DO

## 2020-03-06 NOTE — Telephone Encounter (Signed)
Impressions  Dr. Ander Slade has reviewed the home sleep test. This showed mild obstructive sleep apnea and moderate oxygen desaturations.   Recommendations   Recommend CPAP therapy for mild obstructive sleep apnea with significant daytime symptoms.  Treatment options are CPAP with the settings auto titrating, 5-15 cm H2O, this will be appropriate.  Close clinical follow up with compliance monitoring to optimize therapeutic efficiency.  Weight loss measures encouraged.  Advise against driving while sleepy & against medication with sedative side effects.   Called patient, verified identity, reviewed impressions and recommendations. Orders placed for new CPAP, follow up recall placed to see Dr. Ander Slade in 3 months.   Make appointment for 3 months for compliance with download with Dr. Ander Slade.

## 2020-03-08 ENCOUNTER — Ambulatory Visit: Payer: BC Managed Care – PPO | Admitting: Professional

## 2020-03-13 ENCOUNTER — Ambulatory Visit: Payer: BC Managed Care – PPO | Admitting: Pulmonary Disease

## 2020-03-17 NOTE — Telephone Encounter (Signed)
This was handled in phone encounter from 5/13. Nothing further needed.

## 2020-03-17 NOTE — Telephone Encounter (Signed)
Please advise on HST for this patient. I don't see any information.

## 2020-03-17 NOTE — Telephone Encounter (Signed)
Documentation on 5/13 showed that she was called with sleep study result by [facility], also noted in the same notes that CPAP was ordered

## 2020-03-20 ENCOUNTER — Other Ambulatory Visit: Payer: Self-pay

## 2020-03-20 ENCOUNTER — Ambulatory Visit (INDEPENDENT_AMBULATORY_CARE_PROVIDER_SITE_OTHER): Payer: BC Managed Care – PPO | Admitting: Bariatrics

## 2020-03-20 ENCOUNTER — Encounter (INDEPENDENT_AMBULATORY_CARE_PROVIDER_SITE_OTHER): Payer: Self-pay | Admitting: Bariatrics

## 2020-03-20 VITALS — BP 124/77 | HR 73 | Temp 98.7°F | Ht 60.0 in | Wt 221.0 lb

## 2020-03-20 DIAGNOSIS — F3289 Other specified depressive episodes: Secondary | ICD-10-CM | POA: Diagnosis not present

## 2020-03-20 DIAGNOSIS — E559 Vitamin D deficiency, unspecified: Secondary | ICD-10-CM

## 2020-03-20 DIAGNOSIS — Z6841 Body Mass Index (BMI) 40.0 and over, adult: Secondary | ICD-10-CM

## 2020-03-20 MED ORDER — VITAMIN D (ERGOCALCIFEROL) 1.25 MG (50000 UNIT) PO CAPS: 50000.0000 [IU] | ORAL_CAPSULE | ORAL | 0 refills | Status: DC

## 2020-03-20 NOTE — Progress Notes (Signed)
Chief Complaint:   OBESITY Alison Best is here to discuss her progress with her obesity treatment plan along with follow-up of her obesity related diagnoses. Alison Best is on the Category 2 Plan and states she is following her eating plan approximately 30% of the time. Byron states she is doing PT exercises 10 minutes 7 times per week.  Today's visit was #: 25 Starting weight: 227 lbs Starting date: 01/11/2019 Today's weight: 221 lbs Today's date: 03/20/2020 Total lbs lost to date: 6 Total lbs lost since last in-office visit: 1  Interim History: Alison Best is down 1 lb, but her in-laws are still in town and she is eating with them. She reports doing well with her water intake.  Subjective:   Vitamin D deficiency. No nausea, vomiting, or muscle weakness. Last Vitamin D 59.61 on 10/05/2019.  Other depression, with emotional eating. Alison Best is struggling with emotional eating and using food for comfort to the extent that it is negatively impacting her health. She has been working on behavior modification techniques to help reduce her emotional eating and has been somewhat successful. She shows no sign of suicidal or homicidal ideations. Alison Best had been on Topamax previously. She was prescribed Wellbutrin and had not started at her last visit.  Assessment/Plan:   Vitamin D deficiency. Low Vitamin D level contributes to fatigue and are associated with obesity, breast, and colon cancer. She was given a prescription for Vitamin D, Ergocalciferol, (DRISDOL) 1.25 MG (50000 UNIT) CAPS capsule every week #4 with 0 refills and will follow-up for routine testing of Vitamin D, at least 2-3 times per year to avoid over-replacement.   Other depression, with emotional eating. Behavior modification techniques were discussed today to help Alison Best deal with her emotional/non-hunger eating behaviors.  Orders and follow up as documented in patient record. Will discuss Wellbutrin at her next visit.  Class 3  severe obesity due to excess calories with serious comorbidity and body mass index (BMI) of 40.0 to 44.9 in adult [facility]).  Alison Best is currently in the action stage of change. As such, her goal is to continue with weight loss efforts. She has agreed to the Category 2 Plan.   She will work on meal planning, intentional eating, and portion control. Handout was given on Protein Equivalents.  Exercise goals: Alison Best will continue her PT exercises as directed.  Behavioral modification strategies: increasing lean protein intake, decreasing simple carbohydrates, increasing vegetables, increasing water intake, decreasing eating out, no skipping meals, meal planning and cooking strategies and keeping healthy foods in the home.  Alison Best has agreed to follow-up with our clinic in 3-4 weeks. She was informed of the importance of frequent follow-up visits to maximize her success with intensive lifestyle modifications for her multiple health conditions.   Objective:   Blood pressure 124/77, pulse 73, temperature 98.7 F (37.1 C), temperature source Oral, height 5' (1.524 m), weight 221 lb (100.2 kg), last menstrual period 03/22/2012, SpO2 95 %. Body mass index is 43.16 kg/m.  General: Cooperative, alert, well developed, in no acute distress. HEENT: Conjunctivae and lids unremarkable. Cardiovascular: Regular rhythm.  Lungs: Normal work of breathing. Neurologic: No focal deficits.   Lab Results  Component Value Date   CREATININE 0.86 10/05/2019   BUN 15 10/05/2019   NA 143 10/05/2019   K 4.4 10/05/2019   CL 109 10/05/2019   CO2 23 10/05/2019   Lab Results  Component Value Date   ALT 23 10/05/2019   AST 17 10/05/2019   ALKPHOS  84 01/11/2019   BILITOT 0.4 10/05/2019   Lab Results  Component Value Date   HGBA1C 6.0 10/05/2019   HGBA1C 6.2 (A) 03/23/2019   HGBA1C 5.7 (A) 09/20/2018   HGBA1C 5.5 09/30/2017   HGBA1C 6.3 04/01/2017   Lab Results  Component Value Date   INSULIN 21.2 01/11/2019    Lab Results  Component Value Date   TSH 2.88 10/05/2019   Lab Results  Component Value Date   CHOL 106 10/05/2019   HDL 23.70 (L) 10/05/2019   LDLCALC 93 11/02/2011   LDLDIRECT 29.0 10/05/2019   TRIG 211.0 (H) 10/05/2019   CHOLHDL 4 10/05/2019   Lab Results  Component Value Date   WBC 9.5 11/16/2017   HGB 14.6 11/16/2017   HCT 43.4 11/16/2017   MCV 92.5 11/16/2017   PLT 288.0 11/16/2017   No results found for: IRON, TIBC, FERRITIN  Attestation Statements:   Reviewed by clinician on day of visit: allergies, medications, problem list, medical history, surgical history, family history, social history, and previous encounter notes.  Migdalia Dk, am acting as Location manager for [employer], DO   I have reviewed the above documentation for accuracy and completeness, and I agree with the above. Jearld Lesch, DO

## 2020-03-25 ENCOUNTER — Encounter (INDEPENDENT_AMBULATORY_CARE_PROVIDER_SITE_OTHER): Payer: Self-pay | Admitting: Bariatrics

## 2020-03-28 ENCOUNTER — Ambulatory Visit (INDEPENDENT_AMBULATORY_CARE_PROVIDER_SITE_OTHER): Payer: BC Managed Care – PPO | Admitting: Professional

## 2020-03-28 DIAGNOSIS — F4321 Adjustment disorder with depressed mood: Secondary | ICD-10-CM

## 2020-04-01 ENCOUNTER — Other Ambulatory Visit: Payer: Self-pay

## 2020-04-01 MED ORDER — DILTIAZEM HCL ER COATED BEADS 120 MG PO CP24: ORAL_CAPSULE | ORAL | 2 refills | Status: DC

## 2020-04-02 ENCOUNTER — Telehealth: Payer: BC Managed Care – PPO | Admitting: Nurse Practitioner

## 2020-04-02 DIAGNOSIS — J0100 Acute maxillary sinusitis, unspecified: Secondary | ICD-10-CM | POA: Diagnosis not present

## 2020-04-02 MED ORDER — AMOXICILLIN-POT CLAVULANATE 875-125 MG PO TABS
1.0000 | ORAL_TABLET | Freq: Two times a day (BID) | ORAL | 0 refills | Status: DC
Start: 2020-04-02 — End: 2020-06-05

## 2020-04-02 NOTE — Progress Notes (Signed)

## 2020-04-16 ENCOUNTER — Other Ambulatory Visit: Payer: Self-pay

## 2020-04-16 ENCOUNTER — Ambulatory Visit (INDEPENDENT_AMBULATORY_CARE_PROVIDER_SITE_OTHER): Payer: BC Managed Care – PPO | Admitting: Bariatrics

## 2020-04-16 VITALS — BP 120/76 | HR 71 | Temp 97.8°F | Ht 60.0 in | Wt 224.0 lb

## 2020-04-16 DIAGNOSIS — E7849 Other hyperlipidemia: Secondary | ICD-10-CM | POA: Diagnosis not present

## 2020-04-16 DIAGNOSIS — E559 Vitamin D deficiency, unspecified: Secondary | ICD-10-CM | POA: Diagnosis not present

## 2020-04-16 DIAGNOSIS — Z9189 Other specified personal risk factors, not elsewhere classified: Secondary | ICD-10-CM | POA: Diagnosis not present

## 2020-04-16 DIAGNOSIS — Z6841 Body Mass Index (BMI) 40.0 and over, adult: Secondary | ICD-10-CM

## 2020-04-16 MED ORDER — VITAMIN D (ERGOCALCIFEROL) 1.25 MG (50000 UNIT) PO CAPS: ORAL_CAPSULE | ORAL | 0 refills | Status: DC

## 2020-04-16 NOTE — Progress Notes (Signed)
Chief Complaint:   OBESITY Alison Best is here to discuss her progress with her obesity treatment plan along with follow-up of her obesity related diagnoses. Alison Best is on the Category 2 Plan and states she is following her eating plan approximately 25% of the time. Alison Best states she is walking, moving, and packing 8 hours 2-3 times per week.  Today's visit was #: 36 Starting weight: 227 lbs Starting date: 01/11/2019 Today's weight: 224 lbs Today's date: 04/16/2020 Total lbs lost to date: 3 Total lbs lost since last in-office visit: 0  Interim History: Alison Best is up 3 lbs. She reports being under a lot of stress. The bioimpedance scale notes she is up 1.5 lbs of water weight.  Subjective:   Vitamin D deficiency. No nausea, vomiting, or muscle weakness. Last Vitamin D was 59.61 on 10/05/2019.  Other hyperlipidemia. Alison Best is taking Lipitor and Vascepa.  Lab Results  Component Value Date   CHOL 106 10/05/2019   HDL 23.70 (L) 10/05/2019   LDLCALC 93 11/02/2011   LDLDIRECT 29.0 10/05/2019   TRIG 211.0 (H) 10/05/2019   CHOLHDL 4 10/05/2019   Lab Results  Component Value Date   ALT 23 10/05/2019   AST 17 10/05/2019   ALKPHOS 84 01/11/2019   BILITOT 0.4 10/05/2019   The ASCVD Risk score [name], et al., 2013) failed to calculate for the following reasons:   The valid total cholesterol range is 130 to 320 mg/dL  At risk for osteoporosis. Alison Best is at higher risk of osteopenia and osteoporosis due to age and activity.   Assessment/Plan:   Vitamin D deficiency. Low Vitamin D level contributes to fatigue and are associated with obesity, breast, and colon cancer. She was given a prescription for Vitamin D, Ergocalciferol, (DRISDOL) 1.25 MG (50000 UNIT) CAPS capsule every other week #2 with 0 refills and will follow-up for routine testing of Vitamin D, at least 2-3 times per year to avoid over-replacement.   Other hyperlipidemia. Cardiovascular risk and specific  lipid/LDL goals reviewed.  We discussed several lifestyle modifications today and Alison Best will continue to work on diet, exercise and weight loss efforts. Orders and follow up as documented in patient record. She will continue her medications as directed.  Counseling Intensive lifestyle modifications are the first line treatment for this issue. . Dietary changes: Increase soluble fiber. Decrease simple carbohydrates. . Exercise changes: Moderate to vigorous-intensity aerobic activity 150 minutes per week if tolerated. . Lipid-lowering medications: see documented in medical record.  At risk for osteoporosis. Alison Best was given approximately 15 minutes of osteoporosis prevention counseling today. Alison Best is at risk for osteopenia and osteoporosis due to her Vitamin D deficiency. She was encouraged to take her Vitamin D and follow her higher calcium diet and increase strengthening exercise to help strengthen her bones and decrease her risk of osteopenia and osteoporosis.  Repetitive spaced learning was employed today to elicit superior memory formation and behavioral change.  Class 3 severe obesity due to excess calories with serious comorbidity and body mass index (BMI) of 40.0 to 44.9 in adult [facility]).  Alison Best is currently in the action stage of change. As such, her goal is to continue with weight loss efforts. She has agreed to the Category 2 Plan.   She will increase her water intake, meal planning, and will do resistance band exercises.  Exercise goals: Alison Best will continue walking and moving for 8 hours 2-3 times per week.  Behavioral modification strategies: increasing lean protein intake, decreasing simple  carbohydrates, increasing vegetables, increasing water intake, decreasing eating out, no skipping meals, meal planning and cooking strategies, keeping healthy foods in the home and planning for success.  Alison Best has agreed to follow-up with our clinic in 2 weeks. She was informed of the importance of  frequent follow-up visits to maximize her success with intensive lifestyle modifications for her multiple health conditions.   Objective:   Blood pressure 120/76, pulse 71, temperature 97.8 F (36.6 C), height 5' (1.524 m), weight 224 lb (101.6 kg), last menstrual period 03/22/2012, SpO2 98 %. Body mass index is 43.75 kg/m.  General: Cooperative, alert, well developed, in no acute distress. HEENT: Conjunctivae and lids unremarkable. Cardiovascular: Regular rhythm.  Lungs: Normal work of breathing. Neurologic: No focal deficits.   Lab Results  Component Value Date   CREATININE 0.86 10/05/2019   BUN 15 10/05/2019   NA 143 10/05/2019   K 4.4 10/05/2019   CL 109 10/05/2019   CO2 23 10/05/2019   Lab Results  Component Value Date   ALT 23 10/05/2019   AST 17 10/05/2019   ALKPHOS 84 01/11/2019   BILITOT 0.4 10/05/2019   Lab Results  Component Value Date   HGBA1C 6.0 10/05/2019   HGBA1C 6.2 (A) 03/23/2019   HGBA1C 5.7 (A) 09/20/2018   HGBA1C 5.5 09/30/2017   HGBA1C 6.3 04/01/2017   Lab Results  Component Value Date   INSULIN 21.2 01/11/2019   Lab Results  Component Value Date   TSH 2.88 10/05/2019   Lab Results  Component Value Date   CHOL 106 10/05/2019   HDL 23.70 (L) 10/05/2019   LDLCALC 93 11/02/2011   LDLDIRECT 29.0 10/05/2019   TRIG 211.0 (H) 10/05/2019   CHOLHDL 4 10/05/2019   Lab Results  Component Value Date   WBC 9.5 11/16/2017   HGB 14.6 11/16/2017   HCT 43.4 11/16/2017   MCV 92.5 11/16/2017   PLT 288.0 11/16/2017   No results found for: IRON, TIBC, FERRITIN  Attestation Statements:   Reviewed by clinician on day of visit: allergies, medications, problem list, medical history, surgical history, family history, social history, and previous encounter notes.  Migdalia Dk, am acting as Location manager for [employer], DO   I have reviewed the above documentation for accuracy and completeness, and I agree with the above. Jearld Lesch, DO

## 2020-04-17 ENCOUNTER — Encounter (INDEPENDENT_AMBULATORY_CARE_PROVIDER_SITE_OTHER): Payer: Self-pay | Admitting: Bariatrics

## 2020-04-17 ENCOUNTER — Ambulatory Visit (INDEPENDENT_AMBULATORY_CARE_PROVIDER_SITE_OTHER): Payer: BC Managed Care – PPO | Admitting: Professional

## 2020-04-17 DIAGNOSIS — F4321 Adjustment disorder with depressed mood: Secondary | ICD-10-CM

## 2020-05-08 ENCOUNTER — Ambulatory Visit: Payer: BC Managed Care – PPO | Admitting: Podiatry

## 2020-05-15 ENCOUNTER — Ambulatory Visit (INDEPENDENT_AMBULATORY_CARE_PROVIDER_SITE_OTHER): Payer: BC Managed Care – PPO | Admitting: Professional

## 2020-05-15 ENCOUNTER — Ambulatory Visit (INDEPENDENT_AMBULATORY_CARE_PROVIDER_SITE_OTHER): Payer: BC Managed Care – PPO | Admitting: Bariatrics

## 2020-05-15 DIAGNOSIS — F4321 Adjustment disorder with depressed mood: Secondary | ICD-10-CM | POA: Diagnosis not present

## 2020-05-23 ENCOUNTER — Ambulatory Visit: Payer: BC Managed Care – PPO | Admitting: Podiatry

## 2020-05-27 ENCOUNTER — Ambulatory Visit (INDEPENDENT_AMBULATORY_CARE_PROVIDER_SITE_OTHER): Payer: BC Managed Care – PPO | Admitting: Bariatrics

## 2020-06-01 DIAGNOSIS — Z20828 Contact with and (suspected) exposure to other viral communicable diseases: Secondary | ICD-10-CM | POA: Diagnosis not present

## 2020-06-05 ENCOUNTER — Encounter (INDEPENDENT_AMBULATORY_CARE_PROVIDER_SITE_OTHER): Payer: Self-pay | Admitting: Bariatrics

## 2020-06-05 ENCOUNTER — Other Ambulatory Visit: Payer: Self-pay

## 2020-06-05 ENCOUNTER — Ambulatory Visit (INDEPENDENT_AMBULATORY_CARE_PROVIDER_SITE_OTHER): Payer: BC Managed Care – PPO | Admitting: Bariatrics

## 2020-06-05 VITALS — BP 143/85 | HR 77 | Temp 97.6°F | Ht 60.0 in | Wt 221.0 lb

## 2020-06-05 DIAGNOSIS — Z9189 Other specified personal risk factors, not elsewhere classified: Secondary | ICD-10-CM | POA: Diagnosis not present

## 2020-06-05 DIAGNOSIS — E7849 Other hyperlipidemia: Secondary | ICD-10-CM | POA: Diagnosis not present

## 2020-06-05 DIAGNOSIS — E559 Vitamin D deficiency, unspecified: Secondary | ICD-10-CM | POA: Diagnosis not present

## 2020-06-05 DIAGNOSIS — F5089 Other specified eating disorder: Secondary | ICD-10-CM

## 2020-06-05 DIAGNOSIS — Z6841 Body Mass Index (BMI) 40.0 and over, adult: Secondary | ICD-10-CM

## 2020-06-05 MED ORDER — VITAMIN D (ERGOCALCIFEROL) 1.25 MG (50000 UNIT) PO CAPS: ORAL_CAPSULE | ORAL | 0 refills | Status: DC

## 2020-06-05 MED ORDER — BUPROPION HCL ER (SR) 100 MG PO TB12: 100.0000 mg | ORAL_TABLET | Freq: Every day | ORAL | 0 refills | Status: DC

## 2020-06-10 ENCOUNTER — Encounter (INDEPENDENT_AMBULATORY_CARE_PROVIDER_SITE_OTHER): Payer: Self-pay | Admitting: Bariatrics

## 2020-06-10 NOTE — Progress Notes (Signed)
**Note De-Identified Alison Best Obfuscation** Chief Complaint:   OBESITY Alison Best is here to discuss her progress with her obesity treatment plan along with follow-up of her obesity related diagnoses. Alison Best is on the Category 2 Plan and states she is following her eating plan approximately 30% of the time. [name] states she is walking 10 minutes 7 times per week.  Today's visit was #: 30 Starting weight: 227 lbs Starting date: 01/11/2019 Today's weight: 221 lbs Today's date: 06/05/2020 Total lbs lost to date: 6 Total lbs lost since last in-office visit: 3  Interim History: Alison Best is down an additional 3 lbs since her last visit. She states she has been craving more carbohydrates.  Subjective:   Vitamin D deficiency. No nausea, vomiting, or muscle weakness.    Ref. Range 10/05/2019 08:49  VITD Latest Ref Range: 30.00 - 100.00 ng/mL 59.61   Other hyperlipidemia. Memori is taking Lipitor and Vascepa.   Lab Results  Component Value Date   CHOL 106 10/05/2019   HDL 23.70 (L) 10/05/2019   LDLCALC 93 11/02/2011   LDLDIRECT 29.0 10/05/2019   TRIG 211.0 (H) 10/05/2019   CHOLHDL 4 10/05/2019   Lab Results  Component Value Date   ALT 23 10/05/2019   AST 17 10/05/2019   ALKPHOS 84 01/11/2019   BILITOT 0.4 10/05/2019   The ASCVD Risk score [name], et al., 2013) failed to calculate for the following reasons:   The valid total cholesterol range is 130 to 320 mg/dL  Other disorder of eating. Adana endorses carbohydrate cravings.  At risk for osteoporosis. Shainna is at higher risk of osteopenia and osteoporosis due to Vitamin D deficiency.   Assessment/Plan:   Vitamin D deficiency. Low Vitamin D level contributes to fatigue and are associated with obesity, breast, and colon cancer. She was given a prescription for Vitamin D, Ergocalciferol, (DRISDOL) 1.25 MG (50000 UNIT) CAPS capsule every other week #2 with 0 refills and will follow-up for routine testing of Vitamin D, at least 2-3 times per year to avoid  over-replacement.   Other hyperlipidemia. Cardiovascular risk and specific lipid/LDL goals reviewed.  We discussed several lifestyle modifications today and Corazon will continue to work on diet, exercise and weight loss efforts. Orders and follow up as documented in patient record. She will continue her medications as directed.   Counseling Intensive lifestyle modifications are the first line treatment for this issue.  Dietary changes: Increase soluble fiber. Decrease simple carbohydrates.  Exercise changes: Moderate to vigorous-intensity aerobic activity 150 minutes per week if tolerated.  Lipid-lowering medications: see documented in medical record.  Other disorder of eating. Prescription was given for buPROPion (WELLBUTRIN SR) 100 MG 12 hr tablet 1 PO daily #30 with 0 refills.  At risk for osteoporosis. Alison Best was given approximately 15 minutes of osteoporosis prevention counseling today. Alison Best is at risk for osteopenia and osteoporosis due to her Vitamin D deficiency. She was encouraged to take her Vitamin D and follow her higher calcium diet and increase strengthening exercise to help strengthen her bones and decrease her risk of osteopenia and osteoporosis.  Repetitive spaced learning was employed today to elicit superior memory formation and behavioral change.  Class 3 severe obesity due to excess calories with serious comorbidity and body mass index (BMI) of 40.0 to 44.9 in adult [facility]).  [name] is currently in the action stage of change. As such, her goal is to continue with weight loss efforts. She has agreed to the Category 2 Plan.   She will work  on meal planning and intentional eating.   Exercise goals: All adults should avoid inactivity. Some physical activity is better than none, and adults who participate in any amount of physical activity gain some health benefits.  Behavioral modification strategies: increasing lean protein intake, decreasing simple carbohydrates, increasing  vegetables, increasing water intake, decreasing eating out, no skipping meals, meal planning and cooking strategies, keeping healthy foods in the home and planning for success.  Alison Best has agreed to follow-up with our clinic fasting in 2 weeks. She was informed of the importance of frequent follow-up visits to maximize her success with intensive lifestyle modifications for her multiple health conditions.   Objective:   Blood pressure (!) 143/85, pulse 77, temperature 97.6 F (36.4 C), height 5' (1.524 m), weight 221 lb (100.2 kg), last menstrual period 03/22/2012, SpO2 99 %. Body mass index is 43.16 kg/m.  General: Cooperative, alert, well developed, in no acute distress. HEENT: Conjunctivae and lids unremarkable. Cardiovascular: Regular rhythm.  Lungs: Normal work of breathing. Neurologic: No focal deficits.   Lab Results  Component Value Date   CREATININE 0.86 10/05/2019   BUN 15 10/05/2019   NA 143 10/05/2019   K 4.4 10/05/2019   CL 109 10/05/2019   CO2 23 10/05/2019   Lab Results  Component Value Date   ALT 23 10/05/2019   AST 17 10/05/2019   ALKPHOS 84 01/11/2019   BILITOT 0.4 10/05/2019   Lab Results  Component Value Date   HGBA1C 6.0 10/05/2019   HGBA1C 6.2 (A) 03/23/2019   HGBA1C 5.7 (A) 09/20/2018   HGBA1C 5.5 09/30/2017   HGBA1C 6.3 04/01/2017   Lab Results  Component Value Date   INSULIN 21.2 01/11/2019   Lab Results  Component Value Date   TSH 2.88 10/05/2019   Lab Results  Component Value Date   CHOL 106 10/05/2019   HDL 23.70 (L) 10/05/2019   LDLCALC 93 11/02/2011   LDLDIRECT 29.0 10/05/2019   TRIG 211.0 (H) 10/05/2019   CHOLHDL 4 10/05/2019   Lab Results  Component Value Date   WBC 9.5 11/16/2017   HGB 14.6 11/16/2017   HCT 43.4 11/16/2017   MCV 92.5 11/16/2017   PLT 288.0 11/16/2017   No results found for: IRON, TIBC, FERRITIN  Attestation Statements:   Reviewed by clinician on day of visit: allergies, medications, problem list,  medical history, surgical history, family history, social history, and previous encounter notes.  Migdalia Dk, am acting as Location manager for [employer], DO   I have reviewed the above documentation for accuracy and completeness, and I agree with the above. Jearld Lesch, DO

## 2020-06-13 ENCOUNTER — Ambulatory Visit: Payer: BC Managed Care – PPO | Admitting: Professional

## 2020-06-15 ENCOUNTER — Encounter (INDEPENDENT_AMBULATORY_CARE_PROVIDER_SITE_OTHER): Payer: Self-pay | Admitting: Bariatrics

## 2020-06-16 NOTE — Telephone Encounter (Signed)
Please review

## 2020-06-26 ENCOUNTER — Ambulatory Visit (INDEPENDENT_AMBULATORY_CARE_PROVIDER_SITE_OTHER): Payer: BC Managed Care – PPO | Admitting: Bariatrics

## 2020-06-26 ENCOUNTER — Encounter (INDEPENDENT_AMBULATORY_CARE_PROVIDER_SITE_OTHER): Payer: Self-pay | Admitting: Bariatrics

## 2020-06-26 ENCOUNTER — Other Ambulatory Visit: Payer: Self-pay

## 2020-06-26 VITALS — BP 136/83 | HR 76 | Temp 98.0°F | Ht 60.0 in | Wt 218.0 lb

## 2020-06-26 DIAGNOSIS — E559 Vitamin D deficiency, unspecified: Secondary | ICD-10-CM | POA: Diagnosis not present

## 2020-06-26 DIAGNOSIS — Z9189 Other specified personal risk factors, not elsewhere classified: Secondary | ICD-10-CM | POA: Diagnosis not present

## 2020-06-26 DIAGNOSIS — M1990 Unspecified osteoarthritis, unspecified site: Secondary | ICD-10-CM | POA: Diagnosis not present

## 2020-06-26 DIAGNOSIS — R5383 Other fatigue: Secondary | ICD-10-CM

## 2020-06-26 DIAGNOSIS — E7849 Other hyperlipidemia: Secondary | ICD-10-CM

## 2020-06-26 DIAGNOSIS — R7303 Prediabetes: Secondary | ICD-10-CM

## 2020-06-26 DIAGNOSIS — Z6841 Body Mass Index (BMI) 40.0 and over, adult: Secondary | ICD-10-CM

## 2020-06-26 MED ORDER — VITAMIN D (ERGOCALCIFEROL) 1.25 MG (50000 UNIT) PO CAPS: ORAL_CAPSULE | ORAL | 0 refills | Status: DC

## 2020-06-26 MED ORDER — DICLOFENAC SODIUM 75 MG PO TBEC: 75.0000 mg | DELAYED_RELEASE_TABLET | Freq: Two times a day (BID) | ORAL | 0 refills | Status: DC

## 2020-06-26 NOTE — Progress Notes (Signed)
Chief Complaint:   OBESITY Alison Best is here to discuss her progress with her obesity treatment plan along with follow-up of her obesity related diagnoses. Alison Best is on the Category 2 Plan and states she is following her eating plan approximately 40% of the time. Alison Best states she is doing PT 10 minutes 7 times per week.  Today's visit was #: 28 Starting weight: 227 lbs Starting date: 01/11/2019 Today's weight: 218 lbs Today's date: 06/26/2020 Total lbs lost to date: 9 Total lbs lost since last in-office visit: 3  Interim History: Alison Best is down 3 lbs. She is doing a text support group with several friends.  Subjective:   Vitamin D deficiency. No nausea, vomiting, or muscle weakness.    Ref. Range 10/05/2019 08:49  VITD Latest Ref Range: 30.00 - 100.00 ng/mL 59.61   Other type of osteoarthritis, unspecified site. Alison Best reports having pain in several joints.  Fatigue, unspecified type. Alison Best denies heat or cold sensitivity. She reports energy is slightly decreased in the afternoon.  Prediabetes. Alison Best has a diagnosis of prediabetes based on her elevated HgA1c and was informed this puts her at greater risk of developing diabetes. She continues to work on diet and exercise to decrease her risk of diabetes. She denies nausea or hypoglycemia.  Lab Results  Component Value Date   HGBA1C 6.0 10/05/2019   Lab Results  Component Value Date   INSULIN 21.2 01/11/2019   Other hyperlipidemia.  Alison Best denies myalgias.  Lab Results  Component Value Date   CHOL 106 10/05/2019   HDL 23.70 (L) 10/05/2019   LDLCALC 93 11/02/2011   LDLDIRECT 29.0 10/05/2019   TRIG 211.0 (H) 10/05/2019   CHOLHDL 4 10/05/2019   Lab Results  Component Value Date   ALT 23 10/05/2019   AST 17 10/05/2019   ALKPHOS 84 01/11/2019   BILITOT 0.4 10/05/2019   The ASCVD Risk score Alison Bussing DC Jr., et al., 2013) failed to calculate for the following reasons:   The valid total cholesterol range is 130 to  320 mg/dL  At risk for heart disease. Alison Best is at a higher than average risk for cardiovascular disease due to hyperlipidemia.   Assessment/Plan:   Vitamin D deficiency. Low Vitamin D level contributes to fatigue and are associated with obesity, breast, and colon cancer. She was given a prescription for Vitamin D, Ergocalciferol, (DRISDOL) 1.25 MG (50000 UNIT) CAPS capsule every week #4 with 0 refills and VITAMIN D 25 Hydroxy (Vit-D Deficiency, Fractures) level will be checked today.   Other type of osteoarthritis, unspecified site. Prescription was given for diclofenac (VOLTAREN) 75 MG EC tablet 1 BID PRN #60 with 0 refills.  Fatigue, unspecified type. Fatigue may be related to obesity, depression or many other causes. Labs will be ordered, and in the meanwhile, Alison Best will focus on self care including making healthy food choices, increasing physical activity and focusing on stress reduction. Alison Best+T4F+T3Free levels will be checked today.   Prediabetes. Alison Best will continue to work on weight loss, exercise, and decreasing simple carbohydrates to help decrease the risk of diabetes. Insulin, random, Hemoglobin A1c levels will be checked today.   Other hyperlipidemia. Cardiovascular risk and specific lipid/LDL goals reviewed.  We discussed several lifestyle modifications today and Alison Best will continue to work on diet, exercise and weight loss efforts. Orders and follow up as documented in patient record. Lipid panel will be checked today.  Counseling Intensive lifestyle modifications are the first line treatment for this issue. . Dietary changes:  Increase soluble fiber. Decrease simple carbohydrates. . Exercise changes: Moderate to vigorous-intensity aerobic activity 150 minutes per week if tolerated.  . Lipid-lowering medications: see documented in medical record.  At risk for heart disease. Alison Best was given approximately 15 minutes of coronary artery disease prevention counseling today. She is 64 y.o.  female and has risk factors for heart disease including obesity. We discussed intensive lifestyle modifications today with an emphasis on specific weight loss instructions and strategies.   Repetitive spaced learning was employed today to elicit superior memory formation and behavioral change.  Class 3 severe obesity with serious comorbidity and body mass index (BMI) of 40.0 to 44.9 in adult, unspecified obesity type (Alison Best).  Alison Best is currently in the action stage of change. As such, her goal is to continue with weight loss efforts. She has agreed to the Category 2 Plan.   She will work on meal planning, intentional eating, and increasing her water and protein intake.   Exercise goals: All adults should avoid inactivity. Some physical activity is better than none, and adults who participate in any amount of physical activity gain some health benefits.  Behavioral modification strategies: increasing lean protein intake, decreasing simple carbohydrates, increasing vegetables, increasing water intake, decreasing eating out, no skipping meals, meal planning and cooking strategies, keeping healthy foods in the home and planning for success.  Alison Best has agreed to follow-up with our clinic in 2-3 weeks. She was informed of the importance of frequent follow-up visits to maximize her success with intensive lifestyle modifications for her multiple health conditions.   Alison Best was informed we would discuss her lab results at her next visit unless there is a critical issue that needs to be addressed sooner. Alison Best agreed to keep her next visit at the agreed upon time to discuss these results.  Objective:   Blood pressure 136/83, pulse 76, temperature 98 F (36.7 C), height 5' (1.524 m), weight 218 lb (98.9 kg), last menstrual period 03/22/2012, SpO2 98 %. Body mass index is 42.58 kg/m.  General: Cooperative, alert, well developed, in no acute distress. HEENT: Conjunctivae and lids unremarkable. Cardiovascular:  Regular rhythm.  Lungs: Normal work of breathing. Neurologic: No focal deficits.   Lab Results  Component Value Date   CREATININE 0.86 10/05/2019   BUN 15 10/05/2019   NA 143 10/05/2019   K 4.4 10/05/2019   CL 109 10/05/2019   CO2 23 10/05/2019   Lab Results  Component Value Date   ALT 23 10/05/2019   AST 17 10/05/2019   ALKPHOS 84 01/11/2019   BILITOT 0.4 10/05/2019   Lab Results  Component Value Date   HGBA1C 6.0 10/05/2019   HGBA1C 6.2 (A) 03/23/2019   HGBA1C 5.7 (A) 09/20/2018   HGBA1C 5.5 09/30/2017   HGBA1C 6.3 04/01/2017   Lab Results  Component Value Date   INSULIN 21.2 01/11/2019   Lab Results  Component Value Date   Alison Best 2.88 10/05/2019   Lab Results  Component Value Date   CHOL 106 10/05/2019   HDL 23.70 (L) 10/05/2019   LDLCALC 93 11/02/2011   LDLDIRECT 29.0 10/05/2019   TRIG 211.0 (H) 10/05/2019   CHOLHDL 4 10/05/2019   Lab Results  Component Value Date   WBC 9.5 11/16/2017   HGB 14.6 11/16/2017   HCT 43.4 11/16/2017   MCV 92.5 11/16/2017   PLT 288.0 11/16/2017   No results found for: IRON, TIBC, FERRITIN  Attestation Statements:   Reviewed by clinician on day of visit: allergies, medications, problem list, medical  history, surgical history, family history, social history, and previous encounter notes.  Migdalia Dk, am acting as Location manager for [employer], DO   I have reviewed the above documentation for accuracy and completeness, and I agree with the above. Jearld Lesch, DO

## 2020-06-27 LAB — LIPID PANEL WITH LDL/HDL RATIO
Cholesterol, Total: 138 mg/dL (ref 100–199)
HDL: 27 mg/dL — ABNORMAL LOW (ref >39)
LDL Chol Calc (NIH): 75 mg/dL (ref 0–99)
LDL/HDL Ratio: 2.8 ratio (ref 0.0–3.2)
Triglycerides: 218 mg/dL — ABNORMAL HIGH (ref 0–149)
VLDL Cholesterol Cal: 36 mg/dL (ref 5–40)

## 2020-06-27 LAB — COMPREHENSIVE METABOLIC PANEL
ALT: 35 IU/L — ABNORMAL HIGH (ref 0–32)
AST: 22 IU/L (ref 0–40)
Albumin/Globulin Ratio: 1.7 (ref 1.2–2.2)
Albumin: 4.3 g/dL (ref 3.8–4.8)
Alkaline Phosphatase: 100 IU/L (ref 48–121)
BUN/Creatinine Ratio: 19 (ref 12–28)
BUN: 16 mg/dL (ref 8–27)
Bilirubin Total: 0.3 mg/dL (ref 0.0–1.2)
CO2: 24 mmol/L (ref 20–29)
Calcium: 9.4 mg/dL (ref 8.7–10.3)
Chloride: 104 mmol/L (ref 96–106)
Creatinine, Ser: 0.86 mg/dL (ref 0.57–1.00)
GFR calc Af Amer: 84 mL/min/{1.73_m2} (ref >59)
GFR calc non Af Amer: 73 mL/min/{1.73_m2} (ref >59)
Globulin, Total: 2.6 g/dL (ref 1.5–4.5)
Glucose: 124 mg/dL — ABNORMAL HIGH (ref 65–99)
Potassium: 4.6 mmol/L (ref 3.5–5.2)
Sodium: 143 mmol/L (ref 134–144)
Total Protein: 6.9 g/dL (ref 6.0–8.5)

## 2020-06-27 LAB — TSH+T4F+T3FREE
Free T4: 1.07 ng/dL (ref 0.82–1.77)
T3, Free: 3.1 pg/mL (ref 2.0–4.4)
TSH: 3.77 u[IU]/mL (ref 0.450–4.500)

## 2020-06-27 LAB — VITAMIN D 25 HYDROXY (VIT D DEFICIENCY, FRACTURES): Vit D, 25-Hydroxy: 62.6 ng/mL (ref 30.0–100.0)

## 2020-06-27 LAB — INSULIN, RANDOM: INSULIN: 30.5 u[IU]/mL — ABNORMAL HIGH (ref 2.6–24.9)

## 2020-06-27 LAB — HEMOGLOBIN A1C
Est. average glucose Bld gHb Est-mCnc: 128 mg/dL
Hgb A1c MFr Bld: 6.1 % — ABNORMAL HIGH (ref 4.8–5.6)

## 2020-07-01 ENCOUNTER — Encounter (INDEPENDENT_AMBULATORY_CARE_PROVIDER_SITE_OTHER): Payer: Self-pay | Admitting: Bariatrics

## 2020-07-11 ENCOUNTER — Ambulatory Visit (INDEPENDENT_AMBULATORY_CARE_PROVIDER_SITE_OTHER): Payer: BC Managed Care – PPO | Admitting: Professional

## 2020-07-11 DIAGNOSIS — F4321 Adjustment disorder with depressed mood: Secondary | ICD-10-CM

## 2020-07-21 ENCOUNTER — Ambulatory Visit (INDEPENDENT_AMBULATORY_CARE_PROVIDER_SITE_OTHER): Payer: BC Managed Care – PPO | Admitting: Bariatrics

## 2020-07-21 ENCOUNTER — Other Ambulatory Visit: Payer: Self-pay

## 2020-07-21 ENCOUNTER — Encounter (INDEPENDENT_AMBULATORY_CARE_PROVIDER_SITE_OTHER): Payer: Self-pay | Admitting: Bariatrics

## 2020-07-21 VITALS — BP 137/85 | HR 70 | Temp 97.8°F | Ht 60.0 in | Wt 218.0 lb

## 2020-07-21 DIAGNOSIS — E559 Vitamin D deficiency, unspecified: Secondary | ICD-10-CM

## 2020-07-21 DIAGNOSIS — Z6841 Body Mass Index (BMI) 40.0 and over, adult: Secondary | ICD-10-CM

## 2020-07-21 DIAGNOSIS — E66813 Obesity, class 3: Secondary | ICD-10-CM

## 2020-07-21 DIAGNOSIS — E782 Mixed hyperlipidemia: Secondary | ICD-10-CM

## 2020-07-21 DIAGNOSIS — R7303 Prediabetes: Secondary | ICD-10-CM | POA: Diagnosis not present

## 2020-07-21 MED ORDER — VITAMIN D (ERGOCALCIFEROL) 1.25 MG (50000 UNIT) PO CAPS: 50000.0000 [IU] | ORAL_CAPSULE | ORAL | 0 refills | Status: DC

## 2020-07-22 NOTE — Progress Notes (Signed)
Chief Complaint:   OBESITY Alison Best is here to discuss her progress with her obesity treatment plan along with follow-up of her obesity related diagnoses. Alison Best is on the Category 2 Plan and states she is following her eating plan approximately 40% of the time. [name] states she is doing physical therapy for 10 minutes 6 times per week.  Today's visit was #: 65 Starting weight: 227 lbs Starting date: 01/11/2019 Today's weight: 218 lbs Today's date: 07/21/2020 Total lbs lost to date: 9 lbs Total lbs lost since last in-office visit: 0  Interim History: Alison Best's weight remains the same.  She is doing okay with her water.  Subjective:   1. Vitamin D deficiency Alison Best's Vitamin D level was 62.6 on 06/26/2020. She is currently taking prescription vitamin D 50,000 IU every 2 weeks. She denies nausea, vomiting or muscle weakness.  2. Elevated triglycerides with high cholesterol She had been on Vascepa, but stopped due to side effects.  Lab Results  Component Value Date   ALT 35 (H) 06/26/2020   AST 22 06/26/2020   ALKPHOS 100 06/26/2020   BILITOT 0.3 06/26/2020   Lab Results  Component Value Date   CHOL 138 06/26/2020   HDL 27 (L) 06/26/2020   LDLCALC 75 06/26/2020   LDLDIRECT 29.0 10/05/2019   TRIG 218 (H) 06/26/2020   CHOLHDL 4 10/05/2019   3. Prediabetes Alison Best has a diagnosis of prediabetes based on her elevated HgA1c and was informed this puts her at greater risk of developing diabetes. She continues to work on diet and exercise to decrease her risk of diabetes. She denies nausea or hypoglycemia.    Lab Results  Component Value Date   HGBA1C 6.1 (H) 06/26/2020   Lab Results  Component Value Date   INSULIN 30.5 (H) 06/26/2020   INSULIN 21.2 01/11/2019   Assessment/Plan:   1. Vitamin D deficiency Low Vitamin D level contributes to fatigue and are associated with obesity, breast, and colon cancer. She agrees to continue to take prescription Vitamin D @50 ,000 IU every 2 weeks  and will follow-up for routine testing of Vitamin D, at least 2-3 times per year to avoid over-replacement.    -Refill Vitamin D, Ergocalciferol, (DRISDOL) 1.25 MG (50000 UNIT) CAPS capsule; Take 1 capsule (50,000 Units total) by mouth every 14 (fourteen) days.  Dispense: 2 capsule; Refill: 0  2. Elevated triglycerides with high cholesterol Cardiovascular risk and specific lipid/LDL goals reviewed.  We discussed several lifestyle modifications today and Ashaunti will continue to work on diet, exercise and weight loss efforts. Orders and follow up as documented in patient record.  Start back on fish oil.  Counseling Intensive lifestyle modifications are the first line treatment for this issue. . Dietary changes: Increase soluble fiber. Decrease simple carbohydrates. . Exercise changes: Moderate to vigorous-intensity aerobic activity 150 minutes per week if tolerated. . Lipid-lowering medications: see documented in medical record.  3. Prediabetes Alison Best will continue to work on weight loss, exercise, and decreasing simple carbohydrates to help decrease the risk of diabetes.  Continue to decrease carbohydrates and increasing healthy fats and proteins.   4. Class 3 severe obesity due to excess calories with serious comorbidity and body mass index (BMI) of 40.0 to 44.9 in adult [facility])  Alison Best is currently in the action stage of change. As such, her goal is to continue with weight loss efforts. She has agreed to the Category 2 Plan.   Exercise goals: For substantial health benefits, adults should do at least 150  minutes (2 hours and 30 minutes) a week of moderate-intensity, or 75 minutes (1 hour and 15 minutes) a week of vigorous-intensity aerobic physical activity, or an equivalent combination of moderate- and vigorous-intensity aerobic activity. Aerobic activity should be performed in episodes of at least 10 minutes, and preferably, it should be spread throughout the week.  Behavioral modification  strategies: increasing lean protein intake, decreasing simple carbohydrates, increasing vegetables, increasing water intake, decreasing eating out, no skipping meals, meal planning and cooking strategies, keeping healthy foods in the home and planning for success.  Alison Best has agreed to follow-up with our clinic in 4 weeks. She was informed of the importance of frequent follow-up visits to maximize her success with intensive lifestyle modifications for her multiple health conditions.   Objective:   Blood pressure 137/85, pulse 70, temperature 97.8 F (36.6 C), height 5' (1.524 m), weight 218 lb (98.9 kg), last menstrual period 03/22/2012, SpO2 97 %. Body mass index is 42.58 kg/m.  General: Cooperative, alert, well developed, in no acute distress. HEENT: Conjunctivae and lids unremarkable. Cardiovascular: Regular rhythm.  Lungs: Normal work of breathing. Neurologic: No focal deficits.   Lab Results  Component Value Date   CREATININE 0.86 06/26/2020   BUN 16 06/26/2020   NA 143 06/26/2020   K 4.6 06/26/2020   CL 104 06/26/2020   CO2 24 06/26/2020   Lab Results  Component Value Date   ALT 35 (H) 06/26/2020   AST 22 06/26/2020   ALKPHOS 100 06/26/2020   BILITOT 0.3 06/26/2020   Lab Results  Component Value Date   HGBA1C 6.1 (H) 06/26/2020   HGBA1C 6.0 10/05/2019   HGBA1C 6.2 (A) 03/23/2019   HGBA1C 5.7 (A) 09/20/2018   HGBA1C 5.5 09/30/2017   Lab Results  Component Value Date   INSULIN 30.5 (H) 06/26/2020   INSULIN 21.2 01/11/2019   Lab Results  Component Value Date   TSH 3.770 06/26/2020   Lab Results  Component Value Date   CHOL 138 06/26/2020   HDL 27 (L) 06/26/2020   LDLCALC 75 06/26/2020   LDLDIRECT 29.0 10/05/2019   TRIG 218 (H) 06/26/2020   CHOLHDL 4 10/05/2019   Lab Results  Component Value Date   WBC 9.5 11/16/2017   HGB 14.6 11/16/2017   HCT 43.4 11/16/2017   MCV 92.5 11/16/2017   PLT 288.0 11/16/2017   Attestation Statements:   Reviewed by  clinician on day of visit: allergies, medications, problem list, medical history, surgical history, family history, social history, and previous encounter notes.  I, Water quality scientist, CMA, am acting as Location manager for [employer], DO  I have reviewed the above documentation for accuracy and completeness, and I agree with the above. Jearld Lesch, DO

## 2020-07-23 ENCOUNTER — Encounter (INDEPENDENT_AMBULATORY_CARE_PROVIDER_SITE_OTHER): Payer: Self-pay | Admitting: Bariatrics

## 2020-07-25 ENCOUNTER — Telehealth: Payer: Self-pay

## 2020-07-25 NOTE — Telephone Encounter (Signed)
**Note De-Identified [name] Obfuscation** Following message received from Covermymeds: Glenette Perlow (Key: [id number])  Vascepa 1GM capsules  Form: [employer] control surveyor Form (CB)  Determination  Favorable  2 hours ago  Prior authorization for Alison Best has been approved.    I have notified [facility] of this approval.

## 2020-07-25 NOTE — Telephone Encounter (Signed)
**Note De-Identified Jyquan Kenley Obfuscation** I started a Vascepa PA through covermymeds: Key: [id number]

## 2020-07-30 ENCOUNTER — Other Ambulatory Visit: Payer: Self-pay | Admitting: Physician Assistant

## 2020-08-08 ENCOUNTER — Ambulatory Visit (INDEPENDENT_AMBULATORY_CARE_PROVIDER_SITE_OTHER): Payer: BC Managed Care – PPO | Admitting: Professional

## 2020-08-08 DIAGNOSIS — F4321 Adjustment disorder with depressed mood: Secondary | ICD-10-CM

## 2020-08-11 ENCOUNTER — Ambulatory Visit (INDEPENDENT_AMBULATORY_CARE_PROVIDER_SITE_OTHER): Payer: BC Managed Care – PPO | Admitting: Bariatrics

## 2020-08-25 ENCOUNTER — Other Ambulatory Visit: Payer: Self-pay

## 2020-08-25 ENCOUNTER — Encounter (INDEPENDENT_AMBULATORY_CARE_PROVIDER_SITE_OTHER): Payer: Self-pay | Admitting: Bariatrics

## 2020-08-25 ENCOUNTER — Ambulatory Visit (INDEPENDENT_AMBULATORY_CARE_PROVIDER_SITE_OTHER): Payer: BC Managed Care – PPO | Admitting: Bariatrics

## 2020-08-25 VITALS — BP 135/85 | HR 75 | Temp 98.5°F | Ht 60.0 in | Wt 218.0 lb

## 2020-08-25 DIAGNOSIS — M1990 Unspecified osteoarthritis, unspecified site: Secondary | ICD-10-CM | POA: Diagnosis not present

## 2020-08-25 DIAGNOSIS — R7303 Prediabetes: Secondary | ICD-10-CM

## 2020-08-25 DIAGNOSIS — Z6841 Body Mass Index (BMI) 40.0 and over, adult: Secondary | ICD-10-CM

## 2020-08-25 DIAGNOSIS — Z9189 Other specified personal risk factors, not elsewhere classified: Secondary | ICD-10-CM | POA: Diagnosis not present

## 2020-08-25 DIAGNOSIS — E559 Vitamin D deficiency, unspecified: Secondary | ICD-10-CM | POA: Diagnosis not present

## 2020-08-25 MED ORDER — DICLOFENAC SODIUM 75 MG PO TBEC: 75.0000 mg | DELAYED_RELEASE_TABLET | Freq: Two times a day (BID) | ORAL | 0 refills | Status: DC

## 2020-08-25 MED ORDER — VITAMIN D (ERGOCALCIFEROL) 1.25 MG (50000 UNIT) PO CAPS: ORAL_CAPSULE | ORAL | 0 refills | Status: DC

## 2020-08-26 ENCOUNTER — Encounter (INDEPENDENT_AMBULATORY_CARE_PROVIDER_SITE_OTHER): Payer: Self-pay | Admitting: Bariatrics

## 2020-08-26 NOTE — Progress Notes (Signed)
Chief Complaint:   OBESITY Alison Best is here to discuss her progress with her obesity treatment plan along with follow-up of her obesity related diagnoses. Alison Best is on the Category 2 Plan and states she is following her eating plan approximately 20% of the time. Alison Best states she is doing PT for her knee 10 minutes 7 times per week.  Today's visit was #: 79 Starting weight: 227 lbs Starting date: 01/11/2019 Today's weight: 218 lbs Today's date: 08/25/2020 Total lbs lost to date: 9 Total lbs lost since last in-office visit: 0  Interim History: Alison Best's weight remains the same. She has not been able to get it together.  Subjective:   Vitamin D deficiency. No nausea, vomiting, or muscle weakness.    Ref. Range 06/26/2020 08:56  Vitamin D, 25-Hydroxy Latest Ref Range: 30.0 - 100.0 ng/mL 62.6   Prediabetes. Alison Best has a diagnosis of prediabetes based on her elevated HgA1c and was informed this puts her at greater risk of developing diabetes. She continues to work on diet and exercise to decrease her risk of diabetes. She denies nausea or hypoglycemia. Alison Best is on no medication.  Lab Results  Component Value Date   HGBA1C 6.1 (H) 06/26/2020   Lab Results  Component Value Date   INSULIN 30.5 (H) 06/26/2020   INSULIN 21.2 01/11/2019   Other type of osteoarthritis, unspecified site. Alison Best states she takes Voltaren as needed.  At risk for diabetes mellitus. Alison Best is at higher than average risk for developing diabetes due to prediabetes.  Assessment/Plan:   Vitamin D deficiency. Low Vitamin D level contributes to fatigue and are associated with obesity, breast, and colon cancer. She was given a prescription for Vitamin D, Ergocalciferol, (DRISDOL) 1.25 MG (50000 UNIT) CAPS capsule every 14 days #2 with 0 refills and will follow-up for routine testing of Vitamin D, at least 2-3 times per year to avoid over-replacement.   Prediabetes. Alison Best will continue to work on weight loss,  exercise, increasing healthy fats and protein, and decreasing simple carbohydrates to help decrease the risk of diabetes.   Other type of osteoarthritis, unspecified site. Prescription was given for diclofenac (VOLTAREN) 75 MG EC tablet 1 tablet BID #60 with 0 refills.  At risk for diabetes mellitus. Alison Best was given approximately 15 minutes of diabetes education and counseling today. We discussed intensive lifestyle modifications today with an emphasis on weight loss as well as increasing exercise and decreasing simple carbohydrates in her diet. We also reviewed medication options with an emphasis on risk versus benefit of those discussed.   Repetitive spaced learning was employed today to elicit superior memory formation and behavioral change.  Class 3 severe obesity with serious comorbidity and body mass index (BMI) of 40.0 to 44.9 in adult, unspecified obesity type (Alison Best).  Alison Best is currently in the action stage of change. As such, her goal is to continue with weight loss efforts. She has agreed to the Category 2 Plan.   She will work on meal planning, intentional eating, increasing her water and protein intake, and cooking more.   Exercise goals: Alison Best will continue PT for her knee as scheduled.  Behavioral modification strategies: increasing lean protein intake, decreasing simple carbohydrates, increasing vegetables, increasing water intake, decreasing eating out, no skipping meals, meal planning and cooking strategies, keeping healthy foods in the home and planning for success.  Alison Best has agreed to follow-up with our clinic in 2-3 weeks. She was informed of the importance of frequent follow-up visits to maximize  her success with intensive lifestyle modifications for her multiple health conditions.   Objective:   Blood pressure 135/85, pulse 75, temperature 98.5 F (36.9 C), height 5' (1.524 m), weight 218 lb (98.9 kg), last menstrual period 03/22/2012, SpO2 97 %. Body mass index is 42.58  kg/m.  General: Cooperative, alert, well developed, in no acute distress. HEENT: Conjunctivae and lids unremarkable. Cardiovascular: Regular rhythm.  Lungs: Normal work of breathing. Neurologic: No focal deficits.   Lab Results  Component Value Date   CREATININE 0.86 06/26/2020   BUN 16 06/26/2020   NA 143 06/26/2020   K 4.6 06/26/2020   CL 104 06/26/2020   CO2 24 06/26/2020   Lab Results  Component Value Date   ALT 35 (H) 06/26/2020   AST 22 06/26/2020   ALKPHOS 100 06/26/2020   BILITOT 0.3 06/26/2020   Lab Results  Component Value Date   HGBA1C 6.1 (H) 06/26/2020   HGBA1C 6.0 10/05/2019   HGBA1C 6.2 (A) 03/23/2019   HGBA1C 5.7 (A) 09/20/2018   HGBA1C 5.5 09/30/2017   Lab Results  Component Value Date   INSULIN 30.5 (H) 06/26/2020   INSULIN 21.2 01/11/2019   Lab Results  Component Value Date   TSH 3.770 06/26/2020   Lab Results  Component Value Date   CHOL 138 06/26/2020   HDL 27 (L) 06/26/2020   LDLCALC 75 06/26/2020   LDLDIRECT 29.0 10/05/2019   TRIG 218 (H) 06/26/2020   CHOLHDL 4 10/05/2019   Lab Results  Component Value Date   WBC 9.5 11/16/2017   HGB 14.6 11/16/2017   HCT 43.4 11/16/2017   MCV 92.5 11/16/2017   PLT 288.0 11/16/2017   No results found for: IRON, TIBC, FERRITIN  Attestation Statements:   Reviewed by clinician on day of visit: allergies, medications, problem list, medical history, surgical history, family history, social history, and previous encounter notes.  Migdalia Dk, am acting as Location manager for [employer], DO   I have reviewed the above documentation for accuracy and completeness, and I agree with the above. Jearld Lesch, DO

## 2020-08-29 ENCOUNTER — Telehealth: Payer: Self-pay | Admitting: Cardiovascular Disease

## 2020-08-29 ENCOUNTER — Other Ambulatory Visit: Payer: Self-pay | Admitting: Physician Assistant

## 2020-08-29 MED ORDER — ICOSAPENT ETHYL 1 G PO CAPS
2.0000 g | ORAL_CAPSULE | Freq: Two times a day (BID) | ORAL | 1 refills | Status: DC
Start: 2020-08-29 — End: 2020-12-17

## 2020-08-29 NOTE — Telephone Encounter (Signed)
New Message:   Colletta Maryland would like to clarify the directions of the Vascepa.

## 2020-08-29 NOTE — Telephone Encounter (Signed)
Pt's medication was sent to pt's pharmacy as requested today. Confirmation received.

## 2020-08-29 NOTE — Telephone Encounter (Signed)
[place] and spoke with pharmacist Colletta Maryland to clarify directions of pt's medication Vascepa. Pharmacist verbalized understanding.

## 2020-08-29 NOTE — Telephone Encounter (Signed)
°*  STAT* If patient is at the pharmacy, call can be transferred to refill team.   1. Which medications need to be refilled? (please list name of each medication and dose if known) icosapent Ethyl (VASCEPA) 1 g capsule  2. Which pharmacy/location (including street and city if local pharmacy) is medication to be sent to? [facility] [place], [place] AT [address]  3. Do they need a 30 day or 90 day supply? [place]

## 2020-08-30 ENCOUNTER — Telehealth: Payer: BC Managed Care – PPO | Admitting: Nurse Practitioner

## 2020-08-30 DIAGNOSIS — J0101 Acute recurrent maxillary sinusitis: Secondary | ICD-10-CM

## 2020-08-30 MED ORDER — AMOXICILLIN-POT CLAVULANATE 875-125 MG PO TABS: 1.0000 | ORAL_TABLET | Freq: Two times a day (BID) | ORAL | 0 refills | Status: DC

## 2020-08-30 NOTE — Progress Notes (Signed)

## 2020-09-02 MED ORDER — DOXYCYCLINE HYCLATE 100 MG PO CAPS: 100.0000 mg | ORAL_CAPSULE | Freq: Two times a day (BID) | ORAL | 0 refills | Status: DC

## 2020-09-02 NOTE — Addendum Note (Signed)
Addended by: Abigail Butts on: 09/02/2020 08:38 AM   Modules accepted: Orders

## 2020-09-05 ENCOUNTER — Ambulatory Visit (INDEPENDENT_AMBULATORY_CARE_PROVIDER_SITE_OTHER): Payer: BC Managed Care – PPO | Admitting: Professional

## 2020-09-05 DIAGNOSIS — F4321 Adjustment disorder with depressed mood: Secondary | ICD-10-CM | POA: Diagnosis not present

## 2020-09-15 ENCOUNTER — Ambulatory Visit (INDEPENDENT_AMBULATORY_CARE_PROVIDER_SITE_OTHER): Payer: BC Managed Care – PPO | Admitting: Bariatrics

## 2020-09-28 ENCOUNTER — Other Ambulatory Visit: Payer: Self-pay | Admitting: Internal Medicine

## 2020-09-29 NOTE — Telephone Encounter (Signed)
Please advise 

## 2020-09-30 ENCOUNTER — Other Ambulatory Visit (INDEPENDENT_AMBULATORY_CARE_PROVIDER_SITE_OTHER): Payer: Self-pay | Admitting: Bariatrics

## 2020-09-30 ENCOUNTER — Other Ambulatory Visit: Payer: Self-pay | Admitting: Internal Medicine

## 2020-09-30 DIAGNOSIS — E0590 Thyrotoxicosis, unspecified without thyrotoxic crisis or storm: Secondary | ICD-10-CM

## 2020-09-30 DIAGNOSIS — M1990 Unspecified osteoarthritis, unspecified site: Secondary | ICD-10-CM

## 2020-09-30 NOTE — Telephone Encounter (Signed)
Dr.Brown 

## 2020-10-03 ENCOUNTER — Ambulatory Visit (INDEPENDENT_AMBULATORY_CARE_PROVIDER_SITE_OTHER): Payer: BC Managed Care – PPO | Admitting: Professional

## 2020-10-03 DIAGNOSIS — F4321 Adjustment disorder with depressed mood: Secondary | ICD-10-CM | POA: Diagnosis not present

## 2020-10-07 ENCOUNTER — Other Ambulatory Visit: Payer: Self-pay | Admitting: Obstetrics & Gynecology

## 2020-10-07 ENCOUNTER — Other Ambulatory Visit: Payer: Self-pay

## 2020-10-07 ENCOUNTER — Ambulatory Visit (INDEPENDENT_AMBULATORY_CARE_PROVIDER_SITE_OTHER): Payer: BC Managed Care – PPO

## 2020-10-07 DIAGNOSIS — Z13820 Encounter for screening for osteoporosis: Secondary | ICD-10-CM

## 2020-10-07 DIAGNOSIS — Z780 Asymptomatic menopausal state: Secondary | ICD-10-CM

## 2020-11-03 ENCOUNTER — Ambulatory Visit (INDEPENDENT_AMBULATORY_CARE_PROVIDER_SITE_OTHER): Payer: Self-pay | Admitting: Professional

## 2020-11-03 DIAGNOSIS — F4321 Adjustment disorder with depressed mood: Secondary | ICD-10-CM

## 2020-11-04 ENCOUNTER — Encounter: Payer: Self-pay | Admitting: Obstetrics & Gynecology

## 2020-11-04 ENCOUNTER — Other Ambulatory Visit: Payer: Self-pay

## 2020-11-10 ENCOUNTER — Ambulatory Visit: Payer: Self-pay | Admitting: Obstetrics & Gynecology

## 2020-11-18 ENCOUNTER — Ambulatory Visit (INDEPENDENT_AMBULATORY_CARE_PROVIDER_SITE_OTHER): Payer: Self-pay | Admitting: Professional

## 2020-11-18 DIAGNOSIS — F4321 Adjustment disorder with depressed mood: Secondary | ICD-10-CM

## 2020-12-16 ENCOUNTER — Ambulatory Visit (INDEPENDENT_AMBULATORY_CARE_PROVIDER_SITE_OTHER): Payer: BC Managed Care – PPO | Admitting: Professional

## 2020-12-16 DIAGNOSIS — F4321 Adjustment disorder with depressed mood: Secondary | ICD-10-CM | POA: Diagnosis not present

## 2020-12-17 ENCOUNTER — Other Ambulatory Visit: Payer: Self-pay

## 2020-12-17 ENCOUNTER — Ambulatory Visit (INDEPENDENT_AMBULATORY_CARE_PROVIDER_SITE_OTHER): Payer: BC Managed Care – PPO | Admitting: Obstetrics & Gynecology

## 2020-12-17 ENCOUNTER — Encounter: Payer: Self-pay | Admitting: Obstetrics & Gynecology

## 2020-12-17 VITALS — BP 128/82 | HR 84 | Ht 59.45 in | Wt 223.0 lb

## 2020-12-17 DIAGNOSIS — F17290 Nicotine dependence, other tobacco product, uncomplicated: Secondary | ICD-10-CM | POA: Diagnosis not present

## 2020-12-17 DIAGNOSIS — Z6841 Body Mass Index (BMI) 40.0 and over, adult: Secondary | ICD-10-CM

## 2020-12-17 DIAGNOSIS — Z780 Asymptomatic menopausal state: Secondary | ICD-10-CM | POA: Diagnosis not present

## 2020-12-17 DIAGNOSIS — Z01419 Encounter for gynecological examination (general) (routine) without abnormal findings: Secondary | ICD-10-CM | POA: Diagnosis not present

## 2020-12-17 DIAGNOSIS — K439 Ventral hernia without obstruction or gangrene: Secondary | ICD-10-CM | POA: Diagnosis not present

## 2020-12-17 NOTE — Progress Notes (Signed)
Alison Best [facility] 1956-01-22 [id number]   History:    65 y.o. G2P1A1L1 Married. Son 76yo. S/P Lt SO. Retired this year.  [id number] presenting for annual gyn exam  HPI: Postmenopause. No HRT. No PMB. No pelvic pain. Had a bowel perforation secondary to Diverticulitis with Bowel resection, had colostomy. Following many surgeries, developed a large Left Abdominal Hernia. Needs to loose weight before repair. Obesity with BMI at44.36, stable x last year. Osteoarthritis.Cigarette smoker <1/2 pack per day. Many medical issues including DM and Hyperthyroidism.   Past medical history,surgical history, family history and social history were all reviewed and documented in the EPIC chart.  Gynecologic History Patient's last menstrual period was 03/22/2012.  Obstetric History OB History  Gravida Para Term Preterm AB Living  2 1     1 1   SAB IAB Ectopic Multiple Live Births      1        # Outcome Date GA Lbr Len/2nd Weight Sex Delivery Anes PTL Lv  2 Ectopic           1 Para              ROS: A ROS was performed and pertinent positives and negatives are included in the history.  GENERAL: No fevers or chills. HEENT: No change in vision, no earache, sore throat or sinus congestion. NECK: No pain or stiffness. CARDIOVASCULAR: No chest pain or pressure. No palpitations. PULMONARY: No shortness of breath, cough or wheeze. GASTROINTESTINAL: No abdominal pain, nausea, vomiting or diarrhea, melena or bright red blood per rectum. GENITOURINARY: No urinary frequency, urgency, hesitancy or dysuria. MUSCULOSKELETAL: No joint or muscle pain, no back pain, no recent trauma. DERMATOLOGIC: No rash, no itching, no lesions. ENDOCRINE: No polyuria, polydipsia, no heat or cold intolerance. No recent change in weight. HEMATOLOGICAL: No anemia or easy bruising or bleeding. NEUROLOGIC: No headache, seizures, numbness, tingling or weakness. PSYCHIATRIC: No depression, no loss of  interest in normal activity or change in sleep pattern.     Exam:   BP (!) 150/84 (BP Location: Left Arm)   Pulse 84   Ht 4' 11.45" (1.51 m)   Wt 223 lb (101.2 kg)   LMP 03/22/2012   BMI 44.36 kg/m   Body mass index is 44.36 kg/m.  General appearance : Well developed well nourished female. No acute distress HEENT: Eyes: no retinal hemorrhage or exudates,  Neck supple, trachea midline, no carotid bruits, no thyroidmegaly Lungs: Clear to auscultation, no rhonchi or wheezes, or rib retractions  Heart: Regular rate and rhythm, no murmurs or gallops Breast:Examined in sitting and supine position were symmetrical in appearance, no palpable masses or tenderness,  no skin retraction, no nipple inversion, no nipple discharge, no skin discoloration, no axillary or supraclavicular lymphadenopathy Abdomen: no palpable masses or tenderness, no rebound or guarding.  Large left ventral hernia. Extremities: no edema or skin discoloration or tenderness  Pelvic: Vulva: Normal             Vagina: No gross lesions or discharge  Cervix: No gross lesions or discharge.  Pap reflex done.  Uterus  RV, normal size, shape and consistency, non-tender and mobile  Adnexa  Without masses or tenderness  Anus: Normal   Assessment/Plan:  65 y.o. female for annual exam   1. Encounter for routine gynecological examination with Papanicolaou smear of cervix Normal gynecologic exam in menopause.  Pap reflex done on cervix.  Breasts normal.  Will schedule screening mammo now.  Health labs with [facility]  MD.  2. Postmenopausal Well on no HRT.  No PMB.  BD normal 09/2020.  3. Ventral hernia without obstruction or gangrene Will go back to General surgeon to plan repair.  4. Cigar smoker Will progressively decrease the number of cigarettes smoked everyday.  5. Class 3 severe obesity due to excess calories with serious comorbidity and body mass index (BMI) of 40.0 to 44.9 in adult [facility]) Lower calorie/carb diet.   Increase fitness.  Other orders - metFORMIN (GLUCOPHAGE) 500 MG tablet; Take 500 mg by mouth 2 (two) times daily. - methimazole (TAPAZOLE) 5 MG tablet; Take 2.5 mg by mouth daily.  Princess Bruins MD, 4:03 PM 12/17/2020

## 2020-12-22 LAB — PAP IG W/ RFLX HPV ASCU

## 2020-12-26 ENCOUNTER — Encounter: Payer: Self-pay | Admitting: *Deleted

## 2020-12-30 ENCOUNTER — Other Ambulatory Visit: Payer: Self-pay | Admitting: Family Medicine

## 2020-12-30 DIAGNOSIS — Z1231 Encounter for screening mammogram for malignant neoplasm of breast: Secondary | ICD-10-CM

## 2021-01-06 ENCOUNTER — Telehealth: Payer: BC Managed Care – PPO | Admitting: Physician Assistant

## 2021-01-06 DIAGNOSIS — B9689 Other specified bacterial agents as the cause of diseases classified elsewhere: Secondary | ICD-10-CM

## 2021-01-06 DIAGNOSIS — J019 Acute sinusitis, unspecified: Secondary | ICD-10-CM | POA: Diagnosis not present

## 2021-01-06 MED ORDER — DOXYCYCLINE HYCLATE 100 MG PO CAPS: 100.0000 mg | ORAL_CAPSULE | Freq: Two times a day (BID) | ORAL | 0 refills | Status: DC

## 2021-01-06 NOTE — Progress Notes (Signed)

## 2021-01-06 NOTE — Progress Notes (Signed)
I have spent 5 minutes in review of e-visit questionnaire, review and updating patient chart, medical decision making and response to patient.   Jackquline Branca Cody Latash Nouri, PA-C    

## 2021-01-07 ENCOUNTER — Ambulatory Visit (INDEPENDENT_AMBULATORY_CARE_PROVIDER_SITE_OTHER): Payer: BC Managed Care – PPO | Admitting: Professional

## 2021-01-07 DIAGNOSIS — F4321 Adjustment disorder with depressed mood: Secondary | ICD-10-CM

## 2021-01-15 ENCOUNTER — Encounter: Payer: Self-pay | Admitting: Family Medicine

## 2021-01-15 ENCOUNTER — Ambulatory Visit: Payer: BC Managed Care – PPO | Admitting: Family Medicine

## 2021-01-15 ENCOUNTER — Other Ambulatory Visit: Payer: Self-pay

## 2021-01-15 VITALS — BP 120/80 | HR 84 | Temp 98.3°F | Wt 223.0 lb

## 2021-01-15 DIAGNOSIS — K439 Ventral hernia without obstruction or gangrene: Secondary | ICD-10-CM | POA: Diagnosis not present

## 2021-01-15 DIAGNOSIS — L409 Psoriasis, unspecified: Secondary | ICD-10-CM

## 2021-01-15 DIAGNOSIS — M25562 Pain in left knee: Secondary | ICD-10-CM

## 2021-01-15 DIAGNOSIS — G8929 Other chronic pain: Secondary | ICD-10-CM

## 2021-01-15 DIAGNOSIS — Z6841 Body Mass Index (BMI) 40.0 and over, adult: Secondary | ICD-10-CM

## 2021-01-15 MED ORDER — BETAMETHASONE DIPROPIONATE 0.05 % EX CREA: TOPICAL_CREAM | Freq: Two times a day (BID) | CUTANEOUS | 2 refills | Status: DC

## 2021-01-15 NOTE — Progress Notes (Signed)
   Subjective:    Patient ID: Alison Best, female    DOB: 06-18-1956, 65 y.o.   MRN: [id number]  HPI Here asking about multiple issues. First she knows she is overweight and in poor condition, and she has been going to chair yoga and to water therapy. She has heard about a new lifestyle management clinic run by [facility] at the new [employer] facility called [facility], which incorporates exercise programs monitored by physical therapists and dietary programs monitored by nutritionists and physicians. She has been seeing [facility] for a torn ACL in the left knee. Surgery has been ruled out but she knows PT and losing weight would help this. She also has several ventral hernias that are the result of colon resection surgery and then a colostomy takedown surgery. These cause he mild pain and she knows losing weight is the best thing for this as well. Finally she has had a patch of itchy skin on the right ear lobe for a year.    Review of Systems  Constitutional: Negative.   Respiratory: Positive for shortness of breath. Negative for cough and wheezing.   Cardiovascular: Negative.   Gastrointestinal: Positive for abdominal pain. Negative for blood in stool, constipation, diarrhea, nausea and vomiting.  Musculoskeletal: Positive for arthralgias.  Skin: Positive for rash.       Objective:   Physical Exam Constitutional:      Appearance: She is obese.     Comments: She walks with a cane   Cardiovascular:     Rate and Rhythm: Normal rate and regular rhythm.     Pulses: Normal pulses.     Heart sounds: Normal heart sounds.  Pulmonary:     Effort: Pulmonary effort is normal.     Breath sounds: Normal breath sounds.  Abdominal:     General: Bowel sounds are normal. There is no distension.     Palpations: There is no mass.     Tenderness: There is no abdominal tenderness. There is no guarding or rebound.     Hernia: A hernia is present.  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 I agree that losing weight is the best way to treat both her weak knee and the hernias. We will refer her to the [facility] program as above.  Alison Penna, MD

## 2021-01-27 ENCOUNTER — Ambulatory Visit (INDEPENDENT_AMBULATORY_CARE_PROVIDER_SITE_OTHER): Payer: BC Managed Care – PPO | Admitting: Family Medicine

## 2021-01-27 ENCOUNTER — Other Ambulatory Visit: Payer: Self-pay

## 2021-01-27 ENCOUNTER — Encounter: Payer: Self-pay | Admitting: Family Medicine

## 2021-01-27 VITALS — BP 118/80 | HR 70 | Temp 98.0°F | Ht 59.0 in | Wt 221.4 lb

## 2021-01-27 DIAGNOSIS — M1990 Unspecified osteoarthritis, unspecified site: Secondary | ICD-10-CM

## 2021-01-27 DIAGNOSIS — Z0000 Encounter for general adult medical examination without abnormal findings: Secondary | ICD-10-CM | POA: Diagnosis not present

## 2021-01-27 LAB — CBC WITH DIFFERENTIAL/PLATELET
Basophils Absolute: 0.1 10*3/uL (ref 0.0–0.1)
Basophils Relative: 0.9 % (ref 0.0–3.0)
Eosinophils Absolute: 0.1 10*3/uL (ref 0.0–0.7)
Eosinophils Relative: 1.2 % (ref 0.0–5.0)
HCT: 43.9 % (ref 36.0–46.0)
Hemoglobin: 14.9 g/dL (ref 12.0–15.0)
Lymphocytes Relative: 31.2 % (ref 12.0–46.0)
Lymphs Abs: 2.2 10*3/uL (ref 0.7–4.0)
MCHC: 34 g/dL (ref 30.0–36.0)
MCV: 92.4 fl (ref 78.0–100.0)
Monocytes Absolute: 0.5 10*3/uL (ref 0.1–1.0)
Monocytes Relative: 7.2 % (ref 3.0–12.0)
Neutro Abs: 4.3 10*3/uL (ref 1.4–7.7)
Neutrophils Relative %: 59.5 % (ref 43.0–77.0)
Platelets: 274 10*3/uL (ref 150.0–400.0)
RBC: 4.75 Mil/uL (ref 3.87–5.11)
RDW: 15 % (ref 11.5–15.5)
WBC: 7.2 10*3/uL (ref 4.0–10.5)

## 2021-01-27 LAB — HEPATIC FUNCTION PANEL
ALT: 25 U/L (ref 0–35)
AST: 18 U/L (ref 0–37)
Albumin: 4.2 g/dL (ref 3.5–5.2)
Alkaline Phosphatase: 76 U/L (ref 39–117)
Bilirubin, Direct: 0.1 mg/dL (ref 0.0–0.3)
Total Bilirubin: 0.4 mg/dL (ref 0.2–1.2)
Total Protein: 6.8 g/dL (ref 6.0–8.3)

## 2021-01-27 LAB — HEMOGLOBIN A1C: Hgb A1c MFr Bld: 6.1 % (ref 4.6–6.5)

## 2021-01-27 LAB — BASIC METABOLIC PANEL
BUN: 11 mg/dL (ref 6–23)
CO2: 28 mEq/L (ref 19–32)
Calcium: 9.5 mg/dL (ref 8.4–10.5)
Chloride: 103 mEq/L (ref 96–112)
Creatinine, Ser: 0.71 mg/dL (ref 0.40–1.20)
GFR: 90.47 mL/min (ref >60.00)
Glucose, Bld: 105 mg/dL — ABNORMAL HIGH (ref 70–99)
Potassium: 4.2 mEq/L (ref 3.5–5.1)
Sodium: 140 mEq/L (ref 135–145)

## 2021-01-27 LAB — TSH: TSH: 2.08 u[IU]/mL (ref 0.35–4.50)

## 2021-01-27 LAB — LIPID PANEL
Cholesterol: 302 mg/dL — ABNORMAL HIGH (ref 0–200)
HDL: 24 mg/dL — ABNORMAL LOW (ref >39.00)
Total CHOL/HDL Ratio: 13
Triglycerides: 666 mg/dL — ABNORMAL HIGH (ref 0.0–149.0)

## 2021-01-27 LAB — T4, FREE: Free T4: 0.72 ng/dL (ref 0.60–1.60)

## 2021-01-27 LAB — T3, FREE: T3, Free: 2.8 pg/mL (ref 2.3–4.2)

## 2021-01-27 LAB — LDL CHOLESTEROL, DIRECT: Direct LDL: 48 mg/dL

## 2021-01-27 MED ORDER — AZITHROMYCIN 250 MG PO TABS: ORAL_TABLET | ORAL | 0 refills | Status: DC

## 2021-01-27 MED ORDER — DICLOFENAC SODIUM 75 MG PO TBEC: 75.0000 mg | DELAYED_RELEASE_TABLET | Freq: Two times a day (BID) | ORAL | 3 refills | Status: DC

## 2021-01-27 NOTE — Addendum Note (Signed)
Addended by: Elmer Picker on: 01/27/2021 09:59 AM   Modules accepted: Orders

## 2021-01-27 NOTE — Progress Notes (Signed)
Subjective:    Patient ID: Alison Best, female    DOB: 05/10/1956, 65 y.o.   MRN: [id number]  HPI Here for a well exam. We have referred her to the [facility] PT program at the [facility] site, and she will have her initial assessment tomorrow. She is very excited about this program. She sees Dr. Cruzita Lederer for the diabetes. She gets yearly eye exams. Her mammogram is coming up soon. She thinks she has a sinus infection. She began to have sinus pressure, ear pressure, PND, and blowing green mucus from the nose about 3 weeks ago. 2 weeks ago she had a virtual visit with someone, and they gave her a course of Doxycycline. This seemed to help, but now the symptoms are back. No fever or cough. She mentions alternating cycles of constipation for 2-3 days and then diarrhea for a day or two.  Results for orders placed or performed in visit on 12/17/20  Pap IG w/ reflex to HPV when ASC-U  Result Value Ref Range   Clinical Information:     LMP:     PREV. PAP:     PREV. BX:     HPV DNA Probe-Source     STATEMENT OF ADEQUACY:     INTERPRETATION/RESULT:     Comment:     CYTOTECHNOLOGIST:        Review of Systems  Constitutional: Negative.   HENT: Positive for congestion, postnasal drip and sinus pressure.   Eyes: Negative.   Respiratory: Negative.   Cardiovascular: Negative.   Gastrointestinal: Positive for constipation and diarrhea. Negative for abdominal distention, abdominal pain, anal bleeding, blood in stool, nausea, rectal pain and vomiting.  Genitourinary: Negative for decreased urine volume, difficulty urinating, dyspareunia, dysuria, enuresis, flank pain, frequency, hematuria, pelvic pain and urgency.  Musculoskeletal: Negative.   Skin: Negative.   Neurological: Negative.   Psychiatric/Behavioral: Negative.        Objective:   Physical Exam Constitutional:      General: She is not in acute distress.    Appearance: She is well-developed. She is obese.  HENT:      Head: Normocephalic and atraumatic.     Right Ear: External ear normal.     Left Ear: External ear normal.     Nose: Nose normal.     Mouth/Throat:     Pharynx: No oropharyngeal exudate.  Eyes:     General: No scleral icterus.    Conjunctiva/sclera: Conjunctivae normal.     Pupils: Pupils are equal, round, and reactive to light.  Neck:     Thyroid: No thyromegaly.     Vascular: No JVD.  Cardiovascular:     Rate and Rhythm: Normal rate and regular rhythm.     Heart sounds: Normal heart sounds. No murmur heard. No friction rub. No gallop.   Pulmonary:     Effort: Pulmonary effort is normal. No respiratory distress.     Breath sounds: Normal breath sounds. No wheezing or rales.  Chest:     Chest wall: No tenderness.  Abdominal:     General: Bowel sounds are normal. There is no distension.     Palpations: Abdomen is soft. There is no mass.     Tenderness: There is no abdominal tenderness. There is no guarding or rebound.  Musculoskeletal:        General: No tenderness. Normal range of motion.     Cervical back: Normal range of motion and neck supple.  Lymphadenopathy:     Cervical: No cervical  adenopathy.  Skin:    General: Skin is warm and dry.     Findings: No erythema or rash.  Neurological:     Mental Status: She is alert and oriented to person, place, and time.     Cranial Nerves: No cranial nerve deficit.     Motor: No abnormal muscle tone.     Coordination: Coordination normal.     Deep Tendon Reflexes: Reflexes are normal and symmetric. Reflexes normal.  Psychiatric:        Behavior: Behavior normal.        Thought Content: Thought content normal.        Judgment: Judgment normal.           Assessment & Plan:  Well exam. We discussed diet and exercise. Get fasting labs. Treat the sinusitis with a Zpack. Treat the constipation with Miralax daily.  Alysia Penna, MD

## 2021-01-28 ENCOUNTER — Encounter (HOSPITAL_BASED_OUTPATIENT_CLINIC_OR_DEPARTMENT_OTHER): Payer: Self-pay | Admitting: Physical Therapy

## 2021-01-28 ENCOUNTER — Ambulatory Visit (HOSPITAL_BASED_OUTPATIENT_CLINIC_OR_DEPARTMENT_OTHER): Payer: BC Managed Care – PPO | Attending: Family Medicine | Admitting: Physical Therapy

## 2021-01-28 ENCOUNTER — Other Ambulatory Visit: Payer: Self-pay

## 2021-01-28 DIAGNOSIS — G8929 Other chronic pain: Secondary | ICD-10-CM | POA: Diagnosis not present

## 2021-01-28 DIAGNOSIS — R262 Difficulty in walking, not elsewhere classified: Secondary | ICD-10-CM | POA: Diagnosis not present

## 2021-01-28 DIAGNOSIS — E782 Mixed hyperlipidemia: Secondary | ICD-10-CM

## 2021-01-28 DIAGNOSIS — M6281 Muscle weakness (generalized): Secondary | ICD-10-CM

## 2021-01-28 DIAGNOSIS — M25562 Pain in left knee: Secondary | ICD-10-CM | POA: Insufficient documentation

## 2021-01-28 MED ORDER — FENOFIBRATE 145 MG PO TABS: 145.0000 mg | ORAL_TABLET | Freq: Every day | ORAL | 3 refills | Status: DC

## 2021-01-28 NOTE — Therapy (Signed)
[place], [place], [address] Phone: [phone]   Fax:  [phone]  Physical Therapy Evaluation  Patient Details  Name: Alison Best [facility] MRN: [id number] Date of Birth: 1956-05-25 Referring Provider (PT): Laurey Morale, MD   Encounter Date: 01/28/2021   PT End of Session - 01/28/21 1340    Visit Number 1    Number of Visits 17    Date for PT Re-Evaluation 03/27/21    Authorization Type BCBS 30 VL    PT Start Time 1145    PT Stop Time 1230    PT Time Calculation (min) 45 min    Activity Tolerance Patient tolerated treatment well    Behavior During Therapy [facility] for tasks assessed/performed           Past Medical History:  Diagnosis Date  . Anal fissure   . Arthritis   . Bursitis   . Constipation   . COPD (chronic obstructive pulmonary disease) ([place])   . Diabetes mellitus without complication ([place])    sees Dr. Cruzita Lederer   . Diverticulitis of colon 02/2015  . Ectopic pregnancy   . Emphysema of lung ([place])   . Frozen shoulder   . Heart murmur   . Hernia, abdominal   . Hyperlipidemia   . Hyperthyroidism   . Infertility, female   . Irregular heartbeat   . Joint pain   . Obesity   . Osteoarthritis   . Sleep apnea   . Swallowing difficulty   . Thyroid disease    hyperthyroid    Past Surgical History:  Procedure Laterality Date  . APPENDECTOMY    . CESAREAN SECTION    . COLONOSCOPY  09-10-10   per Dr. Acquanetta Sit, diverticulosis of descending colon and sigmoid, internal hemorrhoids, repeat in 10 yrs  . COLOSTOMY N/A 02/20/2015   Procedure: COLOSTOMY;  Surgeon: Jackolyn Confer, MD;  Location: [facility];  Service: General;  Laterality: N/A;  . COLOSTOMY REVISION  02/20/2015   Procedure: Sigmoid Colectomy;  Surgeon: Jackolyn Confer, MD;  Location: [facility];  Service: General;;  . CYSTOSCOPY N/A 02/20/2015   Procedure: Consuela Mimes;  Surgeon: Kathie Rhodes, MD;  Location: [facility];  Service:  Urology;  Laterality: N/A;  . DILATION AND CURETTAGE OF UTERUS     x3  . ILEO LOOP COLOSTOMY CLOSURE N/A 09/26/2015   Procedure: LAPAROSCOPIC LYSIS OF ADHESIONS, COLOSTOMY CLOSURE;  Surgeon: Jackolyn Confer, MD;  Location: [facility];  Service: General;  Laterality: N/A;  . LAPAROTOMY N/A 02/20/2015   Procedure: Emergency EXPLORATORY and drainiage of interabdominal abcesses;  Surgeon: Jackolyn Confer, MD;  Location: [facility];  Service: General;  Laterality: N/A;  . SALPINGOOPHORECTOMY Left 02/20/2015   Procedure: SALPINGO OOPHORECTOMY;  Surgeon: Jackolyn Confer, MD;  Location: [facility];  Service: General;  Laterality: Left;  . TONSILLECTOMY  1980    There were no vitals filed for this visit.    Subjective Assessment - 01/28/21 1151    Subjective In 2016 I had an attack of diverticulitis when [place] was [place]. Went to ER when we got home and found a rupture. [facility] surgery and colostomy placed. 5 mo later had reversal. Was in hospital for about 6 weeks followed by rehab and [facility]. I lost a lot of weight at that time and was able to recover. I have done nothing but gain weight since then. I did not do therapy since then and developed hernias. the weight around my middle is shifted to the Lt side  of my body due to incision from surgery. I was traveling on a plane for work in 2018 and was pivoting, pushed on my leg resulting in torn ACL and meniscus. MD did not recommend repair and did a lot of PT. Had good success but not quite 100%, was able to go on trip to [place]. Retired in December, I am ready to focus on myself.    How long can you walk comfortably? from car lot to PT clinic was okay    Patient Stated Goals Sept 1st going on a trip to [place].get moving again & feeling better. improve balance, stairs    Currently in Pain? No/denies              [facility] PT Assessment - 01/28/21 0001      Assessment   Medical Diagnosis Lt knee pain    Referring Provider (PT) Laurey Morale, MD    Hand  Dominance Right    Next MD Visit 37mo      Precautions   Precautions None      Restrictions   Weight Bearing Restrictions No      Balance Screen   Has the patient fallen in the past 6 months No      [place] residence    Additional Comments 5 steps to enter home, no hand rails      Prior Function   Level of Independence Independent    Vocation Retired      Associate Professor   Overall Cognitive Status Within Functional Limits for tasks assessed      Sensation   Additional Comments WFL      Special Tests   Other special tests 5TSTS 23      Ambulation/Gait   Gait Comments rollator                      Objective measurements completed on examination: See above findings.               PT Education - 01/28/21 1340    Education Details antomy of condition, POC, integrated specialties    Person(s) Educated Patient    Methods Explanation    Comprehension Verbalized understanding            PT Short Term Goals - 01/28/21 1336      PT SHORT TERM GOAL #1   Title pt will be independent with HEP as it has been established in the short term.    Baseline will progress as appropriate    Time 3    Period Weeks    Status New    Target Date 02/20/21      PT SHORT TERM GOAL #2   Title pt will verbalize feeling some improvement in overall mobility through ADLs.    Baseline overall feeling limited and trying to begin overall health at this point    Time 3    Period Weeks    Status New    Target Date 02/20/21             PT Long Term Goals - 01/28/21 1331      PT LONG TERM GOAL #1   Title pt will be able to navigate stairs at her home with confidence and necessary strength for reciprocal pattern    Baseline 5 steps without hand rails    Time 8    Period Weeks    Status New    Target Date 03/27/21  PT LONG TERM GOAL #2   Title 5TSTS to improve by MDC of 4s    Baseline 23s at eval, community dwelling norm  for age group is 11.4    Time 8    Period Weeks    Status New    Target Date 03/27/21                  Plan - 01/28/21 1243    Clinical Impression Statement Pt presents to PT with diagnosis of Lt knee pain as well as obestiy. She has a very extensive surgical history that is contributing to her pain as her panus is pressing into Lt hip and altering gait patterns. She recently began usign rollator which she says helps her to walk better. She recently retired from a very busy position and is prepared to focus on her care. she was being seen at [facility] but has not been since Oct. She has been placing McConnell tape on her knee and I advised that she can continue as long as it feels good. We did spend the entirety of her appointment discussing history and plan and will perform more objective measures at her next appointment. Pt will benefit from skilled PT to address biomechanical chain deficits and meet long term functional goals. She is taking yoga classes at [facility] and we will adjust POC frequency as needed to support progress. Will begin aquatic rehab when she is ready- she has ordered support leggings for the pool. At this time, [facility] is in the hiring process for nutrition so we will get her set up with them when the program starts.    Personal Factors and Comorbidities Time since onset of injury/illness/exacerbation;Fitness;Comorbidity 3+    Comorbidities obesity, OA, COPD, DM, emphysema    Examination-Activity Limitations Squat;Stairs;Stand;Carry;Locomotion Level;Sit    Examination-Participation Restrictions Occupation;Community Activity;Shop    Stability/Clinical Decision Making Evolving/Moderate complexity    Clinical Decision Making Moderate    Rehab Potential Good    PT Frequency 2x / week    PT Duration 8 weeks    PT Treatment/Interventions ADLs/Self Care Home Management;Aquatic Therapy;Cryotherapy;Electrical Stimulation;Moist Heat;Balance  training;Therapeutic exercise;Therapeutic activities;Functional mobility training;Stair training;Gait training;Neuromuscular re-education;Patient/family education;Manual techniques;Taping;Dry needling;Passive range of motion    PT Next Visit Plan BERG, 6MWT & update goals    Recommended Other Services plastic surgery interviews, nutrition    Consulted and Agree with Plan of Care Patient           Patient will benefit from skilled therapeutic intervention in order to improve the following deficits and impairments:  Decreased strength,Postural dysfunction,Difficulty walking,Decreased activity tolerance,Decreased mobility,Decreased balance,Pain,Decreased endurance  Visit Diagnosis: Chronic pain of left knee - Plan: PT plan of care cert/re-cert  Muscle weakness (generalized) - Plan: PT plan of care cert/re-cert  Difficulty in walking, not elsewhere classified - Plan: PT plan of care cert/re-cert     Problem List Patient Active Problem List   Diagnosis Date Noted  . Psoriasis 01/15/2021  . Chronic pain of left knee 01/15/2021  . Ventral hernia without obstruction or gangrene 01/15/2021  . Class 3 severe obesity with serious comorbidity and body mass index (BMI) of 40.0 to 44.9 in adult [facility]) 01/15/2019  . Acne rosacea 05/10/2016  . Multinodular goiter 02/27/2016  . 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 Prediabetes 07/30/2015  . Perforation of sigmoid colon ([place]) 02/20/2015  . Diverticulitis of large intestine with abscess without bleeding 02/18/2015  . OSA (obstructive  sleep apnea) 05/09/2013  . GASTROENTERITIS 08/18/2010  . RAYNAUD'S SYNDROME 02/17/2009  . PLANTAR FASCIITIS 02/17/2009  . Asthma 06/12/2008  . Hyperlipidemia 09/04/2007  . DEVIATED SEPTUM 09/04/2007  . ALLERGIC RHINITIS 09/04/2007  . GERD 09/04/2007  . OSTEOARTHRITIS 09/04/2007  . HEADACHE 09/04/2007   Sameerah Nachtigal C. Pinkey Mcjunkin PT,  DPT 01/28/21 1:43 PM   [facility] [address] [place], [place], [address] Phone: [phone]   Fax:  [phone]  Name: Amarylis Rovito [facility] MRN: [id number] Date of Birth: 12-03-1955

## 2021-02-02 ENCOUNTER — Ambulatory Visit: Payer: BC Managed Care – PPO | Admitting: Professional

## 2021-02-03 ENCOUNTER — Ambulatory Visit (HOSPITAL_BASED_OUTPATIENT_CLINIC_OR_DEPARTMENT_OTHER): Payer: BC Managed Care – PPO | Admitting: Physical Therapy

## 2021-02-03 ENCOUNTER — Other Ambulatory Visit: Payer: Self-pay

## 2021-02-03 ENCOUNTER — Encounter: Payer: Self-pay | Admitting: Family Medicine

## 2021-02-03 ENCOUNTER — Encounter (HOSPITAL_BASED_OUTPATIENT_CLINIC_OR_DEPARTMENT_OTHER): Payer: Self-pay | Admitting: Physical Therapy

## 2021-02-03 DIAGNOSIS — G8929 Other chronic pain: Secondary | ICD-10-CM

## 2021-02-03 DIAGNOSIS — R262 Difficulty in walking, not elsewhere classified: Secondary | ICD-10-CM | POA: Diagnosis not present

## 2021-02-03 DIAGNOSIS — M6281 Muscle weakness (generalized): Secondary | ICD-10-CM | POA: Diagnosis not present

## 2021-02-03 DIAGNOSIS — M25562 Pain in left knee: Secondary | ICD-10-CM | POA: Diagnosis not present

## 2021-02-03 NOTE — Therapy (Signed)
[facility] [address] [place], [place], [address] Phone: [phone]   Fax:  [phone]  Physical Therapy Treatment  Patient Details  Name: Alison Best [facility] MRN: [id number] Date of Birth: [birth year] Referring Provider (PT): Laurey Morale, MD   Encounter Date: 02/03/2021   PT End of Session - 02/03/21 1146    Visit Number 2    Number of Visits 17    Date for PT Re-Evaluation 03/27/21    Authorization Type BCBS 30 VL    PT Start Time 4403    PT Stop Time 1232    PT Time Calculation (min) 47 min    Activity Tolerance Patient tolerated treatment well    Behavior During Therapy [facility] for tasks assessed/performed           Past Medical History:  Diagnosis Date  . Anal fissure   . Arthritis   . Bursitis   . Constipation   . COPD (chronic obstructive pulmonary disease) ([place])   . Diabetes mellitus without complication ([place])    sees Dr. Cruzita Lederer   . Diverticulitis of colon 02/2015  . Ectopic pregnancy   . Emphysema of lung ([place])   . Frozen shoulder   . Heart murmur   . Hernia, abdominal   . Hyperlipidemia   . Hyperthyroidism   . Infertility, female   . Irregular heartbeat   . Joint pain   . Obesity   . Osteoarthritis   . Sleep apnea   . Swallowing difficulty   . Thyroid disease    hyperthyroid    Past Surgical History:  Procedure Laterality Date  . APPENDECTOMY    . CESAREAN SECTION    . COLONOSCOPY  09-10-10   per Dr. Acquanetta Sit, diverticulosis of descending colon and sigmoid, internal hemorrhoids, repeat in 10 yrs  . COLOSTOMY N/A 02/20/2015   Procedure: COLOSTOMY;  Surgeon: Jackolyn Confer, MD;  [facility]: [facility];  Service: General;  Laterality: N/A;  . COLOSTOMY REVISION  02/20/2015   Procedure: Sigmoid Colectomy;  Surgeon: Jackolyn Confer, MD;  [facility];  Service: General;;  . CYSTOSCOPY N/A 02/20/2015   Procedure: Consuela Mimes;  Surgeon: Kathie Rhodes, MD;  [facility];  Service:  Urology;  Laterality: N/A;  . DILATION AND CURETTAGE OF UTERUS     x3  . ILEO LOOP COLOSTOMY CLOSURE N/A 09/26/2015   Procedure: LAPAROSCOPIC LYSIS OF ADHESIONS, COLOSTOMY CLOSURE;  Surgeon: Jackolyn Confer, MD;  [facility];  Service: General;  Laterality: N/A;  . LAPAROTOMY N/A 02/20/2015   Procedure: Emergency EXPLORATORY and drainiage of interabdominal abcesses;  Surgeon: Jackolyn Confer, MD;  [facility];  Service: General;  Laterality: N/A;  . SALPINGOOPHORECTOMY Left 02/20/2015   Procedure: SALPINGO OOPHORECTOMY;  Surgeon: Jackolyn Confer, MD;  [facility];  Service: General;  Laterality: Left;  . TONSILLECTOMY  1980    There were no vitals filed for this visit.   Subjective Assessment - 02/03/21 1146    Subjective Did chair yoga yesterday and felt it last night. Feeling pretty good today.    Currently in Pain? No/denies              [facility] PT Assessment - 02/03/21 0001      6 minute walk test results    Endurance additional comments 615      Standardized Balance Assessment   Standardized Balance Assessment Berg Balance Test      Berg Balance Test   Sit to Stand Able to stand  independently  using hands    Standing Unsupported Able to stand safely 2 minutes    Sitting with Back Unsupported but Feet Supported on Floor or Stool Able to sit safely and securely 2 minutes    Stand to Sit Sits safely with minimal use of hands    Transfers Able to transfer safely, minor use of hands    Standing Unsupported with Eyes Closed Able to stand 10 seconds safely    Standing Unsupported with Feet Together Able to place feet together independently and stand 1 minute safely    From Standing, Reach Forward with Outstretched Arm Can reach forward >12 cm safely (5")    From Standing Position, Pick up Object from Floor Able to pick up shoe, needs supervision    From Standing Position, Turn to Look Behind Over each Shoulder Looks behind from both sides and weight shifts well     Turn 360 Degrees Able to turn 360 degrees safely but slowly    Standing Unsupported, Alternately Place Feet on Step/Stool Able to complete >2 steps/needs minimal assist   uses Lt arm to lift Lt leg   Standing Unsupported, One Foot in Front Able to take small step independently and hold 30 seconds    Standing on One Leg Unable to try or needs assist to prevent fall    Total Score 42                         [facility] Adult PT Treatment/Exercise - 02/03/21 0001      Exercises   Exercises Lumbar;Knee/Hip      Lumbar Exercises: Standing   Other Standing Lumbar Exercises 6MWT & BERG testing      Lumbar Exercises: Seated   LAQ on Chair Limitations paired with ab set    Other Seated Lumbar Exercises seated ab set    Other Seated Lumbar Exercises seated clam without resistance      Lumbar Exercises: Supine   AB Set Limitations physioball press into thighs    Bridge with Ball Squeeze Limitations progressed from ab set with ball squeeze to add bridge      Lumbar Exercises: Sidelying   Clam 20 reps;Both      Knee/Hip Exercises: Stretches   Other Knee/Hip Stretches bent knee fallouts                  PT Education - 02/03/21 1541    Education Details imaging, surgeries, outcomes of tests/measures, rollator with larger wheels    Person(s) Educated Patient    Methods Explanation    Comprehension Verbalized understanding;Need further instruction            PT Short Term Goals - 01/28/21 1336      PT SHORT TERM GOAL #1   Title pt will be independent with HEP as it has been established in the short term.    Baseline will progress as appropriate    Time 3    Period Weeks    Status New    Target Date 02/20/21      PT SHORT TERM GOAL #2   Title pt will verbalize feeling some improvement in overall mobility through ADLs.    Baseline overall feeling limited and trying to begin overall health at this point    Time 3    Period Weeks    Status New    Target Date  02/20/21             PT Long Term  Goals - 02/03/21 1542      PT LONG TERM GOAL #1   Title pt will be able to navigate stairs at her home with confidence and necessary strength for reciprocal pattern    Baseline 5 steps without hand rails    Time 8    Period Weeks    Status New    Target Date 03/27/21      PT LONG TERM GOAL #2   Title 5TSTS to improve by MDC of 4s    Baseline 23s at eval, community dwelling norm for age group is 11.4    Time 8    Period Weeks    Status New    Target Date 03/27/21      PT LONG TERM GOAL #3   Title BERG to improve by MDC of 3 points    Baseline see flowsheet    Time 8    Period Weeks    Status New    Target Date 03/27/21      PT LONG TERM GOAL #4   Title 6MWT to improve by MDC of 191 ft    Baseline age appropriate would be 1865    Time 8    Period Weeks    Status New    Target Date 03/27/21                 Plan - 02/03/21 1220    Clinical Impression Statement Pt tolerated testing very well-noted she is walking much better than she could have 2 weeks ago before starting anti-inflammatory. Goals updated to reflect obj measures taken today. Groin pain noted suspicious of Femoral-acetabular joint injury from uneven weight distribution and antalgic gait. I do think an Xray would be good to obtain images of hip but advised pt that I think it would be best to take care of hernias and panus weight distribution before she would feel relief from any surgical interventions to hip.    PT Treatment/Interventions ADLs/Self Care Home Management;Aquatic Therapy;Cryotherapy;Electrical Stimulation;Moist Heat;Balance training;Therapeutic exercise;Therapeutic activities;Functional mobility training;Stair training;Gait training;Neuromuscular re-education;Patient/family education;Manual techniques;Taping;Dry needling;Passive range of motion    PT Next Visit Plan cont with core activation & [place] hip, leg lengthener decompression exercise    PT Home  Exercise Plan [facility]    Consulted and Agree with Plan of Care Patient           Patient will benefit from skilled therapeutic intervention in order to improve the following deficits and impairments:  Decreased strength,Postural dysfunction,Difficulty walking,Decreased activity tolerance,Decreased mobility,Decreased balance,Pain,Decreased endurance  Visit Diagnosis: Chronic pain of left knee  Muscle weakness (generalized)  Difficulty in walking, not elsewhere classified     Problem List Patient Active Problem List   Diagnosis Date Noted  . Psoriasis 01/15/2021  . Chronic pain of left knee 01/15/2021  . Ventral hernia without obstruction or gangrene 01/15/2021  . Class 3 severe obesity with serious comorbidity and body mass index (BMI) of 40.0 to 44.9 in adult [facility]) 01/15/2019  . Acne rosacea 05/10/2016  . Multinodular goiter 02/27/2016  . 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 Prediabetes 07/30/2015  . Perforation of sigmoid colon ([place]) 02/20/2015  . Diverticulitis of large intestine with abscess without bleeding 02/18/2015  . OSA (obstructive sleep apnea) 05/09/2013  . GASTROENTERITIS 08/18/2010  . RAYNAUD'S SYNDROME 02/17/2009  . PLANTAR FASCIITIS 02/17/2009  . Asthma 06/12/2008  . Hyperlipidemia 09/04/2007  . DEVIATED SEPTUM 09/04/2007  . ALLERGIC RHINITIS 09/04/2007  . GERD  09/04/2007  . OSTEOARTHRITIS 09/04/2007  . HEADACHE 09/04/2007    Chauntel Windsor C. Jakaree Pickard PT, DPT 02/03/21 5:01 PM   [facility] [address] [place], [place], [address] Phone: [phone]   Fax:  [phone]  Name: Aleeza [facility] MRN: [id number] Date of Birth: [birth year]

## 2021-02-04 ENCOUNTER — Encounter (HOSPITAL_BASED_OUTPATIENT_CLINIC_OR_DEPARTMENT_OTHER): Payer: Self-pay | Admitting: Physical Therapy

## 2021-02-04 ENCOUNTER — Ambulatory Visit (HOSPITAL_BASED_OUTPATIENT_CLINIC_OR_DEPARTMENT_OTHER): Payer: BC Managed Care – PPO | Admitting: Physical Therapy

## 2021-02-04 DIAGNOSIS — M25562 Pain in left knee: Secondary | ICD-10-CM | POA: Diagnosis not present

## 2021-02-04 DIAGNOSIS — M6281 Muscle weakness (generalized): Secondary | ICD-10-CM

## 2021-02-04 DIAGNOSIS — G8929 Other chronic pain: Secondary | ICD-10-CM | POA: Diagnosis not present

## 2021-02-04 DIAGNOSIS — R262 Difficulty in walking, not elsewhere classified: Secondary | ICD-10-CM

## 2021-02-04 NOTE — Telephone Encounter (Signed)
Tell her to have her therapist send me an exact description of this walker so I can order the right one

## 2021-02-04 NOTE — Therapy (Signed)
[place], [place], [address] Phone: [phone]   Fax:  [phone]  Physical Therapy Treatment  Patient Details  Name: Alison Best [facility] MRN: [id number] Date of Birth: [birth year] Referring Provider (PT): Laurey Morale, MD   Encounter Date: 02/04/2021   PT End of Session - 02/04/21 0850    Visit Number 3    Number of Visits 17    Date for PT Re-Evaluation 03/27/21    Authorization Type BCBS 30 VL    PT Start Time 0845    PT Stop Time 0928    PT Time Calculation (min) 43 min    Activity Tolerance Patient tolerated treatment well    Behavior During Therapy [facility] for tasks assessed/performed           Past Medical History:  Diagnosis Date  . Anal fissure   . Arthritis   . Bursitis   . Constipation   . COPD (chronic obstructive pulmonary disease) ([place])   . Diabetes mellitus without complication ([place])    sees Dr. Cruzita Lederer   . Diverticulitis of colon 02/2015  . Ectopic pregnancy   . Emphysema of lung ([place])   . Frozen shoulder   . Heart murmur   . Hernia, abdominal   . Hyperlipidemia   . Hyperthyroidism   . Infertility, female   . Irregular heartbeat   . Joint pain   . Obesity   . Osteoarthritis   . Sleep apnea   . Swallowing difficulty   . Thyroid disease    hyperthyroid    Past Surgical History:  Procedure Laterality Date  . APPENDECTOMY    . CESAREAN SECTION    . COLONOSCOPY  09-10-10   per Dr. Acquanetta Sit, diverticulosis of descending colon and sigmoid, internal hemorrhoids, repeat in 10 yrs  . COLOSTOMY N/A 02/20/2015   Procedure: COLOSTOMY;  Surgeon: Jackolyn Confer, MD;  [facility];  Service: General;  Laterality: N/A;  . COLOSTOMY REVISION  02/20/2015   Procedure: Sigmoid Colectomy;  Surgeon: Jackolyn Confer, MD;  [facility];  Service: General;;  . CYSTOSCOPY N/A 02/20/2015   Procedure: Consuela Mimes;  Surgeon: Kathie Rhodes, MD;  [facility];  Service:  Urology;  Laterality: N/A;  . DILATION AND CURETTAGE OF UTERUS     x3  . ILEO LOOP COLOSTOMY CLOSURE N/A 09/26/2015   Procedure: LAPAROSCOPIC LYSIS OF ADHESIONS, COLOSTOMY CLOSURE;  Surgeon: Jackolyn Confer, MD;  [facility];  Service: General;  Laterality: N/A;  . LAPAROTOMY N/A 02/20/2015   Procedure: Emergency EXPLORATORY and drainiage of interabdominal abcesses;  Surgeon: Jackolyn Confer, MD;  [facility];  Service: General;  Laterality: N/A;  . SALPINGOOPHORECTOMY Left 02/20/2015   Procedure: SALPINGO OOPHORECTOMY;  Surgeon: Jackolyn Confer, MD;  [facility];  Service: General;  Laterality: Left;  . TONSILLECTOMY  1980    There were no vitals filed for this visit.   Subjective Assessment - 02/04/21 0849    Subjective Left hip his popping today.    Patient Stated Goals Sept 1st going on a trip to [place].get moving again & feeling better. improve balance, stairs    Currently in Pain? Yes    Pain Score 3     Pain Location Hip    Pain Orientation Left    Pain Descriptors / Indicators Other (Comment)   popping   Aggravating Factors  weight bearing              [facility] PT Assessment -  02/04/21 0001      Posture/Postural Control   Posture Comments functional LLD- Lt longer                         [facility] Adult PT Treatment/Exercise - 02/04/21 0001      Lumbar Exercises: Stretches   Other Lumbar Stretch Exercise leg lengthener supine    Other Lumbar Stretch Exercise LTR following METs      Lumbar Exercises: Aerobic   Nustep 6 min L3 UE & LE      Knee/Hip Exercises: Supine   Other Supine Knee/Hip Exercises hooklying- Lt heel press, Rt isometric hip flexion, adduction with ball all 5x5s      Manual Therapy   Manual Therapy Joint mobilization    Joint Mobilization LLE LAD & lateral distraction                    PT Short Term Goals - 01/28/21 1336      PT SHORT TERM GOAL #1   Title pt will be independent with HEP as it has  been established in the short term.    Baseline will progress as appropriate    Time 3    Period Weeks    Status New    Target Date 02/20/21      PT SHORT TERM GOAL #2   Title pt will verbalize feeling some improvement in overall mobility through ADLs.    Baseline overall feeling limited and trying to begin overall health at this point    Time 3    Period Weeks    Status New    Target Date 02/20/21             PT Long Term Goals - 02/03/21 1542      PT LONG TERM GOAL #1   Title pt will be able to navigate stairs at her home with confidence and necessary strength for reciprocal pattern    Baseline 5 steps without hand rails    Time 8    Period Weeks    Status New    Target Date 03/27/21      PT LONG TERM GOAL #2   Title 5TSTS to improve by MDC of 4s    Baseline 23s at eval, community dwelling norm for age group is 11.4    Time 8    Period Weeks    Status New    Target Date 03/27/21      PT LONG TERM GOAL #3   Title BERG to improve by MDC of 3 points    Baseline see flowsheet    Time 8    Period Weeks    Status New    Target Date 03/27/21      PT LONG TERM GOAL #4   Title 6MWT to improve by MDC of 191 ft    Baseline age appropriate would be 1765    Time 8    Period Weeks    Status New    Target Date 03/27/21                 Plan - 02/04/21 0929    Clinical Impression Statement inom rot Lt ant addressed with MET and was able to lay Lt LE extended in supine following.    PT Treatment/Interventions ADLs/Self Care Home Management;Aquatic Therapy;Cryotherapy;Electrical Stimulation;Moist Heat;Balance training;Therapeutic exercise;Therapeutic activities;Functional mobility training;Stair training;Gait training;Neuromuscular re-education;Patient/family education;Manual techniques;Taping;Dry needling;Passive range of motion    PT Next Visit Plan recheck  leg length    PT Home Exercise Plan [facility]    Consulted and Agree with Plan of Care Patient            Patient will benefit from skilled therapeutic intervention in order to improve the following deficits and impairments:  Decreased strength,Postural dysfunction,Difficulty walking,Decreased activity tolerance,Decreased mobility,Decreased balance,Pain,Decreased endurance  Visit Diagnosis: Chronic pain of left knee  Muscle weakness (generalized)  Difficulty in walking, not elsewhere classified     Problem List Patient Active Problem List   Diagnosis Date Noted  . Psoriasis 01/15/2021  . Chronic pain of left knee 01/15/2021  . Ventral hernia without obstruction or gangrene 01/15/2021  . Class 3 severe obesity with serious comorbidity and body mass index (BMI) of 40.0 to 44.9 in adult [facility]) 01/15/2019  . Acne rosacea 05/10/2016  . Multinodular goiter 02/27/2016  . 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 Prediabetes 07/30/2015  . Perforation of sigmoid colon ([place]) 02/20/2015  . Diverticulitis of large intestine with abscess without bleeding 02/18/2015  . OSA (obstructive sleep apnea) 05/09/2013  . GASTROENTERITIS 08/18/2010  . RAYNAUD'S SYNDROME 02/17/2009  . PLANTAR FASCIITIS 02/17/2009  . Asthma 06/12/2008  . Hyperlipidemia 09/04/2007  . DEVIATED SEPTUM 09/04/2007  . ALLERGIC RHINITIS 09/04/2007  . GERD 09/04/2007  . OSTEOARTHRITIS 09/04/2007  . HEADACHE 09/04/2007    Tenika Keeran C. Mellie Buccellato PT, DPT 02/04/21 10:46 AM   [facility] [place], [place], [address] Phone: [phone]   Fax:  [phone]  Name: Dori Devino [facility] MRN: [id number] Date of Birth: [birth year]

## 2021-02-05 NOTE — Telephone Encounter (Signed)
The request is ready to fax

## 2021-02-09 NOTE — Telephone Encounter (Signed)
Pt order for a walker was faxed to [place]

## 2021-02-10 ENCOUNTER — Other Ambulatory Visit: Payer: Self-pay

## 2021-02-10 ENCOUNTER — Ambulatory Visit (HOSPITAL_BASED_OUTPATIENT_CLINIC_OR_DEPARTMENT_OTHER): Payer: BC Managed Care – PPO | Admitting: Physical Therapy

## 2021-02-10 ENCOUNTER — Encounter (HOSPITAL_BASED_OUTPATIENT_CLINIC_OR_DEPARTMENT_OTHER): Payer: Self-pay | Admitting: Physical Therapy

## 2021-02-10 DIAGNOSIS — M6281 Muscle weakness (generalized): Secondary | ICD-10-CM | POA: Diagnosis not present

## 2021-02-10 DIAGNOSIS — G8929 Other chronic pain: Secondary | ICD-10-CM | POA: Diagnosis not present

## 2021-02-10 DIAGNOSIS — R262 Difficulty in walking, not elsewhere classified: Secondary | ICD-10-CM | POA: Diagnosis not present

## 2021-02-10 DIAGNOSIS — M25562 Pain in left knee: Secondary | ICD-10-CM | POA: Diagnosis not present

## 2021-02-10 NOTE — Therapy (Signed)
[place], [place], [address] Phone: [phone]   Fax:  [phone]  Physical Therapy Treatment  Patient Details  Name: Alison Best [facility] MRN: [id number] Date of Birth: [birth year] Referring Provider (PT): Laurey Morale, MD   Encounter Date: 02/10/2021   PT End of Session - 02/10/21 1433    Visit Number 4    Number of Visits 17    Date for PT Re-Evaluation 03/27/21    Authorization Type BCBS 30 VL    PT Start Time 1348    PT Stop Time 1430    PT Time Calculation (min) 42 min    Activity Tolerance Patient tolerated treatment well    Behavior During Therapy [facility] for tasks assessed/performed           Past Medical History:  Diagnosis Date  . Anal fissure   . Arthritis   . Bursitis   . Constipation   . COPD (chronic obstructive pulmonary disease) ([place])   . Diabetes mellitus without complication ([place])    sees Dr. Cruzita Lederer   . Diverticulitis of colon 02/2015  . Ectopic pregnancy   . Emphysema of lung ([place])   . Frozen shoulder   . Heart murmur   . Hernia, abdominal   . Hyperlipidemia   . Hyperthyroidism   . Infertility, female   . Irregular heartbeat   . Joint pain   . Obesity   . Osteoarthritis   . Sleep apnea   . Swallowing difficulty   . Thyroid disease    hyperthyroid    Past Surgical History:  Procedure Laterality Date  . APPENDECTOMY    . CESAREAN SECTION    . COLONOSCOPY  09-10-10   per Dr. Acquanetta Sit, diverticulosis of descending colon and sigmoid, internal hemorrhoids, repeat in 10 yrs  . COLOSTOMY N/A 02/20/2015   Procedure: COLOSTOMY;  Surgeon: Jackolyn Confer, MD;  [facility];  Service: General;  Laterality: N/A;  . COLOSTOMY REVISION  02/20/2015   Procedure: Sigmoid Colectomy;  Surgeon: Jackolyn Confer, MD;  [facility];  Service: General;;  . CYSTOSCOPY N/A 02/20/2015   Procedure: Consuela Mimes;  Surgeon: Kathie Rhodes, MD;  [facility];  Service:  Urology;  Laterality: N/A;  . DILATION AND CURETTAGE OF UTERUS     x3  . ILEO LOOP COLOSTOMY CLOSURE N/A 09/26/2015   Procedure: LAPAROSCOPIC LYSIS OF ADHESIONS, COLOSTOMY CLOSURE;  Surgeon: Jackolyn Confer, MD;  [facility];  Service: General;  Laterality: N/A;  . LAPAROTOMY N/A 02/20/2015   Procedure: Emergency EXPLORATORY and drainiage of interabdominal abcesses;  Surgeon: Jackolyn Confer, MD;  [facility];  Service: General;  Laterality: N/A;  . SALPINGOOPHORECTOMY Left 02/20/2015   Procedure: SALPINGO OOPHORECTOMY;  Surgeon: Jackolyn Confer, MD;  [facility];  Service: General;  Laterality: Left;  . TONSILLECTOMY  1980    There were no vitals filed for this visit.   Subjective Assessment - 02/10/21 1349    Subjective A little popping still in Lt hip. i have generally felt my whole Lt leg be more unstable. This mornign felt a sharp, stabbing pain in Lt groin. I added some tape below the knee which helps, pain comes and goes.    Patient Stated Goals Sept 1st going on a trip to [place].get moving again & feeling better. improve balance, stairs  [facility] Adult PT Treatment/Exercise - 02/10/21 0001      Knee/Hip Exercises: Aerobic   Nustep 5 min L4 UE & LE      Knee/Hip Exercises: Standing   Heel Raises Limitations toes turned out    Abduction Limitations flexed at 90, abd in turnout    Extension Limitations hinged foward- Lt hip ext toe taps    Gait Training Lt hip extension, heel/toe, trunk rotation    Other Standing Knee Exercises lateral weight shift to Lt leg on airex beam    Other Standing Knee Exercises ball bw knees, Lt HS curls                    PT Short Term Goals - 01/28/21 1336      PT SHORT TERM GOAL #1   Title pt will be independent with HEP as it has been established in the short term.    Baseline will progress as appropriate    Time 3    Period Weeks    Status New    Target Date  02/20/21      PT SHORT TERM GOAL #2   Title pt will verbalize feeling some improvement in overall mobility through ADLs.    Baseline overall feeling limited and trying to begin overall health at this point    Time 3    Period Weeks    Status New    Target Date 02/20/21             PT Long Term Goals - 02/03/21 1542      PT LONG TERM GOAL #1   Title pt will be able to navigate stairs at her home with confidence and necessary strength for reciprocal pattern    Baseline 5 steps without hand rails    Time 8    Period Weeks    Status New    Target Date 03/27/21      PT LONG TERM GOAL #2   Title 5TSTS to improve by MDC of 4s    Baseline 23s at eval, community dwelling norm for age group is 11.4    Time 8    Period Weeks    Status New    Target Date 03/27/21      PT LONG TERM GOAL #3   Title BERG to improve by MDC of 3 points    Baseline see flowsheet    Time 8    Period Weeks    Status New    Target Date 03/27/21      PT LONG TERM GOAL #4   Title 6MWT to improve by MDC of 191 ft    Baseline age appropriate would be 1765    Time 8    Period Weeks    Status New    Target Date 03/27/21                 Plan - 02/10/21 1409    Clinical Impression Statement Panus creating hip flexion resistance on Lt side resutling in Lt anterior pelvic rotation, popping in Lt groin and pain in Lt knee. improved pain with trunk rotation in gait requiring tactile cuing. Will work in pool on Friday.    PT Treatment/Interventions ADLs/Self Care Home Management;Aquatic Therapy;Cryotherapy;Electrical Stimulation;Moist Heat;Balance training;Therapeutic exercise;Therapeutic activities;Functional mobility training;Stair training;Gait training;Neuromuscular re-education;Patient/family education;Manual techniques;Taping;Dry needling;Passive range of motion    PT Next Visit Plan aquatic, cont to activate Lt hip ext    [place]  and Agree with Plan of Care  Patient           Patient will benefit from skilled therapeutic intervention in order to improve the following deficits and impairments:  Decreased strength,Postural dysfunction,Difficulty walking,Decreased activity tolerance,Decreased mobility,Decreased balance,Pain,Decreased endurance  Visit Diagnosis: Chronic pain of left knee  Muscle weakness (generalized)  Difficulty in walking, not elsewhere classified     Problem List Patient Active Problem List   Diagnosis Date Noted  . Psoriasis 01/15/2021  . Chronic pain of left knee 01/15/2021  . Ventral hernia without obstruction or gangrene 01/15/2021  . Class 3 severe obesity with serious comorbidity and body mass index (BMI) of 40.0 to 44.9 in adult [facility]) 01/15/2019  . Acne rosacea 05/10/2016  . Multinodular goiter 02/27/2016  . 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 Prediabetes 07/30/2015  . Perforation of sigmoid colon ([place]) 02/20/2015  . Diverticulitis of large intestine with abscess without bleeding 02/18/2015  . OSA (obstructive sleep apnea) 05/09/2013  . GASTROENTERITIS 08/18/2010  . RAYNAUD'S SYNDROME 02/17/2009  . PLANTAR FASCIITIS 02/17/2009  . Asthma 06/12/2008  . Hyperlipidemia 09/04/2007  . DEVIATED SEPTUM 09/04/2007  . ALLERGIC RHINITIS 09/04/2007  . GERD 09/04/2007  . OSTEOARTHRITIS 09/04/2007  . HEADACHE 09/04/2007   Alison Best C. Brando Taves PT, DPT 02/10/21 7:48 PM   [facility] [address] [place], [place], [address] Phone: [phone]   Fax:  [phone]  Name: Janeth Terry [facility] MRN: [id number] Date of Birth: 1956/01/27

## 2021-02-13 ENCOUNTER — Other Ambulatory Visit: Payer: Self-pay

## 2021-02-13 ENCOUNTER — Inpatient Hospital Stay: Admission: RE | Admit: 2021-02-13 | Payer: BC Managed Care – PPO | Source: Ambulatory Visit

## 2021-02-13 ENCOUNTER — Ambulatory Visit (HOSPITAL_BASED_OUTPATIENT_CLINIC_OR_DEPARTMENT_OTHER): Payer: BC Managed Care – PPO | Admitting: Physical Therapy

## 2021-02-13 ENCOUNTER — Encounter (HOSPITAL_BASED_OUTPATIENT_CLINIC_OR_DEPARTMENT_OTHER): Payer: Self-pay | Admitting: Physical Therapy

## 2021-02-13 DIAGNOSIS — M6281 Muscle weakness (generalized): Secondary | ICD-10-CM | POA: Diagnosis not present

## 2021-02-13 DIAGNOSIS — G8929 Other chronic pain: Secondary | ICD-10-CM

## 2021-02-13 DIAGNOSIS — R262 Difficulty in walking, not elsewhere classified: Secondary | ICD-10-CM

## 2021-02-13 DIAGNOSIS — M25562 Pain in left knee: Secondary | ICD-10-CM | POA: Diagnosis not present

## 2021-02-13 DIAGNOSIS — Z1231 Encounter for screening mammogram for malignant neoplasm of breast: Secondary | ICD-10-CM

## 2021-02-13 NOTE — Therapy (Signed)
[place], [place], [address] Phone: [phone]   Fax:  [phone]  Physical Therapy Treatment  Patient Details  Name: Alison Best [facility] MRN: [id number] Date of Birth: [birth year] Referring Provider (PT): Laurey Morale, MD   Encounter Date: 02/13/2021   PT End of Session - 02/13/21 0850    Visit Number 5    Number of Visits 17    Date for PT Re-Evaluation 03/27/21    Authorization Type BCBS 30 VL    PT Start Time 0846    PT Stop Time 0925    PT Time Calculation (min) 39 min    Activity Tolerance Patient tolerated treatment well    Behavior During Therapy [facility] for tasks assessed/performed           Past Medical History:  Diagnosis Date  . Anal fissure   . Arthritis   . Bursitis   . Constipation   . COPD (chronic obstructive pulmonary disease) ([place])   . Diabetes mellitus without complication ([place])    sees Dr. Cruzita Lederer   . Diverticulitis of colon 02/2015  . Ectopic pregnancy   . Emphysema of lung ([place])   . Frozen shoulder   . Heart murmur   . Hernia, abdominal   . Hyperlipidemia   . Hyperthyroidism   . Infertility, female   . Irregular heartbeat   . Joint pain   . Obesity   . Osteoarthritis   . Sleep apnea   . Swallowing difficulty   . Thyroid disease    hyperthyroid    Past Surgical History:  Procedure Laterality Date  . APPENDECTOMY    . CESAREAN SECTION    . COLONOSCOPY  09-10-10   per Dr. Acquanetta Sit, diverticulosis of descending colon and sigmoid, internal hemorrhoids, repeat in 10 yrs  . COLOSTOMY N/A 02/20/2015   Procedure: COLOSTOMY;  Surgeon: Jackolyn Confer, MD;  [facility];  Service: General;  Laterality: N/A;  . COLOSTOMY REVISION  02/20/2015   Procedure: Sigmoid Colectomy;  Surgeon: Jackolyn Confer, MD;  [facility];  Service: General;;  . CYSTOSCOPY N/A 02/20/2015   Procedure: Consuela Mimes;  Surgeon: Kathie Rhodes, MD;  [facility];  Service:  Urology;  Laterality: N/A;  . DILATION AND CURETTAGE OF UTERUS     x3  . ILEO LOOP COLOSTOMY CLOSURE N/A 09/26/2015   Procedure: LAPAROSCOPIC LYSIS OF ADHESIONS, COLOSTOMY CLOSURE;  Surgeon: Jackolyn Confer, MD;  [facility];  Service: General;  Laterality: N/A;  . LAPAROTOMY N/A 02/20/2015   Procedure: Emergency EXPLORATORY and drainiage of interabdominal abcesses;  Surgeon: Jackolyn Confer, MD;  [facility];  Service: General;  Laterality: N/A;  . SALPINGOOPHORECTOMY Left 02/20/2015   Procedure: SALPINGO OOPHORECTOMY;  Surgeon: Jackolyn Confer, MD;  [facility];  Service: General;  Laterality: Left;  . TONSILLECTOMY  1980    There were no vitals filed for this visit.   Subjective Assessment - 02/13/21 0847    Subjective Was sore after last session but feeling good. Did gentle silver fit yesterday. Knee has a little pain- maybe 2/10.    Patient Stated Goals Sept 1st going on a trip to [place].get moving again & feeling better. improve balance, stairs                             [facility] Adult PT Treatment/Exercise - 02/13/21 0001      Lumbar Exercises: Stretches   Other Lumbar  Stretch Exercise water weight behind knee to float leg into hip flexion      Lumbar Exercises: Aerobic   Other Aerobic Exercise laps around pool for warmup 4 min- fwd & back      Lumbar Exercises: Standing   Lifting Limitations small range golfer lift motion    Side Lunge Limitations bilat with water weight floating each hand    Wall Slides Limitations 2 weights under water- hip hinge/reach fwd    Shoulder Extension Limitations single water weight press down    Shoulder Adduction Limitations horiz abd/add & flx/ext water push    Other Standing Lumbar Exercises hip ext- nutral & t/o knee extended: only on Lt    Other Standing Lumbar Exercises lt hip flexion float to press back; Rt hip quick flexion to float back                    PT Short Term Goals -  02/13/21 1151      PT SHORT TERM GOAL #1   Title pt will be independent with HEP as it has been established in the short term.    Status Achieved      PT SHORT TERM GOAL #2   Title pt will verbalize feeling some improvement in overall mobility through ADLs.    Status Achieved             PT Long Term Goals - 02/03/21 1542      PT LONG TERM GOAL #1   Title pt will be able to navigate stairs at her home with confidence and necessary strength for reciprocal pattern    Baseline 5 steps without hand rails    Time 8    Period Weeks    Status New    Target Date 03/27/21      PT LONG TERM GOAL #2   Title 5TSTS to improve by MDC of 4s    Baseline 23s at eval, community dwelling norm for age group is 11.4    Time 8    Period Weeks    Status New    Target Date 03/27/21      PT LONG TERM GOAL #3   Title BERG to improve by MDC of 3 points    Baseline see flowsheet    Time 8    Period Weeks    Status New    Target Date 03/27/21      PT LONG TERM GOAL #4   Title 6MWT to improve by MDC of 191 ft    Baseline age appropriate would be 1765    Time 8    Period Weeks    Status New    Target Date 03/27/21                 Plan - 02/13/21 0917    Clinical Impression Statement Entered water via steps using bil hand rails. Exercises in depths 3'6"- 4', water temp 88-90 deg. water buoyance provided decreased pressure from gravitational pull for appropriate strengthening and safety in balance exercises. will do next 3 visits in pool and then to 1/week on land and will work independently in aquatic HEP- will adapt PRN. Good tolerance to aquatic exercises with some soreness in knee following but hip felt good.    PT Treatment/Interventions ADLs/Self Care Home Management;Aquatic Therapy;Cryotherapy;Electrical Stimulation;Moist Heat;Balance training;Therapeutic exercise;Therapeutic activities;Functional mobility training;Stair training;Gait training;Neuromuscular  re-education;Patient/family education;Manual techniques;Taping;Dry needling;Passive range of motion    PT Next Visit Plan aquatic, cont to activate Lt hip  ext    PT Home Exercise Plan [facility]    Consulted and Agree with Plan of Care Patient           Patient will benefit from skilled therapeutic intervention in order to improve the following deficits and impairments:  Decreased strength,Postural dysfunction,Difficulty walking,Decreased activity tolerance,Decreased mobility,Decreased balance,Pain,Decreased endurance  Visit Diagnosis: Chronic pain of left knee  Muscle weakness (generalized)  Difficulty in walking, not elsewhere classified     Problem List Patient Active Problem List   Diagnosis Date Noted  . Psoriasis 01/15/2021  . Chronic pain of left knee 01/15/2021  . Ventral hernia without obstruction or gangrene 01/15/2021  . Class 3 severe obesity with serious comorbidity and body mass index (BMI) of 40.0 to 44.9 in adult [facility]) 01/15/2019  . Acne rosacea 05/10/2016  . Multinodular goiter 02/27/2016  . 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 Colostomy in place [facility]) s/p colostomy reversal 09/26/15 09/26/2015  . Prediabetes 07/30/2015  . Perforation of sigmoid colon ([place]) 02/20/2015  . Diverticulitis of large intestine with abscess without bleeding 02/18/2015  . OSA (obstructive sleep apnea) 05/09/2013  . GASTROENTERITIS 08/18/2010  . RAYNAUD'S SYNDROME 02/17/2009  . PLANTAR FASCIITIS 02/17/2009  . Asthma 06/12/2008  . Hyperlipidemia 09/04/2007  . DEVIATED SEPTUM 09/04/2007  . ALLERGIC RHINITIS 09/04/2007  . GERD 09/04/2007  . OSTEOARTHRITIS 09/04/2007  . HEADACHE 09/04/2007    Debara Kamphuis C. Tasha Diaz PT, DPT 02/13/21 11:52 AM   [facility] [place], [place], [address] Phone: [phone]   Fax:  [phone]  Name: Tierrah Creger [facility] MRN: [id number] Date of Birth: 04-04-1956

## 2021-02-16 ENCOUNTER — Other Ambulatory Visit: Payer: Self-pay

## 2021-02-16 ENCOUNTER — Ambulatory Visit
Admission: RE | Admit: 2021-02-16 | Discharge: 2021-02-16 | Disposition: A | Discharge status: Home / Self Care | Payer: BC Managed Care – PPO | Source: Ambulatory Visit | Attending: Family Medicine | Admitting: Family Medicine

## 2021-02-16 DIAGNOSIS — Z1231 Encounter for screening mammogram for malignant neoplasm of breast: Secondary | ICD-10-CM | POA: Diagnosis not present

## 2021-02-17 ENCOUNTER — Other Ambulatory Visit: Payer: Self-pay | Admitting: Internal Medicine

## 2021-02-17 ENCOUNTER — Encounter (HOSPITAL_BASED_OUTPATIENT_CLINIC_OR_DEPARTMENT_OTHER): Payer: Self-pay | Admitting: Physical Therapy

## 2021-02-17 ENCOUNTER — Ambulatory Visit (HOSPITAL_BASED_OUTPATIENT_CLINIC_OR_DEPARTMENT_OTHER): Payer: BC Managed Care – PPO | Admitting: Physical Therapy

## 2021-02-17 DIAGNOSIS — R262 Difficulty in walking, not elsewhere classified: Secondary | ICD-10-CM | POA: Diagnosis not present

## 2021-02-17 DIAGNOSIS — G8929 Other chronic pain: Secondary | ICD-10-CM

## 2021-02-17 DIAGNOSIS — M6281 Muscle weakness (generalized): Secondary | ICD-10-CM

## 2021-02-17 DIAGNOSIS — M25562 Pain in left knee: Secondary | ICD-10-CM | POA: Diagnosis not present

## 2021-02-17 NOTE — Therapy (Signed)
[address] [place], [place], [address] Phone: [phone]   Fax:  [phone]  Physical Therapy Treatment  Patient Details  Name: Alison Best [facility] MRN: [id number] Date of Birth: 05-Feb-1956 Referring Provider (PT): Laurey Morale, MD   Encounter Date: 02/17/2021   PT End of Session - 02/17/21 1439    Visit Number 6    Number of Visits 17    Date for PT Re-Evaluation 03/27/21    Authorization Type BCBS 30 VL    PT Start Time 1435    PT Stop Time 1513    PT Time Calculation (min) 38 min    Activity Tolerance Patient tolerated treatment well    Behavior During Therapy [facility] for tasks assessed/performed           Past Medical History:  Diagnosis Date  . Anal fissure   . Arthritis   . Bursitis   . Constipation   . COPD (chronic obstructive pulmonary disease) ([place])   . Diabetes mellitus without complication ([place])    sees Dr. Cruzita Lederer   . Diverticulitis of colon 02/2015  . Ectopic pregnancy   . Emphysema of lung ([place])   . Frozen shoulder   . Heart murmur   . Hernia, abdominal   . Hyperlipidemia   . Hyperthyroidism   . Infertility, female   . Irregular heartbeat   . Joint pain   . Obesity   . Osteoarthritis   . Sleep apnea   . Swallowing difficulty   . Thyroid disease    hyperthyroid    Past Surgical History:  Procedure Laterality Date  . APPENDECTOMY    . CESAREAN SECTION    . COLONOSCOPY  09-10-10   per Dr. Acquanetta Sit, diverticulosis of descending colon and sigmoid, internal hemorrhoids, repeat in 10 yrs  . COLOSTOMY N/A 02/20/2015   Procedure: COLOSTOMY;  Surgeon: Jackolyn Confer, MD;  [facility];  Service: General;  Laterality: N/A;  . COLOSTOMY REVISION  02/20/2015   Procedure: Sigmoid Colectomy;  Surgeon: Jackolyn Confer, MD;  [facility];  Service: General;;  . CYSTOSCOPY N/A 02/20/2015   Procedure: Consuela Mimes;  Surgeon: Kathie Rhodes, MD;  [facility];  Service:  Urology;  Laterality: N/A;  . DILATION AND CURETTAGE OF UTERUS     x3  . ILEO LOOP COLOSTOMY CLOSURE N/A 09/26/2015   Procedure: LAPAROSCOPIC LYSIS OF ADHESIONS, COLOSTOMY CLOSURE;  Surgeon: Jackolyn Confer, MD;  [facility];  Service: General;  Laterality: N/A;  . LAPAROTOMY N/A 02/20/2015   Procedure: Emergency EXPLORATORY and drainiage of interabdominal abcesses;  Surgeon: Jackolyn Confer, MD;  [facility];  Service: General;  Laterality: N/A;  . SALPINGOOPHORECTOMY Left 02/20/2015   Procedure: SALPINGO OOPHORECTOMY;  Surgeon: Jackolyn Confer, MD;  [facility];  Service: General;  Laterality: Left;  . TONSILLECTOMY  1980    There were no vitals filed for this visit.   Subjective Assessment - 02/17/21 1440    Subjective tired after last session but felt good, moved tape down to petallar tendon, having some isses with left hip                             [facility] Adult PT Treatment/Exercise - 02/17/21 0001      Exercises   Exercises Other Exercises    Other Exercises  See PT note for aquatic ex  PT Short Term Goals - 02/13/21 1151      PT SHORT TERM GOAL #1   Title pt will be independent with HEP as it has been established in the short term.    Status Achieved      PT SHORT TERM GOAL #2   Title pt will verbalize feeling some improvement in overall mobility through ADLs.    Status Achieved             PT Long Term Goals - 02/03/21 1542      PT LONG TERM GOAL #1   Title pt will be able to navigate stairs at her home with confidence and necessary strength for reciprocal pattern    Baseline 5 steps without hand rails    Time 8    Period Weeks    Status New    Target Date 03/27/21      PT LONG TERM GOAL #2   Title 5TSTS to improve by MDC of 4s    Baseline 23s at eval, community dwelling norm for age group is 11.4    Time 8    Period Weeks    Status New    Target Date 03/27/21      PT LONG TERM GOAL #3    Title BERG to improve by MDC of 3 points    Baseline see flowsheet    Time 8    Period Weeks    Status New    Target Date 03/27/21      PT LONG TERM GOAL #4   Title 6MWT to improve by MDC of 191 ft    Baseline age appropriate would be 1765    Time 8    Period Weeks    Status New    Target Date 03/27/21          aquatic ON:GEXBMWU and sideways walking, backward walking with forceful extension, forward reach with water weight and hinge, lateral lunges with a water weight press down, flutter kicks using kick board (aqua sprinter caused shoulder discomfort), hip ext water weight behind knee, hip flex/kicks.  Standing dynamic balance challenges: unilateral stance       Plan - 02/17/21 1736    Clinical Impression Statement Entered water via steps using bil HR. Ex in depths 3'6"-4', water temp 88-90 deg. Water buoyancy decr pressure from gravitational pull for appropriate strengthening and safety in balance exercises. Removed use of UEs for balance assist in walking which made control more difficult. Flutter kicks with float resulted in turn to her Lt side due to uneven weight distribution.    PT Treatment/Interventions ADLs/Self Care Home Management;Aquatic Therapy;Cryotherapy;Electrical Stimulation;Moist Heat;Balance training;Therapeutic exercise;Therapeutic activities;Functional mobility training;Stair training;Gait training;Neuromuscular re-education;Patient/family education;Manual techniques;Taping;Dry needling;Passive range of motion    PT Next Visit Plan aquatic progressions    PT Home Exercise Plan [facility]    Consulted and Agree with Plan of Care Patient           Patient will benefit from skilled therapeutic intervention in order to improve the following deficits and impairments:  Decreased strength,Postural dysfunction,Difficulty walking,Decreased activity tolerance,Decreased mobility,Decreased balance,Pain,Decreased endurance  Visit Diagnosis: Chronic pain of left  knee  Muscle weakness (generalized)  Difficulty in walking, not elsewhere classified     Problem List Patient Active Problem List   Diagnosis Date Noted  . Psoriasis 01/15/2021  . Chronic pain of left knee 01/15/2021  . Ventral hernia without obstruction or gangrene 01/15/2021  . Class 3 severe obesity with serious comorbidity and body mass index (BMI)  of 40.0 to 44.9 in adult [facility]) 01/15/2019  . Acne rosacea 05/10/2016  . Multinodular goiter 02/27/2016  . 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 Prediabetes 07/30/2015  . Perforation of sigmoid colon ([place]) 02/20/2015  . Diverticulitis of large intestine with abscess without bleeding 02/18/2015  . OSA (obstructive sleep apnea) 05/09/2013  . GASTROENTERITIS 08/18/2010  . RAYNAUD'S SYNDROME 02/17/2009  . PLANTAR FASCIITIS 02/17/2009  . Asthma 06/12/2008  . Hyperlipidemia 09/04/2007  . DEVIATED SEPTUM 09/04/2007  . ALLERGIC RHINITIS 09/04/2007  . GERD 09/04/2007  . OSTEOARTHRITIS 09/04/2007  . HEADACHE 09/04/2007   Jia Dottavio C. Alynn Ellithorpe PT, DPT 02/17/21 5:40 PM   [facility] [address] [place], [place], [address] Phone: [phone]   Fax:  [phone]  Name: Tattiana Fakhouri [facility] MRN: [id number] Date of Birth: [birth year]

## 2021-02-20 ENCOUNTER — Encounter (HOSPITAL_BASED_OUTPATIENT_CLINIC_OR_DEPARTMENT_OTHER): Payer: Self-pay | Admitting: Physical Therapy

## 2021-02-20 ENCOUNTER — Ambulatory Visit (HOSPITAL_BASED_OUTPATIENT_CLINIC_OR_DEPARTMENT_OTHER): Payer: BC Managed Care – PPO | Admitting: Physical Therapy

## 2021-02-20 ENCOUNTER — Other Ambulatory Visit: Payer: Self-pay

## 2021-02-20 DIAGNOSIS — M6281 Muscle weakness (generalized): Secondary | ICD-10-CM | POA: Diagnosis not present

## 2021-02-20 DIAGNOSIS — R262 Difficulty in walking, not elsewhere classified: Secondary | ICD-10-CM | POA: Diagnosis not present

## 2021-02-20 DIAGNOSIS — M25562 Pain in left knee: Secondary | ICD-10-CM

## 2021-02-20 DIAGNOSIS — G8929 Other chronic pain: Secondary | ICD-10-CM

## 2021-02-20 NOTE — Therapy (Signed)
[place], [place], [address] Phone: [phone]   Fax:  [phone]  Physical Therapy Treatment  Patient Details  Name: Alison Best [facility] MRN: [id number] Date of Birth: October 29, 1955 Referring Provider (PT): Laurey Morale, MD   Encounter Date: 02/20/2021   PT End of Session - 02/20/21 1206    Visit Number 7    Number of Visits 17    Date for PT Re-Evaluation 03/27/21    Authorization Type BCBS 30 VL    PT Start Time 1155    PT Stop Time 1230    PT Time Calculation (min) 35 min    Activity Tolerance Patient tolerated treatment well    Behavior During Therapy [facility] for tasks assessed/performed           Past Medical History:  Diagnosis Date  . Anal fissure   . Arthritis   . Bursitis   . Constipation   . COPD (chronic obstructive pulmonary disease) ([place])   . Diabetes mellitus without complication ([place])    sees Dr. Cruzita Lederer   . Diverticulitis of colon 02/2015  . Ectopic pregnancy   . Emphysema of lung ([place])   . Frozen shoulder   . Heart murmur   . Hernia, abdominal   . Hyperlipidemia   . Hyperthyroidism   . Infertility, female   . Irregular heartbeat   . Joint pain   . Obesity   . Osteoarthritis   . Sleep apnea   . Swallowing difficulty   . Thyroid disease    hyperthyroid    Past Surgical History:  Procedure Laterality Date  . APPENDECTOMY    . CESAREAN SECTION    . COLONOSCOPY  09-10-10   per Dr. Acquanetta Sit, diverticulosis of descending colon and sigmoid, internal hemorrhoids, repeat in 10 yrs  . COLOSTOMY N/A 02/20/2015   Procedure: COLOSTOMY;  Surgeon: Jackolyn Confer, MD;  [facility]: [facility];  Service: General;  Laterality: N/A;  . COLOSTOMY REVISION  02/20/2015   Procedure: Sigmoid Colectomy;  Surgeon: Jackolyn Confer, MD;  Location: [facility];  Service: General;;  . CYSTOSCOPY N/A 02/20/2015   Procedure: Consuela Mimes;  Surgeon: Kathie Rhodes, MD;  [facility];  Service:  Urology;  Laterality: N/A;  . DILATION AND CURETTAGE OF UTERUS     x3  . ILEO LOOP COLOSTOMY CLOSURE N/A 09/26/2015   Procedure: LAPAROSCOPIC LYSIS OF ADHESIONS, COLOSTOMY CLOSURE;  Surgeon: Jackolyn Confer, MD;  [facility];  Service: General;  Laterality: N/A;  . LAPAROTOMY N/A 02/20/2015   Procedure: Emergency EXPLORATORY and drainiage of interabdominal abcesses;  Surgeon: Jackolyn Confer, MD;  [facility];  Service: General;  Laterality: N/A;  . SALPINGOOPHORECTOMY Left 02/20/2015   Procedure: SALPINGO OOPHORECTOMY;  Surgeon: Jackolyn Confer, MD;  [facility];  Service: General;  Laterality: Left;  . TONSILLECTOMY  1980    There were no vitals filed for this visit.   Subjective Assessment - 02/20/21 1205    Subjective Was able to walk around two small museums yesterday. Just feels tight around the hip.    Patient Stated Goals Sept 1st going on a trip to [place].get moving again & feeling better. improve balance, stairs    Currently in Pain? No/denies                             [facility] Adult PT Treatment/Exercise - 02/20/21 0001      Exercises   Other Exercises  See PT note for aquatic ex            walk laps for warmup *prior to PT in pool area Square noodle- Lt hip ext press down Square noodle- tandem balance Square noodle on floor heel raises+ gastroc stretch Noodle in hands- hip hinge, press down & glut set to stand Retro walking- kick board for resist HS curls- elbows on kick board Standing hip circles- hands on kick board Prone flutter kicks on side of pool         PT Short Term Goals - 02/13/21 1151      PT SHORT TERM GOAL #1   Title pt will be independent with HEP as it has been established in the short term.    Status Achieved      PT SHORT TERM GOAL #2   Title pt will verbalize feeling some improvement in overall mobility through ADLs.    Status Achieved             PT Long Term Goals - 02/03/21 1542       PT LONG TERM GOAL #1   Title pt will be able to navigate stairs at her home with confidence and necessary strength for reciprocal pattern    Baseline 5 steps without hand rails    Time 8    Period Weeks    Status New    Target Date 03/27/21      PT LONG TERM GOAL #2   Title 5TSTS to improve by MDC of 4s    Baseline 23s at eval, community dwelling norm for age group is 11.4    Time 8    Period Weeks    Status New    Target Date 03/27/21      PT LONG TERM GOAL #3   Title BERG to improve by MDC of 3 points    Baseline see flowsheet    Time 8    Period Weeks    Status New    Target Date 03/27/21      PT LONG TERM GOAL #4   Title 6MWT to improve by MDC of 191 ft    Baseline age appropriate would be 1765    Time 8    Period Weeks    Status New    Target Date 03/27/21                 Plan - 02/20/21 1255    Clinical Impression Statement Added balance activities on pool noodle and increased control required in LE exercises. Did have an abdominal spasm after trying to float in supine. POC to 1/week in pool and will continue with land workout classes.    PT Treatment/Interventions ADLs/Self Care Home Management;Aquatic Therapy;Cryotherapy;Electrical Stimulation;Moist Heat;Balance training;Therapeutic exercise;Therapeutic activities;Functional mobility training;Stair training;Gait training;Neuromuscular re-education;Patient/family education;Manual techniques;Taping;Dry needling;Passive range of motion    PT Next Visit Plan cont aquatic- det date for land re-eval    PT Home Exercise Plan [facility]    Consulted and Agree with Plan of Care Patient           Patient will benefit from skilled therapeutic intervention in order to improve the following deficits and impairments:  Decreased strength,Postural dysfunction,Difficulty walking,Decreased activity tolerance,Decreased mobility,Decreased balance,Pain,Decreased endurance  Visit Diagnosis: Chronic pain of left  knee  Muscle weakness (generalized)  Difficulty in walking, not elsewhere classified     Problem List Patient Active Problem List   Diagnosis Date Noted  . Psoriasis 01/15/2021  . Chronic pain of left knee  01/15/2021  . Ventral hernia without obstruction or gangrene 01/15/2021  . Class 3 severe obesity with serious comorbidity and body mass index (BMI) of 40.0 to 44.9 in adult [facility]) 01/15/2019  . Acne rosacea 05/10/2016  . Multinodular goiter 02/27/2016  . 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 Prediabetes 07/30/2015  . Perforation of sigmoid colon ([place]) 02/20/2015  . Diverticulitis of large intestine with abscess without bleeding 02/18/2015  . OSA (obstructive sleep apnea) 05/09/2013  . GASTROENTERITIS 08/18/2010  . RAYNAUD'S SYNDROME 02/17/2009  . PLANTAR FASCIITIS 02/17/2009  . Asthma 06/12/2008  . Hyperlipidemia 09/04/2007  . DEVIATED SEPTUM 09/04/2007  . ALLERGIC RHINITIS 09/04/2007  . GERD 09/04/2007  . OSTEOARTHRITIS 09/04/2007  . HEADACHE 09/04/2007    Carlethia Mesquita C. Yogesh Cominsky PT, DPT 02/20/21 12:57 PM   [facility] [address], [place], [address] Phone: [phone]   Fax:  [phone]  Name: Alison Best [facility] MRN: [id number] Date of Birth: January 29, 1956

## 2021-02-23 ENCOUNTER — Ambulatory Visit (HOSPITAL_BASED_OUTPATIENT_CLINIC_OR_DEPARTMENT_OTHER): Payer: BC Managed Care – PPO | Attending: Family Medicine | Admitting: Physical Therapy

## 2021-02-23 ENCOUNTER — Other Ambulatory Visit: Payer: Self-pay

## 2021-02-23 ENCOUNTER — Encounter (HOSPITAL_BASED_OUTPATIENT_CLINIC_OR_DEPARTMENT_OTHER): Payer: Self-pay | Admitting: Physical Therapy

## 2021-02-23 DIAGNOSIS — M6281 Muscle weakness (generalized): Secondary | ICD-10-CM

## 2021-02-23 DIAGNOSIS — R262 Difficulty in walking, not elsewhere classified: Secondary | ICD-10-CM

## 2021-02-23 DIAGNOSIS — M25562 Pain in left knee: Secondary | ICD-10-CM | POA: Diagnosis not present

## 2021-02-23 DIAGNOSIS — G8929 Other chronic pain: Secondary | ICD-10-CM | POA: Insufficient documentation

## 2021-02-23 NOTE — Therapy (Signed)
[place], [place], [address] Phone: [phone]   Fax:  [phone]  Physical Therapy Treatment  Patient Details  Name: Alison Best [facility] MRN: [id number] Date of Birth: [birth year] Referring Provider (PT): Laurey Morale, MD   Encounter Date: 02/23/2021   PT End of Session - 02/23/21 1024    Visit Number 8    Number of Visits 17    Date for PT Re-Evaluation 03/27/21    Authorization Type BCBS 30 VL    PT Start Time 1012    PT Stop Time 1057    PT Time Calculation (min) 45 min    Activity Tolerance Patient tolerated treatment well    Behavior During Therapy [facility] for tasks assessed/performed           Past Medical History:  Diagnosis Date  . Anal fissure   . Arthritis   . Bursitis   . Constipation   . COPD (chronic obstructive pulmonary disease) ([place])   . Diabetes mellitus without complication ([place])    sees Dr. Cruzita Lederer   . Diverticulitis of colon 02/2015  . Ectopic pregnancy   . Emphysema of lung ([place])   . Frozen shoulder   . Heart murmur   . Hernia, abdominal   . Hyperlipidemia   . Hyperthyroidism   . Infertility, female   . Irregular heartbeat   . Joint pain   . Obesity   . Osteoarthritis   . Sleep apnea   . Swallowing difficulty   . Thyroid disease    hyperthyroid    Past Surgical History:  Procedure Laterality Date  . APPENDECTOMY    . CESAREAN SECTION    . COLONOSCOPY  09-10-10   per Dr. Acquanetta Sit, diverticulosis of descending colon and sigmoid, internal hemorrhoids, repeat in 10 yrs  . COLOSTOMY N/A 02/20/2015   Procedure: COLOSTOMY;  Surgeon: Jackolyn Confer, MD;  [facility];  Service: General;  Laterality: N/A;  . COLOSTOMY REVISION  02/20/2015   Procedure: Sigmoid Colectomy;  Surgeon: Jackolyn Confer, MD;  [facility];  Service: General;;  . CYSTOSCOPY N/A 02/20/2015   Procedure: Consuela Mimes;  Surgeon: Kathie Rhodes, MD;  [facility];  Service:  Urology;  Laterality: N/A;  . DILATION AND CURETTAGE OF UTERUS     x3  . ILEO LOOP COLOSTOMY CLOSURE N/A 09/26/2015   Procedure: LAPAROSCOPIC LYSIS OF ADHESIONS, COLOSTOMY CLOSURE;  Surgeon: Jackolyn Confer, MD;  [facility];  Service: General;  Laterality: N/A;  . LAPAROTOMY N/A 02/20/2015   Procedure: Emergency EXPLORATORY and drainiage of interabdominal abcesses;  Surgeon: Jackolyn Confer, MD;  [facility];  Service: General;  Laterality: N/A;  . SALPINGOOPHORECTOMY Left 02/20/2015   Procedure: SALPINGO OOPHORECTOMY;  Surgeon: Jackolyn Confer, MD;  [facility];  Service: General;  Laterality: Left;  . TONSILLECTOMY  1980    There were no vitals filed for this visit.   Subjective Assessment - 02/23/21 1023    Subjective I can do more in yoga than I used to. occasional dull ache in knee. on and off twinges in hip. got new rollator and walked around in home and outside a good bit over the weekend.    Patient Stated Goals Sept 1st going on a trip to [place].get moving again & feeling better. improve balance, stairs    Currently in Pain? No/denies  [place] Adult PT Treatment/Exercise - 02/23/21 0001      Lumbar Exercises: Standing   Shoulder Extension Limitations single water weight press down    Shoulder Adduction Limitations horiz abd/add wieghts under water    Other Standing Lumbar Exercises hip abd/ER water for resist    Other Standing Lumbar Exercises Lt lat step up botom step; tandem balance on large square noodle      Lumbar Exercises: Supine   Other Supine Lumbar Exercises glutter kicks      Lumbar Exercises: Prone   Other Prone Lumbar Exercises prone on noodle-flutter kicks to horizontal in prone                    PT Short Term Goals - 02/13/21 1151      PT SHORT TERM GOAL #1   Title pt will be independent with HEP as it has been established in the short term.    Status Achieved      PT SHORT  TERM GOAL #2   Title pt will verbalize feeling some improvement in overall mobility through ADLs.    Status Achieved             PT Long Term Goals - 02/03/21 1542      PT LONG TERM GOAL #1   Title pt will be able to navigate stairs at her home with confidence and necessary strength for reciprocal pattern    Baseline 5 steps without hand rails    Time 8    Period Weeks    Status New    Target Date 03/27/21      PT LONG TERM GOAL #2   Title 5TSTS to improve by MDC of 4s    Baseline 23s at eval, community dwelling norm for age group is 11.4    Time 8    Period Weeks    Status New    Target Date 03/27/21      PT LONG TERM GOAL #3   Title BERG to improve by MDC of 3 points    Baseline see flowsheet    Time 8    Period Weeks    Status New    Target Date 03/27/21      PT LONG TERM GOAL #4   Title 6MWT to improve by MDC of 191 ft    Baseline age appropriate would be 1765    Time 8    Period Weeks    Status New    Target Date 03/27/21                 Plan - 02/23/21 1058    Clinical Impression Statement Entered water via steps using bil HR. Ex in depths 3'6"-4', water temp 88-90 deg. Water buoyancy decr pressure from gravitational pull for appropriate strengthening and safety in balance exercises. Moved to more shallow water for step ups which did not incr pain but could feel work in hips. better control of core in floating prone and supine wihtout spasm today.    PT Treatment/Interventions ADLs/Self Care Home Management;Aquatic Therapy;Cryotherapy;Electrical Stimulation;Moist Heat;Balance training;Therapeutic exercise;Therapeutic activities;Functional mobility training;Stair training;Gait training;Neuromuscular re-education;Patient/family education;Manual techniques;Taping;Dry needling;Passive range of motion    PT Next Visit Plan cont aquatic- det date for land re-eval    PT Home Exercise Plan [facility]    Consulted and Agree with Plan of Care Patient            Patient will benefit from skilled therapeutic intervention in order to improve the following deficits  and impairments:  Decreased strength,Postural dysfunction,Difficulty walking,Decreased activity tolerance,Decreased mobility,Decreased balance,Pain,Decreased endurance  Visit Diagnosis: Chronic pain of left knee  Muscle weakness (generalized)  Difficulty in walking, not elsewhere classified     Problem List Patient Active Problem List   Diagnosis Date Noted  . Psoriasis 01/15/2021  . Chronic pain of left knee 01/15/2021  . Ventral hernia without obstruction or gangrene 01/15/2021  . Class 3 severe obesity with serious comorbidity and body mass index (BMI) of 40.0 to 44.9 in adult [facility]) 01/15/2019  . Acne rosacea 05/10/2016  . Multinodular goiter 02/27/2016  . 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 Colostomy in place [facility]) s/p colostomy reversal 09/26/15 09/26/2015  . Prediabetes 07/30/2015  . Perforation of sigmoid colon ([place]) 02/20/2015  . Diverticulitis of large intestine with abscess without bleeding 02/18/2015  . OSA (obstructive sleep apnea) 05/09/2013  . GASTROENTERITIS 08/18/2010  . RAYNAUD'S SYNDROME 02/17/2009  . PLANTAR FASCIITIS 02/17/2009  . Asthma 06/12/2008  . Hyperlipidemia 09/04/2007  . DEVIATED SEPTUM 09/04/2007  . ALLERGIC RHINITIS 09/04/2007  . GERD 09/04/2007  . OSTEOARTHRITIS 09/04/2007  . HEADACHE 09/04/2007    Quintus Premo C. Daquane Aguilar PT, DPT 02/23/21 11:57 AM   [facility] [place], [place], [address] Phone: [phone]   Fax:  [phone]  Name: Meira Wahba [facility] MRN: [id number] Date of Birth: 03/29/1956

## 2021-02-25 ENCOUNTER — Telehealth: Payer: Self-pay | Admitting: Internal Medicine

## 2021-02-25 NOTE — Telephone Encounter (Signed)
Rx will be provided after visit scheduled 02/26/21

## 2021-02-25 NOTE — Telephone Encounter (Signed)
Pt called because she went to refill her Metformin and it told her there was 0 refills. Pt wanted to know why and I let her know it was because she hasn't been seen or had communication since 2020 and will need to schedule an appt before any refills.  Pt made appt for tomorrow 5/5  [place] [place]  [place] [place] [address]

## 2021-02-26 ENCOUNTER — Ambulatory Visit (HOSPITAL_BASED_OUTPATIENT_CLINIC_OR_DEPARTMENT_OTHER): Payer: BC Managed Care – PPO | Admitting: Physical Therapy

## 2021-02-26 ENCOUNTER — Encounter: Payer: Self-pay | Admitting: Internal Medicine

## 2021-02-26 ENCOUNTER — Ambulatory Visit: Payer: BC Managed Care – PPO | Admitting: Internal Medicine

## 2021-02-26 ENCOUNTER — Other Ambulatory Visit: Payer: Self-pay

## 2021-02-26 VITALS — BP 120/82 | HR 80 | Ht 59.0 in | Wt 225.6 lb

## 2021-02-26 DIAGNOSIS — R7303 Prediabetes: Secondary | ICD-10-CM | POA: Diagnosis not present

## 2021-02-26 DIAGNOSIS — E042 Nontoxic multinodular goiter: Secondary | ICD-10-CM | POA: Diagnosis not present

## 2021-02-26 DIAGNOSIS — E0590 Thyrotoxicosis, unspecified without thyrotoxic crisis or storm: Secondary | ICD-10-CM

## 2021-02-26 MED ORDER — METFORMIN HCL 500 MG PO TABS: 500.0000 mg | ORAL_TABLET | Freq: Two times a day (BID) | ORAL | 3 refills | Status: DC

## 2021-02-26 NOTE — Patient Instructions (Addendum)
Please continue: - Metformin 500 mg 2x a day  Targets: - before meals: 80-130 (for you: 80-120) - after meals: <180 (for you: <150)  Stop: - Methimazole   Please come back for labs in 5 weeks.  Please come back for a follow-up appointment in 1 year.

## 2021-02-26 NOTE — Progress Notes (Signed)
Patient ID: Alison Best [facility], female   DOB: 10-13-1956, 65 y.o.   MRN: [id number]  This visit occurred during the SARS-CoV-2 public health emergency.  Safety protocols were in place, including screening questions prior to the visit, additional usage of staff PPE, and extensive cleaning of exam room while observing appropriate contact time as indicated for disinfecting solutions.   HPI  Alison Best is a 65 y.o.-year-old female, returning for f/u for subclinical thyrotoxicosis and MNG and also prediabetes. Last visit 1.5 years ago.  She is not compliant with the recommended follow-up visits.  Interim history: She retired since last OV.  She joined a gym since  5 weeks ago - PT 2x a week (pool), yoga, more exercise. On Voltaren tablets for joint pain. She also has abdominal pain, chronic after her abdominal surgery and diverticulitis.  The pain feels better after she started to go to the gym.  Subclinical thyrotoxicosis: -Patient with mild Graves' disease.  We started methimazole in 05/2015.  She is currently on 2.5 mg daily.  Reviewed patient's TFTs: Lab Results  Component Value Date   TSH 2.08 01/27/2021   TSH 3.770 06/26/2020   TSH 2.88 10/05/2019   TSH 2.26 03/23/2019   TSH 2.44 09/20/2018   TSH 4.06 11/09/2017   TSH 10.28 (H) 09/30/2017   TSH 1.74 04/01/2017   TSH 1.42 02/09/2017   TSH 0.06 (L) 10/01/2016   FREET4 0.72 01/27/2021   FREET4 1.07 06/26/2020   FREET4 0.82 10/05/2019   FREET4 0.77 03/23/2019   FREET4 0.82 09/20/2018   FREET4 0.81 11/09/2017   FREET4 0.61 09/30/2017   FREET4 0.70 04/01/2017   FREET4 0.62 02/09/2017   FREET4 1.18 10/01/2016   Lab Results  Component Value Date   T3FREE 2.8 01/27/2021   T3FREE 3.1 06/26/2020   T3FREE 3.0 10/05/2019   T3FREE 2.9 03/23/2019   T3FREE 2.9 09/20/2018   T3FREE 3.1 11/09/2017   T3FREE 3.1 09/30/2017   T3FREE 3.1 04/01/2017   T3FREE 3.5 02/09/2017   T3FREE 4.1 10/01/2016    Lab Results   Component Value Date   TSI 111 06/18/2014   07/10/2014 Uptake and scan: The 24 hr uptake by the thyroid gland is 28.5%. Normal 24 hr uptakeis 10-30 %. On thyroid imaging, the thyroid activity appears mildly heterogeneous in the right lobe with a possible cold nodule inferiorly. The left lobe activity appears homogeneous.  Thyroid nodules:  -Diagnosed in 06/2014  Reviewedprevious ultrasound reports that showed several bilateral thyroid nodules, with a right dominant 1.5 x 1.1 x 1.3 cm nodule.  We performed another thyroid ultrasound on 10/24/2018, which showed dissolution of the previous thyroid nodules.  The thyroid gland appears heterogeneous (please see assessment section).  Pt denies: - feeling nodules in neck - hoarseness - dysphagia - choking - SOB with lying down  Prediabetes:  Reviewed HbA1c levels: Lab Results  Component Value Date   HGBA1C 6.1 01/27/2021   HGBA1C 6.1 (H) 06/26/2020   HGBA1C 6.0 10/05/2019   She continues: - Metformin 500 mg 2x a day with meals.   Kidney function was normal at home Lab Results  Component Value Date   BUN 11 01/27/2021   BUN 16 06/26/2020   CREATININE 0.71 01/27/2021   CREATININE 0.86 06/26/2020   She has a history of hypertriglyceridemia: Lab Results  Component Value Date   CHOL 302 (H) 01/27/2021   HDL 24.00 (L) 01/27/2021   LDLCALC 75 06/26/2020   LDLDIRECT 48.0 01/27/2021   TRIG (H) 01/27/2021  666.0 Triglyceride is over 400; calculations on Lipids are invalid.   CHOLHDL 13 01/27/2021  She is off Vascepa (has GERD from this). On Lipitor 20. Now on Fenofibrate 145. She is considering restarting fish oil.  She cut out alcohol before last visit.  She was previously seen in the weight management clinic and she started to lose weight but did not continue the program. She was on Topiramate >> less carbohydrate cravings.  She had colon resection in summer 2016, after an episode of diverticulitis >> she is s/p colonostomy  closure.  ROS: Constitutional: no weight gain/no weight loss, no fatigue, no subjective hyperthermia, no subjective hypothermia Eyes: no blurry vision, no xerophthalmia ENT: no sore throat, + see HPI Cardiovascular: no CP/no SOB/no palpitations/no leg swelling Respiratory: no cough/no SOB/no wheezing Gastrointestinal: no N/no V/no D/no C/previous acid reflux with Vascepa Musculoskeletal: no muscle aches/+ joint aches Skin: no rashes, no hair loss Neurological: no tremors/no numbness/no tingling/no dizziness  I reviewed pt's medications, allergies, PMH, social hx, family hx, and changes were documented in the history of present illness. Otherwise, unchanged from my initial visit note.  Past Medical History:  Diagnosis Date  . Anal fissure   . Arthritis   . Bursitis   . Constipation   . COPD (chronic obstructive pulmonary disease) ([place])   . Diabetes mellitus without complication ([place])    sees Dr. Cruzita Lederer   . Diverticulitis of colon 02/2015  . Ectopic pregnancy   . Emphysema of lung ([place])   . Frozen shoulder   . Heart murmur   . Hernia, abdominal   . Hyperlipidemia   . Hyperthyroidism   . Infertility, female   . Irregular heartbeat   . Joint pain   . Obesity   . Osteoarthritis   . Sleep apnea   . Swallowing difficulty   . Thyroid disease    hyperthyroid   Past Surgical History:  Procedure Laterality Date  . APPENDECTOMY    . CESAREAN SECTION    . COLONOSCOPY  09-10-10   per Dr. Acquanetta Sit, diverticulosis of descending colon and sigmoid, internal hemorrhoids, repeat in 10 yrs  . COLOSTOMY N/A 02/20/2015   Procedure: COLOSTOMY;  Surgeon: Jackolyn Confer, MD;  Location: [facility];  Service: General;  Laterality: N/A;  . COLOSTOMY REVISION  02/20/2015   Procedure: Sigmoid Colectomy;  Surgeon: Jackolyn Confer, MD;  Location: [facility];  Service: General;;  . CYSTOSCOPY N/A 02/20/2015   Procedure: Consuela Mimes;  Surgeon: Kathie Rhodes, MD;  [facility];  Service: Urology;   Laterality: N/A;  . DILATION AND CURETTAGE OF UTERUS     x3  . ILEO LOOP COLOSTOMY CLOSURE N/A 09/26/2015   Procedure: LAPAROSCOPIC LYSIS OF ADHESIONS, COLOSTOMY CLOSURE;  Surgeon: Jackolyn Confer, MD;  [facility];  Service: General;  Laterality: N/A;  . LAPAROTOMY N/A 02/20/2015   Procedure: Emergency EXPLORATORY and drainiage of interabdominal abcesses;  Surgeon: Jackolyn Confer, MD;  [facility];  Service: General;  Laterality: N/A;  . SALPINGOOPHORECTOMY Left 02/20/2015   Procedure: SALPINGO OOPHORECTOMY;  Surgeon: Jackolyn Confer, MD;  [facility];  Service: General;  Laterality: Left;  . TONSILLECTOMY  1980   Social History   Socioeconomic History  . Marital status: Married    Spouse name: Not on file  . Number of children: 1  . Years of education: Not on file  . Highest education level: Not on file  Occupational History  . Occupation: Equities trader at [employer]: [employer]  Tobacco Use  . Smoking status: Former Smoker    Packs/day: 0.25    Years: 40.00    Pack years: 10.00    Types: Cigarettes  . Smokeless tobacco: Never Used  . Tobacco comment: recently quit smoking, 2 mos ago  Vaping Use  . Vaping Use: Never used  Substance and Sexual Activity  . Alcohol use: Yes    Alcohol/week: 0.0 standard drinks    Comment: rare  . Drug use: No  . Sexual activity: Not Currently    Partners: Male    Comment: 1ST intercourse- 29, partners- 89,  married x 32 yrs   Other Topics Concern  . Not on file  Social History Narrative  . Not on file   Social Determinants of Health   Financial Resource Strain: Not on file  Food Insecurity: Not on file  Transportation Needs: Not on file  Physical Activity: Not on file  Stress: Not on file  Social Connections: Not on file  Intimate Partner Violence: Not on file   Current Outpatient Medications on File Prior to Visit  Medication Sig Dispense Refill  . acetaminophen (TYLENOL)  325 MG tablet Take 2 tablets (650 mg total) by mouth every 6 (six) hours as needed for moderate pain or headache.    Marland Kitchen atorvastatin (LIPITOR) 20 MG tablet TAKE 1 TABLET(20 MG) BY MOUTH DAILY 90 tablet 2  . azithromycin (ZITHROMAX Z-PAK) 250 MG tablet As directed 6 each 0  . betamethasone dipropionate 0.05 % cream Apply topically 2 (two) times daily. 45 g 2  . Coenzyme Q10 (CO Q 10 PO) Take 1 capsule by mouth daily.    . diclofenac (VOLTAREN) 75 MG EC tablet Take 1 tablet (75 mg total) by mouth 2 (two) times daily. 180 tablet 3  . diltiazem (CARDIZEM CD) 120 MG 24 hr capsule TAKE 1 CAPSULE(120 MG) BY MOUTH DAILY 90 capsule 2  . fenofibrate (TRICOR) 145 MG tablet Take 1 tablet (145 mg total) by mouth daily. 90 tablet 3  . glucose blood (CONTOUR NEXT TEST) test strip Use to check blood sugar once a day. 100 each 12  . metFORMIN (GLUCOPHAGE) 500 MG tablet Take 500 mg by mouth 2 (two) times daily.    . methimazole (TAPAZOLE) 5 MG tablet Take 2.5 mg by mouth daily.    . metroNIDAZOLE (METROGEL) 1 % gel APPLY EXTERNALLY TO THE AFFECTED AREA DAILY 60 g 11  . Microlet Lancets MISC USE TO CHECK BLOOD SUGAR ONCE DAILY 100 each 0  . Multiple Vitamins-Minerals (MULTIVITAMIN WITH MINERALS) tablet Take 1 tablet by mouth daily.     No current facility-administered medications on file prior to visit.   Allergies  Allergen Reactions  . Levofloxacin In D5w Other (See Comments)    Achilles tendon weakness  . Mangifera Indica Anaphylaxis  . Mango Flavor Swelling  . Bupropion Nausea And Vomiting  . Levaquin [Levofloxacin] Other (See Comments)    Severe tendonitis Tolerates Cipro   Family History  Problem Relation Age of Onset  . Breast cancer Mother   . High blood pressure Mother   . Kidney disease Mother   . Sleep apnea Mother   . Obesity Mother   . CAD Father        CABG  . Skin cancer Father   . Lung cancer Father   . Diverticulitis Father   . Colon polyps Father   . High blood pressure Father    . Stroke Father   . Heart disease Father   .  Sleep apnea Father   . COPD Other   . Cancer Other   . Heart disease Other   . Stroke Other   . Allergies Brother   . Diabetes Brother   . Breast cancer Maternal Grandmother   . Heart attack Maternal Grandfather   . Heart attack Paternal Grandfather   . Hypertension Neg Hx    PE: BP 120/82 (BP Location: Right Arm, Patient Position: Sitting, Cuff Size: Normal)   Pulse 80   Ht 4\' 11"  (1.499 m)   Wt 225 lb 9.6 oz (102.3 kg)   LMP 03/22/2012   SpO2 97%   BMI 45.57 kg/m   Wt Readings from Last 3 Encounters:  02/26/21 225 lb 9.6 oz (102.3 kg)  01/27/21 221 lb 6.4 oz (100.4 kg)  01/15/21 223 lb (101.2 kg)   Constitutional: overweight, in NAD, walks with a sit down walker Eyes: PERRLA, EOMI, no exophthalmos ENT: moist mucous membranes, no thyromegaly, no cervical lymphadenopathy Cardiovascular: RRR, No MRG Respiratory: CTA B Gastrointestinal: abdomen soft, NT, ND, BS+ Musculoskeletal: no deformities, strength intact in all 4 Skin: moist, warm, no rashes Neurological: no tremor with outstretched hands, DTR normal in all 4  ASSESSMENT: 1. Thyrotoxycosis, likely mild Graves' disease  07/10/2014: Uptake and scan: 24 hr uptake by the thyroid gland is 28.5%. Normal 24 hr uptake is 10-30 %. On thyroid imaging, the thyroid activity appears mildly heterogeneous in the right lobe with a possible cold nodule inferiorly. The left lobe activity appears homogeneous.  2. Multinodular goiter  07/15/2014: Thyroid U/S:  Right thyroid lobe: 4.5 x 1.9 x 1.9 cm. Multiple nodules are noted throughout the right lobe of the thyroid. There is diffuse irregular margined hypoechoic nodule measuring 16 mm in the upper pole. Multiple smaller nodules are identified.  Left thyroid lobe: 4.4 x 1.7 x 1.9 cm. Multiple hypoechoic nodules are noted within the left lobe of the thyroid. The largest of these measures 15 mm in greatest dimension lying within the  lower pole  Isthmus Thickness: 0.5 cm. No nodules visualized.  Lymphadenopathy None visualized.  IMPRESSION: Multiple bilateral thyroid nodules. The dominant nodules bilateral meet criteria for percutaneous biopsy.   10/20/2017: Thyroid U/S: Location: Right; Inferior Maximum size: 1.5 cm; Other 2 dimensions: 1.1 cm x 1.3 cm Composition: solid/almost completely solid (2) Echogenicity: isoechoic (1)  IMPRESSION: Right inferior thyroid nodule (labeled 1) meets criteria for surveillance, as designated by the newly established ACR TI-RADS criteria. Surveillance ultrasound study recommended to be performed annually up to 5 years. Given that the prior ultrasound was performed 2015, this may serve as the third annual.  10/24/2018: Thyroid U/S: Parenchymal Echotexture: Moderately heterogenous Isthmus: 0.7 cm Right lobe: 5.2 x 2.2 x 2.3 cm Left lobe: 5.5 x 2.5 x 2.0 cm  No discrete nodules are seen within the thyroid gland. Specifically, the 1.5 cm nodule previously seen in the right inferior gland is no longer discernible from the background thyroid parenchyma. It very likely represented a pseudo nodule.  IMPRESSION: 1. The previously identified right inferior thyroid nodule can not be identified on today's examination. It is either involuted over the past year, or was representative of a pseudo nodule on the prior examination. No further follow-up required. 2. Similar appearance of enlarged and diffusely heterogeneous thyroid gland.  3. Prediabetes   PLAN:  1. Patient with history of subclinical hypothyroidism, initially with few thyrotoxic symptoms: Heat intolerance, palpitations.  She did have atrial fibrillation in the past and she is on Cardizem, changed from metoprolol  due to dizziness -A thyroid uptake and scan showed mild Graves' disease versus mildly thyrotoxic nodules.  Of note, her TSI's were not elevated in the past.  We started methimazole in 05/2015 and she continues  on 2.5 mg daily.  In the past, she was changing the methimazole dose (increased to 5 mg daily) whenever she had neck compression symptoms: Neck fullness and dysphagia.  I advised her against doing so without having thyroid function tests checked. She did not change the dose since last visit -At this visit, she does not describe thyrotoxic symptoms -Her heart rate is normal -We reviewed together her recent TFTs from a month ago and they were normal -I suggested to try to stop methimazole and return in 5 weeks for labs. -I will have her come back in 1 year   2. Multinodular goiter -I reviewed the report of her latest thyroid ultrasound from 10/24/2018: Her thyroid nodules are not visible on the new ultrasound, consistent with previous pseudonodules (size of inflammation), which resolved -No neck compression symptoms -No further follow-up is necessary for this  3. Prediabetes -At our last visit HbA1c was 6.2%, but she had another HbA1c of 6.1% last month -5 weeks ago she started to exercise consistently at the gym and she feels great.  I strongly encouraged her to continue. -She continues on metformin 500 mg 2x a day, which she tolerates well -She is up-to-date with eye exams -Will recheck this when she comes back in a year  Patient Instructions  Please continue: - Metformin 500 mg 2x a day  Targets: - before meals: 80-130 (for you: 80-120) - after meals: <180 (for you: <150)  Stop: - Methimazole   Please come back for labs in 5 weeks.  Please come back for a follow-up appointment in 1 year.   Philemon Kingdom, MD PhD [facility] Endocrinology

## 2021-03-02 ENCOUNTER — Encounter (HOSPITAL_BASED_OUTPATIENT_CLINIC_OR_DEPARTMENT_OTHER): Payer: Self-pay | Admitting: Physical Therapy

## 2021-03-02 ENCOUNTER — Ambulatory Visit (HOSPITAL_BASED_OUTPATIENT_CLINIC_OR_DEPARTMENT_OTHER): Payer: BC Managed Care – PPO | Admitting: Physical Therapy

## 2021-03-02 ENCOUNTER — Other Ambulatory Visit: Payer: Self-pay

## 2021-03-02 DIAGNOSIS — M6281 Muscle weakness (generalized): Secondary | ICD-10-CM | POA: Diagnosis not present

## 2021-03-02 DIAGNOSIS — G8929 Other chronic pain: Secondary | ICD-10-CM

## 2021-03-02 DIAGNOSIS — M25562 Pain in left knee: Secondary | ICD-10-CM | POA: Diagnosis not present

## 2021-03-02 DIAGNOSIS — R262 Difficulty in walking, not elsewhere classified: Secondary | ICD-10-CM

## 2021-03-02 NOTE — Therapy (Signed)
[place], [place], [address] Phone: [phone]   Fax:  [phone]  Physical Therapy Treatment  Patient Details  Name: Alison [facility] MRN: [id number] Date of Birth: 21-Mar-1956 Referring Provider (PT): Laurey Morale, MD   Encounter Date: 03/02/2021   PT End of Session - 03/02/21 1024    Visit Number 9    Number of Visits 17    Date for PT Re-Evaluation 03/27/21    Authorization Type BCBS 30 VL    PT Start Time 1022   no charge for time to get dressed   PT Stop Time 1101    PT Time Calculation (min) 39 min    Activity Tolerance Patient tolerated treatment well    Behavior During Therapy [facility] for tasks assessed/performed           Past Medical History:  Diagnosis Date  . Anal fissure   . Arthritis   . Bursitis   . Constipation   . COPD (chronic obstructive pulmonary disease) ([place])   . Diabetes mellitus without complication ([place])    sees Dr. Cruzita Lederer   . Diverticulitis of colon 02/2015  . Ectopic pregnancy   . Emphysema of lung ([place])   . Frozen shoulder   . Heart murmur   . Hernia, abdominal   . Hyperlipidemia   . Hyperthyroidism   . Infertility, female   . Irregular heartbeat   . Joint pain   . Obesity   . Osteoarthritis   . Sleep apnea   . Swallowing difficulty   . Thyroid disease    hyperthyroid    Past Surgical History:  Procedure Laterality Date  . APPENDECTOMY    . CESAREAN SECTION    . COLONOSCOPY  09-10-10   per Dr. Acquanetta Sit, diverticulosis of descending colon and sigmoid, internal hemorrhoids, repeat in 10 yrs  . COLOSTOMY N/A 02/20/2015   Procedure: COLOSTOMY;  Surgeon: Jackolyn Confer, MD;  [facility];  Service: General;  Laterality: N/A;  . COLOSTOMY REVISION  02/20/2015   Procedure: Sigmoid Colectomy;  Surgeon: Jackolyn Confer, MD;  [facility];  Service: General;;  . CYSTOSCOPY N/A 02/20/2015   Procedure: Consuela Mimes;  Surgeon: Kathie Rhodes, MD;   [facility];  Service: Urology;  Laterality: N/A;  . DILATION AND CURETTAGE OF UTERUS     x3  . ILEO LOOP COLOSTOMY CLOSURE N/A 09/26/2015   Procedure: LAPAROSCOPIC LYSIS OF ADHESIONS, COLOSTOMY CLOSURE;  Surgeon: Jackolyn Confer, MD;  [facility];  Service: General;  Laterality: N/A;  . LAPAROTOMY N/A 02/20/2015   Procedure: Emergency EXPLORATORY and drainiage of interabdominal abcesses;  Surgeon: Jackolyn Confer, MD;  [facility];  Service: General;  Laterality: N/A;  . SALPINGOOPHORECTOMY Left 02/20/2015   Procedure: SALPINGO OOPHORECTOMY;  Surgeon: Jackolyn Confer, MD;  [facility];  Service: General;  Laterality: Left;  . TONSILLECTOMY  1980    There were no vitals filed for this visit.   Subjective Assessment - 03/02/21 1024    Subjective Less groin pain, I have been doing abduction motions which is helpful. kept a low profile recently due to [place] scare.    Currently in Pain? No/denies                             [facility] Adult PT Treatment/Exercise - 03/02/21 0001      Exercises   Other Exercises  see PT note for aquatic exercises  warm up laps: fwd, back, lateral Sumo squats & squat walks Standing Lt hip ext bil LE circles with knee ext Heel raises Marching steps with arms out for drag Water push with balance: horiz abd/add, flx/ext, scissoring  Step ups bottom step- two hand rails, alt feet          PT Short Term Goals - 02/13/21 1151      PT SHORT TERM GOAL #1   Title pt will be independent with HEP as it has been established in the short term.    Status Achieved      PT SHORT TERM GOAL #2   Title pt will verbalize feeling some improvement in overall mobility through ADLs.    Status Achieved             PT Long Term Goals - 02/03/21 1542      PT LONG TERM GOAL #1   Title pt will be able to navigate stairs at her home with confidence and necessary strength for reciprocal pattern    Baseline 5 steps  without hand rails    Time 8    Period Weeks    Status New    Target Date 03/27/21      PT LONG TERM GOAL #2   Title 5TSTS to improve by MDC of 4s    Baseline 23s at eval, community dwelling norm for age group is 11.4    Time 8    Period Weeks    Status New    Target Date 03/27/21      PT LONG TERM GOAL #3   Title BERG to improve by MDC of 3 points    Baseline see flowsheet    Time 8    Period Weeks    Status New    Target Date 03/27/21      PT LONG TERM GOAL #4   Title 6MWT to improve by MDC of 191 ft    Baseline age appropriate would be 1765    Time 8    Period Weeks    Status New    Target Date 03/27/21                 Plan - 03/02/21 1447    Clinical Impression Statement Entered water via steps using bil HR. Ex in depths 3'6"-4', water temp 88-90 deg. Water buoyancy decr pressure from gravitational pull for appropriate strengthening and safety in balance exercises. Notable increase in tolerance for exercises insensity and endurance. is planning to go to picnic tomorrow at [place] without using rollator.    PT Treatment/Interventions ADLs/Self Care Home Management;Aquatic Therapy;Cryotherapy;Electrical Stimulation;Moist Heat;Balance training;Therapeutic exercise;Therapeutic activities;Functional mobility training;Stair training;Gait training;Neuromuscular re-education;Patient/family education;Manual techniques;Taping;Dry needling;Passive range of motion    PT Next Visit Plan aquatic visit and then land re-eval    PT Home Exercise Plan [facility]    Consulted and Agree with Plan of Care Patient           Patient will benefit from skilled therapeutic intervention in order to improve the following deficits and impairments:  Decreased strength,Postural dysfunction,Difficulty walking,Decreased activity tolerance,Decreased mobility,Decreased balance,Pain,Decreased endurance  Visit Diagnosis: Chronic pain of left knee  Muscle weakness (generalized)  Difficulty  in walking, not elsewhere classified     Problem List Patient Active Problem List   Diagnosis Date Noted  . Psoriasis 01/15/2021  . Chronic pain of left knee 01/15/2021  . Ventral hernia without obstruction or gangrene 01/15/2021  . Class 3 severe obesity with serious comorbidity and  body mass index (BMI) of 40.0 to 44.9 in adult [facility]) 01/15/2019  . Acne rosacea 05/10/2016  . Multinodular goiter 02/27/2016  . 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 Colostomy in place [facility]) s/p colostomy reversal 09/26/15 09/26/2015  . Prediabetes 07/30/2015  . Perforation of sigmoid colon ([place]) 02/20/2015  . Diverticulitis of large intestine with abscess without bleeding 02/18/2015  . OSA (obstructive sleep apnea) 05/09/2013  . GASTROENTERITIS 08/18/2010  . RAYNAUD'S SYNDROME 02/17/2009  . PLANTAR FASCIITIS 02/17/2009  . Asthma 06/12/2008  . Hyperlipidemia 09/04/2007  . DEVIATED SEPTUM 09/04/2007  . ALLERGIC RHINITIS 09/04/2007  . GERD 09/04/2007  . OSTEOARTHRITIS 09/04/2007  . HEADACHE 09/04/2007    Priscila Bean C. Yaasir Menken PT, DPT 03/02/21 2:51 PM   [facility] [address] [place], [place], [address] Phone: [phone]   Fax:  [phone]  Name: Alison Best [facility] MRN: [id number] Date of Birth: 1956-03-30

## 2021-03-06 ENCOUNTER — Encounter (HOSPITAL_BASED_OUTPATIENT_CLINIC_OR_DEPARTMENT_OTHER): Payer: Self-pay | Admitting: Physical Therapy

## 2021-03-09 ENCOUNTER — Encounter (HOSPITAL_BASED_OUTPATIENT_CLINIC_OR_DEPARTMENT_OTHER): Payer: Self-pay | Admitting: Physical Therapy

## 2021-03-10 ENCOUNTER — Other Ambulatory Visit: Payer: Self-pay

## 2021-03-10 ENCOUNTER — Ambulatory Visit (HOSPITAL_BASED_OUTPATIENT_CLINIC_OR_DEPARTMENT_OTHER): Payer: BC Managed Care – PPO | Admitting: Physical Therapy

## 2021-03-10 DIAGNOSIS — R262 Difficulty in walking, not elsewhere classified: Secondary | ICD-10-CM | POA: Diagnosis not present

## 2021-03-10 DIAGNOSIS — G8929 Other chronic pain: Secondary | ICD-10-CM | POA: Diagnosis not present

## 2021-03-10 DIAGNOSIS — M25562 Pain in left knee: Secondary | ICD-10-CM

## 2021-03-10 DIAGNOSIS — M6281 Muscle weakness (generalized): Secondary | ICD-10-CM | POA: Diagnosis not present

## 2021-03-10 NOTE — Therapy (Signed)
[place], [place], [address] Phone: [phone]   Fax:  [phone]  Physical Therapy Treatment  Patient Details  Name: Alison Best [facility] MRN: [id number] Date of Birth: [birth year] Referring Provider (PT): Laurey Morale, MD   Encounter Date: 03/10/2021   PT End of Session - 03/10/21 1302    Visit Number 10    Number of Visits 17    Date for PT Re-Evaluation 03/27/21    Authorization Type BCBS 30 VL    PT Start Time 1300    PT Stop Time 1345    PT Time Calculation (min) 45 min    Activity Tolerance Patient tolerated treatment well    Behavior During Therapy [facility] for tasks assessed/performed           Past Medical History:  Diagnosis Date  . Anal fissure   . Arthritis   . Bursitis   . Constipation   . COPD (chronic obstructive pulmonary disease) ([place])   . Diabetes mellitus without complication ([place])    sees Dr. Cruzita Lederer   . Diverticulitis of colon 02/2015  . Ectopic pregnancy   . Emphysema of lung ([place])   . Frozen shoulder   . Heart murmur   . Hernia, abdominal   . Hyperlipidemia   . Hyperthyroidism   . Infertility, female   . Irregular heartbeat   . Joint pain   . Obesity   . Osteoarthritis   . Sleep apnea   . Swallowing difficulty   . Thyroid disease    hyperthyroid    Past Surgical History:  Procedure Laterality Date  . APPENDECTOMY    . CESAREAN SECTION    . COLONOSCOPY  09-10-10   per Dr. Acquanetta Sit, diverticulosis of descending colon and sigmoid, internal hemorrhoids, repeat in 10 yrs  . COLOSTOMY N/A 02/20/2015   Procedure: COLOSTOMY;  Surgeon: Jackolyn Confer, MD;  Location: [facility];  Service: General;  Laterality: N/A;  . COLOSTOMY REVISION  02/20/2015   Procedure: Sigmoid Colectomy;  Surgeon: Jackolyn Confer, MD;  [facility];  Service: General;;  . CYSTOSCOPY N/A 02/20/2015   Procedure: Consuela Mimes;  Surgeon: Kathie Rhodes, MD;  [facility];  Service:  Urology;  Laterality: N/A;  . DILATION AND CURETTAGE OF UTERUS     x3  . ILEO LOOP COLOSTOMY CLOSURE N/A 09/26/2015   Procedure: LAPAROSCOPIC LYSIS OF ADHESIONS, COLOSTOMY CLOSURE;  Surgeon: Jackolyn Confer, MD;  [facility];  Service: General;  Laterality: N/A;  . LAPAROTOMY N/A 02/20/2015   Procedure: Emergency EXPLORATORY and drainiage of interabdominal abcesses;  Surgeon: Jackolyn Confer, MD;  [facility];  Service: General;  Laterality: N/A;  . SALPINGOOPHORECTOMY Left 02/20/2015   Procedure: SALPINGO OOPHORECTOMY;  Surgeon: Jackolyn Confer, MD;  [facility];  Service: General;  Laterality: Left;  . TONSILLECTOMY  1980    There were no vitals filed for this visit.   Subjective Assessment - 03/10/21 1305    Subjective I can abducts a lot more. still doing chair yoga, felt my arms after last session. tried poses in chair yoga where I had to put one leg in chair, had a hard time standing on Lt leg.               aquatic exercises: Large noodle single leg press down Tandem balance noodle on floor Lateral step ups at 4 ft x30 ea Straight arm press downs with nekdoodle sit to stand from bench in pool Lt fwd step up  bottom step Float up adductor stretch float up HS stretch Float up hip flexor stretch gastroc stretch                     PT Short Term Goals - 02/13/21 1151      PT SHORT TERM GOAL #1   Title pt will be independent with HEP as it has been established in the short term.    Status Achieved      PT SHORT TERM GOAL #2   Title pt will verbalize feeling some improvement in overall mobility through ADLs.    Status Achieved             PT Long Term Goals - 02/03/21 1542      PT LONG TERM GOAL #1   Title pt will be able to navigate stairs at her home with confidence and necessary strength for reciprocal pattern    Baseline 5 steps without hand rails    Time 8    Period Weeks    Status New    Target Date 03/27/21      PT  LONG TERM GOAL #2   Title 5TSTS to improve by MDC of 4s    Baseline 23s at eval, community dwelling norm for age group is 11.4    Time 8    Period Weeks    Status New    Target Date 03/27/21      PT LONG TERM GOAL #3   Title BERG to improve by MDC of 3 points    Baseline see flowsheet    Time 8    Period Weeks    Status New    Target Date 03/27/21      PT LONG TERM GOAL #4   Title 6MWT to improve by MDC of 191 ft    Baseline age appropriate would be 1765    Time 8    Period Weeks    Status New    Target Date 03/27/21                 Plan - 03/10/21 1523    Clinical Impression Statement Entered water via steps using bil HR. Ex in depths 3'6"-4', water temp 88-90 deg. Water buoyancy decr pressure from gravitational pull for appropriate strengthening and safety in balance exercises. began feeling a slight catching in SIJ today and discussed returning to Lt hip extensor activation when that does get irritated. tolerating more exercise and in more shallow waters than prior. reminders necessary to utilize core activation for lumbar support.    PT Treatment/Interventions ADLs/Self Care Home Management;Aquatic Therapy;Cryotherapy;Electrical Stimulation;Moist Heat;Balance training;Therapeutic exercise;Therapeutic activities;Functional mobility training;Stair training;Gait training;Neuromuscular re-education;Patient/family education;Manual techniques;Taping;Dry needling;Passive range of motion    PT Next Visit Plan land re-eval    PT Home Exercise Plan [facility]    Consulted and Agree with Plan of Care Patient           Patient will benefit from skilled therapeutic intervention in order to improve the following deficits and impairments:  Decreased strength,Postural dysfunction,Difficulty walking,Decreased activity tolerance,Decreased mobility,Decreased balance,Pain,Decreased endurance  Visit Diagnosis: Chronic pain of left knee  Muscle weakness (generalized)  Difficulty in  walking, not elsewhere classified     Problem List Patient Active Problem List   Diagnosis Date Noted  . Psoriasis 01/15/2021  . Chronic pain of left knee 01/15/2021  . Ventral hernia without obstruction or gangrene 01/15/2021  . Class 3 severe obesity with serious comorbidity and body mass index (BMI) of 40.0  to 44.9 in adult [facility]) 01/15/2019  . Acne rosacea 05/10/2016  . Multinodular goiter 02/27/2016  . 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 Colostomy in place [facility]) s/p colostomy reversal 09/26/15 09/26/2015  . Prediabetes 07/30/2015  . Perforation of sigmoid colon ([place]) 02/20/2015  . Diverticulitis of large intestine with abscess without bleeding 02/18/2015  . OSA (obstructive sleep apnea) 05/09/2013  . GASTROENTERITIS 08/18/2010  . RAYNAUD'S SYNDROME 02/17/2009  . PLANTAR FASCIITIS 02/17/2009  . Asthma 06/12/2008  . Hyperlipidemia 09/04/2007  . DEVIATED SEPTUM 09/04/2007  . ALLERGIC RHINITIS 09/04/2007  . GERD 09/04/2007  . OSTEOARTHRITIS 09/04/2007  . HEADACHE 09/04/2007   Jaquesha Boroff C. Mir Fullilove PT, DPT 03/10/21 3:27 PM   [facility] [address] [place], [place], [address] Phone: [phone]   Fax:  [phone]  Name: Graycee [facility] MRN: [id number] Date of Birth: 05/06/1956

## 2021-03-11 ENCOUNTER — Ambulatory Visit (INDEPENDENT_AMBULATORY_CARE_PROVIDER_SITE_OTHER): Payer: BC Managed Care – PPO | Admitting: Professional

## 2021-03-11 DIAGNOSIS — F4321 Adjustment disorder with depressed mood: Secondary | ICD-10-CM | POA: Diagnosis not present

## 2021-03-12 ENCOUNTER — Ambulatory Visit (HOSPITAL_BASED_OUTPATIENT_CLINIC_OR_DEPARTMENT_OTHER): Payer: BC Managed Care – PPO | Admitting: Physical Therapy

## 2021-03-16 ENCOUNTER — Encounter (HOSPITAL_BASED_OUTPATIENT_CLINIC_OR_DEPARTMENT_OTHER): Payer: Self-pay | Admitting: Physical Therapy

## 2021-03-17 ENCOUNTER — Encounter (HOSPITAL_BASED_OUTPATIENT_CLINIC_OR_DEPARTMENT_OTHER): Payer: Self-pay | Admitting: Physical Therapy

## 2021-03-17 ENCOUNTER — Ambulatory Visit (HOSPITAL_BASED_OUTPATIENT_CLINIC_OR_DEPARTMENT_OTHER): Payer: BC Managed Care – PPO | Admitting: Physical Therapy

## 2021-03-17 ENCOUNTER — Other Ambulatory Visit: Payer: Self-pay

## 2021-03-17 DIAGNOSIS — G8929 Other chronic pain: Secondary | ICD-10-CM

## 2021-03-17 DIAGNOSIS — R262 Difficulty in walking, not elsewhere classified: Secondary | ICD-10-CM | POA: Diagnosis not present

## 2021-03-17 DIAGNOSIS — M6281 Muscle weakness (generalized): Secondary | ICD-10-CM

## 2021-03-17 DIAGNOSIS — M25562 Pain in left knee: Secondary | ICD-10-CM

## 2021-03-17 NOTE — Therapy (Signed)
[place], [place], [address] Phone: [phone]   Fax:  [phone]  Physical Therapy Treatment/Re-eval  Patient Details  Name: Alison Best [facility] MRN: [id number] Date of Birth: [birth year] Referring Provider (PT): Laurey Morale, MD   Encounter Date: 03/17/2021   PT End of Session - 03/17/21 1342    Visit Number 11    Number of Visits 27    Date for PT Re-Evaluation 05/15/21    Authorization Type BCBS 30 VL    PT Start Time 1305    PT Stop Time 1348    PT Time Calculation (min) 43 min    Activity Tolerance Patient tolerated treatment well    Behavior During Therapy [facility] for tasks assessed/performed           Past Medical History:  Diagnosis Date  . Anal fissure   . Arthritis   . Bursitis   . Constipation   . COPD (chronic obstructive pulmonary disease) ([place])   . Diabetes mellitus without complication ([place])    sees Dr. Cruzita Lederer   . Diverticulitis of colon 02/2015  . Ectopic pregnancy   . Emphysema of lung ([place])   . Frozen shoulder   . Heart murmur   . Hernia, abdominal   . Hyperlipidemia   . Hyperthyroidism   . Infertility, female   . Irregular heartbeat   . Joint pain   . Obesity   . Osteoarthritis   . Sleep apnea   . Swallowing difficulty   . Thyroid disease    hyperthyroid    Past Surgical History:  Procedure Laterality Date  . APPENDECTOMY    . CESAREAN SECTION    . COLONOSCOPY  09-10-10   per Dr. Acquanetta Sit, diverticulosis of descending colon and sigmoid, internal hemorrhoids, repeat in 10 yrs  . COLOSTOMY N/A 02/20/2015   Procedure: COLOSTOMY;  Surgeon: Jackolyn Confer, MD;  [facility];  Service: General;  Laterality: N/A;  . COLOSTOMY REVISION  02/20/2015   Procedure: Sigmoid Colectomy;  Surgeon: Jackolyn Confer, MD;  [facility];  Service: General;;  . CYSTOSCOPY N/A 02/20/2015   Procedure: Consuela Mimes;  Surgeon: Kathie Rhodes, MD;  [facility];   Service: Urology;  Laterality: N/A;  . DILATION AND CURETTAGE OF UTERUS     x3  . ILEO LOOP COLOSTOMY CLOSURE N/A 09/26/2015   Procedure: LAPAROSCOPIC LYSIS OF ADHESIONS, COLOSTOMY CLOSURE;  Surgeon: Jackolyn Confer, MD;  [facility];  Service: General;  Laterality: N/A;  . LAPAROTOMY N/A 02/20/2015   Procedure: Emergency EXPLORATORY and drainiage of interabdominal abcesses;  Surgeon: Jackolyn Confer, MD;  [facility];  Service: General;  Laterality: N/A;  . SALPINGOOPHORECTOMY Left 02/20/2015   Procedure: SALPINGO OOPHORECTOMY;  Surgeon: Jackolyn Confer, MD;  [facility];  Service: General;  Laterality: Left;  . TONSILLECTOMY  1980    There were no vitals filed for this visit.   Subjective Assessment - 03/17/21 1306    Subjective I feel like I can do things I was unable to do before starting. I notice I have less pain follwoing stretching, yoga and aquatics. Not wearing the brace I was before, now just using a spanx compression.    How long can you walk comfortably? car to PT clinic is no longer an issue; was able to walk museum with rollator, can do an hour of walking around with little rest breaks in rollator    Patient Stated Goals Sept 1st going on a trip to [place]  rockies.get moving again & feeling better. improve balance, stairs    Currently in Pain? No/denies              [facility] PT Assessment - 03/17/21 0001      Assessment   Medical Diagnosis Lt knee pain    Referring Provider (PT) Laurey Morale, MD      Special Tests   Other special tests 5TSTS 13s      6 minute walk test results    Endurance additional comments 805   began limping about 1/2 way     Berg Balance Test   Sit to Stand Able to stand without using hands and stabilize independently    Standing Unsupported Able to stand safely 2 minutes    Sitting with Back Unsupported but Feet Supported on Floor or Stool Able to sit safely and securely 2 minutes    Stand to Sit Sits safely with minimal use  of hands    Transfers Able to transfer safely, minor use of hands    Standing Unsupported with Eyes Closed Able to stand 10 seconds safely    Standing Unsupported with Feet Together Able to place feet together independently and stand 1 minute safely    From Standing, Reach Forward with Outstretched Arm Can reach forward >12 cm safely (5")    From Standing Position, Pick up Object from Floor Able to pick up shoe safely and easily    From Standing Position, Turn to Look Behind Over each Shoulder Looks behind from both sides and weight shifts well    Turn 360 Degrees Able to turn 360 degrees safely but slowly    Standing Unsupported, Alternately Place Feet on Step/Stool Able to stand independently and complete 8 steps >20 seconds    Standing Unsupported, One Foot in Front Able to plae foot ahead of the other independently and hold 30 seconds    Standing on One Leg Tries to lift leg/unable to hold 3 seconds but remains standing independently    Total Score 48                                 PT Education - 03/17/21 1438    Education Details goals, tests outcomes, POC    Person(s) Educated Patient    Methods Explanation    Comprehension Verbalized understanding;Need further instruction            PT Short Term Goals - 02/13/21 1151      PT SHORT TERM GOAL #1   Title pt will be independent with HEP as it has been established in the short term.    Status Achieved      PT SHORT TERM GOAL #2   Title pt will verbalize feeling some improvement in overall mobility through ADLs.    Status Achieved             PT Long Term Goals - 03/17/21 1314      PT LONG TERM GOAL #1   Title pt will be able to navigate stairs at her home with confidence and necessary strength for reciprocal pattern    Baseline alternating on 3 steps on front, main entrance is 5-6 steps and does a step-to pattern      PT LONG TERM GOAL #2   Title 5TSTS to improve by MDC of 4s    Baseline 13s  today, community dwelling norm for age group is 76.4  Status Partially Met      PT LONG TERM GOAL #3   Title BERG to improve by MDC of 3 points    Baseline improved by 6 points, 48/56, will benefit from further balance improvement to reach functional level desired    Status Partially Met      PT LONG TERM GOAL #4   Title 6MWT to improve by [place] of 191 ft    Baseline 1 ft, age appropriate 1765 and will continue to work toward this    Status Partially Met                 Plan - 03/17/21 1439    Clinical Impression Statement Pt has made significant progress in objective goals today and reports feeling that her function is improving overall. She is planning a trip in September that she would like to be able to walk for- discussed use of rollator while on trip. Pt will benefit from continued skilled PT in order to progress toward age-appropriate normative values outined in note. Audible popping in Lt hip in [facility] activity and is fearful of its ability to support her. I would like to see an xray of the hip joint to be sure that it is sound. I would also recommend imaging of the soft tissue in the lower Lt quadrant of her abdomen to determine if it will be removable or treatable to decrease uneven weight distribution that is impairing walking and hip function.    PT Treatment/Interventions ADLs/Self Care Home Management;Aquatic Therapy;Cryotherapy;Electrical Stimulation;Moist Heat;Balance training;Therapeutic exercise;Therapeutic activities;Functional mobility training;Stair training;Gait training;Neuromuscular re-education;Patient/family education;Manual techniques;Taping;Dry needling;Passive range of motion    PT Next Visit Plan aquatics until 7/22- endurance, gross hip strength    PT Home Exercise Plan [facility]    Consulted and Agree with Plan of Care Patient           Patient will benefit from skilled therapeutic intervention in order to improve the following deficits and impairments:   Decreased strength,Postural dysfunction,Difficulty walking,Decreased activity tolerance,Decreased mobility,Decreased balance,Pain,Decreased endurance  Visit Diagnosis: Chronic pain of left knee - Plan: PT plan of care cert/re-cert  Muscle weakness (generalized) - Plan: PT plan of care cert/re-cert  Difficulty in walking, not elsewhere classified - Plan: PT plan of care cert/re-cert     Problem List Patient Active Problem List   Diagnosis Date Noted  . Psoriasis 01/15/2021  . Chronic pain of left knee 01/15/2021  . Ventral hernia without obstruction or gangrene 01/15/2021  . Class 3 severe obesity with serious comorbidity and body mass index (BMI) of 40.0 to 44.9 in adult [facility]) 01/15/2019  . Acne rosacea 05/10/2016  . Multinodular goiter 02/27/2016  . 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 Colostomy in place [facility]) s/p colostomy reversal 09/26/15 09/26/2015  . Prediabetes 07/30/2015  . Perforation of sigmoid colon ([place]) 02/20/2015  . Diverticulitis of large intestine with abscess without bleeding 02/18/2015  . OSA (obstructive sleep apnea) 05/09/2013  . GASTROENTERITIS 08/18/2010  . RAYNAUD'S SYNDROME 02/17/2009  . PLANTAR FASCIITIS 02/17/2009  . Asthma 06/12/2008  . Hyperlipidemia 09/04/2007  . DEVIATED SEPTUM 09/04/2007  . ALLERGIC RHINITIS 09/04/2007  . GERD 09/04/2007  . OSTEOARTHRITIS 09/04/2007  . HEADACHE 09/04/2007    Harry Shuck C. Myosha Cuadras PT, DPT 03/17/21 2:48 PM   [facility] [address] [place], [place], [address] Phone: [phone]   Fax:  [phone]  Name: Alison Best [facility] MRN: [id number] Date of Birth: 1956-01-25

## 2021-03-19 ENCOUNTER — Ambulatory Visit (HOSPITAL_BASED_OUTPATIENT_CLINIC_OR_DEPARTMENT_OTHER): Payer: BC Managed Care – PPO | Admitting: Physical Therapy

## 2021-03-20 ENCOUNTER — Other Ambulatory Visit: Payer: Self-pay | Admitting: Cardiovascular Disease

## 2021-03-24 ENCOUNTER — Ambulatory Visit (HOSPITAL_BASED_OUTPATIENT_CLINIC_OR_DEPARTMENT_OTHER): Payer: BC Managed Care – PPO | Admitting: Physical Therapy

## 2021-03-24 ENCOUNTER — Other Ambulatory Visit: Payer: Self-pay

## 2021-03-24 ENCOUNTER — Encounter (HOSPITAL_BASED_OUTPATIENT_CLINIC_OR_DEPARTMENT_OTHER): Payer: Self-pay | Admitting: Physical Therapy

## 2021-03-24 DIAGNOSIS — M25562 Pain in left knee: Secondary | ICD-10-CM

## 2021-03-24 DIAGNOSIS — M6281 Muscle weakness (generalized): Secondary | ICD-10-CM | POA: Diagnosis not present

## 2021-03-24 DIAGNOSIS — R262 Difficulty in walking, not elsewhere classified: Secondary | ICD-10-CM | POA: Diagnosis not present

## 2021-03-24 DIAGNOSIS — G8929 Other chronic pain: Secondary | ICD-10-CM

## 2021-03-24 NOTE — Therapy (Signed)
[facility] [address] [place], [place], [address] Phone: [phone]   Fax:  [phone]  Physical Therapy Treatment  Patient Details  Name: Alison Best [facility] MRN: [id number] Date of Birth: 05-19-1956 Referring Provider (PT): Laurey Morale, MD   Encounter Date: 03/24/2021   PT End of Session - 03/24/21 1719    Visit Number 12    Number of Visits 27    Date for PT Re-Evaluation 05/15/21    Authorization Type BCBS 30 VL    PT Start Time 1941    PT Stop Time 1628    PT Time Calculation (min) 47 min    Equipment Utilized During Treatment Other (comment)   noodles/sqoodles, cuff buoys, nekdoodle, barbells   Activity Tolerance Patient tolerated treatment well;Patient limited by pain    Behavior During Therapy [facility] for tasks assessed/performed           Past Medical History:  Diagnosis Date  . Anal fissure   . Arthritis   . Bursitis   . Constipation   . COPD (chronic obstructive pulmonary disease) ([name])   . Diabetes mellitus without complication ([place])    sees Dr. Cruzita Lederer   . Diverticulitis of colon 02/2015  . Ectopic pregnancy   . Emphysema of lung ([place])   . Frozen shoulder   . Heart murmur   . Hernia, abdominal   . Hyperlipidemia   . Hyperthyroidism   . Infertility, female   . Irregular heartbeat   . Joint pain   . Obesity   . Osteoarthritis   . Sleep apnea   . Swallowing difficulty   . Thyroid disease    hyperthyroid    Past Surgical History:  Procedure Laterality Date  . APPENDECTOMY    . CESAREAN SECTION    . COLONOSCOPY  09-10-10   per Dr. Acquanetta Sit, diverticulosis of descending colon and sigmoid, internal hemorrhoids, repeat in 10 yrs  . COLOSTOMY N/A 02/20/2015   Procedure: COLOSTOMY;  Surgeon: Jackolyn Confer, MD;  Location: [facility];  Service: General;  Laterality: N/A;  . COLOSTOMY REVISION  02/20/2015   Procedure: Sigmoid Colectomy;  Surgeon: Jackolyn Confer, MD;  Location: [facility];   Service: General;;  . CYSTOSCOPY N/A 02/20/2015   Procedure: Consuela Mimes;  Surgeon: Kathie Rhodes, MD;  [facility];  Service: Urology;  Laterality: N/A;  . DILATION AND CURETTAGE OF UTERUS     x3  . ILEO LOOP COLOSTOMY CLOSURE N/A 09/26/2015   Procedure: LAPAROSCOPIC LYSIS OF ADHESIONS, COLOSTOMY CLOSURE;  Surgeon: Jackolyn Confer, MD;  [facility];  Service: General;  Laterality: N/A;  . LAPAROTOMY N/A 02/20/2015   Procedure: Emergency EXPLORATORY and drainiage of interabdominal abcesses;  Surgeon: Jackolyn Confer, MD;  [facility];  Service: General;  Laterality: N/A;  . SALPINGOOPHORECTOMY Left 02/20/2015   Procedure: SALPINGO OOPHORECTOMY;  Surgeon: Jackolyn Confer, MD;  [facility];  Service: General;  Laterality: Left;  . TONSILLECTOMY  1980    There were no vitals filed for this visit.   Subjective Assessment - 03/24/21 1717    Subjective My hip has really been hurting me the last week or so.    Currently in Pain? Yes    Pain Score 7     Pain Location Hip    Pain Orientation Left    Pain Descriptors / Indicators Aching    Aggravating Factors  moving           TREATMENT Pt seen for aquatic therapy today.  Treatment  took place in water 3.25-4.8 ft in depth at the [facility]. Temp of water was 91.  Pt entered/exited the pool via stairs step to pattern independently with bilat rail.  Warm up: forward, backward and side stepping/walking cues for increased step length, increased speed, hand placement to increase resistance.  Stretching  Standing hip flex using noodle pt facing wall. Hamstring and gastroc leaning against wall. LB completing post pelvic tilts.  Strengthening Using ankle cuffs add/abd, hip flex, ext x 10 reps.  Cueing for proper tech and positioning.  Core kick board pushdowns 2 x 10 reps.  Cuing for tight core. Progressed to water walking forward and back completing kick board pushdowns Activation of anterolateral abdominal ms in  supine, suspended with buoys, PT facilitating maneuvering pt at shoulder x 10 mins.    Pt requires buoyancy for support and to offload joints with strengthening exercises. Viscosity of the water is needed for resistance of strengthening; water current perturbations provides challenge to standing balance unsupported, requiring increased core activation.                               PT Short Term Goals - 02/13/21 1151      PT SHORT TERM GOAL #1   Title pt will be independent with HEP as it has been established in the short term.    Status Achieved      PT SHORT TERM GOAL #2   Title pt will verbalize feeling some improvement in overall mobility through ADLs.    Status Achieved             PT Long Term Goals - 03/17/21 1314      PT LONG TERM GOAL #1   Title pt will be able to navigate stairs at her home with confidence and necessary strength for reciprocal pattern    Baseline alternating on 3 steps on front, main entrance is 5-6 steps and does a step-to pattern      PT LONG TERM GOAL #2   Title 5TSTS to improve by MDC of 4s    Baseline 13s today, community dwelling norm for age group is 11.4    Status Partially Met      PT LONG TERM GOAL #3   Title BERG to improve by [place] of 3 points    Baseline improved by 6 points, 48/56, will benefit from further balance improvement to reach functional level desired    Status Partially Met      PT LONG TERM GOAL #4   Title 6MWT to improve by [place] of 191 ft    Baseline 805 ft, age appropriate 1765 and will continue to work toward this    Status Partially Met                 Plan - 03/24/21 1720    Clinical Impression Statement Pt with increased discmfort in left hip, reports cracking and popping.  Does limit her some today with stretching and strengtheing, although she was able to tolerate core activities modified for benefit.  Added axial core stretch and strengthening which relieved LBP while completing  in sup faciitated by therapist.    Personal Factors and Comorbidities Time since onset of injury/illness/exacerbation;Fitness;Comorbidity 3+    Examination-Activity Limitations Squat;Stairs;Stand;Carry;Locomotion Level;Sit    Examination-Participation Restrictions Occupation;Community Activity;Shop    Stability/Clinical Decision Making Evolving/Moderate complexity    Clinical Decision Making Moderate    Rehab Potential Good  PT Frequency 2x / week    PT Duration 8 weeks    PT Treatment/Interventions ADLs/Self Care Home Management;Aquatic Therapy;Cryotherapy;Electrical Stimulation;Moist Heat;Balance training;Therapeutic exercise;Therapeutic activities;Functional mobility training;Stair training;Gait training;Neuromuscular re-education;Patient/family education;Manual techniques;Taping;Dry needling;Passive range of motion    PT Next Visit Plan hip pain?? How did you tolerate new axial core ex?    PT Home Exercise Plan [facility]    Consulted and Agree with Plan of Care Patient           Patient will benefit from skilled therapeutic intervention in order to improve the following deficits and impairments:  Decreased strength,Postural dysfunction,Difficulty walking,Decreased activity tolerance,Decreased mobility,Decreased balance,Pain,Decreased endurance  Visit Diagnosis: No diagnosis found.     Problem List Patient Active Problem List   Diagnosis Date Noted  . Psoriasis 01/15/2021  . Chronic pain of left knee 01/15/2021  . Ventral hernia without obstruction or gangrene 01/15/2021  . Class 3 severe obesity with serious comorbidity and body mass index (BMI) of 40.0 to 44.9 in adult [facility]) 01/15/2019  . Acne rosacea 05/10/2016  . Multinodular goiter 02/27/2016  . 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 Colostomy in place [facility]) s/p colostomy reversal 09/26/15 09/26/2015  . Prediabetes 07/30/2015  . Perforation of sigmoid colon ([place]) 02/20/2015  . Diverticulitis of large intestine with  abscess without bleeding 02/18/2015  . OSA (obstructive sleep apnea) 05/09/2013  . GASTROENTERITIS 08/18/2010  . RAYNAUD'S SYNDROME 02/17/2009  . PLANTAR FASCIITIS 02/17/2009  . Asthma 06/12/2008  . Hyperlipidemia 09/04/2007  . DEVIATED SEPTUM 09/04/2007  . ALLERGIC RHINITIS 09/04/2007  . GERD 09/04/2007  . OSTEOARTHRITIS 09/04/2007  . HEADACHE 09/04/2007    Vedia Pereyra MPT 03/24/2021, 5:29 PM  [facility] [address] [place], [place], [address] Phone: [phone]   Fax:  [phone]  Name: Guenevere Roorda [facility] MRN: [id number] Date of Birth: 12-05-1955

## 2021-03-31 ENCOUNTER — Encounter (HOSPITAL_BASED_OUTPATIENT_CLINIC_OR_DEPARTMENT_OTHER): Payer: Self-pay | Admitting: Physical Therapy

## 2021-03-31 ENCOUNTER — Other Ambulatory Visit: Payer: Self-pay

## 2021-03-31 ENCOUNTER — Ambulatory Visit (HOSPITAL_BASED_OUTPATIENT_CLINIC_OR_DEPARTMENT_OTHER): Payer: BC Managed Care – PPO | Attending: Family Medicine | Admitting: Physical Therapy

## 2021-03-31 DIAGNOSIS — R262 Difficulty in walking, not elsewhere classified: Secondary | ICD-10-CM | POA: Diagnosis not present

## 2021-03-31 DIAGNOSIS — M6281 Muscle weakness (generalized): Secondary | ICD-10-CM | POA: Insufficient documentation

## 2021-03-31 DIAGNOSIS — M25562 Pain in left knee: Secondary | ICD-10-CM | POA: Diagnosis not present

## 2021-03-31 DIAGNOSIS — G8929 Other chronic pain: Secondary | ICD-10-CM

## 2021-03-31 NOTE — Therapy (Signed)
[facility] [address] [place], [place], [address] Phone: [phone]   Fax:  [phone]  Physical Therapy Treatment  Patient Details  Name: Alison Best MRN: [id number] Date of Birth: 1956-04-21 Referring Provider (PT): Laurey Morale, MD   Encounter Date: 03/31/2021   PT End of Session - 03/31/21 1844    Visit Number 13    Number of Visits 27    Date for PT Re-Evaluation 05/15/21    Authorization Type BCBS 30 VL    PT Start Time 1540    PT Stop Time 1617    PT Time Calculation (min) 37 min    Equipment Utilized During Treatment Other (comment)    Activity Tolerance Patient tolerated treatment well    Behavior During Therapy [facility] for tasks assessed/performed           Past Medical History:  Diagnosis Date  . Anal fissure   . Arthritis   . Bursitis   . Constipation   . COPD (chronic obstructive pulmonary disease) ([place])   . Diabetes mellitus without complication ([employer])    sees Dr. Cruzita Lederer   . Diverticulitis of colon 02/2015  . Ectopic pregnancy   . Emphysema of lung ([place])   . Frozen shoulder   . Heart murmur   . Hernia, abdominal   . Hyperlipidemia   . Hyperthyroidism   . Infertility, female   . Irregular heartbeat   . Joint pain   . Obesity   . Osteoarthritis   . Sleep apnea   . Swallowing difficulty   . Thyroid disease    hyperthyroid    Past Surgical History:  Procedure Laterality Date  . APPENDECTOMY    . CESAREAN SECTION    . COLONOSCOPY  09-10-10   per Dr. Acquanetta Sit, diverticulosis of descending colon and sigmoid, internal hemorrhoids, repeat in 10 yrs  . COLOSTOMY N/A 02/20/2015   Procedure: COLOSTOMY;  Surgeon: Jackolyn Confer, MD;  [facility];  Service: General;  Laterality: N/A;  . COLOSTOMY REVISION  02/20/2015   Procedure: Sigmoid Colectomy;  Surgeon: Jackolyn Confer, MD;  [facility];  Service: General;;  . CYSTOSCOPY N/A 02/20/2015   Procedure: Consuela Mimes;   Surgeon: Kathie Rhodes, MD;  [facility];  Service: Urology;  Laterality: N/A;  . DILATION AND CURETTAGE OF UTERUS     x3  . ILEO LOOP COLOSTOMY CLOSURE N/A 09/26/2015   Procedure: LAPAROSCOPIC LYSIS OF ADHESIONS, COLOSTOMY CLOSURE;  Surgeon: Jackolyn Confer, MD;  [facility];  Service: General;  Laterality: N/A;  . LAPAROTOMY N/A 02/20/2015   Procedure: Emergency EXPLORATORY and drainiage of interabdominal abcesses;  Surgeon: Jackolyn Confer, MD;  [facility];  Service: General;  Laterality: N/A;  . SALPINGOOPHORECTOMY Left 02/20/2015   Procedure: SALPINGO OOPHORECTOMY;  Surgeon: Jackolyn Confer, MD;  [facility];  Service: General;  Laterality: Left;  . TONSILLECTOMY  1980    There were no vitals filed for this visit.   Subjective Assessment - 03/31/21 1841    Subjective " I felt the best I have felt for days after last session".  May have stretched out some of this scar tissue.    How long can you walk comfortably? car to PT clinic is no longer an issue; was able to walk museum with rollator, can do an hour of walking around with little rest breaks in rollator    Patient Stated Goals Sept 1st going on a trip to [place].get moving again & feeling better.  improve balance, stairs    Currently in Pain? Yes    Pain Score 4     Pain Location Hip    Pain Orientation Left    Pain Descriptors / Indicators Aching    Pain Type Chronic pain    Pain Onset More than a month ago    Pain Frequency Constant    Aggravating Factors  moving    Pain Relieving Factors aquatic therapy/water submersion           TREATMENT Pt seen for aquatic therapy today.  Treatment took place in water 3.25-4.8 ft in depth at the [employer] [facility] pool. Temp of water was 91.  Pt entered/exited the pool via stairs step to pattern independently with bilat rail.  Warm up: forward, backward and side stepping/walking cues for increased step length, increased speed, hand placement to increase  resistance.  Stretching  Standing hip flex using noodle pt facing wall. Hamstring and gastroc leaning against wall. LB completing post pelvic tilts.  Strengthening Using ankle cuffs add/abd, hip flex, ext x 10 reps.  Cueing for proper tech and positioning.  Core kick board pushdowns 3 x 10 reps.  Cuing for tight core.  Bilateral knees to chest/abdominal strengthening and LB stretch supported/facilitated by PT 80% submerged x 10 reps  LB stretching/decompression and Activation of anterolateral abdominal ms in supine, suspended with buoys, PT facilitating maneuvering pt at shoulder x 10 mins.   Pt requires buoyancy for support and to offload joints with strengthening exercises. Viscosity of the water is needed for resistance of strengthening; water current perturbations provides challenge to standing balance unsupported, requiring increased core activation.                           PT Short Term Goals - 02/13/21 1151      PT SHORT TERM GOAL #1   Title pt will be independent with HEP as it has been established in the short term.    Status Achieved      PT SHORT TERM GOAL #2   Title pt will verbalize feeling some improvement in overall mobility through ADLs.    Status Achieved             PT Long Term Goals - 03/17/21 1314      PT LONG TERM GOAL #1   Title pt will be able to navigate stairs at her home with confidence and necessary strength for reciprocal pattern    Baseline alternating on 3 steps on front, main entrance is 5-6 steps and does a step-to pattern      PT LONG TERM GOAL #2   Title 5TSTS to improve by MDC of 4s    Baseline 13s today, community dwelling norm for age group is 11.4    Status Partially Met      PT LONG TERM GOAL #3   Title BERG to improve by [place] of 3 points    Baseline improved by 6 points, 48/56, will benefit from further balance improvement to reach functional level desired    Status Partially Met      PT LONG TERM GOAL  #4   Title 6MWT to improve by [place] of 191 ft    Baseline 19 ft, age appropriate 31 and will continue to work toward this    Status Partially Met                 Plan - 03/31/21 1846    Clinical  Impression Statement Pt with improved/decrease pain in right hip.  Late for session, was able to complete core strengthening and stretching in standing then with concentration on supine suspended passive and active axial/core activities as I do suspect the movement gained and strengthening completed in this position is most beneficial. Pt tolerates very well demonstrating improved axial strength with cooridination and control of axial core.    Personal Factors and Comorbidities Time since onset of injury/illness/exacerbation;Fitness;Comorbidity 3+    Examination-Activity Limitations Squat;Stairs;Stand;Carry;Locomotion Level;Sit    Examination-Participation Restrictions Occupation;Community Activity;Shop    Stability/Clinical Decision Making Evolving/Moderate complexity    Clinical Decision Making Moderate    Rehab Potential Good    PT Frequency 2x / week    PT Duration 8 weeks    PT Treatment/Interventions ADLs/Self Care Home Management;Aquatic Therapy;Cryotherapy;Electrical Stimulation;Moist Heat;Balance training;Therapeutic exercise;Therapeutic activities;Functional mobility training;Stair training;Gait training;Neuromuscular re-education;Patient/family education;Manual techniques;Taping;Dry needling;Passive range of motion    PT Next Visit Plan Advance axial/core strengthening    PT Home Exercise Plan [id number]    Consulted and Agree with Plan of Care Patient           Patient will benefit from skilled therapeutic intervention in order to improve the following deficits and impairments:  Decreased strength,Postural dysfunction,Difficulty walking,Decreased activity tolerance,Decreased mobility,Decreased balance,Pain,Decreased endurance  Visit Diagnosis: Chronic pain of left knee  Muscle  weakness (generalized)  Difficulty in walking, not elsewhere classified     Problem List Patient Active Problem List   Diagnosis Date Noted  . Psoriasis 01/15/2021  . Chronic pain of left knee 01/15/2021  . Ventral hernia without obstruction or gangrene 01/15/2021  . Class 3 severe obesity with serious comorbidity and body mass index (BMI) of 40.0 to 44.9 in adult ([facility]) 01/15/2019  . Acne rosacea 05/10/2016  . Multinodular goiter 02/27/2016  . 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 09/26/15 09/26/2015  . Prediabetes 07/30/2015  . Perforation of sigmoid colon ([facility]) 02/20/2015  . Diverticulitis of large intestine with abscess without bleeding 02/18/2015  . OSA (obstructive sleep apnea) 05/09/2013  . GASTROENTERITIS 08/18/2010  . RAYNAUD'S SYNDROME 02/17/2009  . PLANTAR FASCIITIS 02/17/2009  . Asthma 06/12/2008  . Hyperlipidemia 09/04/2007  . DEVIATED SEPTUM 09/04/2007  . ALLERGIC RHINITIS 09/04/2007  . GERD 09/04/2007  . OSTEOARTHRITIS 09/04/2007  . HEADACHE 09/04/2007    Mary F Ziemba MPT 03/31/2021, 6:56 PM  [facility] [address] [place], [place], [address] Phone: [phone]   Fax:  [phone]  Name: Alison Best MRN: [id number] Date of Birth: 09/11/1956   

## 2021-04-01 ENCOUNTER — Encounter: Payer: Self-pay | Admitting: Family Medicine

## 2021-04-01 NOTE — Telephone Encounter (Signed)
Set up an IN person OV so we can re-evaluate these issues

## 2021-04-02 ENCOUNTER — Other Ambulatory Visit (INDEPENDENT_AMBULATORY_CARE_PROVIDER_SITE_OTHER): Payer: BC Managed Care – PPO

## 2021-04-02 ENCOUNTER — Other Ambulatory Visit: Payer: Self-pay

## 2021-04-02 DIAGNOSIS — E0590 Thyrotoxicosis, unspecified without thyrotoxic crisis or storm: Secondary | ICD-10-CM | POA: Diagnosis not present

## 2021-04-02 LAB — TSH: TSH: 1.71 u[IU]/mL (ref 0.35–4.50)

## 2021-04-02 LAB — T3, FREE: T3, Free: 5.6 pg/mL — ABNORMAL HIGH (ref 2.3–4.2)

## 2021-04-02 LAB — T4, FREE: Free T4: 1.17 ng/dL (ref 0.60–1.60)

## 2021-04-03 ENCOUNTER — Other Ambulatory Visit: Payer: Self-pay | Admitting: Internal Medicine

## 2021-04-03 ENCOUNTER — Ambulatory Visit (HOSPITAL_BASED_OUTPATIENT_CLINIC_OR_DEPARTMENT_OTHER): Payer: BC Managed Care – PPO | Admitting: Physical Therapy

## 2021-04-03 ENCOUNTER — Ambulatory Visit: Payer: BC Managed Care – PPO | Admitting: Family Medicine

## 2021-04-03 ENCOUNTER — Encounter: Payer: Self-pay | Admitting: Family Medicine

## 2021-04-03 VITALS — BP 138/78 | HR 78 | Temp 98.3°F | Wt 220.0 lb

## 2021-04-03 DIAGNOSIS — E0590 Thyrotoxicosis, unspecified without thyrotoxic crisis or storm: Secondary | ICD-10-CM

## 2021-04-03 DIAGNOSIS — G8929 Other chronic pain: Secondary | ICD-10-CM | POA: Diagnosis not present

## 2021-04-03 DIAGNOSIS — R262 Difficulty in walking, not elsewhere classified: Secondary | ICD-10-CM

## 2021-04-03 DIAGNOSIS — M25562 Pain in left knee: Secondary | ICD-10-CM

## 2021-04-03 DIAGNOSIS — R1900 Intra-abdominal and pelvic swelling, mass and lump, unspecified site: Secondary | ICD-10-CM | POA: Diagnosis not present

## 2021-04-03 DIAGNOSIS — K439 Ventral hernia without obstruction or gangrene: Secondary | ICD-10-CM | POA: Diagnosis not present

## 2021-04-03 DIAGNOSIS — M6281 Muscle weakness (generalized): Secondary | ICD-10-CM

## 2021-04-03 DIAGNOSIS — M25552 Pain in left hip: Secondary | ICD-10-CM | POA: Diagnosis not present

## 2021-04-03 DIAGNOSIS — R1032 Left lower quadrant pain: Secondary | ICD-10-CM | POA: Diagnosis not present

## 2021-04-03 NOTE — Addendum Note (Signed)
Addended by: Adah Salvage F on: 04/03/2021 04:00 PM   Modules accepted: Orders

## 2021-04-03 NOTE — Progress Notes (Signed)
   Subjective:    Patient ID: Alison Best, female    DOB: 10/27/1955, 65 y.o.   MRN: [id number]  HPI Here for several issues. First she has constant LLQ abdominal pain that likely stems from a very large pannus that is largest on the left side. Her BMs are regular. No nausea or fever. She asks if there is a surgery that could remove this. Also she is getting PT for her left knee pain and this has ben very helpful. However she is now having siginifcant left hip pain as well. No recent falls.    Review of Systems  Constitutional: Negative.   Respiratory: Negative.    Cardiovascular: Negative.   Gastrointestinal:  Positive for abdominal pain. Negative for abdominal distention, anal bleeding, blood in stool, constipation, diarrhea, nausea, rectal pain and vomiting.  Genitourinary: Negative.   Musculoskeletal:  Positive for arthralgias.      Objective:   Physical Exam Constitutional:      Comments: Walks with a walker   Cardiovascular:     Rate and Rhythm: Normal rate and regular rhythm.     Pulses: Normal pulses.     Heart sounds: Normal heart sounds.  Pulmonary:     Effort: Pulmonary effort is normal.     Breath sounds: Normal breath sounds.  Abdominal:     General: Bowel sounds are normal. There is distension.     Tenderness: There is no right CVA tenderness, guarding or rebound.     Hernia: No hernia is present.     Comments: The LLQ has a huge pannus that is asymmetrical . This area is tender.   Neurological:     Mental Status: She is alert.          Assessment & Plan:  For the LLQ pain, we will set up an abdominal MRI soon. For the left hip pain, we will get an Xray. We spent 35 minutes discussing these issues today. Alysia Penna, MD

## 2021-04-03 NOTE — Addendum Note (Signed)
Addended by: Adah Salvage F on: 04/03/2021 03:59 PM   Modules accepted: Orders

## 2021-04-03 NOTE — Addendum Note (Signed)
Addended by: Adah Salvage F on: 04/03/2021 03:08 PM   Modules accepted: Orders

## 2021-04-04 ENCOUNTER — Encounter (HOSPITAL_BASED_OUTPATIENT_CLINIC_OR_DEPARTMENT_OTHER): Payer: Self-pay | Admitting: Physical Therapy

## 2021-04-04 LAB — BASIC METABOLIC PANEL WITH GFR
BUN/Creatinine Ratio: 19 (calc) (ref 6–22)
BUN: 20 mg/dL (ref 7–25)
CO2: 23 mmol/L (ref 20–32)
Calcium: 10 mg/dL (ref 8.6–10.4)
Chloride: 106 mmol/L (ref 98–110)
Creat: 1.06 mg/dL — ABNORMAL HIGH (ref 0.50–0.99)
GFR, Est African American: 65 mL/min/{1.73_m2} (ref >=60)
GFR, Est Non African American: 56 mL/min/{1.73_m2} — ABNORMAL LOW (ref >=60)
Glucose, Bld: 109 mg/dL — ABNORMAL HIGH (ref 65–99)
Potassium: 3.9 mmol/L (ref 3.5–5.3)
Sodium: 143 mmol/L (ref 135–146)

## 2021-04-05 NOTE — Therapy (Signed)
[facility] [address] [place], [place], [address] Phone: [phone]   Fax:  [phone]  Physical Therapy Treatment  Patient Details  Name: Alison Best MRN: [id number] Date of Birth: [birth year] Referring Provider (PT): Laurey Morale, MD   Encounter Date: 04/03/2021   PT End of Session - 04/04/21 1445     Visit Number 14    Number of Visits 27    Date for PT Re-Evaluation 05/15/21    Authorization Type BCBS 30 VL    PT Start Time 1200    PT Stop Time 1245    PT Time Calculation (min) 45 min    Equipment Utilized During Treatment Other (comment)    Activity Tolerance Patient tolerated treatment well    Behavior During Therapy [facility] for tasks assessed/performed             Past Medical History:  Diagnosis Date   Anal fissure    Arthritis    Bursitis    Constipation    COPD (chronic obstructive pulmonary disease) ([place])    Diabetes mellitus without complication ([place])    sees Dr. Cruzita Lederer    Diverticulitis of colon 02/2015   Ectopic pregnancy    Emphysema of lung ([place])    Frozen shoulder    Heart murmur    Hernia, abdominal    Hyperlipidemia    Hyperthyroidism    Infertility, female    Irregular heartbeat    Joint pain    Obesity    Osteoarthritis    Sleep apnea    Swallowing difficulty    Thyroid disease    hyperthyroid    Past Surgical History:  Procedure Laterality Date   APPENDECTOMY     CESAREAN SECTION     COLONOSCOPY  09-10-10   per Dr. Acquanetta Sit, diverticulosis of descending colon and sigmoid, internal hemorrhoids, repeat in 10 yrs   COLOSTOMY N/A 02/20/2015   Procedure: COLOSTOMY;  Surgeon: Jackolyn Confer, MD;  Location: [facility];  Service: General;  Laterality: N/A;   COLOSTOMY REVISION  02/20/2015   Procedure: Sigmoid Colectomy;  Surgeon: Jackolyn Confer, MD;  Location: [facility];  Service: General;;   CYSTOSCOPY N/A 02/20/2015   Procedure: Consuela Mimes;  Surgeon: Kathie Rhodes, MD;   Location: [facility];  Service: Urology;  Laterality: N/A;   DILATION AND CURETTAGE OF UTERUS     x3   ILEO LOOP COLOSTOMY CLOSURE N/A 09/26/2015   Procedure: LAPAROSCOPIC LYSIS OF ADHESIONS, COLOSTOMY CLOSURE;  Surgeon: Jackolyn Confer, MD;  Location: [facility];  Service: General;  Laterality: N/A;   LAPAROTOMY N/A 02/20/2015   Procedure: Emergency EXPLORATORY and drainiage of interabdominal abcesses;  Surgeon: Jackolyn Confer, MD;  Location: [facility];  Service: General;  Laterality: N/A;   SALPINGOOPHORECTOMY Left 02/20/2015   Procedure: SALPINGO OOPHORECTOMY;  Surgeon: Jackolyn Confer, MD;  Location: [facility];  Service: General;  Laterality: Left;   TONSILLECTOMY  1980    There were no vitals filed for this visit.   Subjective Assessment - 04/04/21 1443     Subjective I'm still feeling better, seem to be stretching my abdomin well, but I am worried about my hip pain.  See MD soon and hoping to have testing completed to identify issue              TREATMENT Pt seen for aquatic therapy today.  Treatment took place in water 3.25-4.8 ft in depth at the [facility] pool. Temp of water was 91.  Pt entered/exited the pool  via stairs step to pattern independently with bilat rail.   Warm up: forward, backward and side stepping/walking cues for increased step length, increased speed, hand placement to increase resistance.   Stretching Standing hip flex using noodle pt facing wall. Hamstring and gastroc leaning against wall. LB completing post pelvic tilts.   Strengthening Using ankle cuffs add/abd, hip flex, ext x 10 reps.  Cueing for proper tech and positioning. Core kick board pushdowns 3 x 10 reps.  Cuing for tight core.  Bilateral knees to chest/abdominal strengthening and LB stretch supported/facilitated by PT 80% submerged 2 x 10 reps cueing for increased effort and speed   LB stretching/decompression and Activation of anterolateral abdominal ms in supine, suspended with buoys, PT  facilitating maneuvering pt at shoulder x 10 mins.     Pt requires buoyancy for support and to offload joints with strengthening exercises. Viscosity of the water is needed for resistance of strengthening; water current perturbations provides challenge to standing balance unsupported, requiring increased core activation                           PT Short Term Goals - 02/13/21 1151       PT SHORT TERM GOAL #1   Title pt will be independent with HEP as it has been established in the short term.    Status Achieved      PT SHORT TERM GOAL #2   Title pt will verbalize feeling some improvement in overall mobility through ADLs.    Status Achieved               PT Long Term Goals - 03/17/21 1314       PT LONG TERM GOAL #1   Title pt will be able to navigate stairs at her home with confidence and necessary strength for reciprocal pattern    Baseline alternating on 3 steps on front, main entrance is 5-6 steps and does a step-to pattern      PT LONG TERM GOAL #2   Title 5TSTS to improve by MDC of 4s    Baseline 13s today, community dwelling norm for age group is 11.4    Status Partially Met      PT LONG TERM GOAL #3   Title BERG to improve by [place] of 3 points    Baseline improved by 6 points, 48/56, will benefit from further balance improvement to reach functional level desired    Status Partially Met      PT LONG TERM GOAL #4   Title 6MWT to improve by [place] of 191 ft    Baseline 29 ft, age appropriate 1765 and will continue to work toward this    Status Partially Met                   Plan - 04/05/21 1426     Clinical Impression Statement Completing aquatic therapy with excellent motivation.  Progressed supine suspended exercise, cuing for increased effort/speed with  knees to chest then ext,  jack knife (hip flex/extension knee straight) for erector spinae and abdminal strengthening.    Personal Factors and Comorbidities Time since onset of  injury/illness/exacerbation;Fitness;Comorbidity 3+    Comorbidities obesity, OA, COPD, DM, emphysema    Examination-Activity Limitations Squat;Stairs;Stand;Carry;Locomotion Level;Sit    Examination-Participation Restrictions Occupation;Community Activity;Shop    Stability/Clinical Decision Making Evolving/Moderate complexity    Clinical Decision Making Moderate    Rehab Potential Good    PT Frequency 2x /  week    PT Duration 8 weeks    PT Treatment/Interventions ADLs/Self Care Home Management;Aquatic Therapy;Cryotherapy;Electrical Stimulation;Moist Heat;Balance training;Therapeutic exercise;Therapeutic activities;Functional mobility training;Stair training;Gait training;Neuromuscular re-education;Patient/family education;Manual techniques;Taping;Dry needling;Passive range of motion    PT Next Visit Plan stair climbing ability    PT Home Exercise Plan [facility]             Patient will benefit from skilled therapeutic intervention in order to improve the following deficits and impairments:  Decreased strength, Postural dysfunction, Difficulty walking, Decreased activity tolerance, Decreased mobility, Decreased balance, Pain, Decreased endurance  Visit Diagnosis: Chronic pain of left knee  Muscle weakness (generalized)  Difficulty in walking, not elsewhere classified     Problem List Patient Active Problem List   Diagnosis Date Noted   Psoriasis 01/15/2021   Chronic pain of left knee 01/15/2021   Ventral hernia without obstruction or gangrene 01/15/2021   Class 3 severe obesity with serious comorbidity and body mass index (BMI) of 40.0 to 44.9 in adult [facility]) 01/15/2019   Acne rosacea 05/10/2016   Multinodular goiter 02/27/2016   Subclinical thyrotoxicosis 02/26/2016   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 [place], [place], [address] Phone: [phone]   Fax:  [phone]  Name: Cintia Gleed [facility] MRN: [id number] Date of Birth: 11-Jul-1956

## 2021-04-07 ENCOUNTER — Other Ambulatory Visit: Payer: Self-pay

## 2021-04-07 ENCOUNTER — Ambulatory Visit (HOSPITAL_BASED_OUTPATIENT_CLINIC_OR_DEPARTMENT_OTHER): Payer: BC Managed Care – PPO | Admitting: Physical Therapy

## 2021-04-07 ENCOUNTER — Encounter (HOSPITAL_BASED_OUTPATIENT_CLINIC_OR_DEPARTMENT_OTHER): Payer: Self-pay | Admitting: Physical Therapy

## 2021-04-07 DIAGNOSIS — M6281 Muscle weakness (generalized): Secondary | ICD-10-CM

## 2021-04-07 DIAGNOSIS — R262 Difficulty in walking, not elsewhere classified: Secondary | ICD-10-CM | POA: Diagnosis not present

## 2021-04-07 DIAGNOSIS — M25562 Pain in left knee: Secondary | ICD-10-CM

## 2021-04-07 DIAGNOSIS — G8929 Other chronic pain: Secondary | ICD-10-CM | POA: Diagnosis not present

## 2021-04-07 NOTE — Therapy (Signed)
[facility] [address] [place], [place], [address] Phone: [phone]   Fax:  [phone]  Physical Therapy Treatment  Patient Details  Name: Alison Best [facility] MRN: [id number] Date of Birth: 09-16-1956 Referring Provider (PT): Laurey Morale, MD   Encounter Date: 04/07/2021   PT End of Session - 04/07/21 1738     Visit Number 15    Number of Visits 27    Date for PT Re-Evaluation 05/15/21    Authorization Type BCBS 30 VL    PT Start Time 1531    PT Stop Time 1615    PT Time Calculation (min) 44 min    Equipment Utilized During Treatment Other (comment)    Activity Tolerance Patient tolerated treatment well             Past Medical History:  Diagnosis Date   Anal fissure    Arthritis    Bursitis    Constipation    COPD (chronic obstructive pulmonary disease) ([place])    Diabetes mellitus without complication ([place])    sees Dr. Cruzita Lederer    Diverticulitis of colon 02/2015   Ectopic pregnancy    Emphysema of lung ([place])    Frozen shoulder    Heart murmur    Hernia, abdominal    Hyperlipidemia    Hyperthyroidism    Infertility, female    Irregular heartbeat    Joint pain    Obesity    Osteoarthritis    Sleep apnea    Swallowing difficulty    Thyroid disease    hyperthyroid    Past Surgical History:  Procedure Laterality Date   APPENDECTOMY     CESAREAN SECTION     COLONOSCOPY  09-10-10   per Dr. Acquanetta Sit, diverticulosis of descending colon and sigmoid, internal hemorrhoids, repeat in 10 yrs   COLOSTOMY N/A 02/20/2015   Procedure: COLOSTOMY;  Surgeon: Jackolyn Confer, MD;  [facility];  Service: General;  Laterality: N/A;   COLOSTOMY REVISION  02/20/2015   Procedure: Sigmoid Colectomy;  Surgeon: Jackolyn Confer, MD;  Location[facility];  Service: General;;   CYSTOSCOPY N/A 02/20/2015   Procedure: Consuela Mimes;  Surgeon: Kathie Rhodes, MD;  [facility];  Service: Urology;  Laterality: N/A;    DILATION AND CURETTAGE OF UTERUS     x3   ILEO LOOP COLOSTOMY CLOSURE N/A 09/26/2015   Procedure: LAPAROSCOPIC LYSIS OF ADHESIONS, COLOSTOMY CLOSURE;  Surgeon: Jackolyn Confer, MD;  [facility];  Service: General;  Laterality: N/A;   LAPAROTOMY N/A 02/20/2015   Procedure: Emergency EXPLORATORY and drainiage of interabdominal abcesses;  Surgeon: Jackolyn Confer, MD;  [facility];  Service: General;  Laterality: N/A;   SALPINGOOPHORECTOMY Left 02/20/2015   Procedure: SALPINGO OOPHORECTOMY;  Surgeon: Jackolyn Confer, MD;  [facility];  Service: General;  Laterality: Left;   TONSILLECTOMY  1980    There were no vitals filed for this visit.   Subjective Assessment - 04/07/21 1736     Subjective My right shoulder hurt for a few days after last visit.  May have been the kick board exercises.  Will have images made of  my abdomin as per PCP                 TREATMENT Pt seen for aquatic therapy today.  Treatment took place in water 3.25-4.8 ft in depth at the [facility] pool. Temp of water was 91.  Pt entered/exited the pool via stairs step to pattern independently with bilat  rail.   Warm up: forward, backward and side stepping/walking cues for increased step length, increased speed, hand placement to increase resistance.   ROM/stretching  Seated on water bench and Standing Shoulder horizontal add/abd, adduction/abd, flex x 10 reps Core activation in seated position, cuing for abd tightness, kicking with ue extended 3 sets of 20 secs [place] kick downs supported by hand buoys 2 x 10 reps. Bilateral knees to chest/abdominal strengthening and LB stretch supported/facilitated by PT 80% submerged 10 reps cueing for increased effort and speed  LB stretching/decompression and Activation/strengthening of anterolateral abdominal ms in supine, suspended with buoys, PT facilitating maneuvering pt at shoulder x 15 mins. Posterior pelvic tilts x 10, knee flex ext  x 10 cueing  for tight core positioning.      Pt requires buoyancy for support and to offload joints with strengthening exercises. Viscosity of the water is needed for resistance of strengthening; water current perturbations provides challenge to standing balance unsupported, requiring increased core activation                   PT Education - 04/07/21 1737     Education Details Probable soarness in right shoulder from last visit.  Will back off on kick board exercises to see if it subsides.    Person(s) Educated Patient    Methods Explanation    Comprehension Verbalized understanding              PT Short Term Goals - 02/13/21 1151       PT SHORT TERM GOAL #1   Title pt will be independent with HEP as it has been established in the short term.    Status Achieved      PT SHORT TERM GOAL #2   Title pt will verbalize feeling some improvement in overall mobility through ADLs.    Status Achieved               PT Long Term Goals - 03/17/21 1314       PT LONG TERM GOAL #1   Title pt will be able to navigate stairs at her home with confidence and necessary strength for reciprocal pattern    Baseline alternating on 3 steps on front, main entrance is 5-6 steps and does a step-to pattern      PT LONG TERM GOAL #2   Title 5TSTS to improve by MDC of 4s    Baseline 13s today, community dwelling norm for age group is 11.4    Status Partially Met      PT LONG TERM GOAL #3   Title BERG to improve by [place] of 3 points    Baseline improved by 6 points, 48/56, will benefit from further balance improvement to reach functional level desired    Status Partially Met      PT LONG TERM GOAL #4   Title 6MWT to improve by [place] of 191 ft    Baseline 50 ft, age appropriate 7 and will continue to work toward this    Status Partially Met                   Plan - 04/07/21 1739     Clinical Impression Statement ROM right shoulder wfl.  Some discomfort at end range and with  palpation postereir aspect.  Pt reports it is better in past day or so.  Modified session today to avoid core strengthening using kickboard in lateral rotation.  Concentration today on core strength and stretching.  Pt with excellent lateral core activation in supine suspension as well as abdominal "crunch" and erector spinae/core extension.    Personal Factors and Comorbidities Time since onset of injury/illness/exacerbation;Fitness;Comorbidity 3+    Comorbidities obesity, OA, COPD, DM, emphysema    Examination-Activity Limitations Squat;Stairs;Stand;Carry;Locomotion Level;Sit    Examination-Participation Restrictions Occupation;Community Activity;Shop    Stability/Clinical Decision Making Evolving/Moderate complexity    Clinical Decision Making Moderate    PT Frequency 2x / week    PT Duration 8 weeks    PT Treatment/Interventions ADLs/Self Care Home Management;Aquatic Therapy;Cryotherapy;Electrical Stimulation;Moist Heat;Balance training;Therapeutic exercise;Therapeutic activities;Functional mobility training;Stair training;Gait training;Neuromuscular re-education;Patient/family education;Manual techniques;Taping;Dry needling;Passive range of motion    PT Home Exercise Plan [facility]    Consulted and Agree with Plan of Care Patient             Patient will benefit from skilled therapeutic intervention in order to improve the following deficits and impairments:  Decreased strength, Postural dysfunction, Difficulty walking, Decreased activity tolerance, Decreased mobility, Decreased balance, Pain, Decreased endurance  Visit Diagnosis: Chronic pain of left knee  Muscle weakness (generalized)  Difficulty in walking, not elsewhere classified     Problem List Patient Active Problem List   Diagnosis Date Noted   Psoriasis 01/15/2021   Chronic pain of left knee 01/15/2021   Ventral hernia without obstruction or gangrene 01/15/2021   Class 3 severe obesity with serious comorbidity and  body mass index (BMI) of 40.0 to 44.9 in adult [facility]) 01/15/2019   Acne rosacea 05/10/2016   Multinodular goiter 02/27/2016   Subclinical thyrotoxicosis 02/26/2016   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 09/26/15 09/26/2015   Prediabetes 07/30/2015   Perforation of sigmoid colon ([place]) 02/20/2015   Diverticulitis of large intestine with abscess without bleeding 02/18/2015   OSA (obstructive sleep apnea) 05/09/2013   GASTROENTERITIS 08/18/2010   RAYNAUD'S SYNDROME 02/17/2009   PLANTAR FASCIITIS 02/17/2009   Asthma 06/12/2008   Hyperlipidemia 09/04/2007   DEVIATED SEPTUM 09/04/2007   ALLERGIC RHINITIS 09/04/2007   GERD 09/04/2007   OSTEOARTHRITIS 09/04/2007   HEADACHE 09/04/2007    Macky Lower Isom Kochan MPT 04/07/2021, 5:47 PM  [facility] [address] [place], [place], [address] Phone: [phone]   Fax:  [phone]  Name: Alison Best [facility] MRN: [id number] Date of Birth: 10/06/1956

## 2021-04-09 ENCOUNTER — Other Ambulatory Visit: Payer: Self-pay

## 2021-04-09 ENCOUNTER — Encounter (HOSPITAL_BASED_OUTPATIENT_CLINIC_OR_DEPARTMENT_OTHER): Payer: Self-pay | Admitting: Physical Therapy

## 2021-04-09 ENCOUNTER — Ambulatory Visit (HOSPITAL_BASED_OUTPATIENT_CLINIC_OR_DEPARTMENT_OTHER): Payer: BC Managed Care – PPO | Admitting: Physical Therapy

## 2021-04-09 DIAGNOSIS — M25562 Pain in left knee: Secondary | ICD-10-CM

## 2021-04-09 DIAGNOSIS — R262 Difficulty in walking, not elsewhere classified: Secondary | ICD-10-CM | POA: Diagnosis not present

## 2021-04-09 DIAGNOSIS — M6281 Muscle weakness (generalized): Secondary | ICD-10-CM

## 2021-04-09 DIAGNOSIS — G8929 Other chronic pain: Secondary | ICD-10-CM

## 2021-04-09 MED ORDER — DIAZEPAM 5 MG PO TABS: 5.0000 mg | ORAL_TABLET | Freq: Two times a day (BID) | ORAL | 0 refills | Status: DC | PRN

## 2021-04-09 NOTE — Therapy (Signed)
[facility] [address] [place], [place], [address] Phone: [phone]   Fax:  [phone]  Physical Therapy Treatment  Patient Details  Name: Alison Best [facility] MRN: [id number] Date of Birth: [birth year] Referring Provider (PT): Laurey Morale, MD   Encounter Date: 04/09/2021   PT End of Session - 04/09/21 1401     Visit Number 16    Number of Visits 27    Date for PT Re-Evaluation 05/15/21    Authorization Type BCBS 30 VL    PT Start Time 1032    PT Stop Time 1105    PT Time Calculation (min) 33 min    Equipment Utilized During Treatment Other (comment)    Activity Tolerance Patient tolerated treatment well             Past Medical History:  Diagnosis Date   Anal fissure    Arthritis    Bursitis    Constipation    COPD (chronic obstructive pulmonary disease) ([place])    Diabetes mellitus without complication ([place])    sees Dr. Cruzita Lederer    Diverticulitis of colon 02/2015   Ectopic pregnancy    Emphysema of lung ([place])    Frozen shoulder    Heart murmur    Hernia, abdominal    Hyperlipidemia    Hyperthyroidism    Infertility, female    Irregular heartbeat    Joint pain    Obesity    Osteoarthritis    Sleep apnea    Swallowing difficulty    Thyroid disease    hyperthyroid    Past Surgical History:  Procedure Laterality Date   APPENDECTOMY     CESAREAN SECTION     COLONOSCOPY  09-10-10   per Dr. Acquanetta Sit, diverticulosis of descending colon and sigmoid, internal hemorrhoids, repeat in 10 yrs   COLOSTOMY N/A 02/20/2015   Procedure: COLOSTOMY;  Surgeon: Jackolyn Confer, MD;  Location: [facility];  Service: General;  Laterality: N/A;   COLOSTOMY REVISION  02/20/2015   Procedure: Sigmoid Colectomy;  Surgeon: Jackolyn Confer, MD;  Location: [facility];  Service: General;;   CYSTOSCOPY N/A 02/20/2015   Procedure: Consuela Mimes;  Surgeon: Kathie Rhodes, MD;  Location: [facility];  Service: Urology;  Laterality: N/A;    DILATION AND CURETTAGE OF UTERUS     x3   ILEO LOOP COLOSTOMY CLOSURE N/A 09/26/2015   Procedure: LAPAROSCOPIC LYSIS OF ADHESIONS, COLOSTOMY CLOSURE;  Surgeon: Jackolyn Confer, MD;  [facility]: [facility];  Service: General;  Laterality: N/A;   LAPAROTOMY N/A 02/20/2015   Procedure: Emergency EXPLORATORY and drainiage of interabdominal abcesses;  Surgeon: Jackolyn Confer, MD;  [facility]: [facility];  Service: General;  Laterality: N/A;   SALPINGOOPHORECTOMY Left 02/20/2015   Procedure: SALPINGO OOPHORECTOMY;  Surgeon: Jackolyn Confer, MD;  [facility]: [facility];  Service: General;  Laterality: Left;   TONSILLECTOMY  1980    There were no vitals filed for this visit.   Subjective Assessment - 04/09/21 1359     Subjective Shoulder is much better.  Scans scheduled for tomorrow and Sat.  Hip and back continue to feel better    Currently in Pain? Yes    Pain Score 3     Pain Location Hip    Pain Orientation Left    Pain Descriptors / Indicators Aching    Pain Type Chronic pain    Pain Onset More than a month ago    Pain Frequency Constant    Aggravating Factors  moving    Pain  Relieving Factors aquatic therapy            TREATMENT Pt seen for aquatic therapy today.  Treatment took place in water 3.25-4.8 ft in depth at the [facility] pool. Temp of water was 91.  Pt entered/exited the pool via stairs step to pattern independently with bilat rail.   Warm up: forward, backward and side stepping/walking cues for increased step length, increased speed, hand placement to increase resistance.   Standing Hip extension using noddle stretching flexor 3 sets of 20 second hold.  Noodle pull through hip flex strengthening 3 x 10 reps.  Cuing for tight core and erect posture. Kick board pushdowns 3 x 10 reps with manual perturbations Anterolateral core ms activation lateral movements, using kick board pushing and pulling right and left 3 x 10 reps increasing submersion of board and increasing speed for  increased resistance. Core activation in seated position, cuing for abd tightness, kicking with ue extended 3 sets of 20 secs Noodle kick downs supported by hand buoys 2 x 10 reps.      Pt requires buoyancy for support and to offload joints with strengthening exercises. Viscosity of the water is needed for resistance of strengthening; water current perturbations provides challenge to standing balance unsupported, requiring increased core activation                             PT Short Term Goals - 02/13/21 1151       PT SHORT TERM GOAL #1   Title pt will be independent with HEP as it has been established in the short term.    Status Achieved      PT SHORT TERM GOAL #2   Title pt will verbalize feeling some improvement in overall mobility through ADLs.    Status Achieved               PT Long Term Goals - 03/17/21 1314       PT LONG TERM GOAL #1   Title pt will be able to navigate stairs at her home with confidence and necessary strength for reciprocal pattern    Baseline alternating on 3 steps on front, main entrance is 5-6 steps and does a step-to pattern      PT LONG TERM GOAL #2   Title 5TSTS to improve by MDC of 4s    Baseline 13s today, community dwelling norm for age group is 11.4    Status Partially Met      PT LONG TERM GOAL #3   Title BERG to improve by [place] of 3 points    Baseline improved by 6 points, 48/56, will benefit from further balance improvement to reach functional level desired    Status Partially Met      PT LONG TERM GOAL #4   Title 6MWT to improve by [place] of 191 ft    Baseline 13 ft, age appropriate 84 and will continue to work toward this    Status Partially Met                   Plan - 04/09/21 1403     Clinical Impression Statement Pt independent with HEP, Instructed through today to ensure understanding and put in workable order for memory while in pool. Increased reps of all exercises, pt completing  added aerobic capacity challenges.    Personal Factors and Comorbidities Time since onset of injury/illness/exacerbation;Fitness;Comorbidity 3+    Comorbidities obesity,  OA, COPD, DM, emphysema    Examination-Activity Limitations Squat;Stairs;Stand;Carry;Locomotion Level;Sit    Examination-Participation Restrictions Occupation;Community Activity;Shop    Stability/Clinical Decision Making Evolving/Moderate complexity    Clinical Decision Making Moderate    Rehab Potential Good    PT Frequency 2x / week    PT Duration 8 weeks    PT Treatment/Interventions ADLs/Self Care Home Management;Aquatic Therapy;Cryotherapy;Electrical Stimulation;Moist Heat;Balance training;Therapeutic exercise;Therapeutic activities;Functional mobility training;Stair training;Gait training;Neuromuscular re-education;Patient/family education;Manual techniques;Taping;Dry needling;Passive range of motion    PT Next Visit Plan stair climbing ability    PT Home Exercise Plan [facility]             Patient will benefit from skilled therapeutic intervention in order to improve the following deficits and impairments:  Decreased strength, Postural dysfunction, Difficulty walking, Decreased activity tolerance, Decreased mobility, Decreased balance, Pain, Decreased endurance  Visit Diagnosis: Chronic pain of left knee  Muscle weakness (generalized)  Difficulty in walking, not elsewhere classified     Problem List Patient Active Problem List   Diagnosis Date Noted   Psoriasis 01/15/2021   Chronic pain of left knee 01/15/2021   Ventral hernia without obstruction or gangrene 01/15/2021   Class 3 severe obesity with serious comorbidity and body mass index (BMI) of 40.0 to 44.9 in adult [facility]) 01/15/2019   Acne rosacea 05/10/2016   Multinodular goiter 02/27/2016   Subclinical thyrotoxicosis 02/26/2016   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 [place], [place], [address] Phone: [phone]   Fax:  [phone]  Name: Joydan Gretzinger [facility] MRN: [id number] Date of Birth: [birth year]

## 2021-04-09 NOTE — Telephone Encounter (Signed)
I sent in some Valium she can use

## 2021-04-10 ENCOUNTER — Ambulatory Visit (INDEPENDENT_AMBULATORY_CARE_PROVIDER_SITE_OTHER)
Admission: RE | Admit: 2021-04-10 | Discharge: 2021-04-10 | Disposition: A | Discharge status: Home / Self Care | Payer: BC Managed Care – PPO | Source: Ambulatory Visit | Attending: Family Medicine | Admitting: Family Medicine

## 2021-04-10 DIAGNOSIS — M25552 Pain in left hip: Secondary | ICD-10-CM

## 2021-04-10 DIAGNOSIS — G8929 Other chronic pain: Secondary | ICD-10-CM

## 2021-04-10 NOTE — Telephone Encounter (Signed)
Take it one hour prior to the test (have someone drive her there)

## 2021-04-13 NOTE — Addendum Note (Signed)
Addended by: Alysia Penna A on: 04/13/2021 08:02 AM   Modules accepted: Orders

## 2021-04-14 ENCOUNTER — Ambulatory Visit (HOSPITAL_BASED_OUTPATIENT_CLINIC_OR_DEPARTMENT_OTHER): Payer: BC Managed Care – PPO | Admitting: Physical Therapy

## 2021-04-17 ENCOUNTER — Encounter (HOSPITAL_BASED_OUTPATIENT_CLINIC_OR_DEPARTMENT_OTHER): Payer: Self-pay | Admitting: Physical Therapy

## 2021-04-17 ENCOUNTER — Other Ambulatory Visit: Payer: Self-pay

## 2021-04-17 ENCOUNTER — Ambulatory Visit (HOSPITAL_BASED_OUTPATIENT_CLINIC_OR_DEPARTMENT_OTHER): Payer: BC Managed Care – PPO | Admitting: Physical Therapy

## 2021-04-17 DIAGNOSIS — G8929 Other chronic pain: Secondary | ICD-10-CM

## 2021-04-17 DIAGNOSIS — R262 Difficulty in walking, not elsewhere classified: Secondary | ICD-10-CM | POA: Diagnosis not present

## 2021-04-17 DIAGNOSIS — M25562 Pain in left knee: Secondary | ICD-10-CM | POA: Diagnosis not present

## 2021-04-17 DIAGNOSIS — M6281 Muscle weakness (generalized): Secondary | ICD-10-CM | POA: Diagnosis not present

## 2021-04-17 NOTE — Therapy (Signed)
[facility] [address] [place], [place], [address] Phone: [phone]   Fax:  [phone]  Physical Therapy Treatment  Patient Details  Name: Alison Best MRN: [id number] Date of Birth: 08/12/1956 Referring Provider (PT): Fry, Stephen A, MD   Encounter Date: 04/17/2021   PT End of Session - 04/17/21 1350     Visit Number 17    Number of Visits 27    Date for PT Re-Evaluation 05/15/21    Authorization Type BCBS 30 VL    PT Start Time 0952    PT Stop Time 1030    PT Time Calculation (min) 38 min    Equipment Utilized During Treatment Other (comment)    Activity Tolerance Patient tolerated treatment well    Behavior During Therapy WFL for tasks assessed/performed             Past Medical History:  Diagnosis Date   Anal fissure    Arthritis    Bursitis    Constipation    COPD (chronic obstructive pulmonary disease) ([facility])    Diabetes mellitus without complication ([facility])    sees Dr. Gherghe    Diverticulitis of colon 02/2015   Ectopic pregnancy    Emphysema of lung ([facility])    Frozen shoulder    Heart murmur    Hernia, abdominal    Hyperlipidemia    Hyperthyroidism    Infertility, female    Irregular heartbeat    Joint pain    Obesity    Osteoarthritis    Sleep apnea    Swallowing difficulty    Thyroid disease    hyperthyroid    Past Surgical History:  Procedure Laterality Date   APPENDECTOMY     CESAREAN SECTION     COLONOSCOPY  09-10-10   per Dr. Sam Ganem, diverticulosis of descending colon and sigmoid, internal hemorrhoids, repeat in 10 yrs   COLOSTOMY N/A 02/20/2015   Procedure: COLOSTOMY;  Surgeon: Todd Rosenbower, MD;  [facility];  Service: General;  Laterality: N/A;   COLOSTOMY REVISION  02/20/2015   Procedure: Sigmoid Colectomy;  Surgeon: Todd Rosenbower, MD;  Location: [facility];  Service: General;;   CYSTOSCOPY N/A 02/20/2015   Procedure: CYSTOSCOPY;  Surgeon: Mark Ottelin, MD;   [facility];  Service: Urology;  Laterality: N/A;   DILATION AND CURETTAGE OF UTERUS     x3   ILEO LOOP COLOSTOMY CLOSURE N/A 09/26/2015   Procedure: LAPAROSCOPIC LYSIS OF ADHESIONS, COLOSTOMY CLOSURE;  Surgeon: Todd Rosenbower, MD;  [facility];  Service: General;  Laterality: N/A;   LAPAROTOMY N/A 02/20/2015   Procedure: Emergency EXPLORATORY and drainiage of interabdominal abcesses;  Surgeon: Todd Rosenbower, MD;  [facility];  Service: General;  Laterality: N/A;   SALPINGOOPHORECTOMY Left 02/20/2015   Procedure: SALPINGO OOPHORECTOMY;  Surgeon: Todd Rosenbower, MD;  [facility];  Service: General;  Laterality: Left;   TONSILLECTOMY  1980    There were no vitals filed for this visit.   Subjective Assessment - 04/17/21 1349     Subjective Xray of hip: OA.  I am a little achy    Currently in Pain? Yes    Pain Score 3     Pain Location Hip    Pain Orientation Left    Pain Descriptors / Indicators Aching    Pain Type Chronic pain    Pain Onset More than a month ago    Pain Frequency Intermittent                TREATMENT Pt seen for aquatic therapy today.  Treatment took place in water 3.25-4.8 ft in depth at the [facility] pool. Temp of water was 91.  Pt entered/exited the pool via stairs step to pattern independently with bilat rail.   Warm up: forward, backward and side stepping/walking cues for increased step length, increased speed, hand placement to increase resistance.   Standing Hip extension using noddle stretching flexor 3 sets of 20 second hold.  Noodle pull through hip flex strengthening 3 x 10 reps.  Cuing for tight core and erect posture.  Vertically suspended Stretching of LB knees to chest supported by PT and noodle 3 x 20 seconds pt providing overpressure.  Core strengthening knee pushdowns and jack knife 2 x 10 reps Core activation in seated position, cuing for abd tightness, kicking with ue extended 3 sets of 20 secs Noodle kick  downs supported by hand buoys 2 x 10 reps.       Pt requires buoyancy for support and to offload joints with strengthening exercises. Viscosity of the water is needed for resistance of strengthening; water current perturbations provides challenge to standing balance unsupported, requiring increased core activation                             PT Short Term Goals - 02/13/21 1151       PT SHORT TERM GOAL #1   Title pt will be independent with HEP as it has been established in the short term.    Status Achieved      PT SHORT TERM GOAL #2   Title pt will verbalize feeling some improvement in overall mobility through ADLs.    Status Achieved               PT Long Term Goals - 03/17/21 1314       PT LONG TERM GOAL #1   Title pt will be able to navigate stairs at her home with confidence and necessary strength for reciprocal pattern    Baseline alternating on 3 steps on front, main entrance is 5-6 steps and does a step-to pattern      PT LONG TERM GOAL #2   Title 5TSTS to improve by MDC of 4s    Baseline 13s today, community dwelling norm for age group is 11.4    Status Partially Met      PT LONG TERM GOAL #3   Title BERG to improve by MDC of 3 points    Baseline improved by 6 points, 48/56, will benefit from further balance improvement to reach functional level desired    Status Partially Met      PT LONG TERM GOAL #4   Title 6MWT to improve by MDC of 191 ft    Baseline 805 ft, age appropriate 1765 and will continue to work toward this    Status Partially Met                   Plan - 04/17/21 1351     Clinical Impression Statement Pt presents a few minutes late.  She reports some discomfor although has been cleaning out her mother and fathers home.  Pt completes session without discomfort. She is able to isolate and activate core ms with exercises well.    Personal Factors and Comorbidities Time since onset of  injury/illness/exacerbation;Fitness;Comorbidity 3+    Comorbidities obesity, OA, COPD, DM, emphysema    Examination-Activity Limitations Squat;Stairs;Stand;Carry;Locomotion Level;Sit      Examination-Participation Restrictions Occupation;Community Activity;Shop    Stability/Clinical Decision Making Evolving/Moderate complexity    Rehab Potential Good    PT Frequency 2x / week    PT Duration 8 weeks    PT Treatment/Interventions ADLs/Self Care Home Management;Aquatic Therapy;Cryotherapy;Electrical Stimulation;Moist Heat;Balance training;Therapeutic exercise;Therapeutic activities;Functional mobility training;Stair training;Gait training;Neuromuscular re-education;Patient/family education;Manual techniques;Taping;Dry needling;Passive range of motion    PT Next Visit Plan stair climbing ability    PT Home Exercise Plan [id number]    Consulted and Agree with Plan of Care Patient             Patient will benefit from skilled therapeutic intervention in order to improve the following deficits and impairments:  Decreased strength, Postural dysfunction, Difficulty walking, Decreased activity tolerance, Decreased mobility, Decreased balance, Pain, Decreased endurance  Visit Diagnosis: Chronic pain of left knee  Muscle weakness (generalized)  Difficulty in walking, not elsewhere classified     Problem List Patient Active Problem List   Diagnosis Date Noted   Psoriasis 01/15/2021   Chronic pain of left knee 01/15/2021   Ventral hernia without obstruction or gangrene 01/15/2021   Class 3 severe obesity with serious comorbidity and body mass index (BMI) of 40.0 to 44.9 in adult ([facility]) 01/15/2019   Acne rosacea 05/10/2016   Multinodular goiter 02/27/2016   Subclinical thyrotoxicosis 02/26/2016   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 09/26/15 09/26/2015   Prediabetes 07/30/2015   Perforation of sigmoid colon ([facility]) 02/20/2015   Diverticulitis of large intestine with abscess without  bleeding 02/18/2015   OSA (obstructive sleep apnea) 05/09/2013   GASTROENTERITIS 08/18/2010   RAYNAUD'S SYNDROME 02/17/2009   PLANTAR FASCIITIS 02/17/2009   Asthma 06/12/2008   Hyperlipidemia 09/04/2007   DEVIATED SEPTUM 09/04/2007   ALLERGIC RHINITIS 09/04/2007   GERD 09/04/2007   OSTEOARTHRITIS 09/04/2007   HEADACHE 09/04/2007    Mary F Ziemba MPT 04/17/2021, 1:57 PM  [facility] [address] , [place], [address] Phone: [phone]   Fax:  [phone]  Name: Alison Best MRN: [id number] Date of Birth: 05/03/1956    

## 2021-04-18 ENCOUNTER — Ambulatory Visit
Admission: RE | Admit: 2021-04-18 | Discharge: 2021-04-18 | Disposition: A | Discharge status: Home / Self Care | Payer: BC Managed Care – PPO | Source: Ambulatory Visit | Attending: Family Medicine | Admitting: Family Medicine

## 2021-04-18 DIAGNOSIS — K760 Fatty (change of) liver, not elsewhere classified: Secondary | ICD-10-CM | POA: Diagnosis not present

## 2021-04-18 DIAGNOSIS — R1900 Intra-abdominal and pelvic swelling, mass and lump, unspecified site: Secondary | ICD-10-CM

## 2021-04-18 DIAGNOSIS — R1904 Left lower quadrant abdominal swelling, mass and lump: Secondary | ICD-10-CM | POA: Diagnosis not present

## 2021-04-18 DIAGNOSIS — R1032 Left lower quadrant pain: Secondary | ICD-10-CM

## 2021-04-18 DIAGNOSIS — K8020 Calculus of gallbladder without cholecystitis without obstruction: Secondary | ICD-10-CM | POA: Diagnosis not present

## 2021-04-18 DIAGNOSIS — Z933 Colostomy status: Secondary | ICD-10-CM | POA: Diagnosis not present

## 2021-04-18 MED ORDER — GADOBENATE DIMEGLUMINE 529 MG/ML IV SOLN
20.0000 mL | Freq: Once | INTRAVENOUS | Status: AC | PRN
  Administered 2021-04-18: 20 mL via INTRAVENOUS

## 2021-04-21 ENCOUNTER — Encounter (HOSPITAL_BASED_OUTPATIENT_CLINIC_OR_DEPARTMENT_OTHER): Payer: Self-pay | Admitting: Physical Therapy

## 2021-04-21 ENCOUNTER — Other Ambulatory Visit: Payer: Self-pay

## 2021-04-21 ENCOUNTER — Ambulatory Visit (HOSPITAL_BASED_OUTPATIENT_CLINIC_OR_DEPARTMENT_OTHER): Payer: BC Managed Care – PPO | Admitting: Physical Therapy

## 2021-04-21 DIAGNOSIS — G8929 Other chronic pain: Secondary | ICD-10-CM

## 2021-04-21 DIAGNOSIS — M25562 Pain in left knee: Secondary | ICD-10-CM | POA: Diagnosis not present

## 2021-04-21 DIAGNOSIS — R262 Difficulty in walking, not elsewhere classified: Secondary | ICD-10-CM | POA: Diagnosis not present

## 2021-04-21 DIAGNOSIS — M6281 Muscle weakness (generalized): Secondary | ICD-10-CM | POA: Diagnosis not present

## 2021-04-21 NOTE — Addendum Note (Signed)
Addended by: Alysia Penna A on: 04/21/2021 07:36 AM   Modules accepted: Orders

## 2021-04-21 NOTE — Therapy (Addendum)
[facility] [address] [place], [place], [address] Phone: [phone]   Fax:  [phone]  Physical Therapy Treatment  Patient Details  Name: [facility] MRN: [id number] Date of Birth: 1956-08-06 Referring Provider (PT): Laurey Morale, MD   Encounter Date: 04/21/2021   PT End of Session - 04/21/21 1115     Visit Number 18    Number of Visits 27    Date for PT Re-Evaluation 05/15/21    Authorization Type BCBS 30 VL    PT Start Time 1100   non billed time from 1110-1126   PT Stop Time 1203    PT Time Calculation (min) 63 min    Activity Tolerance Patient tolerated treatment well    Behavior During Therapy [facility] for tasks assessed/performed             Past Medical History:  Diagnosis Date   Anal fissure    Arthritis    Bursitis    Constipation    COPD (chronic obstructive pulmonary disease) ([place])    Diabetes mellitus without complication ([place])    sees Dr. Cruzita Lederer    Diverticulitis of colon 02/2015   Ectopic pregnancy    Emphysema of lung ([place])    Frozen shoulder    Heart murmur    Hernia, abdominal    Hyperlipidemia    Hyperthyroidism    Infertility, female    Irregular heartbeat    Joint pain    Obesity    Osteoarthritis    Sleep apnea    Swallowing difficulty    Thyroid disease    hyperthyroid    Past Surgical History:  Procedure Laterality Date   APPENDECTOMY     CESAREAN SECTION     COLONOSCOPY  09-10-10   per Dr. Acquanetta Sit, diverticulosis of descending colon and sigmoid, internal hemorrhoids, repeat in 10 yrs   COLOSTOMY N/A 02/20/2015   Procedure: COLOSTOMY;  Surgeon: Jackolyn Confer, MD;  [facility];  Service: General;  Laterality: N/A;   COLOSTOMY REVISION  02/20/2015   Procedure: Sigmoid Colectomy;  Surgeon: Jackolyn Confer, MD;  [facility];  Service: General;;   CYSTOSCOPY N/A 02/20/2015   Procedure: Consuela Mimes;  Surgeon: Kathie Rhodes, MD;  [facility];   Service: Urology;  Laterality: N/A;   DILATION AND CURETTAGE OF UTERUS     x3   ILEO LOOP COLOSTOMY CLOSURE N/A 09/26/2015   Procedure: LAPAROSCOPIC LYSIS OF ADHESIONS, COLOSTOMY CLOSURE;  Surgeon: Jackolyn Confer, MD;  [facility];  Service: General;  Laterality: N/A;   LAPAROTOMY N/A 02/20/2015   Procedure: Emergency EXPLORATORY and drainiage of interabdominal abcesses;  Surgeon: Jackolyn Confer, MD;  [facility];  Service: General;  Laterality: N/A;   SALPINGOOPHORECTOMY Left 02/20/2015   Procedure: SALPINGO OOPHORECTOMY;  Surgeon: Jackolyn Confer, MD;  [facility];  Service: General;  Laterality: Left;   TONSILLECTOMY  1980    There were no vitals filed for this visit.   Subjective Assessment - 04/21/21 1129     Subjective MRI showed hernia and hip xray showed severe degenerative changes.    How long can you walk comfortably? car to PT clinic is no longer an issue; was able to walk museum with rollator, can do an hour of walking around with little rest breaks in rollator    Currently in Pain? No/denies               PT entered & exited pool via stairs using bil hand rails,  depth up to 4'8", water temp 88-90 deg  aquatic exercises: Core horiz abd/add with water dumbbells Core above paired with fwd and lateral step ups power walking with long strides Scissor jumps and abd/add hops noodle under arms Sat on multicolor float for hip decompression                     PT Education - 04/21/21 1220     Education Details MRI & xray, POC    Person(s) Educated Patient    Methods Explanation    Comprehension Verbalized understanding;Need further instruction              PT Short Term Goals - 02/13/21 1151       PT SHORT TERM GOAL #1   Title pt will be independent with HEP as it has been established in the short term.    Status Achieved      PT SHORT TERM GOAL #2   Title pt will verbalize feeling some improvement in overall mobility  through ADLs.    Status Achieved               PT Long Term Goals - 03/17/21 1314       PT LONG TERM GOAL #1   Title pt will be able to navigate stairs at her home with confidence and necessary strength for reciprocal pattern    Baseline alternating on 3 steps on front, main entrance is 5-6 steps and does a step-to pattern      PT LONG TERM GOAL #2   Title 5TSTS to improve by MDC of 4s    Baseline 13s today, community dwelling norm for age group is 11.4    Status Partially Met      PT LONG TERM GOAL #3   Title BERG to improve by [place] of 3 points    Baseline improved by 6 points, 48/56, will benefit from further balance improvement to reach functional level desired    Status Partially Met      PT LONG TERM GOAL #4   Title 6MWT to improve by [place] of 191 ft    Baseline 27 ft, age appropriate 1765 and will continue to work toward this    Status Partially Met                   Plan - 04/21/21 1221     Clinical Impression Statement Pt arrived on time for her appointment and PT had to leave the room so time was labeled as non-billed. Xray showing severe degenerative changes in hip but also able to see hernia in abdomen that would need to be treated in order to be successful with a THA. Trialed some jumping in the pool as she would like to try the water fit class, could feel it so we added a noddle for additional suspension and decrease pounding. added long strides with powerful walking in the water for step length. stairs were well tolerated in the water and will re-evaluate her tolerance to steps at her next land visit.    PT Treatment/Interventions ADLs/Self Care Home Management;Aquatic Therapy;Cryotherapy;Electrical Stimulation;Moist Heat;Balance training;Therapeutic exercise;Therapeutic activities;Functional mobility training;Stair training;Gait training;Neuromuscular re-education;Patient/family education;Manual techniques;Taping;Dry needling;Passive range of motion    PT  Next Visit Plan next land: step/stairs tolerance & re-eval;  pool: continue stairs for strengthening, larger strides in the water and endurance    PT Home Exercise Plan [facility]    Consulted and Agree with Plan of Care Patient  Patient will benefit from skilled therapeutic intervention in order to improve the following deficits and impairments:  Decreased strength, Postural dysfunction, Difficulty walking, Decreased activity tolerance, Decreased mobility, Decreased balance, Pain, Decreased endurance  Visit Diagnosis: Chronic pain of left knee  Muscle weakness (generalized)  Difficulty in walking, not elsewhere classified     Problem List Patient Active Problem List   Diagnosis Date Noted   Psoriasis 01/15/2021   Chronic pain of left knee 01/15/2021   Ventral hernia without obstruction or gangrene 01/15/2021   Class 3 severe obesity with serious comorbidity and body mass index (BMI) of 40.0 to 44.9 in adult [facility]) 01/15/2019   Acne rosacea 05/10/2016   Multinodular goiter 02/27/2016   Subclinical thyrotoxicosis 02/26/2016   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 Hightower PT, DPT 04/21/21 12:30 PM   [facility] [address] [place], [place], [address] Phone: [phone]   Fax:  [phone]  Name: Alison Best [facility] MRN: [id number] Date of Birth: 09/13/1956

## 2021-04-22 ENCOUNTER — Ambulatory Visit (INDEPENDENT_AMBULATORY_CARE_PROVIDER_SITE_OTHER): Payer: BC Managed Care – PPO | Admitting: Professional

## 2021-04-22 DIAGNOSIS — F4321 Adjustment disorder with depressed mood: Secondary | ICD-10-CM | POA: Diagnosis not present

## 2021-04-23 ENCOUNTER — Ambulatory Visit (HOSPITAL_BASED_OUTPATIENT_CLINIC_OR_DEPARTMENT_OTHER): Payer: BC Managed Care – PPO | Admitting: Physical Therapy

## 2021-04-23 ENCOUNTER — Encounter (HOSPITAL_BASED_OUTPATIENT_CLINIC_OR_DEPARTMENT_OTHER): Payer: Self-pay | Admitting: Physical Therapy

## 2021-04-23 ENCOUNTER — Other Ambulatory Visit: Payer: Self-pay

## 2021-04-23 DIAGNOSIS — M6281 Muscle weakness (generalized): Secondary | ICD-10-CM

## 2021-04-23 DIAGNOSIS — R262 Difficulty in walking, not elsewhere classified: Secondary | ICD-10-CM | POA: Diagnosis not present

## 2021-04-23 DIAGNOSIS — M25562 Pain in left knee: Secondary | ICD-10-CM

## 2021-04-23 DIAGNOSIS — G8929 Other chronic pain: Secondary | ICD-10-CM

## 2021-04-23 NOTE — Therapy (Signed)
[facility] [address] [place], [place], [address] Phone: [phone]   Fax:  [phone]  Physical Therapy Treatment  Patient Details  Name: Alison Best MRN: [id number] Date of Birth: 08/21/1956 Referring Provider (PT): Laurey Morale, MD   Encounter Date: 04/23/2021   PT End of Session - 04/23/21 1539     Visit Number 19    Number of Visits 27    Date for PT Re-Evaluation 05/15/21    Authorization Type BCBS 30 VL    PT Start Time 1445    PT Stop Time 1530    PT Time Calculation (min) 45 min    Equipment Utilized During Treatment Other (comment)    Activity Tolerance Patient tolerated treatment well    Behavior During Therapy [facility] for tasks assessed/performed             Past Medical History:  Diagnosis Date   Anal fissure    Arthritis    Bursitis    Constipation    COPD (chronic obstructive pulmonary disease) ([place])    Diabetes mellitus without complication ([place])    sees Dr. Cruzita Lederer    Diverticulitis of colon 02/2015   Ectopic pregnancy    Emphysema of lung ([place])    Frozen shoulder    Heart murmur    Hernia, abdominal    Hyperlipidemia    Hyperthyroidism    Infertility, female    Irregular heartbeat    Joint pain    Obesity    Osteoarthritis    Sleep apnea    Swallowing difficulty    Thyroid disease    hyperthyroid    Past Surgical History:  Procedure Laterality Date   APPENDECTOMY     CESAREAN SECTION     COLONOSCOPY  09-10-10   per Dr. Acquanetta Sit, diverticulosis of descending colon and sigmoid, internal hemorrhoids, repeat in 10 yrs   COLOSTOMY N/A 02/20/2015   Procedure: COLOSTOMY;  Surgeon: Jackolyn Confer, MD;  [facility];  Service: General;  Laterality: N/A;   COLOSTOMY REVISION  02/20/2015   Procedure: Sigmoid Colectomy;  Surgeon: Jackolyn Confer, MD;  [facility];  Service: General;;   CYSTOSCOPY N/A 02/20/2015   Procedure: Consuela Mimes;  Surgeon: Kathie Rhodes, MD;   [facility];  Service: Urology;  Laterality: N/A;   DILATION AND CURETTAGE OF UTERUS     x3   ILEO LOOP COLOSTOMY CLOSURE N/A 09/26/2015   Procedure: LAPAROSCOPIC LYSIS OF ADHESIONS, COLOSTOMY CLOSURE;  Surgeon: Jackolyn Confer, MD;  [facility];  Service: General;  Laterality: N/A;   LAPAROTOMY N/A 02/20/2015   Procedure: Emergency EXPLORATORY and drainiage of interabdominal abcesses;  Surgeon: Jackolyn Confer, MD;  [facility];  Service: General;  Laterality: N/A;   SALPINGOOPHORECTOMY Left 02/20/2015   Procedure: SALPINGO OOPHORECTOMY;  Surgeon: Jackolyn Confer, MD;  [facility];  Service: General;  Laterality: Left;   TONSILLECTOMY  1980    There were no vitals filed for this visit.   Subjective Assessment - 04/23/21 1722     Subjective Have a refrral for my surgeon for the hernia and an appointment with Dr Alvan Dame for my hip              TREATMENT Pt seen for aquatic therapy today.  Treatment took place in water 3.25-4.8 ft in depth at the [facility] pool. Temp of water was 91.  Pt entered/exited the pool via stairs step to pattern independently with bilat rail.   Warm up: forward,  backward and side stepping/walking cues for increased step length, increased speed, hand placement to increase resistance. Added pushing kick board x 4 widths   Standing Noodle kick downs with ankle cuffs supported by hand buoys 2 x 10 reps hip flex and external rotation Kick board pushdowns 3 x 10 reps with manual perturbation as per therapist   Vertically suspended Stretching of LB knees to chest supported by PT and noodle 3 x 20 seconds PT providing overpressure.  Core strengthening knee pushdowns and jack knife 2 x 10 reps Suspended by squoodle 2 lengths of pool bicycling. Core strengthening and balance challenge on squoodle holding feet flat together maintaining position for 30 sec trials x 30        Pt requires buoyancy for support and to offload joints with  strengthening exercises. Viscosity of the water is needed for resistance of strengthening; water current perturbations provides challenge to standing balance unsupported, requiring increased core activation                            PT Short Term Goals - 02/13/21 1151       PT SHORT TERM GOAL #1   Title pt will be independent with HEP as it has been established in the short term.    Status Achieved      PT SHORT TERM GOAL #2   Title pt will verbalize feeling some improvement in overall mobility through ADLs.    Status Achieved               PT Long Term Goals - 03/17/21 1314       PT LONG TERM GOAL #1   Title pt will be able to navigate stairs at her home with confidence and necessary strength for reciprocal pattern    Baseline alternating on 3 steps on front, main entrance is 5-6 steps and does a step-to pattern      PT LONG TERM GOAL #2   Title 5TSTS to improve by MDC of 4s    Baseline 13s today, community dwelling norm for age group is 11.4    Status Partially Met      PT LONG TERM GOAL #3   Title BERG to improve by [place] of 3 points    Baseline improved by 6 points, 48/56, will benefit from further balance improvement to reach functional level desired    Status Partially Met      PT LONG TERM GOAL #4   Title 6MWT to improve by [place] of 191 ft    Baseline 46 ft, age appropriate 87 and will continue to work toward this    Status Partially Met                   Plan - 04/23/21 1736     Clinical Impression Statement Pt relieved of new dx.  Ready to have them taken care of.  Continued with the long strides and powerful walking adding kick board for added ressistance.  Pt with good demonstration of core strength with improved completion of jack knife.    Personal Factors and Comorbidities Time since onset of injury/illness/exacerbation;Fitness;Comorbidity 3+    Comorbidities obesity, OA, COPD, DM, emphysema    Examination-Activity  Limitations Squat;Stairs;Stand;Carry;Locomotion Level;Sit    Examination-Participation Restrictions Occupation;Community Activity;Shop    PT Treatment/Interventions ADLs/Self Care Home Management;Aquatic Therapy;Cryotherapy;Electrical Stimulation;Moist Heat;Balance training;Therapeutic exercise;Therapeutic activities;Functional mobility training;Stair training;Gait training;Neuromuscular re-education;Patient/family education;Manual techniques;Taping;Dry needling;Passive range of motion    PT  Home Exercise Plan [facility]             Patient will benefit from skilled therapeutic intervention in order to improve the following deficits and impairments:  Decreased strength, Postural dysfunction, Difficulty walking, Decreased activity tolerance, Decreased mobility, Decreased balance, Pain, Decreased endurance  Visit Diagnosis: Chronic pain of left knee  Muscle weakness (generalized)  Difficulty in walking, not elsewhere classified     Problem List Patient Active Problem List   Diagnosis Date Noted   Psoriasis 01/15/2021   Chronic pain of left knee 01/15/2021   Ventral hernia without obstruction or gangrene 01/15/2021   Class 3 severe obesity with serious comorbidity and body mass index (BMI) of 40.0 to 44.9 in adult [facility]) 01/15/2019   Acne rosacea 05/10/2016   Multinodular goiter 02/27/2016   Subclinical thyrotoxicosis 02/26/2016   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 09/26/15 09/26/2015   Prediabetes 07/30/2015   Perforation of sigmoid colon ([place]) 02/20/2015   Diverticulitis of large intestine with abscess without bleeding 02/18/2015   OSA (obstructive sleep apnea) 05/09/2013   GASTROENTERITIS 08/18/2010   RAYNAUD'S SYNDROME 02/17/2009   PLANTAR FASCIITIS 02/17/2009   Asthma 06/12/2008   Hyperlipidemia 09/04/2007   DEVIATED SEPTUM 09/04/2007   ALLERGIC RHINITIS 09/04/2007   GERD 09/04/2007   OSTEOARTHRITIS 09/04/2007   HEADACHE 09/04/2007    Vedia Pereyra  MPT 04/23/2021, 5:39 PM  [facility] [address] [place], [place], [address] Phone: [phone]   Fax:  [phone]  Name: Alison Best MRN: [id number] Date of Birth: 12/21/1955

## 2021-04-24 ENCOUNTER — Ambulatory Visit (HOSPITAL_BASED_OUTPATIENT_CLINIC_OR_DEPARTMENT_OTHER): Payer: BC Managed Care – PPO | Admitting: Physical Therapy

## 2021-04-28 ENCOUNTER — Other Ambulatory Visit: Payer: Self-pay

## 2021-04-28 ENCOUNTER — Ambulatory Visit (HOSPITAL_BASED_OUTPATIENT_CLINIC_OR_DEPARTMENT_OTHER): Payer: BC Managed Care – PPO | Attending: Family Medicine | Admitting: Physical Therapy

## 2021-04-28 ENCOUNTER — Encounter (HOSPITAL_BASED_OUTPATIENT_CLINIC_OR_DEPARTMENT_OTHER): Payer: Self-pay | Admitting: Physical Therapy

## 2021-04-28 DIAGNOSIS — G8929 Other chronic pain: Secondary | ICD-10-CM | POA: Diagnosis not present

## 2021-04-28 DIAGNOSIS — R262 Difficulty in walking, not elsewhere classified: Secondary | ICD-10-CM | POA: Diagnosis not present

## 2021-04-28 DIAGNOSIS — M6281 Muscle weakness (generalized): Secondary | ICD-10-CM | POA: Diagnosis not present

## 2021-04-28 DIAGNOSIS — M25562 Pain in left knee: Secondary | ICD-10-CM | POA: Insufficient documentation

## 2021-04-28 NOTE — Therapy (Signed)
[facility] [address] [place], [place], [address] Phone: [phone]   Fax:  [phone]  Physical Therapy Treatment  Patient Details  Name: Alison Best [facility] MRN: [id number] Date of Birth: 01-28-1956 Referring Provider (PT): Laurey Morale, MD   Encounter Date: 04/28/2021   PT End of Session - 04/28/21 1515     Visit Number 20    Number of Visits 27    Date for PT Re-Evaluation 05/15/21    Authorization Type BCBS 30 VL    PT Start Time 1445    PT Stop Time 6789    PT Time Calculation (min) 45 min    Equipment Utilized During Treatment Other (comment)    Activity Tolerance Patient tolerated treatment well             Past Medical History:  Diagnosis Date   Anal fissure    Arthritis    Bursitis    Constipation    COPD (chronic obstructive pulmonary disease) ([place])    Diabetes mellitus without complication ([place])    sees Dr. Cruzita Lederer    Diverticulitis of colon 02/2015   Ectopic pregnancy    Emphysema of lung ([place])    Frozen shoulder    Heart murmur    Hernia, abdominal    Hyperlipidemia    Hyperthyroidism    Infertility, female    Irregular heartbeat    Joint pain    Obesity    Osteoarthritis    Sleep apnea    Swallowing difficulty    Thyroid disease    hyperthyroid    Past Surgical History:  Procedure Laterality Date   APPENDECTOMY     CESAREAN SECTION     COLONOSCOPY  09-10-10   per Dr. Acquanetta Sit, diverticulosis of descending colon and sigmoid, internal hemorrhoids, repeat in 10 yrs   COLOSTOMY N/A 02/20/2015   Procedure: COLOSTOMY;  Surgeon: Jackolyn Confer, MD;  [facility];  Service: General;  Laterality: N/A;   COLOSTOMY REVISION  02/20/2015   Procedure: Sigmoid Colectomy;  Surgeon: Jackolyn Confer, MD;  [facility];  Service: General;;   CYSTOSCOPY N/A 02/20/2015   Procedure: Consuela Mimes;  Surgeon: Kathie Rhodes, MD;  [facility];  Service: Urology;  Laterality: N/A;    DILATION AND CURETTAGE OF UTERUS     x3   ILEO LOOP COLOSTOMY CLOSURE N/A 09/26/2015   Procedure: LAPAROSCOPIC LYSIS OF ADHESIONS, COLOSTOMY CLOSURE;  Surgeon: Jackolyn Confer, MD;  [facility];  Service: General;  Laterality: N/A;   LAPAROTOMY N/A 02/20/2015   Procedure: Emergency EXPLORATORY and drainiage of interabdominal abcesses;  Surgeon: Jackolyn Confer, MD;  [facility];  Service: General;  Laterality: N/A;   SALPINGOOPHORECTOMY Left 02/20/2015   Procedure: SALPINGO OOPHORECTOMY;  Surgeon: Jackolyn Confer, MD;  [facility];  Service: General;  Laterality: Left;   TONSILLECTOMY  1980    There were no vitals filed for this visit.   Subjective Assessment - 04/28/21 1451     Subjective my right flank area in the back hurt for a few days after last session.  Didn't really do anything all weekend, no exercising.    Pain Score 5     Pain Location Hip    Pain Orientation Left    Pain Descriptors / Indicators Aching    Pain Type Chronic pain    Pain Onset More than a month ago    Aggravating Factors  moving  TREATMENT Pt seen for aquatic therapy today.  Treatment took place in water 3.25-4.8 ft in depth at the [facility] pool. Temp of water was 91.  Pt entered/exited the pool via stairs step to pattern independently with bilat rail.   Warm up: forward, backward and side stepping/walking cues for increased step length, increased speed, hand placement to increase resistance. Pushing kick board x 4 widths   Standing Noodle kick downs with ankle cuffs supported by hand buoys 2 x 10 reps hip flex and external rotation Hip adduction/abduction 2 x 10 reps.  Hip circles 2 x 10 reps   Vertically suspended Stretching of LB knees to chest supported by PT and noodle 3 x 20 seconds PT providing overpressure.  Core strengthening knee pushdowns and jack knife  x 10 reps  Supine suspension Stretching and strengthening of lat core muscle facilitate and  directed by PT x 10 mins PPT 2 x 10 reps Modified squats: PT stabilizing feet and pt kicking away then pulling back x 10 reps.       Pt requires buoyancy for support and to offload joints with strengthening exercises. Viscosity of the water is needed for resistance of strengthening; water current perturbations provides challenge to standing balance unsupported, requiring increased core activation                           PT Short Term Goals - 02/13/21 1151       PT SHORT TERM GOAL #1   Title pt will be independent with HEP as it has been established in the short term.    Status Achieved      PT SHORT TERM GOAL #2   Title pt will verbalize feeling some improvement in overall mobility through ADLs.    Status Achieved               PT Long Term Goals - 03/17/21 1314       PT LONG TERM GOAL #1   Title pt will be able to navigate stairs at her home with confidence and necessary strength for reciprocal pattern    Baseline alternating on 3 steps on front, main entrance is 5-6 steps and does a step-to pattern      PT LONG TERM GOAL #2   Title 5TSTS to improve by MDC of 4s    Baseline 13s today, community dwelling norm for age group is 11.4    Status Partially Met      PT LONG TERM GOAL #3   Title BERG to improve by [place] of 3 points    Baseline improved by 6 points, 48/56, will benefit from further balance improvement to reach functional level desired    Status Partially Met      PT LONG TERM GOAL #4   Title 6MWT to improve by [place] of 191 ft    Baseline 75 ft, age appropriate 1765 and will continue to work toward this    Status Partially Met                   Plan - 04/28/21 1814     Clinical Impression Statement Slight uptick in discomfort  tht has eased as of today.  Was able to elicit  in trap/rhomboid area with deep palpation while pt suspended in sup.  Deep pressure/massage competed. Pt with reduced episode of LOB with core strengthening  and balance challenges.    Personal Factors and Comorbidities Time since onset  of injury/illness/exacerbation;Fitness;Comorbidity 3+    Comorbidities obesity, OA, COPD, DM, emphysema    Examination-Activity Limitations Squat;Stairs;Stand;Carry;Locomotion Level;Sit    Stability/Clinical Decision Making Evolving/Moderate complexity    Rehab Potential Good    PT Frequency 2x / week    PT Duration 8 weeks    PT Treatment/Interventions ADLs/Self Care Home Management;Aquatic Therapy;Cryotherapy;Electrical Stimulation;Moist Heat;Balance training;Therapeutic exercise;Therapeutic activities;Functional mobility training;Stair training;Gait training;Neuromuscular re-education;Patient/family education;Manual techniques;Taping;Dry needling;Passive range of motion    PT Next Visit Plan Check on trap/rhombid disocmfort    PT Home Exercise Plan [facility]    Consulted and Agree with Plan of Care Patient             Patient will benefit from skilled therapeutic intervention in order to improve the following deficits and impairments:  Decreased strength, Postural dysfunction, Difficulty walking, Decreased activity tolerance, Decreased mobility, Decreased balance, Pain, Decreased endurance  Visit Diagnosis: Chronic pain of left knee  Muscle weakness (generalized)  Difficulty in walking, not elsewhere classified     Problem List Patient Active Problem List   Diagnosis Date Noted   Psoriasis 01/15/2021   Chronic pain of left knee 01/15/2021   Ventral hernia without obstruction or gangrene 01/15/2021   Class 3 severe obesity with serious comorbidity and body mass index (BMI) of 40.0 to 44.9 in adult [facility]) 01/15/2019   Acne rosacea 05/10/2016   Multinodular goiter 02/27/2016   Subclinical thyrotoxicosis 02/26/2016   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 [place], [place], [address] Phone: [phone]   Fax:  [phone]  Name: Laia Wiley [facility] MRN: [id number] Date of Birth: 01-15-1956

## 2021-04-30 ENCOUNTER — Encounter (HOSPITAL_BASED_OUTPATIENT_CLINIC_OR_DEPARTMENT_OTHER): Payer: Self-pay | Admitting: Physical Therapy

## 2021-04-30 ENCOUNTER — Other Ambulatory Visit: Payer: Self-pay

## 2021-04-30 ENCOUNTER — Ambulatory Visit (HOSPITAL_BASED_OUTPATIENT_CLINIC_OR_DEPARTMENT_OTHER): Payer: BC Managed Care – PPO | Admitting: Physical Therapy

## 2021-04-30 DIAGNOSIS — R262 Difficulty in walking, not elsewhere classified: Secondary | ICD-10-CM | POA: Diagnosis not present

## 2021-04-30 DIAGNOSIS — M6281 Muscle weakness (generalized): Secondary | ICD-10-CM

## 2021-04-30 DIAGNOSIS — G8929 Other chronic pain: Secondary | ICD-10-CM

## 2021-04-30 DIAGNOSIS — M25562 Pain in left knee: Secondary | ICD-10-CM | POA: Diagnosis not present

## 2021-04-30 NOTE — Therapy (Signed)
[facility] [address] [place], [place], [address] Phone: [phone]   Fax:  [phone]  Physical Therapy Treatment  Patient Details  Name: Alison Best [facility] MRN: [id number] Date of Birth: [birth year] Referring Provider (PT): Laurey Morale, MD   Encounter Date: 04/30/2021   PT End of Session - 04/30/21 1623     Visit Number 21    Date for PT Re-Evaluation 05/15/21    PT Start Time 1620    PT Stop Time 1705    PT Time Calculation (min) 45 min    Equipment Utilized During Treatment Other (comment)    Activity Tolerance Patient tolerated treatment well    Behavior During Therapy [facility] for tasks assessed/performed             Past Medical History:  Diagnosis Date   Anal fissure    Arthritis    Bursitis    Constipation    COPD (chronic obstructive pulmonary disease) ([place])    Diabetes mellitus without complication ([place])    sees Dr. Cruzita Lederer    Diverticulitis of colon 02/2015   Ectopic pregnancy    Emphysema of lung ([place])    Frozen shoulder    Heart murmur    Hernia, abdominal    Hyperlipidemia    Hyperthyroidism    Infertility, female    Irregular heartbeat    Joint pain    Obesity    Osteoarthritis    Sleep apnea    Swallowing difficulty    Thyroid disease    hyperthyroid    Past Surgical History:  Procedure Laterality Date   APPENDECTOMY     CESAREAN SECTION     COLONOSCOPY  09-10-10   per Dr. Acquanetta Sit, diverticulosis of descending colon and sigmoid, internal hemorrhoids, repeat in 10 yrs   COLOSTOMY N/A 02/20/2015   Procedure: COLOSTOMY;  Surgeon: Jackolyn Confer, MD;  [facility];  Service: General;  Laterality: N/A;   COLOSTOMY REVISION  02/20/2015   Procedure: Sigmoid Colectomy;  Surgeon: Jackolyn Confer, MD;  [facility];  Service: General;;   CYSTOSCOPY N/A 02/20/2015   Procedure: Consuela Mimes;  Surgeon: Kathie Rhodes, MD;  [facility];  Service: Urology;  Laterality: N/A;    DILATION AND CURETTAGE OF UTERUS     x3   ILEO LOOP COLOSTOMY CLOSURE N/A 09/26/2015   Procedure: LAPAROSCOPIC LYSIS OF ADHESIONS, COLOSTOMY CLOSURE;  Surgeon: Jackolyn Confer, MD;  [facility];  Service: General;  Laterality: N/A;   LAPAROTOMY N/A 02/20/2015   Procedure: Emergency EXPLORATORY and drainiage of interabdominal abcesses;  Surgeon: Jackolyn Confer, MD;  [facility];  Service: General;  Laterality: N/A;   SALPINGOOPHORECTOMY Left 02/20/2015   Procedure: SALPINGO OOPHORECTOMY;  Surgeon: Jackolyn Confer, MD;  [facility];  Service: General;  Laterality: Left;   TONSILLECTOMY  1980  Subjective "I felt all the stretching the day after my last session but it was good  There were no vitals filed for this visit.  TREATMENT Pt seen for aquatic therapy today.  Treatment took place in water 3.25-4.8 ft in depth at the [employer] pool. Temp of water was 91.  Pt entered/exited the pool via stairs step to pattern independently with bilat rail.   Warm up Bicycling straddling squoodle x 10 mins   Standing Noodle kick downs supported by hand buoys 2 x 10 reps hip flex and external rotation Hip adduction/abduction 2 x 10 reps.  Hip circles 2 x 10 reps hip flex stretching with noodle  bilat 3 x 30 sec hold, then full hip and trunk extension x 30 sec hold. Hip flex strengtheing noodle pull through to wall x 10 reps bilat   Vertically suspended Stretching of LB knees to chest supported by PT and noodle 3 x 20 seconds PT providing overpressure.  Core strengthening knee pushdowns and jack knife  x 10 reps   Supine suspension Stretching and strengthening of lat core muscle facilitated and directed by PT x 10 mins       Pt requires buoyancy for support and to offload joints with strengthening exercises. Viscosity of the water is needed for resistance of strengthening; water current perturbations provides challenge to standing balance unsupported, requiring increased core  activation  Subjective Assessment - 04/30/21 1824     Subjective "I felt all the stretching the day after my last session but it was good"    How long can you walk comfortably? car to PT clinic is no longer an issue; was able to walk museum with rollator, can do an hour of walking around with little rest breaks in rollator    Patient Stated Goals Sept 1st going on a trip to [place].get moving again & feeling better. improve balance, stairs    Currently in Pain? Yes    Pain Score 5                                          PT Short Term Goals - 02/13/21 1151       PT SHORT TERM GOAL #1   Title pt will be independent with HEP as it has been established in the short term.    Status Achieved      PT SHORT TERM GOAL #2   Title pt will verbalize feeling some improvement in overall mobility through ADLs.    Status Achieved               PT Long Term Goals - 04/30/21 1826       PT LONG TERM GOAL #1   Status On-going                   Plan - 04/30/21 1827     Clinical Impression Statement Directed pt through session stretching, strengthening and completing balance challenges.  No complaints of mid back or shoulder discomfort, eased with last  aquatic session. Pt improved in aerobic capacity bicylcing for 10 continuous minutes for warm up.    Personal Factors and Comorbidities Time since onset of injury/illness/exacerbation;Fitness;Comorbidity 3+    Examination-Activity Limitations Squat;Stairs;Stand;Carry;Locomotion Level;Sit    Stability/Clinical Decision Making Evolving/Moderate complexity    Rehab Potential Good    PT Frequency 2x / week    PT Duration 8 weeks    PT Treatment/Interventions ADLs/Self Care Home Management;Aquatic Therapy;Cryotherapy;Electrical Stimulation;Moist Heat;Balance training;Therapeutic exercise;Therapeutic activities;Functional mobility training;Stair training;Gait training;Neuromuscular  re-education;Patient/family education;Manual techniques;Taping;Dry needling;Passive range of motion    PT Home Exercise Plan [facility]             Patient will benefit from skilled therapeutic intervention in order to improve the following deficits and impairments:  Decreased strength, Postural dysfunction, Difficulty walking, Decreased activity tolerance, Decreased mobility, Decreased balance, Pain, Decreased endurance  Visit Diagnosis: Chronic pain of left knee  Muscle weakness (generalized)  Difficulty in walking, not elsewhere classified     Problem List Patient Active Problem List   Diagnosis Date Noted  Psoriasis 01/15/2021   Chronic pain of left knee 01/15/2021   Ventral hernia without obstruction or gangrene 01/15/2021   Class 3 severe obesity with serious comorbidity and body mass index (BMI) of 40.0 to 44.9 in adult [facility]) 01/15/2019   Acne rosacea 05/10/2016   Multinodular goiter 02/27/2016   Subclinical thyrotoxicosis 02/26/2016   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 09/26/15 09/26/2015   Prediabetes 07/30/2015   Perforation of sigmoid colon ([place]) 02/20/2015   Diverticulitis of large intestine with abscess without bleeding 02/18/2015   OSA (obstructive sleep apnea) 05/09/2013   GASTROENTERITIS 08/18/2010   RAYNAUD'S SYNDROME 02/17/2009   PLANTAR FASCIITIS 02/17/2009   Asthma 06/12/2008   Hyperlipidemia 09/04/2007   DEVIATED SEPTUM 09/04/2007   ALLERGIC RHINITIS 09/04/2007   GERD 09/04/2007   OSTEOARTHRITIS 09/04/2007   HEADACHE 09/04/2007    Macky Lower Jakub Debold MPT 04/30/2021, 6:46 PM  [facility] [place], [place], [address] Phone: [phone]   Fax:  [phone]  Name: Alison Best [facility] MRN: [id number] Date of Birth: 06/21/1956

## 2021-05-01 ENCOUNTER — Ambulatory Visit (HOSPITAL_BASED_OUTPATIENT_CLINIC_OR_DEPARTMENT_OTHER): Payer: Self-pay | Admitting: Physical Therapy

## 2021-05-04 ENCOUNTER — Encounter: Payer: Self-pay | Admitting: Internal Medicine

## 2021-05-04 ENCOUNTER — Ambulatory Visit (HOSPITAL_BASED_OUTPATIENT_CLINIC_OR_DEPARTMENT_OTHER): Payer: BC Managed Care – PPO | Admitting: Physical Therapy

## 2021-05-04 DIAGNOSIS — H2513 Age-related nuclear cataract, bilateral: Secondary | ICD-10-CM | POA: Diagnosis not present

## 2021-05-04 DIAGNOSIS — E119 Type 2 diabetes mellitus without complications: Secondary | ICD-10-CM | POA: Diagnosis not present

## 2021-05-04 DIAGNOSIS — H5213 Myopia, bilateral: Secondary | ICD-10-CM | POA: Diagnosis not present

## 2021-05-04 LAB — HM DIABETES EYE EXAM

## 2021-05-05 ENCOUNTER — Other Ambulatory Visit: Payer: Self-pay

## 2021-05-05 ENCOUNTER — Ambulatory Visit (HOSPITAL_BASED_OUTPATIENT_CLINIC_OR_DEPARTMENT_OTHER): Payer: BC Managed Care – PPO | Admitting: Physical Therapy

## 2021-05-05 ENCOUNTER — Encounter (HOSPITAL_BASED_OUTPATIENT_CLINIC_OR_DEPARTMENT_OTHER): Payer: Self-pay | Admitting: Physical Therapy

## 2021-05-05 DIAGNOSIS — M25562 Pain in left knee: Secondary | ICD-10-CM | POA: Diagnosis not present

## 2021-05-05 DIAGNOSIS — R262 Difficulty in walking, not elsewhere classified: Secondary | ICD-10-CM

## 2021-05-05 DIAGNOSIS — G8929 Other chronic pain: Secondary | ICD-10-CM

## 2021-05-05 DIAGNOSIS — M6281 Muscle weakness (generalized): Secondary | ICD-10-CM | POA: Diagnosis not present

## 2021-05-05 NOTE — Therapy (Signed)
[facility] [address] [place], [place], [address] Phone: [phone]   Fax:  [phone]  Physical Therapy Treatment/ERO  Patient Details  Name: Alison Best [facility] MRN: [id number] Date of Birth: 1956/04/17 Referring Provider (PT): Laurey Morale, MD   Encounter Date: 05/05/2021   PT End of Session - 05/05/21 1525     Visit Number 22    Number of Visits 30    Date for PT Re-Evaluation 09/22/21   extended time to accommodate for vacations   Authorization Type BCBS [id number] VL    PT Start Time 1520    PT Stop Time 1602    PT Time Calculation (min) 42 min    Activity Tolerance Patient tolerated treatment well    Behavior During Therapy [facility] for tasks assessed/performed             Past Medical History:  Diagnosis Date   Anal fissure    Arthritis    Bursitis    Constipation    COPD (chronic obstructive pulmonary disease) ([place])    Diabetes mellitus without complication ([place])    sees Dr. Cruzita Lederer    Diverticulitis of colon 02/2015   Ectopic pregnancy    Emphysema of lung ([place])    Frozen shoulder    Heart murmur    Hernia, abdominal    Hyperlipidemia    Hyperthyroidism    Infertility, female    Irregular heartbeat    Joint pain    Obesity    Osteoarthritis    Sleep apnea    Swallowing difficulty    Thyroid disease    hyperthyroid    Past Surgical History:  Procedure Laterality Date   APPENDECTOMY     CESAREAN SECTION     COLONOSCOPY  09-10-10   per Dr. Acquanetta Sit, diverticulosis of descending colon and sigmoid, internal hemorrhoids, repeat in 10 yrs   COLOSTOMY N/A 02/20/2015   Procedure: COLOSTOMY;  Surgeon: Jackolyn Confer, MD;  [facility];  Service: General;  Laterality: N/A;   COLOSTOMY REVISION  02/20/2015   Procedure: Sigmoid Colectomy;  Surgeon: Jackolyn Confer, MD;  [facility];  Service: General;;   CYSTOSCOPY N/A 02/20/2015   Procedure: Consuela Mimes;  Surgeon: Kathie Rhodes, MD;   [facility];  Service: Urology;  Laterality: N/A;   DILATION AND CURETTAGE OF UTERUS     x3   ILEO LOOP COLOSTOMY CLOSURE N/A 09/26/2015   Procedure: LAPAROSCOPIC LYSIS OF ADHESIONS, COLOSTOMY CLOSURE;  Surgeon: Jackolyn Confer, MD;  [facility];  Service: General;  Laterality: N/A;   LAPAROTOMY N/A 02/20/2015   Procedure: Emergency EXPLORATORY and drainiage of interabdominal abcesses;  Surgeon: Jackolyn Confer, MD;  [facility];  Service: General;  Laterality: N/A;   SALPINGOOPHORECTOMY Left 02/20/2015   Procedure: SALPINGO OOPHORECTOMY;  Surgeon: Jackolyn Confer, MD;  [facility];  Service: General;  Laterality: Left;   TONSILLECTOMY  1980    There were no vitals filed for this visit.   Subjective Assessment - 05/05/21 1521     Subjective In general I feel more flexibility and relative ease of movement. My knee feels stronger and I don't tape it- still don't alternate steps. My balance is still concerning but I am conscisous of what makes it better. I have loved the serpentining with Tharon Aquas and it gives me more flexibility. Hip eval on Aug 3. Aug 17 hernia consult.    How long can you walk comfortably? car to PT clinic is no longer an issue; was  able to walk museum with rollator, can do an hour of walking around with little rest breaks in rollator    Patient Stated Goals Sept 1st going on a trip to [place].get moving again & feeling better. improve balance, stairs    Currently in Pain? No/denies                [facility] PT Assessment - 05/05/21 0001       Assessment   Medical Diagnosis Lt knee pain    Referring Provider (PT) Laurey Morale, MD    Hand Dominance Right      Precautions   Precautions None      Restrictions   Weight Bearing Restrictions No      Balance Screen   Has the patient fallen in the past 6 months No      [place] residence      Prior Function   Level of [place] Retired      Associate Professor   Overall Cognitive Status Within Functional Limits for tasks assessed      Sensation   Additional Comments WFL      Special Tests   Other special tests 5TSTS 12s      6 minute walk test results    Endurance additional comments 793   began limping in last minute     Berg Balance Test   Sit to Stand Able to stand without using hands and stabilize independently    Standing Unsupported Able to stand safely 2 minutes    Sitting with Back Unsupported but Feet Supported on Floor or Stool Able to sit safely and securely 2 minutes    Stand to Sit Sits safely with minimal use of hands    Transfers Able to transfer safely, minor use of hands    Standing Unsupported with Eyes Closed Able to stand 10 seconds safely    Standing Unsupported with Feet Together Able to place feet together independently and stand 1 minute safely    From Standing, Reach Forward with Outstretched Arm Can reach forward >12 cm safely (5")    From Standing Position, Pick up Object from Floor Able to pick up shoe safely and easily    From Standing Position, Turn to Look Behind Over each Shoulder Looks behind from both sides and weight shifts well    Turn 360 Degrees Able to turn 360 degrees safely in 4 seconds or less    Standing Unsupported, Alternately Place Feet on Step/Stool Able to stand independently and safely and complete 8 steps in 20 seconds    Standing Unsupported, One Foot in [place] to place foot tandem independently and hold 30 seconds    Standing on One Leg Able to lift leg independently and hold equal to or more than 3 seconds    Total Score 53                                   PT Education - 05/05/21 1958     Education Details anatomy of condition, POC, objective measures    Person(s) Educated Patient    Methods Explanation    Comprehension Verbalized understanding;Need further instruction              PT Short Term Goals - 02/13/21  1151       PT SHORT TERM GOAL #1   Title  pt will be independent with HEP as it has been established in the short term.    Status Achieved      PT SHORT TERM GOAL #2   Title pt will verbalize feeling some improvement in overall mobility through ADLs.    Status Achieved               PT Long Term Goals - 05/05/21 1554       PT LONG TERM GOAL #1   Title pt will be able to navigate stairs at her home with confidence and necessary strength for reciprocal pattern    Baseline alternating on 3 steps on front, main entrance is 5-6 steps and does a step-to pattern- still the case today, does not have a railing at this time    Status On-going      PT LONG TERM GOAL #2   Title 5TSTS to improve by MDC of 4s    Baseline 13s today, community dwelling norm for age group is 11.4    Status On-going      PT LONG TERM GOAL #3   Title BERG to improve by [place] of 3 points    Baseline improved by 6 points, 48/56, will benefit from further balance improvement to reach functional level desired    Status Achieved      PT LONG TERM GOAL #4   Title 6MWT to improve by [place] of 191 ft    Baseline 793 ft, age appropriate 1765 and will continue to work toward this    Status On-going                   Plan - 05/05/21 2001     Clinical Impression Statement Pt has continued to make progress since beginning PT. has consults for hernia and hip coming up soon. She has 8 visits left in her insurance authorization so we will spread them out in order to continue progressing strength and stability. She is a member at [employer] and will continue with aquatic program for strength with reduced pressure through hip joint.    PT Treatment/Interventions ADLs/Self Care Home Management;Aquatic Therapy;Cryotherapy;Electrical Stimulation;Moist Heat;Balance training;Therapeutic exercise;Therapeutic activities;Functional mobility training;Stair training;Gait training;Neuromuscular re-education;Patient/family  education;Manual techniques;Taping;Dry needling;Passive range of motion    PT Next Visit Plan aquatics- 1 visit every 2 weeks for HEP progressions    PT Home Exercise Plan [facility]    Consulted and Agree with Plan of Care Patient             Patient will benefit from skilled therapeutic intervention in order to improve the following deficits and impairments:  Decreased strength, Postural dysfunction, Difficulty walking, Decreased activity tolerance, Decreased mobility, Decreased balance, Pain, Decreased endurance  Visit Diagnosis: Chronic pain of left knee - Plan: PT plan of care cert/re-cert  Muscle weakness (generalized) - Plan: PT plan of care cert/re-cert  Difficulty in walking, not elsewhere classified - Plan: PT plan of care cert/re-cert     Problem List Patient Active Problem List   Diagnosis Date Noted   Psoriasis 01/15/2021   Chronic pain of left knee 01/15/2021   Ventral hernia without obstruction or gangrene 01/15/2021   Class 3 severe obesity with serious comorbidity and body mass index (BMI) of 40.0 to 44.9 in adult [facility]) 01/15/2019   Acne rosacea 05/10/2016   Multinodular goiter 02/27/2016   Subclinical thyrotoxicosis 02/26/2016   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 Ezequiel Macauley PT, DPT 05/05/21 8:33 PM   [facility] [address] [place], [place], [address] Phone: [phone]   Fax:   [phone]  Name: Lacheryl Niesen [facility] MRN: [id number] Date of Birth: [birth year]

## 2021-05-06 ENCOUNTER — Other Ambulatory Visit (INDEPENDENT_AMBULATORY_CARE_PROVIDER_SITE_OTHER): Payer: BC Managed Care – PPO

## 2021-05-06 DIAGNOSIS — E0590 Thyrotoxicosis, unspecified without thyrotoxic crisis or storm: Secondary | ICD-10-CM

## 2021-05-06 LAB — T3, FREE: T3, Free: 4.6 pg/mL — ABNORMAL HIGH (ref 2.3–4.2)

## 2021-05-06 LAB — TSH: TSH: 2.12 u[IU]/mL (ref 0.35–5.50)

## 2021-05-06 LAB — T4, FREE: Free T4: 0.88 ng/dL (ref 0.60–1.60)

## 2021-05-07 ENCOUNTER — Encounter (HOSPITAL_BASED_OUTPATIENT_CLINIC_OR_DEPARTMENT_OTHER): Payer: Self-pay

## 2021-05-07 ENCOUNTER — Ambulatory Visit (HOSPITAL_BASED_OUTPATIENT_CLINIC_OR_DEPARTMENT_OTHER): Payer: BC Managed Care – PPO | Admitting: Physical Therapy

## 2021-05-08 ENCOUNTER — Other Ambulatory Visit: Payer: BC Managed Care – PPO

## 2021-05-08 ENCOUNTER — Ambulatory Visit (HOSPITAL_BASED_OUTPATIENT_CLINIC_OR_DEPARTMENT_OTHER): Payer: Self-pay | Admitting: Physical Therapy

## 2021-05-11 ENCOUNTER — Ambulatory Visit (HOSPITAL_BASED_OUTPATIENT_CLINIC_OR_DEPARTMENT_OTHER): Payer: Self-pay | Admitting: Physical Therapy

## 2021-05-14 ENCOUNTER — Ambulatory Visit (HOSPITAL_BASED_OUTPATIENT_CLINIC_OR_DEPARTMENT_OTHER): Payer: BC Managed Care – PPO | Admitting: Physical Therapy

## 2021-05-15 ENCOUNTER — Ambulatory Visit (HOSPITAL_BASED_OUTPATIENT_CLINIC_OR_DEPARTMENT_OTHER): Payer: Self-pay | Admitting: Physical Therapy

## 2021-05-18 ENCOUNTER — Ambulatory Visit (HOSPITAL_BASED_OUTPATIENT_CLINIC_OR_DEPARTMENT_OTHER): Payer: Self-pay | Admitting: Physical Therapy

## 2021-05-19 ENCOUNTER — Ambulatory Visit (HOSPITAL_BASED_OUTPATIENT_CLINIC_OR_DEPARTMENT_OTHER): Payer: BC Managed Care – PPO | Admitting: Physical Therapy

## 2021-05-21 ENCOUNTER — Other Ambulatory Visit: Payer: Self-pay

## 2021-05-21 ENCOUNTER — Ambulatory Visit (HOSPITAL_BASED_OUTPATIENT_CLINIC_OR_DEPARTMENT_OTHER): Payer: BC Managed Care – PPO | Admitting: Physical Therapy

## 2021-05-21 ENCOUNTER — Encounter (HOSPITAL_BASED_OUTPATIENT_CLINIC_OR_DEPARTMENT_OTHER): Payer: Self-pay | Admitting: Physical Therapy

## 2021-05-21 DIAGNOSIS — R262 Difficulty in walking, not elsewhere classified: Secondary | ICD-10-CM

## 2021-05-21 DIAGNOSIS — M6281 Muscle weakness (generalized): Secondary | ICD-10-CM

## 2021-05-21 DIAGNOSIS — M25562 Pain in left knee: Secondary | ICD-10-CM | POA: Diagnosis not present

## 2021-05-21 DIAGNOSIS — G8929 Other chronic pain: Secondary | ICD-10-CM | POA: Diagnosis not present

## 2021-05-21 NOTE — Therapy (Signed)
[facility] [address] [place], [place], [address] Phone: [phone]   Fax:  [phone]  Physical Therapy Treatment  Patient Details  Name: Alison Best [facility] MRN: [id number] Date of Birth: 1956/07/03 Referring Provider (PT): Laurey Morale, MD   Encounter Date: 05/21/2021    Past Medical History:  Diagnosis Date   Anal fissure    Arthritis    Bursitis    Constipation    COPD (chronic obstructive pulmonary disease) ([place])    Diabetes mellitus without complication [facility])    sees Dr. Cruzita Lederer    Diverticulitis of colon 02/2015   Ectopic pregnancy    Emphysema of lung ([place])    Frozen shoulder    Heart murmur    Hernia, abdominal    Hyperlipidemia    Hyperthyroidism    Infertility, female    Irregular heartbeat    Joint pain    Obesity    Osteoarthritis    Sleep apnea    Swallowing difficulty    Thyroid disease    hyperthyroid    Past Surgical History:  Procedure Laterality Date   APPENDECTOMY     CESAREAN SECTION     COLONOSCOPY  09-10-10   per Dr. Acquanetta Sit, diverticulosis of descending colon and sigmoid, internal hemorrhoids, repeat in 10 yrs   COLOSTOMY N/A 02/20/2015   Procedure: COLOSTOMY;  Surgeon: Jackolyn Confer, MD;  [facility];  Service: General;  Laterality: N/A;   COLOSTOMY REVISION  02/20/2015   Procedure: Sigmoid Colectomy;  Surgeon: Jackolyn Confer, MD;  [facility];  Service: General;;   CYSTOSCOPY N/A 02/20/2015   Procedure: Consuela Mimes;  Surgeon: Kathie Rhodes, MD;  [facility];  Service: Urology;  Laterality: N/A;   DILATION AND CURETTAGE OF UTERUS     x3   ILEO LOOP COLOSTOMY CLOSURE N/A 09/26/2015   Procedure: LAPAROSCOPIC LYSIS OF ADHESIONS, COLOSTOMY CLOSURE;  Surgeon: Jackolyn Confer, MD;  [facility];  Service: General;  Laterality: N/A;   LAPAROTOMY N/A 02/20/2015   Procedure: Emergency EXPLORATORY and drainiage of interabdominal abcesses;  Surgeon: Jackolyn Confer, MD;  [facility];  Service: General;  Laterality: N/A;   SALPINGOOPHORECTOMY Left 02/20/2015   Procedure: SALPINGO OOPHORECTOMY;  Surgeon: Jackolyn Confer, MD;  [facility];  Service: General;  Laterality: Left;   TONSILLECTOMY  1980    There were no vitals filed for this visit.   Subjective Assessment - 05/21/21 0807     Subjective Was able to walk around [place]- it was not easy but I did it.    Patient Stated Goals Sept 1st going on a trip to [place].get moving again & feeling better. improve balance, stairs    Currently in Pain? No/denies                AquaticREHABdocumentation: Pt requires the buoyancy of water for active assisted exercises with buoyancy supported for strengthening & ROM exercises, Hydrostatic pressure also supports joints by unweighting joint load by at least 50 % in 3-4 feet depth water. 80% in chest to neck deep water., and Water will allow for reduced gait deviation due to reduced joint loading through buoyancy to help patient improve posture without excess stress and pain  Water walking all directions, then added water dumbbells under water in fwd & backward Lateral stepping+ UE abd/add with water dumbbells With float weights on ankles- large range circles, march to glut set for press down SLS balance- cues for shifting weight to stack and avoid  compensation HS curls hip at 56 flx with float weights on ankles- hand on side of pool and noodle for upright posture Stretches- hip flexor lunge, adductor side squat, gastroc lunge                           PT Short Term Goals - 02/13/21 1151       PT SHORT TERM GOAL #1   Title pt will be independent with HEP as it has been established in the short term.    Status Achieved      PT SHORT TERM GOAL #2   Title pt will verbalize feeling some improvement in overall mobility through ADLs.    Status Achieved               PT Long Term Goals - 05/05/21 1554        PT LONG TERM GOAL #1   Title pt will be able to navigate stairs at her home with confidence and necessary strength for reciprocal pattern    Baseline alternating on 3 steps on front, main entrance is 5-6 steps and does a step-to pattern- still the case today, does not have a railing at this time    Status On-going      PT LONG TERM GOAL #2   Title 5TSTS to improve by MDC of 4s    Baseline 13s today, community dwelling norm for age group is 11.4    Status On-going      PT LONG TERM GOAL #3   Title BERG to improve by [place] of 3 points    Baseline improved by 6 points, 48/56, will benefit from further balance improvement to reach functional level desired    Status Achieved      PT LONG TERM GOAL #4   Title 6MWT to improve by [place] of 191 ft    Baseline 793 ft, age appropriate 13 and will continue to work toward this    Status On-going                    Patient will benefit from skilled therapeutic intervention in order to improve the following deficits and impairments:     Visit Diagnosis: No diagnosis found.     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 Alison Best PT, DPT 05/21/21 9:46 AM   [facility] [place], [place], [address] Phone: [phone]   Fax:  [phone]  Name: Alison Best [facility] MRN: [id number] Date of Birth: [birth year]

## 2021-05-22 ENCOUNTER — Ambulatory Visit (HOSPITAL_BASED_OUTPATIENT_CLINIC_OR_DEPARTMENT_OTHER): Payer: Self-pay | Admitting: Physical Therapy

## 2021-05-26 ENCOUNTER — Encounter (HOSPITAL_BASED_OUTPATIENT_CLINIC_OR_DEPARTMENT_OTHER): Payer: BC Managed Care – PPO | Admitting: Physical Therapy

## 2021-05-27 DIAGNOSIS — Z6841 Body Mass Index (BMI) 40.0 and over, adult: Secondary | ICD-10-CM | POA: Diagnosis not present

## 2021-05-27 DIAGNOSIS — M1612 Unilateral primary osteoarthritis, left hip: Secondary | ICD-10-CM | POA: Diagnosis not present

## 2021-06-02 ENCOUNTER — Ambulatory Visit (HOSPITAL_BASED_OUTPATIENT_CLINIC_OR_DEPARTMENT_OTHER): Payer: BC Managed Care – PPO | Admitting: Physical Therapy

## 2021-06-03 ENCOUNTER — Ambulatory Visit (HOSPITAL_BASED_OUTPATIENT_CLINIC_OR_DEPARTMENT_OTHER): Payer: BC Managed Care – PPO | Attending: Family Medicine | Admitting: Physical Therapy

## 2021-06-03 ENCOUNTER — Other Ambulatory Visit: Payer: Self-pay

## 2021-06-03 ENCOUNTER — Ambulatory Visit (INDEPENDENT_AMBULATORY_CARE_PROVIDER_SITE_OTHER): Payer: BC Managed Care – PPO | Admitting: Professional

## 2021-06-03 ENCOUNTER — Encounter (HOSPITAL_BASED_OUTPATIENT_CLINIC_OR_DEPARTMENT_OTHER): Payer: Self-pay | Admitting: Physical Therapy

## 2021-06-03 DIAGNOSIS — G8929 Other chronic pain: Secondary | ICD-10-CM | POA: Diagnosis not present

## 2021-06-03 DIAGNOSIS — M6281 Muscle weakness (generalized): Secondary | ICD-10-CM | POA: Diagnosis not present

## 2021-06-03 DIAGNOSIS — M25562 Pain in left knee: Secondary | ICD-10-CM | POA: Insufficient documentation

## 2021-06-03 DIAGNOSIS — R262 Difficulty in walking, not elsewhere classified: Secondary | ICD-10-CM | POA: Diagnosis not present

## 2021-06-03 DIAGNOSIS — F4321 Adjustment disorder with depressed mood: Secondary | ICD-10-CM | POA: Diagnosis not present

## 2021-06-03 NOTE — Therapy (Signed)
[place] [address] [place], [place], [address] Phone: [phone]   Fax:  [phone]  Physical Therapy Treatment  Patient Details  Name: Alison Best [facility] MRN: [id number] Date of Birth: Dec 25, 1955 Referring Provider (PT): Laurey Morale, MD   Encounter Date: 06/03/2021   PT End of Session - 06/03/21 1507     Visit Number 24    Number of Visits 30    Date for PT Re-Evaluation 09/22/21    Authorization Type BCBS 30 VL    PT Start Time 1420    PT Stop Time 1500    PT Time Calculation (min) 40 min    Equipment Utilized During Treatment Other (comment)    Activity Tolerance Patient tolerated treatment well    Behavior During Therapy [facility] for tasks assessed/performed             Past Medical History:  Diagnosis Date   Anal fissure    Arthritis    Bursitis    Constipation    COPD (chronic obstructive pulmonary disease) ([place])    Diabetes mellitus without complication ([place])    sees Dr. Cruzita Lederer    Diverticulitis of colon 02/2015   Ectopic pregnancy    Emphysema of lung ([place])    Frozen shoulder    Heart murmur    Hernia, abdominal    Hyperlipidemia    Hyperthyroidism    Infertility, female    Irregular heartbeat    Joint pain    Obesity    Osteoarthritis    Sleep apnea    Swallowing difficulty    Thyroid disease    hyperthyroid    Past Surgical History:  Procedure Laterality Date   APPENDECTOMY     CESAREAN SECTION     COLONOSCOPY  09-10-10   per Dr. Acquanetta Sit, diverticulosis of descending colon and sigmoid, internal hemorrhoids, repeat in 10 yrs   COLOSTOMY N/A 02/20/2015   Procedure: COLOSTOMY;  Surgeon: Jackolyn Confer, MD;  Location: [facility];  Service: General;  Laterality: N/A;   COLOSTOMY REVISION  02/20/2015   Procedure: Sigmoid Colectomy;  Surgeon: Jackolyn Confer, MD;  Location[facility];  Service: General;;   CYSTOSCOPY N/A 02/20/2015   Procedure: Consuela Mimes;  Surgeon: Kathie Rhodes, MD;   [facility];  Service: Urology;  Laterality: N/A;   DILATION AND CURETTAGE OF UTERUS     x3   ILEO LOOP COLOSTOMY CLOSURE N/A 09/26/2015   Procedure: LAPAROSCOPIC LYSIS OF ADHESIONS, COLOSTOMY CLOSURE;  Surgeon: Jackolyn Confer, MD;  [facility];  Service: General;  Laterality: N/A;   LAPAROTOMY N/A 02/20/2015   Procedure: Emergency EXPLORATORY and drainiage of interabdominal abcesses;  Surgeon: Jackolyn Confer, MD;  [facility];  Service: General;  Laterality: N/A;   SALPINGOOPHORECTOMY Left 02/20/2015   Procedure: SALPINGO OOPHORECTOMY;  Surgeon: Jackolyn Confer, MD;  [facility];  Service: General;  Laterality: Left;   TONSILLECTOMY  1980    There were no vitals filed for this visit.   Subjective Assessment - 06/03/21 1919     Subjective Need to lose some weight befroe they will do my THR    Currently in Pain? No/denies                                         PT Short Term Goals - 02/13/21 1151       PT SHORT TERM GOAL #1  Title pt will be independent with HEP as it has been established in the short term.    Status Achieved      PT SHORT TERM GOAL #2   Title pt will verbalize feeling some improvement in overall mobility through ADLs.    Status Achieved               PT Long Term Goals - 05/05/21 1554       PT LONG TERM GOAL #1   Title pt will be able to navigate stairs at her home with confidence and necessary strength for reciprocal pattern    Baseline alternating on 3 steps on front, main entrance is 5-6 steps and does a step-to pattern- still the case today, does not have a railing at this time    Status On-going      PT LONG TERM GOAL #2   Title 5TSTS to improve by MDC of 4s    Baseline 13s today, community dwelling norm for age group is 11.4    Status On-going      PT LONG TERM GOAL #3   Title BERG to improve by [place] of 3 points    Baseline improved by 6 points, 48/56, will benefit from further balance  improvement to reach functional level desired    Status Achieved      PT LONG TERM GOAL #4   Title 6MWT to improve by [place] of 191 ft    Baseline 793 ft, age appropriate 43 and will continue to work toward this    Status On-going            AquaticREHABdocumentation: Pt requires the buoyancy of water for active assisted exercises with buoyancy supported for strengthening & ROM exercises, Hydrostatic pressure also supports joints by unweighting joint load by at least 50 % in 3-4 feet depth water. 80% in chest to neck deep water., and Water will allow for reduced gait deviation due to reduced joint loading through buoyancy to help patient improve posture without excess stress and pain   Warm up Water walking all directions  Vertical suspension  Sitting on noodle aerobic capacity challenge along with le strengthening kicking at knee then at hip, scissoring and add/abd 20 reps. VC for speed. 1 min rest between x 10 mins.  Standing Balance Static standing vision eliminated feet together x 20 sec Unilateral stance goal to hold X 20 sec multiple attempts bilat able to hold on rle.  Improves with completion Step ups on bottom step forward and backward leading with each LE x 10 reps. VC and tc for pacing Hip flexor, adductor and hamstring stretch 3 x 20 seconds using noodle   Patient will benefit from skilled therapeutic intervention in order to improve the following deficits and impairments:  Decreased strength, Postural dysfunction, Difficulty walking, Decreased activity tolerance, Decreased mobility, Decreased balance, Pain, Decreased endurance, Impaired perceived functional ability  Visit Diagnosis: Chronic pain of left knee  Muscle weakness (generalized)  Difficulty in walking, not elsewhere classified     Problem List Patient Active Problem List   Diagnosis Date Noted   Psoriasis 01/15/2021   Chronic pain of left knee 01/15/2021   Ventral hernia without obstruction or  gangrene 01/15/2021   Class 3 severe obesity with serious comorbidity and body mass index (BMI) of 40.0 to 44.9 in adult [facility]) 01/15/2019   Acne rosacea 05/10/2016   Multinodular goiter 02/27/2016   Subclinical thyrotoxicosis 02/26/2016   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 09/26/15 09/26/2015  Prediabetes 07/30/2015   Perforation of sigmoid colon ([place]) 02/20/2015   Diverticulitis of large intestine with abscess without bleeding 02/18/2015   OSA (obstructive sleep apnea) 05/09/2013   GASTROENTERITIS 08/18/2010   RAYNAUD'S SYNDROME 02/17/2009   PLANTAR FASCIITIS 02/17/2009   Asthma 06/12/2008   Hyperlipidemia 09/04/2007   DEVIATED SEPTUM 09/04/2007   ALLERGIC RHINITIS 09/04/2007   GERD 09/04/2007   OSTEOARTHRITIS 09/04/2007   HEADACHE 09/04/2007    Vedia Pereyra MPT 06/03/2021, 7:21 PM  [facility] [address] [place], [place], [address] Phone: [phone]   Fax:  [phone]  Name: Jennah Hilliker [facility] MRN: [id number] Date of Birth: 13-Jan-1956

## 2021-06-05 ENCOUNTER — Encounter: Payer: Self-pay | Admitting: Family Medicine

## 2021-06-05 MED ORDER — NIRMATRELVIR/RITONAVIR (PAXLOVID)TABLET: 3.0000 | ORAL_TABLET | Freq: Two times a day (BID) | ORAL | 0 refills | Status: AC

## 2021-06-05 MED ORDER — ACETAZOLAMIDE ER 500 MG PO CP12: 500.0000 mg | ORAL_CAPSULE | Freq: Two times a day (BID) | ORAL | 2 refills | Status: DC

## 2021-06-05 MED ORDER — AMOXICILLIN-POT CLAVULANATE 875-125 MG PO TABS: 1.0000 | ORAL_TABLET | Freq: Two times a day (BID) | ORAL | 1 refills | Status: DC

## 2021-06-05 NOTE — Telephone Encounter (Signed)
I sent all 3 of these to [name]

## 2021-06-09 ENCOUNTER — Telehealth: Payer: Self-pay | Admitting: Cardiovascular Disease

## 2021-06-09 MED ORDER — DILTIAZEM HCL ER COATED BEADS 120 MG PO CP24: 120.0000 mg | ORAL_CAPSULE | Freq: Every day | ORAL | 1 refills | Status: DC

## 2021-06-09 NOTE — Telephone Encounter (Signed)
*  STAT* If patient is at the pharmacy, call can be transferred to refill team.   1. Which medications need to be refilled? (please list name of each medication and dose if known)  diltiazem (CARDIZEM CD) 120 MG 24 hr capsule [id number]  2. Which pharmacy/location (including street and city if local pharmacy) is medication to be sent to?  [facility] [place], [place] AT [address] Phone:  [phone]      3. Do they need a 30 day or 90 day supply? 90   Pt has an appt Jan 30th

## 2021-06-09 NOTE — Telephone Encounter (Signed)
Pt scheduled to see Dr. Angelena Form, 10/2021.  Refill for Diltiazem sent to [name].

## 2021-06-11 DIAGNOSIS — R1032 Left lower quadrant pain: Secondary | ICD-10-CM | POA: Diagnosis not present

## 2021-06-11 DIAGNOSIS — K432 Incisional hernia without obstruction or gangrene: Secondary | ICD-10-CM | POA: Diagnosis not present

## 2021-06-11 DIAGNOSIS — K8020 Calculus of gallbladder without cholecystitis without obstruction: Secondary | ICD-10-CM | POA: Diagnosis not present

## 2021-06-16 ENCOUNTER — Ambulatory Visit (HOSPITAL_BASED_OUTPATIENT_CLINIC_OR_DEPARTMENT_OTHER): Payer: BC Managed Care – PPO | Admitting: Physical Therapy

## 2021-06-16 ENCOUNTER — Other Ambulatory Visit: Payer: Self-pay

## 2021-06-16 DIAGNOSIS — M25562 Pain in left knee: Secondary | ICD-10-CM | POA: Diagnosis not present

## 2021-06-16 DIAGNOSIS — R262 Difficulty in walking, not elsewhere classified: Secondary | ICD-10-CM | POA: Diagnosis not present

## 2021-06-16 DIAGNOSIS — G8929 Other chronic pain: Secondary | ICD-10-CM | POA: Diagnosis not present

## 2021-06-16 DIAGNOSIS — M6281 Muscle weakness (generalized): Secondary | ICD-10-CM | POA: Diagnosis not present

## 2021-06-16 NOTE — Therapy (Signed)
[place] [address] [place], [place], [address] Phone: [phone]   Fax:  [phone]  Physical Therapy Treatment  Patient Details  Name: Alison Best [facility] MRN: [id number] Date of Birth: 02/09/1956 Referring Provider (PT): Laurey Morale, MD   Encounter Date: 06/16/2021   PT End of Session - 06/16/21 1131     Visit Number 25    Number of Visits 30    Date for PT Re-Evaluation 09/22/21    Authorization Type BCBS 30 VL    PT Start Time 1120    PT Stop Time 1200    PT Time Calculation (min) 40 min             Past Medical History:  Diagnosis Date   Anal fissure    Arthritis    Bursitis    Constipation    COPD (chronic obstructive pulmonary disease) ([place])    Diabetes mellitus without complication ([place])    sees Dr. Cruzita Lederer    Diverticulitis of colon 02/2015   Ectopic pregnancy    Emphysema of lung ([place])    Frozen shoulder    Heart murmur    Hernia, abdominal    Hyperlipidemia    Hyperthyroidism    Infertility, female    Irregular heartbeat    Joint pain    Obesity    Osteoarthritis    Sleep apnea    Swallowing difficulty    Thyroid disease    hyperthyroid    Past Surgical History:  Procedure Laterality Date   APPENDECTOMY     CESAREAN SECTION     COLONOSCOPY  09-10-10   per Dr. Acquanetta Sit, diverticulosis of descending colon and sigmoid, internal hemorrhoids, repeat in 10 yrs   COLOSTOMY N/A 02/20/2015   Procedure: COLOSTOMY;  Surgeon: Jackolyn Confer, MD;  [facility];  Service: General;  Laterality: N/A;   COLOSTOMY REVISION  02/20/2015   Procedure: Sigmoid Colectomy;  Surgeon: Jackolyn Confer, MD;  [facility];  Service: General;;   CYSTOSCOPY N/A 02/20/2015   Procedure: Consuela Mimes;  Surgeon: Kathie Rhodes, MD;  [facility];  Service: Urology;  Laterality: N/A;   DILATION AND CURETTAGE OF UTERUS     x3   ILEO LOOP COLOSTOMY CLOSURE N/A 09/26/2015   Procedure: LAPAROSCOPIC  LYSIS OF ADHESIONS, COLOSTOMY CLOSURE;  Surgeon: Jackolyn Confer, MD;  [facility];  Service: General;  Laterality: N/A;   LAPAROTOMY N/A 02/20/2015   Procedure: Emergency EXPLORATORY and drainiage of interabdominal abcesses;  Surgeon: Jackolyn Confer, MD;  [facility];  Service: General;  Laterality: N/A;   SALPINGOOPHORECTOMY Left 02/20/2015   Procedure: SALPINGO OOPHORECTOMY;  Surgeon: Jackolyn Confer, MD;  [facility];  Service: General;  Laterality: Left;   TONSILLECTOMY  1980    There were no vitals filed for this visit.   Subjective Assessment - 06/16/21 1513     Subjective "Saw surgeon about hernia.  Has scheduled another test to get better idea of size."    Currently in Pain? Yes    Pain Score 2     Pain Location Hip    Pain Orientation Left    Pain Descriptors / Indicators Aching    Pain Type Chronic pain    Aggravating Factors  "I feel like now that I have seen the xrays my hip is hurting me more"  PT Short Term Goals - 02/13/21 1151       PT SHORT TERM GOAL #1   Title pt will be independent with HEP as it has been established in the short term.    Status Achieved      PT SHORT TERM GOAL #2   Title pt will verbalize feeling some improvement in overall mobility through ADLs.    Status Achieved               PT Long Term Goals - 05/05/21 1554       PT LONG TERM GOAL #1   Title pt will be able to navigate stairs at her home with confidence and necessary strength for reciprocal pattern    Baseline alternating on 3 steps on front, main entrance is 5-6 steps and does a step-to pattern- still the case today, does not have a railing at this time    Status On-going      PT LONG TERM GOAL #2   Title 5TSTS to improve by MDC of 4s    Baseline 13s today, community dwelling norm for age group is 11.4    Status On-going      PT LONG TERM GOAL #3   Title BERG to improve by [place] of 3  points    Baseline improved by 6 points, 48/56, will benefit from further balance improvement to reach functional level desired    Status Achieved      PT LONG TERM GOAL #4   Title 6MWT to improve by [place] of 191 ft    Baseline 793 ft, age appropriate 24 and will continue to work toward this    Status On-going            TREATMENT Pt seen for aquatic therapy today.  Treatment took place in water 3.25-4.8 ft in depth at the [employer] [facility] pool. Temp of water was 91.  Pt entered/exited the pool via stairs step to pattern independently with bilat rail.   Warm up walking  Siting IT band stretching R/L   Standing Noodle kick downs supported by hand buoys 3 x 12 reps hip flex and external rotation Hip adduction/abduction 2 x 10 reps.  Hip flex;ext 2 x 10 hip flex stretching with noodle bilat 3 x 30 sec hold, then full hip and trunk extension x 30 sec hold. Hip flex strengtheing noodle pull through to wall x 10 reps bilat   Ai Chi # 13 completed segmentally hip flex then hip extension progressed to single movement then instructed to string together 5 reps.    Vertical suspension Supported by yellow noodle bicyclicing x 5 mins 20 fast rotations then 20 slow for le strengthening and aerobic capcity triing       Pt requires buoyancy for support and to offload joints with strengthening exercises. Viscosity of the water is needed for resistance of strengthening; water current perturbations provides challenge to standing balance unsupported, requiring increased core activation       Plan - 06/16/21 1128     Clinical Impression Statement Discomfort and tightness about left IT band. Stretching relieves. Consulted Jessica PT in regards to pt indep taping of knee. Tested pt for proper tracking of patella which is normal.  She will continue placing tape just above and below knee for stabilization.  Began pt on Ai Chi #13 breaking it into segments prior to pulling entire movement  together.  Pt progressed quickly with demonstration improved core strength and balance.    PT Treatment/Interventions  ADLs/Self Care Home Management;Aquatic Therapy;Cryotherapy;Electrical Stimulation;Moist Heat;Balance training;Therapeutic exercise;Therapeutic activities;Functional mobility training;Stair training;Gait training;Neuromuscular re-education;Patient/family education;Manual techniques;Taping;Dry needling;Passive range of motion    PT Home Exercise Plan [facility]             Patient will benefit from skilled therapeutic intervention in order to improve the following deficits and impairments:  Decreased strength, Postural dysfunction, Difficulty walking, Decreased activity tolerance, Decreased mobility, Decreased balance, Pain, Decreased endurance, Impaired perceived functional ability  Visit Diagnosis: Chronic pain of left knee  Muscle weakness (generalized)  Difficulty in walking, not elsewhere classified     Problem List Patient Active Problem List   Diagnosis Date Noted   Psoriasis 01/15/2021   Chronic pain of left knee 01/15/2021   Ventral hernia without obstruction or gangrene 01/15/2021   Class 3 severe obesity with serious comorbidity and body mass index (BMI) of 40.0 to 44.9 in adult [facility]) 01/15/2019   Acne rosacea 05/10/2016   Multinodular goiter 02/27/2016   Subclinical thyrotoxicosis 02/26/2016   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 [place], [place], [address] Phone: [phone]   Fax:  [phone]  Name: Chika Marie [facility] MRN: [id number] Date of Birth: Mar 24, 1956

## 2021-06-17 ENCOUNTER — Other Ambulatory Visit: Payer: Self-pay | Admitting: General Surgery

## 2021-06-17 DIAGNOSIS — K432 Incisional hernia without obstruction or gangrene: Secondary | ICD-10-CM

## 2021-07-07 ENCOUNTER — Encounter: Payer: Self-pay | Admitting: Family Medicine

## 2021-07-07 DIAGNOSIS — U071 COVID-19: Secondary | ICD-10-CM | POA: Insufficient documentation

## 2021-07-07 NOTE — Telephone Encounter (Signed)
I did note the Covid infection in her chart. After taking Paxlovid, there really is not anything else she can do but rest. The remaining symptoms will fade away over time.

## 2021-07-08 ENCOUNTER — Other Ambulatory Visit: Payer: BC Managed Care – PPO

## 2021-07-10 ENCOUNTER — Ambulatory Visit (HOSPITAL_BASED_OUTPATIENT_CLINIC_OR_DEPARTMENT_OTHER): Payer: Self-pay | Admitting: Physical Therapy

## 2021-07-15 ENCOUNTER — Ambulatory Visit (INDEPENDENT_AMBULATORY_CARE_PROVIDER_SITE_OTHER): Payer: BC Managed Care – PPO | Admitting: Professional

## 2021-07-15 DIAGNOSIS — F4321 Adjustment disorder with depressed mood: Secondary | ICD-10-CM

## 2021-07-21 ENCOUNTER — Encounter (HOSPITAL_BASED_OUTPATIENT_CLINIC_OR_DEPARTMENT_OTHER): Payer: Self-pay | Admitting: Physical Therapy

## 2021-07-21 ENCOUNTER — Other Ambulatory Visit: Payer: Self-pay

## 2021-07-21 ENCOUNTER — Ambulatory Visit (HOSPITAL_BASED_OUTPATIENT_CLINIC_OR_DEPARTMENT_OTHER): Payer: BC Managed Care – PPO | Attending: Family Medicine | Admitting: Physical Therapy

## 2021-07-21 DIAGNOSIS — M6281 Muscle weakness (generalized): Secondary | ICD-10-CM | POA: Insufficient documentation

## 2021-07-21 DIAGNOSIS — R262 Difficulty in walking, not elsewhere classified: Secondary | ICD-10-CM | POA: Diagnosis not present

## 2021-07-21 DIAGNOSIS — M25562 Pain in left knee: Secondary | ICD-10-CM | POA: Insufficient documentation

## 2021-07-21 DIAGNOSIS — G8929 Other chronic pain: Secondary | ICD-10-CM | POA: Insufficient documentation

## 2021-07-21 NOTE — Therapy (Signed)
[facility] [address] [place], [place], [address] Phone: [phone]   Fax:  [phone]  Physical Therapy Treatment  Patient Details  Name: Alison Best [facility] MRN: [id number] Date of Birth: [birth year] Referring Provider (PT): Laurey Morale, MD   Encounter Date: 07/21/2021   PT End of Session - 07/21/21 1037     Visit Number 26    Number of Visits 30    Date for PT Re-Evaluation 09/22/21    Authorization Type BCBS 30 VL    PT Start Time 1031    PT Stop Time 1114    PT Time Calculation (min) 43 min    Equipment Utilized During Treatment Other (comment)    Activity Tolerance Patient tolerated treatment well    Behavior During Therapy [facility] for tasks assessed/performed             Past Medical History:  Diagnosis Date   Anal fissure    Arthritis    Bursitis    Constipation    COPD (chronic obstructive pulmonary disease) ([place])    Diabetes mellitus without complication ([place])    sees Dr. Cruzita Lederer    Diverticulitis of colon 02/2015   Ectopic pregnancy    Emphysema of lung ([place])    Frozen shoulder    Heart murmur    Hernia, abdominal    Hyperlipidemia    Hyperthyroidism    Infertility, female    Irregular heartbeat    Joint pain    Obesity    Osteoarthritis    Sleep apnea    Swallowing difficulty    Thyroid disease    hyperthyroid    Past Surgical History:  Procedure Laterality Date   APPENDECTOMY     CESAREAN SECTION     COLONOSCOPY  09-10-10   per Dr. Acquanetta Sit, diverticulosis of descending colon and sigmoid, internal hemorrhoids, repeat in 10 yrs   COLOSTOMY N/A 02/20/2015   Procedure: COLOSTOMY;  Surgeon: Jackolyn Confer, MD;  Location: [facility];  Service: General;  Laterality: N/A;   COLOSTOMY REVISION  02/20/2015   Procedure: Sigmoid Colectomy;  Surgeon: Jackolyn Confer, MD;  Location: [facility];  Service: General;;   CYSTOSCOPY N/A 02/20/2015   Procedure: Consuela Mimes;  Surgeon: Kathie Rhodes, MD;   [facility];  Service: Urology;  Laterality: N/A;   DILATION AND CURETTAGE OF UTERUS     x3   ILEO LOOP COLOSTOMY CLOSURE N/A 09/26/2015   Procedure: LAPAROSCOPIC LYSIS OF ADHESIONS, COLOSTOMY CLOSURE;  Surgeon: Jackolyn Confer, MD;  [facility];  Service: General;  Laterality: N/A;   LAPAROTOMY N/A 02/20/2015   Procedure: Emergency EXPLORATORY and drainiage of interabdominal abcesses;  Surgeon: Jackolyn Confer, MD;  [facility];  Service: General;  Laterality: N/A;   SALPINGOOPHORECTOMY Left 02/20/2015   Procedure: SALPINGO OOPHORECTOMY;  Surgeon: Jackolyn Confer, MD;  [facility];  Service: General;  Laterality: Left;   TONSILLECTOMY  1980    There were no vitals filed for this visit.   Subjective Assessment - 07/21/21 1354     Subjective "back from vacation, did everything I wanted to, wasn't really limited, had covid when I returned. Had to put off scan on my abdomin, have it next week.    Currently in Pain? Yes    Pain Score 2     Pain Location Hip    Pain Orientation Left    Pain Descriptors / Indicators Sore;Aching    Pain Type Chronic pain    Pain Onset More than a  month ago    Pain Frequency Intermittent                                          PT Short Term Goals - 02/13/21 1151       PT SHORT TERM GOAL #1   Title pt will be independent with HEP as it has been established in the short term.    Status Achieved      PT SHORT TERM GOAL #2   Title pt will verbalize feeling some improvement in overall mobility through ADLs.    Status Achieved               PT Long Term Goals - 05/05/21 1554       PT LONG TERM GOAL #1   Title pt will be able to navigate stairs at her home with confidence and necessary strength for reciprocal pattern    Baseline alternating on 3 steps on front, main entrance is 5-6 steps and does a step-to pattern- still the case today, does not have a railing at this time    Status On-going       PT LONG TERM GOAL #2   Title 5TSTS to improve by MDC of 4s    Baseline 13s today, community dwelling norm for age group is 11.4    Status On-going      PT LONG TERM GOAL #3   Title BERG to improve by [place] of 3 points    Baseline improved by 6 points, 48/56, will benefit from further balance improvement to reach functional level desired    Status Achieved      PT LONG TERM GOAL #4   Title 6MWT to improve by [place] of 191 ft    Baseline 793 ft, age appropriate 46 and will continue to work toward this    Status On-going             TREATMENT Pt seen for aquatic therapy today.  Treatment took place in water 3.25-4.8 ft in depth at the [facility] pool. Temp of water was 91.  Pt entered/exited the pool via stairs step to pattern independently with bilat rail.   Warm up Walking forward, back and side stepping 6-8 widths without UE support     Standing Stretching: -IT band stretching R/L 4 x 20 sec hold -hamstring and gastroc stretch using noodle 3 x 20 sec hold Strengthening LE and core: -Noodle kick downs supported by hand buoys 3 x 12 reps hip flex and external rotation -Hip adduction/abduction 2 x 10 reps.   -Hip flex2x10 - hip ext 2 x 10 -Kick board push downs 2 x 12 reps with manual perturbations by PT -Lateral kick board rotations R/L vc for increased submersion of board to increase resistance for core strengthening  Lunges x 4 widths.   Pt requires buoyancy for support and to offload joints with strengthening exercises. Viscosity of the water is needed for resistance of strengthening; water current perturbations provides challenge to standing balance unsupported, requiring increased core activation       Plan - 07/21/21 1356     Clinical Impression Statement pt home from her 2 week vacation.  No increase in pain in hip.  Has lost 1 lb. Pt reports not doing much exercises over last few weeks. She is instructed through exercising and stretching following  last visit.  She does tire /  fatigue quickly but strengthen in core good as she demonstrtaes improved balance with activities completing without UE support or LOB.    Stability/Clinical Decision Making Evolving/Moderate complexity    Clinical Decision Making Moderate    Rehab Potential Good    PT Frequency 2x / week    PT Duration 8 weeks    PT Treatment/Interventions ADLs/Self Care Home Management;Aquatic Therapy;Cryotherapy;Electrical Stimulation;Moist Heat;Balance training;Therapeutic exercise;Therapeutic activities;Functional mobility training;Stair training;Gait training;Neuromuscular re-education;Patient/family education;Manual techniques;Taping;Dry needling;Passive range of motion    PT Next Visit Plan stretching.  Test results?    PT Home Exercise Plan [facility]             Patient will benefit from skilled therapeutic intervention in order to improve the following deficits and impairments:  Decreased strength, Postural dysfunction, Difficulty walking, Decreased activity tolerance, Decreased mobility, Decreased balance, Pain, Decreased endurance, Impaired perceived functional ability  Visit Diagnosis: Chronic pain of left knee  Muscle weakness (generalized)  Difficulty in walking, not elsewhere classified     Problem List Patient Active Problem List   Diagnosis Date Noted   COVID-19 virus infection 07/07/2021   Psoriasis 01/15/2021   Chronic pain of left knee 01/15/2021   Ventral hernia without obstruction or gangrene 01/15/2021   Class 3 severe obesity with serious comorbidity and body mass index (BMI) of 40.0 to 44.9 in adult [facility]) 01/15/2019   Acne rosacea 05/10/2016   Multinodular goiter 02/27/2016   Subclinical thyrotoxicosis 02/26/2016   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 [place], [place], [address] Phone: [phone]   Fax:  [phone]  Name: Sandy Haye [facility] MRN: [id number] Date of Birth: 1956-09-20

## 2021-07-25 ENCOUNTER — Other Ambulatory Visit: Payer: Self-pay | Admitting: Physician Assistant

## 2021-07-29 ENCOUNTER — Ambulatory Visit
Admission: RE | Admit: 2021-07-29 | Discharge: 2021-07-29 | Disposition: A | Discharge status: Home / Self Care | Payer: BC Managed Care – PPO | Source: Ambulatory Visit | Attending: General Surgery | Admitting: General Surgery

## 2021-07-29 DIAGNOSIS — K432 Incisional hernia without obstruction or gangrene: Secondary | ICD-10-CM

## 2021-07-29 DIAGNOSIS — K8020 Calculus of gallbladder without cholecystitis without obstruction: Secondary | ICD-10-CM | POA: Diagnosis not present

## 2021-07-29 DIAGNOSIS — K760 Fatty (change of) liver, not elsewhere classified: Secondary | ICD-10-CM | POA: Diagnosis not present

## 2021-07-29 DIAGNOSIS — R1032 Left lower quadrant pain: Secondary | ICD-10-CM | POA: Diagnosis not present

## 2021-08-04 ENCOUNTER — Other Ambulatory Visit: Payer: Self-pay

## 2021-08-04 ENCOUNTER — Ambulatory Visit (HOSPITAL_BASED_OUTPATIENT_CLINIC_OR_DEPARTMENT_OTHER): Payer: BC Managed Care – PPO | Attending: Family Medicine | Admitting: Physical Therapy

## 2021-08-04 ENCOUNTER — Encounter (HOSPITAL_BASED_OUTPATIENT_CLINIC_OR_DEPARTMENT_OTHER): Payer: Self-pay | Admitting: Physical Therapy

## 2021-08-04 DIAGNOSIS — G8929 Other chronic pain: Secondary | ICD-10-CM | POA: Diagnosis not present

## 2021-08-04 DIAGNOSIS — R262 Difficulty in walking, not elsewhere classified: Secondary | ICD-10-CM | POA: Diagnosis not present

## 2021-08-04 DIAGNOSIS — M6281 Muscle weakness (generalized): Secondary | ICD-10-CM | POA: Diagnosis not present

## 2021-08-04 DIAGNOSIS — M25562 Pain in left knee: Secondary | ICD-10-CM | POA: Diagnosis not present

## 2021-08-04 NOTE — Therapy (Signed)
[facility] [address] [place], [place], [address] Phone: [phone]   Fax:  [phone]  Physical Therapy Treatment  Patient Details  Name: Alison Best [facility] MRN: [id number] Date of Birth: 04/10/1956 Referring Provider (PT): Laurey Morale, MD   Encounter Date: 08/04/2021   PT End of Session - 08/04/21 1043     Visit Number 27    Number of Visits 30    Date for PT Re-Evaluation 09/22/21    Authorization Type BCBS 30 VL    PT Start Time 1034    PT Stop Time 1116    PT Time Calculation (min) 42 min    Equipment Utilized During Treatment Other (comment)    Activity Tolerance Patient tolerated treatment well    Behavior During Therapy [facility] for tasks assessed/performed             Past Medical History:  Diagnosis Date   Anal fissure    Arthritis    Bursitis    Constipation    COPD (chronic obstructive pulmonary disease) ([place])    Diabetes mellitus without complication ([place])    sees Dr. Cruzita Lederer    Diverticulitis of colon 02/2015   Ectopic pregnancy    Emphysema of lung ([place])    Frozen shoulder    Heart murmur    Hernia, abdominal    Hyperlipidemia    Hyperthyroidism    Infertility, female    Irregular heartbeat    Joint pain    Obesity    Osteoarthritis    Sleep apnea    Swallowing difficulty    Thyroid disease    hyperthyroid    Past Surgical History:  Procedure Laterality Date   APPENDECTOMY     CESAREAN SECTION     COLONOSCOPY  09-10-10   per Dr. Acquanetta Sit, diverticulosis of descending colon and sigmoid, internal hemorrhoids, repeat in 10 yrs   COLOSTOMY N/A 02/20/2015   Procedure: COLOSTOMY;  Surgeon: Jackolyn Confer, MD;  [facility];  Service: General;  Laterality: N/A;   COLOSTOMY REVISION  02/20/2015   Procedure: Sigmoid Colectomy;  Surgeon: Jackolyn Confer, MD;  [facility];  Service: General;;   CYSTOSCOPY N/A 02/20/2015   Procedure: Consuela Mimes;  Surgeon: Kathie Rhodes,  MD;  [facility];  Service: Urology;  Laterality: N/A;   DILATION AND CURETTAGE OF UTERUS     x3   ILEO LOOP COLOSTOMY CLOSURE N/A 09/26/2015   Procedure: LAPAROSCOPIC LYSIS OF ADHESIONS, COLOSTOMY CLOSURE;  Surgeon: Jackolyn Confer, MD;  [facility];  Service: General;  Laterality: N/A;   LAPAROTOMY N/A 02/20/2015   Procedure: Emergency EXPLORATORY and drainiage of interabdominal abcesses;  Surgeon: Jackolyn Confer, MD;  [facility];  Service: General;  Laterality: N/A;   SALPINGOOPHORECTOMY Left 02/20/2015   Procedure: SALPINGO OOPHORECTOMY;  Surgeon: Jackolyn Confer, MD;  [facility];  Service: General;  Laterality: Left;   TONSILLECTOMY  1980    There were no vitals filed for this visit.   Subjective Assessment - 08/04/21 1340     Subjective "Had MRI, esluts are back" No other questions or concerns    Currently in Pain? Yes    Pain Score 1     Pain Location Hip    Pain Orientation Left    Pain Descriptors / Indicators Aching;Sore    Pain Type Chronic pain    Pain Onset More than a month ago  PT Education - 08/04/21 1348     Education Details MRI results    Person(s) Educated Patient    Methods Explanation    Comprehension Verbalized understanding              PT Short Term Goals - 02/13/21 1151       PT SHORT TERM GOAL #1   Title pt will be independent with HEP as it has been established in the short term.    Status Achieved      PT SHORT TERM GOAL #2   Title pt will verbalize feeling some improvement in overall mobility through ADLs.    Status Achieved               PT Long Term Goals - 05/05/21 1554       PT LONG TERM GOAL #1   Title pt will be able to navigate stairs at her home with confidence and necessary strength for reciprocal pattern    Baseline alternating on 3 steps on front, main entrance is 5-6 steps and does a step-to pattern- still the case today, does  not have a railing at this time    Status On-going      PT LONG TERM GOAL #2   Title 5TSTS to improve by MDC of 4s    Baseline 13s today, community dwelling norm for age group is 11.4    Status On-going      PT LONG TERM GOAL #3   Title BERG to improve by [place] of 3 points    Baseline improved by 6 points, 48/56, will benefit from further balance improvement to reach functional level desired    Status Achieved      PT LONG TERM GOAL #4   Title 6MWT to improve by [place] of 191 ft    Baseline 793 ft, age appropriate 34 and will continue to work toward this    Status On-going              TREATMENT Pt seen for aquatic therapy today.  Treatment took place in water 3.25-4.8 ft in depth at the [facility] pool. Temp of water was 91.  Pt entered/exited the pool via stairs step to pattern independently with bilat rail.   Warm up Walking forward, back and side stepping 6-8 widths without UE support     Standing Stretching: -IT band stretching R/L 4 x 20 sec hold -hamstring and gastroc stretch using noodle 3 x 20 sec hold Strengthening LE and core: -Noodle kick downs supported by hand buoys 3 x 12 reps hip flex and external rotation -Hip adduction/abduction 2 x 10 reps.   -Hip flex2x10 - hip ext 2 x 10 -Kick board push downs 2 x 12 reps with manual perturbations by PT -Lateral kick board rotations R/L vc for increased submersion of board to increase resistance for core strengthening   Lunges x 4 widths.   Pt requires buoyancy for support and to offload joints with strengthening exercises. Viscosity of the water is needed for resistance of strengthening; water current perturbations provides challenge to standing balance unsupported, requiring increased core activation        Plan - 08/04/21 1348     Clinical Impression Statement Pt is being seen in aquatics every 2 weeks. She reports she has lost 20 lbs overall, needs to lose more to be a candidate for THR surgery.   MRI results indicate abdominal hernia is 12.7 cm in diameter.  Pt is anticipating heating from MD  soon on plans for surgical repair.  She reports overall hip pain has decreased.  She completes all prescribed exercises today without difficulty, some increase left hip discomfort with hip extension.  She is to begin water exercise her at [facility] soon.    Personal Factors and Comorbidities Time since onset of injury/illness/exacerbation;Fitness;Comorbidity 3+    Stability/Clinical Decision Making Evolving/Moderate complexity    Clinical Decision Making Moderate    Rehab Potential Good    PT Frequency 2x / week    PT Duration 8 weeks    PT Treatment/Interventions ADLs/Self Care Home Management;Aquatic Therapy;Cryotherapy;Electrical Stimulation;Moist Heat;Balance training;Therapeutic exercise;Therapeutic activities;Functional mobility training;Stair training;Gait training;Neuromuscular re-education;Patient/family education;Manual techniques;Taping;Dry needling;Passive range of motion    PT Home Exercise Plan [facility]             Patient will benefit from skilled therapeutic intervention in order to improve the following deficits and impairments:  Decreased strength, Postural dysfunction, Difficulty walking, Decreased activity tolerance, Decreased mobility, Decreased balance, Pain, Decreased endurance, Impaired perceived functional ability  Visit Diagnosis: Chronic pain of left knee  Muscle weakness (generalized)  Difficulty in walking, not elsewhere classified     Problem List Patient Active Problem List   Diagnosis Date Noted   COVID-19 virus infection 07/07/2021   Psoriasis 01/15/2021   Chronic pain of left knee 01/15/2021   Ventral hernia without obstruction or gangrene 01/15/2021   Class 3 severe obesity with serious comorbidity and body mass index (BMI) of 40.0 to 44.9 in adult [facility]) 01/15/2019   Acne rosacea 05/10/2016   Multinodular goiter 02/27/2016   Subclinical  thyrotoxicosis 02/26/2016   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 09/26/15 09/26/2015   Prediabetes 07/30/2015   Perforation of sigmoid colon ([place]) 02/20/2015   Diverticulitis of large intestine with abscess without bleeding 02/18/2015   OSA (obstructive sleep apnea) 05/09/2013   GASTROENTERITIS 08/18/2010   RAYNAUD'S SYNDROME 02/17/2009   PLANTAR FASCIITIS 02/17/2009   Asthma 06/12/2008   Hyperlipidemia 09/04/2007   DEVIATED SEPTUM 09/04/2007   ALLERGIC RHINITIS 09/04/2007   GERD 09/04/2007   OSTEOARTHRITIS 09/04/2007   HEADACHE 09/04/2007    [facility] Kidney ([place]) Noriel Guthrie MPT 08/04/2021, 1:54 PM  [facility] [address] [place], [place], [address] Phone: [phone]   Fax:  [phone]  Name: Shianne Zeiser [facility] MRN: [id number] Date of Birth: 22-Aug-1956

## 2021-08-18 ENCOUNTER — Other Ambulatory Visit: Payer: Self-pay

## 2021-08-18 ENCOUNTER — Encounter (HOSPITAL_BASED_OUTPATIENT_CLINIC_OR_DEPARTMENT_OTHER): Payer: BC Managed Care – PPO | Attending: Family Medicine | Admitting: Physical Therapy

## 2021-08-18 ENCOUNTER — Encounter (HOSPITAL_BASED_OUTPATIENT_CLINIC_OR_DEPARTMENT_OTHER): Payer: Self-pay | Admitting: Physical Therapy

## 2021-08-18 DIAGNOSIS — R2689 Other abnormalities of gait and mobility: Secondary | ICD-10-CM | POA: Insufficient documentation

## 2021-08-18 DIAGNOSIS — R2681 Unsteadiness on feet: Secondary | ICD-10-CM | POA: Diagnosis not present

## 2021-08-18 DIAGNOSIS — R293 Abnormal posture: Secondary | ICD-10-CM | POA: Diagnosis not present

## 2021-08-18 DIAGNOSIS — M25561 Pain in right knee: Secondary | ICD-10-CM | POA: Insufficient documentation

## 2021-08-18 DIAGNOSIS — M6281 Muscle weakness (generalized): Secondary | ICD-10-CM | POA: Diagnosis not present

## 2021-08-18 DIAGNOSIS — G8929 Other chronic pain: Secondary | ICD-10-CM | POA: Insufficient documentation

## 2021-08-18 DIAGNOSIS — R262 Difficulty in walking, not elsewhere classified: Secondary | ICD-10-CM | POA: Insufficient documentation

## 2021-08-18 NOTE — Therapy (Addendum)
[place], [place], [address] Phone: [phone]   Fax:  [phone]  Physical Therapy Treatment PHYSICAL THERAPY DISCHARGE SUMMARY  Visits from Start of Care: 01/28/21  Current functional level related to goals / functional outcomes: Indep with ADL and functional mobility   Remaining deficits: Pain and toleration to some activity   Education / Equipment: management of condition/HEP  Patient agrees to discharge. Patient goals were partially met. Patient is being discharged due to not returning since the last visit. Did not return for final visit to test and complete survey  Alison Best Alison Best) Alison Best MPT 01/18/22  Patient Details  Name: Alison Best MRN: [id number] Date of Birth: [birth year] Referring Provider (PT): Alison Morale, MD   Encounter Date: 08/18/2021   PT End of Session - 08/18/21 1040     Visit Number 28    Number of Visits 30    Date for PT Re-Evaluation 09/22/21    Authorization Type BCBS 30 VL    PT Start Time 1035    PT Stop Time 1115    PT Time Calculation (min) 40 min    Equipment Utilized During Treatment Other (comment)    Activity Tolerance Patient tolerated treatment well;Treatment limited secondary to medical complications (Comment)    Behavior During Therapy [facility] for tasks assessed/performed             Past Medical History:  Diagnosis Date   Anal fissure    Arthritis    Bursitis    Constipation    COPD (chronic obstructive pulmonary disease) ([place])    Diabetes mellitus without complication ([place])    sees Dr. Cruzita Lederer    Diverticulitis of colon 02/2015   Ectopic pregnancy    Emphysema of lung ([place] [facility])    Frozen shoulder    Heart murmur    Hernia, abdominal    Hyperlipidemia    Hyperthyroidism    Infertility, female    Irregular heartbeat    Joint pain    Obesity    Osteoarthritis    Sleep apnea    Swallowing difficulty    Thyroid disease     hyperthyroid    Past Surgical History:  Procedure Laterality Date   APPENDECTOMY     CESAREAN SECTION     COLONOSCOPY  09-10-10   per Dr. Acquanetta Sit, diverticulosis of descending colon and sigmoid, internal hemorrhoids, repeat in 10 yrs   COLOSTOMY N/A 02/20/2015   Procedure: COLOSTOMY;  Surgeon: Jackolyn Confer, MD;  Location: [facility];  Service: General;  Laterality: N/A;   COLOSTOMY REVISION  02/20/2015   Procedure: Sigmoid Colectomy;  Surgeon: Jackolyn Confer, MD;  Location: [facility];  Service: General;;   CYSTOSCOPY N/A 02/20/2015   Procedure: Consuela Mimes;  Surgeon: Kathie Rhodes, MD;  Location: [facility];  Service: Urology;  Laterality: N/A;   DILATION AND CURETTAGE OF UTERUS     x3   ILEO LOOP COLOSTOMY CLOSURE N/A 09/26/2015   Procedure: LAPAROSCOPIC LYSIS OF ADHESIONS, COLOSTOMY CLOSURE;  Surgeon: Jackolyn Confer, MD;  Location: [facility];  Service: General;  Laterality: N/A;   LAPAROTOMY N/A 02/20/2015   Procedure: Emergency EXPLORATORY and drainiage of interabdominal abcesses;  Surgeon: Jackolyn Confer, MD;  Location: [facility];  Service: General;  Laterality: N/A;   SALPINGOOPHORECTOMY Left 02/20/2015   Procedure: SALPINGO OOPHORECTOMY;  Surgeon: Jackolyn Confer, MD;  Location: [facility];  Service: General;  Laterality: Left;   TONSILLECTOMY  1980    There were no vitals filed for this  visit.   Subjective Assessment - 08/18/21 1233     Subjective "I am being referred to a specialist for my hernia probably at [name]"    Currently in Pain? Yes    Pain Score 1     Pain Orientation Left    Pain Descriptors / Indicators Aching;Sore    Pain Type Chronic pain    Pain Onset More than a month ago    Pain Frequency Intermittent                                          PT Short Term Goals - 08/18/21 1053       PT SHORT TERM GOAL #1   Baseline Indep with aquatics HEP/lamenated.               PT Long Term Goals - 08/18/21 1054       PT LONG TERM GOAL #1    Baseline alternating 0n 4 steps submerged.  Unable to complete at home due to no handrail at this time.  Handrail will be installed.    Status On-going            TREATMENT Pt seen for aquatic therapy today.  Treatment took place in water 3.25-4.8 ft in depth at the [facility] pool. Temp of water was 94.  Pt entered/exited the pool via stairs step to pattern independently with bilat rail.   Warm up Walking forward, back and side stepping 6-8 widths without UE support   Reviewed all exercises as we completed to ensure understanding and indep/HEP    Standing Stretching: -IT band stretching R/L 4 x 20 sec hold -hamstring and gastroc stretch using noodle 3 x 20 sec hold Strengthening LE and core: -Noodle kick downs supported by hand buoys 3 x 12 reps hip flex and external rotation -Hip adduction/abduction 2 x 10 reps.   -Hip flex2x10 - hip ext 2 x 10 -Kick board push downs 2 x 12 reps with manual perturbations by PT -Lateral kick board rotations R/L vc for increased submersion of board to increase resistance for core strengthening   Lunges x 4 widths.   Pt requires buoyancy for support and to offload joints with strengthening exercises. Viscosity of the water is needed for resistance of strengthening; water current perturbations provides challenge to standing balance unsupported, requiring increased core activation        Plan - 08/18/21 1056     Clinical Impression Statement Pts last aquatic session.  HEP completed and will be lamenated for pt to pick up from office. She is instructed through all exercise son program to ensure understanding and indep. She will be seeing a hernia specialist to resolve incisional abdominal hernia as well as following up with ortho for left hip OA.  Pt completes all exercises today with some discomfort in left hip.  She does demonstrate improvement with stair climbing submerged rising 4 steps alternating.Marland Kitchen  Next PT visit is on land.     Stability/Clinical Decision Making Evolving/Moderate complexity    Clinical Decision Making Moderate    Rehab Potential Good    PT Frequency 2x / week    PT Duration 8 weeks    PT Treatment/Interventions ADLs/Self Care Home Management;Aquatic Therapy;Cryotherapy;Electrical Stimulation;Moist Heat;Balance training;Therapeutic exercise;Therapeutic activities;Functional mobility training;Stair training;Gait training;Neuromuscular re-education;Patient/family education;Manual techniques;Taping;Dry needling;Passive range of motion    PT Home Exercise Plan [facility] aquatics added  Patient will benefit from skilled therapeutic intervention in order to improve the following deficits and impairments:  Decreased strength, Postural dysfunction, Difficulty walking, Decreased activity tolerance, Decreased mobility, Decreased balance, Pain, Decreased endurance, Impaired perceived functional ability  Visit Diagnosis: Abnormal posture  Chronic pain of right knee  Difficulty in walking, not elsewhere classified  Muscle weakness (generalized)  Unsteadiness on feet  Other abnormalities of gait and mobility     Problem List Patient Active Problem List   Diagnosis Date Noted   COVID-19 virus infection 07/07/2021   Psoriasis 01/15/2021   Chronic pain of left knee 01/15/2021   Ventral hernia without obstruction or gangrene 01/15/2021   Class 3 severe obesity with serious comorbidity and body mass index (BMI) of 40.0 to 44.9 in adult [facility]) 01/15/2019   Acne rosacea 05/10/2016   Multinodular goiter 02/27/2016   Subclinical thyrotoxicosis 02/26/2016   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 [place], [place], [address] Phone: [phone]   Fax:  [phone]  Name: Alison Best MRN: [id number] Date of Birth: 1956-02-02

## 2021-08-26 ENCOUNTER — Ambulatory Visit: Payer: BC Managed Care – PPO | Admitting: Professional

## 2021-08-26 DIAGNOSIS — F4321 Adjustment disorder with depressed mood: Secondary | ICD-10-CM

## 2021-09-01 ENCOUNTER — Encounter (HOSPITAL_BASED_OUTPATIENT_CLINIC_OR_DEPARTMENT_OTHER): Payer: Self-pay

## 2021-09-01 ENCOUNTER — Ambulatory Visit (HOSPITAL_BASED_OUTPATIENT_CLINIC_OR_DEPARTMENT_OTHER): Payer: BC Managed Care – PPO | Admitting: Physical Therapy

## 2021-09-09 DIAGNOSIS — Z6841 Body Mass Index (BMI) 40.0 and over, adult: Secondary | ICD-10-CM | POA: Diagnosis not present

## 2021-09-09 DIAGNOSIS — K432 Incisional hernia without obstruction or gangrene: Secondary | ICD-10-CM | POA: Diagnosis not present

## 2021-09-15 ENCOUNTER — Telehealth: Payer: BC Managed Care – PPO | Admitting: Family Medicine

## 2021-09-15 DIAGNOSIS — J0140 Acute pansinusitis, unspecified: Secondary | ICD-10-CM

## 2021-09-15 MED ORDER — AMOXICILLIN 500 MG PO CAPS: 500.0000 mg | ORAL_CAPSULE | Freq: Two times a day (BID) | ORAL | 0 refills | Status: AC

## 2021-09-15 NOTE — Progress Notes (Signed)
E-Visit for Sinus Problems  We are sorry that you are not feeling well.  Here is how we plan to help!  Based on what you have shared with me it looks like you have sinusitis.  Sinusitis is inflammation and infection in the sinus cavities of the head.  Based on your presentation I believe you most likely have Acute Bacterial Sinusitis.  This is an infection caused by bacteria and is treated with antibiotics. I have prescribed Amoxicillin 500 mg twice daily for 10 days You may use an oral decongestant such as Mucinex D or if you have glaucoma or high blood pressure use plain Mucinex. Saline nasal spray help and can safely be used as often as needed for congestion.  If you develop worsening sinus pain, fever or notice severe headache and vision changes, or if symptoms are not better after completion of antibiotic, please schedule an appointment with a health care provider.    Sinus infections are not as easily transmitted as other respiratory infection, however we still recommend that you avoid close contact with loved ones, especially the very young and elderly.  Remember to wash your hands thoroughly throughout the day as this is the number one way to prevent the spread of infection!  Home Care: Only take medications as instructed by your medical team. Complete the entire course of an antibiotic. Do not take these medications with alcohol. A steam or ultrasonic humidifier can help congestion.  You can place a towel over your head and breathe in the steam from hot water coming from a faucet. Avoid close contacts especially the very young and the elderly. Cover your mouth when you cough or sneeze. Always remember to wash your hands.  Get Help Right Away If: You develop worsening fever or sinus pain. You develop a severe head ache or visual changes. Your symptoms persist after you have completed your treatment plan.  Make sure you Understand these instructions. Will watch your condition. Will  get help right away if you are not doing well or get worse.  Thank you for choosing an e-visit.  Your e-visit answers were reviewed by a board certified advanced clinical practitioner to complete your personal care plan. Depending upon the condition, your plan could have included both over the counter or prescription medications.  Please review your pharmacy choice. Make sure the pharmacy is open so you can pick up prescription now. If there is a problem, you may contact your provider through [employer] and have the prescription routed to another pharmacy.  Your safety is important to [name]. If you have drug allergies check your prescription carefully.   For the next 24 hours you can use MyChart to ask questions about today's visit, request a non-urgent call back, or ask for a work or school excuse. You will get an email in the next two days asking about your experience. I hope that your e-visit has been valuable and will speed your recovery.  I provided 5 minutes of non face-to-face time during this encounter for chart review, medication and order placement, as well as and documentation.

## 2021-10-07 ENCOUNTER — Encounter: Payer: Self-pay | Admitting: Professional

## 2021-10-07 ENCOUNTER — Ambulatory Visit (INDEPENDENT_AMBULATORY_CARE_PROVIDER_SITE_OTHER): Payer: BC Managed Care – PPO | Admitting: Professional

## 2021-10-07 DIAGNOSIS — F4321 Adjustment disorder with depressed mood: Secondary | ICD-10-CM

## 2021-10-07 NOTE — Progress Notes (Signed)
[place] Counselor/Therapist Progress Note  Patient ID: Alison Best, MRN: [id number],    Date: 10/07/2021  Time Spent: 50 minutes 1002-1052 am  Treatment Type: Individual Therapy  Reported Symptoms: tearful and grieving, feels positive  Mental Status Exam: Appearance:  Well Groomed     Behavior: Sharing  Motor: Normal  Speech/Language:  Clear and Coherent and Normal Rate  Affect: Full Range  Mood: labile  Thought process: goal directed  Thought content:   WNL  Sensory/Perceptual disturbances:   WNL  Orientation: oriented to person, place, time/date, and situation  Attention: Good  Concentration: Good  Memory: WNL  Fund of knowledge:  Good  Insight:   Good  Judgment:  Good  Impulse Control: Good   Risk Assessment: Danger to Self:  No Self-injurious Behavior: No Danger to Others: No Duty to Warn:no Physical Aggression / Violence:No  Access to Firearms a concern: No  Gang Involvement:No   Subjective: This session was held via video teletherapy due to the coronavirus risk at this time. The patient consented to video teletherapy and was located at her home during this session. She is aware it is the responsibility of the patient to secure confidentiality on her end of the session. The provider was in a private home office for the duration of this session.   The patient arrived on time for her webex appointment appearing nicely groomed and easily engaged.  Issues addressed: 1-physical health -needs to have hip surgery -has a sizable hernia that she will then need surgery -she must lose weight prior to any surgery   -has lost three pounds -pt doing intermittent fasting -educated on mindful eating 2-grief -pt tearfully shared about the loss of her maternal aunt -admitted that she did not take the time to grieve last Christmas because she was focused on her retirement -going through her mother's things and trying to make meaningful  donations -packing a Christmas box for each of her three brothers   -putting something memorable from her parents boxes she is going through -discussed strategies for maintaining memories without holding onto items -redefining herself 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

## 2021-10-23 ENCOUNTER — Other Ambulatory Visit: Payer: Self-pay | Admitting: Cardiovascular Disease

## 2021-11-11 DIAGNOSIS — M25552 Pain in left hip: Secondary | ICD-10-CM | POA: Diagnosis not present

## 2021-11-11 DIAGNOSIS — M1612 Unilateral primary osteoarthritis, left hip: Secondary | ICD-10-CM | POA: Diagnosis not present

## 2021-11-18 ENCOUNTER — Encounter: Payer: Self-pay | Admitting: Professional

## 2021-11-18 ENCOUNTER — Ambulatory Visit (INDEPENDENT_AMBULATORY_CARE_PROVIDER_SITE_OTHER): Payer: BC Managed Care – PPO | Admitting: Professional

## 2021-11-18 DIAGNOSIS — F4321 Adjustment disorder with depressed mood: Secondary | ICD-10-CM

## 2021-11-18 NOTE — Progress Notes (Signed)
[place] Counselor/Therapist Progress Note  Patient ID: Alison Best, MRN: [id number],    Date: 11/18/2021  Time Spent: 56 minutes 08-1155 am  Treatment Type: Individual Therapy  Reported Symptoms: positive   Mental Status Exam: Appearance:  Well Groomed     Behavior: Sharing  Motor: Normal  Speech/Language:  Clear and Coherent and Normal Rate  Affect: Full Range  Mood: labile  Thought process: goal directed  Thought content:   WNL  Sensory/Perceptual disturbances:   WNL  Orientation: oriented to person, place, time/date, and situation  Attention: Good  Concentration: Good  Memory: WNL  Fund of knowledge:  Good  Insight:   Good  Judgment:  Good  Impulse Control: Good   Risk Assessment: Danger to Self:  No Self-injurious Behavior: No Danger to Others: No Duty to Warn:no Physical Aggression / Violence:No  Access to Firearms a concern: No  Gang Involvement:No   Subjective: This session was held via video teletherapy due to the coronavirus risk at this time. The patient consented to video teletherapy and was located at her home during this session. She is aware it is the responsibility of the patient to secure confidentiality on her end of the session. The provider was in a private home office for the duration of this session.   The patient arrived on time for her webex appointment appearing nicely groomed and easily engaged.  Issues addressed: 1-physical health -down 19 lb -only 9 lbs to be under BMI to have surgery -she will be scheduled for hip surgery as soon as weight down -pt taking collagen and doing intermittent fasting   -has more energy   -not having as much ortho pain   -is getting her home cleaned out -being more regular with taking her [employer] -insurance coverage from previous employer will end January 2023   -wants surgery prior to end date -hernia will be prior to June 2-son Jeremy -"ongoing saga of helping our  son" -shifted strategy of helping son   -more frequent contact with him   -helping him to find a new place to live   -helping him with grocery shopping and meals   -  -has helped him improve physically -he had not been eating enough and she thinks he is getting healthier   -he has freed himself from narrow focus related to food -the frequent contact appears to be healthy for him -pt decided to embrace the challenge -Ysidro Evert sees a naturopath in [place] -he lost his housing because he cussed out the apartment staff in an email -help vs. enabling 3-self-care -support groups -ensuring your needs are met first  Problems Addressed  Anxiety, Eating Disorders And Obesity, Medical Issues Goals 1. Accept the illness, and adapt life to the necessary limitations. Objective Engage in social, productive, and recreational activities that are possible in spite of medical condition. Target Date: 2022-06-02 Frequency: Monthly  Progress: 0 Modality: individual  Related Interventions Sort out with the client activities that he/she can still enjoy either alone or with others (or assign "Identify and Schedule Pleasant Activities" in the Adult Psychotherapy Homework Planner by [name] Gulling). Objective Learn and implement skills for preventing lapses back into more stressful reactions. Target Date: 2022-06-02 Frequency: Monthly  Progress: 0 Modality: individual  Related Interventions Teach the client relapse prevention skills including distinguishing between a lapse and relapse, identifying and rehearsing the management of high-risk situations using skills learned in therapy, building a less stressful lifestyle, and periodically attending "booster" sessions of therapy. Objective Demonstrate mastery of  coping skills by applying them to daily life situations. Target Date: 2022-06-02 Frequency: Monthly  Progress: 0 Modality: individual  Related Interventions Help the client internalize his/her new skill set  and build self-efficacy by ensuring that the client "takes credit" for improvement and makes self-attributions for change. Objective Implement positive imagery as a means of triggering peace of mind and reducing tension. Target Date: 2022-06-02 Frequency: Monthly  Progress: 0 Modality: individual  Related Interventions Encourage the client to rely on faith-based promises of God's love, presence, caring, and support to bring peace of mind. Teach the client the use of positive, relaxing, healing imagery to reduce stress and promote peace of mind. Objective Identify sources of emotional distress that could have a negative impact on physical health. Target Date: 2022-06-02 Frequency: Monthly  Progress: 0 Modality: individual  Related Interventions Assign the client to make a list of lifestyle changes he/she could make to help maintain physical health; process list. 2. Become as knowledgeable as possible about the diagnosed condition and about living as normally as possible. 3. Develop coping strategies (e.g., feeling identification, problem-solving, assertiveness) to address emotional issues that could lead to relapse of the eating disorder. 4. Develop healthy cognitive patterns and beliefs about self that lead to positive identity and prevent a relapse of the eating disorder. 5. Develop healthy interpersonal relationships that lead to alleviation and help prevent the relapse of the eating disorder. Objective Verbalize an understanding of relapse prevention and the distinction between a lapse and a relapse. Target Date: 2022-06-02 Frequency: Monthly  Progress: 0 Modality: individual  Related Interventions Identify with the client future situations or circumstances in which lapses could occur. Discuss with the client the distinction between a lapse and relapse, associating a lapse with an initial and reversible return of distress, urges, or to avoid, and relapse with the decision to return to the  cycle of maladaptive thoughts and actions (e.g., feeling anxious, binging, then purging). Objective Implement relapse prevention strategies for managing possible future anxiety symptoms. Target Date: 2022-06-02 Frequency: Monthly  Progress: 0 Modality: individual  Related Interventions Develop a "maintenance plan" with the client that describes how the client plans to identify challenges, use knowledge and skills learned in therapy to manage them, and maintain positive changes gained in therapy. 6. Enhance ability to effectively cope with the full variety of life's worries and anxieties. Objective Learn and implement personal and interpersonal skills to reduce anxiety and improve interpersonal relationships. Target Date: 2022-06-02 Frequency: Monthly  Progress: 0 Modality: individual  Related Interventions Use instruction, modeling, and role-playing to build the client's general social, communication, and/or conflict resolution skills. Objective Learn to accept limitations in life and commit to tolerating, rather than avoiding, unpleasant emotions while accomplishing meaningful goals. Target Date: 2022-06-02 Frequency: Monthly  Progress: 0 Modality: individual  Related Interventions Use techniques from Acceptance and Commitment Therapy to help client accept uncomfortable realities such as lack of complete control, imperfections, and uncertainty and tolerate unpleasant emotions and thoughts in order to accomplish value-consistent goals. Objective Learn and implement relapse prevention strategies for managing possible future anxiety symptoms. Target Date: 2022-06-02 Frequency: Monthly  Progress: 0 Modality: individual  Related Interventions Develop a "coping card" on which coping strategies and other important information (e.g., "Breathe deeply and relax," "Challenge unrealistic worries," "Use problem-solving") are written for the client's later use. Instruct the client to routinely use new  therapeutic skills (e.g., relaxation, cognitive restructuring, exposure, and problem-solving) in daily life to address emergent worries, anxiety, and avoidant tendencies. Identify and rehearse  with the client the management of future situations or circumstances in which lapses could occur. Discuss with the client the distinction between a lapse and relapse, associating a lapse with an initial and reversible return of worry, anxiety symptoms, or urges to avoid, and relapse with the decision to continue the fearful and avoidant patterns. 7. Learn and implement coping skills that result in a reduction of anxiety and worry, and improved daily functioning. 8. Medically stabilize physical condition. 9. Resolve the core conflict that is the source of anxiety. 10. Terminate overeating and implement lifestyle changes that lead to weight loss and improved health.  Diagnosis:Adjustment disorder with depressed mood  Plan:  -meet again on Wednesday, December 30, 2021 at 12 pm  [place], [place], [facility]

## 2021-11-22 NOTE — Progress Notes (Signed)
Chief Complaint  Patient presents with   Follow-up    Atrial fibrillation    History of Present Illness: 66 yo female with history of paroxysmal atrial fibrillation, hyperlipidemia, sleep apnea, hyperthyroidism, carotid artery disease and former tobacco abuse who is here today for cardiac follow up. I saw her as a new patient July 2016 for evaluation of abnormal EKG. She was hospitalized at [facility] May 2016 with perforated sigmoid diverticulitis with intra-abdominal abscesses and underwent emergency exploratory laparotomy and drainage of intra-abdominal abscesses, salpingo oophorectomy, descending colostomy, sigmoid colectomy, cystoscopy. During her hospitalization her EKG showed T wave inversions and ST depression. I saw her in the office in July 2016 and arranged an echo and a holter monitor. Echo with normal LV function and TR with no other valve disease. Stress myoview 05/28/15 with no ischemia. When she presented for her echo she was in rapid atrial fib. She was started on Toprol and ASA given CHADS VASC score of 1. Carotid dopplers November 2020 with mild bilateral carotid disease.  She did not tolerate beta blockers due to fatigue. She has had no known recurrence of atrial fibrillation since 2016. Cardiac monitor March 2021 with PACs, PVCs, atrial tachycardia. Echo April 2021 with LVEF=60-65%, no significant valve disease.   She is here today for follow up. The patient denies any chest pain, dyspnea, palpitations, lower extremity edema, orthopnea, PND, dizziness, near syncope or syncope. Upcoming hip replacement with Dr. Alvan Dame.    Primary Care Physician: Laurey Morale, MD  Past Medical History:  Diagnosis Date   Anal fissure    Arthritis    Bursitis    Constipation    COPD (chronic obstructive pulmonary disease) ([place])    Diabetes mellitus without complication [facility])    sees Dr. Cruzita Lederer    Diverticulitis of colon 02/2015   Ectopic pregnancy    Emphysema of lung ([place])    Frozen shoulder     Heart murmur    Hernia, abdominal    Hyperlipidemia    Hyperthyroidism    Infertility, female    Irregular heartbeat    Joint pain    Obesity    Osteoarthritis    Sleep apnea    Swallowing difficulty    Thyroid disease    hyperthyroid    Past Surgical History:  Procedure Laterality Date   APPENDECTOMY     CESAREAN SECTION     COLONOSCOPY  09-10-10   per Dr. Acquanetta Sit, diverticulosis of descending colon and sigmoid, internal hemorrhoids, repeat in 10 yrs   COLOSTOMY N/A 02/20/2015   Procedure: COLOSTOMY;  Surgeon: Jackolyn Confer, MD;  Location: [facility];  Service: General;  Laterality: N/A;   COLOSTOMY REVISION  02/20/2015   Procedure: Sigmoid Colectomy;  Surgeon: Jackolyn Confer, MD;  Location: [facility];  Service: General;;   CYSTOSCOPY N/A 02/20/2015   Procedure: Consuela Mimes;  Surgeon: Kathie Rhodes, MD;  Location: [facility];  Service: Urology;  Laterality: N/A;   DILATION AND CURETTAGE OF UTERUS     x3   ILEO LOOP COLOSTOMY CLOSURE N/A 09/26/2015   Procedure: LAPAROSCOPIC LYSIS OF ADHESIONS, COLOSTOMY CLOSURE;  Surgeon: Jackolyn Confer, MD;  Location: [facility];  Service: General;  Laterality: N/A;   LAPAROTOMY N/A 02/20/2015   Procedure: Emergency EXPLORATORY and drainiage of interabdominal abcesses;  Surgeon: Jackolyn Confer, MD;  Location: [facility];  Service: General;  Laterality: N/A;   SALPINGOOPHORECTOMY Left 02/20/2015   Procedure: SALPINGO OOPHORECTOMY;  Surgeon: Jackolyn Confer, MD;  Location: [facility];  Service: General;  Laterality: Left;   TONSILLECTOMY  1980    Current Outpatient Medications  Medication Sig Dispense Refill   acetaminophen (TYLENOL) 325 MG tablet Take 2 tablets (650 mg total) by mouth every 6 (six) hours as needed for moderate pain or headache.     acetaZOLAMIDE ER (DIAMOX) 500 MG capsule Take 1 capsule (500 mg total) by mouth 2 (two) times daily. When travelling at high altitudes 60 capsule 2   betamethasone dipropionate 0.05 % cream Apply topically 2 (two)  times daily. 45 g 2   Coenzyme Q10 (CO Q 10 PO) Take 1 capsule by mouth daily.     COLLAGEN PO Take by mouth daily.     diclofenac (VOLTAREN) 75 MG EC tablet Take 1 tablet (75 mg total) by mouth 2 (two) times daily. 180 tablet 3   fenofibrate (TRICOR) 145 MG tablet Take 1 tablet (145 mg total) by mouth daily. 90 tablet 3   glucose blood (CONTOUR NEXT TEST) test strip Use to check blood sugar once a day. 100 each 12   metFORMIN (GLUCOPHAGE) 500 MG tablet Take 1 tablet (500 mg total) by mouth 2 (two) times daily. 180 tablet 3   metroNIDAZOLE (METROGEL) 1 % gel APPLY EXTERNALLY TO THE AFFECTED AREA DAILY 60 g 11   Microlet Lancets MISC USE TO CHECK BLOOD SUGAR ONCE DAILY 100 each 0   Multiple Vitamins-Minerals (MULTIVITAMIN WITH MINERALS) tablet Take 1 tablet by mouth daily.     UNABLE TO FIND Take 600 mg by mouth daily. Med Name: Modere Opti Pack     atorvastatin (LIPITOR) 20 MG tablet TAKE 1 TABLET(20 MG) BY MOUTH DAILY (Patient not taking: Reported on 11/23/2021) 30 tablet 0   diltiazem (CARDIZEM CD) 120 MG 24 hr capsule Take 1 capsule (120 mg total) by mouth daily. 90 capsule 3   No current facility-administered medications for this visit.    Allergies  Allergen Reactions   Levofloxacin In D5w Other (See Comments)    Achilles tendon weakness   Mangifera Indica Anaphylaxis   Mango Flavor Swelling   Bupropion Nausea And Vomiting   Levaquin [Levofloxacin] Other (See Comments)    Severe tendonitis Tolerates Cipro    Social History   Socioeconomic History   Marital status: Married    Spouse name: Not on file   Number of children: 1   Years of education: Not on file   Highest education level: Not on file  Occupational History   Occupation: Equities trader at [employer]    Employer: [employer]   Smoking status: Former    Packs/day: 0.25    Years: 40.00    Pack years: 10.00    Types: Cigarettes   Smokeless tobacco: Never   Tobacco  comments:    recently quit smoking, 2 mos ago  Vaping Use   Vaping Use: Never used  Substance and Sexual Activity   Alcohol use: Yes    Alcohol/week: 0.0 standard drinks    Comment: rare   Drug use: No   Sexual activity: Not Currently    Partners: Male    Comment: 1ST intercourse- 15, partners- 68,  married x 32 yrs   Other Topics Concern   Not on file  Social History Narrative   Not on file   Social Determinants of Health   Financial Resource Strain: Not on file  Food Insecurity: Not on file  Transportation Needs: Not on file  Physical Activity: Not on file  Stress: Not on file  Social Connections: Not on file  Intimate Partner Violence: Not on file    Family History  Problem Relation Age of Onset   Breast cancer Mother    High blood pressure Mother    Kidney disease Mother    Sleep apnea Mother    Obesity Mother    CAD Father        CABG   Skin cancer Father    Lung cancer Father    Diverticulitis Father    Colon polyps Father    High blood pressure Father    Stroke Father    Heart disease Father    Sleep apnea Father    COPD Other    Cancer Other    Heart disease Other    Stroke Other    Allergies Brother    Diabetes Brother    Breast cancer Maternal Grandmother    Heart attack Maternal Grandfather    Heart attack Paternal Grandfather    Hypertension Neg Hx     Review of Systems:  As stated in the HPI and otherwise negative.   BP 122/76    Pulse 74    Ht 4\' 11"  (1.499 m)    Wt 211 lb 3.2 oz (95.8 kg)    LMP 03/22/2012    SpO2 97%    BMI 42.66 kg/m   Physical Examination:  General: Well developed, well nourished, NAD  HEENT: OP clear, mucus membranes moist  SKIN: warm, dry. No rashes. Neuro: No focal deficits  Musculoskeletal: Muscle strength 5/5 all ext  Psychiatric: Mood and affect normal  Neck: No JVD, no carotid bruits, no thyromegaly, no lymphadenopathy.  Lungs:Clear bilaterally, no wheezes, rhonci, crackles Cardiovascular: Regular rate  and rhythm. No murmurs, gallops or rubs. Abdomen:Soft. Bowel sounds present. Non-tender.  Extremities: No lower extremity edema. Pulses are 2 + in the bilateral DP/PT.  Echo April 2021:  1. Left ventricular ejection fraction, by estimation, is 60 to 65%. The  left ventricle has normal function. The left ventricle has no regional  wall motion abnormalities. Left ventricular diastolic parameters were  normal.   2. Right ventricular systolic function is normal. The right ventricular  size is normal. There is mildly elevated pulmonary artery systolic  pressure.   3. Left atrial size was mildly dilated.   4. The mitral valve is normal in structure. Trivial mitral valve  regurgitation. No evidence of mitral stenosis.   5. The aortic valve is tricuspid. Aortic valve regurgitation is not  visualized. Mild aortic valve sclerosis is present, with no evidence of  aortic valve stenosis.   6. The inferior vena cava is normal in size with greater than 50%  respiratory variability, suggesting right atrial pressure of 3 mmHg.   EKG:  EKG is ordered today. The ekg ordered today demonstrates Sinus  Recent Labs: 01/27/2021: ALT 25; Hemoglobin 14.9; Platelets 274.0 04/03/2021: BUN 20; Creat 1.06; Potassium 3.9; Sodium 143 05/06/2021: TSH 2.12    Wt Readings from Last 3 Encounters:  11/23/21 211 lb 3.2 oz (95.8 kg)  04/03/21 220 lb (99.8 kg)  02/26/21 225 lb 9.6 oz (102.3 kg)     Other studies Reviewed: Additional studies/ records that were reviewed today include: . Review of the above records demonstrates:    Assessment and Plan:   1. Paroxysmal atrial fibrillation: This occurred in the setting of hyperthyroidism. No recurrence since then. She has not been on long term anti-coagulation. No palpitations. Continue Cardizem.   2. Carotid disease: Mild bilateral disease by dopplers November  2020. Will repeat in 2023.  3. Tobacco abuse: Smoking cessation is advised.   4. Pre-operative  cardiovascular risk assessment: She has no concerning symptoms. No CHF or angina. She can proceed with her planned surgical procedure.   Current medicines are reviewed at length with the patient today.  The patient does not have concerns regarding medicines.  The following changes have been made:  no change  Labs/ tests ordered today include:   Orders Placed This Encounter  Procedures   EKG 12-Lead    Disposition:   F/U with me in 12 months  Signed, Lauree Chandler, MD 11/23/2021 10:49 AM    [facility] [address], [place], [place]  [address] Phone: [phone]; Fax: [phone]

## 2021-11-23 ENCOUNTER — Encounter: Payer: Self-pay | Admitting: Cardiovascular Disease

## 2021-11-23 ENCOUNTER — Other Ambulatory Visit: Payer: Self-pay

## 2021-11-23 ENCOUNTER — Ambulatory Visit: Payer: BC Managed Care – PPO | Admitting: Cardiovascular Disease

## 2021-11-23 VITALS — BP 122/76 | HR 74 | Ht 59.0 in | Wt 211.2 lb

## 2021-11-23 DIAGNOSIS — Z720 Tobacco use: Secondary | ICD-10-CM | POA: Diagnosis not present

## 2021-11-23 DIAGNOSIS — I480 Paroxysmal atrial fibrillation: Secondary | ICD-10-CM

## 2021-11-23 DIAGNOSIS — R0989 Other specified symptoms and signs involving the circulatory and respiratory systems: Secondary | ICD-10-CM | POA: Diagnosis not present

## 2021-11-23 MED ORDER — DILTIAZEM HCL ER COATED BEADS 120 MG PO CP24: 120.0000 mg | ORAL_CAPSULE | Freq: Every day | ORAL | 3 refills | Status: DC

## 2021-11-23 NOTE — Patient Instructions (Signed)
Medication Instructions:  Your physician recommends that you continue on your current medications as directed. Please refer to the Current Medication list given to you today.  *If you need a refill on your cardiac medications before your next appointment, please call your pharmacy*   Lab Work: None ordered   If you have labs (blood work) drawn today and your tests are completely normal, you will receive your results only by: [place] (if you have MyChart) OR A paper copy in the mail If you have any lab test that is abnormal or we need to change your treatment, we will call you to review the results.   Testing/Procedures: None ordered    Follow-Up: At [facility], you and your health needs are our priority.  As part of our continuing mission to provide you with exceptional heart care, we have created designated Provider Care Teams.  These Care Teams include your primary Cardiologist (physician) and Advanced Practice Providers (APPs -  Physician Assistants and Nurse Practitioners) who all work together to provide you with the care you need, when you need it.  We recommend signing up for the patient portal called "MyChart".  Sign up information is provided on this After Visit Summary.  MyChart is used to connect with patients for Virtual Visits (Telemedicine).  Patients are able to view lab/test results, encounter notes, upcoming appointments, etc.  Non-urgent messages can be sent to your provider as well.   To learn more about what you can do with MyChart, go to [email or web address].    Your next appointment:   12 month(s)  The format for your next appointment:   In Person  Provider:   Lauree Chandler, MD     Other Instructions None

## 2021-12-30 ENCOUNTER — Encounter: Payer: Self-pay | Admitting: Professional

## 2021-12-30 ENCOUNTER — Ambulatory Visit (INDEPENDENT_AMBULATORY_CARE_PROVIDER_SITE_OTHER): Payer: BC Managed Care – PPO | Admitting: Professional

## 2021-12-30 DIAGNOSIS — F4321 Adjustment disorder with depressed mood: Secondary | ICD-10-CM
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
## 2022-01-03 ENCOUNTER — Other Ambulatory Visit: Payer: Self-pay | Admitting: Family Medicine

## 2022-01-03 DIAGNOSIS — M1990 Unspecified osteoarthritis, unspecified site: Secondary | ICD-10-CM

## 2022-01-16 ENCOUNTER — Encounter: Payer: Self-pay | Admitting: Cardiovascular Disease

## 2022-01-16 ENCOUNTER — Encounter: Payer: Self-pay | Admitting: Family Medicine

## 2022-01-18 ENCOUNTER — Telehealth: Payer: Self-pay

## 2022-01-18 ENCOUNTER — Telehealth: Payer: Self-pay | Admitting: *Deleted

## 2022-01-18 NOTE — Telephone Encounter (Signed)
? ?  Pre-operative Risk Assessment  ?  ?Patient Name: Alison Best  ?DOB: 03/31/1956 ?MRN: [id number]  ? ?  ? ?Request for Surgical Clearance   ? ?Procedure:   LEFT TOTAL HIP ARTHOPLASTY  ? ?Date of Surgery:  Clearance 02/23/22                              ?   ?Surgeon:  DR. MATTHEW OLIN ?Surgeon's Group or Practice Name:  [facility] ?Phone number:  [phone] ?Fax number:  [phone] ?  ?Type of Clearance Requested:   ?- Medical  ?  ?Type of Anesthesia:  Spinal ?  ?Additional requests/questions:   ? ?Signed, ?Jacinta Shoe   ?01/18/2022, 5:02 PM  ? ?

## 2022-01-18 NOTE — Telephone Encounter (Signed)
Pt sent MY CHART message that she is having hip surgery and that the surgeon office will be sending over a clearance request. ?

## 2022-01-19 NOTE — Telephone Encounter (Signed)
? ?  Patient Name: Alison Best [facility]  ?DOB: November 24, 1955 ?MRN: [id number] ? ?Primary Cardiologist: Lauree Chandler, MD ? ?Chart reviewed as part of pre-operative protocol coverage. She was last seen 11/23/21 by Dr. Angelena Form. He evaluated her for preoperative risk assessment and his note states, "She has no concerning symptoms. No CHF or angina. She can proceed with her planned surgical procedure." I reached out to patient for update to ensure no new issues. The patient affirms she has been doing well without any new cardiac symptoms. Therefore the clearance above still holds. The patient was advised that if she develops new symptoms prior to surgery to contact our office to arrange for a follow-up visit, and she verbalized understanding. ? ?Will route this bundled recommendation and his office note to requesting provider via Epic fax function. Please call with questions. ? ?Charlie Pitter, PA-C ?01/19/2022, 12:00 PM ? ? ?

## 2022-01-20 ENCOUNTER — Telehealth: Payer: BC Managed Care – PPO | Admitting: Physician Assistant

## 2022-01-20 DIAGNOSIS — B9689 Other specified bacterial agents as the cause of diseases classified elsewhere: Secondary | ICD-10-CM

## 2022-01-20 DIAGNOSIS — J019 Acute sinusitis, unspecified: Secondary | ICD-10-CM

## 2022-01-20 MED ORDER — AMOXICILLIN-POT CLAVULANATE 875-125 MG PO TABS: 1.0000 | ORAL_TABLET | Freq: Two times a day (BID) | ORAL | 0 refills | Status: DC

## 2022-01-20 NOTE — Telephone Encounter (Signed)
Have not received the form, awaiting for the form ?

## 2022-01-20 NOTE — Progress Notes (Signed)
E-Visit for Sinus Problems ? ?We are sorry that you are not feeling well.  Here is how we plan to help! ? ?Based on what you have shared with me it looks like you have sinusitis.  Sinusitis is inflammation and infection in the sinus cavities of the head.  Based on your presentation I believe you most likely have Acute Bacterial Sinusitis.  This is an infection caused by bacteria and is treated with antibiotics. I have prescribed  Augmentin '875mg'$ /'125mg'$  one tablet twice daily with food, for 10 days.You may use an oral decongestant such as Mucinex D or if you have glaucoma or high blood pressure use plain Mucinex. Saline nasal spray help and can safely be used as often as needed for congestion.  If you develop worsening sinus pain, fever or notice severe headache and vision changes, or if symptoms are not better after completion of antibiotic, please schedule an appointment with a health care provider.   ? ?Sinus infections are not as easily transmitted as other respiratory infection, however we still recommend that you avoid close contact with loved ones, especially the very young and elderly.  Remember to wash your hands thoroughly throughout the day as this is the number one way to prevent the spread of infection! ? ?Home Care: ?Only take medications as instructed by your medical team. ?Complete the entire course of an antibiotic. ?Do not take these medications with alcohol. ?A steam or ultrasonic humidifier can help congestion.  You can place a towel over your head and breathe in the steam from hot water coming from a faucet. ?Avoid close contacts especially the very young and the elderly. ?Cover your mouth when you cough or sneeze. ?Always remember to wash your hands. ? ?Get Help Right Away If: ?You develop worsening fever or sinus pain. ?You develop a severe head ache or visual changes. ?Your symptoms persist after you have completed your treatment plan. ? ?Make sure you ?Understand these instructions. ?Will  watch your condition. ?Will get help right away if you are not doing well or get worse. ? ?Thank you for choosing an e-visit. ? ?Your e-visit answers were reviewed by a board certified advanced clinical practitioner to complete your personal care plan. Depending upon the condition, your plan could have included both over the counter or prescription medications. ? ?Please review your pharmacy choice. Make sure the pharmacy is open so you can pick up prescription now. If there is a problem, you may contact your provider through [employer] and have the prescription routed to another pharmacy.  Your safety is important to [name]. If you have drug allergies check your prescription carefully.  ? ?For the next 24 hours you can use MyChart to ask questions about today's visit, request a non-urgent call back, or ask for a work or school excuse. ?You will get an email in the next two days asking about your experience. I hope that your e-visit has been valuable and will speed your recovery. ? ? ?I provided 5 minutes of non face-to-face time during this encounter for chart review and documentation.  ? ?

## 2022-01-21 NOTE — Telephone Encounter (Signed)
Spoke with [facility] today advised to fax pt clearance form for Dr Sarajane Jews to complete ?

## 2022-01-25 ENCOUNTER — Encounter: Payer: Self-pay | Admitting: Internal Medicine

## 2022-01-25 ENCOUNTER — Ambulatory Visit: Payer: BC Managed Care – PPO | Admitting: Internal Medicine

## 2022-01-25 VITALS — BP 140/90 | HR 72 | Ht 59.0 in | Wt 207.2 lb

## 2022-01-25 DIAGNOSIS — E0590 Thyrotoxicosis, unspecified without thyrotoxic crisis or storm: Secondary | ICD-10-CM | POA: Diagnosis not present

## 2022-01-25 DIAGNOSIS — R7303 Prediabetes: Secondary | ICD-10-CM | POA: Diagnosis not present

## 2022-01-25 DIAGNOSIS — Z8639 Personal history of other endocrine, nutritional and metabolic disease: Secondary | ICD-10-CM | POA: Diagnosis not present

## 2022-01-25 DIAGNOSIS — E042 Nontoxic multinodular goiter: Secondary | ICD-10-CM | POA: Diagnosis not present

## 2022-01-25 MED ORDER — METFORMIN HCL 500 MG PO TABS: 500.0000 mg | ORAL_TABLET | Freq: Two times a day (BID) | ORAL | 3 refills | Status: DC

## 2022-01-25 MED ORDER — ATORVASTATIN CALCIUM 20 MG PO TABS: ORAL_TABLET | ORAL | 3 refills | Status: DC

## 2022-01-25 NOTE — Progress Notes (Signed)
Patient ID: Alison Best, female   DOB: 07/04/1956, 66 y.o.   MRN: [id number] ? ?This visit occurred during the SARS-CoV-2 public health emergency.  Safety protocols were in place, including screening questions prior to the visit, additional usage of staff PPE, and extensive cleaning of exam room while observing appropriate contact time as indicated for disinfecting solutions.  ? 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 Last visit 1 year ago.  She was previously not compliant with the recommended follow-up visits. ? ?Interim history: ?She retired before last visit.  Prior to our last visit she joined the gym  - pool, yoga.  However, she could not exercise recently 2/2 L hip pain. ?She continues to have some abdominal pain, chronic after her abdominal surgery and diverticulitis.  The pain felt better after she started to go to the gym, but unfortunately she cannot exercise for now.  She will need to have ventral hernia surgery.  She sees a Psychologist, sport and exercise at [employer]. ?She still has some palpitations - rarely: once a month. She is on Diltiazem. ?She needs to have a L total hip replacement on 02/23/2022. ? ?Subclinical thyrotoxicosis: ?-Patient with mild Graves' disease. ? ?We started methimazole in 05/2015.   ?In 01/2021, we stopped methimazole. ? ?Reviewed patient's TFTs: ?Lab Results  ?Component Value Date  ? TSH 2.12 05/06/2021  ? TSH 1.71 04/02/2021  ? TSH 2.08 01/27/2021  ? TSH 3.770 06/26/2020  ? TSH 2.88 10/05/2019  ? TSH 2.26 03/23/2019  ? TSH 2.44 09/20/2018  ? TSH 4.06 11/09/2017  ? TSH 10.28 (H) 09/30/2017  ? TSH 1.74 04/01/2017  ? FREET4 0.88 05/06/2021  ? FREET4 1.17 04/02/2021  ? FREET4 0.72 01/27/2021  ? FREET4 1.07 06/26/2020  ? FREET4 0.82 10/05/2019  ? FREET4 0.77 03/23/2019  ? FREET4 0.82 09/20/2018  ? FREET4 0.81 11/09/2017  ? FREET4 0.61 09/30/2017  ? FREET4 0.70 04/01/2017  ? ?Lab Results  ?Component  Value Date  ? T3FREE 4.6 (H) 05/06/2021  ? T3FREE 5.6 (H) 04/02/2021  ? T3FREE 2.8 01/27/2021  ? T3FREE 3.1 06/26/2020  ? T3FREE 3.0 10/05/2019  ? T3FREE 2.9 03/23/2019  ? T3FREE 2.9 09/20/2018  ? T3FREE 3.1 11/09/2017  ? T3FREE 3.1 09/30/2017  ? T3FREE 3.1 04/01/2017  ? ? ?Lab Results  ?Component Value Date  ? TSI 111 06/18/2014  ? ?07/10/2014 Uptake and scan: The 24 hr uptake by the thyroid gland is 28.5%. Normal 24 hr uptakeis 10-30 %. ?On thyroid imaging, the thyroid activity appears mildly heterogeneous in the right lobe with a possible cold nodule inferiorly. The left lobe activity appears homogeneous. ? ?Thyroid nodules: ? ?-Diagnosed in 06/2014 ? ?Reviewedprevious ultrasound reports that showed several bilateral thyroid nodules, with a right dominant 1.5 x 1.1 x 1.3 cm nodule.  We performed another thyroid ultrasound on 10/24/2018, which showed dissolution of the previous thyroid nodules.  The thyroid gland appears heterogeneous (please see assessment section). ? ?Pt denies: ?- feeling nodules in neck ?- hoarseness ?- dysphagia ?- choking ?- SOB with lying down ? ?Prediabetes: ? ?Reviewed HbA1c levels: ?Lab Results  ?Component Value Date  ? HGBA1C 6.1 01/27/2021  ? HGBA1C 6.1 (H) 06/26/2020  ? HGBA1C 6.0 10/05/2019  ? ?She continues: ?- Metformin 500 mg 2x a day with meals.  ? ?Kidney function was normal at home ?Lab Results  ?Component Value Date  ? BUN 20 04/03/2021  ? BUN 11 01/27/2021  ?  CREATININE 1.06 (H) 04/03/2021  ? CREATININE 0.71 01/27/2021  ? ?She has a history of hypertriglyceridemia: ?Lab Results  ?Component Value Date  ? CHOL 302 (H) 01/27/2021  ? HDL 24.00 (L) 01/27/2021  ? [place] 75 06/26/2020  ? LDLDIRECT 48.0 01/27/2021  ? TRIG (H) 01/27/2021  ?  666.0 Triglyceride is over 400; calculations on Lipids are invalid.  ? CHOLHDL 13 01/27/2021  ?She is off Vascepa (had GERD from this).  Also, at this visit, she is off Lipitor 20 (she stopped it when she started fenofibrate as she thought it was  causing constipation). She continues fenofibrate 145. ?A recent CT scan from 07/2021 showed hepatic steatosis. ? ?She was previously seen in the weight management clinic and she started to lose weight but did not continue the program. She was on Topiramate >> less carbohydrate cravings. ?She had colon resection in summer 2016, after an episode of diverticulitis >> she is s/p colonostomy closure. ? ?She also has a h/o vitamin D deficiency: ?Lab Results  ?Component Value Date  ? VD25OH 62.6 06/26/2020  ? VD25OH 59.61 10/05/2019  ? VD25OH 27.3 (L) 01/11/2019  ? ?On MVI - ~2000 units vit D. ? ?ROS: ?+ see HPI ?+ joint aches ? ?I reviewed pt's medications, allergies, PMH, social hx, family hx, and changes were documented in the history of present illness. Otherwise, unchanged from my initial visit note. ? ?Past Medical History:  ?Diagnosis Date  ? Anal fissure   ? Arthritis   ? Bursitis   ? Constipation   ? COPD (chronic obstructive pulmonary disease) ([place])   ? Diabetes mellitus without complication ([place])   ? sees Dr. Cruzita Lederer   ? Diverticulitis of colon 02/2015  ? Ectopic pregnancy   ? Emphysema of lung ([place])   ? Frozen shoulder   ? Heart murmur   ? Hernia, abdominal   ? Hyperlipidemia   ? Hyperthyroidism   ? Infertility, female   ? Irregular heartbeat   ? Joint pain   ? Obesity   ? Osteoarthritis   ? Sleep apnea   ? Swallowing difficulty   ? Thyroid disease   ? hyperthyroid  ? ?Past Surgical History:  ?Procedure Laterality Date  ? APPENDECTOMY    ? CESAREAN SECTION    ? COLONOSCOPY  09-10-10  ? per Dr. Acquanetta Sit, diverticulosis of descending colon and sigmoid, internal hemorrhoids, repeat in 10 yrs  ? COLOSTOMY N/A 02/20/2015  ? Procedure: COLOSTOMY;  Surgeon: Jackolyn Confer, MD;  Location: [facility];  Service: General;  Laterality: N/A;  ? COLOSTOMY REVISION  02/20/2015  ? Procedure: Sigmoid Colectomy;  Surgeon: Jackolyn Confer, MD;  Location: [facility];  Service: General;;  ? CYSTOSCOPY N/A 02/20/2015  ? Procedure:  CYSTOSCOPY;  Surgeon: Kathie Rhodes, MD;  Location: [facility];  Service: Urology;  Laterality: N/A;  ? DILATION AND CURETTAGE OF UTERUS    ? x3  ? ILEO LOOP COLOSTOMY CLOSURE N/A 09/26/2015  ? Procedure: LAPAROSCOPIC LYSIS OF ADHESIONS, COLOSTOMY CLOSURE;  Surgeon: Jackolyn Confer, MD;  Location: [facility];  Service: General;  Laterality: N/A;  ? LAPAROTOMY N/A 02/20/2015  ? Procedure: Emergency EXPLORATORY and drainiage of interabdominal abcesses;  Surgeon: Jackolyn Confer, MD;  Location: [facility];  Service: General;  Laterality: N/A;  ? SALPINGOOPHORECTOMY Left 02/20/2015  ? Procedure: SALPINGO OOPHORECTOMY;  Surgeon: Jackolyn Confer, MD;  Location: [facility];  Service: General;  Laterality: Left;  ? TONSILLECTOMY  1980  ? ?Social History  ? ?Socioeconomic History  ? Marital status: Married  ?  Spouse name: Not on file  ? Number of children: 1  ? Years of education: Not on file  ? Highest education level: Not on file  ?Occupational History  ? Occupation: Equities trader at [employer]  ?  Employer: [employer]  ?Tobacco Use  ? Smoking status: Former  ?  Packs/day: 0.25  ?  Years: 40.00  ?  Pack years: 10.00  ?  Types: Cigarettes  ? Smokeless tobacco: Never  ? Tobacco comments:  ?  recently quit smoking, 2 mos ago  ?Vaping Use  ? Vaping Use: Never used  ?Substance and Sexual Activity  ? Alcohol use: Yes  ?  Alcohol/week: 0.0 standard drinks  ?  Comment: rare  ? Drug use: No  ? Sexual activity: Not Currently  ?  Partners: Male  ?  Comment: 1ST intercourse- 46, partners- 34,  married x 32 yrs   ?Other Topics Concern  ? Not on file  ?Social History Narrative  ? Not on file  ? ?Social Determinants of Health  ? ?Financial Resource Strain: Not on file  ?Food Insecurity: Not on file  ?Transportation Needs: Not on file  ?Physical Activity: Not on file  ?Stress: Not on file  ?Social Connections: Not on file  ?Intimate Partner Violence: Not on file  ? ?Current Outpatient Medications on File Prior to Visit   ?Medication Sig Dispense Refill  ? acetaminophen (TYLENOL) 325 MG tablet Take 2 tablets (650 mg total) by mouth every 6 (six) hours as needed for moderate pain or headache.    ? acetaZOLAMIDE ER (DIAMOX) 500 MG capsul

## 2022-01-25 NOTE — Patient Instructions (Signed)
Please continue: ?- Metformin 500 mg 2x a day ? ?Targets: ?- before meals: 80-130 (for you: 80-120) ?- after meals: <180 (for you: <150) ? ?Please stop at the lab. ? ?Please come back for a follow-up appointment in 1 year. ?

## 2022-01-26 LAB — TSH: TSH: 0.84 u[IU]/mL (ref 0.35–5.50)

## 2022-01-26 LAB — HEMOGLOBIN A1C: Hgb A1c MFr Bld: 6 % (ref 4.6–6.5)

## 2022-01-26 LAB — VITAMIN D 25 HYDROXY (VIT D DEFICIENCY, FRACTURES): VITD: 29.99 ng/mL — ABNORMAL LOW (ref 30.00–100.00)

## 2022-01-26 LAB — T3, FREE: T3, Free: 3.4 pg/mL (ref 2.3–4.2)

## 2022-01-26 LAB — T4, FREE: Free T4: 1.07 ng/dL (ref 0.60–1.60)

## 2022-01-27 ENCOUNTER — Encounter: Payer: Self-pay | Admitting: Internal Medicine

## 2022-02-02 ENCOUNTER — Other Ambulatory Visit: Payer: Self-pay | Admitting: Internal Medicine

## 2022-02-02 ENCOUNTER — Other Ambulatory Visit: Payer: Self-pay | Admitting: Family Medicine

## 2022-02-09 NOTE — Progress Notes (Addendum)
Anesthesia Review: ? ?WUX:[id number] Sharlene Motts- Clearance on chart dated 01/28/2022.  ?Cardiologist : DR Darlina Guys clearance- 01/18/22 - Dayna Dunn,PAC  on chart  LOV 11/23/21  ?Chest x-ray : ?EKG : 11/23/21  ?Sleep test- 2021  ?Echo : 02/06/2020  ?Stress test: ?Cardiac Cath :  ?Activity level: can do a flgiht of stairs without difficulty  ?Sleep Study/ CPAP : mild sleep apnea no cpap  ?Fasting Blood Sugar :      / Checks Blood Sugar -- times a day:   ?Blood Thinner/ Instructions /Last Dose: ?ASA / Instructions/ Last Dose :   ?Hgba1c-01/25/22-6.0  ?Prediabetes - does not  check glucose at home  ?PT reports recent sinus infection treated with antibiotics on 01/23/2022.  Evaluated on Evisit  ( 01/20/22 in epic ) per pt . PT reports at preop that she thinks sinus infection has returned.  Informed PA at office of Dr Alvan Dame.  Instructed pt at preop appt to be evaluated again for sinus inrection prior to surgery.  PT voiced understanding.  PT reports have several sinus infections over the last 2-3 years.  PT states " I am going to have to go to specialist.  "   ?Blood bank called pt has positive antibody .  Will need type and scrren redrawn day of surgery. Placed on front of chart.  ?

## 2022-02-09 NOTE — Progress Notes (Signed)
DUE TO COVID-19 ONLY 2  VISITOR IS ALLOWED TO COME WITH YOU AND STAY IN THE WAITING ROOM ONLY DURING PRE OP AND PROCEDURE DAY OF SURGERY.   4  VISITOR  MAY VISIT WITH YOU AFTER SURGERY IN YOUR PRIVATE ROOM DURING VISITING HOURS ONLY! ?YOU MAY HAVE ONE PERSON SPEND THE NITE WITH YOU IN YOUR ROOM AFTER SURGERY.   ? ? Your procedure is scheduled on:  ?     02/23/2022  ? Report to [facility] Main  Entrance ? ? Report to admitting at       1015 am  ?DO NOT BRING INSURANCE CARD, PICTURE ID OR WALLET DAY OF SURGERY.  ?  ? ? Call this number if you have problems the morning of surgery [phone]  ? ? REMEMBER: NO  SOLID FOODS , CANDY, GUM OR MINTS AFTER MIDNITE THE NITE BEFORE SURGERY .       Marland Kitchen CLEAR LIQUIDS UNTIL     0945am            DAY OF SURGERY.      PLEASE FINISH  G2 Lower sugar  DRINK PER SURGEON ORDER  WHICH NEEDS TO BE COMPLETED AT     0945am       MORNING OF SURGERY.   ? ? ? ? ?CLEAR LIQUID DIET ? ? ?Foods Allowed      ?WATER ?BLACK COFFEE ( SUGAR OK, NO MILK, CREAM OR CREAMER) REGULAR AND DECAF  ?TEA ( SUGAR OK NO MILK, CREAM, OR CREAMER) REGULAR AND DECAF  ?PLAIN JELLO ( NO RED)  ?FRUIT ICES ( NO RED, NO FRUIT PULP)  ?POPSICLES ( NO RED)  ?JUICE- APPLE, WHITE GRAPE AND WHITE CRANBERRY  ?SPORT DRINK LIKE GATORADE ( NO RED)  ?CLEAR BROTH ( VEGETABLE , CHICKEN OR BEEF)                                                               ? ?    ? ?BRUSH YOUR TEETH MORNING OF SURGERY AND RINSE YOUR MOUTH OUT, NO CHEWING GUM CANDY OR MINTS. ?  ? ? Take these medicines the morning of surgery with A SIP OF WATER:  diltiazem  ? ? ?DO NOT TAKE ANY DIABETIC MEDICATIONS DAY OF YOUR SURGERY ?                  ?            You may not have any metal on your body including hair pins and  ?            piercings  Do not wear jewelry, make-up, lotions, powders or perfumes, deodorant ?            Do not wear nail polish on your fingernails.   ?           IF YOU ARE A FEMALE AND WANT TO SHAVE UNDER ARMS OR LEGS PRIOR TO SURGERY  YOU MUST DO SO AT LEAST 48 HOURS PRIOR TO SURGERY.  ?            Men may shave face and neck. ? ? Do not bring valuables to the hospital. [facility] NOT ?            RESPONSIBLE  FOR VALUABLES. ? Contacts, dentures or bridgework may not be worn into surgery. ? Leave suitcase in the car. After surgery it may be brought to your room. ? ?  ? Patients discharged the day of surgery will not be allowed to drive home. IF YOU ARE HAVING SURGERY AND GOING HOME THE SAME DAY, YOU MUST HAVE AN ADULT TO DRIVE YOU HOME AND BE WITH YOU FOR 24 HOURS. YOU MAY GO HOME BY TAXI OR UBER OR ORTHERWISE, BUT AN ADULT MUST ACCOMPANY YOU HOME AND STAY WITH YOU FOR 24 HOURS. ?  ? ?            Please read over the following fact sheets you were given: ?_____________________________________________________________________ ? ?[facility] - Preparing for Surgery ?Before surgery, you can play an important role.  Because skin is not sterile, your skin needs to be as free of germs as possible.  You can reduce the number of germs on your skin by washing with CHG (chlorahexidine gluconate) soap before surgery.  CHG is an antiseptic cleaner which kills germs and bonds with the skin to continue killing germs even after washing. ?Please DO NOT use if you have an allergy to CHG or antibacterial soaps.  If your skin becomes reddened/irritated stop using the CHG and inform your nurse when you arrive at Short Stay. ?Do not shave (including legs and underarms) for at least 48 hours prior to the first CHG shower.  You may shave your face/neck. ?Please follow these instructions carefully: ? 1.  Shower with CHG Soap the night before surgery and the  morning of Surgery. ? 2.  If you choose to wash your hair, wash your hair first as usual with your  normal  shampoo. ? 3.  After you shampoo, rinse your hair and body thoroughly to remove the  shampoo.                           4.  Use CHG as you would any other liquid soap.  You can apply chg directly  to the  skin and wash  ?                     Gently with a scrungie or clean washcloth. ? 5.  Apply the CHG Soap to your body ONLY FROM THE NECK DOWN.   Do not use on face/ open      ?                     Wound or open sores. Avoid contact with eyes, ears mouth and genitals (private parts).  ?                     Production manager,  Genitals (private parts) with your normal soap. ?            6.  Wash thoroughly, paying special attention to the area where your surgery  will be performed. ? 7.  Thoroughly rinse your body with warm water from the neck down. ? 8.  DO NOT shower/wash with your normal soap after using and rinsing off  the CHG Soap. ?               9.  Pat yourself dry with a clean towel. ?           10.  Wear clean pajamas. ?  11.  Place clean sheets on your bed the night of your first shower and do not  sleep with pets. ?Day of Surgery : ?Do not apply any lotions/deodorants the morning of surgery.  Please wear clean clothes to the hospital/surgery center. ? ?FAILURE TO FOLLOW THESE INSTRUCTIONS MAY RESULT IN THE CANCELLATION OF YOUR SURGERY ?PATIENT SIGNATURE_________________________________ ? ?NURSE SIGNATURE__________________________________ ? ?________________________________________________________________________  ? ? ?           ?

## 2022-02-11 ENCOUNTER — Other Ambulatory Visit: Payer: Self-pay

## 2022-02-11 ENCOUNTER — Encounter (HOSPITAL_COMMUNITY): Payer: Self-pay

## 2022-02-11 ENCOUNTER — Encounter (HOSPITAL_COMMUNITY)
Admission: RE | Admit: 2022-02-11 | Discharge: 2022-02-11 | Disposition: A | Discharge status: Home / Self Care | Payer: BC Managed Care – PPO | Source: Ambulatory Visit | Attending: Orthopedic Surgery | Admitting: Orthopedic Surgery

## 2022-02-11 VITALS — BP 112/58 | HR 63 | Temp 98.0°F | Resp 16 | Ht 60.5 in | Wt 199.0 lb

## 2022-02-11 DIAGNOSIS — F17210 Nicotine dependence, cigarettes, uncomplicated: Secondary | ICD-10-CM | POA: Insufficient documentation

## 2022-02-11 DIAGNOSIS — M1612 Unilateral primary osteoarthritis, left hip: Secondary | ICD-10-CM | POA: Insufficient documentation

## 2022-02-11 DIAGNOSIS — Z01812 Encounter for preprocedural laboratory examination: Secondary | ICD-10-CM | POA: Diagnosis not present

## 2022-02-11 DIAGNOSIS — J449 Chronic obstructive pulmonary disease, unspecified: Secondary | ICD-10-CM | POA: Diagnosis not present

## 2022-02-11 DIAGNOSIS — Z01818 Encounter for other preprocedural examination: Secondary | ICD-10-CM

## 2022-02-11 HISTORY — DX: Prediabetes: R73.03

## 2022-02-11 HISTORY — DX: Pneumonia, unspecified organism: J18.9

## 2022-02-11 LAB — COMPREHENSIVE METABOLIC PANEL
ALT: 45 U/L — ABNORMAL HIGH (ref 0–44)
AST: 28 U/L (ref 15–41)
Albumin: 4.3 g/dL (ref 3.5–5.0)
Alkaline Phosphatase: 57 U/L (ref 38–126)
Anion gap: 8 (ref 5–15)
BUN: 18 mg/dL (ref 8–23)
CO2: 25 mmol/L (ref 22–32)
Calcium: 9.9 mg/dL (ref 8.9–10.3)
Chloride: 108 mmol/L (ref 98–111)
Creatinine, Ser: 0.89 mg/dL (ref 0.44–1.00)
GFR, Estimated: >60 mL/min (ref >60)
Glucose, Bld: 117 mg/dL — ABNORMAL HIGH (ref 70–99)
Potassium: 4.4 mmol/L (ref 3.5–5.1)
Sodium: 141 mmol/L (ref 135–145)
Total Bilirubin: 0.5 mg/dL (ref 0.3–1.2)
Total Protein: 7.4 g/dL (ref 6.5–8.1)

## 2022-02-11 LAB — CBC
HCT: 47.1 % — ABNORMAL HIGH (ref 36.0–46.0)
Hemoglobin: 15.5 g/dL — ABNORMAL HIGH (ref 12.0–15.0)
MCH: 30.5 pg (ref 26.0–34.0)
MCHC: 32.9 g/dL (ref 30.0–36.0)
MCV: 92.7 fL (ref 80.0–100.0)
Platelets: 324 10*3/uL (ref 150–400)
RBC: 5.08 MIL/uL (ref 3.87–5.11)
RDW: 14.2 % (ref 11.5–15.5)
WBC: 10.6 10*3/uL — ABNORMAL HIGH (ref 4.0–10.5)
nRBC: 0 % (ref 0.0–0.2)

## 2022-02-11 LAB — SURGICAL PCR SCREEN
MRSA, PCR: POSITIVE — AB
Staphylococcus aureus: POSITIVE — AB

## 2022-02-11 LAB — GLUCOSE, CAPILLARY: Glucose-Capillary: 125 mg/dL — ABNORMAL HIGH (ref 70–99)

## 2022-02-12 NOTE — Progress Notes (Signed)
Anesthesia Chart Review ? ? Case: [id number] Date/Time: 02/23/22 1240  ? Procedure: TOTAL HIP ARTHROPLASTY ANTERIOR APPROACH (Left: Hip)  ? Anesthesia type: Spinal  ? Pre-op diagnosis: Left hip osteoarthritis  ? Location: [facility]OM 09 / [facility]  ? Surgeons: Paralee Cancel, MD  ? ?  ? ? ?DISCUSSION:65 y.o. smoker with h/o sleep apnea, COPD, left hip OA scheduled for above procedure 02/23/2022 with Dr. Paralee Cancel.  ? ?Per cardiology preoperative evaluation 01/19/2022, "Chart reviewed as part of pre-operative protocol coverage. She was last seen 11/23/21 by Dr. Angelena Form. He evaluated her for preoperative risk assessment and his note states, "She has no concerning symptoms. No CHF or angina. She can proceed with her planned surgical procedure." I reached out to patient for update to ensure no new issues. The patient affirms she has been doing well without any new cardiac symptoms. Therefore the clearance above still holds. The patient was advised that if she develops new symptoms prior to surgery to contact our office to arrange for a follow-up visit, and she verbalized understanding." ? ?Anticipate pt can proceed with planned procedure barring acute status change.   ?VS: BP (!) 112/58   Pulse 63   Temp 36.7 ?C (Oral)   Resp 16   Ht 5' 0.5" (1.537 m)   Wt 90.3 kg   LMP 03/22/2012   SpO2 98%   BMI 38.22 kg/m?  ? ?PROVIDERS: ?Laurey Morale, MD is PCP  ? ?Primary Cardiologist: Lauree Chandler, MD ?LABS: Labs reviewed: Acceptable for surgery. ?(all labs ordered are listed, but only abnormal results are displayed) ? ?Labs Reviewed  ?SURGICAL PCR SCREEN - Abnormal; Notable for the following components:  ?    Result Value  ? MRSA, PCR POSITIVE (*)   ? Staphylococcus aureus POSITIVE (*)   ? All other components within normal limits  ?CBC - Abnormal; Notable for the following components:  ? WBC 10.6 (*)   ? Hemoglobin 15.5 (*)   ? HCT 47.1 (*)   ? All other components within normal limits  ?COMPREHENSIVE METABOLIC PANEL -  Abnormal; Notable for the following components:  ? Glucose, Bld 117 (*)   ? ALT 45 (*)   ? All other components within normal limits  ?GLUCOSE, CAPILLARY - Abnormal; Notable for the following components:  ? Glucose-Capillary 125 (*)   ? All other components within normal limits  ?TYPE AND SCREEN  ? ? ? ?IMAGES: ? ? ?EKG: ?11/23/2021 ?Rate 74 bpm  ?Sinus  ? ?CV: ?Echo 02/06/2020 ?1. Left ventricular ejection fraction, by estimation, is 60 to 65%. The  ?left ventricle has normal function. The left ventricle has no regional  ?wall motion abnormalities. Left ventricular diastolic parameters were  ?normal.  ? 2. Right ventricular systolic function is normal. The right ventricular  ?size is normal. There is mildly elevated pulmonary artery systolic  ?pressure.  ? 3. Left atrial size was mildly dilated.  ? 4. The mitral valve is normal in structure. Trivial mitral valve  ?regurgitation. No evidence of mitral stenosis.  ? 5. The aortic valve is tricuspid. Aortic valve regurgitation is not  ?visualized. Mild aortic valve sclerosis is present, with no evidence of  ?aortic valve stenosis.  ? 6. The inferior vena cava is normal in size with greater than 50%  ?respiratory variability, suggesting right atrial pressure of 3 mmHg.  ?Past Medical History:  ?Diagnosis Date  ? Anal fissure   ? Arthritis   ? Bursitis   ? Constipation   ? COPD (  chronic obstructive pulmonary disease) ([place])   ? Diverticulitis of colon 02/2015  ? Ectopic pregnancy   ? Emphysema of lung ([place])   ? Frozen shoulder   ? Heart murmur   ? Hernia, abdominal   ? Hyperlipidemia   ? Hyperthyroidism   ? Infertility, female   ? Irregular heartbeat   ? Joint pain   ? Obesity   ? Osteoarthritis   ? Pneumonia   ? Pre-diabetes   ? Sleep apnea   ? Swallowing difficulty   ? Thyroid disease   ? hyperthyroid  ? ? ?Past Surgical History:  ?Procedure Laterality Date  ? APPENDECTOMY    ? CESAREAN SECTION    ? COLONOSCOPY  09/10/2010  ? per Dr. Acquanetta Sit, diverticulosis of  descending colon and sigmoid, internal hemorrhoids, repeat in 10 yrs  ? COLOSTOMY N/A 02/20/2015  ? Procedure: COLOSTOMY;  Surgeon: Jackolyn Confer, MD;  [facility];  Service: General;  Laterality: N/A;  ? COLOSTOMY REVISION  02/20/2015  ? Procedure: Sigmoid Colectomy;  Surgeon: Jackolyn Confer, MD;  [facility];  Service: General;;  ? CYSTOSCOPY N/A 02/20/2015  ? Procedure: CYSTOSCOPY;  Surgeon: Kathie Rhodes, MD;  [facility];  Service: Urology;  Laterality: N/A;  ? DILATION AND CURETTAGE OF UTERUS    ? x3  ? ECTOPIC PREGNANCY SURGERY    ? ILEO LOOP COLOSTOMY CLOSURE N/A 09/26/2015  ? Procedure: LAPAROSCOPIC LYSIS OF ADHESIONS, COLOSTOMY CLOSURE;  Surgeon: Jackolyn Confer, MD;  [facility];  Service: General;  Laterality: N/A;  ? LAPAROTOMY N/A 02/20/2015  ? Procedure: Emergency EXPLORATORY and drainiage of interabdominal abcesses;  Surgeon: Jackolyn Confer, MD;  [facility];  Service: General;  Laterality: N/A;  ? SALPINGOOPHORECTOMY Left 02/20/2015  ? Procedure: SALPINGO OOPHORECTOMY;  Surgeon: Jackolyn Confer, MD;  [facility];  Service: General;  Laterality: Left;  ? TONSILLECTOMY  1980  ? ? ?MEDICATIONS: ? acetaminophen (TYLENOL) 325 MG tablet  ? acetaZOLAMIDE ER (DIAMOX) 500 MG capsule  ? atorvastatin (LIPITOR) 20 MG tablet  ? betamethasone dipropionate 0.05 % cream  ? Coenzyme Q10 (CO Q 10 PO)  ? COLLAGEN PO  ? diclofenac (VOLTAREN) 75 MG EC tablet  ? diltiazem (CARDIZEM CD) 120 MG 24 hr capsule  ? fenofibrate (TRICOR) 145 MG tablet  ? fenofibrate micronized (LOFIBRA) 134 MG capsule  ? furosemide (LASIX) 20 MG tablet  ? glucose blood (CONTOUR NEXT TEST) test strip  ? metFORMIN (GLUCOPHAGE) 500 MG tablet  ? metroNIDAZOLE (METROGEL) 1 % gel  ? Microlet Lancets MISC  ? Multiple Vitamins-Minerals (MULTIVITAMIN/EXTRA VITAMIN D3 PO)  ? OVER THE COUNTER MEDICATION  ? OVER THE COUNTER MEDICATION  ? OVER THE COUNTER MEDICATION  ? OVER THE COUNTER MEDICATION  ? OVER THE COUNTER  MEDICATION  ? Turmeric 400 MG CAPS  ? ?No current facility-administered medications for this encounter.  ? ? ?Konrad Felix Ward, PA-C ?[facility] ?[phone] ? ? ? ? ? ? ?

## 2022-02-17 ENCOUNTER — Encounter: Payer: Self-pay | Admitting: Obstetrics & Gynecology

## 2022-02-17 ENCOUNTER — Ambulatory Visit (INDEPENDENT_AMBULATORY_CARE_PROVIDER_SITE_OTHER): Payer: BC Managed Care – PPO | Admitting: Obstetrics & Gynecology

## 2022-02-17 VITALS — BP 116/74 | HR 68 | Resp 16 | Ht 59.75 in | Wt 203.0 lb

## 2022-02-17 DIAGNOSIS — Z6839 Body mass index (BMI) 39.0-39.9, adult: Secondary | ICD-10-CM

## 2022-02-17 DIAGNOSIS — F17290 Nicotine dependence, other tobacco product, uncomplicated: Secondary | ICD-10-CM

## 2022-02-17 DIAGNOSIS — Z780 Asymptomatic menopausal state: Secondary | ICD-10-CM | POA: Diagnosis not present

## 2022-02-17 DIAGNOSIS — Z01419 Encounter for gynecological examination (general) (routine) without abnormal findings: Secondary | ICD-10-CM | POA: Diagnosis not present

## 2022-02-17 NOTE — Progress Notes (Signed)
? ? ?[facility] 27-Aug-1956 [id number] ? ? ?History:    66 y.o.  [id number] Married.  Son 78 yo.  S/P Lt SO.  Retired. ?  ?RP:  Established patient presenting for annual gyn exam  ?  ?HPI:  Postmenopause.  No HRT.  No PMB.  No pelvic pain.  Currently abstinent.  Pap Neg 11/2020.  No h/o abnormal Pap.  Will repeat at 3 yrs. Breasts normal.  Mammo Neg 01/2021, will schedule now. Obesity with BMI much improved at 39.98. Osteoarthritis.  Knee replacement scheduled next week. Had a bowel perforation secondary to Diverticulitis with Bowel resection, had colostomy. Following many surgeries, developed a large Left Abdominal Hernia.  Lost weight so repair will be scheduled about 3 months after her knee replacement. Cigarette smoker <1/2 pack per day.  Health labs with Dr Sarajane Jews.  Colono in 2016, will check with Gastro for next Colono.  BD Normal in 09/2020. ? ? ?Past medical history,surgical history, family history and social history were all reviewed and documented in the EPIC chart. ? ?Gynecologic History ?Patient's last menstrual period was 03/22/2012. ? ?Obstetric History ?OB History  ?Gravida Para Term Preterm AB Living  ?'2 1 1   1 1  '$ ?SAB IAB Ectopic Multiple Live Births  ?    1   1  ?  ?# Outcome Date GA Lbr Len/2nd Weight Sex Delivery Anes PTL Lv  ?2 Ectopic           ?1 Term           ? ? ? ?ROS: A ROS was performed and pertinent positives and negatives are included in the history. ? GENERAL: No fevers or chills. HEENT: No change in vision, no earache, sore throat or sinus congestion. NECK: No pain or stiffness. CARDIOVASCULAR: No chest pain or pressure. No palpitations. PULMONARY: No shortness of breath, cough or wheeze. GASTROINTESTINAL: No abdominal pain, nausea, vomiting or diarrhea, melena or bright red blood per rectum. GENITOURINARY: No urinary frequency, urgency, hesitancy or dysuria. MUSCULOSKELETAL: No joint or muscle pain, no back pain, no recent trauma. DERMATOLOGIC: No rash, no itching, no  lesions. ENDOCRINE: No polyuria, polydipsia, no heat or cold intolerance. No recent change in weight. HEMATOLOGICAL: No anemia or easy bruising or bleeding. NEUROLOGIC: No headache, seizures, numbness, tingling or weakness. PSYCHIATRIC: No depression, no loss of interest in normal activity or change in sleep pattern.  ?  ? ?Exam: ? ? ?BP 116/74   Pulse 68   Resp 16   Ht 4' 11.75" (1.518 m)   Wt 203 lb (92.1 kg)   LMP 03/22/2012   BMI 39.98 kg/m?  ? ?Body mass index is 39.98 kg/m?. ? ?General appearance : Well developed well nourished female. No acute distress ?HEENT: Eyes: no retinal hemorrhage or exudates,  Neck supple, trachea midline, no carotid bruits, no thyroidmegaly ?Lungs: Clear to auscultation, no rhonchi or wheezes, or rib retractions  ?Heart: Regular rate and rhythm, no murmurs or gallops ?Breast:Examined in sitting and supine position were symmetrical in appearance, no palpable masses or tenderness,  no skin retraction, no nipple inversion, no nipple discharge, no skin discoloration, no axillary or supraclavicular lymphadenopathy ?Abdomen: no palpable masses or tenderness, no rebound or guarding ?Extremities: no edema or skin discoloration or tenderness ? ?Pelvic: Vulva: Normal ?            Vagina: No gross lesions or discharge ? Cervix: No gross lesions or discharge ? Uterus  AV, normal size, shape and consistency, non-tender and mobile ?  Adnexa  Without masses or tenderness ? Anus: Normal ? ? ?Assessment/Plan:  66 y.o. female for annual exam  ? ?1. Well female exam with routine gynecological exam ?Postmenopause.  No HRT.  No PMB.  No pelvic pain.  Currently abstinent.  Pap Neg 11/2020.  No h/o abnormal Pap.  Will repeat at 3 yrs. Breasts normal.  Mammo Neg 01/2021, will schedule now. Obesity with BMI much improved at 39.98. Osteoarthritis.  Knee replacement scheduled next week. Had a bowel perforation secondary to Diverticulitis with Bowel resection, had colostomy. Following many surgeries,  developed a large Left Abdominal Hernia.  Lost weight so repair will be scheduled about 3 months after her knee replacement. Cigarette smoker <1/2 pack per day.  Health labs with Dr Sarajane Jews.  Colono in 2016, will check with Gastro for next Colono.  BD Normal in 09/2020. ? ?2. Postmenopausal ?Postmenopause.  No HRT.  No PMB.  No pelvic pain.  Currently abstinent.  ? ?3. Cigar smoker ?Recommend reducing progressively, to then quit. ? ?4. Class 2 severe obesity due to excess calories with serious comorbidity and body mass index (BMI) of 39.0 to 39.9 in adult [facility]) ?Successful weight loss.  BMI now at 39.98.  Knee replacement scheduled for next week.  Will then have the hernia repair about 3 months later. ? ?Other orders ?- UNABLE TO FIND; Med Name: vegetable blend ?- Omega-3 Fatty Acids (FISH OIL PO); Take by mouth.  ? ?Princess Bruins MD, 1:45 PM 02/17/2022 ? ?  ?

## 2022-02-18 NOTE — H&P (Signed)
TOTAL HIP ADMISSION H&P ? ?Patient is admitted for left total hip arthroplasty. ? ?Subjective: ? ?Chief Complaint: left hip pain ? ?HPI: Alison Best, 66 y.o. female, has a history of pain and functional disability in the left hip(s) due to arthritis and patient has failed non-surgical conservative treatments for greater than 12 weeks to include NSAID's and/or analgesics and activity modification.  Onset of symptoms was gradual starting 2 years ago with gradually worsening course since that time.The patient noted no past surgery on the left hip(s).  Patient currently rates pain in the left hip at 8 out of 10 with activity. Patient has worsening of pain with activity and weight bearing, pain that interfers with activities of daily living, and pain with passive range of motion. Patient has evidence of joint space narrowing by imaging studies. This condition presents safety issues increasing the risk of falls.  There is no current active infection. ? ?Patient Active Problem List  ? Diagnosis Date Noted  ? Adjustment disorder with depressed mood 11/18/2021  ? COVID-19 virus infection 07/07/2021  ? Psoriasis 01/15/2021  ? Chronic pain of left knee 01/15/2021  ? Ventral hernia without obstruction or gangrene 01/15/2021  ? Class 3 severe obesity with serious comorbidity and body mass index (BMI) of 40.0 to 44.9 in adult [facility]) 01/15/2019  ? Acne rosacea 05/10/2016  ? Multinodular goiter 02/27/2016  ? Subclinical thyrotoxicosis 02/26/2016  ? 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 Prediabetes 07/30/2015  ? Perforation of sigmoid colon ([place]) 02/20/2015  ? Diverticulitis of large intestine with abscess without bleeding 02/18/2015  ? OSA (obstructive sleep apnea) 05/09/2013  ? GASTROENTERITIS 08/18/2010  ? RAYNAUD'S SYNDROME 02/17/2009  ? PLANTAR FASCIITIS 02/17/2009  ? Asthma 06/12/2008  ? Hyperlipidemia 09/04/2007  ? DEVIATED SEPTUM 09/04/2007  ? ALLERGIC RHINITIS 09/04/2007  ? GERD  09/04/2007  ? OSTEOARTHRITIS 09/04/2007  ? HEADACHE 09/04/2007  ? ?Past Medical History:  ?Diagnosis Date  ? Anal fissure   ? Arthritis   ? Bursitis   ? Constipation   ? COPD (chronic obstructive pulmonary disease) ([place])   ? Diverticulitis of colon 02/2015  ? Ectopic pregnancy   ? Emphysema of lung ([place])   ? Frozen shoulder   ? Heart murmur   ? Hernia, abdominal   ? Hyperlipidemia   ? Hyperthyroidism   ? Infertility, female   ? Irregular heartbeat   ? Joint pain   ? Obesity   ? Osteoarthritis   ? Pneumonia   ? Pre-diabetes   ? Sleep apnea   ? Swallowing difficulty   ? Thyroid disease   ? hyperthyroid  ?  ?Past Surgical History:  ?Procedure Laterality Date  ? APPENDECTOMY    ? CESAREAN SECTION    ? COLONOSCOPY  09/10/2010  ? per Dr. Acquanetta Sit, diverticulosis of descending colon and sigmoid, internal hemorrhoids, repeat in 10 yrs  ? COLOSTOMY N/A 02/20/2015  ? Procedure: COLOSTOMY;  Surgeon: Jackolyn Confer, MD;  [facility];  Service: General;  Laterality: N/A;  ? COLOSTOMY REVISION  02/20/2015  ? Procedure: Sigmoid Colectomy;  Surgeon: Jackolyn Confer, MD;  [facility];  Service: General;;  ? CYSTOSCOPY N/A 02/20/2015  ? Procedure: CYSTOSCOPY;  Surgeon: Kathie Rhodes, MD;  [facility];  Service: Urology;  Laterality: N/A;  ? DILATION AND CURETTAGE OF UTERUS    ? x3  ? ECTOPIC PREGNANCY SURGERY    ? ILEO LOOP COLOSTOMY CLOSURE N/A 09/26/2015  ? Procedure: LAPAROSCOPIC LYSIS OF ADHESIONS, COLOSTOMY  CLOSURE;  Surgeon: Jackolyn Confer, MD;  [facility];  Service: General;  Laterality: N/A;  ? LAPAROTOMY N/A 02/20/2015  ? Procedure: Emergency EXPLORATORY and drainiage of interabdominal abcesses;  Surgeon: Jackolyn Confer, MD;  [facility];  Service: General;  Laterality: N/A;  ? SALPINGOOPHORECTOMY Left 02/20/2015  ? Procedure: SALPINGO OOPHORECTOMY;  Surgeon: Jackolyn Confer, MD;  [facility];  Service: General;  Laterality: Left;  ? TONSILLECTOMY  1980  ?  ?No current facility-administered  medications for this encounter.  ? ?Current Outpatient Medications  ?Medication Sig Dispense Refill Last Dose  ? acetaminophen (TYLENOL) 325 MG tablet Take 2 tablets (650 mg total) by mouth every 6 (six) hours as needed for moderate pain or headache.     ? acetaZOLAMIDE ER (DIAMOX) 500 MG capsule Take 1 capsule (500 mg total) by mouth 2 (two) times daily. When travelling at high altitudes 60 capsule 2   ? atorvastatin (LIPITOR) 20 MG tablet TAKE 1 TABLET(20 MG) BY MOUTH DAILY 90 tablet 3   ? betamethasone dipropionate 0.05 % cream Apply topically 2 (two) times daily. (Patient taking differently: Apply 1 application. topically daily as needed (Psoriasis).) 45 g 2   ? Coenzyme Q10 (CO Q 10 PO) Take 200 mg by mouth daily.     ? COLLAGEN PO Take 15 mLs by mouth daily. Liquid     ? diclofenac (VOLTAREN) 75 MG EC tablet TAKE 1 TABLET(75 MG) BY MOUTH TWICE DAILY 180 tablet 0   ? diltiazem (CARDIZEM CD) 120 MG 24 hr capsule Take 1 capsule (120 mg total) by mouth daily. 90 capsule 3   ? fenofibrate micronized (LOFIBRA) 134 MG capsule TAKE ONE CAPSULE BY MOUTH EVERY DAY 90 capsule 0   ? furosemide (LASIX) 20 MG tablet Take 20 mg by mouth daily as needed for fluid or edema.     ? metFORMIN (GLUCOPHAGE) 500 MG tablet TAKE 1 TABLET(500 MG) BY MOUTH TWICE DAILY 180 tablet 3   ? Multiple Vitamins-Minerals (MULTIVITAMIN/EXTRA VITAMIN D3 PO) Take 1 tablet by mouth daily.     ? OVER THE COUNTER MEDICATION Take 1 tablet by mouth daily. [place]affron     ? OVER THE COUNTER MEDICATION Take 320 mg by mouth daily. phytoceramides     ? OVER THE COUNTER MEDICATION Take 2 capsules by mouth daily. Memory and Brain     ? OVER THE COUNTER MEDICATION Take 1 capsule by mouth daily. Cruciferous extract     ? OVER THE COUNTER MEDICATION Take 1 capsule by mouth daily. fruit full antioxidant     ? Turmeric 400 MG CAPS Take 400 mg by mouth daily.     ? Omega-3 Fatty Acids (FISH OIL PO) Take by mouth.     ? UNABLE TO FIND Med Name: vegetable blend      ? ?Allergies  ?Allergen Reactions  ? Levofloxacin In D5w Other (See Comments)  ?  Achilles tendon weakness  ? Mango Flavor Swelling  ?  fruit  ? Bupropion Nausea And Vomiting  ?  ?Social History  ? ?Tobacco Use  ? Smoking status: Every Day  ?  Packs/day: 0.25  ?  Years: 40.00  ?  Pack years: 10.00  ?  Types: Cigarettes  ? Smokeless tobacco: Never  ?Substance Use Topics  ? Alcohol use: Not Currently  ?  ?Family History  ?Problem Relation Age of Onset  ? Breast cancer Mother   ? High blood pressure Mother   ? Kidney disease Mother   ? Sleep  apnea Mother   ? Obesity Mother   ? CAD Father   ?     CABG  ? Skin cancer Father   ? Lung cancer Father   ? Diverticulitis Father   ? Colon polyps Father   ? High blood pressure Father   ? Stroke Father   ? Heart disease Father   ? Sleep apnea Father   ? Allergies Brother   ? Diabetes Brother   ? Breast cancer Maternal Grandmother   ? Heart attack Maternal Grandfather   ? Heart attack Paternal Grandfather   ? COPD Other   ? Cancer Other   ? Heart disease Other   ? Stroke Other   ?  ? ?Review of Systems  ?Constitutional:  Negative for chills and fever.  ?Respiratory:  Negative for cough and shortness of breath.   ?Cardiovascular:  Negative for chest pain.  ?Gastrointestinal:  Negative for nausea and vomiting.  ?Musculoskeletal:  Positive for arthralgias.  ? ? ?Objective: ? ?Physical Exam ?Well nourished and well developed. ?General: Alert and oriented x3, cooperative and pleasant, no acute distress. ?Head: normocephalic, atraumatic, neck supple. ?Eyes: EOMI. ? ?Musculoskeletal: ?Left hip exam: ?Limited hip flexion internal rotation to 5 degrees with pain with external rotation to 20 degrees ?Limited active hip flexion of 110 degrees with 5 -/5 strength ?No significant lower extremity edema, erythema or calf tenderness ? ? ?Calves soft and nontender. Motor function intact in LE. Strength 5/5 LE bilaterally. ?Neuro: Distal pulses 2+. Sensation to light touch intact in LE. ? ?Vital  signs in last 24 hours: ?  ? ?Labs: ? ? ?Estimated body mass index is 39.98 kg/m? as calculated from the following: ?  Height as of 02/17/22: 4' 11.75" (1.518 m). ?  Weight as of 02/17/22: 92.1 kg. ? ? ?Imaging

## 2022-02-23 ENCOUNTER — Ambulatory Visit (HOSPITAL_COMMUNITY): Payer: BC Managed Care – PPO | Admitting: Physician Assistant

## 2022-02-23 ENCOUNTER — Other Ambulatory Visit: Payer: Self-pay

## 2022-02-23 ENCOUNTER — Encounter (HOSPITAL_COMMUNITY): Payer: Self-pay | Admitting: Orthopedic Surgery

## 2022-02-23 ENCOUNTER — Ambulatory Visit (HOSPITAL_COMMUNITY): Payer: BC Managed Care – PPO | Admitting: Certified Registered"

## 2022-02-23 ENCOUNTER — Observation Stay (HOSPITAL_COMMUNITY): Payer: BC Managed Care – PPO

## 2022-02-23 ENCOUNTER — Observation Stay (HOSPITAL_COMMUNITY)
Admission: RE | Admit: 2022-02-23 | Discharge: 2022-02-25 | Disposition: A | Discharge status: Home / Self Care | Payer: BC Managed Care – PPO | Attending: Orthopedic Surgery | Admitting: Orthopedic Surgery

## 2022-02-23 ENCOUNTER — Encounter (HOSPITAL_COMMUNITY)
Admission: RE | Disposition: A | Discharge status: Home / Self Care | Payer: Self-pay | Source: Home / Self Care | Attending: Orthopedic Surgery

## 2022-02-23 ENCOUNTER — Ambulatory Visit (HOSPITAL_COMMUNITY): Payer: BC Managed Care – PPO

## 2022-02-23 DIAGNOSIS — J45909 Unspecified asthma, uncomplicated: Secondary | ICD-10-CM | POA: Insufficient documentation

## 2022-02-23 DIAGNOSIS — F17210 Nicotine dependence, cigarettes, uncomplicated: Secondary | ICD-10-CM | POA: Diagnosis not present

## 2022-02-23 DIAGNOSIS — Z79899 Other long term (current) drug therapy: Secondary | ICD-10-CM | POA: Diagnosis not present

## 2022-02-23 DIAGNOSIS — Z7984 Long term (current) use of oral hypoglycemic drugs: Secondary | ICD-10-CM | POA: Insufficient documentation

## 2022-02-23 DIAGNOSIS — R7303 Prediabetes: Secondary | ICD-10-CM | POA: Diagnosis not present

## 2022-02-23 DIAGNOSIS — M1612 Unilateral primary osteoarthritis, left hip: Principal | ICD-10-CM | POA: Insufficient documentation

## 2022-02-23 DIAGNOSIS — Z471 Aftercare following joint replacement surgery: Secondary | ICD-10-CM | POA: Diagnosis not present

## 2022-02-23 DIAGNOSIS — I739 Peripheral vascular disease, unspecified: Secondary | ICD-10-CM | POA: Diagnosis not present

## 2022-02-23 DIAGNOSIS — J449 Chronic obstructive pulmonary disease, unspecified: Secondary | ICD-10-CM | POA: Diagnosis not present

## 2022-02-23 DIAGNOSIS — Z96642 Presence of left artificial hip joint: Secondary | ICD-10-CM | POA: Diagnosis not present

## 2022-02-23 DIAGNOSIS — Z8616 Personal history of COVID-19: Secondary | ICD-10-CM | POA: Insufficient documentation

## 2022-02-23 DIAGNOSIS — M1611 Unilateral primary osteoarthritis, right hip: Secondary | ICD-10-CM | POA: Diagnosis not present

## 2022-02-23 DIAGNOSIS — E0590 Thyrotoxicosis, unspecified without thyrotoxic crisis or storm: Secondary | ICD-10-CM | POA: Diagnosis not present

## 2022-02-23 HISTORY — PX: TOTAL HIP ARTHROPLASTY: SHX124

## 2022-02-23 LAB — BPAM RBC
Blood Product Expiration Date: 202305172359
Blood Product Expiration Date: 202305182359
Unit Type and Rh: 5100
Unit Type and Rh: 5100

## 2022-02-23 LAB — TYPE AND SCREEN
ABO/RH(D): O POS
Antibody Screen: POSITIVE
Unit division: 0
Unit division: 0

## 2022-02-23 LAB — GLUCOSE, CAPILLARY
Glucose-Capillary: 123 mg/dL — ABNORMAL HIGH (ref 70–99)
Glucose-Capillary: 100 mg/dL — ABNORMAL HIGH (ref 70–99)

## 2022-02-23 SURGERY — ARTHROPLASTY, HIP, TOTAL, ANTERIOR APPROACH
Anesthesia: Spinal | Site: Hip | Laterality: Left

## 2022-02-23 MED ORDER — CHLORHEXIDINE GLUCONATE 0.12 % MT SOLN
15.0000 mL | Freq: Once | OROMUCOSAL | Status: AC
  Administered 2022-02-23: 15 mL via OROMUCOSAL

## 2022-02-23 MED ORDER — DEXAMETHASONE SODIUM PHOSPHATE 10 MG/ML IJ SOLN
10.0000 mg | Freq: Once | INTRAMUSCULAR | Status: AC
  Administered 2022-02-24: 10 mg via INTRAVENOUS
  Filled 2022-02-23: qty 1

## 2022-02-23 MED ORDER — ONDANSETRON HCL 4 MG/2ML IJ SOLN
INTRAMUSCULAR | Status: AC
  Filled 2022-02-23: qty 2

## 2022-02-23 MED ORDER — METHOCARBAMOL 1000 MG/10ML IJ SOLN
500.0000 mg | Freq: Four times a day (QID) | INTRAVENOUS | Status: DC | PRN
  Filled 2022-02-23: qty 5

## 2022-02-23 MED ORDER — BISACODYL 10 MG RE SUPP: 10.0000 mg | Freq: Every day | RECTAL | Status: DC | PRN

## 2022-02-23 MED ORDER — MORPHINE SULFATE (PF) 2 MG/ML IV SOLN: 0.5000 mg | INTRAVENOUS | Status: DC | PRN

## 2022-02-23 MED ORDER — ASPIRIN 81 MG PO CHEW
81.0000 mg | CHEWABLE_TABLET | Freq: Two times a day (BID) | ORAL | Status: DC
  Administered 2022-02-23 – 2022-02-25 (×4): 81 mg via ORAL
  Filled 2022-02-23 (×4): qty 1

## 2022-02-23 MED ORDER — LACTATED RINGERS IV SOLN: INTRAVENOUS | Status: DC

## 2022-02-23 MED ORDER — HYDROMORPHONE HCL 1 MG/ML IJ SOLN
INTRAMUSCULAR | Status: AC
  Filled 2022-02-23: qty 1

## 2022-02-23 MED ORDER — HYDROCODONE-ACETAMINOPHEN 7.5-325 MG PO TABS
1.0000 | ORAL_TABLET | ORAL | Status: DC | PRN
  Administered 2022-02-23 – 2022-02-24 (×6): 2 via ORAL
  Administered 2022-02-25: 1 via ORAL
  Filled 2022-02-23 (×7): qty 2

## 2022-02-23 MED ORDER — TRANEXAMIC ACID-NACL 1000-0.7 MG/100ML-% IV SOLN
1000.0000 mg | Freq: Once | INTRAVENOUS | Status: AC
  Administered 2022-02-23: 1000 mg via INTRAVENOUS
  Filled 2022-02-23: qty 100

## 2022-02-23 MED ORDER — BETAMETHASONE DIPROPIONATE 0.05 % EX CREA: 1.0000 "application " | TOPICAL_CREAM | Freq: Every day | CUTANEOUS | Status: DC | PRN

## 2022-02-23 MED ORDER — PROPOFOL 500 MG/50ML IV EMUL
INTRAVENOUS | Status: DC | PRN
  Administered 2022-02-23: 100 ug/kg/min via INTRAVENOUS

## 2022-02-23 MED ORDER — FENOFIBRATE 160 MG PO TABS
160.0000 mg | ORAL_TABLET | Freq: Every day | ORAL | Status: DC
  Administered 2022-02-23: 160 mg via ORAL
  Filled 2022-02-23: qty 1

## 2022-02-23 MED ORDER — CEFAZOLIN SODIUM-DEXTROSE 2-4 GM/100ML-% IV SOLN
2.0000 g | INTRAVENOUS | Status: AC
  Administered 2022-02-23: 2 g via INTRAVENOUS
  Filled 2022-02-23: qty 100

## 2022-02-23 MED ORDER — METOCLOPRAMIDE HCL 5 MG PO TABS: 5.0000 mg | ORAL_TABLET | Freq: Three times a day (TID) | ORAL | Status: DC | PRN

## 2022-02-23 MED ORDER — VANCOMYCIN HCL IN DEXTROSE 1-5 GM/200ML-% IV SOLN
1000.0000 mg | Freq: Once | INTRAVENOUS | Status: AC
  Administered 2022-02-23: 1000 mg via INTRAVENOUS
  Filled 2022-02-23: qty 200

## 2022-02-23 MED ORDER — MIDAZOLAM HCL 5 MG/5ML IJ SOLN
INTRAMUSCULAR | Status: DC | PRN
  Administered 2022-02-23: 2 mg via INTRAVENOUS

## 2022-02-23 MED ORDER — SODIUM CHLORIDE 0.9 % IR SOLN
Status: DC | PRN
  Administered 2022-02-23: 1000 mL

## 2022-02-23 MED ORDER — PROMETHAZINE HCL 25 MG/ML IJ SOLN: 6.2500 mg | INTRAMUSCULAR | Status: DC | PRN

## 2022-02-23 MED ORDER — METFORMIN HCL 500 MG PO TABS
500.0000 mg | ORAL_TABLET | Freq: Two times a day (BID) | ORAL | Status: DC
  Administered 2022-02-24 – 2022-02-25 (×3): 500 mg via ORAL
  Filled 2022-02-23 (×3): qty 1

## 2022-02-23 MED ORDER — DEXAMETHASONE SODIUM PHOSPHATE 10 MG/ML IJ SOLN
8.0000 mg | Freq: Once | INTRAMUSCULAR | Status: AC
  Administered 2022-02-23: 8 mg via INTRAVENOUS

## 2022-02-23 MED ORDER — DILTIAZEM HCL ER COATED BEADS 120 MG PO CP24
120.0000 mg | ORAL_CAPSULE | Freq: Every day | ORAL | Status: DC
Start: 2022-02-24 — End: 2022-02-25
  Administered 2022-02-24: 120 mg via ORAL
  Filled 2022-02-23: qty 1

## 2022-02-23 MED ORDER — POLYETHYLENE GLYCOL 3350 17 G PO PACK
17.0000 g | PACK | Freq: Every day | ORAL | Status: DC | PRN
  Administered 2022-02-24: 17 g via ORAL
  Filled 2022-02-23: qty 1

## 2022-02-23 MED ORDER — FENTANYL CITRATE (PF) 100 MCG/2ML IJ SOLN
INTRAMUSCULAR | Status: AC
  Filled 2022-02-23: qty 2

## 2022-02-23 MED ORDER — FUROSEMIDE 20 MG PO TABS: 20.0000 mg | ORAL_TABLET | Freq: Every day | ORAL | Status: DC | PRN

## 2022-02-23 MED ORDER — ONDANSETRON HCL 4 MG PO TABS: 4.0000 mg | ORAL_TABLET | Freq: Four times a day (QID) | ORAL | Status: DC | PRN

## 2022-02-23 MED ORDER — METHOCARBAMOL 500 MG PO TABS
500.0000 mg | ORAL_TABLET | Freq: Four times a day (QID) | ORAL | Status: DC | PRN
  Administered 2022-02-23 – 2022-02-25 (×6): 500 mg via ORAL
  Filled 2022-02-23 (×6): qty 1

## 2022-02-23 MED ORDER — ATORVASTATIN CALCIUM 20 MG PO TABS
20.0000 mg | ORAL_TABLET | Freq: Every day | ORAL | Status: DC
  Administered 2022-02-23: 20 mg via ORAL
  Filled 2022-02-23: qty 1

## 2022-02-23 MED ORDER — SODIUM CHLORIDE 0.9 % IV SOLN: INTRAVENOUS | Status: DC

## 2022-02-23 MED ORDER — PHENYLEPHRINE HCL-NACL 20-0.9 MG/250ML-% IV SOLN
INTRAVENOUS | Status: DC | PRN
  Administered 2022-02-23: 50 ug/min via INTRAVENOUS

## 2022-02-23 MED ORDER — PROPOFOL 10 MG/ML IV BOLUS
INTRAVENOUS | Status: DC | PRN
  Administered 2022-02-23 (×2): 20 mg via INTRAVENOUS

## 2022-02-23 MED ORDER — FENOFIBRATE 160 MG PO TABS
160.0000 mg | ORAL_TABLET | Freq: Every day | ORAL | Status: DC
  Administered 2022-02-24: 160 mg via ORAL
  Filled 2022-02-23: qty 1

## 2022-02-23 MED ORDER — ORAL CARE MOUTH RINSE: 15.0000 mL | Freq: Once | OROMUCOSAL | Status: AC

## 2022-02-23 MED ORDER — ATORVASTATIN CALCIUM 20 MG PO TABS
20.0000 mg | ORAL_TABLET | Freq: Every day | ORAL | Status: DC
Start: 2022-02-24 — End: 2022-02-25
  Administered 2022-02-24: 20 mg via ORAL
  Filled 2022-02-23: qty 1

## 2022-02-23 MED ORDER — DILTIAZEM HCL ER COATED BEADS 120 MG PO CP24
120.0000 mg | ORAL_CAPSULE | Freq: Every day | ORAL | Status: DC
  Administered 2022-02-23: 120 mg via ORAL
  Filled 2022-02-23: qty 1

## 2022-02-23 MED ORDER — HYDROMORPHONE HCL 1 MG/ML IJ SOLN
0.2500 mg | INTRAMUSCULAR | Status: DC | PRN
  Administered 2022-02-23 (×4): 0.25 mg via INTRAVENOUS

## 2022-02-23 MED ORDER — FENTANYL CITRATE (PF) 100 MCG/2ML IJ SOLN
INTRAMUSCULAR | Status: DC | PRN
  Administered 2022-02-23: 100 ug via INTRAVENOUS

## 2022-02-23 MED ORDER — STERILE WATER FOR IRRIGATION IR SOLN
Status: DC | PRN
  Administered 2022-02-23: 2000 mL

## 2022-02-23 MED ORDER — PHENOL 1.4 % MT LIQD: 1.0000 | OROMUCOSAL | Status: DC | PRN

## 2022-02-23 MED ORDER — TRANEXAMIC ACID-NACL 1000-0.7 MG/100ML-% IV SOLN
1000.0000 mg | INTRAVENOUS | Status: DC
  Filled 2022-02-23: qty 100

## 2022-02-23 MED ORDER — HYDROCODONE-ACETAMINOPHEN 5-325 MG PO TABS: 1.0000 | ORAL_TABLET | ORAL | Status: DC | PRN

## 2022-02-23 MED ORDER — ONDANSETRON HCL 4 MG/2ML IJ SOLN: 4.0000 mg | Freq: Four times a day (QID) | INTRAMUSCULAR | Status: DC | PRN

## 2022-02-23 MED ORDER — DEXAMETHASONE SODIUM PHOSPHATE 10 MG/ML IJ SOLN
INTRAMUSCULAR | Status: AC
  Filled 2022-02-23: qty 1

## 2022-02-23 MED ORDER — OXYCODONE HCL 5 MG/5ML PO SOLN: 5.0000 mg | Freq: Once | ORAL | Status: DC | PRN

## 2022-02-23 MED ORDER — METOCLOPRAMIDE HCL 5 MG/ML IJ SOLN: 5.0000 mg | Freq: Three times a day (TID) | INTRAMUSCULAR | Status: DC | PRN

## 2022-02-23 MED ORDER — BUPIVACAINE IN DEXTROSE 0.75-8.25 % IT SOLN
INTRATHECAL | Status: DC | PRN
Start: 2022-02-23 — End: 2022-02-23
  Administered 2022-02-23: 1.5 mL via INTRATHECAL

## 2022-02-23 MED ORDER — OXYCODONE HCL 5 MG PO TABS: 5.0000 mg | ORAL_TABLET | Freq: Once | ORAL | Status: DC | PRN

## 2022-02-23 MED ORDER — DOCUSATE SODIUM 100 MG PO CAPS
100.0000 mg | ORAL_CAPSULE | Freq: Two times a day (BID) | ORAL | Status: DC
  Administered 2022-02-23 – 2022-02-25 (×4): 100 mg via ORAL
  Filled 2022-02-23 (×4): qty 1

## 2022-02-23 MED ORDER — ONDANSETRON HCL 4 MG/2ML IJ SOLN
INTRAMUSCULAR | Status: DC | PRN
  Administered 2022-02-23: 4 mg via INTRAVENOUS

## 2022-02-23 MED ORDER — MIDAZOLAM HCL 2 MG/2ML IJ SOLN
INTRAMUSCULAR | Status: AC
  Filled 2022-02-23: qty 2

## 2022-02-23 MED ORDER — CEFAZOLIN SODIUM-DEXTROSE 2-4 GM/100ML-% IV SOLN
2.0000 g | Freq: Four times a day (QID) | INTRAVENOUS | Status: AC
  Administered 2022-02-23 – 2022-02-24 (×2): 2 g via INTRAVENOUS
  Filled 2022-02-23 (×2): qty 100

## 2022-02-23 MED ORDER — PROPOFOL 1000 MG/100ML IV EMUL
INTRAVENOUS | Status: AC
  Filled 2022-02-23: qty 100

## 2022-02-23 MED ORDER — DIPHENHYDRAMINE HCL 12.5 MG/5ML PO ELIX: 12.5000 mg | ORAL_SOLUTION | ORAL | Status: DC | PRN

## 2022-02-23 MED ORDER — ACETAMINOPHEN 325 MG PO TABS: 325.0000 mg | ORAL_TABLET | Freq: Four times a day (QID) | ORAL | Status: DC | PRN

## 2022-02-23 MED ORDER — MENTHOL 3 MG MT LOZG: 1.0000 | LOZENGE | OROMUCOSAL | Status: DC | PRN

## 2022-02-23 MED ORDER — CELECOXIB 200 MG PO CAPS
200.0000 mg | ORAL_CAPSULE | Freq: Two times a day (BID) | ORAL | Status: DC
  Administered 2022-02-23 – 2022-02-25 (×4): 200 mg via ORAL
  Filled 2022-02-23 (×4): qty 1

## 2022-02-23 MED ORDER — POVIDONE-IODINE 10 % EX SWAB
2.0000 "application " | Freq: Once | CUTANEOUS | Status: AC
  Administered 2022-02-23: 2 via TOPICAL

## 2022-02-23 MED ORDER — FERROUS SULFATE 325 (65 FE) MG PO TABS
325.0000 mg | ORAL_TABLET | Freq: Three times a day (TID) | ORAL | Status: DC
  Administered 2022-02-23 – 2022-02-25 (×5): 325 mg via ORAL
  Filled 2022-02-23 (×5): qty 1

## 2022-02-23 SURGICAL SUPPLY — 46 items
BAG COUNTER SPONGE SURGICOUNT (BAG) ×1 IMPLANT
BAG DECANTER FOR FLEXI CONT (MISCELLANEOUS) IMPLANT
BAG ZIPLOCK 12X15 (MISCELLANEOUS) IMPLANT
BALL HIP CERAMIC (Hips) IMPLANT
BLADE SAG 18X100X1.27 (BLADE) ×2 IMPLANT
COVER PERINEAL POST (MISCELLANEOUS) ×2 IMPLANT
COVER SURGICAL LIGHT HANDLE (MISCELLANEOUS) ×2 IMPLANT
CUP ACET PINNACLE SECTR 50MM (Hips) IMPLANT
DERMABOND ADVANCED (GAUZE/BANDAGES/DRESSINGS) ×1
DERMABOND ADVANCED .7 DNX12 (GAUZE/BANDAGES/DRESSINGS) ×1 IMPLANT
DRAPE FOOT SWITCH (DRAPES) ×2 IMPLANT
DRAPE STERI IOBAN 125X83 (DRAPES) ×2 IMPLANT
DRAPE U-SHAPE 47X51 STRL (DRAPES) ×4 IMPLANT
DRESSING AQUACEL AG SP 3.5X10 (GAUZE/BANDAGES/DRESSINGS) ×1 IMPLANT
DRSG AQUACEL AG SP 3.5X10 (GAUZE/BANDAGES/DRESSINGS) ×2
DURAPREP 26ML APPLICATOR (WOUND CARE) ×2 IMPLANT
ELECT REM PT RETURN 15FT ADLT (MISCELLANEOUS) ×2 IMPLANT
ELIMINATOR HOLE APEX DEPUY (Hips) ×1 IMPLANT
GLOVE BIO SURGEON STRL SZ 6 (GLOVE) ×4 IMPLANT
GLOVE BIO SURGEON STRL SZ7.5 (GLOVE) ×2 IMPLANT
GLOVE BIOGEL PI IND STRL 6.5 (GLOVE) ×1 IMPLANT
GLOVE BIOGEL PI IND STRL 7.5 (GLOVE) ×1 IMPLANT
GLOVE BIOGEL PI INDICATOR 6.5 (GLOVE) ×1
GLOVE BIOGEL PI INDICATOR 7.5 (GLOVE) ×1
GOWN STRL REUS W/ TWL LRG LVL3 (GOWN DISPOSABLE) ×2 IMPLANT
GOWN STRL REUS W/TWL LRG LVL3 (GOWN DISPOSABLE) ×4
HIP BALL CERAMIC (Hips) ×2 IMPLANT
HOLDER FOLEY CATH W/STRAP (MISCELLANEOUS) ×2 IMPLANT
KIT TURNOVER KIT A (KITS) IMPLANT
LINER ACET PNNCL PLUS4 NEUTRAL (Hips) IMPLANT
PACK ANTERIOR HIP CUSTOM (KITS) ×2 IMPLANT
PINNACLE PLUS 4 NEUTRAL (Hips) ×2 IMPLANT
PINNACLE SECTOR CUP 50MM (Hips) ×2 IMPLANT
SCREW 6.5MMX30MM (Screw) ×1 IMPLANT
SPONGE T-LAP 18X18 ~~LOC~~+RFID (SPONGE) ×6 IMPLANT
STEM FEM ACTIS HIGH SZ2 (Stem) ×1 IMPLANT
SUT MNCRL AB 4-0 PS2 18 (SUTURE) ×2 IMPLANT
SUT STRATAFIX 0 PDS 27 VIOLET (SUTURE) ×2
SUT VIC AB 1 CT1 36 (SUTURE) ×6 IMPLANT
SUT VIC AB 2-0 CT1 27 (SUTURE) ×4
SUT VIC AB 2-0 CT1 TAPERPNT 27 (SUTURE) ×2 IMPLANT
SUTURE STRATFX 0 PDS 27 VIOLET (SUTURE) ×1 IMPLANT
TRAY FOLEY MTR SLVR 16FR STAT (SET/KITS/TRAYS/PACK) IMPLANT
TRAY FOLEY SLVR 14FR TEMP STAT (SET/KITS/TRAYS/PACK) ×1 IMPLANT
TUBE SUCTION HIGH CAP CLEAR NV (SUCTIONS) ×2 IMPLANT
WATER STERILE IRR 1000ML POUR (IV SOLUTION) ×2 IMPLANT

## 2022-02-23 NOTE — Interval H&P Note (Signed)
History and Physical Interval Note: ? ?02/23/2022 ?11:29 AM ? ?Alison Best  has presented today for surgery, with the diagnosis of Left hip osteoarthritis.  The various methods of treatment have been discussed with the patient and family. After consideration of risks, benefits and other options for treatment, the patient has consented to  Procedure(s): ?TOTAL HIP ARTHROPLASTY ANTERIOR APPROACH (Left) as a surgical intervention.  The patient's history has been reviewed, patient examined, no change in status, stable for surgery.  I have reviewed the patient's chart and labs.  Questions were answered to the patient's satisfaction.   ? ? ?Mauri Pole ? ? ?

## 2022-02-23 NOTE — Progress Notes (Signed)
Pt left wrist Iv infiltrated redness around iv insertion only. About 49m of Vancomycin left in bag. Iv d/c cath intact. ICE applied. Will continue to monitor. ?

## 2022-02-23 NOTE — Plan of Care (Signed)
Plan of care reviewed and discussed with the patient. 

## 2022-02-23 NOTE — Discharge Instructions (Signed)

## 2022-02-23 NOTE — Anesthesia Preprocedure Evaluation (Signed)
Anesthesia Evaluation  ?Patient identified by MRN, date of birth, ID band ?Patient awake ? ? ? ?Reviewed: ?Allergy & Precautions, NPO status , Patient's Chart, lab work & pertinent test results ? ?Airway ?Mallampati: II ? ?TM Distance: >3 FB ?Neck ROM: Full ? ? ? Dental ? ?(+) Teeth Intact ?  ?Pulmonary ?asthma , sleep apnea , COPD, former smoker,  ?  ?breath sounds clear to auscultation ? ? ? ? ? ? Cardiovascular ?+ Peripheral Vascular Disease  ? ?Rhythm:Regular Rate:Normal ? ? ?  ?Neuro/Psych ? Headaches,   ? GI/Hepatic ?GERD  ,  ?Endo/Other  ?Hyperthyroidism  ? Renal/GU ?  ? ?  ?Musculoskeletal ? ?(+) Arthritis , Osteoarthritis,   ? Abdominal ?(+) + obese,   ?Peds ? Hematology ?  ?Anesthesia Other Findings ? ? Reproductive/Obstetrics ? ?  ? ? ? ? ? ? ? ? ? ? ? ? ? ?  ?  ? ? ? ? ? ? ? ? ?Anesthesia Physical ? ?Anesthesia Plan ? ?ASA: III ? ?Anesthesia Plan: Spinal  ? ?Post-op Pain Management: Regional block*  ? ?Induction: Intravenous ? ?PONV Risk Score and Plan: 1 and Ondansetron, Propofol infusion and Treatment may vary due to age or medical condition ? ?Airway Management Planned: Simple Face Mask ? ?Additional Equipment:  ? ?Intra-op Plan:  ? ?Post-operative Plan:  ? ?Informed Consent: I have reviewed the patients History and Physical, chart, labs and discussed the procedure including the risks, benefits and alternatives for the proposed anesthesia with the patient or authorized representative who has indicated his/her understanding and acceptance.  ? ? ? ?Dental advisory given ? ?Plan Discussed with: CRNA and Surgeon ? ?Anesthesia Plan Comments:   ? ? ? ? ? ? ?Anesthesia Quick Evaluation ? ?

## 2022-02-23 NOTE — Transfer of Care (Signed)
Immediate Anesthesia Transfer of Care Note ? ?Patient: Alison Best ? ?Procedure(s) Performed: TOTAL HIP ARTHROPLASTY ANTERIOR APPROACH (Left: Hip) ? ?Patient Location: PACU ? ?Anesthesia Type:Spinal ? ?Level of Consciousness: awake, alert  and oriented ? ?Airway & Oxygen Therapy: Patient Spontanous Breathing and Patient connected to face mask oxygen ? ?Post-op Assessment: Report given to RN and Post -op Vital signs reviewed and stable ? ?Post vital signs: Reviewed and stable ? ?Last Vitals:  ?Vitals Value Taken Time  ?BP 91/75 02/23/22 1435  ?Temp    ?Pulse 64 02/23/22 1437  ?Resp 15 02/23/22 1437  ?SpO2 100 % 02/23/22 1437  ?Vitals shown include unvalidated device data. ? ?Last Pain:  ?Vitals:  ? 02/23/22 1100  ?TempSrc:   ?PainSc: 6   ?   ? ?Patients Stated Pain Goal: 5 (02/23/22 1100) ? ?Complications: No notable events documented. ?

## 2022-02-23 NOTE — Anesthesia Postprocedure Evaluation (Signed)
Anesthesia Post Note ? ?Patient: Alison Best ? ?Procedure(s) Performed: TOTAL HIP ARTHROPLASTY ANTERIOR APPROACH (Left: Hip) ? ?  ? ?Patient location during evaluation: PACU ?Anesthesia Type: Spinal ?Level of consciousness: awake and alert ?Pain management: pain level controlled ?Vital Signs Assessment: post-procedure vital signs reviewed and stable ?Respiratory status: spontaneous breathing, nonlabored ventilation and respiratory function stable ?Cardiovascular status: blood pressure returned to baseline and stable ?Postop Assessment: no apparent nausea or vomiting ?Anesthetic complications: no ? ? ?No notable events documented. ? ?Last Vitals:  ?Vitals:  ? 02/23/22 1545 02/23/22 1600  ?BP: (!) 114/51 127/60  ?Pulse: 72 74  ?Resp: 18 16  ?Temp:    ?SpO2: 98% 96%  ?  ?Last Pain:  ?Vitals:  ? 02/23/22 1545  ?TempSrc:   ?PainSc: 2   ? ? ?  ?  ?  ?  ?  ?  ? ?Lynda Rainwater ? ? ? ? ?

## 2022-02-23 NOTE — Op Note (Signed)
? NAME:  Alison Best                ACCOUNT NO.: [id number]  ?   ? MEDICAL RECORD NO.: [id number]  ?   ? FACILITY:  [facility]  ?   ? PHYSICIAN:  Mauri Pole  DATE OF BIRTH:  05/11/1956 ?   ? DATE OF PROCEDURE:  02/23/2022 ?   ?                             OPERATIVE REPORT  ?   ?   ? PREOPERATIVE DIAGNOSIS: Left  hip osteoarthritis.  ?   ? POSTOPERATIVE DIAGNOSIS:  Left hip osteoarthritis.  ?   ? PROCEDURE:  Left total hip replacement through an anterior approach  ? utilizing DePuy THR system, component size 50 mm pinnacle cup, a size 32+4 neutral  ? Altrex liner, a size 2 Hi Actis stem with a 32+5 delta ceramic  ? ball.  ?   ? SURGEON:  Pietro Cassis. Alvan Dame, M.D.  ?   ? ASSISTANT:  Costella Hatcher, PA-C ?   ? ANESTHESIA:  Spinal.  ?   ? SPECIMENS:  None.  ?   ? COMPLICATIONS:  None.  ?   ? BLOOD LOSS:  425 cc ?   ? DRAINS:  None.  ?   ? INDICATION OF THE PROCEDURE:  Alison Best is a 66 y.o. female who had  ? presented to office for evaluation of left hip pain.  Radiographs revealed  ? progressive degenerative changes with bone-on-bone  ? articulation of the  hip joint, including subchondral cystic changes and osteophytes.  The patient had painful limited range of  ? motion significantly affecting their overall quality of life and function.  The patient was failing to   ? respond to conservative measures including medications and/or injections and activity modification and at this point was ready  ? to proceed with more definitive measures.  Consent was obtained for  ? benefit of pain relief.  Specific risks of infection, DVT, component  ? failure, dislocation, neurovascular injury, and need for revision surgery were reviewed in the office. ?   ? PROCEDURE IN DETAIL:  The patient was brought to operative theater.  ? Once adequate anesthesia, preoperative antibiotics, 2 gm of Ancef, 1 gm of Tranexamic Acid, and 10 mg of Decadron were administered, the patient was positioned  supine on the [employer] table.  Once the patient was safely positioned with adequate padding of boney prominences we predraped out the hip, and used fluoroscopy to confirm orientation of the pelvis.  ?   ? The left hip was then prepped and draped from proximal iliac crest to  ? mid thigh with a shower curtain technique.  ?   ? Time-out was performed identifying the patient, planned procedure, and the appropriate extremity.   ? ? An incision was then made 2 cm lateral to the  ? anterior superior iliac spine extending over the orientation of the  ? tensor fascia lata muscle and sharp dissection was carried down to the  ? fascia of the muscle.  ?   ? The fascia was then incised.  The muscle belly was identified and swept  ? laterally and retractor placed along the superior neck.  Following  ? cauterization of the circumflex vessels and removing some pericapsular  ? fat, a second cobra retractor was placed on the inferior  neck.  A T-capsulotomy was made along the line of the  ? superior neck to the trochanteric fossa, then extended proximally and  ? distally.  Tag sutures were placed and the retractors were then placed  ? intracapsular.  We then identified the trochanteric fossa and  ? orientation of my neck cut and then made a neck osteotomy with the femur on traction.  The femoral  ? head was removed without difficulty or complication.  Traction was let  ? off and retractors were placed posterior and anterior around the  ? acetabulum.  ?   ? The labrum and foveal tissue were debrided.  I began reaming with a 45 mm  ? reamer and reamed up to 49 mm reamer with good bony bed preparation and a 50 mm ? cup was chosen.  The final 50 mm Pinnacle cup was then impacted under fluoroscopy to confirm the depth of penetration and orientation with respect to  ? Abduction and forward flexion.  A screw was placed into the ilium followed by the hole eliminator.  The final  ? 32+4 neutral Altrex liner was impacted with good visualized  rim fit.  The cup was positioned anatomically within the acetabular portion of the pelvis.  ?   ? At this point, the femur was rolled to 100 degrees.  Further capsule was  ? released off the inferior aspect of the femoral neck.  I then  ? released the superior capsule proximally.  With the leg in a neutral position the hook was placed laterally  ? along the femur under the vastus lateralis origin and elevated manually and then held in position using the hook attachment on the bed.  The leg was then extended and adducted with the leg rolled to 100  ? degrees of external rotation.  Retractors were placed along the medial calcar and posteriorly over the greater trochanter.  Once the proximal femur was fully  ? exposed, I used a box osteotome to set orientation.  I then began  ? broaching with the starting chili pepper broach and passed this by hand and then broached up to 2.  With the 2 broach in place I chose a high offset neck and did several trial reductions.  I decided on the +5 head ball to provide stability with hip.  There was some shuck noted in the neutral position in addition to concerns over abdomen and significant abdominal hernia increasing risk for leverage induced instability.  ? Given these findings, I went ahead and dislocated the hip, repositioned all  ? retractors and positioned the right hip in the extended and abducted position.  ?The final 2 Hi Actis stem was  ? chosen and it was impacted down to the level of neck cut.  Based on this  ? and the trial reductions, a final 32+5 delta ceramic ball was chosen and  ? impacted onto a clean and dry trunnion, and the hip was reduced.  The  ? hip had been irrigated throughout the case again at this point.  I did  ? reapproximate the superior capsular leaflet to the anterior leaflet  ? using #1 Vicryl.  The fascia of the  ? tensor fascia lata muscle was then reapproximated using #1 Vicryl and #0 Stratafix sutures.  The  ? remaining wound was closed with 2-0  Vicryl and running 4-0 Monocryl.  ? The hip was cleaned, dried, and dressed sterilely using Dermabond and  ? Aquacel dressing.  The patient was then brought  ?  to recovery room in stable condition tolerating the procedure well.  ? ? Costella Hatcher, PA-C was present for the entirety of the case involved from  ? preoperative positioning, perioperative retractor management, general  ? facilitation of the case, as well as primary wound closure as assistant.  ?   ?   ?   ? Pietro Cassis Alvan Dame, M.D.  ?   ?   ?02/23/2022 12:57 PM  ?   ?   ?

## 2022-02-23 NOTE — Anesthesia Procedure Notes (Signed)
Spinal ? ?Patient location during procedure: OR ?End time: 02/23/2022 1:00 PM ?Reason for block: surgical anesthesia ?Staffing ?Performed: resident/CRNA  ?Resident/CRNA: Marijo Conception, CRNA ?Preanesthetic Checklist ?Completed: patient identified, IV checked, site marked, risks and benefits discussed, surgical consent, monitors and equipment checked, pre-op evaluation and timeout performed ?Spinal Block ?Patient position: sitting ?Prep: DuraPrep ?Patient monitoring: heart rate, continuous pulse ox and blood pressure ?Approach: midline ?Location: L3-4 ?Injection technique: single-shot ?Needle ?Needle type: Pencan  ?Needle gauge: 24 G ?Needle length: 9 cm ?Assessment ?Sensory level: T6 ?Events: CSF return ? ? ? ?

## 2022-02-24 ENCOUNTER — Encounter (HOSPITAL_COMMUNITY): Payer: Self-pay | Admitting: Orthopedic Surgery

## 2022-02-24 DIAGNOSIS — J449 Chronic obstructive pulmonary disease, unspecified: Secondary | ICD-10-CM | POA: Diagnosis not present

## 2022-02-24 DIAGNOSIS — F17210 Nicotine dependence, cigarettes, uncomplicated: Secondary | ICD-10-CM | POA: Diagnosis not present

## 2022-02-24 DIAGNOSIS — Z7984 Long term (current) use of oral hypoglycemic drugs: Secondary | ICD-10-CM | POA: Diagnosis not present

## 2022-02-24 DIAGNOSIS — Z8616 Personal history of COVID-19: Secondary | ICD-10-CM | POA: Diagnosis not present

## 2022-02-24 DIAGNOSIS — R7303 Prediabetes: Secondary | ICD-10-CM | POA: Diagnosis not present

## 2022-02-24 DIAGNOSIS — M1612 Unilateral primary osteoarthritis, left hip: Secondary | ICD-10-CM | POA: Diagnosis not present

## 2022-02-24 DIAGNOSIS — Z79899 Other long term (current) drug therapy: Secondary | ICD-10-CM | POA: Diagnosis not present

## 2022-02-24 DIAGNOSIS — J45909 Unspecified asthma, uncomplicated: Secondary | ICD-10-CM | POA: Diagnosis not present

## 2022-02-24 LAB — CBC
HCT: 32 % — ABNORMAL LOW (ref 36.0–46.0)
Hemoglobin: 10.8 g/dL — ABNORMAL LOW (ref 12.0–15.0)
MCH: 31.6 pg (ref 26.0–34.0)
MCHC: 33.8 g/dL (ref 30.0–36.0)
MCV: 93.6 fL (ref 80.0–100.0)
Platelets: 237 10*3/uL (ref 150–400)
RBC: 3.42 MIL/uL — ABNORMAL LOW (ref 3.87–5.11)
RDW: 14.2 % (ref 11.5–15.5)
WBC: 12.3 10*3/uL — ABNORMAL HIGH (ref 4.0–10.5)
nRBC: 0 % (ref 0.0–0.2)

## 2022-02-24 LAB — BASIC METABOLIC PANEL
Anion gap: 5 (ref 5–15)
BUN: 11 mg/dL (ref 8–23)
CO2: 20 mmol/L — ABNORMAL LOW (ref 22–32)
Calcium: 6.7 mg/dL — ABNORMAL LOW (ref 8.9–10.3)
Chloride: 119 mmol/L — ABNORMAL HIGH (ref 98–111)
Creatinine, Ser: 0.69 mg/dL (ref 0.44–1.00)
GFR, Estimated: >60 mL/min (ref >60)
Glucose, Bld: 149 mg/dL — ABNORMAL HIGH (ref 70–99)
Potassium: 3 mmol/L — ABNORMAL LOW (ref 3.5–5.1)
Sodium: 144 mmol/L (ref 135–145)

## 2022-02-24 MED ORDER — ASPIRIN 81 MG PO CHEW: 81.0000 mg | CHEWABLE_TABLET | Freq: Two times a day (BID) | ORAL | 0 refills | Status: AC

## 2022-02-24 MED ORDER — CELECOXIB 200 MG PO CAPS: 200.0000 mg | ORAL_CAPSULE | Freq: Two times a day (BID) | ORAL | 0 refills | Status: DC

## 2022-02-24 MED ORDER — DOCUSATE SODIUM 100 MG PO CAPS
100.0000 mg | ORAL_CAPSULE | Freq: Two times a day (BID) | ORAL | 0 refills | Status: DC
Start: 2022-02-24 — End: 2023-04-06

## 2022-02-24 MED ORDER — METHOCARBAMOL 500 MG PO TABS: 500.0000 mg | ORAL_TABLET | Freq: Four times a day (QID) | ORAL | 0 refills | Status: DC | PRN

## 2022-02-24 MED ORDER — POLYETHYLENE GLYCOL 3350 17 G PO PACK: 17.0000 g | PACK | Freq: Every day | ORAL | 0 refills | Status: AC | PRN

## 2022-02-24 MED ORDER — POTASSIUM CHLORIDE CRYS ER 20 MEQ PO TBCR
40.0000 meq | EXTENDED_RELEASE_TABLET | Freq: Once | ORAL | Status: AC
  Administered 2022-02-24: 40 meq via ORAL
  Filled 2022-02-24: qty 2

## 2022-02-24 MED ORDER — CEFADROXIL 500 MG PO CAPS
500.0000 mg | ORAL_CAPSULE | Freq: Two times a day (BID) | ORAL | 0 refills | Status: AC
Start: 2022-02-24 — End: 2022-03-03

## 2022-02-24 NOTE — Evaluation (Signed)
Physical Therapy Evaluation ?Patient Details ?Name: Alison Best ?MRN: [id number] ?DOB: Dec 28, 1955 ?Today's Date: 02/24/2022 ? ?History of Present Illness ? Pt s/p L THR secondary to OA. PMH includes exploratory laparotomy, sigmoid colectomy, left salpingo-opphorectomy, descending colostomy, large hiatal hernia.  ?Clinical Impression ? Pt presents with dependencies in mobility secondary to the above diagnosis. Pt was able to tolerate L hip exercises with verbal cues, pt ambulated in the hall 150f with min guard assist. Pt verbalized she felt like her leg length was different and it required her to stand with her left knee bent. Pt did have LE weakness noted in her gait during advancement of the L LE. Pt will have assistance from her spouse at d/c. Recommend continued acute skilled PT to maximize mobility and independence for d/c home. Will plan for BID treatment today to address mobility and steps for possibly d/c home today. ?   ? ?Recommendations for follow up therapy are one component of a multi-disciplinary discharge planning process, led by the attending physician.  Recommendations may be updated based on patient status, additional functional criteria and insurance authorization. ? ?Follow Up Recommendations Follow physician's recommendations for discharge plan and follow up therapies ? ?  ?Assistance Recommended at Discharge Intermittent Supervision/Assistance  ?Patient can return home with the following ? Assistance with cooking/housework;Assist for transportation;A little help with walking and/or transfers;Help with stairs or ramp for entrance ? ?  ?Equipment Recommendations None recommended by PT  ?Recommendations for Other Services ?    ?  ?Functional Status Assessment Patient has had a recent decline in their functional status and demonstrates the ability to make significant improvements in function in a reasonable and predictable amount of time.  ? ?  ?Precautions / Restrictions  Precautions ?Precautions: Anterior Hip ?Restrictions ?Weight Bearing Restrictions: No  ? ?  ? ?Mobility ? Bed Mobility ?Overal bed mobility: Needs Assistance ?Bed Mobility: Supine to Sit ?  ?  ?Supine to sit: Min assist, HOB elevated ?  ?  ?General bed mobility comments: trunk support, roll to left side and instructed to provide abd support for hernia and use of UE more to dec abd strain ?Patient Response: Cooperative ? ?Transfers ?Overall transfer level: Needs assistance ?Equipment used: Rolling walker (2 wheels) ?Transfers: Sit to/from Stand ?Sit to Stand: Min guard ?  ?  ?  ?  ?  ?General transfer comment: cues for hand placement ?  ? ?Ambulation/Gait ?Ambulation/Gait assistance: Min guard ?Gait Distance (Feet): 125 Feet ?Assistive device: Rolling walker (2 wheels) ?Gait Pattern/deviations: Step-through pattern, Decreased stride length ?Gait velocity: decreased ?  ?  ?  ? ?Stairs ?  ?  ?  ?  ?  ? ?Wheelchair Mobility ?  ? ?Modified Rankin (Stroke Patients Only) ?  ? ?  ? ?Balance Overall balance assessment: No apparent balance deficits (not formally assessed) ?  ?  ?  ?  ?  ?  ?  ?  ?  ?  ?  ?  ?  ?  ?  ?  ?  ?  ?   ? ? ? ?Pertinent Vitals/Pain Pain Assessment ?Pain Assessment: 0-10 ?Pain Score: 5  ?Pain Location: Left hip ?Pain Descriptors / Indicators: Discomfort ?Pain Intervention(s): Limited activity within patient's tolerance, Repositioned, Monitored during session  ? ? ?Home Living Family/patient expects to be discharged to:: Private residence ?Living Arrangements: Spouse/significant other ?Available Help at Discharge: Family ?Type of Home: House ?Home Access: Stairs to enter ?Entrance Stairs-Rails: Right;Left;Can reach both ?Entrance Stairs-Number of Steps: 4 ?  ?  Home Layout: One level ?Home Equipment: Rollator (4 wheels) ?   ?  ?Prior Function Prior Level of Function : Independent/Modified Independent ?  ?  ?  ?  ?  ?  ?  ?  ?  ? ? ?Hand Dominance  ?   ? ?  ?Extremity/Trunk Assessment  ? Upper Extremity  Assessment ?Upper Extremity Assessment: Defer to OT evaluation ?  ? ?Lower Extremity Assessment ?Lower Extremity Assessment: LLE deficits/detail ?LLE Deficits / Details: strength 2-3/5 ?  ? ?Cervical / Trunk Assessment ?Cervical / Trunk Assessment: Normal  ?Communication  ? Communication: No difficulties  ?Cognition Arousal/Alertness: Awake/alert ?Behavior During Therapy: [facility] for tasks assessed/performed ?Overall Cognitive Status: Within Functional Limits for tasks assessed ?  ?  ?  ?  ?  ?  ?  ?  ?  ?  ?  ?  ?  ?  ?  ?  ?  ?  ?  ? ?  ?General Comments   ? ?  ?Exercises Total Joint Exercises ?Ankle Circles/Pumps: AROM, Strengthening, Both, 10 reps, Supine ?Quad Sets: AROM, Strengthening, Both, 10 reps, Supine ?Heel Slides: AROM, Strengthening, Left, 10 reps, Supine ?Hip ABduction/ADduction: AAROM, Strengthening, Left, 10 reps, Supine ?Long Arc Quad: AROM, Strengthening, Left, 10 reps, Seated  ? ?Assessment/Plan  ?  ?PT Assessment Patient needs continued PT services  ?PT Problem List Decreased strength;Decreased balance;Decreased knowledge of precautions;Pain;Decreased range of motion;Decreased mobility;Decreased activity tolerance;Decreased safety awareness ? ?   ?  ?PT Treatment Interventions DME instruction;Balance training;Functional mobility training;Patient/family education;Gait training;Therapeutic activities;Neuromuscular re-education;Stair training;Therapeutic exercise   ? ?PT Goals (Current goals can be found in the Care Plan section)  ?Acute Rehab PT Goals ?Patient Stated Goal: To return home when safe ?PT Goal Formulation: With patient ?Time For Goal Achievement: 03/03/22 ?Potential to Achieve Goals: Good ? ?  ?Frequency 7X/week ?  ? ? ?Co-evaluation   ?  ?  ?  ?  ? ? ?  ?AM-PAC PT "6 Clicks" Mobility  ?Outcome Measure Help needed turning from your back to your side while in a flat bed without using bedrails?: A Little ?Help needed moving from lying on your back to sitting on the side of a flat bed  without using bedrails?: A Little ?Help needed moving to and from a bed to a chair (including a wheelchair)?: A Little ?Help needed standing up from a chair using your arms (e.g., wheelchair or bedside chair)?: A Little ?Help needed to walk in hospital room?: A Little ?Help needed climbing 3-5 steps with a railing? : A Little ?6 Click Score: 18 ? ?  ?End of Session Equipment Utilized During Treatment: Gait belt ?Activity Tolerance: Patient tolerated treatment well ?Patient left: in chair;with nursing/sitter in room ?Nurse Communication: Mobility status ?PT Visit Diagnosis: Muscle weakness (generalized) (M62.81);Other abnormalities of gait and mobility (R26.89) ?  ? ?Time: [phone] ?PT Time Calculation (min) (ACUTE ONLY): 44 min ? ? ?Charges:   PT Evaluation ?$PT Eval Moderate Complexity: 1 Mod ?PT Treatments ?$Gait Training: 8-22 mins ?$Therapeutic Exercise: 8-22 mins ?  ?   ? ? ?Lelon Mast ?02/24/2022, 9:46 AM ? ?

## 2022-02-24 NOTE — TOC Transition Note (Signed)
Transition of Care (TOC) - CM/SW Discharge Note ? ?Patient Details  ?Name: Alison Best ?MRN: [id number] ?Date of Birth: 11/15/1955 ? ?Transition of Care (TOC) CM/SW Contact:  ?Sherie Don, LCSW ?Phone Number: ?02/24/2022, 11:31 AM ? ?Clinical Narrative: Patient is expected to discharge home after working with PT. CSW met with patient to review discharge plan and needs. Patient will discharge home with a home exercise program (HEP). Patient will need a rolling walker and 3N1, which were delivered to patient's room by [employer]. TOC signing off. ? ?Final next level of care: Home/Self Care ?Barriers to Discharge: No Barriers Identified ? ?Patient Goals and CMS Choice ?Patient states their goals for this hospitalization and ongoing recovery are:: Discharge home with HEP ?CMS Medicare.gov Compare Post Acute Care list provided to:: Patient ?Choice offered to / list presented to : Patient ? ?Discharge Plan and Services        ?DME Arranged: 3-N-1, [employer] ?DME Agency: [employer] ?Representative spoke with at DME Agency: Prearranged in orthopedist's office ? ?Readmission Risk Interventions ?   ? View : No data to display.  ?  ?  ?  ? ?

## 2022-02-24 NOTE — Progress Notes (Signed)
Physical Therapy Treatment ?Patient Details ?Name: Alison Best ?MRN: [id number] ?DOB: 1956/01/09 ?Today's Date: 02/24/2022 ? ? ?History of Present Illness Pt s/p L THR secondary to OA. PMH includes exploratory laparotomy, sigmoid colectomy, left salpingo-opphorectomy, descending colostomy, large hiatal hernia. ? ?  ?PT Comments  ? ? Pt is making good progress with mobility. Pt is reluctant to d/c home today due to steps and wanting to reinforce mobility. Pt attempted step training today with min assist and was able to ambulate in the hall 170f with min guard assist. Reinforced HEP and safety goals. Pt would benefit from 1 more session of therapy in the morning to reinforce mobility and stair technique prior to d/c home with her spouse. I would recommend HHPT services.  ?Recommendations for follow up therapy are one component of a multi-disciplinary discharge planning process, led by the attending physician.  Recommendations may be updated based on patient status, additional functional criteria and insurance authorization. ? ?Follow Up Recommendations ? Follow physician's recommendations for discharge plan and follow up therapies ?  ?  ?Assistance Recommended at Discharge Intermittent Supervision/Assistance  ?Patient can return home with the following Assistance with cooking/housework;Assist for transportation;A little help with walking and/or transfers;Help with stairs or ramp for entrance ?  ?Equipment Recommendations ? None recommended by PT  ?  ?Recommendations for Other Services   ? ? ?  ?Precautions / Restrictions Precautions ?Precautions: Anterior Hip;Other (comment) ?Precaution Comments: large hiatal hernia  ?  ? ?Mobility ? Bed Mobility ?Overal bed mobility: Needs Assistance ?Bed Mobility: Supine to Sit ?  ?  ?Supine to sit: Min assist, HOB elevated ?  ?  ?General bed mobility comments: trunk support, roll to left side and instructed to provide abd support for hernia and use of UE more to dec abd  strain ?Patient Response: Cooperative ? ?Transfers ?Overall transfer level: Needs assistance ?Equipment used: Rolling walker (2 wheels) ?Transfers: Sit to/from Stand ?Sit to Stand: Min guard ?  ?  ?  ?  ?  ?General transfer comment: cues for hand placement ?  ? ?Ambulation/Gait ?Ambulation/Gait assistance: Min guard ?Gait Distance (Feet): 145 Feet ?Assistive device: Rolling walker (2 wheels) ?Gait Pattern/deviations: Step-through pattern, Decreased stride length ?Gait velocity: decreased ?  ?  ?  ? ? ?Stairs ?Stairs: Yes ?Stairs assistance: Min assist ?Stair Management: One rail Left, Sideways, With cane ?Number of Stairs: 3 ?General stair comments: Practiced steps with 1 rail/Quad cane and also sideways with 1 rail. Pt reported the method without the cane was easier. ? ? ?Wheelchair Mobility ?  ? ?Modified Rankin (Stroke Patients Only) ?  ? ? ?  ?Balance Overall balance assessment: No apparent balance deficits (not formally assessed) ?  ?  ?  ?  ?  ?  ?  ?  ?  ?  ?  ?  ?  ?  ?  ?  ?  ?  ?  ? ?  ?Cognition Arousal/Alertness: Awake/alert ?Behavior During [facility]: [facility] assessed/performed ?Overall Cognitive Status: Within Functional Limits for tasks assessed ?  ?  ?  ?  ?  ?  ?  ?  ?  ?  ?  ?  ?  ?  ?  ?  ?  ?  ?  ? ?  ?Exercises Total Joint Exercises ?Ankle Circles/Pumps: AROM, Strengthening, Both, 10 reps, Supine ?Quad Sets: AROM, Strengthening, Both, 10 reps, Supine ?Heel Slides: AROM, Strengthening, Left, 10 reps, Supine ?Hip ABduction/ADduction: AAROM, Strengthening, Left, 10 reps, Supine ?Long Arc Quad:  AROM, Strengthening, Left, 10 reps, Seated ? ?  ?General Comments General comments (skin integrity, edema, etc.): Pt reports she would feel better d/c home tomorrow after receiving more therapy and also having a BM. Pt has a hernia which put stress and pressure on her that she does not want to d/c too soon. ?  ?  ? ?Pertinent Vitals/Pain Pain Assessment ?Pain Assessment: 0-10 ?Pain Score: 4  ?Pain  Location: Left hip ?Pain Descriptors / Indicators: Discomfort ?Pain Intervention(s): Limited activity within patient's tolerance, Repositioned, Monitored during session, Premedicated before session  ? ? ?Home Living   ?  ?  ?  ?  ?  ?  ?  ?  ?  ?   ?  ?Prior Function    ?  ?  ?   ? ?PT Goals (current goals can now be found in the care plan section) Progress towards PT goals: Progressing toward goals ? ?  ?Frequency ? ? ? 7X/week ? ? ? ?  ?PT Plan Current plan remains appropriate  ? ? ?Co-evaluation   ?  ?  ?  ?  ? ?  ?AM-PAC PT "6 Clicks" Mobility   ?Outcome Measure ? Help needed turning from your back to your side while in a flat bed without using bedrails?: A Little ?Help needed moving from lying on your back to sitting on the side of a flat bed without using bedrails?: A Little ?Help needed moving to and from a bed to a chair (including a wheelchair)?: A Little ?Help needed standing up from a chair using your arms (e.g., wheelchair or bedside chair)?: A Little ?Help needed to walk in hospital room?: A Little ?Help needed climbing 3-5 steps with a railing? : A Little ?6 Click Score: 18 ? ?  ?End of Session Equipment Utilized During Treatment: Gait belt ?Activity Tolerance: Patient tolerated treatment well ?Patient left: in bed;with call bell/phone within reach ?Nurse Communication: Mobility status ?PT Visit Diagnosis: Muscle weakness (generalized) (M62.81);Other abnormalities of gait and mobility (R26.89) ?  ? ? ?Time: [id number] ?PT Time Calculation (min) (ACUTE ONLY): 31 min ? ?Charges:  $Gait Training: 8-22 mins ?$Therapeutic Activity: 8-22 mins          ?         ? ? ?Lelon Mast ?02/24/2022, 2:26 PM ? ?

## 2022-02-25 DIAGNOSIS — J45909 Unspecified asthma, uncomplicated: Secondary | ICD-10-CM | POA: Diagnosis not present

## 2022-02-25 DIAGNOSIS — Z79899 Other long term (current) drug therapy: Secondary | ICD-10-CM | POA: Diagnosis not present

## 2022-02-25 DIAGNOSIS — Z8616 Personal history of COVID-19: Secondary | ICD-10-CM | POA: Diagnosis not present

## 2022-02-25 DIAGNOSIS — R7303 Prediabetes: Secondary | ICD-10-CM | POA: Diagnosis not present

## 2022-02-25 DIAGNOSIS — J449 Chronic obstructive pulmonary disease, unspecified: Secondary | ICD-10-CM | POA: Diagnosis not present

## 2022-02-25 DIAGNOSIS — F17210 Nicotine dependence, cigarettes, uncomplicated: Secondary | ICD-10-CM | POA: Diagnosis not present

## 2022-02-25 DIAGNOSIS — M1612 Unilateral primary osteoarthritis, left hip: Secondary | ICD-10-CM | POA: Diagnosis not present

## 2022-02-25 DIAGNOSIS — Z96642 Presence of left artificial hip joint: Secondary | ICD-10-CM | POA: Diagnosis not present

## 2022-02-25 DIAGNOSIS — Z7984 Long term (current) use of oral hypoglycemic drugs: Secondary | ICD-10-CM | POA: Diagnosis not present

## 2022-02-25 LAB — CBC
HCT: 35.2 % — ABNORMAL LOW (ref 36.0–46.0)
Hemoglobin: 12 g/dL (ref 12.0–15.0)
MCH: 31.5 pg (ref 26.0–34.0)
MCHC: 34.1 g/dL (ref 30.0–36.0)
MCV: 92.4 fL (ref 80.0–100.0)
Platelets: 283 10*3/uL (ref 150–400)
RBC: 3.81 MIL/uL — ABNORMAL LOW (ref 3.87–5.11)
RDW: 14.5 % (ref 11.5–15.5)
WBC: 19.3 10*3/uL — ABNORMAL HIGH (ref 4.0–10.5)
nRBC: 0 % (ref 0.0–0.2)

## 2022-02-25 LAB — BASIC METABOLIC PANEL
Anion gap: 6 (ref 5–15)
BUN: 19 mg/dL (ref 8–23)
CO2: 27 mmol/L (ref 22–32)
Calcium: 9.2 mg/dL (ref 8.9–10.3)
Chloride: 110 mmol/L (ref 98–111)
Creatinine, Ser: 0.84 mg/dL (ref 0.44–1.00)
GFR, Estimated: >60 mL/min (ref >60)
Glucose, Bld: 131 mg/dL — ABNORMAL HIGH (ref 70–99)
Potassium: 4.2 mmol/L (ref 3.5–5.1)
Sodium: 143 mmol/L (ref 135–145)

## 2022-02-25 NOTE — Plan of Care (Signed)
Discharge instructions given to the patient including medications. ?

## 2022-02-25 NOTE — Progress Notes (Signed)
Physical Therapy Treatment ?Patient Details ?Name: Alison Best ?MRN: [id number] ?DOB: 03-Sep-1956 ?Today's Date: 02/25/2022 ? ? ?History of Present Illness Pt s/p L THR secondary to OA. PMH includes exploratory laparotomy, sigmoid colectomy, left salpingo-opphorectomy, descending colostomy, large hiatal hernia. ? ?  ?PT Comments  ? ? Pt is progressing well with mobility. She ambulated 140' with RW, completed stair training, and demonstrates good understanding of HEP. She is ready to DC home from a PT standpoint.  ?   ?Recommendations for follow up therapy are one component of a multi-disciplinary discharge planning process, led by the attending physician.  Recommendations may be updated based on patient status, additional functional criteria and insurance authorization. ? ?Follow Up Recommendations ? Follow physician's recommendations for discharge plan and follow up therapies ?  ?  ?Assistance Recommended at Discharge Intermittent Supervision/Assistance  ?Patient can return home with the following Assistance with cooking/housework;Assist for transportation;A little help with walking and/or transfers;Help with stairs or ramp for entrance ?  ?Equipment Recommendations ? Rolling walker (2 wheels)  ?  ?Recommendations for Other Services   ? ? ?  ?Precautions / Restrictions Precautions ?Precautions: Anterior Hip;Other (comment) ?Precaution Comments: large hiatal hernia ?Restrictions ?Weight Bearing Restrictions: No  ?  ? ?Mobility ? Bed Mobility ?  ?  ?  ?  ?  ?  ?  ?General bed mobility comments: up in recliner ?  ? ?Transfers ?Overall transfer level: Needs assistance ?Equipment used: Rolling walker (2 wheels) ?Transfers: Sit to/from Stand ?Sit to Stand: Modified independent (Device/Increase time) ?  ?  ?  ?  ?  ?General transfer comment: good hand placement ?  ? ?Ambulation/Gait ?Ambulation/Gait assistance: Modified independent (Device/Increase time) ?Gait Distance (Feet): 140 Feet ?Assistive device: Rolling  walker (2 wheels) ?Gait Pattern/deviations: Step-through pattern, Decreased stride length ?Gait velocity: decreased ?  ?  ?General Gait Details: difficulty advancing LLE so steps first with RLE (this was her baseline), no loss of balance ? ? ?Stairs ?Stairs: Yes ?Stairs assistance: Min guard ?Stair Management: One rail Left, Sideways ?Number of Stairs: 3 ?General stair comments: steady, good sequencing, increased time 2* pain ? ? ?Wheelchair Mobility ?  ? ?Modified Rankin (Stroke Patients Only) ?  ? ? ?  ?Balance Overall balance assessment: Modified Independent ?  ?  ?  ?  ?  ?  ?  ?  ?  ?  ?  ?  ?  ?  ?  ?  ?  ?  ?  ? ?  ?Cognition Arousal/Alertness: Awake/alert ?Behavior During Therapy: [facility] for tasks assessed/performed ?Overall Cognitive Status: Within Functional Limits for tasks assessed ?  ?  ?  ?  ?  ?  ?  ?  ?  ?  ?  ?  ?  ?  ?  ?  ?  ?  ?  ? ?  ?Exercises Total Joint Exercises ?Ankle Circles/Pumps: AROM, Strengthening, Both, 10 reps, Supine ?Quad Sets: AROM, Strengthening, Both, Supine, 5 reps ?Short Arc Quad: Left, 5 reps, Supine ?Heel Slides: AROM, Strengthening, Left, Supine, 5 reps ?Hip ABduction/ADduction: AAROM, Strengthening, Left, Supine, 5 reps ?Long Arc Quad: AROM, Strengthening, Left, 10 reps, Seated ? ?  ?General Comments   ?  ?  ? ?Pertinent Vitals/Pain Pain Assessment ?Pain Score: 4  ?Pain Location: Left hip ?Pain Descriptors / Indicators: Discomfort ?Pain Intervention(s): Limited activity within patient's tolerance, Monitored during session, Premedicated before session, Repositioned  ? ? ?Home Living   ?  ?  ?  ?  ?  ?  ?  ?  ?  ?   ?  ?  Prior Function    ?  ?  ?   ? ?PT Goals (current goals can now be found in the care plan section) Acute Rehab PT Goals ?Patient Stated Goal: To return home when safe, travel ?PT Goal Formulation: With patient ?Time For Goal Achievement: 03/03/22 ?Potential to Achieve Goals: Good ?Progress towards PT goals: Goals met/education completed, patient discharged from  PT ? ?  ?Frequency ? ? ? 7X/week ? ? ? ?  ?PT Plan Current plan remains appropriate  ? ? ?Co-evaluation   ?  ?  ?  ?  ? ?  ?AM-PAC PT "6 Clicks" Mobility   ?Outcome Measure ? Help needed turning from your back to your side while in a flat bed without using bedrails?: A Little ?Help needed moving from lying on your back to sitting on the side of a flat bed without using bedrails?: A Little ?Help needed moving to and from a bed to a chair (including a wheelchair)?: None ?Help needed standing up from a chair using your arms (e.g., wheelchair or bedside chair)?: None ?Help needed to walk in hospital room?: None ?Help needed climbing 3-5 steps with a railing? : A Little ?6 Click Score: 21 ? ?  ?End of Session Equipment Utilized During Treatment: Gait belt ?Activity Tolerance: Patient tolerated treatment well ?Patient left: in chair;with chair alarm set;with call bell/phone within reach ?Nurse Communication: Mobility status ?PT Visit Diagnosis: Muscle weakness (generalized) (M62.81);Other abnormalities of gait and mobility (R26.89) ?  ? ? ?Time: 1010-1051 ?PT Time Calculation (min) (ACUTE ONLY): 41 min ? ?Charges:  $Gait Training: 8-22 mins ?$Therapeutic Exercise: 8-22 mins ?$Therapeutic Activity: 8-22 mins          ?          ? ?Philomena Doheny PT 02/25/2022  ?Acute Rehabilitation Services ?Pager [phone] ?Office [phone] ? ? ?

## 2022-02-25 NOTE — Plan of Care (Signed)
?  Problem: Pain Managment: ?Goal: General experience of comfort will improve ?Outcome: Progressing ?  ?Problem: Activity: ?Goal: Risk for activity intolerance will decrease ?Outcome: Progressing ?  ?Problem: Education: ?Goal: Knowledge of the prescribed therapeutic regimen will improve ?Outcome: Progressing ?  ?

## 2022-02-25 NOTE — Progress Notes (Signed)
? ?  Subjective: ?2 Days Post-Op Procedure(s) (LRB): ?TOTAL HIP ARTHROPLASTY ANTERIOR APPROACH (Left) ?Patient reports pain as mild.   ?Patient seen in rounds for Dr. Alvan Dame. ?Patient is resting in the recliner on exam this morning. She is doing well. Ambulated 150 ft with PT yesterday.  ?We will start therapy today.  ? ?Objective: ?Vital signs in last 24 hours: ?Temp:  [98 ?F (36.7 ?C)-98.7 ?F (37.1 ?C)] 98 ?F (36.7 ?C) (05/04 0518) ?Pulse Rate:  [65-69] 69 (05/04 0518) ?Resp:  [17-20] 17 (05/04 0518) ?BP: (105-114)/(52-58) 105/52 (05/04 0518) ?SpO2:  [94 %-96 %] 96 % (05/04 0518) ? ?Intake/Output from previous day: ? ?Intake/Output Summary (Last 24 hours) at 02/25/2022 0745 ?Last data filed at 02/25/2022 0600 ?Gross per 24 hour  ?Intake 480 ml  ?Output 200 ml  ?Net 280 ml  ?  ? ?Intake/Output this shift: ?No intake/output data recorded. ? ?Labs: ?Recent Labs  ?  02/24/22 ?0320 02/25/22 ?0328  ?HGB 10.8* 12.0  ? ?Recent Labs  ?  02/24/22 ?0320 02/25/22 ?0328  ?WBC 12.3* 19.3*  ?RBC 3.42* 3.81*  ?HCT 32.0* 35.2*  ?PLT 237 283  ? ?Recent Labs  ?  02/24/22 ?0320  ?NA 144  ?K 3.0*  ?CL 119*  ?CO2 20*  ?BUN 11  ?CREATININE 0.69  ?GLUCOSE 149*  ?CALCIUM 6.7*  ? ?No results for input(s): LABPT, INR in the last 72 hours. ? ?Exam: ?General - Patient is Alert and Oriented ?Extremity - Neurologically intact ?Sensation intact distally ?Intact pulses distally ?Dorsiflexion/Plantar flexion intact ?Dressing - dressing C/D/I ?Motor Function - intact, moving foot and toes well on exam.  ? ?Past Medical History:  ?Diagnosis Date  ? Anal fissure   ? Arthritis   ? Bursitis   ? Constipation   ? COPD (chronic obstructive pulmonary disease) ([place])   ? Diverticulitis of colon 02/2015  ? Ectopic pregnancy   ? Emphysema of lung ([place])   ? Frozen shoulder   ? Heart murmur   ? Hernia, abdominal   ? Hyperlipidemia   ? Hyperthyroidism   ? Infertility, female   ? Irregular heartbeat   ? Joint pain   ? Obesity   ? Osteoarthritis   ? Pneumonia   ?  Pre-diabetes   ? Sleep apnea   ? Swallowing difficulty   ? Thyroid disease   ? hyperthyroid  ? ? ?Assessment/Plan: ?2 Days Post-Op Procedure(s) (LRB): ?TOTAL HIP ARTHROPLASTY ANTERIOR APPROACH (Left) ?Principal Problem: ?  S/P total left hip arthroplasty ? ?Estimated body mass index is 39.98 kg/m? as calculated from the following: ?  Height as of this encounter: 4' 11.75" (1.518 m). ?  Weight as of this encounter: 92.1 kg. ?Advance diet ?Up with therapy ?D/C IV fluids ? ?DVT Prophylaxis - Aspirin ?Weight bearing as tolerated. ? ?Plan is to go Home after hospital stay. HEP. Plan for discharge today after meeting goals with therapy. Follow up in the office in 2 weeks.  ? ?Griffith Citron, PA-C ?Orthopedic Surgery ?[phone] ?02/25/2022, 7:45 AM  ?

## 2022-02-26 ENCOUNTER — Ambulatory Visit: Payer: BC Managed Care – PPO | Admitting: Internal Medicine

## 2022-02-27 LAB — TYPE AND SCREEN
ABO/RH(D): O POS
Antibody Screen: POSITIVE
Unit division: 0
Unit division: 0

## 2022-02-27 LAB — BPAM RBC
Blood Product Expiration Date: 202305172359
Blood Product Expiration Date: 202305182359
Unit Type and Rh: 5100
Unit Type and Rh: 5100

## 2022-03-01 NOTE — Discharge Summary (Signed)
Physician Discharge Summary  ? ?Patient ID: ?Alison Best ?MRN: [id number] ?DOB/AGE: Mar 20, 1956 66 y.o. ? ?Admit date: 02/23/2022 ?Discharge date: 02/25/2022 ? ?Primary Diagnosis:  Left  hip osteoarthritis.  ? ?Admission Diagnoses:  ?Past Medical History:  ?Diagnosis Date  ? Anal fissure   ? Arthritis   ? Bursitis   ? Constipation   ? COPD (chronic obstructive pulmonary disease) ([place])   ? Diverticulitis of colon 02/2015  ? Ectopic pregnancy   ? Emphysema of lung ([place])   ? Frozen shoulder   ? Heart murmur   ? Hernia, abdominal   ? Hyperlipidemia   ? Hyperthyroidism   ? Infertility, female   ? Irregular heartbeat   ? Joint pain   ? Obesity   ? Osteoarthritis   ? Pneumonia   ? Pre-diabetes   ? Sleep apnea   ? Swallowing difficulty   ? Thyroid disease   ? hyperthyroid  ? ?Discharge Diagnoses:   ?Principal Problem: ?  S/P total left hip arthroplasty ? ?Estimated body mass index is 39.98 kg/m? as calculated from the following: ?  Height as of this encounter: 4' 11.75" (1.518 m). ?  Weight as of this encounter: 92.1 kg. ? ?Procedure:  ?Procedure(s) (LRB): ?TOTAL HIP ARTHROPLASTY ANTERIOR APPROACH (Left)  ? ?Consults: None ? ?HPI: Alison Best is a 66 y.o. female who had  ? presented to office for evaluation of left hip pain.  Radiographs revealed  ? progressive degenerative changes with bone-on-bone  ? articulation of the  hip joint, including subchondral cystic changes and osteophytes.  The patient had painful limited range of  ? motion significantly affecting their overall quality of life and function.  The patient was failing to   ? respond to conservative measures including medications and/or injections and activity modification and at this point was ready  ? to proceed with more definitive measures.  Consent was obtained for  ? benefit of pain relief.  Specific risks of infection, DVT, component  ? failure, dislocation, neurovascular injury, and need for revision surgery were reviewed in the  office. ? ?Laboratory Data: ?Admission on 02/23/2022, Discharged on 02/25/2022  ?Component Date Value Ref Range Status  ? Glucose-Capillary 02/23/2022 123 (H)  70 - 99 mg/dL Final  ? Glucose reference range applies only to samples taken after fasting for at least 8 hours.  ? ABO/RH(D) 02/23/2022 O POS   Final  ? Antibody Screen 02/23/2022 POS   Final  ? Sample Expiration 02/23/2022 02/26/2022,2359   Final  ? Unit Number 02/23/2022 [id number]   Final  ? Blood Component Type 02/23/2022 RBC LR PHER2   Final  ? Unit division 02/23/2022 00   Final  ? Status of Unit 02/23/2022 REL FROM [facility]   Final  ? Transfusion Status 02/23/2022 OK TO TRANSFUSE   Final  ? Crossmatch Result 02/23/2022 COMPATIBLE   Final  ? Unit Number 02/23/2022 [id number]   Final  ? Blood Component Type 02/23/2022 RBC LR PHER2   Final  ? Unit division 02/23/2022 00   Final  ? Status of Unit 02/23/2022 REL FROM [facility]   Final  ? Transfusion Status 02/23/2022 OK TO TRANSFUSE   Final  ? Crossmatch Result 02/23/2022 COMPATIBLE   Final  ? Blood Product Unit Number 02/23/2022 [id number]   Final  ? PRODUCT CODE 02/23/2022 [id number]   Final  ? Unit Type and Rh 02/23/2022 5100   Final  ? Blood Product Expiration Date 02/23/2022 [id number]   Final  ? Blood Product Unit  Number 02/23/2022 [id number]   Final  ? PRODUCT CODE 02/23/2022 [id number]   Final  ? Unit Type and Rh 02/23/2022 5100   Final  ? Blood Product Expiration Date 02/23/2022 [id number]   Final  ? Glucose-Capillary 02/23/2022 100 (H)  70 - 99 mg/dL Final  ? Glucose reference range applies only to samples taken after fasting for at least 8 hours.  ? WBC 02/24/2022 12.3 (H)  4.0 - 10.5 K/uL Final  ? RBC 02/24/2022 3.42 (L)  3.87 - 5.11 MIL/uL Final  ? Hemoglobin 02/24/2022 10.8 (L)  12.0 - 15.0 g/dL Final  ? HCT 02/24/2022 32.0 (L)  36.0 - 46.0 % Final  ? MCV 02/24/2022 93.6  80.0 - 100.0 fL Final  ? MCH 02/24/2022 31.6  26.0 - 34.0 pg Final  ? MCHC 02/24/2022 33.8  30.0 - 36.0 g/dL Final  ?  RDW 02/24/2022 14.2  11.5 - 15.5 % Final  ? Platelets 02/24/2022 237  150 - 400 K/uL Final  ? nRBC 02/24/2022 0.0  0.0 - 0.2 % Final  ? Performed at [facility], [place] [address] [place], [place] [address]  ? Sodium 02/24/2022 144  135 - 145 mmol/L Final  ? Potassium 02/24/2022 3.0 (L)  3.5 - 5.1 mmol/L Final  ? Chloride 02/24/2022 119 (H)  98 - 111 mmol/L Final  ? CO2 02/24/2022 20 (L)  22 - 32 mmol/L Final  ? Glucose, Bld 02/24/2022 149 (H)  70 - 99 mg/dL Final  ? Glucose reference range applies only to samples taken after fasting for at least 8 hours.  ? BUN 02/24/2022 11  8 - 23 mg/dL Final  ? Creatinine, Ser 02/24/2022 0.69  0.44 - 1.00 mg/dL Final  ? Calcium 02/24/2022 6.7 (L)  8.9 - 10.3 mg/dL Final  ? GFR, Estimated 02/24/2022 >60  >60 mL/min Final  ? Comment: (NOTE) ?Calculated using the CKD-EPI Creatinine Equation (2021) ?  ? Anion gap 02/24/2022 5  5 - 15 Final  ? Performed at [facility], [place] [address], [place], [place] [address]  ? WBC 02/25/2022 19.3 (H)  4.0 - 10.5 K/uL Final  ? RBC 02/25/2022 3.81 (L)  3.87 - 5.11 MIL/uL Final  ? Hemoglobin 02/25/2022 12.0  12.0 - 15.0 g/dL Final  ? HCT 02/25/2022 35.2 (L)  36.0 - 46.0 % Final  ? MCV 02/25/2022 92.4  80.0 - 100.0 fL Final  ? MCH 02/25/2022 31.5  26.0 - 34.0 pg Final  ? MCHC 02/25/2022 34.1  30.0 - 36.0 g/dL Final  ? RDW 02/25/2022 14.5  11.5 - 15.5 % Final  ? Platelets 02/25/2022 283  150 - 400 K/uL Final  ? nRBC 02/25/2022 0.0  0.0 - 0.2 % Final  ? Performed at [facility], [place] [address] [place], [place] [address]  ? Sodium 02/25/2022 143  135 - 145 mmol/L Final  ? Potassium 02/25/2022 4.2  3.5 - 5.1 mmol/L Final  ? Comment: DELTA CHECK NOTED ?NO VISIBLE HEMOLYSIS ?  ? Chloride 02/25/2022 110  98 - 111 mmol/L Final  ? CO2 02/25/2022 27  22 - 32 mmol/L Final  ? Glucose, Bld 02/25/2022 131 (H)  70 - 99 mg/dL Final  ? Glucose reference range applies only to samples taken after fasting for at  least 8 hours.  ? BUN 02/25/2022 19  8 - 23 mg/dL Final  ? Creatinine, Ser 02/25/2022 0.84  0.44 - 1.00 mg/dL Final  ? Calcium 02/25/2022 9.2  8.9 -  10.3 mg/dL Final  ? DELTA CHECK NOTED  ? GFR, Estimated 02/25/2022 >60  >60 mL/min Final  ? Comment: (NOTE) ?Calculated using the CKD-EPI Creatinine Equation (2021) ?  ? Anion gap 02/25/2022 6  5 - 15 Final  ? Performed at [facility], [place] [address] [place], [place] [address]  ?Hospital Outpatient Visit on 02/11/2022  ?Component Date Value Ref Range Status  ? MRSA, PCR 02/11/2022 POSITIVE (A)  NEGATIVE Final  ? Comment: RESULT CALLED TO, READ BACK BY AND VERIFIED WITH: ?HESTER,T. RN AT 9678 02/11/22 MULLINS,T ?  ? Staphylococcus aureus 02/11/2022 POSITIVE (A)  NEGATIVE Final  ? Comment: RESULT CALLED TO, READ BACK BY AND VERIFIED WITH: ?HESTER,T. RN AT 9381 02/11/22 MULLINS,T ?(NOTE) ?The Xpert SA Assay (FDA approved for NASAL specimens in patients 84 ?years of age and older), is one component of a comprehensive ?surveillance program. It is not intended to diagnose infection nor to ?guide or monitor treatment. ?Performed at [facility], [place] [address] ?[place], [place] [address] ?  ? WBC 02/11/2022 10.6 (H)  4.0 - 10.5 K/uL Final  ? RBC 02/11/2022 5.08  3.87 - 5.11 MIL/uL Final  ? Hemoglobin 02/11/2022 15.5 (H)  12.0 - 15.0 g/dL Final  ? HCT 02/11/2022 47.1 (H)  36.0 - 46.0 % Final  ? MCV 02/11/2022 92.7  80.0 - 100.0 fL Final  ? MCH 02/11/2022 30.5  26.0 - 34.0 pg Final  ? MCHC 02/11/2022 32.9  30.0 - 36.0 g/dL Final  ? RDW 02/11/2022 14.2  11.5 - 15.5 % Final  ? Platelets 02/11/2022 324  150 - 400 K/uL Final  ? nRBC 02/11/2022 0.0  0.0 - 0.2 % Final  ? Performed at [facility], [place] [address] [place], [place] [address]  ? Sodium 02/11/2022 141  135 - 145 mmol/L Final  ? Potassium 02/11/2022 4.4  3.5 - 5.1 mmol/L Final  ? Chloride 02/11/2022 108  98 - 111 mmol/L Final  ? CO2 02/11/2022 25  22 - 32 mmol/L  Final  ? Glucose, Bld 02/11/2022 117 (H)  70 - 99 mg/dL Final  ? Glucose reference range applies only to samples taken after fasting for at least 8 hours.  ? BUN 02/11/2022 18  8 - 23 mg/dL Final  ? Creatin

## 2022-03-04 ENCOUNTER — Ambulatory Visit: Payer: Self-pay | Admitting: Obstetrics & Gynecology

## 2022-03-08 ENCOUNTER — Other Ambulatory Visit: Payer: Self-pay | Admitting: Family Medicine

## 2022-03-08 DIAGNOSIS — Z1231 Encounter for screening mammogram for malignant neoplasm of breast: Secondary | ICD-10-CM

## 2022-03-12 ENCOUNTER — Encounter: Payer: Self-pay | Admitting: Physician Assistant

## 2022-03-25 DIAGNOSIS — Z1231 Encounter for screening mammogram for malignant neoplasm of breast: Secondary | ICD-10-CM

## 2022-03-26 ENCOUNTER — Ambulatory Visit (INDEPENDENT_AMBULATORY_CARE_PROVIDER_SITE_OTHER): Payer: BC Managed Care – PPO | Admitting: Family Medicine

## 2022-03-26 ENCOUNTER — Encounter: Payer: Self-pay | Admitting: Family Medicine

## 2022-03-26 VITALS — BP 130/70 | HR 96 | Temp 98.1°F | Ht 60.0 in | Wt 199.2 lb

## 2022-03-26 DIAGNOSIS — Z0000 Encounter for general adult medical examination without abnormal findings: Secondary | ICD-10-CM | POA: Diagnosis not present

## 2022-03-26 DIAGNOSIS — E559 Vitamin D deficiency, unspecified: Secondary | ICD-10-CM

## 2022-03-26 DIAGNOSIS — E042 Nontoxic multinodular goiter: Secondary | ICD-10-CM

## 2022-03-26 LAB — HEPATIC FUNCTION PANEL
ALT: 24 U/L (ref 0–35)
AST: 20 U/L (ref 0–37)
Albumin: 4.2 g/dL (ref 3.5–5.2)
Alkaline Phosphatase: 55 U/L (ref 39–117)
Bilirubin, Direct: 0.1 mg/dL (ref 0.0–0.3)
Total Bilirubin: 0.5 mg/dL (ref 0.2–1.2)
Total Protein: 7 g/dL (ref 6.0–8.3)

## 2022-03-26 LAB — CBC WITH DIFFERENTIAL/PLATELET
Basophils Absolute: 0.1 10*3/uL (ref 0.0–0.1)
Basophils Relative: 0.7 % (ref 0.0–3.0)
Eosinophils Absolute: 0.4 10*3/uL (ref 0.0–0.7)
Eosinophils Relative: 5.7 % — ABNORMAL HIGH (ref 0.0–5.0)
HCT: 41.3 % (ref 36.0–46.0)
Hemoglobin: 13.9 g/dL (ref 12.0–15.0)
Lymphocytes Relative: 27.3 % (ref 12.0–46.0)
Lymphs Abs: 1.9 10*3/uL (ref 0.7–4.0)
MCHC: 33.6 g/dL (ref 30.0–36.0)
MCV: 92.4 fl (ref 78.0–100.0)
Monocytes Absolute: 0.5 10*3/uL (ref 0.1–1.0)
Monocytes Relative: 7 % (ref 3.0–12.0)
Neutro Abs: 4.2 10*3/uL (ref 1.4–7.7)
Neutrophils Relative %: 59.3 % (ref 43.0–77.0)
Platelets: 317 10*3/uL (ref 150.0–400.0)
RBC: 4.47 Mil/uL (ref 3.87–5.11)
RDW: 15.3 % (ref 11.5–15.5)
WBC: 7.1 10*3/uL (ref 4.0–10.5)

## 2022-03-26 LAB — LIPID PANEL
Cholesterol: 80 mg/dL (ref 0–200)
HDL: 32.4 mg/dL — ABNORMAL LOW (ref >39.00)
LDL Cholesterol: 28 mg/dL (ref 0–99)
NonHDL: 47.21
Total CHOL/HDL Ratio: 2
Triglycerides: 95 mg/dL (ref 0.0–149.0)
VLDL: 19 mg/dL (ref 0.0–40.0)

## 2022-03-26 LAB — HEMOGLOBIN A1C: Hgb A1c MFr Bld: 5.5 % (ref 4.6–6.5)

## 2022-03-26 LAB — BASIC METABOLIC PANEL
BUN: 22 mg/dL (ref 6–23)
CO2: 29 mEq/L (ref 19–32)
Calcium: 9.6 mg/dL (ref 8.4–10.5)
Chloride: 106 mEq/L (ref 96–112)
Creatinine, Ser: 0.86 mg/dL (ref 0.40–1.20)
GFR: 71.3 mL/min (ref >60.00)
Glucose, Bld: 114 mg/dL — ABNORMAL HIGH (ref 70–99)
Potassium: 3.9 mEq/L (ref 3.5–5.1)
Sodium: 141 mEq/L (ref 135–145)

## 2022-03-26 LAB — T4, FREE: Free T4: 1.03 ng/dL (ref 0.60–1.60)

## 2022-03-26 LAB — VITAMIN D 25 HYDROXY (VIT D DEFICIENCY, FRACTURES): VITD: 44.21 ng/mL (ref 30.00–100.00)

## 2022-03-26 LAB — TSH: TSH: 0.62 u[IU]/mL (ref 0.35–5.50)

## 2022-03-26 LAB — T3, FREE: T3, Free: 3.2 pg/mL (ref 2.3–4.2)

## 2022-03-26 MED ORDER — MUPIROCIN 2 % EX OINT
TOPICAL_OINTMENT | CUTANEOUS | 5 refills | Status: DC
Start: 2022-03-26 — End: 2024-05-04

## 2022-03-26 MED ORDER — KETOCONAZOLE 2 % EX CREA
1.0000 | TOPICAL_CREAM | Freq: Two times a day (BID) | CUTANEOUS | 5 refills | Status: DC | PRN
Start: 2022-03-26 — End: 2024-05-24

## 2022-03-26 MED ORDER — BETAMETHASONE DIPROPIONATE 0.05 % EX CREA
1.0000 | TOPICAL_CREAM | Freq: Two times a day (BID) | CUTANEOUS | 5 refills | Status: DC | PRN
Start: 2022-03-26 — End: 2024-05-04

## 2022-03-26 NOTE — Progress Notes (Signed)
Subjective:    Patient ID: Alison Best, female    DOB: 1956/03/04, 66 y.o.   MRN: [id number]  HPI Here for a well exam. She has a number of issues going on. She had a left total hip arthroplasty on 02-23-22, and this went very well. She is progressing with PT and his little pain. She still has a large ventral incisional hernia, and she is working with [facility] on this. We anticipate she will have a surgical repair sometime later this year. She is past due for a colonoscopy, but she is scheduled to meet with GI next week. She gets yearly mammograms, and she had a DEXA lat year per Dr. Dellis Filbert, her GYN. She has eczema on the ears and she asks for a refill of Betmethasone cream. She also complains of irritation inside the tip of both nostrils for the past 6 months. Lastly she has had an itchy rash on the sole of the right foot for several months.    Review of Systems  Constitutional: Negative.   HENT: Negative.    Eyes: Negative.   Respiratory: Negative.    Cardiovascular: Negative.   Gastrointestinal: Negative.   Genitourinary:  Negative for decreased urine volume, difficulty urinating, dyspareunia, dysuria, enuresis, flank pain, frequency, hematuria, pelvic pain and urgency.  Musculoskeletal: Negative.   Skin:  Positive for rash.  Neurological: Negative.  Negative for headaches.  Psychiatric/Behavioral: Negative.        Objective:   Physical Exam Constitutional:      General: She is not in acute distress.    Appearance: She is well-developed. She is obese. She is not diaphoretic.     Comments: Walks with a rolling walker  HENT:     Head: Normocephalic and atraumatic.     Right Ear: External ear normal.     Left Ear: External ear normal.     Nose: Nose normal.     Mouth/Throat:     Pharynx: No oropharyngeal exudate.  Eyes:     General: No scleral icterus.       Right eye: No discharge.        Left eye: No discharge.     Conjunctiva/sclera: Conjunctivae normal.      Pupils: Pupils are equal, round, and reactive to light.  Neck:     Thyroid: No thyromegaly.     Vascular: No JVD.  Cardiovascular:     Rate and Rhythm: Normal rate and regular rhythm.     Heart sounds: Normal heart sounds. No murmur heard.   No friction rub. No gallop.  Pulmonary:     Effort: Pulmonary effort is normal. No respiratory distress.     Breath sounds: Normal breath sounds. No stridor. No wheezing or rales.  Chest:     Chest wall: No tenderness.  Abdominal:     General: Bowel sounds are normal. There is no abdominal bruit.     Palpations: Abdomen is not rigid. There is no mass.     Tenderness: There is no abdominal tenderness. There is no guarding.     Hernia: No hernia is present.     Comments: There is a very large hernia in the left lower quadrant   Genitourinary:    Vagina: Normal. No vaginal discharge, erythema, tenderness or bleeding.     Cervix: No cervical motion tenderness, discharge or friability.     Adnexa:        Right: No mass, tenderness or fullness.  Left: No mass, tenderness or fullness.       Rectum: Normal.  Musculoskeletal:        General: No tenderness. Normal range of motion.     Cervical back: Normal range of motion and neck supple.  Lymphadenopathy:     Cervical: No cervical adenopathy.  Skin:    General: Skin is warm and dry.     Coloration: Skin is not pale.     Findings: No erythema.     Comments: There is a scaly rash on the sole of the right foot   Neurological:     Mental Status: She is alert.     Cranial Nerves: No cranial nerve deficit.     Motor: No abnormal muscle tone.     Coordination: Coordination normal.     Deep Tendon Reflexes: Reflexes are normal and symmetric.  Psychiatric:        Behavior: Behavior normal.        Thought Content: Thought content normal.        Judgment: Judgment normal.          Assessment & Plan:  Well exam. We dicussed diet and exercise. Get fasting labs. We refilled Betamethasone  for the eczema. She will use Mupiricin ointment in the nostrils for what is likely a MRSA infection. She will treat the fungal infection on the right foot with Ketoconazole cream.  Alysia Penna, MD

## 2022-04-01 ENCOUNTER — Ambulatory Visit
Admission: RE | Admit: 2022-04-01 | Discharge: 2022-04-01 | Disposition: A | Discharge status: Home / Self Care | Payer: BC Managed Care – PPO | Source: Ambulatory Visit | Attending: Family Medicine | Admitting: Family Medicine

## 2022-04-01 DIAGNOSIS — Z1231 Encounter for screening mammogram for malignant neoplasm of breast: Secondary | ICD-10-CM

## 2022-04-01 NOTE — Progress Notes (Unsigned)
04/01/2022 Alison Best [id number] Jul 11, 1956  Referring provider: Laurey Morale, MD Primary GI doctor: Dr. Silverio Decamp  ASSESSMENT AND PLAN:   There are no diagnoses linked to this encounter.   Patient Care Team: Laurey Morale, MD as PCP - General Burnell Blanks, MD as PCP - Cardiology (Cardiology)  HISTORY OF PRESENT ILLNESS: 66 y.o. female with a past medical history of hyperlipidemia, prediabetes, obesity, vitamin D deficiency, psoriasis, OSA, Raynaud's syndrome, history of diverticulitis status post perforation of sigmoid colon status post colostomy and reversal of colostomy December 2016, GERD and others listed below presents for evaluation of alternating diarrhea/constipation to discuss colonoscopy.   07/11/2017 office visit with Dr. Rush Landmark for left-sided abdominal pain and ventral hernia and alternating constipation and diarrhea at that time.  Advised to add fiber, following CCS for conservative management of ventral hernia 2011 colonoscopy left-sided diverticulosis no polyps recall 10 years. 07/29/2021 CT abdomen pelvis without contrast Large left lower quadrant wall hernia without obstruction, hepatic steatosis, gallstones 04/23/2021  CT AB  left lower quadrant abdominal pain severe hepatic steatosis, cholelithiasis without inflammation, left lower quadrant hernia containing fat and loop of colon.    Current Medications:   Current Outpatient Medications (Endocrine & Metabolic):    metFORMIN (GLUCOPHAGE) 500 MG tablet, TAKE 1 TABLET(500 MG) BY MOUTH TWICE DAILY  Current Outpatient Medications (Cardiovascular):    acetaZOLAMIDE ER (DIAMOX) 500 MG capsule, Take 1 capsule (500 mg total) by mouth 2 (two) times daily. When travelling at high altitudes   atorvastatin (LIPITOR) 20 MG tablet, TAKE 1 TABLET(20 MG) BY MOUTH DAILY   diltiazem (CARDIZEM CD) 120 MG 24 hr capsule, Take 1 capsule (120 mg total) by mouth daily.   fenofibrate micronized  (LOFIBRA) 134 MG capsule, TAKE ONE CAPSULE BY MOUTH EVERY DAY   furosemide (LASIX) 20 MG tablet, Take 20 mg by mouth daily as needed for fluid or edema.   Current Outpatient Medications (Analgesics):    celecoxib (CELEBREX) 200 MG capsule, Take 1 capsule (200 mg total) by mouth 2 (two) times daily.   Current Outpatient Medications (Other):    betamethasone dipropionate 0.05 % cream, Apply 1 application. topically 2 (two) times daily as needed (eczema).   Coenzyme Q10 (CO Q 10 PO), Take 200 mg by mouth daily.   COLLAGEN PO, Take 15 mLs by mouth daily. Liquid   docusate sodium (COLACE) 100 MG capsule, Take 1 capsule (100 mg total) by mouth 2 (two) times daily.   ketoconazole (NIZORAL) 2 % cream, Apply 1 application. topically 2 (two) times daily as needed for irritation (fungal infections).   Multiple Vitamins-Minerals (MULTIVITAMIN/EXTRA VITAMIN D3 PO), Take 1 tablet by mouth daily.   mupirocin ointment (BACTROBAN) 2 %, Apply inside both nostrils twice a day   Omega-3 Fatty Acids (FISH OIL PO), Take by mouth.   OVER THE COUNTER MEDICATION, Take 1 tablet by mouth daily. Saffron   OVER THE COUNTER MEDICATION, Take 320 mg by mouth daily. phytoceramides   OVER THE COUNTER MEDICATION, Take 2 capsules by mouth daily. Memory and Brain   OVER THE COUNTER MEDICATION, Take 1 capsule by mouth daily. Cruciferous extract   OVER THE COUNTER MEDICATION, Take 1 capsule by mouth daily. fruit full antioxidant   polyethylene glycol (MIRALAX / GLYCOLAX) 17 g packet, Take 17 g by mouth daily as needed for mild constipation.   Turmeric 400 MG CAPS, Take 400 mg by mouth daily.   UNABLE TO FIND, Med Name: vegetable blend  Medical  History:  Past Medical History:  Diagnosis Date   Anal fissure    Arthritis    Bursitis    Constipation    COPD (chronic obstructive pulmonary disease) ([place])    Diverticulitis of colon 02/2015   Ectopic pregnancy    Emphysema of lung (HCC)    Frozen shoulder    Heart murmur     Hernia, abdominal    Hyperlipidemia    Hyperthyroidism    Infertility, female    Irregular heartbeat    Joint pain    Obesity    Osteoarthritis    Pneumonia    Pre-diabetes    Sleep apnea    Swallowing difficulty    Thyroid disease    hyperthyroid   Allergies:  Allergies  Allergen Reactions   Levofloxacin In D5w Other (See Comments)    Achilles tendon weakness   Mango Flavor Swelling    fruit   Bupropion Nausea And Vomiting     Surgical History:  She  has a past surgical history that includes Colonoscopy (09/10/2010); Cesarean section; Appendectomy; Tonsillectomy (1980); laparotomy (N/A, 02/20/2015); Salpingoophorectomy (Left, 02/20/2015); Colostomy (N/A, 02/20/2015); Colostomy revision (02/20/2015); Cystoscopy (N/A, 02/20/2015); [place] loop colostomy closure (N/A, 09/26/2015); Dilation and curettage of uterus; Ectopic pregnancy surgery; and Total hip arthroplasty (Left, 02/23/2022). Family History:  Her family history includes Allergies in her brother; Breast cancer in her maternal grandmother and mother; CAD in her father; COPD in an other family member; Cancer in an other family member; Colon polyps in her father; Diabetes in her brother; Diverticulitis in her father; Heart attack in her maternal grandfather and paternal grandfather; Heart disease in her father and another family member; High blood pressure in her father and mother; Kidney disease in her mother; Lung cancer in her father; Obesity in her mother; Skin cancer in her father; Sleep apnea in her father and mother; Stroke in her father and another family member. Social History:   reports that she has been smoking cigarettes. She has a 10.00 pack-year smoking history. She has never used smokeless tobacco. She reports that she does not currently use alcohol. She reports that she does not use drugs.  REVIEW OF SYSTEMS  : All other systems reviewed and negative except where noted in the History of Present Illness.   PHYSICAL  EXAM: LMP 02/23/2012  General:   Pleasant, well developed female in no acute distress Head:   Normocephalic and atraumatic. Eyes:  {sclerae:26738},conjunctive {conjuctiva:26739}  Heart:   {HEART EXAM HEM/ONC:21750} Pulm:  Clear anteriorly; no wheezing Abdomen:   {BlankSingle:19197::"Distended","Ridged","Soft"}, {BlankSingle:19197::"Flat","Obese","Non-distended"} AB, {BlankSingle:19197::"Absent","Hyperactive, tinkling","Hypoactive","Sluggish","Active"} bowel sounds. {actendernessAB:27319} tenderness {anatomy; site abdomen:5010}. {BlankMultiple:19196::"Without guarding","With guarding","Without rebound","With rebound"}, No organomegaly appreciated. Rectal: {acrectalexam:27461} Extremities:  {With/Without:304960234} edema. Msk: Symmetrical without gross deformities. Peripheral pulses intact.  Neurologic:  Alert and  oriented x4;  No focal deficits.  Skin:   Dry and intact without significant lesions or rashes. Psychiatric:  Cooperative. Normal mood and affect.    Vladimir Crofts, PA-C 12:24 PM

## 2022-04-03 ENCOUNTER — Other Ambulatory Visit: Payer: Self-pay | Admitting: Family Medicine

## 2022-04-03 DIAGNOSIS — M1990 Unspecified osteoarthritis, unspecified site: Secondary | ICD-10-CM

## 2022-04-04 ENCOUNTER — Other Ambulatory Visit: Payer: Self-pay | Admitting: Family Medicine

## 2022-04-05 ENCOUNTER — Encounter: Payer: Self-pay | Admitting: Physician Assistant

## 2022-04-05 ENCOUNTER — Ambulatory Visit (INDEPENDENT_AMBULATORY_CARE_PROVIDER_SITE_OTHER): Payer: BC Managed Care – PPO | Admitting: Physician Assistant

## 2022-04-05 VITALS — BP 118/70 | HR 72 | Ht 60.0 in | Wt 200.8 lb

## 2022-04-05 DIAGNOSIS — Z933 Colostomy status: Secondary | ICD-10-CM | POA: Diagnosis not present

## 2022-04-05 DIAGNOSIS — Z1211 Encounter for screening for malignant neoplasm of colon: Secondary | ICD-10-CM

## 2022-04-05 DIAGNOSIS — K760 Fatty (change of) liver, not elsewhere classified: Secondary | ICD-10-CM

## 2022-04-05 DIAGNOSIS — R198 Other specified symptoms and signs involving the digestive system and abdomen: Secondary | ICD-10-CM

## 2022-04-05 DIAGNOSIS — K219 Gastro-esophageal reflux disease without esophagitis: Secondary | ICD-10-CM

## 2022-04-05 DIAGNOSIS — K5730 Diverticulosis of large intestine without perforation or abscess without bleeding: Secondary | ICD-10-CM | POA: Diagnosis not present

## 2022-04-05 DIAGNOSIS — K439 Ventral hernia without obstruction or gangrene: Secondary | ICD-10-CM | POA: Diagnosis not present

## 2022-04-05 MED ORDER — FUROSEMIDE 20 MG PO TABS: 20.0000 mg | ORAL_TABLET | Freq: Every day | ORAL | 11 refills | Status: DC | PRN

## 2022-04-05 MED ORDER — FENOFIBRATE 134 MG PO CAPS: 134.0000 mg | ORAL_CAPSULE | Freq: Every day | ORAL | 1 refills | Status: DC

## 2022-04-05 MED ORDER — NA SULFATE-K SULFATE-MG SULF 17.5-3.13-1.6 GM/177ML PO SOLN: 1.0000 | Freq: Once | ORAL | 0 refills | Status: AC

## 2022-04-05 NOTE — Patient Instructions (Addendum)
If you are age 65 or younger, your body mass index should be between 19-25. Your Body mass index is 39.22 kg/m. If this is out of the aformentioned range listed, please consider follow up with your Primary Care Provider.  ________________________________________________________  The [place] [facility] providers would like to encourage you to use [facility] to communicate with providers for non-urgent requests or questions.  Due to long hold times on the telephone, sending your provider a message by [facility] may be a faster and more efficient way to get a response.  Please allow 48 business hours for a response.  Please remember that this is for non-urgent requests.  _______________________________________________________  Alison Best have been scheduled for a colonoscopy. Please follow written instructions given to you at your visit today.  Please pick up your prep supplies at the pharmacy within the next 1-3 days. If you use inhalers (even only as needed), please bring them with you on the day of your procedure.  Follow up as needed for now.  Thank you for entrusting me with your care and choosing [facility].  Vicie Mutters, PA-C  Miralax is an osmotic laxative.  It only brings more water into the stool.  This is safe to take daily.  Can take up to 17 gram of miralax twice a day.  Mix with juice or coffee.  Start 1 capful at night for 3-4 days and reassess your response in 3-4 days.  You can increase and decrease the dose based on your response.  Remember, it can take up to 3-4 days to take effect OR for the effects to wear off.   I often pair this with benefiber in the morning to help assure the stool is not too loose.   Diverticulosis Diverticulosis is a condition that develops when small pouches (diverticula) form in the wall of the large intestine (colon). The colon is where water is absorbed and stool (feces) is formed. The pouches form when the inside layer of the colon pushes through weak  spots in the outer layers of the colon. You may have a few pouches or many of them. The pouches usually do not cause problems unless they become inflamed or infected. When this happens, the condition is called diverticulitis- this is left lower quadrant pain, diarrhea, fever, chills, nausea or vomiting.  If this occurs please call the office or go to the hospital. Sometimes these patches without inflammation can also have painless bleeding associated with them, if this happens please call the office or go to the hospital. Preventing constipation and increasing fiber can help reduce diverticula and prevent complications. Even if you feel you have a high-fiber diet, suggest getting on Benefiber 2-3 times daily.

## 2022-04-05 NOTE — Telephone Encounter (Signed)
Please refill these for one year  

## 2022-04-05 NOTE — Telephone Encounter (Signed)
Please refill both for one year

## 2022-04-20 ENCOUNTER — Other Ambulatory Visit: Payer: Self-pay

## 2022-04-20 ENCOUNTER — Encounter (HOSPITAL_BASED_OUTPATIENT_CLINIC_OR_DEPARTMENT_OTHER): Payer: Self-pay | Admitting: Physical Therapy

## 2022-04-20 ENCOUNTER — Ambulatory Visit (HOSPITAL_BASED_OUTPATIENT_CLINIC_OR_DEPARTMENT_OTHER): Payer: BC Managed Care – PPO | Attending: Orthopedic Surgery | Admitting: Physical Therapy

## 2022-04-20 DIAGNOSIS — R262 Difficulty in walking, not elsewhere classified: Secondary | ICD-10-CM | POA: Insufficient documentation

## 2022-04-20 DIAGNOSIS — M25652 Stiffness of left hip, not elsewhere classified: Secondary | ICD-10-CM | POA: Insufficient documentation

## 2022-04-20 DIAGNOSIS — M25552 Pain in left hip: Secondary | ICD-10-CM | POA: Diagnosis present

## 2022-04-20 DIAGNOSIS — M6281 Muscle weakness (generalized): Secondary | ICD-10-CM | POA: Diagnosis present

## 2022-04-23 ENCOUNTER — Encounter (HOSPITAL_BASED_OUTPATIENT_CLINIC_OR_DEPARTMENT_OTHER): Payer: Self-pay | Admitting: Physical Therapy

## 2022-04-23 ENCOUNTER — Ambulatory Visit (HOSPITAL_BASED_OUTPATIENT_CLINIC_OR_DEPARTMENT_OTHER): Payer: BC Managed Care – PPO | Admitting: Physical Therapy

## 2022-04-23 DIAGNOSIS — M25652 Stiffness of left hip, not elsewhere classified: Secondary | ICD-10-CM | POA: Diagnosis not present

## 2022-04-23 DIAGNOSIS — M6281 Muscle weakness (generalized): Secondary | ICD-10-CM

## 2022-04-23 DIAGNOSIS — M25552 Pain in left hip: Secondary | ICD-10-CM

## 2022-04-23 NOTE — Therapy (Addendum)
OUTPATIENT PHYSICAL THERAPY LOWER EXTREMITY TREATMENT NOTE   Patient Name: Alison Best MRN: [id number] DOB:1956-07-14, 66 y.o., female Today's Date: 04/23/2022   PT End of Session - 04/23/22 0854     Visit Number 2    Number of Visits 19    Authorization Type BCBS    PT Start Time [phone]    PT Stop Time 0930    PT Time Calculation (min) 41 min    Activity Tolerance Patient tolerated treatment well    Behavior During Therapy [facility] for tasks assessed/performed             Past Medical History:  Diagnosis Date   Anal fissure    Arthritis    Bursitis    Constipation    COPD (chronic obstructive pulmonary disease) ([place])    Diverticulitis of colon 02/2015   Ectopic pregnancy    Emphysema of lung (HCC)    Frozen shoulder    Heart murmur    Hernia, abdominal    Hyperlipidemia    Hyperthyroidism    Infertility, female    Irregular heartbeat    Joint pain    Obesity    Osteoarthritis    Pneumonia    Pre-diabetes    Sleep apnea    Swallowing difficulty    Thyroid disease    hyperthyroid   Past Surgical History:  Procedure Laterality Date   APPENDECTOMY     CESAREAN SECTION     COLONOSCOPY  09/10/2010   per Dr. Acquanetta Sit, diverticulosis of descending colon and sigmoid, internal hemorrhoids, repeat in 10 yrs   COLOSTOMY N/A 02/20/2015   Procedure: COLOSTOMY;  Surgeon: Jackolyn Confer, MD;  Location: [facility];  Service: General;  Laterality: N/A;   COLOSTOMY REVISION  02/20/2015   Procedure: Sigmoid Colectomy;  Surgeon: Jackolyn Confer, MD;  Location: [facility];  Service: General;;   CYSTOSCOPY N/A 02/20/2015   Procedure: Consuela Mimes;  Surgeon: Kathie Rhodes, MD;  Location: [facility];  Service: Urology;  Laterality: N/A;   DILATION AND CURETTAGE OF UTERUS     x3   ECTOPIC PREGNANCY SURGERY     ILEO LOOP COLOSTOMY CLOSURE N/A 09/26/2015   Procedure: LAPAROSCOPIC LYSIS OF ADHESIONS, COLOSTOMY CLOSURE;  Surgeon: Jackolyn Confer, MD;  Location: [facility];  Service:  General;  Laterality: N/A;   LAPAROTOMY N/A 02/20/2015   Procedure: Emergency EXPLORATORY and drainiage of interabdominal abcesses;  Surgeon: Jackolyn Confer, MD;  Location: [facility];  Service: General;  Laterality: N/A;   SALPINGOOPHORECTOMY Left 02/20/2015   Procedure: SALPINGO OOPHORECTOMY;  Surgeon: Jackolyn Confer, MD;  Location: [facility];  Service: General;  Laterality: Left;   [place] Left 02/23/2022   Procedure: TOTAL HIP ARTHROPLASTY ANTERIOR APPROACH;  Surgeon: Paralee Cancel, MD;  Location: [facility];  Service: Orthopedics;  Laterality: Left;   Patient Active Problem List   Diagnosis Date Noted   Vitamin D deficiency 03/26/2022   S/P total left hip arthroplasty 02/23/2022   Adjustment disorder with depressed mood 11/18/2021   COVID-19 virus infection 07/07/2021   Psoriasis 01/15/2021   Chronic pain of left knee 01/15/2021   Ventral hernia without obstruction or gangrene 01/15/2021   Class 3 severe obesity with serious comorbidity and body mass index (BMI) of 40.0 to 44.9 in adult [facility]) 01/15/2019   Acne rosacea 05/10/2016   Multinodular goiter 02/27/2016   Subclinical thyrotoxicosis 02/26/2016   Colostomy in place [facility]) s/p colostomy reversal 09/26/15 09/26/2015   Prediabetes 07/30/2015   Perforation of  sigmoid colon ([place]) 02/20/2015   Diverticulitis of large intestine with abscess without bleeding 02/18/2015   OSA (obstructive sleep apnea) 05/09/2013   GASTROENTERITIS 08/18/2010   RAYNAUD'S SYNDROME 02/17/2009   PLANTAR FASCIITIS 02/17/2009   Asthma 06/12/2008   Hyperlipidemia 09/04/2007   DEVIATED SEPTUM 09/04/2007   ALLERGIC RHINITIS 09/04/2007   GERD 09/04/2007   OSTEOARTHRITIS 09/04/2007   HEADACHE 09/04/2007    PCP:   Laurey Morale, MD    REFERRING PROVIDER: Paralee Cancel, MD  REFERRING DIAG: Z51.89 (ICD-10-CM) - Encounter for other specified aftercare  THERAPY DIAG:  Stiffness of left hip, not elsewhere classified  Pain in  left hip  Muscle weakness (generalized)  Rationale for Evaluation and Treatment Rehabilitation  ONSET DATE: 02/23/22  SUBJECTIVE:   SUBJECTIVE STATEMENT: Pt reports no changes since eval.   She will find out in end of July when her abdominal surgery is with [facility].  "My L leg feels very sturdy and my knee problems have gone away".    PERTINENT HISTORY: Abdominal hernia, smoker, COPD, A-Fib, 01/2015 colon resection  PAIN:  Are you having pain? No   PRECAUTIONS: Anterior hip  WEIGHT BEARING RESTRICTIONS No  FALLS:  Has patient fallen in last 6 months? No  LIVING ENVIRONMENT: Lives with: lives with their family Lives in: House/apartment Stairs: level home, 5 steps to enter Has following equipment at home: Single point cane and Environmental consultant - 4 wheeled  OCCUPATION: retired  PLOF: Independent with basic ADLs  PATIENT GOALS : return to walking and exercise; pt has a international trip coming up in March of 2024 and would like to be able to walk   OBJECTIVE:   DIAGNOSTIC FINDINGS:   IMPRESSION: Intraoperative images during left total hip arthroplasty. Normal alignment.  PATIENT SURVEYS:  FOTO 40 62 @ DC 12 pts MCII  COGNITION:  Overall cognitive status: Within functional limits for tasks assessed     SENSATION: WFL   POSTURE: flexed trunk , weight shift left  PALPATION: No TTP noted around ant or lateral hip  LOWER EXTREMITY ROM:  Passive ROM Right eval Left eval  Hip flexion 120 120 with hernia supported by UE; AROM weakness, unable to clear foot   Hip extension  0  Hip abduction    Hip adduction    Hip internal rotation 20 20  Hip external rotation 45 30 p!  Knee flexion [facility] WFL  Knee extension WFL WFL   (Blank rows = not tested)  LOWER EXTREMITY MMT:  MMT Right eval Left eval  Hip flexion 4+/5 3-/5  Hip extension 4+/5 4-/5  Hip abduction 4+/5 4-/5  Hip adduction 4+/5 4/5  Knee flexion 4+/5 4/5  Knee extension 4+/5 4/5   (Blank rows = not  tested)   FUNCTIONAL TESTS:  5 times sit to stand: >15s with UE supported needed  GAIT: Distance walked: 51f Assistive device utilized: Single point cane and Walker - 4 wheeled Level of assistance: Modified independence Comments: Able to walk safely with SPC. Decreased step length on L; limited extension on L, low foot clearance with L   Gait with Rolator WFL     TODAY'S TREATMENT: Pt seen for aquatic therapy today.  Treatment took place in water 3.25-4 ft in [facility] [employer] Temp of water was 91.  Pt entered/exited the pool via stairs independently with bilat rail.  Self care:  educated pt on scar mobilization and STM (with hand/ rolling pin) to thigh; demo provided and pt verbalized understanding  Warm up of walking forward/ backward and side stepping holding thick sqoodle - multiple reps Monster walk forward / backward with coordinated arms Holding wall- Heel raises Squats  Hip circles CW/CCW  * Forward step ups with LLE x 5 with BUE on rail, then reciprocal pattern up 3 and retro down x 3 reps Sit to/from stand on bench x 8 Attempted seated hip abdct/add; unable to tolerate    Pt requires buoyancy for support and to offload joints with strengthening exercises. Viscosity of the water is needed for resistance of strengthening; water current perturbations provides challenge to standing balance unsupported, requiring increased core activation.    PATIENT EDUCATION:  Education details: exercise progression, DOMS expectations Person educated: Patient Education method: Explanation, Demonstration, Tactile cues, Verbal cues, and Handouts Education comprehension: verbalized understanding, returned demonstration, verbal cues required, and tactile cues required   HOME EXERCISE PROGRAM: Access Code: [email or web address] URL: [email or web address] Date: 04/20/2022 Prepared by: Daleen Bo  ASSESSMENT:  CLINICAL IMPRESSION: Pt reintroduced to  aquatic environment.  She continues to be confident and independent in pool; able to take direction from therapist on deck. Initially she required heavy UE support on rails to ascend step with LLE; decreased UE support with repetition.  Tolerated all exercises without increase in pain. Pt would benefit from continued skilled therapy in order to reach goals and maximize functional L hip strength and ROM for prevention of further functional decline. Goals are ongoing.    OBJECTIVE IMPAIRMENTS Abnormal gait, cardiopulmonary status limiting activity, decreased activity tolerance, decreased balance, decreased endurance, decreased mobility, difficulty walking, decreased ROM, decreased strength, hypomobility, increased muscle spasms, impaired flexibility, improper body mechanics, postural dysfunction, and pain.   ACTIVITY LIMITATIONS carrying, lifting, bending, standing, squatting, stairs, transfers, bed mobility, bathing, toileting, dressing, and locomotion level  PARTICIPATION LIMITATIONS: meal prep, cleaning, laundry, interpersonal relationship, driving, shopping, community activity, yard work, and exercise  PERSONAL FACTORS Age, Fitness, Past/current experiences, Time since onset of injury/illness/exacerbation, and 3+ comorbidities:    are also affecting patient's functional outcome.   REHAB POTENTIAL: Good  CLINICAL DECISION MAKING: Evolving/moderate complexity  EVALUATION COMPLEXITY: Moderate   GOALS:   SHORT TERM GOALS: Target date: 06/01/2022  Pt will become independent with HEP in order to demonstrate synthesis of PT education. .  Goal status: INITIAL  2.  Pt will score at least 12 pt increase on FOTO to demonstrate functional improvement in MCII and pt perceived function.    Goal status: INITIAL  3.  Pt will be able to demonstrate normal gait on all surfaces with SPC in order to demonstrate functional improvement in LE function for self-care and house hold duties.   Goal status:  INITIAL  LONG TERM GOALS: Target date: 07/13/2022   Pt  will become independent with final HEP in order to demonstrate synthesis of PT education.   Goal status: INITIAL  2.  Pt will be able to demonstrate full reciprocal stair stepping with single UE in order to demonstrate functional improvement in LE function for self-care and house hold duties.  Goal status: INITIAL  3.  Pt will be able to demonstrate/report ability to walk >20 mins without pain in order to demonstrate functional improvement and tolerance to exercise and community mobility.   Goal status: INITIAL  4.  Pt will score >/= 62 on FOTO to demonstrate improvement in perceived L LE function.   Goal status: INITIAL  5.  Pt will be able to perform 5XSTS in under 12s  in order to demonstrate functional improvement above  the cut off score for adults.    Goal status: INITIAL    PLAN: PT FREQUENCY: 1-2x/week  PT DURATION: 12 weeks (likely DC by 10 wks)  PLANNED INTERVENTIONS: Therapeutic exercises, Therapeutic activity, Neuromuscular re-education, Balance training, Gait training, Patient/Family education, Joint manipulation, Joint mobilization, Stair training, Orthotic/Fit training, DME instructions, Aquatic Therapy, Dry Needling, Electrical stimulation, Spinal manipulation, Spinal mobilization, Cryotherapy, Moist heat, Compression bandaging, Taping, Vasopneumatic device, Traction, Ultrasound, Ionotophoresis '4mg'$ /ml Dexamethasone, Manual therapy, and Re-evaluation  PLAN FOR NEXT SESSION:  aquatics, balance, general hip strength, stair training  Kerin Perna, PTA 04/23/22 9:32 AM

## 2022-05-04 ENCOUNTER — Encounter: Payer: Self-pay | Admitting: Gastroenterology

## 2022-05-04 ENCOUNTER — Other Ambulatory Visit: Payer: Self-pay | Admitting: Family Medicine

## 2022-05-05 ENCOUNTER — Encounter (HOSPITAL_BASED_OUTPATIENT_CLINIC_OR_DEPARTMENT_OTHER): Payer: Self-pay | Admitting: Physical Therapy

## 2022-05-05 ENCOUNTER — Ambulatory Visit (HOSPITAL_BASED_OUTPATIENT_CLINIC_OR_DEPARTMENT_OTHER): Payer: BC Managed Care – PPO | Attending: Orthopedic Surgery | Admitting: Physical Therapy

## 2022-05-05 DIAGNOSIS — M25552 Pain in left hip: Secondary | ICD-10-CM | POA: Diagnosis present

## 2022-05-05 DIAGNOSIS — M25652 Stiffness of left hip, not elsewhere classified: Secondary | ICD-10-CM | POA: Insufficient documentation

## 2022-05-05 DIAGNOSIS — M6281 Muscle weakness (generalized): Secondary | ICD-10-CM | POA: Diagnosis present

## 2022-05-05 DIAGNOSIS — R262 Difficulty in walking, not elsewhere classified: Secondary | ICD-10-CM | POA: Insufficient documentation

## 2022-05-05 NOTE — Therapy (Signed)
OUTPATIENT PHYSICAL THERAPY LOWER EXTREMITY TREATMENT NOTE   Patient Name: Alison Best MRN: [id number] DOB:Aug 28, 1956, 66 y.o., female Today's Date: 05/05/2022   PT End of Session - 05/05/22 1636     Visit Number 3    Number of Visits 19    Authorization Type BCBS    PT Start Time 4403    PT Stop Time 4742    PT Time Calculation (min) 44 min    Activity Tolerance Patient tolerated treatment well    Behavior During Therapy [facility] for tasks assessed/performed             Past Medical History:  Diagnosis Date   Anal fissure    Arthritis    Bursitis    Constipation    COPD (chronic obstructive pulmonary disease) ([place])    Diverticulitis of colon 02/2015   Ectopic pregnancy    Emphysema of lung (HCC)    Frozen shoulder    Heart murmur    Hernia, abdominal    Hyperlipidemia    Hyperthyroidism    Infertility, female    Irregular heartbeat    Joint pain    Obesity    Osteoarthritis    Pneumonia    Pre-diabetes    Sleep apnea    Swallowing difficulty    Thyroid disease    hyperthyroid   Past Surgical History:  Procedure Laterality Date   APPENDECTOMY     CESAREAN SECTION     COLONOSCOPY  09/10/2010   per Dr. Acquanetta Sit, diverticulosis of descending colon and sigmoid, internal hemorrhoids, repeat in 10 yrs   COLOSTOMY N/A 02/20/2015   Procedure: COLOSTOMY;  Surgeon: Jackolyn Confer, MD;  [facility]: [facility];  Service: General;  Laterality: N/A;   COLOSTOMY REVISION  02/20/2015   Procedure: Sigmoid Colectomy;  Surgeon: Jackolyn Confer, MD;  [facility];  Service: General;;   CYSTOSCOPY N/A 02/20/2015   Procedure: Consuela Mimes;  Surgeon: Kathie Rhodes, MD;  [facility];  Service: Urology;  Laterality: N/A;   DILATION AND CURETTAGE OF UTERUS     x3   ECTOPIC PREGNANCY SURGERY     ILEO LOOP COLOSTOMY CLOSURE N/A 09/26/2015   Procedure: LAPAROSCOPIC LYSIS OF ADHESIONS, COLOSTOMY CLOSURE;  Surgeon: Jackolyn Confer, MD;  [facility];  Service:  General;  Laterality: N/A;   LAPAROTOMY N/A 02/20/2015   Procedure: Emergency EXPLORATORY and drainiage of interabdominal abcesses;  Surgeon: Jackolyn Confer, MD;  [facility];  Service: General;  Laterality: N/A;   SALPINGOOPHORECTOMY Left 02/20/2015   Procedure: SALPINGO OOPHORECTOMY;  Surgeon: Jackolyn Confer, MD;  [facility];  Service: General;  Laterality: Left;   [place] Left 02/23/2022   Procedure: TOTAL HIP ARTHROPLASTY ANTERIOR APPROACH;  Surgeon: Paralee Cancel, MD;  [facility];  Service: Orthopedics;  Laterality: Left;   Patient Active Problem List   Diagnosis Date Noted   Vitamin D deficiency 03/26/2022   S/P total left hip arthroplasty 02/23/2022   Adjustment disorder with depressed mood 11/18/2021   COVID-19 virus infection 07/07/2021   Psoriasis 01/15/2021   Chronic pain of left knee 01/15/2021   Ventral hernia without obstruction or gangrene 01/15/2021   Class 3 severe obesity with serious comorbidity and body mass index (BMI) of 40.0 to 44.9 in adult [facility]) 01/15/2019   Acne rosacea 05/10/2016   Multinodular goiter 02/27/2016   Subclinical thyrotoxicosis 02/26/2016   Colostomy in place [facility]) s/p colostomy reversal 09/26/15 09/26/2015   Prediabetes 07/30/2015   Perforation of  sigmoid colon ([place]) 02/20/2015   Diverticulitis of large intestine with abscess without bleeding 02/18/2015   OSA (obstructive sleep apnea) 05/09/2013   GASTROENTERITIS 08/18/2010   RAYNAUD'S SYNDROME 02/17/2009   PLANTAR FASCIITIS 02/17/2009   Asthma 06/12/2008   Hyperlipidemia 09/04/2007   DEVIATED SEPTUM 09/04/2007   ALLERGIC RHINITIS 09/04/2007   GERD 09/04/2007   OSTEOARTHRITIS 09/04/2007   HEADACHE 09/04/2007    PCP:   Laurey Morale, MD    REFERRING PROVIDER: Paralee Cancel, MD  REFERRING DIAG: Z51.89 (ICD-10-CM) - Encounter for other specified aftercare  THERAPY DIAG:  Stiffness of left hip, not elsewhere classified  Pain in  left hip  Muscle weakness (generalized)  Difficulty walking  Rationale for Evaluation and Treatment Rehabilitation  ONSET DATE: 02/23/22  SUBJECTIVE:   SUBJECTIVE STATEMENT: Left leg is weak, pain low more stiffness/uncomfortable, sleeping better".    PERTINENT HISTORY: Abdominal hernia, smoker, COPD, A-Fib, 01/2015 colon resection  PAIN:  Are you having pain? Yes Left hip: 5/10   PRECAUTIONS: Anterior hip  WEIGHT BEARING RESTRICTIONS No  FALLS:  Has patient fallen in last 6 months? No  LIVING ENVIRONMENT: Lives with: lives with their family Lives in: House/apartment Stairs: level home, 5 steps to enter Has following equipment at home: Single point cane and Environmental consultant - 4 wheeled  OCCUPATION: retired  PLOF: Independent with basic ADLs  PATIENT GOALS : return to walking and exercise; pt has a international trip coming up in March of 2024 and would like to be able to walk   OBJECTIVE:   DIAGNOSTIC FINDINGS:   IMPRESSION: Intraoperative images during left total hip arthroplasty. Normal alignment.  PATIENT SURVEYS:  FOTO 40 62 @ DC 12 pts MCII  COGNITION:  Overall cognitive status: Within functional limits for tasks assessed     SENSATION: WFL   POSTURE: flexed trunk , weight shift left  PALPATION: No TTP noted around ant or lateral hip  LOWER EXTREMITY ROM:  Passive ROM Right eval Left eval  Hip flexion 120 120 with hernia supported by UE; AROM weakness, unable to clear foot   Hip extension  0  Hip abduction    Hip adduction    Hip internal rotation 20 20  Hip external rotation 45 30 p!  Knee flexion [facility] WFL  Knee extension WFL WFL   (Blank rows = not tested)  LOWER EXTREMITY MMT:  MMT Right eval Left eval  Hip flexion 4+/5 3-/5  Hip extension 4+/5 4-/5  Hip abduction 4+/5 4-/5  Hip adduction 4+/5 4/5  Knee flexion 4+/5 4/5  Knee extension 4+/5 4/5   (Blank rows = not tested)   FUNCTIONAL TESTS:  5 times sit to stand: >15s with  UE supported needed  GAIT: Distance walked: 42f Assistive device utilized: Single point cane and Walker - 4 wheeled Level of assistance: Modified independence Comments: Able to walk safely with SPC. Decreased step length on L; limited extension on L, low foot clearance with L   Gait with Rolator WFL     TODAY'S TREATMENT: Pt seen for aquatic therapy today.  Water 3.25-4 ft in depth . Temp of water was 92.  Pt entered/exited the pool via stairs independently with bilat rail.   Warm up of walking forward/ backward and side stepping holding thick sqoodle - multiple reps Holding yellow noodle 3 ft- Heel raises Squats  Hip circles CW/CCW Hip flex/ext Hip openers  * Forward step ups  x 10 with BUE  then x 1 on rail, then reciprocal pattern up  3 and retro down x 3 reps Sit to/from stand  3rd step from bottom x10 Seated (4th step from bottom) hip abdct/add and flutter kicking 3x20 Walking between exercises with breast stroke arms for recovery   Pt requires buoyancy for support and to offload joints with strengthening exercises. Viscosity of the water is needed for resistance of strengthening; water current perturbations provides challenge to standing balance unsupported, requiring increased core activation.    PATIENT EDUCATION:  Education details: exercise progression, DOMS expectations Person educated: Patient Education method: Explanation, Demonstration, Tactile cues, Verbal cues, and Handouts Education comprehension: verbalized understanding, returned demonstration, verbal cues required, and tactile cues required   HOME EXERCISE PROGRAM: Access Code: [email or web address] URL: [email or web address] Date: 04/20/2022 Prepared by: Daleen Bo  ASSESSMENT:  CLINICAL IMPRESSION: Progressed strengthening of lle to include core strength and balance challenges which she tolerates well. No LOB , minor occasional unsteadiness with ue supported on yellow buoy. Does have 1 episode  of discomfort turning to sit on pool step.  Recovered with continued exercise and walking.  She reports compliance with HEP and using roller pin for STM and scar tissue mobilization. Goals ongoing     OBJECTIVE IMPAIRMENTS Abnormal gait, cardiopulmonary status limiting activity, decreased activity tolerance, decreased balance, decreased endurance, decreased mobility, difficulty walking, decreased ROM, decreased strength, hypomobility, increased muscle spasms, impaired flexibility, improper body mechanics, postural dysfunction, and pain.   ACTIVITY LIMITATIONS carrying, lifting, bending, standing, squatting, stairs, transfers, bed mobility, bathing, toileting, dressing, and locomotion level  PARTICIPATION LIMITATIONS: meal prep, cleaning, laundry, interpersonal relationship, driving, shopping, community activity, yard work, and exercise  PERSONAL FACTORS Age, Fitness, Past/current experiences, Time since onset of injury/illness/exacerbation, and 3+ comorbidities:    are also affecting patient's functional outcome.   REHAB POTENTIAL: Good  CLINICAL DECISION MAKING: Evolving/moderate complexity  EVALUATION COMPLEXITY: Moderate   GOALS:   SHORT TERM GOALS: Target date: 06/01/2022  Pt will become independent with HEP in order to demonstrate synthesis of PT education. .  Goal status: INITIAL  2.  Pt will score at least 12 pt increase on FOTO to demonstrate functional improvement in MCII and pt perceived function.    Goal status: INITIAL  3.  Pt will be able to demonstrate normal gait on all surfaces with SPC in order to demonstrate functional improvement in LE function for self-care and house hold duties.   Goal status: INITIAL  LONG TERM GOALS: Target date: 07/13/2022   Pt  will become independent with final HEP in order to demonstrate synthesis of PT education.   Goal status: INITIAL  2.  Pt will be able to demonstrate full reciprocal stair stepping with single UE in order to  demonstrate functional improvement in LE function for self-care and house hold duties.  Goal status: INITIAL  3.  Pt will be able to demonstrate/report ability to walk >20 mins without pain in order to demonstrate functional improvement and tolerance to exercise and community mobility.   Goal status: INITIAL  4.  Pt will score >/= 62 on FOTO to demonstrate improvement in perceived L LE function.   Goal status: INITIAL  5.  Pt will be able to perform 5XSTS in under 12s  in order to demonstrate functional improvement above the cut off score for adults.    Goal status: INITIAL    PLAN: PT FREQUENCY: 1-2x/week  PT DURATION: 12 weeks (likely DC by 10 wks)  PLANNED INTERVENTIONS: Therapeutic exercises, Therapeutic activity, Neuromuscular re-education, Balance training, Gait training, Patient/Family education, Joint manipulation, Joint mobilization,  Stair training, Orthotic/Fit training, DME instructions, Aquatic Therapy, Dry Needling, Electrical stimulation, Spinal manipulation, Spinal mobilization, Cryotherapy, Moist heat, Compression bandaging, Taping, Vasopneumatic device, Traction, Ultrasound, Ionotophoresis '4mg'$ /ml Dexamethasone, Manual therapy, and Re-evaluation  PLAN FOR NEXT SESSION:  aquatics, balance, general hip strength, stair training  Stanton Kidney Tharon Aquas) Ketzaly Cardella MPT 05/05/22 6:41 PM

## 2022-05-11 ENCOUNTER — Encounter (HOSPITAL_BASED_OUTPATIENT_CLINIC_OR_DEPARTMENT_OTHER): Payer: Self-pay | Admitting: Physical Therapy

## 2022-05-11 ENCOUNTER — Ambulatory Visit (HOSPITAL_BASED_OUTPATIENT_CLINIC_OR_DEPARTMENT_OTHER): Payer: BC Managed Care – PPO | Admitting: Physical Therapy

## 2022-05-11 DIAGNOSIS — M25652 Stiffness of left hip, not elsewhere classified: Secondary | ICD-10-CM

## 2022-05-11 DIAGNOSIS — R262 Difficulty in walking, not elsewhere classified: Secondary | ICD-10-CM

## 2022-05-11 DIAGNOSIS — M25552 Pain in left hip: Secondary | ICD-10-CM

## 2022-05-11 DIAGNOSIS — M6281 Muscle weakness (generalized): Secondary | ICD-10-CM

## 2022-05-11 NOTE — Therapy (Signed)
OUTPATIENT PHYSICAL THERAPY LOWER EXTREMITY TREATMENT NOTE   Patient Name: Alison Best MRN: [id number] DOB:12/28/1955, 66 y.o., female Today's Date: 05/11/2022     Past Medical History:  Diagnosis Date   Anal fissure    Arthritis    Bursitis    Constipation    COPD (chronic obstructive pulmonary disease) ([place])    Diverticulitis of colon 02/2015   Ectopic pregnancy    Emphysema of lung (HCC)    Frozen shoulder    Heart murmur    Hernia, abdominal    Hyperlipidemia    Hyperthyroidism    Infertility, female    Irregular heartbeat    Joint pain    Obesity    Osteoarthritis    Pneumonia    Pre-diabetes    Sleep apnea    Swallowing difficulty    Thyroid disease    hyperthyroid   Past Surgical History:  Procedure Laterality Date   APPENDECTOMY     CESAREAN SECTION     COLONOSCOPY  09/10/2010   per Dr. Acquanetta Sit, diverticulosis of descending colon and sigmoid, internal hemorrhoids, repeat in 10 yrs   COLOSTOMY N/A 02/20/2015   Procedure: COLOSTOMY;  Surgeon: Jackolyn Confer, MD;  [facility];  Service: General;  Laterality: N/A;   COLOSTOMY REVISION  02/20/2015   Procedure: Sigmoid Colectomy;  Surgeon: Jackolyn Confer, MD;  [facility];  Service: General;;   CYSTOSCOPY N/A 02/20/2015   Procedure: Consuela Mimes;  Surgeon: Kathie Rhodes, MD;  [facility];  Service: Urology;  Laterality: N/A;   DILATION AND CURETTAGE OF UTERUS     x3   ECTOPIC PREGNANCY SURGERY     ILEO LOOP COLOSTOMY CLOSURE N/A 09/26/2015   Procedure: LAPAROSCOPIC LYSIS OF ADHESIONS, COLOSTOMY CLOSURE;  Surgeon: Jackolyn Confer, MD;  [facility];  Service: General;  Laterality: N/A;   LAPAROTOMY N/A 02/20/2015   Procedure: Emergency EXPLORATORY and drainiage of interabdominal abcesses;  Surgeon: Jackolyn Confer, MD;  [facility];  Service: General;  Laterality: N/A;   SALPINGOOPHORECTOMY Left 02/20/2015   Procedure: SALPINGO OOPHORECTOMY;  Surgeon: Jackolyn Confer,  MD;  [facility];  Service: General;  Laterality: Left;   [place] Left 02/23/2022   Procedure: TOTAL HIP ARTHROPLASTY ANTERIOR APPROACH;  Surgeon: Paralee Cancel, MD;  [facility];  Service: Orthopedics;  Laterality: Left;   Patient Active Problem List   Diagnosis Date Noted   Vitamin D deficiency 03/26/2022   S/P total left hip arthroplasty 02/23/2022   Adjustment disorder with depressed mood 11/18/2021   COVID-19 virus infection 07/07/2021   Psoriasis 01/15/2021   Chronic pain of left knee 01/15/2021   Ventral hernia without obstruction or gangrene 01/15/2021   Class 3 severe obesity with serious comorbidity and body mass index (BMI) of 40.0 to 44.9 in adult [facility]) 01/15/2019   Acne rosacea 05/10/2016   Multinodular goiter 02/27/2016   Subclinical thyrotoxicosis 02/26/2016   Colostomy in place [facility]) s/p colostomy reversal 09/26/15 09/26/2015   Prediabetes 07/30/2015   Perforation of sigmoid colon ([place]) 02/20/2015   Diverticulitis of large intestine with abscess without bleeding 02/18/2015   OSA (obstructive sleep apnea) 05/09/2013   GASTROENTERITIS 08/18/2010   RAYNAUD'S SYNDROME 02/17/2009   PLANTAR FASCIITIS 02/17/2009   Asthma 06/12/2008   Hyperlipidemia 09/04/2007   DEVIATED SEPTUM 09/04/2007   ALLERGIC RHINITIS 09/04/2007   GERD 09/04/2007   OSTEOARTHRITIS 09/04/2007   HEADACHE 09/04/2007    PCP:   Laurey Morale, MD  REFERRING PROVIDER: Paralee Cancel, MD  REFERRING DIAG: Z51.89 (ICD-10-CM) - Encounter for other specified aftercare  THERAPY DIAG:  No diagnosis found.  Rationale for Evaluation and Treatment Rehabilitation  ONSET DATE: 02/23/22  SUBJECTIVE:   SUBJECTIVE STATEMENT: "Using roller pin has helped with the scar tissue.".    PERTINENT HISTORY: Abdominal hernia, smoker, COPD, A-Fib, 01/2015 colon resection  PAIN:  Are you having pain? Yes Left hip: 5/10   PRECAUTIONS: Anterior hip  WEIGHT BEARING  RESTRICTIONS No  FALLS:  Has patient fallen in last 6 months? No  LIVING ENVIRONMENT: Lives with: lives with their family Lives in: House/apartment Stairs: level home, 5 steps to enter Has following equipment at home: Single point cane and Environmental consultant - 4 wheeled  OCCUPATION: retired  PLOF: Independent with basic ADLs  PATIENT GOALS : return to walking and exercise; pt has a international trip coming up in March of 2024 and would like to be able to walk   OBJECTIVE:   DIAGNOSTIC FINDINGS:   IMPRESSION: Intraoperative images during left total hip arthroplasty. Normal alignment.  PATIENT SURVEYS:  FOTO 40 62 @ DC 12 pts MCII  COGNITION:  Overall cognitive status: Within functional limits for tasks assessed     SENSATION: WFL   POSTURE: flexed trunk , weight shift left  PALPATION: No TTP noted around ant or lateral hip  LOWER EXTREMITY ROM:  Passive ROM Right eval Left eval  Hip flexion 120 120 with hernia supported by UE; AROM weakness, unable to clear foot   Hip extension  0  Hip abduction    Hip adduction    Hip internal rotation 20 20  Hip external rotation 45 30 p!  Knee flexion [facility] WFL  Knee extension WFL WFL   (Blank rows = not tested)  LOWER EXTREMITY MMT:  MMT Right eval Left eval  Hip flexion 4+/5 3-/5  Hip extension 4+/5 4-/5  Hip abduction 4+/5 4-/5  Hip adduction 4+/5 4/5  Knee flexion 4+/5 4/5  Knee extension 4+/5 4/5   (Blank rows = not tested)   FUNCTIONAL TESTS:  5 times sit to stand: >15s with UE supported needed  GAIT: Distance walked: 40f Assistive device utilized: Single point cane and Walker - 4 wheeled Level of assistance: Modified independence Comments: Able to walk safely with SPC. Decreased step length on L; limited extension on L, low foot clearance with L   Gait with Rolator WFL     TODAY'S TREATMENT: Pt seen for aquatic therapy today.  Water 3.25-4 ft in depth . Temp of water was 92.  Pt entered/exited the  pool via stairs independently with bilat rail.   Warm up of walking forward/ backward and side stepping holding thick sqoodle - multiple reps Standing Heel raises/toe raises completed on 1st step Squats 2x12 completed on 1 step Hip circles CW/CCW x12 holding to noodle Hip flex/ext x12 holding to noodle Hip openers x12 holding to noodle  * Forward step ups  x 12 without ue support leading R/L; SL Left step up side step x12 Monster walk x 3 widths Hurdle walk x 3 widths Sit to/from stand 4th step from bottom x10 Seated (4th step from bottom) hip abdct/add and flutter kicking 3x20 Walking between exercises with breast stroke arms for recovery Standing quad and hip flex stretch holding to wall Standing hamstring and gastroc stretch on 2 step.   Pt requires buoyancy for support and to offload joints with strengthening exercises. Viscosity of the water is needed for resistance of strengthening;  water current perturbations provides challenge to standing balance unsupported, requiring increased core activation.    PATIENT EDUCATION:  Education details: exercise progression, DOMS expectations Person educated: Patient Education method: Explanation, Demonstration, Tactile cues, Verbal cues, and Handouts Education comprehension: verbalized understanding, returned demonstration, verbal cues required, and tactile cues required   HOME EXERCISE PROGRAM: Access Code: [email or web address] URL: [email or web address] Date: 04/20/2022 Prepared by: Daleen Bo  ASSESSMENT:  CLINICAL IMPRESSION: Progressed with balance challenges decreasing ue support with exercise.  Increased strength challenge with decreasing submersion with some exercises then adding forward propulsion with multiplanar hip exercises. Pt able to rise from 4th step (increased weight bearing) gaining immediate standing balance without LOB. Good progress towards goals.     OBJECTIVE IMPAIRMENTS Abnormal gait, cardiopulmonary  status limiting activity, decreased activity tolerance, decreased balance, decreased endurance, decreased mobility, difficulty walking, decreased ROM, decreased strength, hypomobility, increased muscle spasms, impaired flexibility, improper body mechanics, postural dysfunction, and pain.   ACTIVITY LIMITATIONS carrying, lifting, bending, standing, squatting, stairs, transfers, bed mobility, bathing, toileting, dressing, and locomotion level  PARTICIPATION LIMITATIONS: meal prep, cleaning, laundry, interpersonal relationship, driving, shopping, community activity, yard work, and exercise  PERSONAL FACTORS Age, Fitness, Past/current experiences, Time since onset of injury/illness/exacerbation, and 3+ comorbidities:    are also affecting patient's functional outcome.   REHAB POTENTIAL: Good  CLINICAL DECISION MAKING: Evolving/moderate complexity  EVALUATION COMPLEXITY: Moderate   GOALS:   SHORT TERM GOALS: Target date: 06/01/2022  Pt will become independent with HEP in order to demonstrate synthesis of PT education. .  Goal status: INITIAL  2.  Pt will score at least 12 pt increase on FOTO to demonstrate functional improvement in MCII and pt perceived function.    Goal status: INITIAL  3.  Pt will be able to demonstrate normal gait on all surfaces with SPC in order to demonstrate functional improvement in LE function for self-care and house hold duties.   Goal status: INITIAL  LONG TERM GOALS: Target date: 07/13/2022   Pt  will become independent with final HEP in order to demonstrate synthesis of PT education.   Goal status: INITIAL  2.  Pt will be able to demonstrate full reciprocal stair stepping with single UE in order to demonstrate functional improvement in LE function for self-care and house hold duties.  Goal status: INITIAL  3.  Pt will be able to demonstrate/report ability to walk >20 mins without pain in order to demonstrate functional improvement and tolerance to  exercise and community mobility.   Goal status: INITIAL  4.  Pt will score >/= 62 on FOTO to demonstrate improvement in perceived L LE function.   Goal status: INITIAL  5.  Pt will be able to perform 5XSTS in under 12s  in order to demonstrate functional improvement above the cut off score for adults.    Goal status: INITIAL    PLAN: PT FREQUENCY: 1-2x/week  PT DURATION: 12 weeks (likely DC by 10 wks)  PLANNED INTERVENTIONS: Therapeutic exercises, Therapeutic activity, Neuromuscular re-education, Balance training, Gait training, Patient/Family education, Joint manipulation, Joint mobilization, Stair training, Orthotic/Fit training, DME instructions, Aquatic Therapy, Dry Needling, Electrical stimulation, Spinal manipulation, Spinal mobilization, Cryotherapy, Moist heat, Compression bandaging, Taping, Vasopneumatic device, Traction, Ultrasound, Ionotophoresis '4mg'$ /ml Dexamethasone, Manual therapy, and Re-evaluation  PLAN FOR NEXT SESSION:  aquatics, balance, general hip strength, stair training  Dillon Livermore ([place]) Veroncia Jezek MPT 05/11/22 8:59 AM

## 2022-05-14 ENCOUNTER — Encounter (HOSPITAL_BASED_OUTPATIENT_CLINIC_OR_DEPARTMENT_OTHER): Payer: Self-pay | Admitting: Physical Therapy

## 2022-05-14 ENCOUNTER — Ambulatory Visit (HOSPITAL_BASED_OUTPATIENT_CLINIC_OR_DEPARTMENT_OTHER): Payer: BC Managed Care – PPO | Admitting: Physical Therapy

## 2022-05-14 DIAGNOSIS — M6281 Muscle weakness (generalized): Secondary | ICD-10-CM

## 2022-05-14 DIAGNOSIS — M25552 Pain in left hip: Secondary | ICD-10-CM

## 2022-05-14 DIAGNOSIS — M25652 Stiffness of left hip, not elsewhere classified: Secondary | ICD-10-CM

## 2022-05-14 NOTE — Therapy (Addendum)
OUTPATIENT PHYSICAL THERAPY LOWER EXTREMITY TREATMENT NOTE   Patient Name: Alison Best MRN: [id number] DOB:17-Oct-1956, 66 y.o., female Today's Date: 05/14/2022   PT End of Session - 05/14/22 0955     Visit Number 5    Number of Visits 19    Authorization Type BCBS    PT Start Time [phone]    PT Stop Time 1030    PT Time Calculation (min) 44 min    Activity Tolerance Patient tolerated treatment well    Behavior During Therapy [facility] for tasks assessed/performed              Past Medical History:  Diagnosis Date   Anal fissure    Arthritis    Bursitis    Constipation    COPD (chronic obstructive pulmonary disease) ([place])    Diverticulitis of colon 02/2015   Ectopic pregnancy    Emphysema of lung (H[facility])    Frozen shoulder    Heart murmur    Hernia, abdominal    Hyperlipidemia    Hyperthyroidism    Infertility, female    Irregular heartbeat    Joint pain    Obesity    Osteoarthritis    Pneumonia    Pre-diabetes    Sleep apnea    Swallowing difficulty    Thyroid disease    hyperthyroid   Past Surgical History:  Procedure Laterality Date   APPENDECTOMY     CESAREAN SECTION     COLONOSCOPY  09/10/2010   per Dr. Acquanetta Sit, diverticulosis of descending colon and sigmoid, internal hemorrhoids, repeat in 10 yrs   COLOSTOMY N/A 02/20/2015   Procedure: COLOSTOMY;  Surgeon: Jackolyn Confer, MD;  [facility]: [facility];  Service: General;  Laterality: N/A;   COLOSTOMY REVISION  02/20/2015   Procedure: Sigmoid Colectomy;  Surgeon: Jackolyn Confer, MD;  [facility];  Service: General;;   CYSTOSCOPY N/A 02/20/2015   Procedure: Consuela Mimes;  Surgeon: Kathie Rhodes, MD;  [facility];  Service: Urology;  Laterality: N/A;   DILATION AND CURETTAGE OF UTERUS     x3   ECTOPIC PREGNANCY SURGERY     ILEO LOOP COLOSTOMY CLOSURE N/A 09/26/2015   Procedure: LAPAROSCOPIC LYSIS OF ADHESIONS, COLOSTOMY CLOSURE;  Surgeon: Jackolyn Confer, MD;  [facility];  Service:  General;  Laterality: N/A;   LAPAROTOMY N/A 02/20/2015   Procedure: Emergency EXPLORATORY and drainiage of interabdominal abcesses;  Surgeon: Jackolyn Confer, MD;  [facility];  Service: General;  Laterality: N/A;   SALPINGOOPHORECTOMY Left 02/20/2015   Procedure: SALPINGO OOPHORECTOMY;  Surgeon: Jackolyn Confer, MD;  [facility];  Service: General;  Laterality: Left;   [place] Left 02/23/2022   Procedure: TOTAL HIP ARTHROPLASTY ANTERIOR APPROACH;  Surgeon: Paralee Cancel, MD;  [facility];  Service: Orthopedics;  Laterality: Left;   Patient Active Problem List   Diagnosis Date Noted   Vitamin D deficiency 03/26/2022   S/P total left hip arthroplasty 02/23/2022   Adjustment disorder with depressed mood 11/18/2021   COVID-19 virus infection 07/07/2021   Psoriasis 01/15/2021   Chronic pain of left knee 01/15/2021   Ventral hernia without obstruction or gangrene 01/15/2021   Class 3 severe obesity with serious comorbidity and body mass index (BMI) of 40.0 to 44.9 in adult [facility]) 01/15/2019   Acne rosacea 05/10/2016   Multinodular goiter 02/27/2016   Subclinical thyrotoxicosis 02/26/2016   Colostomy in place [facility]) s/p colostomy reversal 09/26/15 09/26/2015   Prediabetes 07/30/2015   Perforation  of sigmoid colon ([place]) 02/20/2015   Diverticulitis of large intestine with abscess without bleeding 02/18/2015   OSA (obstructive sleep apnea) 05/09/2013   GASTROENTERITIS 08/18/2010   RAYNAUD'S SYNDROME 02/17/2009   PLANTAR FASCIITIS 02/17/2009   Asthma 06/12/2008   Hyperlipidemia 09/04/2007   DEVIATED SEPTUM 09/04/2007   ALLERGIC RHINITIS 09/04/2007   GERD 09/04/2007   OSTEOARTHRITIS 09/04/2007   HEADACHE 09/04/2007    PCP:   Laurey Morale, MD    REFERRING PROVIDER: Paralee Cancel, MD  REFERRING DIAG: Z51.89 (ICD-10-CM) - Encounter for other specified aftercare  THERAPY DIAG:  Stiffness of left hip, not elsewhere classified  Pain in  left hip  Muscle weakness (generalized)  Rationale for Evaluation and Treatment Rehabilitation  ONSET DATE: 02/23/22  SUBJECTIVE:   SUBJECTIVE STATEMENT: "No pain this morning so far and I have not taken my Celebrex either".    PERTINENT HISTORY: Abdominal hernia, smoker, COPD, A-Fib, 01/2015 colon resection  PAIN:  Are you having pain? no Left hip: 0/10   PRECAUTIONS: Anterior hip  WEIGHT BEARING RESTRICTIONS No  FALLS:  Has patient fallen in last 6 months? No  LIVING ENVIRONMENT: Lives with: lives with their family Lives in: House/apartment Stairs: level home, 5 steps to enter Has following equipment at home: Single point cane and Environmental consultant - 4 wheeled  OCCUPATION: retired  PLOF: Independent with basic ADLs  PATIENT GOALS : return to walking and exercise; pt has a international trip coming up in March of 2024 and would like to be able to walk   OBJECTIVE:   DIAGNOSTIC FINDINGS:   IMPRESSION: Intraoperative images during left total hip arthroplasty. Normal alignment.  PATIENT SURVEYS:  FOTO 40 62 @ DC 12 pts MCII  COGNITION:  Overall cognitive status: Within functional limits for tasks assessed     SENSATION: WFL   POSTURE: flexed trunk , weight shift left  PALPATION: No TTP noted around ant or lateral hip  LOWER EXTREMITY ROM:  Passive ROM Right eval Left eval  Hip flexion 120 120 with hernia supported by UE; AROM weakness, unable to clear foot   Hip extension  0  Hip abduction    Hip adduction    Hip internal rotation 20 20  Hip external rotation 45 30 p!  Knee flexion [facility] WFL  Knee extension WFL WFL   (Blank rows = not tested)  LOWER EXTREMITY MMT:  MMT Right eval Left eval  Hip flexion 4+/5 3-/5  Hip extension 4+/5 4-/5  Hip abduction 4+/5 4-/5  Hip adduction 4+/5 4/5  Knee flexion 4+/5 4/5  Knee extension 4+/5 4/5   (Blank rows = not tested)   FUNCTIONAL TESTS:  5 times sit to stand: >15s with UE supported  needed  GAIT: Distance walked: 24f Assistive device utilized: Single point cane and Walker - 4 wheeled Level of assistance: Modified independence Comments: Able to walk safely with SPC. Decreased step length on L; limited extension on L, low foot clearance with L   Gait with Rolator WFL     TODAY'S TREATMENT: Pt seen for aquatic therapy today.  Water 3.25-4 ft in depth . Temp of water was 92.  Pt entered/exited the pool via stairs independently with bilat rail.   Warm up of walking forward/ backward and side stepping holding thick sqoodle - multiple reps Cycling  with breast stroke arms forward and back 2 widths ea; scissor and skii 2x20 Standing Heel raises/toe raises completed on 1st step Squats 2x12 completed on 1 step Hip circles CW/CCW x12  Hip flex/ext x12 holding to noodle LLE only Hip openers x12   * Forward step ups  x 12 without ue support leading R/L; SL Left step up side step x12; backward x 10 R/L Sit to/from stand 3rd step from bottom x10 Standing quad and hip flex stretch holding to wall Standing hamstring and gastroc stretch on 2 step. Walking between exercises recovery   Pt requires buoyancy for support and to offload joints with strengthening exercises. Viscosity of the water is needed for resistance of strengthening; water current perturbations provides challenge to standing balance unsupported, requiring increased core activation.    PATIENT EDUCATION:  Education details: exercise progression, DOMS expectations Person educated: Patient Education method: Explanation, Demonstration, Tactile cues, Verbal cues, and Handouts Education comprehension: verbalized understanding, returned demonstration, verbal cues required, and tactile cues required   HOME EXERCISE PROGRAM: Access Code: [email or web address] URL: [email or web address] Date: 04/20/2022 Prepared by: Daleen Bo  ASSESSMENT:  CLINICAL IMPRESSION: Continued to progress core strength and  balance eliminating ue support with 2 standing ex.  Min cues for abdominal bracing for improved control. She maintains balance well with minimal unsteadiness. She does reports some increase in soreness along incision as session progresses.  Returned to [employer] support. Left hip extension appears full. Pt has small area (pinpoint) about mid incision (off of scar line) where she reports pulling something small out of yesterday maybe a piece of a stitch. Area with dry skin today and reddened.  She is instructed to keep clean and watch for any increase in swelling/redness or drainage. Goals ongoing       OBJECTIVE IMPAIRMENTS Abnormal gait, cardiopulmonary status limiting activity, decreased activity tolerance, decreased balance, decreased endurance, decreased mobility, difficulty walking, decreased ROM, decreased strength, hypomobility, increased muscle spasms, impaired flexibility, improper body mechanics, postural dysfunction, and pain.   ACTIVITY LIMITATIONS carrying, lifting, bending, standing, squatting, stairs, transfers, bed mobility, bathing, toileting, dressing, and locomotion level  PARTICIPATION LIMITATIONS: meal prep, cleaning, laundry, interpersonal relationship, driving, shopping, community activity, yard work, and exercise  PERSONAL FACTORS Age, Fitness, Past/current experiences, Time since onset of injury/illness/exacerbation, and 3+ comorbidities:    are also affecting patient's functional outcome.   REHAB POTENTIAL: Good  CLINICAL DECISION MAKING: Evolving/moderate complexity  EVALUATION COMPLEXITY: Moderate   GOALS:   SHORT TERM GOALS: Target date: 06/01/2022  Pt will become independent with HEP in order to demonstrate synthesis of PT education. .  Goal status: INITIAL  2.  Pt will score at least 12 pt increase on FOTO to demonstrate functional improvement in MCII and pt perceived function.    Goal status: INITIAL  3.  Pt will be able to demonstrate normal gait on all  surfaces with SPC in order to demonstrate functional improvement in LE function for self-care and house hold duties.   Goal status: INITIAL  LONG TERM GOALS: Target date: 07/13/2022   Pt  will become independent with final HEP in order to demonstrate synthesis of PT education.   Goal status: INITIAL  2.  Pt will be able to demonstrate full reciprocal stair stepping with single UE in order to demonstrate functional improvement in LE function for self-care and house hold duties.  Goal status: INITIAL  3.  Pt will be able to demonstrate/report ability to walk >20 mins without pain in order to demonstrate functional improvement and tolerance to exercise and community mobility.   Goal status: INITIAL  4.  Pt will score >/= 62 on FOTO to demonstrate improvement in perceived L LE function.   Goal  status: INITIAL  5.  Pt will be able to perform 5XSTS in under 12s  in order to demonstrate functional improvement above the cut off score for adults.    Goal status: INITIAL    PLAN: PT FREQUENCY: 1-2x/week  PT DURATION: 12 weeks (likely DC by 10 wks)  PLANNED INTERVENTIONS: Therapeutic exercises, Therapeutic activity, Neuromuscular re-education, Balance training, Gait training, Patient/Family education, Joint manipulation, Joint mobilization, Stair training, Orthotic/Fit training, DME instructions, Aquatic Therapy, Dry Needling, Electrical stimulation, Spinal manipulation, Spinal mobilization, Cryotherapy, Moist heat, Compression bandaging, Taping, Vasopneumatic device, Traction, Ultrasound, Ionotophoresis '4mg'$ /ml Dexamethasone, Manual therapy, and Re-evaluation  PLAN FOR NEXT SESSION:  aquatics, balance, general hip strength, stair training  [name] Woollard ([place]) Kamar Callender MPT 05/14/22 10:00 AM

## 2022-05-17 ENCOUNTER — Encounter: Payer: Self-pay | Admitting: Family Medicine

## 2022-05-18 ENCOUNTER — Ambulatory Visit (HOSPITAL_BASED_OUTPATIENT_CLINIC_OR_DEPARTMENT_OTHER): Payer: BC Managed Care – PPO | Admitting: Physical Therapy

## 2022-05-18 ENCOUNTER — Encounter (HOSPITAL_BASED_OUTPATIENT_CLINIC_OR_DEPARTMENT_OTHER): Payer: Self-pay | Admitting: Physical Therapy

## 2022-05-18 DIAGNOSIS — R262 Difficulty in walking, not elsewhere classified: Secondary | ICD-10-CM

## 2022-05-18 DIAGNOSIS — M25552 Pain in left hip: Secondary | ICD-10-CM

## 2022-05-18 DIAGNOSIS — M6281 Muscle weakness (generalized): Secondary | ICD-10-CM

## 2022-05-18 DIAGNOSIS — M25652 Stiffness of left hip, not elsewhere classified: Secondary | ICD-10-CM

## 2022-05-18 NOTE — Therapy (Signed)
OUTPATIENT PHYSICAL THERAPY LOWER EXTREMITY TREATMENT NOTE   Patient Name: Alison Best MRN: [id number] DOB:03-Mar-1956, 66 y.o., female Today's Date: 05/18/2022   PT End of Session - 05/18/22 1703     Visit Number 6    Number of Visits 19    Date for PT Re-Evaluation 07/19/22    Authorization Type BCBS    PT Start Time 1655    PT Stop Time 1740    PT Time Calculation (min) 45 min    Activity Tolerance Patient tolerated treatment well    Behavior During Therapy [facility] for tasks assessed/performed               Past Medical History:  Diagnosis Date   Anal fissure    Arthritis    Bursitis    Constipation    COPD (chronic obstructive pulmonary disease) ([place])    Diverticulitis of colon 02/2015   Ectopic pregnancy    Emphysema of lung ([facility])    Frozen shoulder    Heart murmur    Hernia, abdominal    Hyperlipidemia    Hyperthyroidism    Infertility, female    Irregular heartbeat    Joint pain    Obesity    Osteoarthritis    Pneumonia    Pre-diabetes    Sleep apnea    Swallowing difficulty    Thyroid disease    hyperthyroid   Past Surgical History:  Procedure Laterality Date   APPENDECTOMY     CESAREAN SECTION     COLONOSCOPY  09/10/2010   per Dr. Acquanetta Sit, diverticulosis of descending colon and sigmoid, internal hemorrhoids, repeat in 10 yrs   COLOSTOMY N/A 02/20/2015   Procedure: COLOSTOMY;  Surgeon: Jackolyn Confer, MD;  [facility];  Service: General;  Laterality: N/A;   COLOSTOMY REVISION  02/20/2015   Procedure: Sigmoid Colectomy;  Surgeon: Jackolyn Confer, MD;  [facility];  Service: General;;   CYSTOSCOPY N/A 02/20/2015   Procedure: Consuela Mimes;  Surgeon: Kathie Rhodes, MD;  [facility];  Service: Urology;  Laterality: N/A;   DILATION AND CURETTAGE OF UTERUS     x3   ECTOPIC PREGNANCY SURGERY     ILEO LOOP COLOSTOMY CLOSURE N/A 09/26/2015   Procedure: LAPAROSCOPIC LYSIS OF ADHESIONS, COLOSTOMY CLOSURE;  Surgeon: Jackolyn Confer, MD;  [facility];  Service: General;  Laterality: N/A;   LAPAROTOMY N/A 02/20/2015   Procedure: Emergency EXPLORATORY and drainiage of interabdominal abcesses;  Surgeon: Jackolyn Confer, MD;  [facility];  Service: General;  Laterality: N/A;   SALPINGOOPHORECTOMY Left 02/20/2015   Procedure: SALPINGO OOPHORECTOMY;  Surgeon: Jackolyn Confer, MD;  [facility];  Service: General;  Laterality: Left;   [place] Left 02/23/2022   Procedure: TOTAL HIP ARTHROPLASTY ANTERIOR APPROACH;  Surgeon: Paralee Cancel, MD;  [facility];  Service: Orthopedics;  Laterality: Left;   Patient Active Problem List   Diagnosis Date Noted   Vitamin D deficiency 03/26/2022   S/P total left hip arthroplasty 02/23/2022   Adjustment disorder with depressed mood 11/18/2021   COVID-19 virus infection 07/07/2021   Psoriasis 01/15/2021   Chronic pain of left knee 01/15/2021   Ventral hernia without obstruction or gangrene 01/15/2021   Class 3 severe obesity with serious comorbidity and body mass index (BMI) of 40.0 to 44.9 in adult [facility]) 01/15/2019   Acne rosacea 05/10/2016   Multinodular goiter 02/27/2016   Subclinical thyrotoxicosis 02/26/2016   Colostomy in place [facility]) s/p colostomy reversal  09/26/15 09/26/2015   Prediabetes 07/30/2015   Perforation of sigmoid colon ([place]) 02/20/2015   Diverticulitis of large intestine with abscess without bleeding 02/18/2015   OSA (obstructive sleep apnea) 05/09/2013   GASTROENTERITIS 08/18/2010   RAYNAUD'S SYNDROME 02/17/2009   PLANTAR FASCIITIS 02/17/2009   Asthma 06/12/2008   Hyperlipidemia 09/04/2007   DEVIATED SEPTUM 09/04/2007   ALLERGIC RHINITIS 09/04/2007   GERD 09/04/2007   OSTEOARTHRITIS 09/04/2007   HEADACHE 09/04/2007    PCP:   Laurey Morale, MD    REFERRING PROVIDER: Paralee Cancel, MD  REFERRING DIAG: Z51.89 (ICD-10-CM) - Encounter for other specified aftercare  THERAPY DIAG:  Stiffness of  left hip, not elsewhere classified  Pain in left hip  Muscle weakness (generalized)  Difficulty walking  Rationale for Evaluation and Treatment Rehabilitation  ONSET DATE: 02/23/22  SUBJECTIVE:   SUBJECTIVE STATEMENT: "No pain just stiffness".    PERTINENT HISTORY: Abdominal hernia, smoker, COPD, A-Fib, 01/2015 colon resection  PAIN:  Are you having pain? no Left hip: 0/10   PRECAUTIONS: Anterior hip  WEIGHT BEARING RESTRICTIONS No  FALLS:  Has patient fallen in last 6 months? No  LIVING ENVIRONMENT: Lives with: lives with their family Lives in: House/apartment Stairs: level home, 5 steps to enter Has following equipment at home: Single point cane and Environmental consultant - 4 wheeled  OCCUPATION: retired  PLOF: Independent with basic ADLs  PATIENT GOALS : return to walking and exercise; pt has a international trip coming up in March of 2024 and would like to be able to walk   OBJECTIVE:   DIAGNOSTIC FINDINGS:   IMPRESSION: Intraoperative images during left total hip arthroplasty. Normal alignment.  PATIENT SURVEYS:  FOTO 40 62 @ DC 12 pts MCII  COGNITION:  Overall cognitive status: Within functional limits for tasks assessed     SENSATION: WFL   POSTURE: flexed trunk , weight shift left  PALPATION: No TTP noted around ant or lateral hip  LOWER EXTREMITY ROM:  Passive ROM Right eval Left eval  Hip flexion 120 120 with hernia supported by UE; AROM weakness, unable to clear foot   Hip extension  0  Hip abduction    Hip adduction    Hip internal rotation 20 20  Hip external rotation 45 30 p!  Knee flexion [facility] WFL  Knee extension WFL WFL   (Blank rows = not tested)  LOWER EXTREMITY MMT:  MMT Right eval Left eval  Hip flexion 4+/5 3-/5  Hip extension 4+/5 4-/5  Hip abduction 4+/5 4-/5  Hip adduction 4+/5 4/5  Knee flexion 4+/5 4/5  Knee extension 4+/5 4/5   (Blank rows = not tested)   FUNCTIONAL TESTS:  5 times sit to stand: >15s with UE  supported needed  GAIT: Distance walked: 81f Assistive device utilized: Single point cane and Walker - 4 wheeled Level of assistance: Modified independence Comments: Able to walk safely with SPC. Decreased step length on L; limited extension on L, low foot clearance with L   Gait with Rolator WFL     TODAY'S TREATMENT: Pt seen for aquatic therapy today.  Water 3.25-4 ft in depth . Temp of water was 92.  Pt entered/exited the pool via stairs independently with bilat rail.   Warm up of walking forward/ backward and side stepping holding thick sqoodle - multiple reps Monster walk x 2 widths [place]le walk x 2 widths   Forward step ups  x 12 without ue support leading R/L; SL Left step up side step x12; backward x  10 R/L Cycling  with breast stroke arms forward and back 2 widths ea; scissor and skii 2x20 Standing Hip circles CW/CCW x15 holding to wall Toe raises; heel raises without UE support Hip openers x12 without UE support Stork stance with hip IR/ER x 10 unsupported   Sit to/from stand 3rd step from bottom x5; 4th step x 5 without UE support Adduct set using BB x5 with 10s hold STS from 3rd step with add set (BB) Standing quad and hip flex stretch holding to wall Side lunge holding to wall for adductor stretch 2 x 20s hold Standing hamstring and gastroc stretch on 2 step. Walking between exercises recovery   Pt requires buoyancy for support and to offload joints with strengthening exercises. Viscosity of the water is needed for resistance of strengthening; water current perturbations provides challenge to standing balance unsupported, requiring increased core activation.    PATIENT EDUCATION:  Education details: exercise progression, DOMS expectations Person educated: Patient Education method: Explanation, Demonstration, Tactile cues, Verbal cues, and Handouts Education comprehension: verbalized understanding, returned demonstration, verbal cues required, and tactile cues  required   HOME EXERCISE PROGRAM: Access Code: [email or web address] URL: [email or web address] Date: 04/20/2022 Prepared by: Daleen Bo  ASSESSMENT:  CLINICAL IMPRESSION: Pt with improved strength as she is able to complete 3 standing exercises without ue support nor LOB. Min cues needed for ue relaxation as pt guarded with unsupported exercises. Pt with improving hip rotational strength as evidenced by completion of forward movement with abduction and adduction multiple widths in full range. No pain upon arrival nor increased sx with session. She continues to use rollator for safety with amb. Foto completed scored 49 up from 40 crossing into Stage 3, pt now a community ambulator.  She continues to benefit from skilled physical therapy with decreasing pain and increasing strength and ROM, progressing towards goals.She has hernia surgery scheduled for October.       OBJECTIVE IMPAIRMENTS Abnormal gait, cardiopulmonary status limiting activity, decreased activity tolerance, decreased balance, decreased endurance, decreased mobility, difficulty walking, decreased ROM, decreased strength, hypomobility, increased muscle spasms, impaired flexibility, improper body mechanics, postural dysfunction, and pain.   ACTIVITY LIMITATIONS carrying, lifting, bending, standing, squatting, stairs, transfers, bed mobility, bathing, toileting, dressing, and locomotion level  PARTICIPATION LIMITATIONS: meal prep, cleaning, laundry, interpersonal relationship, driving, shopping, community activity, yard work, and exercise  PERSONAL FACTORS Age, Fitness, Past/current experiences, Time since onset of injury/illness/exacerbation, and 3+ comorbidities:    are also affecting patient's functional outcome.   REHAB POTENTIAL: Good  CLINICAL DECISION MAKING: Evolving/moderate complexity  EVALUATION COMPLEXITY: Moderate   GOALS:   SHORT TERM GOALS: Target date: 06/01/2022  Pt will become independent with HEP in  order to demonstrate synthesis of PT education. .  Goal status: INITIAL  2.  Pt will score at least 12 pt increase on FOTO to demonstrate functional improvement in MCII and pt perceived function.    Goal status: ongoing  3.  Pt will be able to demonstrate normal gait on all surfaces with SPC in order to demonstrate functional improvement in LE function for self-care and house hold duties.   Goal status: INITIAL  LONG TERM GOALS: Target date: 07/13/2022   Pt  will become independent with final HEP in order to demonstrate synthesis of PT education.   Goal status: INITIAL  2.  Pt will be able to demonstrate full reciprocal stair stepping with single UE in order to demonstrate functional improvement in LE function for self-care and house hold duties.  Goal status:  INITIAL  3.  Pt will be able to demonstrate/report ability to walk >20 mins without pain in order to demonstrate functional improvement and tolerance to exercise and community mobility.   Goal status: INITIAL  4.  Pt will score >/= 62 on FOTO to demonstrate improvement in perceived L LE function.   Goal status: INITIAL  5.  Pt will be able to perform 5XSTS in under 12s  in order to demonstrate functional improvement above the cut off score for adults.    Goal status: INITIAL    PLAN: PT FREQUENCY: 1-2x/week  PT DURATION: 12 weeks (likely DC by 10 wks)  PLANNED INTERVENTIONS: Therapeutic exercises, Therapeutic activity, Neuromuscular re-education, Balance training, Gait training, Patient/Family education, Joint manipulation, Joint mobilization, Stair training, Orthotic/Fit training, DME instructions, Aquatic Therapy, Dry Needling, Electrical stimulation, Spinal manipulation, Spinal mobilization, Cryotherapy, Moist heat, Compression bandaging, Taping, Vasopneumatic device, Traction, Ultrasound, Ionotophoresis '4mg'$ /ml Dexamethasone, Manual therapy, and Re-evaluation  PLAN FOR NEXT SESSION:  return to cane?,aquatics,  balance, general hip strength, stair training  Stanton Kidney Tharon Aquas) Assata Juncaj MPT 05/18/22 5:14 PM

## 2022-05-18 NOTE — Telephone Encounter (Signed)
Yes I recommend she get the Covid booster because she has asthma, which is a risk factor

## 2022-05-20 ENCOUNTER — Encounter: Payer: Self-pay | Admitting: Gastroenterology

## 2022-05-24 ENCOUNTER — Encounter: Payer: Self-pay | Admitting: Gastroenterology

## 2022-05-24 ENCOUNTER — Encounter: Payer: BC Managed Care – PPO | Admitting: Gastroenterology

## 2022-05-25 ENCOUNTER — Encounter: Payer: Self-pay | Admitting: Family Medicine

## 2022-05-26 ENCOUNTER — Ambulatory Visit (HOSPITAL_BASED_OUTPATIENT_CLINIC_OR_DEPARTMENT_OTHER): Payer: BC Managed Care – PPO | Attending: Orthopedic Surgery | Admitting: Physical Therapy

## 2022-05-26 ENCOUNTER — Encounter (HOSPITAL_BASED_OUTPATIENT_CLINIC_OR_DEPARTMENT_OTHER): Payer: Self-pay | Admitting: Physical Therapy

## 2022-05-26 DIAGNOSIS — M6281 Muscle weakness (generalized): Secondary | ICD-10-CM | POA: Diagnosis present

## 2022-05-26 DIAGNOSIS — M25552 Pain in left hip: Secondary | ICD-10-CM | POA: Insufficient documentation

## 2022-05-26 DIAGNOSIS — M25652 Stiffness of left hip, not elsewhere classified: Secondary | ICD-10-CM | POA: Diagnosis present

## 2022-05-26 DIAGNOSIS — R262 Difficulty in walking, not elsewhere classified: Secondary | ICD-10-CM | POA: Diagnosis present

## 2022-05-26 NOTE — Therapy (Signed)
OUTPATIENT PHYSICAL THERAPY LOWER EXTREMITY TREATMENT NOTE   Patient Name: Miyu Fenderson MRN: [id number] DOB:1955-11-03, 66 y.o., female Today's Date: 05/26/2022   PT End of Session - 05/26/22 1333     Visit Number 7    Number of Visits 19    Date for PT Re-Evaluation 07/19/22    Authorization Type BCBS    PT Start Time 1330    PT Stop Time 7124    PT Time Calculation (min) 45 min    Activity Tolerance Patient tolerated treatment well    Behavior During Therapy [facility] for tasks assessed/performed               Past Medical History:  Diagnosis Date   Anal fissure    Arthritis    Bursitis    Constipation    COPD (chronic obstructive pulmonary disease) ([place])    Diverticulitis of colon 02/2015   Ectopic pregnancy    Emphysema of lung ([place])    Frozen shoulder    Heart murmur    Hernia, abdominal    Hyperlipidemia    Hyperthyroidism    Infertility, female    Irregular heartbeat    Joint pain    Obesity    Osteoarthritis    Pneumonia    Pre-diabetes    Sleep apnea    Swallowing difficulty    Thyroid disease    hyperthyroid   Past Surgical History:  Procedure Laterality Date   APPENDECTOMY     CESAREAN SECTION     COLONOSCOPY  09/10/2010   per Dr. Acquanetta Sit, diverticulosis of descending colon and sigmoid, internal hemorrhoids, repeat in 10 yrs   COLOSTOMY N/A 02/20/2015   Procedure: COLOSTOMY;  Surgeon: Jackolyn Confer, MD;  Location: [facility];  Service: General;  Laterality: N/A;   COLOSTOMY REVISION  02/20/2015   Procedure: Sigmoid Colectomy;  Surgeon: Jackolyn Confer, MD;  Location: [facility];  Service: General;;   CYSTOSCOPY N/A 02/20/2015   Procedure: Consuela Mimes;  Surgeon: Kathie Rhodes, MD;  [facility];  Service: Urology;  Laterality: N/A;   DILATION AND CURETTAGE OF UTERUS     x3   ECTOPIC PREGNANCY SURGERY     ILEO LOOP COLOSTOMY CLOSURE N/A 09/26/2015   Procedure: LAPAROSCOPIC LYSIS OF ADHESIONS, COLOSTOMY CLOSURE;  Surgeon: Jackolyn Confer, MD;  [facility];  Service: General;  Laterality: N/A;   LAPAROTOMY N/A 02/20/2015   Procedure: Emergency EXPLORATORY and drainiage of interabdominal abcesses;  Surgeon: Jackolyn Confer, MD;  [facility];  Service: General;  Laterality: N/A;   SALPINGOOPHORECTOMY Left 02/20/2015   Procedure: SALPINGO OOPHORECTOMY;  Surgeon: Jackolyn Confer, MD;  [facility];  Service: General;  Laterality: Left;   [place] Left 02/23/2022   Procedure: TOTAL HIP ARTHROPLASTY ANTERIOR APPROACH;  Surgeon: Paralee Cancel, MD;  [facility];  Service: Orthopedics;  Laterality: Left;   Patient Active Problem List   Diagnosis Date Noted   Vitamin D deficiency 03/26/2022   S/P total left hip arthroplasty 02/23/2022   Adjustment disorder with depressed mood 11/18/2021   COVID-19 virus infection 07/07/2021   Psoriasis 01/15/2021   Chronic pain of left knee 01/15/2021   Ventral hernia without obstruction or gangrene 01/15/2021   Class 3 severe obesity with serious comorbidity and body mass index (BMI) of 40.0 to 44.9 in adult [facility]) 01/15/2019   Acne rosacea 05/10/2016   Multinodular goiter 02/27/2016   Subclinical thyrotoxicosis 02/26/2016   Colostomy in place [facility]) s/p colostomy reversal  09/26/15 09/26/2015   Prediabetes 07/30/2015   Perforation of sigmoid colon ([place]) 02/20/2015   Diverticulitis of large intestine with abscess without bleeding 02/18/2015   OSA (obstructive sleep apnea) 05/09/2013   GASTROENTERITIS 08/18/2010   RAYNAUD'S SYNDROME 02/17/2009   PLANTAR FASCIITIS 02/17/2009   Asthma 06/12/2008   Hyperlipidemia 09/04/2007   DEVIATED SEPTUM 09/04/2007   ALLERGIC RHINITIS 09/04/2007   GERD 09/04/2007   OSTEOARTHRITIS 09/04/2007   HEADACHE 09/04/2007    PCP:   Laurey Morale, MD    REFERRING PROVIDER: Paralee Cancel, MD  REFERRING DIAG: Z51.89 (ICD-10-CM) - Encounter for other specified aftercare  THERAPY DIAG:  Stiffness of  left hip, not elsewhere classified  Pain in left hip  Muscle weakness (generalized)  Rationale for Evaluation and Treatment Rehabilitation  ONSET DATE: 02/23/22  SUBJECTIVE:   SUBJECTIVE STATEMENT: "Left shoulder 2/10 after shot Friday, hip maybe 1/10.  Hip is feeling stronger"    PERTINENT HISTORY: Abdominal hernia, smoker, COPD, A-Fib, 01/2015 colon resection  PAIN:  Are you having pain? no Left hip: 0/10   PRECAUTIONS: Anterior hip  WEIGHT BEARING RESTRICTIONS No  FALLS:  Has patient fallen in last 6 months? No  LIVING ENVIRONMENT: Lives with: lives with their family Lives in: House/apartment Stairs: level home, 5 steps to enter Has following equipment at home: Single point cane and Environmental consultant - 4 wheeled  OCCUPATION: retired  PLOF: Independent with basic ADLs  PATIENT GOALS : return to walking and exercise; pt has a international trip coming up in March of 2024 and would like to be able to walk   OBJECTIVE:   DIAGNOSTIC FINDINGS:   IMPRESSION: Intraoperative images during left total hip arthroplasty. Normal alignment.  PATIENT SURVEYS:  FOTO 40 62 @ DC 12 pts MCII  COGNITION:  Overall cognitive status: Within functional limits for tasks assessed     SENSATION: WFL   POSTURE: flexed trunk , weight shift left  PALPATION: No TTP noted around ant or lateral hip  LOWER EXTREMITY ROM:  Passive ROM Right eval Left eval  Hip flexion 120 120 with hernia supported by UE; AROM weakness, unable to clear foot   Hip extension  0  Hip abduction    Hip adduction    Hip internal rotation 20 20  Hip external rotation 45 30 p!  Knee flexion [facility] WFL  Knee extension WFL WFL   (Blank rows = not tested)  LOWER EXTREMITY MMT:  MMT Right eval Left eval  Hip flexion 4+/5 3-/5  Hip extension 4+/5 4-/5  Hip abduction 4+/5 4-/5  Hip adduction 4+/5 4/5  Knee flexion 4+/5 4/5  Knee extension 4+/5 4/5   (Blank rows = not tested)   FUNCTIONAL TESTS:  5  times sit to stand: >15s with UE supported needed  GAIT: Distance walked: 84f Assistive device utilized: Single point cane and Walker - 4 wheeled Level of assistance: Modified independence Comments: Able to walk safely with SPC. Decreased step length on L; limited extension on L, low foot clearance with L   Gait with Rolator WFL     TODAY'S TREATMENT: Pt seen for aquatic therapy today.  Water 3.25-4 ft in depth . Temp of water was 92.  Pt entered/exited the pool via stairs independently with bilat rail.   Warm up of walking forward/ backward and side stepping holding thick sqoodle - multiple reps Monster walk x 4 widths Hurdle walk x 4 widths Side lunges, ue add/abd yellow hand buoys x 4 widths   Forward step ups  x 12 without ue support leading R/L; SL Left step up side step 2x12; backward x 12 R/L Cycling  with breast stroke arms forward and back 2 widths ea; scissor and skii 2x20  *Sit to/from stand  4th step x 10 without UE support Adduct set using BB x5 with 10s hold STS from 3rd step with add set (BB) x5 Standing quad and hip flex stretch left foot on 2nd step Side lunge holding to wall for adductor stretch 2 x 20s hold Standing hamstring and gastroc stretch on 2 step. Walking between exercises recovery   Pt requires buoyancy for support and to offload joints with strengthening exercises. Viscosity of the water is needed for resistance of strengthening; water current perturbations provides challenge to standing balance unsupported, requiring increased core activation.    PATIENT EDUCATION:  Education details: exercise progression, DOMS expectations Person educated: Patient Education method: Explanation, Demonstration, Tactile cues, Verbal cues, and Handouts Education comprehension: verbalized understanding, returned demonstration, verbal cues required, and tactile cues required   HOME EXERCISE PROGRAM: Access Code: [email or web address] URL:  [email or web address] Date: 04/20/2022 Prepared by: Daleen Bo  ASSESSMENT:  CLINICAL IMPRESSION:  Md appointment  lat week.  She does not return x 1 yr.  New order for left shoulder pain in which she had cortizone shot.  Pt instructed to start amb to and from pool using cane to progress toleration and strength. Gait wfl submerged: equal time in stance R/L, heel strike and toe off with each step. Minor cues for TKE with step ups.  She does report feeling fatigued in left quad upon completion. Reports compliance with HEP. Goals ongoing    OBJECTIVE IMPAIRMENTS Abnormal gait, cardiopulmonary status limiting activity, decreased activity tolerance, decreased balance, decreased endurance, decreased mobility, difficulty walking, decreased ROM, decreased strength, hypomobility, increased muscle spasms, impaired flexibility, improper body mechanics, postural dysfunction, and pain.   ACTIVITY LIMITATIONS carrying, lifting, bending, standing, squatting, stairs, transfers, bed mobility, bathing, toileting, dressing, and locomotion level  PARTICIPATION LIMITATIONS: meal prep, cleaning, laundry, interpersonal relationship, driving, shopping, community activity, yard work, and exercise  PERSONAL FACTORS Age, Fitness, Past/current experiences, Time since onset of injury/illness/exacerbation, and 3+ comorbidities:    are also affecting patient's functional outcome.   REHAB POTENTIAL: Good  CLINICAL DECISION MAKING: Evolving/moderate complexity  EVALUATION COMPLEXITY: Moderate   GOALS:   SHORT TERM GOALS: Target date: 06/01/2022  Pt will become independent with HEP in order to demonstrate synthesis of PT education. .  Goal status: Achieved  2.  Pt will score at least 12 pt increase on FOTO to demonstrate functional improvement in MCII and pt perceived function.    Goal status: ongoing  3.  Pt will be able to demonstrate normal gait on all surfaces with SPC in order to demonstrate  functional improvement in LE function for self-care and house hold duties.   Goal status: INITIAL  LONG TERM GOALS: Target date: 07/13/2022   Pt  will become independent with final HEP in order to demonstrate synthesis of PT education.   Goal status: INITIAL  2.  Pt will be able to demonstrate full reciprocal stair stepping with single UE in order to demonstrate functional improvement in LE function for self-care and house hold duties.  Goal status: INITIAL  3.  Pt will be able to demonstrate/report ability to walk >20 mins without pain in order to demonstrate functional improvement and tolerance to exercise and community mobility.   Goal status: INITIAL  4.  Pt will score >/= 62 on FOTO to  demonstrate improvement in perceived L LE function.   Goal status: INITIAL  5.  Pt will be able to perform 5XSTS in under 12s  in order to demonstrate functional improvement above the cut off score for adults.    Goal status: INITIAL    PLAN: PT FREQUENCY: 1-2x/week  PT DURATION: 12 weeks (likely DC by 10 wks)  PLANNED INTERVENTIONS: Therapeutic exercises, Therapeutic activity, Neuromuscular re-education, Balance training, Gait training, Patient/Family education, Joint manipulation, Joint mobilization, Stair training, Orthotic/Fit training, DME instructions, Aquatic Therapy, Dry Needling, Electrical stimulation, Spinal manipulation, Spinal mobilization, Cryotherapy, Moist heat, Compression bandaging, Taping, Vasopneumatic device, Traction, Ultrasound, Ionotophoresis '4mg'$ /ml Dexamethasone, Manual therapy, and Re-evaluation  PLAN FOR NEXT SESSION:  return to cane,aquatics, balance, general hip strength, stair training  Stanton Kidney Tharon Aquas) Jemaine Prokop MPT 05/26/22 1:40 PM

## 2022-05-27 ENCOUNTER — Ambulatory Visit (AMBULATORY_SURGERY_CENTER): Payer: BC Managed Care – PPO | Admitting: Gastroenterology

## 2022-05-27 ENCOUNTER — Encounter: Payer: Self-pay | Admitting: Gastroenterology

## 2022-05-27 VITALS — BP 108/66 | HR 60 | Temp 98.2°F | Resp 16 | Ht 60.0 in | Wt 200.0 lb

## 2022-05-27 DIAGNOSIS — Z1211 Encounter for screening for malignant neoplasm of colon: Secondary | ICD-10-CM | POA: Diagnosis not present

## 2022-05-27 HISTORY — PX: COLONOSCOPY: SHX174

## 2022-05-27 MED ORDER — SODIUM CHLORIDE 0.9 % IV SOLN: 500.0000 mL | Freq: Once | INTRAVENOUS | Status: DC

## 2022-05-27 NOTE — Progress Notes (Signed)
[facility] History and Physical   Primary Care Physician:  Laurey Morale, MD   Reason for Procedure:  Colorectal cancer screening  Plan:    Screening colonoscopy with possible interventions as needed     HPI: Alison Best is a very pleasant 66 y.o. female here for screening colonoscopy. Denies any nausea, vomiting, abdominal pain, melena or bright red blood per rectum  The risks and benefits as well as alternatives of endoscopic procedure(s) have been discussed and reviewed. All questions answered. The patient agrees to proceed.    Past Medical History:  Diagnosis Date   Anal fissure    Arthritis    Bursitis    Constipation    COPD (chronic obstructive pulmonary disease) ([place])    Diverticulitis of colon 02/2015   Ectopic pregnancy    Emphysema of lung ([facility])    Frozen shoulder    Heart murmur    Hernia, abdominal    Hyperlipidemia    Hyperthyroidism    Infertility, female    Irregular heartbeat    Joint pain    Obesity    Osteoarthritis    Pneumonia    Pre-diabetes    Sleep apnea    Swallowing difficulty    Thyroid disease    hyperthyroid    Past Surgical History:  Procedure Laterality Date   APPENDECTOMY     CESAREAN SECTION     COLONOSCOPY  09/10/2010   per Dr. Acquanetta Sit, diverticulosis of descending colon and sigmoid, internal hemorrhoids, repeat in 10 yrs   COLOSTOMY N/A 02/20/2015   Procedure: COLOSTOMY;  Surgeon: Jackolyn Confer, MD;  [facility];  Service: General;  Laterality: N/A;   COLOSTOMY REVISION  02/20/2015   Procedure: Sigmoid Colectomy;  Surgeon: Jackolyn Confer, MD;  [facility];  Service: General;;   CYSTOSCOPY N/A 02/20/2015   Procedure: Consuela Mimes;  Surgeon: Kathie Rhodes, MD;  [facility];  Service: Urology;  Laterality: N/A;   DILATION AND CURETTAGE OF UTERUS     x3   ECTOPIC PREGNANCY SURGERY     ILEO LOOP COLOSTOMY CLOSURE N/A 09/26/2015   Procedure: LAPAROSCOPIC LYSIS OF ADHESIONS,  COLOSTOMY CLOSURE;  Surgeon: Jackolyn Confer, MD;  [facility];  Service: General;  Laterality: N/A;   LAPAROTOMY N/A 02/20/2015   Procedure: Emergency EXPLORATORY and drainiage of interabdominal abcesses;  Surgeon: Jackolyn Confer, MD;  [facility];  Service: General;  Laterality: N/A;   SALPINGOOPHORECTOMY Left 02/20/2015   Procedure: SALPINGO OOPHORECTOMY;  Surgeon: Jackolyn Confer, MD;  [facility];  Service: General;  Laterality: Left;   [place] Left 02/23/2022   Procedure: TOTAL HIP ARTHROPLASTY ANTERIOR APPROACH;  Surgeon: Paralee Cancel, MD;  [facility];  Service: Orthopedics;  Laterality: Left;    Prior to Admission medications   Medication Sig Start Date End Date Taking? Authorizing Provider  atorvastatin (LIPITOR) 20 MG tablet TAKE 1 TABLET(20 MG) BY MOUTH DAILY 01/25/22  Yes Philemon Kingdom, MD  betamethasone dipropionate 0.05 % cream Apply 1 application. topically 2 (two) times daily as needed (eczema). 03/26/22  Yes Laurey Morale, MD  celecoxib (CELEBREX) 200 MG capsule Take 1 capsule (200 mg total) by mouth 2 (two) times daily. 02/24/22  Yes Irving Copas, PA-C  Coenzyme Q10 (CO Q 10 PO) Take 200 mg by mouth daily.   Yes [provider]  diltiazem (CARDIZEM CD) 120 MG 24 hr capsule Take 1 capsule (120 mg total) by mouth daily. 11/23/21  Yes Burnell Blanks, MD  fenofibrate micronized (LOFIBRA) 134 MG capsule Take 1 capsule (134 mg total) by mouth daily. 04/05/22  Yes Laurey Morale, MD  metFORMIN (GLUCOPHAGE) 500 MG tablet TAKE 1 TABLET(500 MG) BY MOUTH TWICE DAILY 02/02/22  Yes Philemon Kingdom, MD  Multiple Vitamins-Minerals (MULTIVITAMIN/EXTRA VITAMIN D3 PO) Take 1 tablet by mouth daily.   Yes [provider]  Omega-3 Fatty Acids (FISH OIL PO) Take by mouth.   Yes [provider]  OVER THE COUNTER MEDICATION Take 1 tablet by mouth daily. Saffron   Yes [provider]  OVER THE  COUNTER MEDICATION Take 320 mg by mouth daily. phytoceramides   Yes [provider]  polyethylene glycol (MIRALAX / GLYCOLAX) 17 g packet Take 17 g by mouth daily as needed for mild constipation. 02/24/22  Yes Irving Copas, PA-C  Turmeric 400 MG CAPS Take 400 mg by mouth daily.   Yes [provider]  acetaZOLAMIDE ER (DIAMOX) 500 MG capsule Take 1 capsule (500 mg total) by mouth 2 (two) times daily. When travelling at high altitudes Patient not taking: Reported on 04/05/2022 06/05/21   Laurey Morale, MD  COLLAGEN PO Take 15 mLs by mouth daily. Liquid    [provider]  diclofenac (VOLTAREN) 75 MG EC tablet TAKE 1 TABLET(75 MG) BY MOUTH TWICE DAILY Patient not taking: Reported on 05/27/2022 04/05/22   Laurey Morale, MD  docusate sodium (COLACE) 100 MG capsule Take 1 capsule (100 mg total) by mouth 2 (two) times daily. Patient not taking: Reported on 05/27/2022 02/24/22   Irving Copas, PA-C  furosemide (LASIX) 20 MG tablet Take 1 tablet (20 mg total) by mouth daily as needed for fluid or edema. 04/05/22   Laurey Morale, MD  ketoconazole (NIZORAL) 2 % cream Apply 1 application. topically 2 (two) times daily as needed for irritation (fungal infections). 03/26/22   Laurey Morale, MD  mupirocin ointment Drue Stager) 2 % Apply inside both nostrils twice a day 03/26/22   Laurey Morale, MD  OVER THE COUNTER MEDICATION Take 2 capsules by mouth daily. Memory and Brain    [provider]  OVER THE COUNTER MEDICATION Take 1 capsule by mouth daily. Cruciferous extract    [provider]  OVER THE COUNTER MEDICATION Take 1 capsule by mouth daily. fruit full antioxidant    [provider]  UNABLE TO FIND Med Name: vegetable blend    [provider]    Current Outpatient Medications  Medication Sig Dispense Refill   atorvastatin (LIPITOR) 20 MG tablet TAKE 1 TABLET(20 MG) BY MOUTH DAILY 90 tablet 3   betamethasone dipropionate 0.05 % cream Apply 1  application. topically 2 (two) times daily as needed (eczema). 45 g 5   celecoxib (CELEBREX) 200 MG capsule Take 1 capsule (200 mg total) by mouth 2 (two) times daily. 60 capsule 0   Coenzyme Q10 (CO Q 10 PO) Take 200 mg by mouth daily.     diltiazem (CARDIZEM CD) 120 MG 24 hr capsule Take 1 capsule (120 mg total) by mouth daily. 90 capsule 3   fenofibrate micronized (LOFIBRA) 134 MG capsule Take 1 capsule (134 mg total) by mouth daily. 90 capsule 1   metFORMIN (GLUCOPHAGE) 500 MG tablet TAKE 1 TABLET(500 MG) BY MOUTH TWICE DAILY 180 tablet 3   Multiple Vitamins-Minerals (MULTIVITAMIN/EXTRA VITAMIN D3 PO) Take 1 tablet by mouth daily.     Omega-3 Fatty Acids (FISH OIL PO) Take by mouth.  OVER THE COUNTER MEDICATION Take 1 tablet by mouth daily. Saffron     OVER THE COUNTER MEDICATION Take 320 mg by mouth daily. phytoceramides     polyethylene glycol (MIRALAX / GLYCOLAX) 17 g packet Take 17 g by mouth daily as needed for mild constipation. 14 each 0   Turmeric 400 MG CAPS Take 400 mg by mouth daily.     acetaZOLAMIDE ER (DIAMOX) 500 MG capsule Take 1 capsule (500 mg total) by mouth 2 (two) times daily. When travelling at high altitudes (Patient not taking: Reported on 04/05/2022) 60 capsule 2   COLLAGEN PO Take 15 mLs by mouth daily. Liquid     diclofenac (VOLTAREN) 75 MG EC tablet TAKE 1 TABLET(75 MG) BY MOUTH TWICE DAILY (Patient not taking: Reported on 05/27/2022) 180 tablet 3   docusate sodium (COLACE) 100 MG capsule Take 1 capsule (100 mg total) by mouth 2 (two) times daily. (Patient not taking: Reported on 05/27/2022) 10 capsule 0   furosemide (LASIX) 20 MG tablet Take 1 tablet (20 mg total) by mouth daily as needed for fluid or edema. 30 tablet 11   ketoconazole (NIZORAL) 2 % cream Apply 1 application. topically 2 (two) times daily as needed for irritation (fungal infections). 30 g 5   mupirocin ointment (BACTROBAN) 2 % Apply inside both nostrils twice a day 30 g 5   OVER THE COUNTER  MEDICATION Take 2 capsules by mouth daily. Memory and Brain     OVER THE COUNTER MEDICATION Take 1 capsule by mouth daily. Cruciferous extract     OVER THE COUNTER MEDICATION Take 1 capsule by mouth daily. fruit full antioxidant     UNABLE TO FIND Med Name: vegetable blend     Current Facility-Administered Medications  Medication Dose Route Frequency Provider Last Rate Last Admin   0.9 %  sodium chloride infusion  500 mL Intravenous Once Grete Bosko, Venia Minks, MD        Allergies as of 05/27/2022 - Review Complete 05/27/2022  Allergen Reaction Noted   Levofloxacin in d5w Other (See Comments) 06/09/2012   Mango flavor Swelling 07/26/2017   Bupropion Nausea And Vomiting 09/22/2015    Family History  Problem Relation Age of Onset   Breast cancer Mother    High blood pressure Mother    Kidney disease Mother    Sleep apnea Mother    Obesity Mother    CAD Father        CABG   Skin cancer Father    Lung cancer Father    Diverticulitis Father    Colon polyps Father    High blood pressure Father    Stroke Father    Heart disease Father    Sleep apnea Father    Allergies Brother    Diabetes Brother    Breast cancer Maternal Grandmother    Heart attack Maternal Grandfather    Heart attack Paternal Grandfather    COPD Other    Cancer Other    Heart disease Other    Stroke Other     Social History   Socioeconomic History   Marital status: Married    Spouse name: Not on file   Number of children: 1   Years of education: Not on file   Highest education level: Not on file  Occupational History   Occupation: Equities trader at [employer]    Employer: [employer] Use   Smoking status: Every Day    Packs/day: 0.25    Years:  40.00    Total pack years: 10.00    Types: Cigarettes   Smokeless tobacco: Never  Vaping Use   Vaping Use: Never used  Substance and Sexual Activity   Alcohol use: Not Currently   Drug use: No   Sexual activity: Not  Currently    Partners: Male    Birth control/protection: Post-menopausal    Comment: 1ST intercourse- 34, partners- 3  Other Topics Concern   Not on file  Social History Narrative   Not on file   Social Determinants of Health   Financial Resource Strain: Not on file  Food Insecurity: Not on file  Transportation Needs: Not on file  Physical Activity: Not on file  Stress: Not on file  Social Connections: Not on file  Intimate Partner Violence: Not on file    Review of Systems:  All other review of systems negative except as mentioned in the HPI.  Physical Exam: Vital signs in last 24 hours: BP 122/68   Pulse 67   Temp 98.2 F (36.8 C)   Ht 5' (1.524 m)   Wt 200 lb (90.7 kg)   LMP 02/23/2012   SpO2 96%   BMI 39.06 kg/m  General:   Alert, NAD Lungs:  Clear .   Heart:  Regular rate and rhythm Abdomen:  Soft, nontender and nondistended. Neuro/Psych:  Alert and cooperative. Normal mood and affect. A and O x 3  Reviewed labs, radiology imaging, old records and pertinent past GI work up  Patient is appropriate for planned procedure(s) and anesthesia in an ambulatory setting   K. Denzil Magnuson , MD [phone]

## 2022-05-27 NOTE — Patient Instructions (Signed)
Read all of the handouts given to you by your recovery room nurse.  YOU HAD AN ENDOSCOPIC PROCEDURE TODAY AT [facility]:   Refer to the procedure report that was given to you for any specific questions about what was found during the examination.  If the procedure report does not answer your questions, please call your gastroenterologist to clarify.  If you requested that your care partner not be given the details of your procedure findings, then the procedure report has been included in a sealed envelope for you to review at your convenience later.  YOU SHOULD EXPECT: Some feelings of bloating in the abdomen. Passage of more gas than usual.  Walking can help get rid of the air that was put into your GI tract during the procedure and reduce the bloating. If you had a lower endoscopy (such as a colonoscopy or flexible sigmoidoscopy) you may notice spotting of blood in your stool or on the toilet paper. If you underwent a bowel prep for your procedure, you may not have a normal bowel movement for a few days.  Please Note:  You might notice some irritation and congestion in your nose or some drainage.  This is from the oxygen used during your procedure.  There is no need for concern and it should clear up in a day or so.  SYMPTOMS TO REPORT IMMEDIATELY:  Following lower endoscopy (colonoscopy or flexible sigmoidoscopy):  Excessive amounts of blood in the stool  Significant tenderness or worsening of abdominal pains  Swelling of the abdomen that is new, acute  Fever of 100F or higher   For urgent or emergent issues, a gastroenterologist can be reached at any hour by calling [phone]. Do not use MyChart messaging for urgent concerns.    DIET:  We do recommend a small meal at first, but then you may proceed to your regular diet.  Drink plenty of fluids but you should avoid alcoholic beverages for 24 hours.  ACTIVITY:  You should plan to take it easy for the rest of today and  you should NOT DRIVE or use heavy machinery until tomorrow (because of the sedation medicines used during the test).    FOLLOW UP: Our staff will call the number listed on your records the next business day following your procedure.  We will call around 7:15- 8:00 am to check on you and address any questions or concerns that you may have regarding the information given to you following your procedure. If we do not reach you, we will leave a message.  If you develop any symptoms (ie: fever, flu-like symptoms, shortness of breath, cough etc.) before then, please call [phone].  If you test positive for Covid 19 in the 2 weeks post procedure, please call and report this information to [name].      SIGNATURES/CONFIDENTIALITY: You and/or your care partner have signed paperwork which will be entered into your electronic medical record.  These signatures attest to the fact that that the information above on your After Visit Summary has been reviewed and is understood.  Full responsibility of the confidentiality of this discharge information lies with you and/or your care-partner.

## 2022-05-27 NOTE — Op Note (Signed)
[facility] Patient Name: Alison Best Procedure Date: 05/27/2022 9:34 AM MRN: [id number] Endoscopist: Mauri Pole , MD Age: 66 Referring MD:  Date of Birth: 1955/11/09 Gender: Female Account #: [id number] Procedure:                Colonoscopy Indications:              Screening for colorectal malignant neoplasm Medicines:                Monitored Anesthesia Care Procedure:                Pre-Anesthesia Assessment:                           - Prior to the procedure, a History and Physical                            was performed, and patient medications and                            allergies were reviewed. The patient's tolerance of                            previous anesthesia was also reviewed. The risks                            and benefits of the procedure and the sedation                            options and risks were discussed with the patient.                            All questions were answered, and informed consent                            was obtained. Prior Anticoagulants: The patient has                            taken no previous anticoagulant or antiplatelet                            agents. ASA Grade Assessment: III - A patient with                            severe systemic disease. After reviewing the risks                            and benefits, the patient was deemed in                            satisfactory condition to undergo the procedure.                           After obtaining informed consent, the colonoscope  was passed under direct vision. Throughout the                            procedure, the patient's blood pressure, pulse, and                            oxygen saturations were monitored continuously. The                            Olympus PCF-H190DL (#[id number]) Colonoscope was                            introduced through the anus and advanced to the the                            cecum,  identified by appendiceal orifice and                            ileocecal valve. The colonoscopy was performed                            without difficulty. The patient tolerated the                            procedure well. The quality of the bowel                            preparation was good. The ileocecal valve,                            appendiceal orifice, and rectum were photographed. Scope In: 9:46:52 AM Scope Out: 10:01:59 AM Scope Withdrawal Time: 0 hours 7 minutes 50 seconds  Total Procedure Duration: 0 hours 15 minutes 7 seconds  Findings:                 The perianal and digital rectal examinations were                            normal.                           A single large-mouthed diverticulum was found in                            the recto-sigmoid colon.                           Non-bleeding external and internal hemorrhoids were                            found during retroflexion. The hemorrhoids were                            medium-sized. Complications:            No immediate complications. Estimated Blood Loss:  Estimated blood loss was minimal. Impression:               - Diverticulosis in the recto-sigmoid colon.                           - Non-bleeding external and internal hemorrhoids.                           - No specimens collected. Recommendation:           - Patient has a contact number available for                            emergencies. The signs and symptoms of potential                            delayed complications were discussed with the                            patient. Return to normal activities tomorrow.                            Written discharge instructions were provided to the                            patient.                           - Resume previous diet.                           - Continue present medications.                           - Repeat colonoscopy in 10 years for surveillance. Mauri Pole,  MD 05/27/2022 10:15:54 AM This report has been signed electronically.

## 2022-05-27 NOTE — Progress Notes (Signed)
A and O x3. Report to RN. Tolerated MAC anesthesia well. 

## 2022-05-28 ENCOUNTER — Ambulatory Visit (HOSPITAL_BASED_OUTPATIENT_CLINIC_OR_DEPARTMENT_OTHER): Payer: Self-pay | Admitting: Physical Therapy

## 2022-05-28 ENCOUNTER — Telehealth: Payer: Self-pay | Admitting: *Deleted

## 2022-05-28 MED ORDER — NIRMATRELVIR/RITONAVIR (PAXLOVID)TABLET: 3.0000 | ORAL_TABLET | Freq: Two times a day (BID) | ORAL | 0 refills | Status: AC

## 2022-05-28 NOTE — Telephone Encounter (Signed)
Done

## 2022-05-28 NOTE — Telephone Encounter (Signed)
  Follow up Call-     05/27/2022    9:00 AM  Call back number  Post procedure Call Back phone  # [phone]  Permission to leave phone message Yes     Patient questions:  Do you have a fever, pain , or abdominal swelling? No. Pain Score  0 *  Have you tolerated food without any problems? Yes.    Have you been able to return to your normal activities? Yes.    Do you have any questions about your discharge instructions: Diet   No. Medications  No. Follow up visit  No.  Do you have questions or concerns about your Care? No.  Actions: * If pain score is 4 or above: No action needed, pain <4.

## 2022-06-02 ENCOUNTER — Encounter (INDEPENDENT_AMBULATORY_CARE_PROVIDER_SITE_OTHER): Payer: Self-pay

## 2022-06-09 ENCOUNTER — Ambulatory Visit (HOSPITAL_BASED_OUTPATIENT_CLINIC_OR_DEPARTMENT_OTHER): Payer: BC Managed Care – PPO | Admitting: Physical Therapy

## 2022-06-09 ENCOUNTER — Encounter (HOSPITAL_BASED_OUTPATIENT_CLINIC_OR_DEPARTMENT_OTHER): Payer: Self-pay | Admitting: Physical Therapy

## 2022-06-09 DIAGNOSIS — R262 Difficulty in walking, not elsewhere classified: Secondary | ICD-10-CM

## 2022-06-09 DIAGNOSIS — M6281 Muscle weakness (generalized): Secondary | ICD-10-CM

## 2022-06-09 DIAGNOSIS — M25652 Stiffness of left hip, not elsewhere classified: Secondary | ICD-10-CM | POA: Diagnosis not present

## 2022-06-09 DIAGNOSIS — M25552 Pain in left hip: Secondary | ICD-10-CM

## 2022-06-09 NOTE — Therapy (Signed)
OUTPATIENT PHYSICAL THERAPY LOWER EXTREMITY TREATMENT NOTE   Patient Name: Alison Best MRN: [id number] DOB:August 29, 1956, 66 y.o., female Today's Date: 06/09/2022   PT End of Session - 06/09/22 0922     Visit Number 8    Number of Visits 19    Date for PT Re-Evaluation 07/19/22    Authorization Type BCBS    PT Start Time 0905    PT Stop Time 0945    PT Time Calculation (min) 40 min    Activity Tolerance Patient tolerated treatment well    Behavior During Therapy [facility] for tasks assessed/performed                Past Medical History:  Diagnosis Date   Anal fissure    Arthritis    Bursitis    Constipation    COPD (chronic obstructive pulmonary disease) ([place])    Diverticulitis of colon 02/2015   Ectopic pregnancy    Emphysema of lung ([place])    Frozen shoulder    Heart murmur    Hernia, abdominal    Hyperlipidemia    Hyperthyroidism    Infertility, female    Irregular heartbeat    Joint pain    Obesity    Osteoarthritis    Pneumonia    Pre-diabetes    Sleep apnea    Swallowing difficulty    Thyroid disease    hyperthyroid   Past Surgical History:  Procedure Laterality Date   APPENDECTOMY     CESAREAN SECTION     COLONOSCOPY  09/10/2010   per Dr. Acquanetta Sit, diverticulosis of descending colon and sigmoid, internal hemorrhoids, repeat in 10 yrs   COLOSTOMY N/A 02/20/2015   Procedure: COLOSTOMY;  Surgeon: Jackolyn Confer, MD;  Location: [facility];  Service: General;  Laterality: N/A;   COLOSTOMY REVISION  02/20/2015   Procedure: Sigmoid Colectomy;  Surgeon: Jackolyn Confer, MD;  Location: [facility];  Service: General;;   CYSTOSCOPY N/A 02/20/2015   Procedure: Consuela Mimes;  Surgeon: Kathie Rhodes, MD;  Location: [facility];  Service: Urology;  Laterality: N/A;   DILATION AND CURETTAGE OF UTERUS     x3   ECTOPIC PREGNANCY SURGERY     ILEO LOOP COLOSTOMY CLOSURE N/A 09/26/2015   Procedure: LAPAROSCOPIC LYSIS OF ADHESIONS, COLOSTOMY CLOSURE;  Surgeon: Jackolyn Confer, MD;  Location: [facility];  Service: General;  Laterality: N/A;   LAPAROTOMY N/A 02/20/2015   Procedure: Emergency EXPLORATORY and drainiage of interabdominal abcesses;  Surgeon: Jackolyn Confer, MD;  Location: [facility];  Service: General;  Laterality: N/A;   SALPINGOOPHORECTOMY Left 02/20/2015   Procedure: SALPINGO OOPHORECTOMY;  Surgeon: Jackolyn Confer, MD;  Location: [facility];  Service: General;  Laterality: Left;   [place] Left 02/23/2022   Procedure: TOTAL HIP ARTHROPLASTY ANTERIOR APPROACH;  Surgeon: Paralee Cancel, MD;  Location: [facility];  Service: Orthopedics;  Laterality: Left;   Patient Active Problem List   Diagnosis Date Noted   Vitamin D deficiency 03/26/2022   S/P total left hip arthroplasty 02/23/2022   Adjustment disorder with depressed mood 11/18/2021   COVID-19 virus infection 07/07/2021   Psoriasis 01/15/2021   Chronic pain of left knee 01/15/2021   Ventral hernia without obstruction or gangrene 01/15/2021   Class 3 severe obesity with serious comorbidity and body mass index (BMI) of 40.0 to 44.9 in adult [facility]) 01/15/2019   Acne rosacea 05/10/2016   Multinodular goiter 02/27/2016   Subclinical thyrotoxicosis 02/26/2016   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 09/26/15 09/26/2015   Prediabetes 07/30/2015   Perforation of sigmoid colon ([place]) 02/20/2015   Diverticulitis of large intestine with abscess without bleeding 02/18/2015   OSA (obstructive sleep apnea) 05/09/2013   GASTROENTERITIS 08/18/2010   RAYNAUD'S SYNDROME 02/17/2009   PLANTAR FASCIITIS 02/17/2009   Asthma 06/12/2008   Hyperlipidemia 09/04/2007   DEVIATED SEPTUM 09/04/2007   ALLERGIC RHINITIS 09/04/2007   GERD 09/04/2007   OSTEOARTHRITIS 09/04/2007   HEADACHE 09/04/2007    PCP:   Laurey Morale, MD    REFERRING PROVIDER: Paralee Cancel, MD  REFERRING DIAG: Z51.89 (ICD-10-CM) - Encounter for other specified aftercare  THERAPY DIAG:  Stiffness of  left hip, not elsewhere classified  Pain in left hip  Muscle weakness (generalized)  Difficulty walking  Rationale for Evaluation and Treatment Rehabilitation  ONSET DATE: 02/23/22  SUBJECTIVE:   SUBJECTIVE STATEMENT: "I've been walking to and from pool with my cane.  I feel pretty good."    PERTINENT HISTORY: Abdominal hernia, smoker, COPD, A-Fib, 01/2015 colon resection  PAIN:  Are you having pain? no Left hip: 0/10   PRECAUTIONS: Anterior hip  WEIGHT BEARING RESTRICTIONS No  FALLS:  Has patient fallen in last 6 months? No  LIVING ENVIRONMENT: Lives with: lives with their family Lives in: House/apartment Stairs: level home, 5 steps to enter Has following equipment at home: Single point cane and Environmental consultant - 4 wheeled  OCCUPATION: retired  PLOF: Independent with basic ADLs  PATIENT GOALS : return to walking and exercise; pt has a international trip coming up in March of 2024 and would like to be able to walk   OBJECTIVE:   DIAGNOSTIC FINDINGS:   IMPRESSION: Intraoperative images during left total hip arthroplasty. Normal alignment.  PATIENT SURVEYS:  FOTO 40 62 @ DC 12 pts MCII  COGNITION:  Overall cognitive status: Within functional limits for tasks assessed     SENSATION: WFL   POSTURE: flexed trunk , weight shift left  PALPATION: No TTP noted around ant or lateral hip  LOWER EXTREMITY ROM:  Passive ROM Right eval Left eval  Hip flexion 120 120 with hernia supported by UE; AROM weakness, unable to clear foot   Hip extension  0  Hip abduction    Hip adduction    Hip internal rotation 20 20  Hip external rotation 45 30 p!  Knee flexion [facility] WFL  Knee extension WFL WFL   (Blank rows = not tested)  LOWER EXTREMITY MMT:  MMT Right eval Left eval  Hip flexion 4+/5 3-/5  Hip extension 4+/5 4-/5  Hip abduction 4+/5 4-/5  Hip adduction 4+/5 4/5  Knee flexion 4+/5 4/5  Knee extension 4+/5 4/5   (Blank rows = not tested)   FUNCTIONAL  TESTS:  5 times sit to stand: >15s with UE supported needed  GAIT: Distance walked: 86f Assistive device utilized: Single point cane and Walker - 4 wheeled Level of assistance: Modified independence Comments: Able to walk safely with SPC. Decreased step length on L; limited extension on L, low foot clearance with L   Gait with Rolator WFL     TODAY'S TREATMENT: Pt seen for aquatic therapy today.  Water 3.25-4 ft in depth . Temp of water was 92.  Pt entered/exited the pool via stairs independently with bilat rail. Combination of step to and step through pattern.   Warm up of walking forward/ backward and side stepping holding thick sqoodle - multiple reps Monster walk x 4 widths Hurdle walk x 4 widths Side lunges,  ue add/abd yellow hand buoys x 4 widths    SL Left step up side step 2x12;  Cycling  with breast stroke arms forward and back 2 widths ea; scissor and skii 2x20   Sit to/from stand  3rd then 4th step x 10 without UE support Adduct set using BB x5 with 10s hold STS from 4th step with add set (BB) x5 Standing quad and hip flex stretch left foot on 2nd step Standing hamstring and gastroc stretch on 2 step.   Walking between exercises recovery   Pt requires buoyancy for support and to offload joints with strengthening exercises. Viscosity of the water is needed for resistance of strengthening; water current perturbations provides challenge to standing balance unsupported, requiring increased core activation.    PATIENT EDUCATION:  Education details: exercise progression, DOMS expectations Person educated: Patient Education method: Explanation, Demonstration, Tactile cues, Verbal cues, and Handouts Education comprehension: verbalized understanding, returned demonstration, verbal cues required, and tactile cues required   HOME EXERCISE PROGRAM: Access Code: [email or web address] URL: [email or web address] Date: 04/20/2022 Prepared by: Daleen Bo  ASSESSMENT:  CLINICAL IMPRESSION:  Pt using cane as instructed to and from pool progressing from use of rollator. She reports no discomfort or added fatigue.  Cues for improved gait, increasing stance time lle to decrease limp. Pt able to execute well with cues. Tolerates all exercises in pool without difficulty or pain. L hip rotational ROM wfl adding to and tolerating resistance with improved strength. She continues to benefit form aquatic therapy progressing towards all goals.   OBJECTIVE IMPAIRMENTS Abnormal gait, cardiopulmonary status limiting activity, decreased activity tolerance, decreased balance, decreased endurance, decreased mobility, difficulty walking, decreased ROM, decreased strength, hypomobility, increased muscle spasms, impaired flexibility, improper body mechanics, postural dysfunction, and pain.   ACTIVITY LIMITATIONS carrying, lifting, bending, standing, squatting, stairs, transfers, bed mobility, bathing, toileting, dressing, and locomotion level  PARTICIPATION LIMITATIONS: meal prep, cleaning, laundry, interpersonal relationship, driving, shopping, community activity, yard work, and exercise  PERSONAL FACTORS Age, Fitness, Past/current experiences, Time since onset of injury/illness/exacerbation, and 3+ comorbidities:    are also affecting patient's functional outcome.   REHAB POTENTIAL: Good  CLINICAL DECISION MAKING: Evolving/moderate complexity  EVALUATION COMPLEXITY: Moderate   GOALS:   SHORT TERM GOALS: Target date: 06/01/2022  Pt will become independent with HEP in order to demonstrate synthesis of PT education. .  Goal status: Achieved  2.  Pt will score at least 12 pt increase on FOTO to demonstrate functional improvement in MCII and pt perceived function.    Goal status: ongoing  3.  Pt will be able to demonstrate normal gait on all surfaces with SPC in order to demonstrate functional improvement in LE function for self-care and house hold  duties.   Goal status: Ongoing  LONG TERM GOALS: Target date: 07/13/2022   Pt  will become independent with final HEP in order to demonstrate synthesis of PT education.   Goal status: INITIAL  2.  Pt will be able to demonstrate full reciprocal stair stepping with single UE in order to demonstrate functional improvement in LE function for self-care and house hold duties.  Goal status: INITIAL  3.  Pt will be able to demonstrate/report ability to walk >20 mins without pain in order to demonstrate functional improvement and tolerance to exercise and community mobility.   Goal status: INITIAL  4.  Pt will score >/= 62 on FOTO to demonstrate improvement in perceived L LE function.   Goal status: INITIAL  5.  Pt will  be able to perform 5XSTS in under 12s  in order to demonstrate functional improvement above the cut off score for adults.    Goal status: INITIAL    PLAN: PT FREQUENCY: 1-2x/week  PT DURATION: 12 weeks (likely DC by 10 wks)  PLANNED INTERVENTIONS: Therapeutic exercises, Therapeutic activity, Neuromuscular re-education, Balance training, Gait training, Patient/Family education, Joint manipulation, Joint mobilization, Stair training, Orthotic/Fit training, DME instructions, Aquatic Therapy, Dry Needling, Electrical stimulation, Spinal manipulation, Spinal mobilization, Cryotherapy, Moist heat, Compression bandaging, Taping, Vasopneumatic device, Traction, Ultrasound, Ionotophoresis '4mg'$ /ml Dexamethasone, Manual therapy, and Re-evaluation  PLAN FOR NEXT SESSION:  return to cane,aquatics, balance, general hip strength, stair training  Stanton Kidney Tharon Aquas) Abdurrahman Petersheim MPT 06/09/22 11:49 AM

## 2022-06-15 ENCOUNTER — Ambulatory Visit (HOSPITAL_BASED_OUTPATIENT_CLINIC_OR_DEPARTMENT_OTHER): Payer: BC Managed Care – PPO | Admitting: Physical Therapy

## 2022-06-29 ENCOUNTER — Ambulatory Visit (HOSPITAL_BASED_OUTPATIENT_CLINIC_OR_DEPARTMENT_OTHER): Payer: BC Managed Care – PPO | Attending: Orthopedic Surgery | Admitting: Physical Therapy

## 2022-06-29 ENCOUNTER — Encounter (HOSPITAL_BASED_OUTPATIENT_CLINIC_OR_DEPARTMENT_OTHER): Payer: Self-pay | Admitting: Physical Therapy

## 2022-06-29 DIAGNOSIS — R262 Difficulty in walking, not elsewhere classified: Secondary | ICD-10-CM | POA: Diagnosis present

## 2022-06-29 DIAGNOSIS — M25512 Pain in left shoulder: Secondary | ICD-10-CM | POA: Diagnosis present

## 2022-06-29 DIAGNOSIS — M25652 Stiffness of left hip, not elsewhere classified: Secondary | ICD-10-CM | POA: Insufficient documentation

## 2022-06-29 DIAGNOSIS — G8929 Other chronic pain: Secondary | ICD-10-CM | POA: Diagnosis present

## 2022-06-29 DIAGNOSIS — M25511 Pain in right shoulder: Secondary | ICD-10-CM | POA: Diagnosis present

## 2022-06-29 DIAGNOSIS — M25552 Pain in left hip: Secondary | ICD-10-CM | POA: Diagnosis present

## 2022-06-29 NOTE — Therapy (Signed)
OUTPATIENT PHYSICAL THERAPY LOWER EXTREMITY TREATMENT NOTE   Patient Name: Alison Best MRN: [id number] DOB:[birth year], 66 y.o., female Today's Date: 06/29/2022   PT End of Session - 06/29/22 1300     Visit Number 9    Number of Visits 19    Date for PT Re-Evaluation 07/19/22    Authorization Type BC[employer]    PT Start Time 1116    PT Stop Time 1200    PT Time Calculation (min) 44 min    Activity Tolerance Patient tolerated treatment well    Behavior During Therapy WFL for tasks assessed/performed                 Past Medical History:  Diagnosis Date   Anal fissure    Arthritis    Bursitis    Constipation    COPD (chronic obstructive pulmonary disease) ([place])    Diverticulitis of colon 02/2015   Ectopic pregnancy    Emphysema of lung ([place])    Frozen shoulder    Heart murmur    Hernia, abdominal    Hyperlipidemia    Hyperthyroidism    Infertility, female    Irregular heartbeat    Joint pain    Obesity    Osteoarthritis    Pneumonia    Pre-diabetes    Sleep apnea    Swallowing difficulty    Thyroid disease    hyperthyroid   Past Surgical History:  Procedure Laterality Date   APPENDECTOMY     CESAREAN SECTION     COLONOSCOPY  09/10/2010   per Dr. Acquanetta Sit, diverticulosis of descending colon and sigmoid, internal hemorrhoids, repeat in 10 yrs   COLOSTOMY N/A 02/20/2015   Procedure: COLOSTOMY;  Surgeon: Jackolyn Confer, MD;  [facility];  Service: General;  Laterality: N/A;   COLOSTOMY REVISION  02/20/2015   Procedure: Sigmoid Colectomy;  Surgeon: Jackolyn Confer, MD;  [facility];  Service: General;;   CYSTOSCOPY N/A 02/20/2015   Procedure: Consuela Mimes;  Surgeon: Kathie Rhodes, MD;  [facility];  Service: Urology;  Laterality: N/A;   DILATION AND CURETTAGE OF UTERUS     x3   ECTOPIC PREGNANCY SURGERY     ILEO LOOP COLOSTOMY CLOSURE N/A 09/26/2015   Procedure: LAPAROSCOPIC LYSIS OF ADHESIONS, COLOSTOMY CLOSURE;  Surgeon: Jackolyn Confer, MD;  [facility];  Service: General;  Laterality: N/A;   LAPAROTOMY N/A 02/20/2015   Procedure: Emergency EXPLORATORY and drainiage of interabdominal abcesses;  Surgeon: Jackolyn Confer, MD;  [facility];  Service: General;  Laterality: N/A;   SALPINGOOPHORECTOMY Left 02/20/2015   Procedure: SALPINGO OOPHORECTOMY;  Surgeon: Jackolyn Confer, MD;  [facility];  Service: General;  Laterality: Left;   [place] Left 02/23/2022   Procedure: TOTAL HIP ARTHROPLASTY ANTERIOR APPROACH;  Surgeon: Paralee Cancel, MD;  [facility];  Service: Orthopedics;  Laterality: Left;   Patient Active Problem List   Diagnosis Date Noted   Vitamin D deficiency 03/26/2022   S/P total left hip arthroplasty 02/23/2022   Adjustment disorder with depressed mood 11/18/2021   COVID-19 virus infection 07/07/2021   Psoriasis 01/15/2021   Chronic pain of left knee 01/15/2021   Ventral hernia without obstruction or gangrene 01/15/2021   Class 3 severe obesity with serious comorbidity and body mass index (BMI) of 40.0 to 44.9 in adult [facility]) 01/15/2019   Acne rosacea 05/10/2016   Multinodular goiter 02/27/2016   Subclinical thyrotoxicosis 02/26/2016   Colostomy in place [facility]) s/p  colostomy reversal 09/26/15 09/26/2015   Prediabetes 07/30/2015   Perforation of sigmoid colon ([place]) 02/20/2015   Diverticulitis of large intestine with abscess without bleeding 02/18/2015   OSA (obstructive sleep apnea) 05/09/2013   GASTROENTERITIS 08/18/2010   RAYNAUD'S SYNDROME 02/17/2009   PLANTAR FASCIITIS 02/17/2009   Asthma 06/12/2008   Hyperlipidemia 09/04/2007   DEVIATED SEPTUM 09/04/2007   ALLERGIC RHINITIS 09/04/2007   GERD 09/04/2007   OSTEOARTHRITIS 09/04/2007   HEADACHE 09/04/2007    PCP:   Laurey Morale, MD    REFERRING PROVIDER: Paralee Cancel, MD  REFERRING DIAG: Z51.89 (ICD-10-CM) - Encounter for other specified aftercare  THERAPY DIAG:  Stiffness of  left hip, not elsewhere classified  Pain in left hip  Difficulty walking  Rationale for Evaluation and Treatment Rehabilitation  ONSET DATE: 02/23/22  SUBJECTIVE:   SUBJECTIVE STATEMENT: "I think my hip is good."    PERTINENT HISTORY: Abdominal hernia, smoker, COPD, A-Fib, 01/2015 colon resection  PAIN:  Are you having pain? no Left hip: 0/10   PRECAUTIONS: Anterior hip  WEIGHT BEARING RESTRICTIONS No  FALLS:  Has patient fallen in last 6 months? No  LIVING ENVIRONMENT: Lives with: lives with their family Lives in: House/apartment Stairs: level home, 5 steps to enter Has following equipment at home: Single point cane and Environmental consultant - 4 wheeled  OCCUPATION: retired  PLOF: Independent with basic ADLs  PATIENT GOALS : return to walking and exercise; pt has a international trip coming up in March of 2024 and would like to be able to walk   OBJECTIVE:   DIAGNOSTIC FINDINGS:   IMPRESSION: Intraoperative images during left total hip arthroplasty. Normal alignment.  PATIENT SURVEYS:  FOTO 40 62 @ DC 12 pts MCII  COGNITION:  Overall cognitive status: Within functional limits for tasks assessed     SENSATION: WFL   POSTURE: flexed trunk , weight shift left  PALPATION: No TTP noted around ant or lateral hip  LOWER EXTREMITY ROM:  Passive ROM Right eval Left eval  Hip flexion 120 120 with hernia supported by UE; AROM weakness, unable to clear foot   Hip extension  0  Hip abduction    Hip adduction    Hip internal rotation 20 20  Hip external rotation 45 30 p!  Knee flexion [facility] WFL  Knee extension WFL WFL   (Blank rows = not tested)  LOWER EXTREMITY MMT:  MMT Right eval Left eval  Hip flexion 4+/5 3-/5  Hip extension 4+/5 4-/5  Hip abduction 4+/5 4-/5  Hip adduction 4+/5 4/5  Knee flexion 4+/5 4/5  Knee extension 4+/5 4/5   (Blank rows = not tested)   FUNCTIONAL TESTS:  5 times sit to stand: >15s with UE supported  needed  GAIT: Distance walked: 61f Assistive device utilized: Single point cane and Walker - 4 wheeled Level of assistance: Modified independence Comments: Able to walk safely with SPC. Decreased step length on L; limited extension on L, low foot clearance with L   Gait with Rolator WFL     TODAY'S TREATMENT: Pt seen for aquatic therapy today.  Water 3.25-4 ft in depth . Temp of water was 92.  Pt entered/exited the pool via stairs independently with bilat rail. Combination of step to and step through pattern.  * forward walk with cues for heel strike and roll through 3 laps / side stepping  2 laps; long stride 2 laps; * Step up on step 3'6 depth R/L x10; then onto 2nd step x 5-10. Some difficulty  due to hernia *seated flutter kicking with ankle; seated add/abd with ankle df *Left hip circle in hip flex then abd x 10 CW and CCW *side lunge 4 widths of pool with UE add/abd 1 foam hand buoys *Heel walking 2 widths forward 2 back; toe walking same *forward lunges x 4 widths ue unsupported *Monster walk x 4 widths *Hurdle walk x 4 widths *hip flex stretching *gastroc stretching *hamstring stretching   *Walking between exercises recovery    Pt requires buoyancy for support and to offload joints with strengthening exercises. Viscosity of the water is needed for resistance of strengthening; water current perturbations provides challenge to standing balance unsupported, requiring increased core activation.    PATIENT EDUCATION:  Education details: exercise progression, DOMS expectations Person educated: Patient Education method: Explanation, Demonstration, Tactile cues, Verbal cues, and Handouts Education comprehension: verbalized understanding, returned demonstration, verbal cues required, and tactile cues required   HOME EXERCISE PROGRAM: Access Code: [email or web address] URL: [email or web address] Date: 04/20/2022 Prepared by: Daleen Bo  ASSESSMENT:  CLINICAL  IMPRESSION:  Inga tolerating aquatics very well.  She amb using cane when out of home.  No antalgic limp noted in or out of pool. She is reporting no discomfort/pain prior or after session.  Completes all exercises with good strength.  ROM wfl as observed visually. She has reach her max potential for aquatics for hip rehab.  She has returned to aquatic aerobics at [employer].  Is set for on land re-assessment next visit.    OBJECTIVE IMPAIRMENTS Abnormal gait, cardiopulmonary status limiting activity, decreased activity tolerance, decreased balance, decreased endurance, decreased mobility, difficulty walking, decreased ROM, decreased strength, hypomobility, increased muscle spasms, impaired flexibility, improper body mechanics, postural dysfunction, and pain.   ACTIVITY LIMITATIONS carrying, lifting, bending, standing, squatting, stairs, transfers, bed mobility, bathing, toileting, dressing, and locomotion level  PARTICIPATION LIMITATIONS: meal prep, cleaning, laundry, interpersonal relationship, driving, shopping, community activity, yard work, and exercise  PERSONAL FACTORS Age, Fitness, Past/current experiences, Time since onset of injury/illness/exacerbation, and 3+ comorbidities:    are also affecting patient's functional outcome.   REHAB POTENTIAL: Good  CLINICAL DECISION MAKING: Evolving/moderate complexity  EVALUATION COMPLEXITY: Moderate   GOALS:   SHORT TERM GOALS: Target date: 06/01/2022  Pt will become independent with HEP in order to demonstrate synthesis of PT education. .  Goal status: Achieved  2.  Pt will score at least 12 pt increase on FOTO to demonstrate functional improvement in MCII and pt perceived function.    Goal status: ongoing  3.  Pt will be able to demonstrate normal gait on all surfaces with SPC in order to demonstrate functional improvement in LE function for self-care and house hold duties.   Goal status: Achieved  LONG TERM GOALS: Target date:  07/13/2022   Pt  will become independent with final HEP in order to demonstrate synthesis of PT education.   Goal status: INITIAL  2.  Pt will be able to demonstrate full reciprocal stair stepping with single UE in order to demonstrate functional improvement in LE function for self-care and house hold duties.  Goal status: INITIAL  3.  Pt will be able to demonstrate/report ability to walk >20 mins without pain in order to demonstrate functional improvement and tolerance to exercise and community mobility.   Goal status: INITIAL  4.  Pt will score >/= 62 on FOTO to demonstrate improvement in perceived L LE function.   Goal status: INITIAL  5.  Pt will be able to perform 5XSTS in under 12s  in order to demonstrate functional improvement above the cut off score for adults.    Goal status: INITIAL    PLAN: PT FREQUENCY: 1-2x/week  PT DURATION: 12 weeks (likely DC by 10 wks)  PLANNED INTERVENTIONS: Therapeutic exercises, Therapeutic activity, Neuromuscular re-education, Balance training, Gait training, Patient/Family education, Joint manipulation, Joint mobilization, Stair training, Orthotic/Fit training, DME instructions, Aquatic Therapy, Dry Needling, Electrical stimulation, Spinal manipulation, Spinal mobilization, Cryotherapy, Moist heat, Compression bandaging, Taping, Vasopneumatic device, Traction, Ultrasound, Ionotophoresis '4mg'$ /ml Dexamethasone, Manual therapy, and Re-evaluation  PLAN FOR NEXT SESSION:  return to cane,aquatics, balance, general hip strength, stair training  Stanton Kidney Tharon Aquas) Dahl Higinbotham MPT 06/29/22 1:01 PM

## 2022-07-05 ENCOUNTER — Ambulatory Visit (HOSPITAL_BASED_OUTPATIENT_CLINIC_OR_DEPARTMENT_OTHER): Payer: BC Managed Care – PPO | Admitting: Physical Therapy

## 2022-07-05 ENCOUNTER — Encounter (HOSPITAL_BASED_OUTPATIENT_CLINIC_OR_DEPARTMENT_OTHER): Payer: Self-pay | Admitting: Physical Therapy

## 2022-07-05 DIAGNOSIS — G8929 Other chronic pain: Secondary | ICD-10-CM

## 2022-07-05 DIAGNOSIS — M25652 Stiffness of left hip, not elsewhere classified: Secondary | ICD-10-CM | POA: Diagnosis not present

## 2022-07-05 NOTE — Therapy (Signed)
OUTPATIENT PHYSICAL THERAPY LOWER EXTREMITY TREATMENT NOTE/Discharge/Shoulder evaluation   Patient Name: Alison Best MRN: [id number] DOB:[birth year], 66 y.o., female Today's Date: 07/06/2022   END OF SESSION 07/05/22:  VISIT # 10 NUMBER OF VISITS 22 DATE FOR RE-EVAL 09/24/22 START TIME 1150 STOP TIME 1240 TIME CALCULATION 50 min     Past Medical History:  Diagnosis Date   Anal fissure    Arthritis    Bursitis    Constipation    COPD (chronic obstructive pulmonary disease) ([place])    Diverticulitis of colon 02/2015   Ectopic pregnancy    Emphysema of lung ([facility])    Frozen shoulder    Heart murmur    Hernia, abdominal    Hyperlipidemia    Hyperthyroidism    Infertility, female    Irregular heartbeat    Joint pain    Obesity    Osteoarthritis    Pneumonia    Pre-diabetes    Sleep apnea    Swallowing difficulty    Thyroid disease    hyperthyroid   Past Surgical History:  Procedure Laterality Date   APPENDECTOMY     CESAREAN SECTION     COLONOSCOPY  09/10/2010   per Dr. Acquanetta Sit, diverticulosis of descending colon and sigmoid, internal hemorrhoids, repeat in 10 yrs   COLOSTOMY N/A 02/20/2015   Procedure: COLOSTOMY;  Surgeon: Jackolyn Confer, MD;  [facility];  Service: General;  Laterality: N/A;   COLOSTOMY REVISION  02/20/2015   Procedure: Sigmoid Colectomy;  Surgeon: Jackolyn Confer, MD;  [facility];  Service: General;;   CYSTOSCOPY N/A 02/20/2015   Procedure: Consuela Mimes;  Surgeon: Kathie Rhodes, MD;  [facility];  Service: Urology;  Laterality: N/A;   DILATION AND CURETTAGE OF UTERUS     x3   ECTOPIC PREGNANCY SURGERY     ILEO LOOP COLOSTOMY CLOSURE N/A 09/26/2015   Procedure: LAPAROSCOPIC LYSIS OF ADHESIONS, COLOSTOMY CLOSURE;  Surgeon: Jackolyn Confer, MD;  [facility];  Service: General;  Laterality: N/A;   LAPAROTOMY N/A 02/20/2015   Procedure: Emergency EXPLORATORY and drainiage of interabdominal abcesses;  Surgeon: Jackolyn Confer, MD;  [facility];  Service: General;  Laterality: N/A;   SALPINGOOPHORECTOMY Left 02/20/2015   Procedure: SALPINGO OOPHORECTOMY;  Surgeon: Jackolyn Confer, MD;  [facility];  Service: General;  Laterality: Left;   [place] Left 02/23/2022   Procedure: TOTAL HIP ARTHROPLASTY ANTERIOR APPROACH;  Surgeon: Paralee Cancel, MD;  [facility];  Service: Orthopedics;  Laterality: Left;   Patient Active Problem List   Diagnosis Date Noted   Vitamin D deficiency 03/26/2022   S/P total left hip arthroplasty 02/23/2022   Adjustment disorder with depressed mood 11/18/2021   COVID-19 virus infection 07/07/2021   Psoriasis 01/15/2021   Chronic pain of left knee 01/15/2021   Ventral hernia without obstruction or gangrene 01/15/2021   Class 3 severe obesity with serious comorbidity and body mass index (BMI) of 40.0 to 44.9 in adult [facility]) 01/15/2019   Acne rosacea 05/10/2016   Multinodular goiter 02/27/2016   Subclinical thyrotoxicosis 02/26/2016   Colostomy in place [facility]) s/p colostomy reversal 09/26/15 09/26/2015   Prediabetes 07/30/2015   Perforation of sigmoid colon ([place]) 02/20/2015   Diverticulitis of large intestine with abscess without bleeding 02/18/2015   OSA (obstructive sleep apnea) 05/09/2013   GASTROENTERITIS 08/18/2010   RAYNAUD'S SYNDROME 02/17/2009   PLANTAR FASCIITIS 02/17/2009   Asthma 06/12/2008   Hyperlipidemia 09/04/2007   DEVIATED SEPTUM 09/04/2007  ALLERGIC RHINITIS 09/04/2007   GERD 09/04/2007   OSTEOARTHRITIS 09/04/2007   HEADACHE 09/04/2007    PCP:   Laurey Morale, MD    REFERRING PROVIDER: Paralee Cancel, MD  REFERRING DIAG:  HIP: [phone] (ICD-10-CM) - Encounter for other specified aftercare SHOULDER: M75.02 (ICD-10-CM) - Adhesive capsulitis of left shoulder  THERAPY DIAG:  Chronic right shoulder pain  Chronic left shoulder pain  Rationale for Evaluation and Treatment Rehabilitation  ONSET DATE:  02/23/22  SUBJECTIVE:   SUBJECTIVE STATEMENT: My hip feels fabulous. Did an injection in Left shoulder and it worked really well. The right shoulder is rough also, last night you could hear it crack from 10 ft away. I have had frozen shoulder before but I don't remember which one.    PERTINENT HISTORY: Abdominal hernia, smoker, COPD, A-Fib, 01/2015 colon resection  PAIN:  Hip- 0/10  Shoulder PAIN:  Are you having pain? Yes: NPRS scale: 2 at rest, with movement can be debilitating/10 Pain location: bil shoulder Lt 2/10 >Rt 0/10 Pain description: can be sharp Aggravating factors: movement Relieving factors: rest   FALLS:  Has patient fallen in last 6 months? No  LIVING ENVIRONMENT: Lives with: lives with their family Lives in: House/apartment Stairs: level home, 5 steps to enter Has following equipment at home: Single point cane and Environmental consultant - 4 wheeled  OCCUPATION: retired  PLOF: Independent with basic ADLs  PATIENT GOALS : Hip: return to walking and exercise; pt has a international trip coming up in March of 2024 and would like to be able to walk Shoulder: reaching overhead/at eye level, sleep on left side   OBJECTIVE:   DIAGNOSTIC FINDINGS:   IMPRESSION: Intraoperative images during left total hip arthroplasty. Normal alignment.  PATIENT SURVEYS:  FOTO hip 60 FOTO shoulder 48  POSTURE: able to demo upright posture through the hip  LOWER EXTREMITY ROM:  WFL without incr in pain  LOWER EXTREMITY MMT:  07/05/22: gross 5/5 with exception of Lt hip flexion limited due to hernia   UPPER EXTREMITY MMT:  MMT Right eval Left eval  Shoulder flexion- at elbow 22.4 11.3  Shoulder extension    Shoulder abduction- at elbow 16.8 10.1  Shoulder adduction    Shoulder extension    Shoulder internal rotation    Shoulder external rotation    Grip strength 30 30   (Blank rows = not tested)  UPPER EXTREMITY ROM: Lt UE 75% compared to Rt UE  GAIT: 9/11: mild  vaulting over Lt LE   TODAY'S TREATMENT: 9/11: See plan    PATIENT EDUCATION:  Education details: Anatomy of condition, POC, HEP, exercise form/rationale  Person educated: Patient Education method: Explanation, Demonstration, Tactile cues, Verbal cues, and Handouts Education comprehension: verbalized understanding, returned demonstration, verbal cues required, and tactile cues required   HOME EXERCISE PROGRAM: Access Code:  [email or web address] - hip   ASSESSMENT:  CLINICAL IMPRESSION:  Pt has met her goals regarding her hip and is prepared for d/c to independent strenghtening program.  Shoulder ROM is notably limited in left vs right shoulder but both are painful. Insidious onset of limitations with known spurring consistent with adhesive capsulitis. She will begin with shoulder home program with awareness of motion and pain. Reviewed AROM with recognition of painful limitation and pt will work on this at home while continuing her hip HEP. Pt will benefit from skilled PT to address limitations and meet goals.   HIP: OBJECTIVE IMPAIRMENTS Abnormal gait, cardiopulmonary status limiting activity, decreased activity tolerance, decreased balance, decreased  endurance, decreased mobility, difficulty walking, decreased ROM, decreased strength, hypomobility, increased muscle spasms, impaired flexibility, improper body mechanics, postural dysfunction, and pain.   ACTIVITY LIMITATIONS carrying, lifting, bending, standing, squatting, stairs, transfers, bed mobility, bathing, toileting, dressing, and locomotion level  PARTICIPATION LIMITATIONS: meal prep, cleaning, laundry, interpersonal relationship, driving, shopping, community activity, yard work, and exercise  PERSONAL FACTORS Age, Fitness, Past/current experiences, Time since onset of injury/illness/exacerbation, and 3+ comorbidities:    are also affecting patient's functional outcome.   REHAB POTENTIAL: Good  CLINICAL DECISION MAKING:  Evolving/moderate complexity  EVALUATION COMPLEXITY: Moderate  Shoulder: OBJECTIVE IMPAIRMENTS decreased activity tolerance, decreased ROM, decreased strength, increased muscle spasms, impaired flexibility, impaired UE functional use, improper body mechanics, postural dysfunction, and pain.   ACTIVITY LIMITATIONS carrying, lifting, and reach over head  PARTICIPATION LIMITATIONS: meal prep, cleaning, and laundry  PERSONAL FACTORS 1 comorbidity: h/o adhesive capsulitis, known spurring are also affecting patient's functional outcome.   REHAB POTENTIAL: Good  CLINICAL DECISION MAKING: Evolving/moderate complexity  EVALUATION COMPLEXITY: Moderate   GOALS:   SHORT TERM GOALS: Target date: 06/01/2022  Pt will become independent with HEP in order to demonstrate synthesis of PT education. .  Goal status: Achieved  2.  Pt will score at least 12 pt increase on FOTO to demonstrate functional improvement in MCII and pt perceived function.    Goal status: achieved  3.  Pt will be able to demonstrate normal gait on all surfaces with SPC in order to demonstrate functional improvement in LE function for self-care and house hold duties.   Goal status: Achieved  4.  Independent with stretching program while maintaining pain levels <=3/10 Baseline:  Goal status: INITIAL   LONG TERM GOALS: Target date: 07/13/2022   Pt  will become independent with final HEP in order to demonstrate synthesis of PT education.   Goal status: achieved  2.  Pt will be able to demonstrate full reciprocal stair stepping with single UE in order to demonstrate functional improvement in LE function for self-care and house hold duties.  Goal status:partially met- still working on doing reciprocal steps every time  3.  Pt will be able to demonstrate/report ability to walk >20 mins without pain in order to demonstrate functional improvement and tolerance to exercise and community mobility.   Goal status:  achieved  4.  Pt will score >/= 62 on FOTO to demonstrate improvement in perceived L LE function.   Goal status: see above  5.  Pt will be able to perform 5XSTS in under 12s  in order to demonstrate functional improvement above the cut off score for adults.    Goal status:12s without use of UEs  6.  Bil UE ROM within 10 deg of opp UE Baseline:  Goal status: INITIAL  7.  UE MMT 90% Rt to Lt Baseline:  Goal status: INITIAL  8.  Able to lift dishes to mid and high shelves without incr in pain Baseline:  Goal status: INITIAL     PLAN: PT FREQUENCY: 1x/week  PT DURATION: 12 weeks   PLANNED INTERVENTIONS: Therapeutic exercises, Therapeutic activity, Neuromuscular re-education, Patient/Family education, Joint mobilization, Joint manipulation, Orthotic/Fit training, DME instructions, Aquatic Therapy, Dry Needling, Electrical stimulation, Spinal manipulation, Spinal mobilization, Cryotherapy, Moist heat, Compression bandaging, Taping, Traction, Ultrasound, Ionotophoresis 75m/ml Dexamethasone, Manual therapy, and Re-evaluation  PLAN FOR NEXT SESSION:  shoulder ROM  Wal[place]ga Hudman C. Vesna Kable PT, DPT 07/06/22 8:48 PM

## 2022-07-06 ENCOUNTER — Other Ambulatory Visit: Payer: Self-pay

## 2022-07-06 ENCOUNTER — Ambulatory Visit (HOSPITAL_BASED_OUTPATIENT_CLINIC_OR_DEPARTMENT_OTHER): Payer: Self-pay | Admitting: Physical Therapy

## 2022-07-19 ENCOUNTER — Encounter (HOSPITAL_BASED_OUTPATIENT_CLINIC_OR_DEPARTMENT_OTHER): Payer: Self-pay | Admitting: Physical Therapy

## 2022-07-19 ENCOUNTER — Ambulatory Visit (HOSPITAL_BASED_OUTPATIENT_CLINIC_OR_DEPARTMENT_OTHER): Payer: BC Managed Care – PPO | Admitting: Physical Therapy

## 2022-07-19 DIAGNOSIS — G8929 Other chronic pain: Secondary | ICD-10-CM

## 2022-07-19 DIAGNOSIS — M25652 Stiffness of left hip, not elsewhere classified: Secondary | ICD-10-CM

## 2022-07-19 NOTE — Therapy (Signed)
OUTPATIENT PHYSICAL THERAPY Shoulder evaluation   Patient Name: Gary Bultman MRN: [id number] DOB:[birth year], 66 y.o., female Today's Date: 07/19/2022   PT End of Session - 07/19/22 1106     Visit Number 11    Number of Visits 22    Date for PT Re-Evaluation 09/24/22    Authorization Type BCBS    PT Start Time 1105    PT Stop Time 1145    PT Time Calculation (min) 40 min    Activity Tolerance Patient tolerated treatment well    Behavior During Therapy WFL for tasks assessed/performed              Past Medical History:  Diagnosis Date   Anal fissure    Arthritis    Bursitis    Constipation    COPD (chronic obstructive pulmonary disease) ([place])    Diverticulitis of colon 02/2015   Ectopic pregnancy    Emphysema of lung ([facility])    Frozen shoulder    Heart murmur    Hernia, abdominal    Hyperlipidemia    Hyperthyroidism    Infertility, female    Irregular heartbeat    Joint pain    Obesity    Osteoarthritis    Pneumonia    Pre-diabetes    Sleep apnea    Swallowing difficulty    Thyroid disease    hyperthyroid   Past Surgical History:  Procedure Laterality Date   APPENDECTOMY     CESAREAN SECTION     COLONOSCOPY  09/10/2010   per Dr. Acquanetta Sit, diverticulosis of descending colon and sigmoid, internal hemorrhoids, repeat in 10 yrs   COLOSTOMY N/A 02/20/2015   Procedure: COLOSTOMY;  Surgeon: Jackolyn Confer, MD;  Location: [facility];  Service: General;  Laterality: N/A;   COLOSTOMY REVISION  02/20/2015   Procedure: Sigmoid Colectomy;  Surgeon: Jackolyn Confer, MD;  Location: [facility];  Service: General;;   CYSTOSCOPY N/A 02/20/2015   Procedure: Consuela Mimes;  Surgeon: Kathie Rhodes, MD;  Location: [facility];  Service: Urology;  Laterality: N/A;   DILATION AND CURETTAGE OF UTERUS     x3   ECTOPIC PREGNANCY SURGERY     ILEO LOOP COLOSTOMY CLOSURE N/A 09/26/2015   Procedure: LAPAROSCOPIC LYSIS OF ADHESIONS, COLOSTOMY CLOSURE;  Surgeon: Jackolyn Confer, MD;   [facility];  Service: General;  Laterality: N/A;   LAPAROTOMY N/A 02/20/2015   Procedure: Emergency EXPLORATORY and drainiage of interabdominal abcesses;  Surgeon: Jackolyn Confer, MD;  [facility];  Service: General;  Laterality: N/A;   SALPINGOOPHORECTOMY Left 02/20/2015   Procedure: SALPINGO OOPHORECTOMY;  Surgeon: Jackolyn Confer, MD;  [facility];  Service: General;  Laterality: Left;   [place] Left 02/23/2022   Procedure: TOTAL HIP ARTHROPLASTY ANTERIOR APPROACH;  Surgeon: Paralee Cancel, MD;  Location: [facility];  Service: Orthopedics;  Laterality: Left;   Patient Active Problem List   Diagnosis Date Noted   Vitamin D deficiency 03/26/2022   S/P total left hip arthroplasty 02/23/2022   Adjustment disorder with depressed mood 11/18/2021   COVID-19 virus infection 07/07/2021   Psoriasis 01/15/2021   Chronic pain of left knee 01/15/2021   Ventral hernia without obstruction or gangrene 01/15/2021   Class 3 severe obesity with serious comorbidity and body mass index (BMI) of 40.0 to 44.9 in adult [facility]) 01/15/2019   Acne rosacea 05/10/2016   Multinodular goiter 02/27/2016   Subclinical thyrotoxicosis 02/26/2016   Colostomy in place [facility]) s/p colostomy reversal 09/26/15 09/26/2015  Prediabetes 07/30/2015   Perforation of sigmoid colon ([place]) 02/20/2015   Diverticulitis of large intestine with abscess without bleeding 02/18/2015   OSA (obstructive sleep apnea) 05/09/2013   GASTROENTERITIS 08/18/2010   RAYNAUD'S SYNDROME 02/17/2009   PLANTAR FASCIITIS 02/17/2009   Asthma 06/12/2008   Hyperlipidemia 09/04/2007   DEVIATED SEPTUM 09/04/2007   ALLERGIC RHINITIS 09/04/2007   GERD 09/04/2007   OSTEOARTHRITIS 09/04/2007   HEADACHE 09/04/2007    PCP:   Laurey Morale, MD    REFERRING PROVIDER: Paralee Cancel, MD  REFERRING DIAG:  SHOULDER: M75.02 (ICD-10-CM) - Adhesive capsulitis of left shoulder  THERAPY DIAG:  Chronic right  shoulder pain  Chronic left shoulder pain  Stiffness of left hip, not elsewhere classified  Rationale for Evaluation and Treatment Rehabilitation  ONSET DATE: 02/23/22  SUBJECTIVE:   SUBJECTIVE STATEMENT: Went to reach 1 time across body with left shoulder and had to stop. Got COVID and flu shots on Friday.    PERTINENT HISTORY: Abdominal hernia, smoker, COPD, A-Fib, 01/2015 colon resection  PAIN:  Are you having pain? Yes: NPRS scale: 3/10 Pain location: bil shoulder Lt 2/10 >Rt 0/10 Pain description: can be sharp Aggravating factors: movement Relieving factors: rest   FALLS:  Has patient fallen in last 6 months? No  LIVING ENVIRONMENT: Lives with: lives with their family Lives in: House/apartment Stairs: level home, 5 steps to enter Has following equipment at home: Single point cane and Environmental consultant - 4 wheeled  OCCUPATION: retired  PLOF: Independent with basic ADLs  PATIENT GOALS : Hip: return to walking and exercise; pt has a international trip coming up in March of 2024 and would like to be able to walk Shoulder: reaching overhead/at eye level, sleep on left side   OBJECTIVE:   DIAGNOSTIC FINDINGS:   IMPRESSION: Intraoperative images during left total hip arthroplasty. Normal alignment.  PATIENT SURVEYS:  FOTO shoulder 48  POSTURE: able to demo upright posture through the hip  LOWER EXTREMITY ROM:  WFL without incr in pain  LOWER EXTREMITY MMT:  07/05/22: gross 5/5 with exception of Lt hip flexion limited due to hernia   UPPER EXTREMITY MMT:  MMT Right eval Left eval  Shoulder flexion- at elbow 22.4 11.3  Shoulder extension    Shoulder abduction- at elbow 16.8 10.1  Shoulder adduction    Shoulder extension    Shoulder internal rotation    Shoulder external rotation    Grip strength 30 30   (Blank rows = not tested)  UPPER EXTREMITY ROM: Lt UE 75% compared to Rt UE  GAIT: 9/11: mild vaulting over Lt LE   TODAY'S  TREATMENT: 9/25: Trigger Point Dry Needling, Manual Therapy Treatment:  Initial or subsequent education regarding Trigger Point Dry Needling: Initial Did patient give consent to treatment with Trigger Point Dry Needling: Yes TPDN with skilled palpation and monitoring followed by STM to the following muscles: Lt upper trap  Prone rib mobility on Left side Pulleys- flexion Fwd punch with adduction resistance red tband Bil shoulder flexion- abd pull on red tband Wall walk with liftoff      PATIENT EDUCATION:  Education details: Geophysicist/field seismologist of condition, POC, HEP, exercise form/rationale Person educated: Patient Education method: Explanation, Demonstration, Tactile cues, Verbal cues, and Handouts Education comprehension: verbalized understanding, returned demonstration, verbal cues required, and tactile cues required   HOME EXERCISE PROGRAM: Access Code:  [email or web address] - hip [email or web address] - shoulder   ASSESSMENT:  CLINICAL IMPRESSION:  DN to Lt shoulder was successful in improving ROM and decreasing pain,  will continue this treatment PRN. May consider DN to deltoids but waiting for soreness from injections to reduce. Tightness noted in Lt shoulder capsule vs Rt but is able to demo scapular control, some cues required to reduce shoulder hike.    HIP: OBJECTIVE IMPAIRMENTS Abnormal gait, cardiopulmonary status limiting activity, decreased activity tolerance, decreased balance, decreased endurance, decreased mobility, difficulty walking, decreased ROM, decreased strength, hypomobility, increased muscle spasms, impaired flexibility, improper body mechanics, postural dysfunction, and pain.   ACTIVITY LIMITATIONS carrying, lifting, bending, standing, squatting, stairs, transfers, bed mobility, bathing, toileting, dressing, and locomotion level  PARTICIPATION LIMITATIONS: meal prep, cleaning, laundry, interpersonal relationship, driving, shopping, community activity, yard work, and  exercise  PERSONAL FACTORS Age, Fitness, Past/current experiences, Time since onset of injury/illness/exacerbation, and 3+ comorbidities:    are also affecting patient's functional outcome.   REHAB POTENTIAL: Good  CLINICAL DECISION MAKING: Evolving/moderate complexity  EVALUATION COMPLEXITY: Moderate  Shoulder: OBJECTIVE IMPAIRMENTS decreased activity tolerance, decreased ROM, decreased strength, increased muscle spasms, impaired flexibility, impaired UE functional use, improper body mechanics, postural dysfunction, and pain.   ACTIVITY LIMITATIONS carrying, lifting, and reach over head  PARTICIPATION LIMITATIONS: meal prep, cleaning, and laundry  PERSONAL FACTORS 1 comorbidity: h/o adhesive capsulitis, known spurring are also affecting patient's functional outcome.   REHAB POTENTIAL: Good  CLINICAL DECISION MAKING: Evolving/moderate complexity  EVALUATION COMPLEXITY: Moderate   GOALS:   SHORT TERM GOALS:  1.  Independent with stretching program while maintaining pain levels <=3/10 Baseline:  Goal status: 9/25 achieved at this point and will continue to progress   LONG TERM GOALS: Target date:POC date  1.  Bil UE ROM within 10 deg of opp UE Baseline:  Goal status: INITIAL  2.  UE MMT 90% Rt to Lt Baseline:  Goal status: INITIAL  3.  Able to lift dishes to mid and high shelves without incr in pain Baseline:  Goal status: INITIAL     PLAN: PT FREQUENCY: 1x/week  PT DURATION: 12 weeks   PLANNED INTERVENTIONS: Therapeutic exercises, Therapeutic activity, Neuromuscular re-education, Patient/Family education, Joint mobilization, Joint manipulation, Orthotic/Fit training, DME instructions, Aquatic Therapy, Dry Needling, Electrical stimulation, Spinal manipulation, Spinal mobilization, Cryotherapy, Moist heat, Compression bandaging, Taping, Traction, Ultrasound, Ionotophoresis '4mg'$ /ml Dexamethasone, Manual therapy, and Re-evaluation  PLAN FOR NEXT SESSION:  shoulder  ROM, periscap stability  Tanaya Dunigan C. Holland Kotter PT, DPT 07/19/22 12:52 PM

## 2022-07-26 ENCOUNTER — Ambulatory Visit (HOSPITAL_BASED_OUTPATIENT_CLINIC_OR_DEPARTMENT_OTHER): Payer: Medicare PPO | Attending: Orthopedic Surgery | Admitting: Physical Therapy

## 2022-07-26 ENCOUNTER — Encounter (HOSPITAL_BASED_OUTPATIENT_CLINIC_OR_DEPARTMENT_OTHER): Payer: Self-pay | Admitting: Physical Therapy

## 2022-07-26 DIAGNOSIS — M25512 Pain in left shoulder: Secondary | ICD-10-CM | POA: Diagnosis present

## 2022-07-26 DIAGNOSIS — G8929 Other chronic pain: Secondary | ICD-10-CM | POA: Insufficient documentation

## 2022-07-26 DIAGNOSIS — M25511 Pain in right shoulder: Secondary | ICD-10-CM | POA: Insufficient documentation

## 2022-07-26 NOTE — Therapy (Signed)
OUTPATIENT PHYSICAL THERAPY Shoulder evaluation   Patient Name: Alison Best MRN: [id number] DOB:06/24/1956, 66 y.o., female Today's Date: 07/26/2022   PT End of Session - 07/26/22 0847     Visit Number 12    Number of Visits 22    Date for PT Re-Evaluation 09/24/22    Authorization Type BCBS    PT Start Time 0847    PT Stop Time 0931    PT Time Calculation (min) 44 min    Activity Tolerance Patient tolerated treatment well    Behavior During Therapy [facility] for tasks assessed/performed               Past Medical History:  Diagnosis Date   Anal fissure    Arthritis    Bursitis    Constipation    COPD (chronic obstructive pulmonary disease) ([place])    Diverticulitis of colon 02/2015   Ectopic pregnancy    Emphysema of lung ([place])    Frozen shoulder    Heart murmur    Hernia, abdominal    Hyperlipidemia    Hyperthyroidism    Infertility, female    Irregular heartbeat    Joint pain    Obesity    Osteoarthritis    Pneumonia    Pre-diabetes    Sleep apnea    Swallowing difficulty    Thyroid disease    hyperthyroid   Past Surgical History:  Procedure Laterality Date   APPENDECTOMY     CESAREAN SECTION     COLONOSCOPY  09/10/2010   per Dr. Acquanetta Sit, diverticulosis of descending colon and sigmoid, internal hemorrhoids, repeat in 10 yrs   COLOSTOMY N/A 02/20/2015   Procedure: COLOSTOMY;  Surgeon: Jackolyn Confer, MD;  [facility];  Service: General;  Laterality: N/A;   COLOSTOMY REVISION  02/20/2015   Procedure: Sigmoid Colectomy;  Surgeon: Jackolyn Confer, MD;  [facility];  Service: General;;   CYSTOSCOPY N/A 02/20/2015   Procedure: Consuela Mimes;  Surgeon: Kathie Rhodes, MD;  [facility];  Service: Urology;  Laterality: N/A;   DILATION AND CURETTAGE OF UTERUS     x3   ECTOPIC PREGNANCY SURGERY     ILEO LOOP COLOSTOMY CLOSURE N/A 09/26/2015   Procedure: LAPAROSCOPIC LYSIS OF ADHESIONS, COLOSTOMY CLOSURE;  Surgeon: Jackolyn Confer, MD;   [facility];  Service: General;  Laterality: N/A;   LAPAROTOMY N/A 02/20/2015   Procedure: Emergency EXPLORATORY and drainiage of interabdominal abcesses;  Surgeon: Jackolyn Confer, MD;  [facility];  Service: General;  Laterality: N/A;   SALPINGOOPHORECTOMY Left 02/20/2015   Procedure: SALPINGO OOPHORECTOMY;  Surgeon: Jackolyn Confer, MD;  [facility];  Service: General;  Laterality: Left;   [place] Left 02/23/2022   Procedure: TOTAL HIP ARTHROPLASTY ANTERIOR APPROACH;  Surgeon: Paralee Cancel, MD;  [facility];  Service: Orthopedics;  Laterality: Left;   Patient Active Problem List   Diagnosis Date Noted   Vitamin D deficiency 03/26/2022   S/P total left hip arthroplasty 02/23/2022   Adjustment disorder with depressed mood 11/18/2021   COVID-19 virus infection 07/07/2021   Psoriasis 01/15/2021   Chronic pain of left knee 01/15/2021   Ventral hernia without obstruction or gangrene 01/15/2021   Class 3 severe obesity with serious comorbidity and body mass index (BMI) of 40.0 to 44.9 in adult [facility]) 01/15/2019   Acne rosacea 05/10/2016   Multinodular goiter 02/27/2016   Subclinical thyrotoxicosis 02/26/2016   Colostomy in place [facility]) s/p colostomy reversal 09/26/15 09/26/2015  Prediabetes 07/30/2015   Perforation of sigmoid colon ([place]) 02/20/2015   Diverticulitis of large intestine with abscess without bleeding 02/18/2015   OSA (obstructive sleep apnea) 05/09/2013   GASTROENTERITIS 08/18/2010   RAYNAUD'S SYNDROME 02/17/2009   PLANTAR FASCIITIS 02/17/2009   Asthma 06/12/2008   Hyperlipidemia 09/04/2007   DEVIATED SEPTUM 09/04/2007   ALLERGIC RHINITIS 09/04/2007   GERD 09/04/2007   OSTEOARTHRITIS 09/04/2007   HEADACHE 09/04/2007    PCP:   Laurey Morale, MD    REFERRING PROVIDER: Paralee Cancel, MD  REFERRING DIAG:  SHOULDER: M75.02 (ICD-10-CM) - Adhesive capsulitis of left shoulder  THERAPY DIAG:  Chronic right  shoulder pain  Rationale for Evaluation and Treatment Rehabilitation  ONSET DATE: 02/23/22  SUBJECTIVE:   SUBJECTIVE STATEMENT: I really feel like DN made a difference. Still feel pinching at end range.    PERTINENT HISTORY: Abdominal hernia, smoker, COPD, A-Fib, 01/2015 colon resection  PAIN:  Are you having pain? Yes: NPRS scale: 1/10 Pain location: bil shoulder Lt 2/10 >Rt 0/10 Pain description: can be sharp Aggravating factors: reaching into cabinet Relieving factors: rest   FALLS:  Has patient fallen in last 6 months? No  LIVING ENVIRONMENT: Lives with: lives with their family Lives in: House/apartment Stairs: level home, 5 steps to enter Has following equipment at home: Single point cane and Environmental consultant - 4 wheeled  OCCUPATION: retired  PLOF: Independent with basic ADLs  PATIENT GOALS : Hip: return to walking and exercise; pt has a international trip coming up in March of 2024 and would like to be able to walk Shoulder: reaching overhead/at eye level, sleep on left side   OBJECTIVE:   DIAGNOSTIC FINDINGS:   IMPRESSION: Intraoperative images during left total hip arthroplasty. Normal alignment.  PATIENT SURVEYS:  FOTO shoulder 48  POSTURE: able to demo upright posture through the hip  LOWER EXTREMITY ROM:  WFL without incr in pain  LOWER EXTREMITY MMT:  07/05/22: gross 5/5 with exception of Lt hip flexion limited due to hernia   UPPER EXTREMITY MMT:  MMT Right eval Left eval  Shoulder flexion- at elbow 22.4 11.3  Shoulder extension    Shoulder abduction- at elbow 16.8 10.1  Shoulder adduction    Shoulder extension    Shoulder internal rotation    Shoulder external rotation    Grip strength 30 30   (Blank rows = not tested)  UPPER EXTREMITY ROM: Lt UE 75% compared to Rt UE  GAIT: 9/11: mild vaulting over Lt LE   TODAY'S TREATMENT:  Treatment                            07/26/22:  MANUAL: STM subscap & upper trap, subscap stretching  with passive flexion; Grade 4 mobs at 90 abd AP, Grade 4 inf mobs at end range flexion  Supine shoulder flexion 3lb bar Supine serratus punches 2lb Scaption stretch roll on physioball Flexion stretch on physioball- lat press down on return Wall push up- elbows wide Pendulum hang 2 lb   Treatment                            07/19/22: Trigger Point Dry Needling, Manual Therapy Treatment:  Initial or subsequent education regarding Trigger Point Dry Needling: Initial Did patient give consent to treatment with Trigger Point Dry Needling: Yes TPDN with skilled palpation and monitoring followed by STM to the following muscles: Lt upper trap  Prone  rib mobility on Left side Pulleys- flexion Fwd punch with adduction resistance red tband Bil shoulder flexion- abd pull on red tband Wall walk with liftoff      PATIENT EDUCATION:  Education details: Anatomy of condition, POC, HEP, exercise form/rationale Person educated: Patient Education method: Explanation, Demonstration, Tactile cues, Verbal cues, and Handouts Education comprehension: verbalized understanding, returned demonstration, verbal cues required, and tactile cues required   HOME EXERCISE PROGRAM: Access Code:  [email or web address] - hip [email or web address] - shoulder   ASSESSMENT:  CLINICAL IMPRESSION:  Significant tightness noted in subscap today and motion improved with release- able to reach overhead with less shoulder hike and less discomfort. Tolerated exercises well and required minimal cues. Discussed use of belly band support of hernia as an option if she chooses to hold on surgery until after her trip to [place] in March.    HIP: OBJECTIVE IMPAIRMENTS Abnormal gait, cardiopulmonary status limiting activity, decreased activity tolerance, decreased balance, decreased endurance, decreased mobility, difficulty walking, decreased ROM, decreased strength, hypomobility, increased muscle spasms, impaired flexibility, improper body mechanics,  postural dysfunction, and pain.   ACTIVITY LIMITATIONS carrying, lifting, bending, standing, squatting, stairs, transfers, bed mobility, bathing, toileting, dressing, and locomotion level  PARTICIPATION LIMITATIONS: meal prep, cleaning, laundry, interpersonal relationship, driving, shopping, community activity, yard work, and exercise  PERSONAL FACTORS Age, Fitness, Past/current experiences, Time since onset of injury/illness/exacerbation, and 3+ comorbidities:    are also affecting patient's functional outcome.   REHAB POTENTIAL: Good  CLINICAL DECISION MAKING: Evolving/moderate complexity  EVALUATION COMPLEXITY: Moderate  Shoulder: OBJECTIVE IMPAIRMENTS decreased activity tolerance, decreased ROM, decreased strength, increased muscle spasms, impaired flexibility, impaired UE functional use, improper body mechanics, postural dysfunction, and pain.   ACTIVITY LIMITATIONS carrying, lifting, and reach over head  PARTICIPATION LIMITATIONS: meal prep, cleaning, and laundry  PERSONAL FACTORS 1 comorbidity: h/o adhesive capsulitis, known spurring are also affecting patient's functional outcome.   REHAB POTENTIAL: Good  CLINICAL DECISION MAKING: Evolving/moderate complexity  EVALUATION COMPLEXITY: Moderate   GOALS:   SHORT TERM GOALS:  1.  Independent with stretching program while maintaining pain levels <=3/10 Baseline:  Goal status: 9/25 achieved at this point and will continue to progress   LONG TERM GOALS: Target date:POC date  1.  Bil UE ROM within 10 deg of opp UE Baseline:  Goal status: INITIAL  2.  UE MMT 90% Rt to Lt Baseline:  Goal status: INITIAL  3.  Able to lift dishes to mid and high shelves without incr in pain Baseline:  Goal status: INITIAL     PLAN: PT FREQUENCY: 1x/week  PT DURATION: 12 weeks   PLANNED INTERVENTIONS: Therapeutic exercises, Therapeutic activity, Neuromuscular re-education, Patient/Family education, Joint mobilization, Joint  manipulation, Orthotic/Fit training, DME instructions, Aquatic Therapy, Dry Needling, Electrical stimulation, Spinal manipulation, Spinal mobilization, Cryotherapy, Moist heat, Compression bandaging, Taping, Traction, Ultrasound, Ionotophoresis '4mg'$ /ml Dexamethasone, Manual therapy, and Re-evaluation  PLAN FOR NEXT SESSION:  shoulder ROM, periscap stability  Virgene Tirone C. Waylen Depaolo PT, DPT 07/26/22 12:43 PM

## 2022-07-26 NOTE — Telephone Encounter (Signed)
Yes she should get the RSV vaccine, but wait about 2 weeks

## 2022-08-02 ENCOUNTER — Ambulatory Visit (HOSPITAL_BASED_OUTPATIENT_CLINIC_OR_DEPARTMENT_OTHER): Payer: Medicare PPO | Admitting: Physical Therapy

## 2022-08-02 ENCOUNTER — Encounter (HOSPITAL_BASED_OUTPATIENT_CLINIC_OR_DEPARTMENT_OTHER): Payer: Self-pay

## 2022-08-06 NOTE — Therapy (Addendum)
OUTPATIENT PHYSICAL THERAPY Shoulder evaluation   Patient Name: Alison Best MRN: [id number] DOB:1956/07/09, 66 y.o., female Today's Date: 08/23/2022        Past Medical History:  Diagnosis Date   Anal fissure    Arthritis    Bursitis    Constipation    COPD (chronic obstructive pulmonary disease) ([place])    Diverticulitis of colon 02/2015   Ectopic pregnancy    Emphysema of lung ([facility])    Frozen shoulder    Heart murmur    Hernia, abdominal    Hyperlipidemia    Hyperthyroidism    Infertility, female    Irregular heartbeat    Joint pain    Obesity    Osteoarthritis    Pneumonia    Pre-diabetes    Sleep apnea    Swallowing difficulty    Thyroid disease    hyperthyroid   Past Surgical History:  Procedure Laterality Date   APPENDECTOMY     CESAREAN SECTION     COLONOSCOPY  09/10/2010   per Dr. Acquanetta Sit, diverticulosis of descending colon and sigmoid, internal hemorrhoids, repeat in 10 yrs   COLOSTOMY N/A 02/20/2015   Procedure: COLOSTOMY;  Surgeon: Jackolyn Confer, MD;  Location: [facility];  Service: General;  Laterality: N/A;   COLOSTOMY REVISION  02/20/2015   Procedure: Sigmoid Colectomy;  Surgeon: Jackolyn Confer, MD;  Location: [facility];  Service: General;;   CYSTOSCOPY N/A 02/20/2015   Procedure: Consuela Mimes;  Surgeon: Kathie Rhodes, MD;  [facility];  Service: Urology;  Laterality: N/A;   DILATION AND CURETTAGE OF UTERUS     x3   ECTOPIC PREGNANCY SURGERY     ILEO LOOP COLOSTOMY CLOSURE N/A 09/26/2015   Procedure: LAPAROSCOPIC LYSIS OF ADHESIONS, COLOSTOMY CLOSURE;  Surgeon: Jackolyn Confer, MD;  [facility];  Service: General;  Laterality: N/A;   LAPAROTOMY N/A 02/20/2015   Procedure: Emergency EXPLORATORY and drainiage of interabdominal abcesses;  Surgeon: Jackolyn Confer, MD;  [facility];  Service: General;  Laterality: N/A;   SALPINGOOPHORECTOMY Left 02/20/2015   Procedure: SALPINGO OOPHORECTOMY;  Surgeon: Jackolyn Confer, MD;   [facility];  Service: General;  Laterality: Left;   [place] Left 02/23/2022   Procedure: TOTAL HIP ARTHROPLASTY ANTERIOR APPROACH;  Surgeon: Paralee Cancel, MD;  [facility];  Service: Orthopedics;  Laterality: Left;   Patient Active Problem List   Diagnosis Date Noted   Vitamin D deficiency 03/26/2022   S/P total left hip arthroplasty 02/23/2022   Adjustment disorder with depressed mood 11/18/2021   COVID-19 virus infection 07/07/2021   Psoriasis 01/15/2021   Chronic pain of left knee 01/15/2021   Ventral hernia without obstruction or gangrene 01/15/2021   Class 3 severe obesity with serious comorbidity and body mass index (BMI) of 40.0 to 44.9 in adult [facility]) 01/15/2019   Acne rosacea 05/10/2016   Multinodular goiter 02/27/2016   Subclinical thyrotoxicosis 02/26/2016   Colostomy in place [facility]) s/p colostomy reversal 09/26/15 09/26/2015   Prediabetes 07/30/2015   Perforation of sigmoid colon ([place]) 02/20/2015   Diverticulitis of large intestine with abscess without bleeding 02/18/2015   OSA (obstructive sleep apnea) 05/09/2013   GASTROENTERITIS 08/18/2010   RAYNAUD'S SYNDROME 02/17/2009   PLANTAR FASCIITIS 02/17/2009   Asthma 06/12/2008   Hyperlipidemia 09/04/2007   DEVIATED SEPTUM 09/04/2007   ALLERGIC RHINITIS 09/04/2007   GERD 09/04/2007   OSTEOARTHRITIS 09/04/2007   HEADACHE 09/04/2007    PCP:  Laurey Morale, MD  REFERRING PROVIDER:  Paralee Cancel, MD  REFERRING DIAG:  SHOULDER: M75.02 (ICD-10-CM) - Adhesive capsulitis of left shoulder  THERAPY DIAG: Chronic left shoulder pain   Rationale for Evaluation and Treatment Rehabilitation  ONSET DATE: 02/23/22  SUBJECTIVE:   SUBJECTIVE STATEMENT: Pt states that she is able to do more with her shoulder. She is only having intermittent pinching at end range.   PERTINENT HISTORY: Abdominal hernia, smoker, COPD, A-Fib, 01/2015 colon resection  PAIN:  Are you having pain? Yes:  NPRS scale: 1/10 Pain location: bil shoulder Lt 2/10 >Rt 0/10 Pain description: can be sharp Aggravating factors: reaching into cabinet Relieving factors: rest   FALLS:  Has patient fallen in last 6 months? No  LIVING ENVIRONMENT: Lives with: lives with their family Lives in: House/apartment Stairs: level home, 5 steps to enter Has following equipment at home: Single point cane and Environmental consultant - 4 wheeled  OCCUPATION: retired  PLOF: Independent with basic ADLs  PATIENT GOALS : Hip: return to walking and exercise; pt has a international trip coming up in March of 2024 and would like to be able to walk Shoulder: reaching overhead/at eye level, sleep on left side   OBJECTIVE:   DIAGNOSTIC FINDINGS:   IMPRESSION: Intraoperative images during left total hip arthroplasty. Normal alignment.  PATIENT SURVEYS:  FOTO shoulder 48  POSTURE: able to demo upright posture through the hip  LOWER EXTREMITY ROM:  WFL without incr in pain  LOWER EXTREMITY MMT:  07/05/22: gross 5/5 with exception of Lt hip flexion limited due to hernia   UPPER EXTREMITY MMT:  MMT Right eval Left eval L/R  Shoulder flexion- at elbow 22.4 11.3 Shoulder ROM at GHJ - 138/140   Shoulder extension     Shoulder abduction- at elbow 16.8 10.1 Shoulder ROM at GHJ - 108/ 145  Shoulder adduction     Shoulder extension     Shoulder internal rotation   C6  Shoulder external rotation   T12  Grip strength 30 30    (Blank rows = not tested)  UPPER EXTREMITY ROM: Lt UE 75% compared to Rt UE  GAIT: 9/11: mild vaulting over Lt LE   TODAY'S TREATMENT: Treatment                            08/09/22:  Updated ROM measurements   MANUAL: STM subscap & upper trap; Grade 4 mobs at 90 abd AP, Grade 4 inf mobs at end range flexion  Supine shoulder flexion 3lb bar Supine serratus punches 2lb Scaption stretch roll on physioball Flexion stretch on physioball- lat press down on return Wall push up- elbows  wide Rows 2x10 with RTB Bilat ER with RTB 2x10   Treatment                            07/26/22:  MANUAL: STM subscap & upper trap, subscap stretching with passive flexion; Grade 4 mobs at 90 abd AP, Grade 4 inf mobs at end range flexion  Supine shoulder flexion 3lb bar Supine serratus punches 2lb Scaption stretch roll on physioball Flexion stretch on physioball- lat press down on return Wall push up- elbows wide Pendulum hang 2 lb   Treatment                            07/19/22: Trigger Point Dry Needling, Manual Therapy Treatment:  Initial or subsequent education regarding  Trigger Point Dry Needling: Initial Did patient give consent to treatment with Trigger Point Dry Needling: Yes TPDN with skilled palpation and monitoring followed by STM to the following muscles: Lt upper trap  Prone rib mobility on Left side Pulleys- flexion Fwd punch with adduction resistance red tband Bil shoulder flexion- abd pull on red tband Wall walk with liftoff      PATIENT EDUCATION:  Education details: Geophysicist/field seismologist of condition, POC, HEP, exercise form/rationale Person educated: Patient Education method: Consulting civil engineer, Demonstration, Tactile cues, Verbal cues, and Handouts Education comprehension: verbalized understanding, returned demonstration, verbal cues required, and tactile cues required   HOME EXERCISE PROGRAM: Access Code:  [email or web address] - hip [email or web address] - shoulder   ASSESSMENT:  CLINICAL IMPRESSION:  Significant tightness noted in subscap today and motion improved with release- able to reach overhead with less shoulder hike and less discomfort. Tolerated exercises well and required minimal cues. Discussed use of belly band support of hernia as an option if she chooses to hold on surgery until after her trip to [place] in March.    HIP: OBJECTIVE IMPAIRMENTS Abnormal gait, cardiopulmonary status limiting activity, decreased activity tolerance, decreased balance, decreased endurance, decreased  mobility, difficulty walking, decreased ROM, decreased strength, hypomobility, increased muscle spasms, impaired flexibility, improper body mechanics, postural dysfunction, and pain.   ACTIVITY LIMITATIONS carrying, lifting, bending, standing, squatting, stairs, transfers, bed mobility, bathing, toileting, dressing, and locomotion level  PARTICIPATION LIMITATIONS: meal prep, cleaning, laundry, interpersonal relationship, driving, shopping, community activity, yard work, and exercise  PERSONAL FACTORS Age, Fitness, Past/current experiences, Time since onset of injury/illness/exacerbation, and 3+ comorbidities:    are also affecting patient's functional outcome.   REHAB POTENTIAL: Good  CLINICAL DECISION MAKING: Evolving/moderate complexity  EVALUATION COMPLEXITY: Moderate  Shoulder: OBJECTIVE IMPAIRMENTS decreased activity tolerance, decreased ROM, decreased strength, increased muscle spasms, impaired flexibility, impaired UE functional use, improper body mechanics, postural dysfunction, and pain.   ACTIVITY LIMITATIONS carrying, lifting, and reach over head  PARTICIPATION LIMITATIONS: meal prep, cleaning, and laundry  PERSONAL FACTORS 1 comorbidity: h/o adhesive capsulitis, known spurring are also affecting patient's functional outcome.   REHAB POTENTIAL: Good  CLINICAL DECISION MAKING: Evolving/moderate complexity  EVALUATION COMPLEXITY: Moderate   GOALS:   SHORT TERM GOALS:  1.  Independent with stretching program while maintaining pain levels <=3/10 Baseline:  Goal status: 9/25 achieved at this point and will continue to progress   LONG TERM GOALS: Target date:POC date  1.  Bil UE ROM within 10 deg of opp UE Baseline:  Goal status: ONGOING with Flexion within 2 degrees, and abduction with continued deficits.   2.  UE MMT 90% Rt to Lt Baseline:  Goal status: INITIAL  3.  Able to lift dishes to mid and high shelves without incr in pain Baseline:  Goal status:  INITIAL   PLAN: PT FREQUENCY: 1x/week  PT DURATION: 12 weeks   PLANNED INTERVENTIONS: Therapeutic exercises, Therapeutic activity, Neuromuscular re-education, Patient/Family education, Joint mobilization, Joint manipulation, Orthotic/Fit training, DME instructions, Aquatic Therapy, Dry Needling, Electrical stimulation, Spinal manipulation, Spinal mobilization, Cryotherapy, Moist heat, Compression bandaging, Taping, Traction, Ultrasound, Ionotophoresis '4mg'$ /ml Dexamethasone, Manual therapy, and Re-evaluation  PLAN FOR NEXT SESSION:  shoulder ROM, periscap stability  Rudi Heap PT, DPT 08/23/22  11:21 AM

## 2022-08-09 ENCOUNTER — Encounter (HOSPITAL_BASED_OUTPATIENT_CLINIC_OR_DEPARTMENT_OTHER): Payer: Self-pay | Admitting: Physical Therapy

## 2022-08-09 ENCOUNTER — Ambulatory Visit (HOSPITAL_BASED_OUTPATIENT_CLINIC_OR_DEPARTMENT_OTHER): Payer: Medicare PPO | Admitting: Physical Therapy

## 2022-08-09 DIAGNOSIS — M25511 Pain in right shoulder: Secondary | ICD-10-CM | POA: Diagnosis not present

## 2022-08-09 DIAGNOSIS — G8929 Other chronic pain: Secondary | ICD-10-CM

## 2022-08-12 NOTE — Therapy (Addendum)
OUTPATIENT PHYSICAL THERAPY Shoulder TREATMENT   Patient Name: Alison Best MRN: [id number] DOB:[birth year], 66 y.o., female Today's Date: 08/16/2022   PT End of Session - 08/16/22 1148     Visit Number 14    Number of Visits 22    Date for PT Re-Evaluation 09/24/22    Authorization Type [employer]    PT Start Time 77    PT Stop Time 1150    PT Time Calculation (min) 48 min                 Past Medical History:  Diagnosis Date   Anal fissure    Arthritis    Bursitis    Constipation    COPD (chronic obstructive pulmonary disease) ([name])    Diverticulitis of colon 02/2015   Ectopic pregnancy    Emphysema of lung ([place])    Frozen shoulder    Heart murmur    Hernia, abdominal    Hyperlipidemia    Hyperthyroidism    Infertility, female    Irregular heartbeat    Joint pain    Obesity    Osteoarthritis    Pneumonia    Pre-diabetes    Sleep apnea    Swallowing difficulty    Thyroid disease    hyperthyroid   Past Surgical History:  Procedure Laterality Date   APPENDECTOMY     CESAREAN SECTION     COLONOSCOPY  09/10/2010   per Dr. Acquanetta Sit, diverticulosis of descending colon and sigmoid, internal hemorrhoids, repeat in 10 yrs   COLOSTOMY N/A 02/20/2015   Procedure: COLOSTOMY;  Surgeon: Jackolyn Confer, MD;  [facility];  Service: General;  Laterality: N/A;   COLOSTOMY REVISION  02/20/2015   Procedure: Sigmoid Colectomy;  Surgeon: Jackolyn Confer, MD;  Location: [facility];  Service: General;;   CYSTOSCOPY N/A 02/20/2015   Procedure: Consuela Mimes;  Surgeon: Kathie Rhodes, MD;  [facility];  Service: Urology;  Laterality: N/A;   DILATION AND CURETTAGE OF UTERUS     x3   ECTOPIC PREGNANCY SURGERY     ILEO LOOP COLOSTOMY CLOSURE N/A 09/26/2015   Procedure: LAPAROSCOPIC LYSIS OF ADHESIONS, COLOSTOMY CLOSURE;  Surgeon: Jackolyn Confer, MD;  [facility];  Service: General;  Laterality: N/A;   LAPAROTOMY N/A 02/20/2015   Procedure: Emergency  EXPLORATORY and drainiage of interabdominal abcesses;  Surgeon: Jackolyn Confer, MD;  [facility];  Service: General;  Laterality: N/A;   SALPINGOOPHORECTOMY Left 02/20/2015   Procedure: SALPINGO OOPHORECTOMY;  Surgeon: Jackolyn Confer, MD;  [facility];  Service: General;  Laterality: Left;   [place] Left 02/23/2022   Procedure: TOTAL HIP ARTHROPLASTY ANTERIOR APPROACH;  Surgeon: Paralee Cancel, MD;  [facility];  Service: Orthopedics;  Laterality: Left;   Patient Active Problem List   Diagnosis Date Noted   Vitamin D deficiency 03/26/2022   S/P total left hip arthroplasty 02/23/2022   Adjustment disorder with depressed mood 11/18/2021   COVID-19 virus infection 07/07/2021   Psoriasis 01/15/2021   Chronic pain of left knee 01/15/2021   Ventral hernia without obstruction or gangrene 01/15/2021   Class 3 severe obesity with serious comorbidity and body mass index (BMI) of 40.0 to 44.9 in adult [facility]) 01/15/2019   Acne rosacea 05/10/2016   Multinodular goiter 02/27/2016   Subclinical thyrotoxicosis 02/26/2016   Colostomy in place [facility]) s/p colostomy reversal 09/26/15 09/26/2015   Prediabetes 07/30/2015   Perforation of sigmoid colon ([place]) 02/20/2015   Diverticulitis of large  intestine with abscess without bleeding 02/18/2015   OSA (obstructive sleep apnea) 05/09/2013   GASTROENTERITIS 08/18/2010   RAYNAUD'S SYNDROME 02/17/2009   PLANTAR FASCIITIS 02/17/2009   Asthma 06/12/2008   Hyperlipidemia 09/04/2007   DEVIATED SEPTUM 09/04/2007   ALLERGIC RHINITIS 09/04/2007   GERD 09/04/2007   OSTEOARTHRITIS 09/04/2007   HEADACHE 09/04/2007    PCP:  Laurey Morale, MD  REFERRING PROVIDER: Paralee Cancel, MD  REFERRING DIAG:  SHOULDER: M75.02 (ICD-10-CM) - Adhesive capsulitis of left shoulder  THERAPY DIAG:  Chronic left shoulder pain  Rationale for Evaluation and Treatment Rehabilitation  ONSET DATE: 02/23/22  SUBJECTIVE:    SUBJECTIVE STATEMENT: Pt states that she got the second shingles shot last week. She reports getting it in her L arm which resulted in a lot of pain. She found a lot of relief from using magnesium butter.   PERTINENT HISTORY: Abdominal hernia, smoker, COPD, A-Fib, 01/2015 colon resection  PAIN:  Are you having pain? Yes: NPRS scale: 1/10 Pain location: bil shoulder Lt 2/10 >Rt 0/10 Pain description: can be sharp Aggravating factors: reaching into cabinet Relieving factors: rest   FALLS:  Has patient fallen in last 6 months? No  LIVING ENVIRONMENT: Lives with: lives with their family Lives in: House/apartment Stairs: level home, 5 steps to enter Has following equipment at home: Single point cane and Environmental consultant - 4 wheeled  OCCUPATION: retired  PLOF: Independent with basic ADLs  PATIENT GOALS : Hip: return to walking and exercise; pt has a international trip coming up in March of 2024 and would like to be able to walk Shoulder: reaching overhead/at eye level, sleep on left side   OBJECTIVE:   DIAGNOSTIC FINDINGS:   IMPRESSION: Intraoperative images during left total hip arthroplasty. Normal alignment.  PATIENT SURVEYS:  FOTO shoulder 48  POSTURE: able to demo upright posture through the hip  LOWER EXTREMITY ROM:  WFL without incr in pain  LOWER EXTREMITY MMT:  07/05/22: gross 5/5 with exception of Lt hip flexion limited due to hernia   UPPER EXTREMITY MMT:  MMT Right eval Left eval L/R  Shoulder flexion- at elbow 22.4 11.3 Shoulder ROM at GHJ - 138/140   Shoulder extension     Shoulder abduction- at elbow 16.8 10.1 Shoulder ROM at GHJ - 108/ 145  Shoulder adduction     Shoulder extension     Shoulder internal rotation   C6  Shoulder external rotation   T12  Grip strength 30 30    (Blank rows = not tested)  UPPER EXTREMITY ROM: Lt UE 75% compared to Rt UE  GAIT: 9/11: mild vaulting over Lt LE   TODAY'S TREATMENT: Treatment                             08/16/22:  MANUAL: STM subscap & upper trap; Grade 4 mobs at 90 abd AP, Grade 4 inf mobs at end range flexion  Supine shoulder flexion 3lb bar Supine serratus punches 3lb Wall rolls up wall with physioball  Wall slides up wall for HEP Wall circles CW, CCW Wall push up- elbows wide Rows 2x10 with RTB Bilat ER with RTB 2x10    Treatment                            08/09/22:  Updated ROM measurements   MANUAL: STM subscap & upper trap; Grade 4 mobs at 90 abd AP, Grade 4  inf mobs at end range flexion  Supine shoulder flexion 3lb bar Supine serratus punches 2lb Scaption stretch roll on physioball Flexion stretch on physioball- lat press down on return Wall push up- elbows wide Rows 2x10 with RTB Bilat ER with RTB 2x10   Treatment                            07/26/22:  MANUAL: STM subscap & upper trap, subscap stretching with passive flexion; Grade 4 mobs at 90 abd AP, Grade 4 inf mobs at end range flexion  Supine shoulder flexion 3lb bar Supine serratus punches 2lb Scaption stretch roll on physioball Flexion stretch on physioball- lat press down on return Wall push up- elbows wide Pendulum hang 2 lb   Treatment                            07/19/22: Trigger Point Dry Needling, Manual Therapy Treatment:  Initial or subsequent education regarding Trigger Point Dry Needling: Initial Did patient give consent to treatment with Trigger Point Dry Needling: Yes TPDN with skilled palpation and monitoring followed by STM to the following muscles: Lt upper trap  Prone rib mobility on Left side Pulleys- flexion Fwd punch with adduction resistance red tband Bil shoulder flexion- abd pull on red tband Wall walk with liftoff      PATIENT EDUCATION:  Education details: Geophysicist/field seismologist of condition, POC, HEP, exercise form/rationale Person educated: Patient Education method: Explanation, Demonstration, Tactile cues, Verbal cues, and Handouts Education comprehension: verbalized  understanding, returned demonstration, verbal cues required, and tactile cues required   HOME EXERCISE PROGRAM: Access Code:  [email or web address] - hip [email or web address] - shoulder   ASSESSMENT:  CLINICAL IMPRESSION:          Pt continues to present to PT with tension in L UT, subscap that improves with STM. She demonstrates improved ROM and functional mobility today. Pt requires minimal cueing throughout session, and increased motivational encouragement. Challenged pt with OH flexion exercises today and wall circles to encourage increased mobility and co -contraction of GHJ muscles. Pt will continue to benefit from skilled PT to address continued deficits.     HIP: OBJECTIVE IMPAIRMENTS Abnormal gait, cardiopulmonary status limiting activity, decreased activity tolerance, decreased balance, decreased endurance, decreased mobility, difficulty walking, decreased ROM, decreased strength, hypomobility, increased muscle spasms, impaired flexibility, improper body mechanics, postural dysfunction, and pain.   ACTIVITY LIMITATIONS carrying, lifting, bending, standing, squatting, stairs, transfers, bed mobility, bathing, toileting, dressing, and locomotion level  PARTICIPATION LIMITATIONS: meal prep, cleaning, laundry, interpersonal relationship, driving, shopping, community activity, yard work, and exercise  PERSONAL FACTORS Age, Fitness, Past/current experiences, Time since onset of injury/illness/exacerbation, and 3+ comorbidities:    are also affecting patient's functional outcome.   REHAB POTENTIAL: Good  CLINICAL DECISION MAKING: Evolving/moderate complexity  EVALUATION COMPLEXITY: Moderate  Shoulder: OBJECTIVE IMPAIRMENTS decreased activity tolerance, decreased ROM, decreased strength, increased muscle spasms, impaired flexibility, impaired UE functional use, improper body mechanics, postural dysfunction, and pain.   ACTIVITY LIMITATIONS carrying, lifting, and reach over head  PARTICIPATION  LIMITATIONS: meal prep, cleaning, and laundry  PERSONAL FACTORS 1 comorbidity: h/o adhesive capsulitis, known spurring are also affecting patient's functional outcome.   REHAB POTENTIAL: Good  CLINICAL DECISION MAKING: Evolving/moderate complexity  EVALUATION COMPLEXITY: Moderate   GOALS:   SHORT TERM GOALS:  1.  Independent with stretching program while maintaining pain levels <=3/10 Baseline:  Goal status: 9/25 achieved at this  point and will continue to progress   LONG TERM GOALS: Target date:POC date  1.  Bil UE ROM within 10 deg of opp UE Baseline:  Goal status: ONGOING with Flexion within 2 degrees, and abduction with continued deficits.   2.  UE MMT 90% Rt to Lt Baseline:  Goal status: INITIAL  3.  Able to lift dishes to mid and high shelves without incr in pain Baseline:  Goal status: INITIAL   PLAN: PT FREQUENCY: 1x/week  PT DURATION: 12 weeks   PLANNED INTERVENTIONS: Therapeutic exercises, Therapeutic activity, Neuromuscular re-education, Patient/Family education, Joint mobilization, Joint manipulation, Orthotic/Fit training, DME instructions, Aquatic Therapy, Dry Needling, Electrical stimulation, Spinal manipulation, Spinal mobilization, Cryotherapy, Moist heat, Compression bandaging, Taping, Traction, Ultrasound, Ionotophoresis '4mg'$ /ml Dexamethasone, Manual therapy, and Re-evaluation  PLAN FOR NEXT SESSION:  shoulder ROM, periscap stability  Rudi Heap PT, DPT 08/16/22  12:39 PM  Referring diagnosis? SHOULDER: M75.02 (ICD-10-CM) - Adhesive capsulitis of left shoulder Treatment diagnosis? (if different than referring diagnosis) M25.512 What was this (referring dx) caused by? '[]'$  Surgery '[]'$  Fall '[x]'$  Ongoing issue '[]'$  Arthritis '[]'$  Other: ____________  Laterality: '[]'$  Rt '[x]'$  Lt '[x]'$  Both  Check all possible CPT codes:  *CHOOSE 10 OR LESS*    '[]'$  97110 (Therapeutic Exercise)  '[]'$  92507 (SLP Treatment)  '[]'$  97112 (Neuro Re-ed)   '[]'$  92526 (Swallowing  Treatment)   '[]'$  97116 (Gait Training)   '[]'$  [id number] (Cognitive Training, 1st 15 minutes) '[]'$  97140 (Manual Therapy)   '[]'$  97130 (Cognitive Training, each add'l 15 minutes)  '[]'$  97164 (Re-evaluation)                              '[]'$  Other, List CPT Code ____________  '[]'$  84166 (Therapeutic Activities)     '[]'$  97535 (Self Care)   '[x]'$  All codes above (97110 - 97535)  '[]'$  97012 (Mechanical Traction)  '[]'$  97014 (E-stim Unattended)  '[]'$  97032 (E-stim manual)  '[x]'$  97033 (Ionto)  '[]'$  97035 (Ultrasound) '[x]'$  97750 (Physical Performance Training) '[x]'$  [id number] (Aquatic Therapy) '[]'$  97016 (Vasopneumatic Device) '[]'$  [id number] (Paraffin) '[]'$  97034 (Contrast Bath) '[]'$  97597 (Wound Care 1st 20 sq cm) '[]'$  97598 (Wound Care each add'l 20 sq cm) '[]'$  97760 (Orthotic Fabrication, Fitting, Training Initial) '[]'$  [id number] (Prosthetic Management and Training Initial) '[]'$  [id number] (Orthotic or Prosthetic Training/ Modification Subsequent) Jessica C. Hightower PT, DPT 08/20/22 1:50 PM

## 2022-08-16 ENCOUNTER — Ambulatory Visit (HOSPITAL_BASED_OUTPATIENT_CLINIC_OR_DEPARTMENT_OTHER): Payer: Medicare PPO | Admitting: Physical Therapy

## 2022-08-16 ENCOUNTER — Encounter (HOSPITAL_BASED_OUTPATIENT_CLINIC_OR_DEPARTMENT_OTHER): Payer: Self-pay | Admitting: Physical Therapy

## 2022-08-16 DIAGNOSIS — G8929 Other chronic pain: Secondary | ICD-10-CM

## 2022-08-16 DIAGNOSIS — M25511 Pain in right shoulder: Secondary | ICD-10-CM | POA: Diagnosis not present

## 2022-08-20 NOTE — Therapy (Unsigned)
OUTPATIENT PHYSICAL THERAPY Shoulder evaluation   Patient Name: Alison Best MRN: [id number] DOB:January 26, 1956, 66 y.o., female Today's Date: 08/23/2022   PT End of Session - 08/23/22 1123     Visit Number 15    Number of Visits 22    Date for PT Re-Evaluation 09/24/22    Authorization Type BCBS    PT Start Time 1102    PT Stop Time 1145    PT Time Calculation (min) 43 min    Activity Tolerance Patient tolerated treatment well    Behavior During Therapy WFL for tasks assessed/performed              Past Medical History:  Diagnosis Date   Anal fissure    Arthritis    Bursitis    Constipation    COPD (chronic obstructive pulmonary disease) ([place])    Diverticulitis of colon 02/2015   Ectopic pregnancy    Emphysema of lung ([place])    Frozen shoulder    Heart murmur    Hernia, abdominal    Hyperlipidemia    Hyperthyroidism    Infertility, female    Irregular heartbeat    Joint pain    Obesity    Osteoarthritis    Pneumonia    Pre-diabetes    Sleep apnea    Swallowing difficulty    Thyroid disease    hyperthyroid   Past Surgical History:  Procedure Laterality Date   APPENDECTOMY     CESAREAN SECTION     COLONOSCOPY  09/10/2010   per Dr. Acquanetta Sit, diverticulosis of descending colon and sigmoid, internal hemorrhoids, repeat in 10 yrs   COLOSTOMY N/A 02/20/2015   Procedure: COLOSTOMY;  Surgeon: Jackolyn Confer, MD;  Location: [facility];  Service: General;  Laterality: N/A;   COLOSTOMY REVISION  02/20/2015   Procedure: Sigmoid Colectomy;  Surgeon: Jackolyn Confer, MD;  [facility];  Service: General;;   CYSTOSCOPY N/A 02/20/2015   Procedure: Consuela Mimes;  Surgeon: Kathie Rhodes, MD;  [facility];  Service: Urology;  Laterality: N/A;   DILATION AND CURETTAGE OF UTERUS     x3   ECTOPIC PREGNANCY SURGERY     ILEO LOOP COLOSTOMY CLOSURE N/A 09/26/2015   Procedure: LAPAROSCOPIC LYSIS OF ADHESIONS, COLOSTOMY CLOSURE;  Surgeon: Jackolyn Confer, MD;   [facility];  Service: General;  Laterality: N/A;   LAPAROTOMY N/A 02/20/2015   Procedure: Emergency EXPLORATORY and drainiage of interabdominal abcesses;  Surgeon: Jackolyn Confer, MD;  [facility];  Service: General;  Laterality: N/A;   SALPINGOOPHORECTOMY Left 02/20/2015   Procedure: SALPINGO OOPHORECTOMY;  Surgeon: Jackolyn Confer, MD;  [facility];  Service: General;  Laterality: Left;   [place] Left 02/23/2022   Procedure: TOTAL HIP ARTHROPLASTY ANTERIOR APPROACH;  Surgeon: Paralee Cancel, MD;  [facility];  Service: Orthopedics;  Laterality: Left;   Patient Active Problem List   Diagnosis Date Noted   Vitamin D deficiency 03/26/2022   S/P total left hip arthroplasty 02/23/2022   Adjustment disorder with depressed mood 11/18/2021   COVID-19 virus infection 07/07/2021   Psoriasis 01/15/2021   Chronic pain of left knee 01/15/2021   Ventral hernia without obstruction or gangrene 01/15/2021   Class 3 severe obesity with serious comorbidity and body mass index (BMI) of 40.0 to 44.9 in adult [facility]) 01/15/2019   Acne rosacea 05/10/2016   Multinodular goiter 02/27/2016   Subclinical thyrotoxicosis 02/26/2016   Colostomy in [facility]) s/p colostomy reversal 09/26/15 09/26/2015  Prediabetes 07/30/2015   Perforation of sigmoid colon ([place]) 02/20/2015   Diverticulitis of large intestine with abscess without bleeding 02/18/2015   OSA (obstructive sleep apnea) 05/09/2013   GASTROENTERITIS 08/18/2010   RAYNAUD'S SYNDROME 02/17/2009   PLANTAR FASCIITIS 02/17/2009   Asthma 06/12/2008   Hyperlipidemia 09/04/2007   DEVIATED SEPTUM 09/04/2007   ALLERGIC RHINITIS 09/04/2007   GERD 09/04/2007   OSTEOARTHRITIS 09/04/2007   HEADACHE 09/04/2007    PCP:  Laurey Morale, MD  REFERRING PROVIDER: Paralee Cancel, MD  REFERRING DIAG:  SHOULDER: M75.02 (ICD-10-CM) - Adhesive capsulitis of left shoulder  THERAPY DIAG:  Chronic left shoulder  pain  Rationale for Evaluation and Treatment Rehabilitation  ONSET DATE: 02/23/22  SUBJECTIVE:   SUBJECTIVE STATEMENT: Pt states that she is noting improvement in her shoulder mobility. She states that she is now able to do more with "no pain with Opal Sidles in the pool".   PERTINENT HISTORY: Abdominal hernia, smoker, COPD, A-Fib, 01/2015 colon resection  PAIN:  Are you having pain? Yes: NPRS scale: 1/10 Pain location: bil shoulder Lt 2/10 >Rt 0/10 Pain description: can be sharp Aggravating factors: reaching into cabinet Relieving factors: rest   FALLS:  Has patient fallen in last 6 months? No  LIVING ENVIRONMENT: Lives with: lives with their family Lives in: House/apartment Stairs: level home, 5 steps to enter Has following equipment at home: Single point cane and Environmental consultant - 4 wheeled  OCCUPATION: retired  PLOF: Independent with basic ADLs  PATIENT GOALS : Hip: return to walking and exercise; pt has a international trip coming up in March of 2024 and would like to be able to walk Shoulder: reaching overhead/at eye level, sleep on left side   OBJECTIVE:   DIAGNOSTIC FINDINGS:   IMPRESSION: Intraoperative images during left total hip arthroplasty. Normal alignment.  PATIENT SURVEYS:  FOTO shoulder 48  POSTURE: able to demo upright posture through the hip  LOWER EXTREMITY ROM:  WFL without incr in pain  LOWER EXTREMITY MMT:  07/05/22: gross 5/5 with exception of Lt hip flexion limited due to hernia   UPPER EXTREMITY MMT:  MMT Right eval Left eval L/R  Shoulder flexion- at elbow 22.4 11.3 Shoulder ROM at GHJ - 138/140   Shoulder extension     Shoulder abduction- at elbow 16.8 10.1 Shoulder ROM at GHJ - 108/ 145  Shoulder adduction     Shoulder extension     Shoulder internal rotation   C6  Shoulder external rotation   T12  Grip strength 30 30    (Blank rows = not tested)  UPPER EXTREMITY ROM: Lt UE 75% compared to Rt UE  GAIT: 9/11: mild vaulting over  Lt LE   TODAY'S TREATMENT:  Treatment                            08/23/22:  MANUAL: STM subscap & upper trap; Grade 4 mobs at 90 abd AP, Grade 4 inf mobs at end range flexion  Supine shoulder flexion 3lb bar (reaching 165 degrees) Supine serratus punches 3lb Wall rolls up wall with physioball  Wall slides up wall for HEP Wall circles CW, CCW Wall push up- elbows wide Rows 2x10 with RTB Bilat ER with RTB 2x10   Treatment                            08/16/22:  MANUAL: STM subscap & upper trap; Grade 4 mobs  at 58 abd AP, Grade 4 inf mobs at end range flexion  Supine shoulder flexion 3lb bar Supine serratus punches 3lb Wall rolls up wall with physioball  Wall slides up wall for HEP Wall circles CW, CCW Wall push up- elbows wide Rows 2x10 with RTB Bilat ER with RTB 2x10    Treatment                            08/09/22:  Updated ROM measurements   MANUAL: STM subscap & upper trap; Grade 4 mobs at 90 abd AP, Grade 4 inf mobs at end range flexion  Supine shoulder flexion 3lb bar Supine serratus punches 2lb Scaption stretch roll on physioball Flexion stretch on physioball- lat press down on return Wall push up- elbows wide Rows 2x10 with RTB Bilat ER with RTB 2x10   Treatment                            07/26/22:  MANUAL: STM subscap & upper trap, subscap stretching with passive flexion; Grade 4 mobs at 90 abd AP, Grade 4 inf mobs at end range flexion  Supine shoulder flexion 3lb bar Supine serratus punches 2lb Scaption stretch roll on physioball Flexion stretch on physioball- lat press down on return Wall push up- elbows wide Pendulum hang 2 lb   PATIENT EDUCATION:  Education details: Geophysicist/field seismologist of condition, POC, HEP, exercise form/rationale Person educated: Patient Education method: Explanation, Demonstration, Tactile cues, Verbal cues, and Handouts Education comprehension: verbalized understanding, returned demonstration, verbal cues required, and tactile cues  required   HOME EXERCISE PROGRAM: Access Code:  [email or web address] - hip [email or web address] - shoulder   ASSESSMENT:  CLINICAL IMPRESSION: Improvements noted with L shoulder mobility. Pt able to reach 165 degrees with supine OH flexion. She also demonstrates improvement with IR/ ER today. Mild pain only noted with wall circles today due to reported "popping/ clicking". Pt was informed that happy joint make noises too and it is probably just adhesions breaking up. Updated HEP with strengthening exercises and provided pt with red theraband for use at home. Pt will continue to benefit from skilled PT to address continued deficits.     HIP: OBJECTIVE IMPAIRMENTS Abnormal gait, cardiopulmonary status limiting activity, decreased activity tolerance, decreased balance, decreased endurance, decreased mobility, difficulty walking, decreased ROM, decreased strength, hypomobility, increased muscle spasms, impaired flexibility, improper body mechanics, postural dysfunction, and pain.   ACTIVITY LIMITATIONS carrying, lifting, bending, standing, squatting, stairs, transfers, bed mobility, bathing, toileting, dressing, and locomotion level  PARTICIPATION LIMITATIONS: meal prep, cleaning, laundry, interpersonal relationship, driving, shopping, community activity, yard work, and exercise  PERSONAL FACTORS Age, Fitness, Past/current experiences, Time since onset of injury/illness/exacerbation, and 3+ comorbidities:    are also affecting patient's functional outcome.   REHAB POTENTIAL: Good  CLINICAL DECISION MAKING: Evolving/moderate complexity  EVALUATION COMPLEXITY: Moderate  Shoulder: OBJECTIVE IMPAIRMENTS decreased activity tolerance, decreased ROM, decreased strength, increased muscle spasms, impaired flexibility, impaired UE functional use, improper body mechanics, postural dysfunction, and pain.   ACTIVITY LIMITATIONS carrying, lifting, and reach over head  PARTICIPATION LIMITATIONS: meal prep, cleaning, and  laundry  PERSONAL FACTORS 1 comorbidity: h/o adhesive capsulitis, known spurring are also affecting patient's functional outcome.   REHAB POTENTIAL: Good  CLINICAL DECISION MAKING: Evolving/moderate complexity  EVALUATION COMPLEXITY: Moderate   GOALS:   SHORT TERM GOALS:  1.  Independent with stretching program while maintaining pain levels <=3/10 Baseline:  Goal status:  9/25 achieved at this point and will continue to progress   LONG TERM GOALS: Target date:POC date  1.  Bil UE ROM within 10 deg of opp UE Baseline:  Goal status: ONGOING with Flexion within 2 degrees, and abduction with continued deficits.   2.  UE MMT 90% Rt to Lt Baseline:  Goal status: INITIAL  3.  Able to lift dishes to mid and high shelves without incr in pain Baseline:  Goal status: INITIAL   PLAN: PT FREQUENCY: 1x/week  PT DURATION: 12 weeks   PLANNED INTERVENTIONS: Therapeutic exercises, Therapeutic activity, Neuromuscular re-education, Patient/Family education, Joint mobilization, Joint manipulation, Orthotic/Fit training, DME instructions, Aquatic Therapy, Dry Needling, Electrical stimulation, Spinal manipulation, Spinal mobilization, Cryotherapy, Moist heat, Compression bandaging, Taping, Traction, Ultrasound, Ionotophoresis '4mg'$ /ml Dexamethasone, Manual therapy, and Re-evaluation  PLAN FOR NEXT SESSION:  Parascapular strengthening.   Rudi Heap PT, DPT 08/23/22  11:25 AM

## 2022-08-23 ENCOUNTER — Encounter (HOSPITAL_BASED_OUTPATIENT_CLINIC_OR_DEPARTMENT_OTHER): Payer: Self-pay | Admitting: Physical Therapy

## 2022-08-23 ENCOUNTER — Ambulatory Visit (HOSPITAL_BASED_OUTPATIENT_CLINIC_OR_DEPARTMENT_OTHER): Payer: Medicare PPO | Admitting: Physical Therapy

## 2022-08-23 DIAGNOSIS — M25511 Pain in right shoulder: Secondary | ICD-10-CM | POA: Diagnosis not present

## 2022-08-23 DIAGNOSIS — G8929 Other chronic pain: Secondary | ICD-10-CM

## 2022-08-30 ENCOUNTER — Ambulatory Visit (HOSPITAL_BASED_OUTPATIENT_CLINIC_OR_DEPARTMENT_OTHER): Payer: Medicare PPO | Attending: Orthopedic Surgery | Admitting: Physical Therapy

## 2022-08-30 DIAGNOSIS — G8929 Other chronic pain: Secondary | ICD-10-CM | POA: Diagnosis present

## 2022-08-30 DIAGNOSIS — M25512 Pain in left shoulder: Secondary | ICD-10-CM | POA: Diagnosis not present

## 2022-08-30 DIAGNOSIS — M25511 Pain in right shoulder: Secondary | ICD-10-CM | POA: Insufficient documentation

## 2022-08-30 NOTE — Therapy (Signed)
OUTPATIENT PHYSICAL THERAPY Shoulder evaluation   Patient Name: Alison Best MRN: [id number] DOB:[birth year], 66 y.o., female Today's Date: 08/30/2022   PT End of Session - 08/30/22 1020     Visit Number 16    Number of Visits 22    Date for PT Re-Evaluation 09/24/22    Authorization Type BCBS    PT Start Time 1018    PT Stop Time 1101    PT Time Calculation (min) 43 min    Activity Tolerance Patient tolerated treatment well    Behavior During Therapy WFL for tasks assessed/performed               Past Medical History:  Diagnosis Date   Anal fissure    Arthritis    Bursitis    Constipation    COPD (chronic obstructive pulmonary disease) ([place])    Diverticulitis of colon 02/2015   Ectopic pregnancy    Emphysema of lung ([place])    Frozen shoulder    Heart murmur    Hernia, abdominal    Hyperlipidemia    Hyperthyroidism    Infertility, female    Irregular heartbeat    Joint pain    Obesity    Osteoarthritis    Pneumonia    Pre-diabetes    Sleep apnea    Swallowing difficulty    Thyroid disease    hyperthyroid   Past Surgical History:  Procedure Laterality Date   APPENDECTOMY     CESAREAN SECTION     COLONOSCOPY  09/10/2010   per Dr. Acquanetta Sit, diverticulosis of descending colon and sigmoid, internal hemorrhoids, repeat in 10 yrs   COLOSTOMY N/A 02/20/2015   Procedure: COLOSTOMY;  Surgeon: Jackolyn Confer, MD;  [facility];  Service: General;  Laterality: N/A;   COLOSTOMY REVISION  02/20/2015   Procedure: Sigmoid Colectomy;  Surgeon: Jackolyn Confer, MD;  [facility];  Service: General;;   CYSTOSCOPY N/A 02/20/2015   Procedure: Consuela Mimes;  Surgeon: Kathie Rhodes, MD;  [facility];  Service: Urology;  Laterality: N/A;   DILATION AND CURETTAGE OF UTERUS     x3   ECTOPIC PREGNANCY SURGERY     ILEO LOOP COLOSTOMY CLOSURE N/A 09/26/2015   Procedure: LAPAROSCOPIC LYSIS OF ADHESIONS, COLOSTOMY CLOSURE;  Surgeon: Jackolyn Confer, MD;   [facility];  Service: General;  Laterality: N/A;   LAPAROTOMY N/A 02/20/2015   Procedure: Emergency EXPLORATORY and drainiage of interabdominal abcesses;  Surgeon: Jackolyn Confer, MD;  [facility];  Service: General;  Laterality: N/A;   SALPINGOOPHORECTOMY Left 02/20/2015   Procedure: SALPINGO OOPHORECTOMY;  Surgeon: Jackolyn Confer, MD;  [facility];  Service: General;  Laterality: Left;   [place] Left 02/23/2022   Procedure: TOTAL HIP ARTHROPLASTY ANTERIOR APPROACH;  Surgeon: Paralee Cancel, MD;  [facility];  Service: Orthopedics;  Laterality: Left;   Patient Active Problem List   Diagnosis Date Noted   Vitamin D deficiency 03/26/2022   S/P total left hip arthroplasty 02/23/2022   Adjustment disorder with depressed mood 11/18/2021   COVID-19 virus infection 07/07/2021   Psoriasis 01/15/2021   Chronic pain of left knee 01/15/2021   Ventral hernia without obstruction or gangrene 01/15/2021   Class 3 severe obesity with serious comorbidity and body mass index (BMI) of 40.0 to 44.9 in adult [facility]) 01/15/2019   Acne rosacea 05/10/2016   Multinodular goiter 02/27/2016   Subclinical thyrotoxicosis 02/26/2016   Colostomy in place [facility]) s/p colostomy reversal 09/26/15 09/26/2015  Prediabetes 07/30/2015   Perforation of sigmoid colon ([place]) 02/20/2015   Diverticulitis of large intestine with abscess without bleeding 02/18/2015   OSA (obstructive sleep apnea) 05/09/2013   GASTROENTERITIS 08/18/2010   RAYNAUD'S SYNDROME 02/17/2009   PLANTAR FASCIITIS 02/17/2009   Asthma 06/12/2008   Hyperlipidemia 09/04/2007   DEVIATED SEPTUM 09/04/2007   ALLERGIC RHINITIS 09/04/2007   GERD 09/04/2007   OSTEOARTHRITIS 09/04/2007   HEADACHE 09/04/2007    PCP:  Laurey Morale, MD  REFERRING PROVIDER: Paralee Cancel, MD  REFERRING DIAG:  SHOULDER: M75.02 (ICD-10-CM) - Adhesive capsulitis of left shoulder  THERAPY DIAG:  Chronic left shoulder  pain  Chronic right shoulder pain  Rationale for Evaluation and Treatment Rehabilitation  ONSET DATE: 02/23/22  SUBJECTIVE:   SUBJECTIVE STATEMENT: Notice it the most when reaching for the high plates. Using cross body purse rather than heavy on one shoulder. I got a copper fit shirt that helps give reminder for shoulers back.   PERTINENT HISTORY: Abdominal hernia, smoker, COPD, A-Fib, 01/2015 colon resection  PAIN:  Are you having pain? Yes: NPRS scale: 1/10 Pain location: bil shoulder Lt 2/10 >Rt 0/10 Pain description: can be sharp Aggravating factors: reaching into cabinet Relieving factors: rest   FALLS:  Has patient fallen in last 6 months? No  LIVING ENVIRONMENT: Lives with: lives with their family Lives in: House/apartment Stairs: level home, 5 steps to enter Has following equipment at home: Single point cane and Environmental consultant - 4 wheeled  OCCUPATION: retired  PLOF: Independent with basic ADLs  PATIENT GOALS : Hip: return to walking and exercise; pt has a international trip coming up in March of 2024 and would like to be able to walk Shoulder: reaching overhead/at eye level, sleep on left side   OBJECTIVE:   DIAGNOSTIC FINDINGS:   IMPRESSION: Intraoperative images during left total hip arthroplasty. Normal alignment.  PATIENT SURVEYS:  FOTO shoulder 48  POSTURE: able to demo upright posture through the hip  LOWER EXTREMITY ROM:  WFL without incr in pain  LOWER EXTREMITY MMT:  07/05/22: gross 5/5 with exception of Lt hip flexion limited due to hernia   UPPER EXTREMITY MMT:  MMT Right eval Left eval L/R  Shoulder flexion- at elbow 22.4 11.3 Shoulder ROM at GHJ - 138/140   Shoulder extension     Shoulder abduction- at elbow 16.8 10.1 Shoulder ROM at GHJ - 108/ 145  Shoulder adduction     Shoulder extension     Shoulder internal rotation   C6  Shoulder external rotation   T12  Grip strength 30 30    (Blank rows = not tested)  UPPER EXTREMITY  ROM: Lt UE 75% compared to Rt UE  GAIT: 9/11: mild vaulting over Lt LE   TODAY'S TREATMENT:  Treatment                            08/30/22:  MANUAL: end range flexion inf GHJ mobs; flexion MWM for subscap trigger point; upper trap trigger point release Supine flexion with wand- able to achieve 144 Supine flexion + with wand Seated prayer to W   Treatment                            08/23/22:  MANUAL: STM subscap & upper trap; Grade 4 mobs at 90 abd AP, Grade 4 inf mobs at end range flexion  Supine shoulder flexion 3lb bar (reaching 165 degrees)  Supine serratus punches 3lb Wall rolls up wall with physioball  Wall slides up wall for HEP Wall circles CW, CCW Wall push up- elbows wide Rows 2x10 with RTB Bilat ER with RTB 2x10   Treatment                            08/16/22:  MANUAL: STM subscap & upper trap; Grade 4 mobs at 90 abd AP, Grade 4 inf mobs at end range flexion  Supine shoulder flexion 3lb bar Supine serratus punches 3lb Wall rolls up wall with physioball  Wall slides up wall for HEP Wall circles CW, CCW Wall push up- elbows wide Rows 2x10 with RTB Bilat ER with RTB 2x10      PATIENT EDUCATION:  Education details: Geophysicist/field seismologist of condition, POC, HEP, exercise form/rationale Person educated: Patient Education method: Explanation, Demonstration, Tactile cues, Verbal cues, and Handouts Education comprehension: verbalized understanding, returned demonstration, verbal cues required, and tactile cues required   HOME EXERCISE PROGRAM: Access Code:  [email or web address] - hip [email or web address] - shoulder   ASSESSMENT:  CLINICAL IMPRESSION: Discussion held at end of session regarding holistic approach and POC. I did refer her to [facility] in pharmacy for insight regarding pharmacology questions. Overall shoulder is progressing well. Flexion motion improved with release of subscap and correction of abd/IR compensation during OH flexion.   HIP: OBJECTIVE IMPAIRMENTS Abnormal gait,  cardiopulmonary status limiting activity, decreased activity tolerance, decreased balance, decreased endurance, decreased mobility, difficulty walking, decreased ROM, decreased strength, hypomobility, increased muscle spasms, impaired flexibility, improper body mechanics, postural dysfunction, and pain.   ACTIVITY LIMITATIONS carrying, lifting, bending, standing, squatting, stairs, transfers, bed mobility, bathing, toileting, dressing, and locomotion level  PARTICIPATION LIMITATIONS: meal prep, cleaning, laundry, interpersonal relationship, driving, shopping, community activity, yard work, and exercise  PERSONAL FACTORS Age, Fitness, Past/current experiences, Time since onset of injury/illness/exacerbation, and 3+ comorbidities:    are also affecting patient's functional outcome.   REHAB POTENTIAL: Good  CLINICAL DECISION MAKING: Evolving/moderate complexity  EVALUATION COMPLEXITY: Moderate  Shoulder: OBJECTIVE IMPAIRMENTS decreased activity tolerance, decreased ROM, decreased strength, increased muscle spasms, impaired flexibility, impaired UE functional use, improper body mechanics, postural dysfunction, and pain.   ACTIVITY LIMITATIONS carrying, lifting, and reach over head  PARTICIPATION LIMITATIONS: meal prep, cleaning, and laundry  PERSONAL FACTORS 1 comorbidity: h/o adhesive capsulitis, known spurring are also affecting patient's functional outcome.   REHAB POTENTIAL: Good  CLINICAL DECISION MAKING: Evolving/moderate complexity  EVALUATION COMPLEXITY: Moderate   GOALS:   SHORT TERM GOALS:  1.  Independent with stretching program while maintaining pain levels <=3/10 Baseline:  Goal status: 9/25 achieved at this point and will continue to progress   LONG TERM GOALS: Target date:POC date  1.  Bil UE ROM within 10 deg of opp UE Baseline:  Goal status: ONGOING with Flexion within 2 degrees, and abduction with continued deficits.   2.  UE MMT 90% Rt to Lt Baseline:   Goal status: INITIAL  3.  Able to lift dishes to mid and high shelves without incr in pain Baseline:  Goal status: INITIAL   PLAN: PT FREQUENCY: 1x/week  PT DURATION: 12 weeks   PLANNED INTERVENTIONS: Therapeutic exercises, Therapeutic activity, Neuromuscular re-education, Patient/Family education, Joint mobilization, Joint manipulation, Orthotic/Fit training, DME instructions, Aquatic Therapy, Dry Needling, Electrical stimulation, Spinal manipulation, Spinal mobilization, Cryotherapy, Moist heat, Compression bandaging, Taping, Traction, Ultrasound, Ionotophoresis '4mg'$ /ml Dexamethasone, Manual therapy, and Re-evaluation  PLAN FOR NEXT SESSION:  Parascapular strengthening.   Janett Billow  Margie Billet PT, DPT 08/30/22 12:08 PM

## 2022-09-06 ENCOUNTER — Ambulatory Visit (HOSPITAL_BASED_OUTPATIENT_CLINIC_OR_DEPARTMENT_OTHER): Payer: Medicare PPO | Admitting: Physical Therapy

## 2022-09-06 ENCOUNTER — Encounter (HOSPITAL_BASED_OUTPATIENT_CLINIC_OR_DEPARTMENT_OTHER): Payer: Self-pay | Admitting: Physical Therapy

## 2022-09-06 DIAGNOSIS — G8929 Other chronic pain: Secondary | ICD-10-CM

## 2022-09-06 DIAGNOSIS — M25512 Pain in left shoulder: Secondary | ICD-10-CM | POA: Diagnosis not present

## 2022-09-06 NOTE — Therapy (Signed)
OUTPATIENT PHYSICAL THERAPY Shoulder evaluation   Patient Name: Alison Best MRN: [id number] DOB:Feb 05, 1956, 66 y.o., female Today's Date: 09/06/2022   PT End of Session - 09/06/22 1147     Visit Number 17    Number of Visits 22    Date for PT Re-Evaluation 09/24/22    Authorization Type BCBS    PT Start Time 1145    PT Stop Time 1224    PT Time Calculation (min) 39 min    Activity Tolerance Patient tolerated treatment well    Behavior During Therapy WFL for tasks assessed/performed                Past Medical History:  Diagnosis Date   Anal fissure    Arthritis    Bursitis    Constipation    COPD (chronic obstructive pulmonary disease) ([place])    Diverticulitis of colon 02/2015   Ectopic pregnancy    Emphysema of lung ([place])    Frozen shoulder    Heart murmur    Hernia, abdominal    Hyperlipidemia    Hyperthyroidism    Infertility, female    Irregular heartbeat    Joint pain    Obesity    Osteoarthritis    Pneumonia    Pre-diabetes    Sleep apnea    Swallowing difficulty    Thyroid disease    hyperthyroid   Past Surgical History:  Procedure Laterality Date   APPENDECTOMY     CESAREAN SECTION     COLONOSCOPY  09/10/2010   per Dr. Acquanetta Sit, diverticulosis of descending colon and sigmoid, internal hemorrhoids, repeat in 10 yrs   COLOSTOMY N/A 02/20/2015   Procedure: COLOSTOMY;  Surgeon: Jackolyn Confer, MD;  Location: [facility];  Service: General;  Laterality: N/A;   COLOSTOMY REVISION  02/20/2015   Procedure: Sigmoid Colectomy;  Surgeon: Jackolyn Confer, MD;  Location: [facility];  Service: General;;   CYSTOSCOPY N/A 02/20/2015   Procedure: Consuela Mimes;  Surgeon: Kathie Rhodes, MD;  [facility];  Service: Urology;  Laterality: N/A;   DILATION AND CURETTAGE OF UTERUS     x3   ECTOPIC PREGNANCY SURGERY     ILEO LOOP COLOSTOMY CLOSURE N/A 09/26/2015   Procedure: LAPAROSCOPIC LYSIS OF ADHESIONS, COLOSTOMY CLOSURE;  Surgeon: Jackolyn Confer,  MD;  [facility];  Service: General;  Laterality: N/A;   LAPAROTOMY N/A 02/20/2015   Procedure: Emergency EXPLORATORY and drainiage of interabdominal abcesses;  Surgeon: Jackolyn Confer, MD;  [facility];  Service: General;  Laterality: N/A;   SALPINGOOPHORECTOMY Left 02/20/2015   Procedure: SALPINGO OOPHORECTOMY;  Surgeon: Jackolyn Confer, MD;  [facility];  Service: General;  Laterality: Left;   [place] Left 02/23/2022   Procedure: TOTAL HIP ARTHROPLASTY ANTERIOR APPROACH;  Surgeon: Paralee Cancel, MD;  [facility];  Service: Orthopedics;  Laterality: Left;   Patient Active Problem List   Diagnosis Date Noted   Vitamin D deficiency 03/26/2022   S/P total left hip arthroplasty 02/23/2022   Adjustment disorder with depressed mood 11/18/2021   COVID-19 virus infection 07/07/2021   Psoriasis 01/15/2021   Chronic pain of left knee 01/15/2021   Ventral hernia without obstruction or gangrene 01/15/2021   Class 3 severe obesity with serious comorbidity and body mass index (BMI) of 40.0 to 44.9 in adult [facility]) 01/15/2019   Acne rosacea 05/10/2016   Multinodular goiter 02/27/2016   Subclinical thyrotoxicosis 02/26/2016   Colostomy in place [facility]) s/p colostomy reversal 09/26/15  09/26/2015   Prediabetes 07/30/2015   Perforation of sigmoid colon ([place]) 02/20/2015   Diverticulitis of large intestine with abscess without bleeding 02/18/2015   OSA (obstructive sleep apnea) 05/09/2013   GASTROENTERITIS 08/18/2010   RAYNAUD'S SYNDROME 02/17/2009   PLANTAR FASCIITIS 02/17/2009   Asthma 06/12/2008   Hyperlipidemia 09/04/2007   DEVIATED SEPTUM 09/04/2007   ALLERGIC RHINITIS 09/04/2007   GERD 09/04/2007   OSTEOARTHRITIS 09/04/2007   HEADACHE 09/04/2007    PCP:  Laurey Morale, MD  REFERRING PROVIDER: Paralee Cancel, MD  REFERRING DIAG:  SHOULDER: M75.02 (ICD-10-CM) - Adhesive capsulitis of left shoulder  THERAPY DIAG:  Chronic left  shoulder pain  Chronic right shoulder pain  Rationale for Evaluation and Treatment Rehabilitation  ONSET DATE: 02/23/22  SUBJECTIVE:   SUBJECTIVE STATEMENT: Slipped in bathroom and caught myself in my left arm. Feeling okay since then.   PERTINENT HISTORY: Abdominal hernia, smoker, COPD, A-Fib, 01/2015 colon resection  PAIN:  Are you having pain? Yes: NPRS scale: 1/10 Pain location: bil shoulder Lt 2/10 >Rt 0/10 Pain description: can be sharp Aggravating factors: reaching into cabinet Relieving factors: rest   FALLS:  Has patient fallen in last 6 months? No  LIVING ENVIRONMENT: Lives with: lives with their family Lives in: House/apartment Stairs: level home, 5 steps to enter Has following equipment at home: Single point cane and Environmental consultant - 4 wheeled  OCCUPATION: retired  PLOF: Independent with basic ADLs  PATIENT GOALS : Hip: return to walking and exercise; pt has a international trip coming up in March of 2024 and would like to be able to walk Shoulder: reaching overhead/at eye level, sleep on left side   OBJECTIVE:   DIAGNOSTIC FINDINGS:   IMPRESSION: Intraoperative images during left total hip arthroplasty. Normal alignment.  PATIENT SURVEYS:  FOTO shoulder 48  POSTURE: able to demo upright posture through the hip  LOWER EXTREMITY ROM:  WFL without incr in pain  LOWER EXTREMITY MMT:  07/05/22: gross 5/5 with exception of Lt hip flexion limited due to hernia   UPPER EXTREMITY MMT:  MMT Right eval Left eval L/R ROM 10/16 Left 11/13  Shoulder flexion- at elbow 22.4 11.3 Shoulder ROM at GHJ - 138/140    Shoulder extension      Shoulder abduction- at elbow 16.8 10.1 Shoulder ROM at GHJ - 108/ 145 142  Shoulder adduction      Shoulder extension      Shoulder internal rotation   C6   Shoulder external rotation   T12   Grip strength 30 30     (Blank rows = not tested)  UPPER EXTREMITY ROM: Lt UE 75% compared to Rt UE  GAIT: 9/11: mild vaulting  over Lt LE   TODAY'S TREATMENT:  Treatment                            09/06/22:  Pulleys flexion- PT block scapula for subscap stretch; IR behind back Scaption wall slide stretch MANUAL: subscap STM with flexion stretching PT resisted UE D2 flexion/ext In mirror- 1lb each hand, OH reach flexion & scaption Bent over single UE ext in supination   Treatment                            08/30/22:  MANUAL: end range flexion inf GHJ mobs; flexion MWM for subscap trigger point; upper trap trigger point release Supine flexion with wand- able to achieve 144  Supine flexion + with wand Seated prayer to W   Treatment                            08/23/22:  MANUAL: STM subscap & upper trap; Grade 4 mobs at 90 abd AP, Grade 4 inf mobs at end range flexion  Supine shoulder flexion 3lb bar (reaching 165 degrees) Supine serratus punches 3lb Wall rolls up wall with physioball  Wall slides up wall for HEP Wall circles CW, CCW Wall push up- elbows wide Rows 2x10 with RTB Bilat ER with RTB 2x10    PATIENT EDUCATION:  Education details: Anatomy of condition, POC, HEP, exercise form/rationale Person educated: Patient Education method: Explanation, Demonstration, Tactile cues, Verbal cues, and Handouts Education comprehension: verbalized understanding, returned demonstration, verbal cues required, and tactile cues required   HOME EXERCISE PROGRAM: Access Code:  [email or web address] - hip [email or web address] - shoulder   ASSESSMENT:  CLINICAL IMPRESSION: Siginficant improvement in ROM following subscap release today. Used mirror for VC to reduce motion toward impingement in flexion.   HIP: OBJECTIVE IMPAIRMENTS Abnormal gait, cardiopulmonary status limiting activity, decreased activity tolerance, decreased balance, decreased endurance, decreased mobility, difficulty walking, decreased ROM, decreased strength, hypomobility, increased muscle spasms, impaired flexibility, improper body mechanics, postural  dysfunction, and pain.   ACTIVITY LIMITATIONS carrying, lifting, bending, standing, squatting, stairs, transfers, bed mobility, bathing, toileting, dressing, and locomotion level  PARTICIPATION LIMITATIONS: meal prep, cleaning, laundry, interpersonal relationship, driving, shopping, community activity, yard work, and exercise  PERSONAL FACTORS Age, Fitness, Past/current experiences, Time since onset of injury/illness/exacerbation, and 3+ comorbidities:    are also affecting patient's functional outcome.   REHAB POTENTIAL: Good  CLINICAL DECISION MAKING: Evolving/moderate complexity  EVALUATION COMPLEXITY: Moderate  Shoulder: OBJECTIVE IMPAIRMENTS decreased activity tolerance, decreased ROM, decreased strength, increased muscle spasms, impaired flexibility, impaired UE functional use, improper body mechanics, postural dysfunction, and pain.   ACTIVITY LIMITATIONS carrying, lifting, and reach over head  PARTICIPATION LIMITATIONS: meal prep, cleaning, and laundry  PERSONAL FACTORS 1 comorbidity: h/o adhesive capsulitis, known spurring are also affecting patient's functional outcome.   REHAB POTENTIAL: Good  CLINICAL DECISION MAKING: Evolving/moderate complexity  EVALUATION COMPLEXITY: Moderate   GOALS:   SHORT TERM GOALS:  1.  Independent with stretching program while maintaining pain levels <=3/10 Baseline:  Goal status: 9/25 achieved at this point and will continue to progress   LONG TERM GOALS: Target date:POC date  1.  Bil UE ROM within 10 deg of opp UE Baseline:  Goal status: ONGOING with Flexion within 2 degrees, and abduction with continued deficits.   2.  UE MMT 90% Rt to Lt Baseline:  Goal status: INITIAL  3.  Able to lift dishes to mid and high shelves without incr in pain Baseline:  Goal status: INITIAL   PLAN: PT FREQUENCY: 1x/week  PT DURATION: 12 weeks   PLANNED INTERVENTIONS: Therapeutic exercises, Therapeutic activity, Neuromuscular  re-education, Patient/Family education, Joint mobilization, Joint manipulation, Orthotic/Fit training, DME instructions, Aquatic Therapy, Dry Needling, Electrical stimulation, Spinal manipulation, Spinal mobilization, Cryotherapy, Moist heat, Compression bandaging, Taping, Traction, Ultrasound, Ionotophoresis '4mg'$ /ml Dexamethasone, Manual therapy, and Re-evaluation  PLAN FOR NEXT SESSION:  Parascapular strengthening.   Dannell Gortney C. Mccauley Diehl PT, DPT 09/06/22 12:28 PM

## 2022-09-12 ENCOUNTER — Encounter (HOSPITAL_BASED_OUTPATIENT_CLINIC_OR_DEPARTMENT_OTHER): Payer: Self-pay | Admitting: Physical Therapy

## 2022-09-13 ENCOUNTER — Ambulatory Visit (HOSPITAL_BASED_OUTPATIENT_CLINIC_OR_DEPARTMENT_OTHER): Payer: Medicare PPO | Admitting: Physical Therapy

## 2022-09-20 ENCOUNTER — Ambulatory Visit (HOSPITAL_BASED_OUTPATIENT_CLINIC_OR_DEPARTMENT_OTHER): Payer: Medicare PPO | Admitting: Physical Therapy

## 2022-09-21 ENCOUNTER — Encounter (HOSPITAL_BASED_OUTPATIENT_CLINIC_OR_DEPARTMENT_OTHER): Payer: Self-pay | Admitting: Physical Therapy

## 2022-09-21 ENCOUNTER — Ambulatory Visit (HOSPITAL_BASED_OUTPATIENT_CLINIC_OR_DEPARTMENT_OTHER): Payer: Medicare PPO | Admitting: Physical Therapy

## 2022-09-21 DIAGNOSIS — G8929 Other chronic pain: Secondary | ICD-10-CM

## 2022-09-21 DIAGNOSIS — M25512 Pain in left shoulder: Secondary | ICD-10-CM | POA: Diagnosis not present

## 2022-09-21 NOTE — Therapy (Signed)
OUTPATIENT PHYSICAL THERAPY Shoulder evaluation   Patient Name: Alison Best MRN: [id number] DOB:1956/09/14, 66 y.o., female Today's Date: 09/21/2022   PT End of Session - 09/21/22 1146     Visit Number 19    Number of Visits 22    Date for PT Re-Evaluation 09/24/22    Authorization Type BCBS                 Past Medical History:  Diagnosis Date   Anal fissure    Arthritis    Bursitis    Constipation    COPD (chronic obstructive pulmonary disease) ([place])    Diverticulitis of colon 02/2015   Ectopic pregnancy    Emphysema of lung ([place])    Frozen shoulder    Heart murmur    Hernia, abdominal    Hyperlipidemia    Hyperthyroidism    Infertility, female    Irregular heartbeat    Joint pain    Obesity    Osteoarthritis    Pneumonia    Pre-diabetes    Sleep apnea    Swallowing difficulty    Thyroid disease    hyperthyroid   Past Surgical History:  Procedure Laterality Date   APPENDECTOMY     CESAREAN SECTION     COLONOSCOPY  09/10/2010   per Dr. Acquanetta Sit, diverticulosis of descending colon and sigmoid, internal hemorrhoids, repeat in 10 yrs   COLOSTOMY N/A 02/20/2015   Procedure: COLOSTOMY;  Surgeon: Jackolyn Confer, MD;  [facility];  Service: General;  Laterality: N/A;   COLOSTOMY REVISION  02/20/2015   Procedure: Sigmoid Colectomy;  Surgeon: Jackolyn Confer, MD;  [facility];  Service: General;;   CYSTOSCOPY N/A 02/20/2015   Procedure: Consuela Mimes;  Surgeon: Kathie Rhodes, MD;  [facility];  Service: Urology;  Laterality: N/A;   DILATION AND CURETTAGE OF UTERUS     x3   ECTOPIC PREGNANCY SURGERY     ILEO LOOP COLOSTOMY CLOSURE N/A 09/26/2015   Procedure: LAPAROSCOPIC LYSIS OF ADHESIONS, COLOSTOMY CLOSURE;  Surgeon: Jackolyn Confer, MD;  [facility];  Service: General;  Laterality: N/A;   LAPAROTOMY N/A 02/20/2015   Procedure: Emergency EXPLORATORY and drainiage of interabdominal abcesses;  Surgeon: Jackolyn Confer, MD;   [facility];  Service: General;  Laterality: N/A;   SALPINGOOPHORECTOMY Left 02/20/2015   Procedure: SALPINGO OOPHORECTOMY;  Surgeon: Jackolyn Confer, MD;  [facility];  Service: General;  Laterality: Left;   [place] Left 02/23/2022   Procedure: TOTAL HIP ARTHROPLASTY ANTERIOR APPROACH;  Surgeon: Paralee Cancel, MD;  [facility];  Service: Orthopedics;  Laterality: Left;   Patient Active Problem List   Diagnosis Date Noted   Vitamin D deficiency 03/26/2022   S/P total left hip arthroplasty 02/23/2022   Adjustment disorder with depressed mood 11/18/2021   COVID-19 virus infection 07/07/2021   Psoriasis 01/15/2021   Chronic pain of left knee 01/15/2021   Ventral hernia without obstruction or gangrene 01/15/2021   Class 3 severe obesity with serious comorbidity and body mass index (BMI) of 40.0 to 44.9 in adult [facility]) 01/15/2019   Acne rosacea 05/10/2016   Multinodular goiter 02/27/2016   Subclinical thyrotoxicosis 02/26/2016   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 09/26/15 09/26/2015   Prediabetes 07/30/2015   Perforation of sigmoid colon ([place]) 02/20/2015   Diverticulitis of large intestine with abscess without bleeding 02/18/2015   OSA (obstructive sleep apnea) 05/09/2013   GASTROENTERITIS 08/18/2010   RAYNAUD'S SYNDROME 02/17/2009  PLANTAR FASCIITIS 02/17/2009   Asthma 06/12/2008   Hyperlipidemia 09/04/2007   DEVIATED SEPTUM 09/04/2007   ALLERGIC RHINITIS 09/04/2007   GERD 09/04/2007   OSTEOARTHRITIS 09/04/2007   HEADACHE 09/04/2007    PCP:  Laurey Morale, MD  REFERRING PROVIDER: Paralee Cancel, MD  REFERRING DIAG:  SHOULDER: M75.02 (ICD-10-CM) - Adhesive capsulitis of left shoulder  THERAPY DIAG:  Chronic left shoulder pain  Chronic right shoulder pain  Rationale for Evaluation and Treatment Rehabilitation  ONSET DATE: 02/23/22  SUBJECTIVE:   SUBJECTIVE STATEMENT: Pt states that she has been doing well.  She states that she hasn't been quite as active with her HEP this past week.   PERTINENT HISTORY: Abdominal hernia, smoker, COPD, A-Fib, 01/2015 colon resection  PAIN:  Are you having pain? Yes: NPRS scale: 1/10 Pain location: bil shoulder Lt 2/10 >Rt 0/10 Pain description: can be sharp Aggravating factors: reaching into cabinet Relieving factors: rest   FALLS:  Has patient fallen in last 6 months? No  LIVING ENVIRONMENT: Lives with: lives with their family Lives in: House/apartment Stairs: level home, 5 steps to enter Has following equipment at home: Single point cane and Environmental consultant - 4 wheeled  OCCUPATION: retired  PLOF: Independent with basic ADLs  PATIENT GOALS : Hip: return to walking and exercise; pt has a international trip coming up in March of 2024 and would like to be able to walk Shoulder: reaching overhead/at eye level, sleep on left side   OBJECTIVE:   DIAGNOSTIC FINDINGS:   IMPRESSION: Intraoperative images during left total hip arthroplasty. Normal alignment.  PATIENT SURVEYS:  FOTO shoulder 48  POSTURE: able to demo upright posture through the hip  LOWER EXTREMITY ROM:  WFL without incr in pain  LOWER EXTREMITY MMT:  07/05/22: gross 5/5 with exception of Lt hip flexion limited due to hernia   UPPER EXTREMITY MMT:  MMT Right eval Left eval L/R ROM 10/16 Left 11/13  Shoulder flexion- at elbow 22.4 11.3 Shoulder ROM at GHJ - 138/140    Shoulder extension      Shoulder abduction- at elbow 16.8 10.1 Shoulder ROM at GHJ - 108/ 145 142  Shoulder adduction      Shoulder extension      Shoulder internal rotation   C6   Shoulder external rotation   T12   Grip strength 30 30     (Blank rows = not tested)  UPPER EXTREMITY ROM: Lt UE 75% compared to Rt UE  GAIT: 9/11: mild vaulting over Lt LE   TODAY'S TREATMENT:  Treatment                            09/21/22:  Pulleys flexion- PT block scapula for subscap stretch; IR behind back Scaption  wall slide stretch MANUAL: subscap STM with flexion stretching Pertubation's on L UE Rows and pull downs with RTB 2x10  In mirror- 1lb each hand, OH reach flexion & scaption  Treatment                            09/06/22:  Pulleys flexion- PT block scapula for subscap stretch; IR behind back Scaption wall slide stretch MANUAL: subscap STM with flexion stretching PT resisted UE D2 flexion/ext In mirror- 1lb each hand, OH reach flexion & scaption Bent over single UE ext in supination   Treatment  08/30/22:  MANUAL: end range flexion inf GHJ mobs; flexion MWM for subscap trigger point; upper trap trigger point release Supine flexion with wand- able to achieve 144 Supine flexion + with wand Seated prayer to W   Treatment                            08/23/22:  MANUAL: STM subscap & upper trap; Grade 4 mobs at 90 abd AP, Grade 4 inf mobs at end range flexion  Supine shoulder flexion 3lb bar (reaching 165 degrees) Supine serratus punches 3lb Wall rolls up wall with physioball  Wall slides up wall for HEP Wall circles CW, CCW Wall push up- elbows wide Rows 2x10 with RTB Bilat ER with RTB 2x10    PATIENT EDUCATION:  Education details: Geophysicist/field seismologist of condition, POC, HEP, exercise form/rationale Person educated: Patient Education method: Explanation, Demonstration, Tactile cues, Verbal cues, and Handouts Education comprehension: verbalized understanding, returned demonstration, verbal cues required, and tactile cues required   HOME EXERCISE PROGRAM: Access Code:  [email or web address] - hip [email or web address] - shoulder   ASSESSMENT:  CLINICAL IMPRESSION: Siginficant improvement in ROM with OH reaching today. She states that she is feeling a lot looser and is now able to perform functional movements. Discussed scheduling a few more appts to work on strengthening within functional range prior to D/C.  HIP: OBJECTIVE IMPAIRMENTS Abnormal gait, cardiopulmonary status limiting  activity, decreased activity tolerance, decreased balance, decreased endurance, decreased mobility, difficulty walking, decreased ROM, decreased strength, hypomobility, increased muscle spasms, impaired flexibility, improper body mechanics, postural dysfunction, and pain.   ACTIVITY LIMITATIONS carrying, lifting, bending, standing, squatting, stairs, transfers, bed mobility, bathing, toileting, dressing, and locomotion level  PARTICIPATION LIMITATIONS: meal prep, cleaning, laundry, interpersonal relationship, driving, shopping, community activity, yard work, and exercise  PERSONAL FACTORS Age, Fitness, Past/current experiences, Time since onset of injury/illness/exacerbation, and 3+ comorbidities:    are also affecting patient's functional outcome.   REHAB POTENTIAL: Good  CLINICAL DECISION MAKING: Evolving/moderate complexity  EVALUATION COMPLEXITY: Moderate  Shoulder: OBJECTIVE IMPAIRMENTS decreased activity tolerance, decreased ROM, decreased strength, increased muscle spasms, impaired flexibility, impaired UE functional use, improper body mechanics, postural dysfunction, and pain.   ACTIVITY LIMITATIONS carrying, lifting, and reach over head  PARTICIPATION LIMITATIONS: meal prep, cleaning, and laundry  PERSONAL FACTORS 1 comorbidity: h/o adhesive capsulitis, known spurring are also affecting patient's functional outcome.   REHAB POTENTIAL: Good  CLINICAL DECISION MAKING: Evolving/moderate complexity  EVALUATION COMPLEXITY: Moderate   GOALS:   SHORT TERM GOALS:  1.  Independent with stretching program while maintaining pain levels <=3/10 Baseline:  Goal status: 9/25 achieved at this point and will continue to progress   LONG TERM GOALS: Target date:POC date  1.  Bil UE ROM within 10 deg of opp UE Baseline:  Goal status: ONGOING with Flexion within 2 degrees, and abduction with continued deficits.   2.  UE MMT 90% Rt to Lt Baseline:  Goal status: INITIAL  3.  Able  to lift dishes to mid and high shelves without incr in pain Baseline:  Goal status: INITIAL   PLAN: PT FREQUENCY: 1x/week  PT DURATION: 12 weeks   PLANNED INTERVENTIONS: Therapeutic exercises, Therapeutic activity, Neuromuscular re-education, Patient/Family education, Joint mobilization, Joint manipulation, Orthotic/Fit training, DME instructions, Aquatic Therapy, Dry Needling, Electrical stimulation, Spinal manipulation, Spinal mobilization, Cryotherapy, Moist heat, Compression bandaging, Taping, Traction, Ultrasound, Ionotophoresis '4mg'$ /ml Dexamethasone, Manual therapy, and Re-evaluation  PLAN FOR NEXT SESSION:  Parascapular strengthening.   Rudi Heap  PT, DPT 09/21/22  12:22 PM  Referring diagnosis?  Adhesive capsulitis of left shoulder Treatment diagnosis? (if different than referring diagnosis) Chronic left shoulder pain  Chronic right shoulder pain What was this (referring dx) caused by? '[]'$  Surgery '[]'$  Fall '[]'$  Ongoing issue '[]'$  Arthritis '[x]'$  Other: Sudden onset   Laterality: '[]'$  Rt '[]'$  Lt '[x]'$  Both  Check all possible CPT codes:  *CHOOSE 10 OR LESS*    '[x]'$  97110 (Therapeutic Exercise)  '[]'$  92507 (SLP Treatment)  '[x]'$  97112 (Neuro Re-ed)   '[]'$  92526 (Swallowing Treatment)   '[]'$  97116 (Gait Training)   '[]'$  [id number] (Cognitive Training, 1st 15 minutes) '[]'$  97140 (Manual Therapy)   '[]'$  97130 (Cognitive Training, each add'l 15 minutes)  '[]'$  97164 (Re-evaluation)                              '[]'$  Other, List CPT Code ____________  '[x]'$  97530 (Therapeutic Activities)     '[x]'$  97535 (Self Care)   '[]'$  All codes above (97110 - 97535)  '[x]'$  97012 (Mechanical Traction)  '[x]'$  97014 (E-stim Unattended)  '[]'$  97032 (E-stim manual)  '[]'$  97033 (Ionto)  '[]'$  97035 (Ultrasound) '[]'$  97750 (Physical Performance Training) '[]'$  [id number] (Aquatic Therapy) '[]'$  97016 (Vasopneumatic Device) '[]'$  [id number] (Paraffin) '[]'$  97034 (Contrast Bath) '[]'$  97597 (Wound Care 1st 20 sq cm) '[]'$  97598 (Wound Care each add'l 20 sq cm) '[]'$   97760 (Orthotic Fabrication, Fitting, Training Initial) '[]'$  [id number] (Prosthetic Management and Training Initial) '[]'$  [id number] (Orthotic or Prosthetic Training/ Modification Subsequent)

## 2022-09-29 ENCOUNTER — Ambulatory Visit (HOSPITAL_BASED_OUTPATIENT_CLINIC_OR_DEPARTMENT_OTHER): Payer: Medicare PPO | Attending: Orthopedic Surgery | Admitting: Physical Therapy

## 2022-09-29 ENCOUNTER — Encounter (HOSPITAL_BASED_OUTPATIENT_CLINIC_OR_DEPARTMENT_OTHER): Payer: Self-pay | Admitting: Physical Therapy

## 2022-09-29 DIAGNOSIS — M25512 Pain in left shoulder: Secondary | ICD-10-CM | POA: Insufficient documentation

## 2022-09-29 DIAGNOSIS — G8929 Other chronic pain: Secondary | ICD-10-CM | POA: Diagnosis present

## 2022-09-29 DIAGNOSIS — M6281 Muscle weakness (generalized): Secondary | ICD-10-CM | POA: Diagnosis present

## 2022-09-29 NOTE — Therapy (Signed)
OUTPATIENT PHYSICAL THERAPY Shoulder treatment    Patient Name: Alison Best MRN: [id number] DOB:02-06-1956, 66 y.o., female Today's Date: 09/29/2022   PT End of Session - 09/29/22 1215     Visit Number 20    Number of Visits 23    Date for PT Re-Evaluation 10/20/22    Authorization Type [employer] (now)    PT Start Time 1148    PT Stop Time 1228    PT Time Calculation (min) 40 min    Activity Tolerance Patient tolerated treatment well    Behavior During Therapy [facility] for tasks assessed/performed                  Past Medical History:  Diagnosis Date   Anal fissure    Arthritis    Bursitis    Constipation    COPD (chronic obstructive pulmonary disease) ([place])    Diverticulitis of colon 02/2015   Ectopic pregnancy    Emphysema of lung ([place])    Frozen shoulder    Heart murmur    Hernia, abdominal    Hyperlipidemia    Hyperthyroidism    Infertility, female    Irregular heartbeat    Joint pain    Obesity    Osteoarthritis    Pneumonia    Pre-diabetes    Sleep apnea    Swallowing difficulty    Thyroid disease    hyperthyroid   Past Surgical History:  Procedure Laterality Date   APPENDECTOMY     CESAREAN SECTION     COLONOSCOPY  09/10/2010   per Dr. Acquanetta Sit, diverticulosis of descending colon and sigmoid, internal hemorrhoids, repeat in 10 yrs   COLOSTOMY N/A 02/20/2015   Procedure: COLOSTOMY;  Surgeon: Jackolyn Confer, MD;  Location: [facility];  Service: General;  Laterality: N/A;   COLOSTOMY REVISION  02/20/2015   Procedure: Sigmoid Colectomy;  Surgeon: Jackolyn Confer, MD;  Location: [facility];  Service: General;;   CYSTOSCOPY N/A 02/20/2015   Procedure: Consuela Mimes;  Surgeon: Kathie Rhodes, MD;  Location: [facility];  Service: Urology;  Laterality: N/A;   DILATION AND CURETTAGE OF UTERUS     x3   ECTOPIC PREGNANCY SURGERY     ILEO LOOP COLOSTOMY CLOSURE N/A 09/26/2015   Procedure: LAPAROSCOPIC LYSIS OF ADHESIONS, COLOSTOMY CLOSURE;  Surgeon: Jackolyn Confer, MD;  Location: [facility];  Service: General;  Laterality: N/A;   LAPAROTOMY N/A 02/20/2015   Procedure: Emergency EXPLORATORY and drainiage of interabdominal abcesses;  Surgeon: Jackolyn Confer, MD;  Location: [facility];  Service: General;  Laterality: N/A;   SALPINGOOPHORECTOMY Left 02/20/2015   Procedure: SALPINGO OOPHORECTOMY;  Surgeon: Jackolyn Confer, MD;  Location: [facility];  Service: General;  Laterality: Left;   [place] Left 02/23/2022   Procedure: TOTAL HIP ARTHROPLASTY ANTERIOR APPROACH;  Surgeon: Paralee Cancel, MD;  Location: [facility];  Service: Orthopedics;  Laterality: Left;   Patient Active Problem List   Diagnosis Date Noted   Vitamin D deficiency 03/26/2022   S/P total left hip arthroplasty 02/23/2022   Adjustment disorder with depressed mood 11/18/2021   COVID-19 virus infection 07/07/2021   Psoriasis 01/15/2021   Chronic pain of left knee 01/15/2021   Ventral hernia without obstruction or gangrene 01/15/2021   Class 3 severe obesity with serious comorbidity and body mass index (BMI) of 40.0 to 44.9 in adult [facility]) 01/15/2019   Acne rosacea 05/10/2016   Multinodular goiter 02/27/2016   Subclinical thyrotoxicosis 02/26/2016   Colostomy in place [facility])  s/p colostomy reversal 09/26/15 09/26/2015   Prediabetes 07/30/2015   Perforation of sigmoid colon ([place]) 02/20/2015   Diverticulitis of large intestine with abscess without bleeding 02/18/2015   OSA (obstructive sleep apnea) 05/09/2013   GASTROENTERITIS 08/18/2010   RAYNAUD'S SYNDROME 02/17/2009   PLANTAR FASCIITIS 02/17/2009   Asthma 06/12/2008   Hyperlipidemia 09/04/2007   DEVIATED SEPTUM 09/04/2007   ALLERGIC RHINITIS 09/04/2007   GERD 09/04/2007   OSTEOARTHRITIS 09/04/2007   HEADACHE 09/04/2007    PCP:  Laurey Morale, MD  REFERRING PROVIDER: Paralee Cancel, MD  REFERRING DIAG:  SHOULDER: M75.02 (ICD-10-CM) - Adhesive capsulitis of left shoulder  THERAPY DIAG:  Chronic  left shoulder pain  Rationale for Evaluation and Treatment Rehabilitation  ONSET DATE: 02/23/22  SUBJECTIVE:   SUBJECTIVE STATEMENT:   The shoulder is the biggest thing that I need help with right now,the hip is great but I do have a hernia on that left side I just use the cane for balance and security. They are going to do surgery on the hernia at [facility], hoping for early February but working on losing weight for this.   PERTINENT HISTORY: Abdominal hernia, smoker, COPD, A-Fib, 01/2015 colon resection  PAIN:  Are you having pain? Yes: NPRS scale: 3/10 Pain location: L shoulder  Pain description: with certain movements- out to the side, flexion Aggravating factors: reaching OH and reaching at diagonal  Relieving factors: manual therapy   FALLS:  Has patient fallen in last 6 months? No  LIVING ENVIRONMENT: Lives with: lives with their family Lives in: House/apartment Stairs: level home, 5 steps to enter Has following equipment at home: Single point cane and Environmental consultant - 4 wheeled  OCCUPATION: retired  PLOF: Independent with basic ADLs  PATIENT GOALS : Hip: return to walking and exercise; pt has a international trip coming up in March of 2024 and would like to be able to walk Shoulder: reaching overhead/at eye level, sleep on left side   OBJECTIVE:   DIAGNOSTIC FINDINGS:   IMPRESSION: Intraoperative images during left total hip arthroplasty. Normal alignment.  PATIENT SURVEYS:  FOTO shoulder 48  POSTURE: increased lumbar lordosis, increased thoracic kyphosis, rounded shoulders/forward head   LOWER EXTREMITY ROM:  WFL without incr in pain  LOWER EXTREMITY MMT:  07/05/22: gross 5/5 with exception of Lt hip flexion limited due to hernia   UPPER EXTREMITY MMT:  MMT Right eval Left eval L/R ROM 10/16 Left 11/13 Left 12/6 Right 12/6  Shoulder flexion- at elbow 22.4 11.3 Shoulder ROM at GHJ - 138/140   4/5 5/5  Shoulder extension        Shoulder abduction- at  elbow 16.8 10.1 Shoulder ROM at GHJ - 108/ 145 142 4/5 5/5  Shoulder adduction        Shoulder extension        Shoulder internal rotation   C6     Shoulder external rotation   T12     Grip strength 30 30       (Blank rows = not tested)  UPPER EXTREMITY ROM: Lt UE 75% compared to Rt UE  12/6- shoulder AROM flexion R 165* L 120*, ABD R 165* L 121*, FER C7 B, FIR T7 B   GAIT: 9/11: mild vaulting over Lt LE   TODAY'S TREATMENT:  09/29/22  Objective measures + appropriate education, FOTO 53   TherEx  UBE L1 x3 minutes forward/3 minutes backward  Manual   Grade II inferior GH mobs, GH oscillation + light distraction  PROM/stretches into  flexion and ABD LUE      Treatment                            09/21/22:  Pulleys flexion- PT block scapula for subscap stretch; IR behind back Scaption wall slide stretch MANUAL: subscap STM with flexion stretching Pertubation's on L UE Rows and pull downs with RTB 2x10  In mirror- 1lb each hand, OH reach flexion & scaption  Treatment                            09/06/22:  Pulleys flexion- PT block scapula for subscap stretch; IR behind back Scaption wall slide stretch MANUAL: subscap STM with flexion stretching PT resisted UE D2 flexion/ext In mirror- 1lb each hand, OH reach flexion & scaption Bent over single UE ext in supination   Treatment                            08/30/22:  MANUAL: end range flexion inf GHJ mobs; flexion MWM for subscap trigger point; upper trap trigger point release Supine flexion with wand- able to achieve 144 Supine flexion + with wand Seated prayer to W   Treatment                            08/23/22:  MANUAL: STM subscap & upper trap; Grade 4 mobs at 90 abd AP, Grade 4 inf mobs at end range flexion  Supine shoulder flexion 3lb bar (reaching 165 degrees) Supine serratus punches 3lb Wall rolls up wall with physioball  Wall slides up wall for HEP Wall circles CW, CCW Wall push up- elbows  wide Rows 2x10 with RTB Bilat ER with RTB 2x10    PATIENT EDUCATION:  Education details: POC going forward, return to MD/going on hold vs DC at end of month  Person educated: Patient Education method: Consulting civil engineer, Demonstration, Tactile cues, Verbal cues, and Handouts Education comprehension: verbalized understanding, returned demonstration, verbal cues required, and tactile cues required   HOME EXERCISE PROGRAM: Access Code:  [email or web address] - hip [email or web address] - shoulder   ASSESSMENT:  CLINICAL IMPRESSION:               Crissy arrives today doing OK, her cert to the MD was up so we got objective measures. Not showing a lot of significant progress with objectives or on FOTO but she does endorse she is feeling better. At this point will just continue to the end of the month with HEP updates before DC vs going on hold. I think due to limited progress she needs to return to MD, very close to maximizing rehab potential at this point.   HIP: OBJECTIVE IMPAIRMENTS Abnormal gait, cardiopulmonary status limiting activity, decreased activity tolerance, decreased balance, decreased endurance, decreased mobility, difficulty walking, decreased ROM, decreased strength, hypomobility, increased muscle spasms, impaired flexibility, improper body mechanics, postural dysfunction, and pain.   ACTIVITY LIMITATIONS carrying, lifting, bending, standing, squatting, stairs, transfers, bed mobility, bathing, toileting, dressing, and locomotion level  PARTICIPATION LIMITATIONS: meal prep, cleaning, laundry, interpersonal relationship, driving, shopping, community activity, yard work, and exercise  PERSONAL FACTORS Age, Fitness, Past/current experiences, Time since onset of injury/illness/exacerbation, and 3+ comorbidities:    are also affecting patient's functional outcome.   REHAB POTENTIAL: Good  CLINICAL DECISION MAKING: Evolving/moderate complexity  EVALUATION COMPLEXITY: Moderate  Shoulder: OBJECTIVE  IMPAIRMENTS decreased activity tolerance, decreased ROM, decreased strength, increased muscle spasms, impaired flexibility, impaired UE functional use, improper body mechanics, postural dysfunction, and pain.   ACTIVITY LIMITATIONS carrying, lifting, and reach over head  PARTICIPATION LIMITATIONS: meal prep, cleaning, and laundry  PERSONAL FACTORS 1 comorbidity: h/o adhesive capsulitis, known spurring are also affecting patient's functional outcome.   REHAB POTENTIAL: Good  CLINICAL DECISION MAKING: Evolving/moderate complexity  EVALUATION COMPLEXITY: Moderate   GOALS:   SHORT TERM GOALS:  1.  Independent with stretching program while maintaining pain levels <=3/10 Baseline:  Goal status: 9/25 achieved at this point and will continue to progress   LONG TERM GOALS: Target date:POC date  1.  Bil UE ROM within 10 deg of opp UE Baseline:  Goal status: ONGOING 12/6- ER/IR met goal, still very limited for flexion and ABD   2.  UE MMT 90% Rt to Lt Baseline:  Goal status: IN PROGRESS 12/6 maybe 75-80% (see MMT above)  3.  Able to lift dishes to mid and high shelves without incr in pain Baseline:  Goal status: IN PROGRESS 12/6- improving but not with security, still not yet    PLAN: PT FREQUENCY: 1x/week  PT DURATION: 3 weeks   PLANNED INTERVENTIONS: Therapeutic exercises, Therapeutic activity, Neuromuscular re-education, Patient/Family education, Joint mobilization, Joint manipulation, Orthotic/Fit training, DME instructions, Aquatic Therapy, Dry Needling, Electrical stimulation, Spinal manipulation, Spinal mobilization, Cryotherapy, Moist heat, Compression bandaging, Taping, Traction, Ultrasound, Ionotophoresis 2m/ml Dexamethasone, Manual therapy, and Re-evaluation  PLAN FOR NEXT SESSION:  Parascapular strengthening. ROM, manual as tolerated. HEP updates, DC vs going on hold at the end of December    Adilynne Fitzwater U PT DPT PN2  09/29/2022, 12:29 PM   Referring diagnosis?   Adhesive capsulitis of left shoulder Treatment diagnosis? (if different than referring diagnosis) Chronic left shoulder pain  Chronic right shoulder pain What was this (referring dx) caused by? _0  Surgery _1  Fall _2  Ongoing issue _3  Arthritis _4  Other: Sudden onset   Laterality: _5  Rt _6  Lt _7  Both  Check all possible CPT codes:  *CHOOSE 10 OR LESS*    _8  97110 (Therapeutic Exercise)  _9  92507 (SLP Treatment)  _10  951700(Neuro Re-ed)   _11  92526 (Swallowing Treatment)   _12  917494(Gait Training)   _13  949675(Cognitive Training, 1st 15 minutes) _14  97140 (Manual Therapy)   _15  97130 (Cognitive Training, each add'l 15 minutes)  _16  97164 (Re-evaluation)                              _17  Other, List CPT Code ____________  _18  991638(Therapeutic Activities)     _19  946659(Self Care)   _20  All codes above (97110 - 97535)  _21  97012 (Mechanical Traction)  _22  97014 (E-stim Unattended)  _23  97032 (E-stim manual)  _24  97033 (Ionto)  _25  97035 (Ultrasound) _26  97750 (Physical Performance Training) _27  993570(Aquatic Therapy) _28  97016 (Vasopneumatic Device) _29  917793(Paraffin) _30  990300(Contrast Bath) _31  97597 (Wound Care 1st 20 sq cm) _32  97598 (Wound Care each add'l 20 sq cm) _33  97760 (Orthotic Fabrication, Fitting, Training Initial) _34  [id number](Prosthetic Management and Training Initial) _35  [id number](Orthotic or Prosthetic Training/ Modification Subsequent)

## 2022-10-06 ENCOUNTER — Ambulatory Visit (HOSPITAL_BASED_OUTPATIENT_CLINIC_OR_DEPARTMENT_OTHER): Payer: Medicare PPO | Admitting: Physical Therapy

## 2022-10-06 ENCOUNTER — Encounter (HOSPITAL_BASED_OUTPATIENT_CLINIC_OR_DEPARTMENT_OTHER): Payer: Self-pay | Admitting: Physical Therapy

## 2022-10-06 DIAGNOSIS — M25512 Pain in left shoulder: Secondary | ICD-10-CM | POA: Diagnosis not present

## 2022-10-06 DIAGNOSIS — G8929 Other chronic pain: Secondary | ICD-10-CM

## 2022-10-06 NOTE — Therapy (Signed)
OUTPATIENT PHYSICAL THERAPY Shoulder treatment    Patient Name: Alison Best MRN: [id number] DOB:01-21-1956, 66 y.o., female Today's Date: 10/06/2022   PT End of Session - 10/06/22 1153     Visit Number 21    Number of Visits 23    Date for PT Re-Evaluation 10/20/22    Authorization Type [employer] (now)    PT Start Time 1146    PT Stop Time 1226    PT Time Calculation (min) 40 min    Activity Tolerance Patient tolerated treatment well    Behavior During Therapy WFL for tasks assessed/performed                   Past Medical History:  Diagnosis Date   Anal fissure    Arthritis    Bursitis    Constipation    COPD (chronic obstructive pulmonary disease) ([place])    Diverticulitis of colon 02/2015   Ectopic pregnancy    Emphysema of lung ([place])    Frozen shoulder    Heart murmur    Hernia, abdominal    Hyperlipidemia    Hyperthyroidism    Infertility, female    Irregular heartbeat    Joint pain    Obesity    Osteoarthritis    Pneumonia    Pre-diabetes    Sleep apnea    Swallowing difficulty    Thyroid disease    hyperthyroid   Past Surgical History:  Procedure Laterality Date   APPENDECTOMY     CESAREAN SECTION     COLONOSCOPY  09/10/2010   per Dr. Acquanetta Sit, diverticulosis of descending colon and sigmoid, internal hemorrhoids, repeat in 10 yrs   COLOSTOMY N/A 02/20/2015   Procedure: COLOSTOMY;  Surgeon: Jackolyn Confer, MD;  Location: [facility];  Service: General;  Laterality: N/A;   COLOSTOMY REVISION  02/20/2015   Procedure: Sigmoid Colectomy;  Surgeon: Jackolyn Confer, MD;  Location: [facility];  Service: General;;   CYSTOSCOPY N/A 02/20/2015   Procedure: Consuela Mimes;  Surgeon: Kathie Rhodes, MD;  Location: [facility];  Service: Urology;  Laterality: N/A;   DILATION AND CURETTAGE OF UTERUS     x3   ECTOPIC PREGNANCY SURGERY     ILEO LOOP COLOSTOMY CLOSURE N/A 09/26/2015   Procedure: LAPAROSCOPIC LYSIS OF ADHESIONS, COLOSTOMY CLOSURE;  Surgeon: Jackolyn Confer, MD;  Location: [facility];  Service: General;  Laterality: N/A;   LAPAROTOMY N/A 02/20/2015   Procedure: Emergency EXPLORATORY and drainiage of interabdominal abcesses;  Surgeon: Jackolyn Confer, MD;  Location: [facility];  Service: General;  Laterality: N/A;   SALPINGOOPHORECTOMY Left 02/20/2015   Procedure: SALPINGO OOPHORECTOMY;  Surgeon: Jackolyn Confer, MD;  Location: [facility];  Service: General;  Laterality: Left;   [place] Left 02/23/2022   Procedure: TOTAL HIP ARTHROPLASTY ANTERIOR APPROACH;  Surgeon: Paralee Cancel, MD;  Location: [facility];  Service: Orthopedics;  Laterality: Left;   Patient Active Problem List   Diagnosis Date Noted   Vitamin D deficiency 03/26/2022   S/P total left hip arthroplasty 02/23/2022   Adjustment disorder with depressed mood 11/18/2021   COVID-19 virus infection 07/07/2021   Psoriasis 01/15/2021   Chronic pain of left knee 01/15/2021   Ventral hernia without obstruction or gangrene 01/15/2021   Class 3 severe obesity with serious comorbidity and body mass index (BMI) of 40.0 to 44.9 in adult [facility]) 01/15/2019   Acne rosacea 05/10/2016   Multinodular goiter 02/27/2016   Subclinical thyrotoxicosis 02/26/2016   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 09/26/15 09/26/2015   Prediabetes 07/30/2015   Perforation of sigmoid colon ([place]) 02/20/2015   Diverticulitis of large intestine with abscess without bleeding 02/18/2015   OSA (obstructive sleep apnea) 05/09/2013   GASTROENTERITIS 08/18/2010   RAYNAUD'S SYNDROME 02/17/2009   PLANTAR FASCIITIS 02/17/2009   Asthma 06/12/2008   Hyperlipidemia 09/04/2007   DEVIATED SEPTUM 09/04/2007   ALLERGIC RHINITIS 09/04/2007   GERD 09/04/2007   OSTEOARTHRITIS 09/04/2007   HEADACHE 09/04/2007    PCP:  Laurey Morale, MD  REFERRING PROVIDER: Paralee Cancel, MD  REFERRING DIAG:  SHOULDER: M75.02 (ICD-10-CM) - Adhesive capsulitis of left shoulder  THERAPY DIAG:  Chronic  left shoulder pain  Rationale for Evaluation and Treatment Rehabilitation  ONSET DATE: 02/23/22  SUBJECTIVE:   SUBJECTIVE STATEMENT:   I feel like the arm bike changed my life when you put me on it last time, the arm felt great after the arm bike I've been trying recreate the motion at home. Put in a request for the gym itself to get one. I've been pretty tight below the shoulder in the upper arm maybe because I've been using it more. Noticing increased numbness/tingling in hands in general when driving after 10 minutes, have to adjust arms/hands.   PERTINENT HISTORY: Abdominal hernia, smoker, COPD, A-Fib, 01/2015 colon resection  PAIN:  Are you having pain? Yes: NPRS scale: 3/10 Pain location: L shoulder  Pain description: not sharp but "like a stopping point" and weakness  Aggravating factors: reaching OH and reaching at diagonal  Relieving factors: manual therapy   FALLS:  Has patient fallen in last 6 months? No  LIVING ENVIRONMENT: Lives with: lives with their family Lives in: House/apartment Stairs: level home, 5 steps to enter Has following equipment at home: Single point cane and Environmental consultant - 4 wheeled  OCCUPATION: retired  PLOF: Independent with basic ADLs  PATIENT GOALS : Hip: return to walking and exercise; pt has a international trip coming up in March of 2024 and would like to be able to walk Shoulder: reaching overhead/at eye level, sleep on left side   OBJECTIVE:   DIAGNOSTIC FINDINGS:   IMPRESSION: Intraoperative images during left total hip arthroplasty. Normal alignment.  PATIENT SURVEYS:  FOTO shoulder 48  POSTURE: increased lumbar lordosis, increased thoracic kyphosis, rounded shoulders/forward head   LOWER EXTREMITY ROM:  WFL without incr in pain  LOWER EXTREMITY MMT:  07/05/22: gross 5/5 with exception of Lt hip flexion limited due to hernia   UPPER EXTREMITY MMT:  MMT Right eval Left eval L/R ROM 10/16 Left 11/13 Left 12/6 Right 12/6   Shoulder flexion- at elbow 22.4 11.3 Shoulder ROM at GHJ - 138/140   4/5 5/5  Shoulder extension        Shoulder abduction- at elbow 16.8 10.1 Shoulder ROM at GHJ - 108/ 145 142 4/5 5/5  Shoulder adduction        Shoulder extension        Shoulder internal rotation   C6     Shoulder external rotation   T12     Grip strength 30 30       (Blank rows = not tested)  UPPER EXTREMITY ROM: Lt UE 75% compared to Rt UE  12/6- shoulder AROM flexion R 165* L 120*, ABD R 165* L 121*, FER C7 B, FIR T7 B   GAIT: 9/11: mild vaulting over Lt LE   TODAY'S TREATMENT:  12/13  TherEx  UBE L1 x3 min forward/3 min backwards Door flexion  stretch with pillow case 10x10 second holds Table top shoulder ABD stretch 10x10 second holds  Doorway ER stretch with towel 10x5 second holds      Manual  Grade II-III inferior GH mobs, GH oscillation + light distraction, PROM/stretches into flexion and ABD LUE, ER stretching to tolerance at 20* ABD  - supine flexion AROM 142*, seated 125*   09/29/22  Objective measures + appropriate education, FOTO 53   TherEx  UBE L1 x3 minutes forward/3 minutes backward  Manual   Grade II inferior GH mobs, GH oscillation + light distraction  PROM/stretches into flexion and ABD LUE      Treatment                            09/21/22:  Pulleys flexion- PT block scapula for subscap stretch; IR behind back Scaption wall slide stretch MANUAL: subscap STM with flexion stretching Pertubation's on L UE Rows and pull downs with RTB 2x10  In mirror- 1lb each hand, OH reach flexion & scaption  Treatment                            09/06/22:  Pulleys flexion- PT block scapula for subscap stretch; IR behind back Scaption wall slide stretch MANUAL: subscap STM with flexion stretching PT resisted UE D2 flexion/ext In mirror- 1lb each hand, OH reach flexion & scaption Bent over single UE ext in supination   Treatment                             08/30/22:  MANUAL: end range flexion inf GHJ mobs; flexion MWM for subscap trigger point; upper trap trigger point release Supine flexion with wand- able to achieve 144 Supine flexion + with wand Seated prayer to W   Treatment                            08/23/22:  MANUAL: STM subscap & upper trap; Grade 4 mobs at 90 abd AP, Grade 4 inf mobs at end range flexion  Supine shoulder flexion 3lb bar (reaching 165 degrees) Supine serratus punches 3lb Wall rolls up wall with physioball  Wall slides up wall for HEP Wall circles CW, CCW Wall push up- elbows wide Rows 2x10 with RTB Bilat ER with RTB 2x10    PATIENT EDUCATION:  Education details: POC going forward, return to MD/going on hold vs DC at end of month  Person educated: Patient Education method: Consulting civil engineer, Demonstration, Tactile cues, Verbal cues, and Handouts Education comprehension: verbalized understanding, returned demonstration, verbal cues required, and tactile cues required   HOME EXERCISE PROGRAM: Access Code:  [email or web address] - hip [email or web address] - shoulder   ASSESSMENT:  CLINICAL IMPRESSION:                Sharine arrives doing OK, really feels like the UBE is helpful for her. We warmed up on this machine then continued to focus on shoulder ROM, updated HEP as well. Will continue efforts but continue to feel overall PT prognosis remains fair. Pain 2/10 at EOS.   HIP: OBJECTIVE IMPAIRMENTS Abnormal gait, cardiopulmonary status limiting activity, decreased activity tolerance, decreased balance, decreased endurance, decreased mobility, difficulty walking, decreased ROM, decreased strength, hypomobility, increased muscle spasms, impaired flexibility, improper body mechanics, postural dysfunction, and pain.   ACTIVITY LIMITATIONS carrying, lifting, bending, standing, squatting,  stairs, transfers, bed mobility, bathing, toileting, dressing, and locomotion level  PARTICIPATION LIMITATIONS: meal prep, cleaning, laundry, interpersonal  relationship, driving, shopping, community activity, yard work, and exercise  PERSONAL FACTORS Age, Fitness, Past/current experiences, Time since onset of injury/illness/exacerbation, and 3+ comorbidities:    are also affecting patient's functional outcome.   REHAB POTENTIAL: Good  CLINICAL DECISION MAKING: Evolving/moderate complexity  EVALUATION COMPLEXITY: Moderate  Shoulder: OBJECTIVE IMPAIRMENTS decreased activity tolerance, decreased ROM, decreased strength, increased muscle spasms, impaired flexibility, impaired UE functional use, improper body mechanics, postural dysfunction, and pain.   ACTIVITY LIMITATIONS carrying, lifting, and reach over head  PARTICIPATION LIMITATIONS: meal prep, cleaning, and laundry  PERSONAL FACTORS 1 comorbidity: h/o adhesive capsulitis, known spurring are also affecting patient's functional outcome.   REHAB POTENTIAL: Good  CLINICAL DECISION MAKING: Evolving/moderate complexity  EVALUATION COMPLEXITY: Moderate   GOALS:   SHORT TERM GOALS:  1.  Independent with stretching program while maintaining pain levels <=3/10 Baseline:  Goal status: 9/25 achieved at this point and will continue to progress   LONG TERM GOALS: Target date:POC date  1.  Bil UE ROM within 10 deg of opp UE Baseline:  Goal status: ONGOING 12/6- ER/IR met goal, still very limited for flexion and ABD   2.  UE MMT 90% Rt to Lt Baseline:  Goal status: IN PROGRESS 12/6 maybe 75-80% (see MMT above)  3.  Able to lift dishes to mid and high shelves without incr in pain Baseline:  Goal status: IN PROGRESS 12/6- improving but not with security, still not yet    PLAN: PT FREQUENCY: 1x/week  PT DURATION: 3 weeks   PLANNED INTERVENTIONS: Therapeutic exercises, Therapeutic activity, Neuromuscular re-education, Patient/Family education, Joint mobilization, Joint manipulation, Orthotic/Fit training, DME instructions, Aquatic Therapy, Dry Needling, Electrical stimulation,  Spinal manipulation, Spinal mobilization, Cryotherapy, Moist heat, Compression bandaging, Taping, Traction, Ultrasound, Ionotophoresis 108m/ml Dexamethasone, Manual therapy, and Re-evaluation  PLAN FOR NEXT SESSION:  Parascapular strengthening. ROM, manual as tolerated. HEP updates, DC vs going on hold at the end of December. Increase focus on strength next session    Jeanell Mangan U PT DPT PN2  10/06/2022, 12:26 PM   Referring diagnosis?  Adhesive capsulitis of left shoulder Treatment diagnosis? (if different than referring diagnosis) Chronic left shoulder pain  Chronic right shoulder pain What was this (referring dx) caused by? _0  Surgery _1  Fall _2  Ongoing issue _3  Arthritis _4  Other: Sudden onset   Laterality: _5  Rt _6  Lt _7  Both  Check all possible CPT codes:  *CHOOSE 10 OR LESS*    _8  97110 (Therapeutic Exercise)  _9  92507 (SLP Treatment)  _10  975102(Neuro Re-ed)   _11  92526 (Swallowing Treatment)   _12  958527(Gait Training)   _13  978242(Cognitive Training, 1st 15 minutes) _14  97140 (Manual Therapy)   _15  97130 (Cognitive Training, each add'l 15 minutes)  _16  97164 (Re-evaluation)                              _17  Other, List CPT Code ____________  _18  935361(Therapeutic Activities)     _19  944315(Self Care)   _20  All codes above (97110 - 97535)  _21  97012 (Mechanical Traction)  _22  97014 (E-stim Unattended)  _23  97032 (E-stim manual)  _24  97033 (Ionto)  _25  97035 (Ultrasound) _26  97750 (Physical Performance Training) _27  940086(Aquatic Therapy) _28  97016 (Vasopneumatic Device) _29  976195(Paraffin) _30  97034 (Contrast Bath) _31  97597 (Wound Care 1st 20 sq cm) _32  909326(Wound Care  each add'l 20 sq cm) _0  97760 (Orthotic Fabrication, Fitting, Training Initial) _1  [id number] (Prosthetic Management and Training Initial) _2  [id number] (Orthotic or Prosthetic Training/ Modification Subsequent)

## 2022-10-13 ENCOUNTER — Encounter (HOSPITAL_BASED_OUTPATIENT_CLINIC_OR_DEPARTMENT_OTHER): Payer: Medicare PPO | Admitting: Physical Therapy

## 2022-10-20 ENCOUNTER — Ambulatory Visit (HOSPITAL_BASED_OUTPATIENT_CLINIC_OR_DEPARTMENT_OTHER): Payer: Medicare PPO | Admitting: Physical Therapy

## 2022-10-20 ENCOUNTER — Encounter (HOSPITAL_BASED_OUTPATIENT_CLINIC_OR_DEPARTMENT_OTHER): Payer: Self-pay | Admitting: Physical Therapy

## 2022-10-20 DIAGNOSIS — G8929 Other chronic pain: Secondary | ICD-10-CM

## 2022-10-20 DIAGNOSIS — M6281 Muscle weakness (generalized): Secondary | ICD-10-CM

## 2022-10-20 DIAGNOSIS — M25512 Pain in left shoulder: Secondary | ICD-10-CM | POA: Diagnosis not present

## 2022-10-20 NOTE — Therapy (Signed)
OUTPATIENT PHYSICAL THERAPY Shoulder treatment    Patient Name: Alison Best MRN: [id number] DOB:Apr 29, 1956, 66 y.o., female Today's Date: 10/20/2022   PT End of Session - 10/20/22 1107     Visit Number 22    Number of Visits 23    Date for PT Re-Evaluation 10/27/22    Authorization Type [employer] (now)    PT Start Time 1102    PT Stop Time 1142    PT Time Calculation (min) 40 min    Activity Tolerance Patient tolerated treatment well    Behavior During Therapy [facility] for tasks assessed/performed                    Past Medical History:  Diagnosis Date   Anal fissure    Arthritis    Bursitis    Constipation    COPD (chronic obstructive pulmonary disease) ([place])    Diverticulitis of colon 02/2015   Ectopic pregnancy    Emphysema of lung ([place])    Frozen shoulder    Heart murmur    Hernia, abdominal    Hyperlipidemia    Hyperthyroidism    Infertility, female    Irregular heartbeat    Joint pain    Obesity    Osteoarthritis    Pneumonia    Pre-diabetes    Sleep apnea    Swallowing difficulty    Thyroid disease    hyperthyroid   Past Surgical History:  Procedure Laterality Date   APPENDECTOMY     CESAREAN SECTION     COLONOSCOPY  09/10/2010   per Dr. Acquanetta Sit, diverticulosis of descending colon and sigmoid, internal hemorrhoids, repeat in 10 yrs   COLOSTOMY N/A 02/20/2015   Procedure: COLOSTOMY;  Surgeon: Jackolyn Confer, MD;  Location: [facility];  Service: General;  Laterality: N/A;   COLOSTOMY REVISION  02/20/2015   Procedure: Sigmoid Colectomy;  Surgeon: Jackolyn Confer, MD;  Location: [facility];  Service: General;;   CYSTOSCOPY N/A 02/20/2015   Procedure: Consuela Mimes;  Surgeon: Kathie Rhodes, MD;  Location: [facility];  Service: Urology;  Laterality: N/A;   DILATION AND CURETTAGE OF UTERUS     x3   ECTOPIC PREGNANCY SURGERY     ILEO LOOP COLOSTOMY CLOSURE N/A 09/26/2015   Procedure: LAPAROSCOPIC LYSIS OF ADHESIONS, COLOSTOMY CLOSURE;  Surgeon:  Jackolyn Confer, MD;  Location: [facility];  Service: General;  Laterality: N/A;   LAPAROTOMY N/A 02/20/2015   Procedure: Emergency EXPLORATORY and drainiage of interabdominal abcesses;  Surgeon: Jackolyn Confer, MD;  Location: [facility];  Service: General;  Laterality: N/A;   SALPINGOOPHORECTOMY Left 02/20/2015   Procedure: SALPINGO OOPHORECTOMY;  Surgeon: Jackolyn Confer, MD;  Location: [facility];  Service: General;  Laterality: Left;   [place] Left 02/23/2022   Procedure: TOTAL HIP ARTHROPLASTY ANTERIOR APPROACH;  Surgeon: Paralee Cancel, MD;  Location: [facility];  Service: Orthopedics;  Laterality: Left;   Patient Active Problem List   Diagnosis Date Noted   Vitamin D deficiency 03/26/2022   S/P total left hip arthroplasty 02/23/2022   Adjustment disorder with depressed mood 11/18/2021   COVID-19 virus infection 07/07/2021   Psoriasis 01/15/2021   Chronic pain of left knee 01/15/2021   Ventral hernia without obstruction or gangrene 01/15/2021   Class 3 severe obesity with serious comorbidity and body mass index (BMI) of 40.0 to 44.9 in adult [facility]) 01/15/2019   Acne rosacea 05/10/2016   Multinodular goiter 02/27/2016   Subclinical thyrotoxicosis 02/26/2016   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 colostomy reversal 09/26/15 09/26/2015   Prediabetes 07/30/2015   Perforation of sigmoid colon ([place]) 02/20/2015   Diverticulitis of large intestine with abscess without bleeding 02/18/2015   OSA (obstructive sleep apnea) 05/09/2013   GASTROENTERITIS 08/18/2010   RAYNAUD'S SYNDROME 02/17/2009   PLANTAR FASCIITIS 02/17/2009   Asthma 06/12/2008   Hyperlipidemia 09/04/2007   DEVIATED SEPTUM 09/04/2007   ALLERGIC RHINITIS 09/04/2007   GERD 09/04/2007   OSTEOARTHRITIS 09/04/2007   HEADACHE 09/04/2007    PCP:  Laurey Morale, MD  REFERRING PROVIDER: Paralee Cancel, MD  REFERRING DIAG:  SHOULDER: M75.02 (ICD-10-CM) - Adhesive capsulitis of left shoulder  THERAPY DIAG:   Chronic left shoulder pain  Muscle weakness (generalized)  Rationale for Evaluation and Treatment Rehabilitation  ONSET DATE: 02/23/22  SUBJECTIVE:   SUBJECTIVE STATEMENT:   Things are going pretty good, I feel like I have a catch in my muscle in my arm sometimes. Was able to reach the red and green plates now.    PERTINENT HISTORY: Abdominal hernia, smoker, COPD, A-Fib, 01/2015 colon resection  PAIN:  Are you having pain? Yes: NPRS scale: 2/10 Pain location: L shoulder  Pain description: dull ache  Aggravating factors: moving it in certain directions  Relieving factors: stretches    FALLS:  Has patient fallen in last 6 months? No  LIVING ENVIRONMENT: Lives with: lives with their family Lives in: House/apartment Stairs: level home, 5 steps to enter Has following equipment at home: Single point cane and Environmental consultant - 4 wheeled  OCCUPATION: retired  PLOF: Independent with basic ADLs  PATIENT GOALS : Hip: return to walking and exercise; pt has a international trip coming up in March of 2024 and would like to be able to walk Shoulder: reaching overhead/at eye level, sleep on left side   OBJECTIVE:   DIAGNOSTIC FINDINGS:   IMPRESSION: Intraoperative images during left total hip arthroplasty. Normal alignment.  PATIENT SURVEYS:  FOTO shoulder 48  POSTURE: increased lumbar lordosis, increased thoracic kyphosis, rounded shoulders/forward head   LOWER EXTREMITY ROM:  WFL without incr in pain  LOWER EXTREMITY MMT:  07/05/22: gross 5/5 with exception of Lt hip flexion limited due to hernia   UPPER EXTREMITY MMT:  MMT Right eval Left eval L/R ROM 10/16 Left 11/13 Left 12/6 Right 12/6  Shoulder flexion- at elbow 22.4 11.3 Shoulder ROM at GHJ - 138/140   4/5 5/5  Shoulder extension        Shoulder abduction- at elbow 16.8 10.1 Shoulder ROM at GHJ - 108/ 145 142 4/5 5/5  Shoulder adduction        Shoulder extension        Shoulder internal rotation   C6      Shoulder external rotation   T12     Grip strength 30 30       (Blank rows = not tested)  UPPER EXTREMITY ROM: Lt UE 75% compared to Rt UE  12/6- shoulder AROM flexion R 165* L 120*, ABD R 165* L 121*, FER C7 B, FIR T7 B   GAIT: 9/11: mild vaulting over Lt LE   TODAY'S TREATMENT:  10/20/22  TherEx  UBE L1.5 x3 min forward/3 min backward  Supine flexion 2x10 2# L UE  Sidelying ABD LUE 2x10 2#  Serratus punches 2# 2x10  Sidelying ER 2# 2x10 towel pinched under elbow  Scap retractions x15 yellow TB Shoulder extensions x10 yellow TB   Manual  PROM/stretches into flexion and ABD LUE, ER stretching  12/13  TherEx  UBE L1 x3 min forward/3 min backwards Door flexion stretch with pillow case 10x10 second holds Table top shoulder ABD stretch 10x10 second holds  Doorway ER stretch with towel 10x5 second holds      Manual  Grade II-III inferior GH mobs, GH oscillation + light distraction, PROM/stretches into flexion and ABD LUE, ER stretching to tolerance at 20* ABD  - supine flexion AROM 142*, seated 125*   09/29/22  Objective measures + appropriate education, FOTO 53   TherEx  UBE L1 x3 minutes forward/3 minutes backward  Manual   Grade II inferior GH mobs, GH oscillation + light distraction  PROM/stretches into flexion and ABD LUE      Treatment                            09/21/22:  Pulleys flexion- PT block scapula for subscap stretch; IR behind back Scaption wall slide stretch MANUAL: subscap STM with flexion stretching Pertubation's on L UE Rows and pull downs with RTB 2x10  In mirror- 1lb each hand, OH reach flexion & scaption  Treatment                            09/06/22:  Pulleys flexion- PT block scapula for subscap stretch; IR behind back Scaption wall slide stretch MANUAL: subscap STM with flexion stretching PT resisted UE D2 flexion/ext In mirror- 1lb each hand, OH reach flexion & scaption Bent over single UE ext in  supination   Treatment                            08/30/22:  MANUAL: end range flexion inf GHJ mobs; flexion MWM for subscap trigger point; upper trap trigger point release Supine flexion with wand- able to achieve 144 Supine flexion + with wand Seated prayer to W   Treatment                            08/23/22:  MANUAL: STM subscap & upper trap; Grade 4 mobs at 90 abd AP, Grade 4 inf mobs at end range flexion  Supine shoulder flexion 3lb bar (reaching 165 degrees) Supine serratus punches 3lb Wall rolls up wall with physioball  Wall slides up wall for HEP Wall circles CW, CCW Wall push up- elbows wide Rows 2x10 with RTB Bilat ER with RTB 2x10    PATIENT EDUCATION:  Education details: reassessment next session,  moist heat and consider seeing massage therapist for mm spasm/trigger point management, possible DOMS after today and management strategies  Person educated: Patient Education method: Explanation, Demonstration, Tactile cues, Verbal cues, and Handouts Education comprehension: verbalized understanding, returned demonstration, verbal cues required, and tactile cues required   HOME EXERCISE PROGRAM: Access Code:  [email or web address] - hip [email or web address] - shoulder   ASSESSMENT:  CLINICAL IMPRESSION:               Alison Best arrives today doing well, sounds like her reach [facility] has been getting better at home. Postponed re-assessment to next session as she had to miss last week due to being ill, worked on ROM and functional strengthening as able this session. She's clearly been working hard at home as all ROM has drastically improved at this point! Seems to be making progress, we will need to reassess to extend cert next week  regardless and will determine POC (extension vs DC) at that time.   HIP: OBJECTIVE IMPAIRMENTS Abnormal gait, cardiopulmonary status limiting activity, decreased activity tolerance, decreased balance, decreased endurance, decreased mobility, difficulty walking, decreased  ROM, decreased strength, hypomobility, increased muscle spasms, impaired flexibility, improper body mechanics, postural dysfunction, and pain.   ACTIVITY LIMITATIONS carrying, lifting, bending, standing, squatting, stairs, transfers, bed mobility, bathing, toileting, dressing, and locomotion level  PARTICIPATION LIMITATIONS: meal prep, cleaning, laundry, interpersonal relationship, driving, shopping, community activity, yard work, and exercise  PERSONAL FACTORS Age, Fitness, Past/current experiences, Time since onset of injury/illness/exacerbation, and 3+ comorbidities:    are also affecting patient's functional outcome.   REHAB POTENTIAL: Good  CLINICAL DECISION MAKING: Evolving/moderate complexity  EVALUATION COMPLEXITY: Moderate  Shoulder: OBJECTIVE IMPAIRMENTS decreased activity tolerance, decreased ROM, decreased strength, increased muscle spasms, impaired flexibility, impaired UE functional use, improper body mechanics, postural dysfunction, and pain.   ACTIVITY LIMITATIONS carrying, lifting, and reach over head  PARTICIPATION LIMITATIONS: meal prep, cleaning, and laundry  PERSONAL FACTORS 1 comorbidity: h/o adhesive capsulitis, known spurring are also affecting patient's functional outcome.   REHAB POTENTIAL: Good  CLINICAL DECISION MAKING: Evolving/moderate complexity  EVALUATION COMPLEXITY: Moderate   GOALS:   SHORT TERM GOALS:  1.  Independent with stretching program while maintaining pain levels <=3/10 Baseline:  Goal status: 9/25 achieved at this point and will continue to progress   LONG TERM GOALS: Target date:POC date  1.  Bil UE ROM within 10 deg of opp UE Baseline:  Goal status: ONGOING 12/6- ER/IR met goal, still very limited for flexion and ABD   2.  UE MMT 90% Rt to Lt Baseline:  Goal status: IN PROGRESS 12/6 maybe 75-80% (see MMT above)  3.  Able to lift dishes to mid and high shelves without incr in pain Baseline:  Goal status: IN PROGRESS  12/6- improving but not with security, still not yet    PLAN: PT FREQUENCY: 1x/week  PT DURATION: 3 weeks   PLANNED INTERVENTIONS: Therapeutic exercises, Therapeutic activity, Neuromuscular re-education, Patient/Family education, Joint mobilization, Joint manipulation, Orthotic/Fit training, DME instructions, Aquatic Therapy, Dry Needling, Electrical stimulation, Spinal manipulation, Spinal mobilization, Cryotherapy, Moist heat, Compression bandaging, Taping, Traction, Ultrasound, Ionotophoresis 37m/ml Dexamethasone, Manual therapy, and Re-evaluation  PLAN FOR NEXT SESSION:  re-assess, extension of POC vs DC   Nocole Zammit U PT DPT PN2  10/20/2022, 11:43 AM   Referring diagnosis?  Adhesive capsulitis of left shoulder Treatment diagnosis? (if different than referring diagnosis) Chronic left shoulder pain  Chronic right shoulder pain What was this (referring dx) caused by? _0  Surgery _1  Fall _2  Ongoing issue _3  Arthritis _4  Other: Sudden onset   Laterality: _5  Rt _6  Lt _7  Both  Check all possible CPT codes:  *CHOOSE 10 OR LESS*    _8  97110 (Therapeutic Exercise)  _9  92507 (SLP Treatment)  _10  942876(Neuro Re-ed)   _11  92526 (Swallowing Treatment)   _12  981157(Gait Training)   _13  926203(Cognitive Training, 1st 15 minutes) _14  97140 (Manual Therapy)   _15  97130 (Cognitive Training, each add'l 15 minutes)  _16  97164 (Re-evaluation)                              _17  Other, List CPT Code ____________  _18  955974(Therapeutic Activities)     _19  916384(Self Care)   _20  All codes above (97110 - 97535)  _21  953646(Mechanical Traction)  _22  97014 (E-stim Unattended)  _23  97032 (E-stim manual)  _24   15400 (Ionto)  _0  86761 (Ultrasound) _1  97750 (Physical Performance Training) _2  [id number] (Aquatic Therapy) _3  97016 (Vasopneumatic Device) _4  [id number] (Paraffin) _5  97034 (Contrast Bath) _6  97597 (Wound Care 1st 20 sq cm) _7  97598 (Wound Care each add'l 20 sq cm) _8  97760 (Orthotic Fabrication,  Fitting, Training Initial) _9  [id number] (Prosthetic Management and Training Initial) _10  [id number] (Orthotic or Prosthetic Training/ Modification Subsequent)

## 2022-10-27 ENCOUNTER — Encounter (HOSPITAL_BASED_OUTPATIENT_CLINIC_OR_DEPARTMENT_OTHER): Payer: Self-pay

## 2022-10-27 ENCOUNTER — Ambulatory Visit (HOSPITAL_BASED_OUTPATIENT_CLINIC_OR_DEPARTMENT_OTHER): Payer: Medicare PPO | Admitting: Physical Therapy

## 2022-10-27 NOTE — Therapy (Incomplete)
OUTPATIENT PHYSICAL THERAPY Shoulder treatment    Patient Name: Alison Best MRN: [id number] DOB:[birth year], 67 y.o., female Today's Date: 10/27/2022            Past Medical History:  Diagnosis Date   Anal fissure    Arthritis    Bursitis    Constipation    COPD (chronic obstructive pulmonary disease) ([place])    Diverticulitis of colon 02/2015   Ectopic pregnancy    Emphysema of lung (HCC)    Frozen shoulder    Heart murmur    Hernia, abdominal    Hyperlipidemia    Hyperthyroidism    Infertility, female    Irregular heartbeat    Joint pain    Obesity    Osteoarthritis    Pneumonia    Pre-diabetes    Sleep apnea    Swallowing difficulty    Thyroid disease    hyperthyroid   Past Surgical History:  Procedure Laterality Date   APPENDECTOMY     CESAREAN SECTION     COLONOSCOPY  09/10/2010   per Dr. Acquanetta Sit, diverticulosis of descending colon and sigmoid, internal hemorrhoids, repeat in 10 yrs   COLOSTOMY N/A 02/20/2015   Procedure: COLOSTOMY;  Surgeon: Jackolyn Confer, MD;  Location: [facility];  Service: General;  Laterality: N/A;   COLOSTOMY REVISION  02/20/2015   Procedure: Sigmoid Colectomy;  Surgeon: Jackolyn Confer, MD;  Location: [facility];  Service: General;;   CYSTOSCOPY N/A 02/20/2015   Procedure: Consuela Mimes;  Surgeon: Kathie Rhodes, MD;  [facility]: [facility];  Service: Urology;  Laterality: N/A;   DILATION AND CURETTAGE OF UTERUS     x3   ECTOPIC PREGNANCY SURGERY     ILEO LOOP COLOSTOMY CLOSURE N/A 09/26/2015   Procedure: LAPAROSCOPIC LYSIS OF ADHESIONS, COLOSTOMY CLOSURE;  Surgeon: Jackolyn Confer, MD;  [facility];  Service: General;  Laterality: N/A;   LAPAROTOMY N/A 02/20/2015   Procedure: Emergency EXPLORATORY and drainiage of interabdominal abcesses;  Surgeon: Jackolyn Confer, MD;  [facility];  Service: General;  Laterality: N/A;   SALPINGOOPHORECTOMY Left 02/20/2015   Procedure: SALPINGO OOPHORECTOMY;  Surgeon: Jackolyn Confer,  MD;  [facility];  Service: General;  Laterality: Left;   [place] Left 02/23/2022   Procedure: TOTAL HIP ARTHROPLASTY ANTERIOR APPROACH;  Surgeon: Paralee Cancel, MD;  [facility];  Service: Orthopedics;  Laterality: Left;   Patient Active Problem List   Diagnosis Date Noted   Vitamin D deficiency 03/26/2022   S/P total left hip arthroplasty 02/23/2022   Adjustment disorder with depressed mood 11/18/2021   COVID-19 virus infection 07/07/2021   Psoriasis 01/15/2021   Chronic pain of left knee 01/15/2021   Ventral hernia without obstruction or gangrene 01/15/2021   Class 3 severe obesity with serious comorbidity and body mass index (BMI) of 40.0 to 44.9 in adult [facility]) 01/15/2019   Acne rosacea 05/10/2016   Multinodular goiter 02/27/2016   Subclinical thyrotoxicosis 02/26/2016   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/p colostomy reversal 09/26/15 09/26/2015   Prediabetes 07/30/2015   Perforation of sigmoid colon ([place]) 02/20/2015   Diverticulitis of large intestine with abscess without bleeding 02/18/2015   OSA (obstructive sleep apnea) 05/09/2013   GASTROENTERITIS 08/18/2010   RAYNAUD'S SYNDROME 02/17/2009   PLANTAR FASCIITIS 02/17/2009   Asthma 06/12/2008   Hyperlipidemia 09/04/2007   DEVIATED SEPTUM 09/04/2007   ALLERGIC RHINITIS 09/04/2007   GERD 09/04/2007   OSTEOARTHRITIS 09/04/2007   HEADACHE 09/04/2007    PCP:  Alysia Penna  A, MD  REFERRING PROVIDER: Paralee Cancel, MD  REFERRING DIAG:  SHOULDER: M75.02 (ICD-10-CM) - Adhesive capsulitis of left shoulder  THERAPY DIAG:  No diagnosis found.  Rationale for Evaluation and Treatment Rehabilitation  ONSET DATE: 02/23/22  SUBJECTIVE:   SUBJECTIVE STATEMENT:   *** recert/dc   PERTINENT HISTORY: Abdominal hernia, smoker, COPD, A-Fib, 01/2015 colon resection  PAIN:  Are you having pain? Yes: NPRS scale: 2/10 Pain location: L shoulder  Pain description: dull ache  Aggravating  factors: moving it in certain directions  Relieving factors: stretches    FALLS:  Has patient fallen in last 6 months? No  LIVING ENVIRONMENT: Lives with: lives with their family Lives in: House/apartment Stairs: level home, 5 steps to enter Has following equipment at home: Single point cane and Environmental consultant - 4 wheeled  OCCUPATION: retired  PLOF: Independent with basic ADLs  PATIENT GOALS : Hip: return to walking and exercise; pt has a international trip coming up in March of 2024 and would like to be able to walk Shoulder: reaching overhead/at eye level, sleep on left side   OBJECTIVE:   DIAGNOSTIC FINDINGS:   IMPRESSION: Intraoperative images during left total hip arthroplasty. Normal alignment.  PATIENT SURVEYS:  FOTO shoulder 48  POSTURE: increased lumbar lordosis, increased thoracic kyphosis, rounded shoulders/forward head   LOWER EXTREMITY ROM:  WFL without incr in pain  LOWER EXTREMITY MMT:  07/05/22: gross 5/5 with exception of Lt hip flexion limited due to hernia   UPPER EXTREMITY MMT:  MMT Right eval Left eval L/R ROM 10/16 Left 11/13 Left 12/6 Right 12/6  Shoulder flexion- at elbow 22.4 11.3 Shoulder ROM at GHJ - 138/140   4/5 5/5  Shoulder extension        Shoulder abduction- at elbow 16.8 10.1 Shoulder ROM at GHJ - 108/ 145 142 4/5 5/5  Shoulder adduction        Shoulder extension        Shoulder internal rotation   C6     Shoulder external rotation   T12     Grip strength 30 30       (Blank rows = not tested)  UPPER EXTREMITY ROM: Lt UE 75% compared to Rt UE  12/6- shoulder AROM flexion R 165* L 120*, ABD R 165* L 121*, FER C7 B, FIR T7 B   GAIT: 9/11: mild vaulting over Lt LE   TODAY'S TREATMENT:  Treatment                            10/27/22:  ***   10/20/22  TherEx  UBE L1.5 x3 min forward/3 min backward  Supine flexion 2x10 2# L UE  Sidelying ABD LUE 2x10 2#  Serratus punches 2# 2x10  Sidelying ER 2# 2x10 towel pinched  under elbow  Scap retractions x15 yellow TB Shoulder extensions x10 yellow TB   Manual  PROM/stretches into flexion and ABD LUE, ER stretching    12/13  TherEx  UBE L1 x3 min forward/3 min backwards Door flexion stretch with pillow case 10x10 second holds Table top shoulder ABD stretch 10x10 second holds  Doorway ER stretch with towel 10x5 second holds      Manual  Grade II-III inferior GH mobs, GH oscillation + light distraction, PROM/stretches into flexion and ABD LUE, ER stretching to tolerance at 20* ABD  - supine flexion AROM 142*, seated 125*     PATIENT EDUCATION:  Education details: reassessment next session,  moist heat and consider seeing massage therapist for mm spasm/trigger point management, possible DOMS after today and management strategies  Person educated: Patient Education method: Explanation, Demonstration, Tactile cues, Verbal cues, and Handouts Education comprehension: verbalized understanding, returned demonstration, verbal cues required, and tactile cues required   HOME EXERCISE PROGRAM: Access Code:  [email or web address] - hip [email or web address] - shoulder   ASSESSMENT:  CLINICAL IMPRESSION:            ***  HIP: OBJECTIVE IMPAIRMENTS Abnormal gait, cardiopulmonary status limiting activity, decreased activity tolerance, decreased balance, decreased endurance, decreased mobility, difficulty walking, decreased ROM, decreased strength, hypomobility, increased muscle spasms, impaired flexibility, improper body mechanics, postural dysfunction, and pain.   ACTIVITY LIMITATIONS carrying, lifting, bending, standing, squatting, stairs, transfers, bed mobility, bathing, toileting, dressing, and locomotion level  PARTICIPATION LIMITATIONS: meal prep, cleaning, laundry, interpersonal relationship, driving, shopping, community activity, yard work, and exercise  PERSONAL FACTORS Age, Fitness, Past/current experiences, Time since onset of injury/illness/exacerbation, and 3+  comorbidities:    are also affecting patient's functional outcome.   REHAB POTENTIAL: Good  CLINICAL DECISION MAKING: Evolving/moderate complexity  EVALUATION COMPLEXITY: Moderate  Shoulder: OBJECTIVE IMPAIRMENTS decreased activity tolerance, decreased ROM, decreased strength, increased muscle spasms, impaired flexibility, impaired UE functional use, improper body mechanics, postural dysfunction, and pain.   ACTIVITY LIMITATIONS carrying, lifting, and reach over head  PARTICIPATION LIMITATIONS: meal prep, cleaning, and laundry  PERSONAL FACTORS 1 comorbidity: h/o adhesive capsulitis, known spurring are also affecting patient's functional outcome.   REHAB POTENTIAL: Good  CLINICAL DECISION MAKING: Evolving/moderate complexity  EVALUATION COMPLEXITY: Moderate   GOALS:   SHORT TERM GOALS:  1.  Independent with stretching program while maintaining pain levels <=3/10 Baseline:  Goal status: 9/25 achieved at this point and will continue to progress   LONG TERM GOALS: Target date:POC date  1.  Bil UE ROM within 10 deg of opp UE Baseline:  Goal status: ONGOING 12/6- ER/IR met goal, still very limited for flexion and ABD   2.  UE MMT 90% Rt to Lt Baseline:  Goal status: IN PROGRESS 12/6 maybe 75-80% (see MMT above)  3.  Able to lift dishes to mid and high shelves without incr in pain Baseline:  Goal status: IN PROGRESS 12/6- improving but not with security, still not yet    PLAN: PT FREQUENCY: 1x/week  PT DURATION: 3 weeks   PLANNED INTERVENTIONS: Therapeutic exercises, Therapeutic activity, Neuromuscular re-education, Patient/Family education, Joint mobilization, Joint manipulation, Orthotic/Fit training, DME instructions, Aquatic Therapy, Dry Needling, Electrical stimulation, Spinal manipulation, Spinal mobilization, Cryotherapy, Moist heat, Compression bandaging, Taping, Traction, Ultrasound, Ionotophoresis 36m/ml Dexamethasone, Manual therapy, and Re-evaluation  PLAN  FOR NEXT SESSION:  re-assess, extension of POC vs DC   Saidee Geremia C. Kaidan Harpster PT, DPT 10/27/22 10:21 AM    Referring diagnosis?  Adhesive capsulitis of left shoulder Treatment diagnosis? (if different than referring diagnosis) Chronic left shoulder pain  Chronic right shoulder pain What was this (referring dx) caused by? _0  Surgery _1  Fall _2  Ongoing issue _3  Arthritis _4  Other: Sudden onset   Laterality: _5  Rt _6  Lt _7  Both  Check all possible CPT codes:  *CHOOSE 10 OR LESS*    _8  97110 (Therapeutic Exercise)  _9  92507 (SLP Treatment)  _10  [id number](Neuro Re-ed)   _11  92526 (Swallowing Treatment)   _12  911914(Gait Training)   _13  978295(Cognitive Training, 1st 15 minutes) _14  97140 (Manual Therapy)   _15  97130 (Cognitive Training, each add'l 15 minutes)  _16  97164 (Re-evaluation)                              _17   Other, List CPT Code ____________  _0  45913 (Therapeutic Activities)     _1  68599 (Self Care)   _2  All codes above (97110 - 97535)  _3  23414 (Mechanical Traction)  _4  97014 (E-stim Unattended)  _5  97032 (E-stim manual)  _6  97033 (Ionto)  _7  43601 (Ultrasound) _8  97750 (Physical Performance Training) _9  65800 (Aquatic Therapy) _10  97016 (Vasopneumatic Device) _11  63494 (Paraffin) _12  94473 (Contrast Bath) _13  97597 (Wound Care 1st 20 sq cm) _14  97598 (Wound Care each add'l 20 sq cm) _15  97760 (Orthotic Fabrication, Fitting, Training Initial) _16  [id number] (Prosthetic Management and Training Initial) _17  [id number] (Orthotic or Prosthetic Training/ Modification Subsequent)

## 2022-10-28 ENCOUNTER — Ambulatory Visit (HOSPITAL_BASED_OUTPATIENT_CLINIC_OR_DEPARTMENT_OTHER): Payer: Medicare PPO | Attending: Orthopedic Surgery | Admitting: Physical Therapy

## 2022-10-28 ENCOUNTER — Encounter (HOSPITAL_BASED_OUTPATIENT_CLINIC_OR_DEPARTMENT_OTHER): Payer: Self-pay | Admitting: Physical Therapy

## 2022-10-28 DIAGNOSIS — M6281 Muscle weakness (generalized): Secondary | ICD-10-CM | POA: Diagnosis present

## 2022-10-28 DIAGNOSIS — M25512 Pain in left shoulder: Secondary | ICD-10-CM | POA: Insufficient documentation

## 2022-10-28 DIAGNOSIS — G8929 Other chronic pain: Secondary | ICD-10-CM | POA: Insufficient documentation

## 2022-10-28 NOTE — Therapy (Signed)
OUTPATIENT PHYSICAL THERAPY Shoulder treatment    Patient Name: Alison Best MRN: [id number] DOB:[birth year], 67 y.o., female Today's Date: 10/28/2022   PT End of Session - 10/28/22 1437     Visit Number 23    Number of Visits 23    Date for PT Re-Evaluation 10/27/22    Authorization Type [employer] (now)    PT Start Time 79    PT Stop Time 1500    PT Time Calculation (min) 24 min    Activity Tolerance Patient tolerated treatment well    Behavior During Therapy [facility] for tasks assessed/performed                     Past Medical History:  Diagnosis Date   Anal fissure    Arthritis    Bursitis    Constipation    COPD (chronic obstructive pulmonary disease) ([place])    Diverticulitis of colon 02/2015   Ectopic pregnancy    Emphysema of lung ([place])    Frozen shoulder    Heart murmur    Hernia, abdominal    Hyperlipidemia    Hyperthyroidism    Infertility, female    Irregular heartbeat    Joint pain    Obesity    Osteoarthritis    Pneumonia    Pre-diabetes    Sleep apnea    Swallowing difficulty    Thyroid disease    hyperthyroid   Past Surgical History:  Procedure Laterality Date   APPENDECTOMY     CESAREAN SECTION     COLONOSCOPY  09/10/2010   per Dr. Acquanetta Sit, diverticulosis of descending colon and sigmoid, internal hemorrhoids, repeat in 10 yrs   COLOSTOMY N/A 02/20/2015   Procedure: COLOSTOMY;  Surgeon: Jackolyn Confer, MD;  [facility];  Service: General;  Laterality: N/A;   COLOSTOMY REVISION  02/20/2015   Procedure: Sigmoid Colectomy;  Surgeon: Jackolyn Confer, MD;  [facility]: [facility];  Service: General;;   CYSTOSCOPY N/A 02/20/2015   Procedure: Consuela Mimes;  Surgeon: Kathie Rhodes, MD;  [facility];  Service: Urology;  Laterality: N/A;   DILATION AND CURETTAGE OF UTERUS     x3   ECTOPIC PREGNANCY SURGERY     ILEO LOOP COLOSTOMY CLOSURE N/A 09/26/2015   Procedure: LAPAROSCOPIC LYSIS OF ADHESIONS, COLOSTOMY CLOSURE;  Surgeon:  Jackolyn Confer, MD;  [facility];  Service: General;  Laterality: N/A;   LAPAROTOMY N/A 02/20/2015   Procedure: Emergency EXPLORATORY and drainiage of interabdominal abcesses;  Surgeon: Jackolyn Confer, MD;  [facility];  Service: General;  Laterality: N/A;   SALPINGOOPHORECTOMY Left 02/20/2015   Procedure: SALPINGO OOPHORECTOMY;  Surgeon: Jackolyn Confer, MD;  [facility];  Service: General;  Laterality: Left;   [place] Left 02/23/2022   Procedure: TOTAL HIP ARTHROPLASTY ANTERIOR APPROACH;  Surgeon: Paralee Cancel, MD;  [facility];  Service: Orthopedics;  Laterality: Left;   Patient Active Problem List   Diagnosis Date Noted   Vitamin D deficiency 03/26/2022   S/P total left hip arthroplasty 02/23/2022   Adjustment disorder with depressed mood 11/18/2021   COVID-19 virus infection 07/07/2021   Psoriasis 01/15/2021   Chronic pain of left knee 01/15/2021   Ventral hernia without obstruction or gangrene 01/15/2021   Class 3 severe obesity with serious comorbidity and body mass index (BMI) of 40.0 to 44.9 in adult [facility]) 01/15/2019   Acne rosacea 05/10/2016   Multinodular goiter 02/27/2016   Subclinical thyrotoxicosis 02/26/2016   Colostomy  in place [facility]) s/p colostomy reversal 09/26/15 09/26/2015   Prediabetes 07/30/2015   Perforation of sigmoid colon ([place]) 02/20/2015   Diverticulitis of large intestine with abscess without bleeding 02/18/2015   OSA (obstructive sleep apnea) 05/09/2013   GASTROENTERITIS 08/18/2010   RAYNAUD'S SYNDROME 02/17/2009   PLANTAR FASCIITIS 02/17/2009   Asthma 06/12/2008   Hyperlipidemia 09/04/2007   DEVIATED SEPTUM 09/04/2007   ALLERGIC RHINITIS 09/04/2007   GERD 09/04/2007   OSTEOARTHRITIS 09/04/2007   HEADACHE 09/04/2007    PCP:  Laurey Morale, MD  REFERRING PROVIDER: Paralee Cancel, MD  REFERRING DIAG:  SHOULDER: M75.02 (ICD-10-CM) - Adhesive capsulitis of left shoulder  THERAPY DIAG:   Chronic left shoulder pain  Muscle weakness (generalized)  Rationale for Evaluation and Treatment Rehabilitation  ONSET DATE: 02/23/22  SUBJECTIVE:   SUBJECTIVE STATEMENT:   I can reach overhead putting dishes again with confidence and I can reach into washer/dryer.   PERTINENT HISTORY: Abdominal hernia, smoker, COPD, A-Fib, 01/2015 colon resection  PAIN:  Are you having pain? Yes: NPRS scale: 2/10 Pain location: L shoulder  Pain description: dull ache  Aggravating factors: moving it in certain directions  Relieving factors: stretches    FALLS:  Has patient fallen in last 6 months? No  LIVING ENVIRONMENT: Lives with: lives with their family Lives in: House/apartment Stairs: level home, 5 steps to enter Has following equipment at home: Single point cane and Environmental consultant - 4 wheeled  OCCUPATION: retired  PLOF: Independent with basic ADLs  PATIENT GOALS : Hip: return to walking and exercise; pt has a international trip coming up in March of 2024 and would like to be able to walk Shoulder: reaching overhead/at eye level, sleep on left side   OBJECTIVE:   DIAGNOSTIC FINDINGS:   IMPRESSION: Intraoperative images during left total hip arthroplasty. Normal alignment.  PATIENT SURVEYS:  FOTO shoulder 48 FOTO 10/28/22: 66  POSTURE: increased lumbar lordosis, increased thoracic kyphosis, rounded shoulders/forward head   LOWER EXTREMITY ROM:  WFL without incr in pain  LOWER EXTREMITY MMT:  07/05/22: gross 5/5 with exception of Lt hip flexion limited due to hernia   UPPER EXTREMITY MMT:  MMT Right eval Left eval L/R ROM 10/16 Left 11/13 Left 12/6 Right 12/6  Shoulder flexion- at elbow 22.4 11.3 Shoulder ROM at GHJ - 138/140   4/5 5/5  Shoulder extension        Shoulder abduction- at elbow 16.8 10.1 Shoulder ROM at GHJ - 108/ 145 142 4/5 5/5  Shoulder adduction        Shoulder extension        Shoulder internal rotation   C6     Shoulder external rotation    T12     Grip strength 30 30       (Blank rows = not tested)  UPPER EXTREMITY ROM: Lt UE 75% compared to Rt UE  12/6- shoulder AROM flexion R 165* L 120*, ABD R 165* L 121*, FER C7 B, FIR T7 B   10/28/22: Left UE: Flexion 138, ABD 120, IR behind back T12  GAIT: 9/11: mild vaulting over Lt LE   TODAY'S TREATMENT:  Treatment                            10/27/22:  See plan   10/20/22  TherEx  UBE L1.5 x3 min forward/3 min backward  Supine flexion 2x10 2# L UE  Sidelying ABD LUE 2x10 2#  Serratus punches 2# 2x10  Sidelying ER 2# 2x10 towel pinched under elbow  Scap retractions x15 yellow TB Shoulder extensions x10 yellow TB   Manual  PROM/stretches into flexion and ABD LUE, ER stretching    12/13  TherEx  UBE L1 x3 min forward/3 min backwards Door flexion stretch with pillow case 10x10 second holds Table top shoulder ABD stretch 10x10 second holds  Doorway ER stretch with towel 10x5 second holds      Manual  Grade II-III inferior GH mobs, GH oscillation + light distraction, PROM/stretches into flexion and ABD LUE, ER stretching to tolerance at 20* ABD  - supine flexion AROM 142*, seated 125*     PATIENT EDUCATION:  Education details: reassessment next session,  moist heat and consider seeing massage therapist for mm spasm/trigger point management, possible DOMS after today and management strategies  Person educated: Patient Education method: Explanation, Demonstration, Tactile cues, Verbal cues, and Handouts Education comprehension: verbalized understanding, returned demonstration, verbal cues required, and tactile cues required   HOME EXERCISE PROGRAM: Access Code:  [email or web address] - hip [email or web address] - shoulder   ASSESSMENT:  CLINICAL IMPRESSION:            Pt has made excellent progress at this point and is prepared for d/c to independent program. We discussed the importance of continued exercise and mobility with the shoulder to maintain/further improve  function. I encouraged her to reach out with any further questions or needs.    HIP: OBJECTIVE IMPAIRMENTS Abnormal gait, cardiopulmonary status limiting activity, decreased activity tolerance, decreased balance, decreased endurance, decreased mobility, difficulty walking, decreased ROM, decreased strength, hypomobility, increased muscle spasms, impaired flexibility, improper body mechanics, postural dysfunction, and pain.   ACTIVITY LIMITATIONS carrying, lifting, bending, standing, squatting, stairs, transfers, bed mobility, bathing, toileting, dressing, and locomotion level  PARTICIPATION LIMITATIONS: meal prep, cleaning, laundry, interpersonal relationship, driving, shopping, community activity, yard work, and exercise  PERSONAL FACTORS Age, Fitness, Past/current experiences, Time since onset of injury/illness/exacerbation, and 3+ comorbidities:    are also affecting patient's functional outcome.   REHAB POTENTIAL: Good  CLINICAL DECISION MAKING: Evolving/moderate complexity  EVALUATION COMPLEXITY: Moderate  Shoulder: OBJECTIVE IMPAIRMENTS decreased activity tolerance, decreased ROM, decreased strength, increased muscle spasms, impaired flexibility, impaired UE functional use, improper body mechanics, postural dysfunction, and pain.   ACTIVITY LIMITATIONS carrying, lifting, and reach over head  PARTICIPATION LIMITATIONS: meal prep, cleaning, and laundry  PERSONAL FACTORS 1 comorbidity: h/o adhesive capsulitis, known spurring are also affecting patient's functional outcome.   REHAB POTENTIAL: Good  CLINICAL DECISION MAKING: Evolving/moderate complexity  EVALUATION COMPLEXITY: Moderate   GOALS:   SHORT TERM GOALS:  1.  Independent with stretching program while maintaining pain levels <=3/10 Baseline:  Goal status: 9/25 achieved at this point and will continue to progress   LONG TERM GOALS: Target date:POC date  1.  Bil UE ROM within 10 deg of opp UE Baseline:  Goal  status: achieved  2.  UE MMT 90% Rt to Lt Baseline: Rt 15lb, Lt 12lb ( 80%) Goal status: improved but not met  3.  Able to lift dishes to mid and high shelves without incr in pain Baseline:  Goal status: partially met, more confident but uncomfortable   PLAN: PT FREQUENCY: 1x/week  PT DURATION: 3 weeks   PLANNED INTERVENTIONS: Therapeutic exercises, Therapeutic activity, Neuromuscular re-education, Patient/Family education, Joint mobilization, Joint manipulation, Orthotic/Fit training, DME instructions, Aquatic Therapy, Dry Needling, Electrical stimulation, Spinal manipulation, Spinal mobilization, Cryotherapy, Moist heat, Compression bandaging, Taping, Traction, Ultrasound, Ionotophoresis 94m/ml Dexamethasone, Manual therapy, and Re-evaluation  PLAN FOR NEXT SESSION:  re-assess, extension of POC vs DC   Lincy Belles C. Saory Carriero PT, DPT 10/28/22 3:15 PM    Referring diagnosis?  Adhesive capsulitis of left shoulder Treatment diagnosis? (if different than referring diagnosis) Chronic left shoulder pain  Chronic right shoulder pain What was this (referring dx) caused by? _0  Surgery _1  Fall _2  Ongoing issue _3  Arthritis _4  Other: Sudden onset   Laterality: _5  Rt _6  Lt _7  Both  Check all possible CPT codes:  *CHOOSE 10 OR LESS*    _8  97110 (Therapeutic Exercise)  _9  92507 (SLP Treatment)  _10  97416 (Neuro Re-ed)   _11  92526 (Swallowing Treatment)   _12  38453 (Gait Training)   _13  64680 (Cognitive Training, 1st 15 minutes) _14  97140 (Manual Therapy)   _15  97130 (Cognitive Training, each add'l 15 minutes)  _16  97164 (Re-evaluation)                              _17  Other, List CPT Code ____________  _18  32122 (Therapeutic Activities)     _19  48250 (Self Care)   _20  All codes above (97110 - 97535)  _21  97012 (Mechanical Traction)  _22  97014 (E-stim Unattended)  _23  97032 (E-stim manual)  _24  97033 (Ionto)  _25  97035 (Ultrasound) _26  97750 (Physical Performance Training) _27  03704  (Aquatic Therapy) _28  97016 (Vasopneumatic Device) _29  88891 (Paraffin) _30  69450 (Contrast Bath) _31  97597 (Wound Care 1st 20 sq cm) _32  97598 (Wound Care each add'l 20 sq cm) _33  97760 (Orthotic Fabrication, Fitting, Training Initial) _34  [id number] (Prosthetic Management and Training Initial) _35  [id number] (Orthotic or Prosthetic Training/ Modification Subsequent)

## 2022-10-30 ENCOUNTER — Other Ambulatory Visit: Payer: Self-pay | Admitting: Family Medicine

## 2022-11-03 ENCOUNTER — Encounter (HOSPITAL_BASED_OUTPATIENT_CLINIC_OR_DEPARTMENT_OTHER): Payer: Medicare PPO | Admitting: Physical Therapy

## 2022-11-17 ENCOUNTER — Telehealth: Payer: Medicare PPO | Admitting: Nurse Practitioner

## 2022-11-17 DIAGNOSIS — J0100 Acute maxillary sinusitis, unspecified: Secondary | ICD-10-CM | POA: Diagnosis not present

## 2022-11-17 MED ORDER — AMOXICILLIN-POT CLAVULANATE 875-125 MG PO TABS: 1.0000 | ORAL_TABLET | Freq: Two times a day (BID) | ORAL | 0 refills | Status: DC

## 2022-11-17 NOTE — Progress Notes (Signed)
E-Visit for Sinus Problems  We are sorry that you are not feeling well.  Here is how we plan to help!  Based on what you have shared with me it looks like you have sinusitis.  Sinusitis is inflammation and infection in the sinus cavities of the head.  Based on your presentation I believe you most likely have Acute Bacterial Sinusitis.  This is an infection caused by bacteria and is treated with antibiotics. I have prescribed Augmentin '875mg'$ /'125mg'$  one tablet twice daily with food, for 7 days. You may use an oral decongestant such as Mucinex D or if you have glaucoma or high blood pressure use plain Mucinex. Saline nasal spray help and can safely be used as often as needed for congestion.  If you develop worsening sinus pain, fever or notice severe headache and vision changes, or if symptoms are not better after completion of antibiotic, please schedule an appointment with a health care provider.    Use triple antibiotic ointment OTC in nostril daily  Sinus infections are not as easily transmitted as other respiratory infection, however we still recommend that you avoid close contact with loved ones, especially the very young and elderly.  Remember to wash your hands thoroughly throughout the day as this is the number one way to prevent the spread of infection!  Home Care: Only take medications as instructed by your medical team. Complete the entire course of an antibiotic. Do not take these medications with alcohol. A steam or ultrasonic humidifier can help congestion.  You can place a towel over your head and breathe in the steam from hot water coming from a faucet. Avoid close contacts especially the very young and the elderly. Cover your mouth when you cough or sneeze. Always remember to wash your hands.  Get Help Right Away If: You develop worsening fever or sinus pain. You develop a severe head ache or visual changes. Your symptoms persist after you have completed your treatment  plan.  Make sure you Understand these instructions. Will watch your condition. Will get help right away if you are not doing well or get worse.  Thank you for choosing an e-visit.  Your e-visit answers were reviewed by a board certified advanced clinical practitioner to complete your personal care plan. Depending upon the condition, your plan could have included both over the counter or prescription medications.  Please review your pharmacy choice. Make sure the pharmacy is open so you can pick up prescription now. If there is a problem, you may contact your provider through [employer] and have the prescription routed to another pharmacy.  Your safety is important to [name]. If you have drug allergies check your prescription carefully.   For the next 24 hours you can use MyChart to ask questions about today's visit, request a non-urgent call back, or ask for a work or school excuse. You will get an email in the next two days asking about your experience. I hope that your e-visit has been valuable and will speed your recovery.  Mary-Margaret Hassell Done, FNP   5-10 minutes spent reviewing and documenting in chart.

## 2022-11-26 ENCOUNTER — Other Ambulatory Visit: Payer: Self-pay

## 2022-11-26 MED ORDER — DILTIAZEM HCL ER COATED BEADS 120 MG PO CP24: 120.0000 mg | ORAL_CAPSULE | Freq: Every day | ORAL | 0 refills | Status: DC

## 2022-12-29 ENCOUNTER — Other Ambulatory Visit: Payer: Self-pay | Admitting: Cardiovascular Disease

## 2023-01-17 NOTE — Progress Notes (Signed)
Cardiology Office Note:    Date:  01/26/2023   ID:  Dollene Cleveland [place], DOB 1956-06-10, MRN [id number]  PCP:  Laurey Morale, MD  [facility] Cardiologist:  Lauree Chandler, MD     Referring MD: Laurey Morale, MD   Chief Complaint:  Follow-up     History of Present Illness:   Marvis Gaton is a 67 y.o. female   with  history of paroxysmal atrial fibrillation, hyperlipidemia, sleep apnea, hyperthyroidism, carotid artery disease and tobacco abuse.   During her hospitalization in 2016 for perforated diverticulitis, her EKG showed T wave inversions and ST depression.Echo with normal LV function and TR with no other valve disease. Stress myoview 05/28/15 with no ischemia. When she presented for her echo she was in rapid atrial fib.Found to be hyperthyroid. She was started on Toprol and ASA given CHADS VASC score of 1.Carotid dopplers August 2016 with mild bilateral carotid disease.  She did not tolerate beta blockers due to fatigue. She has had no known recurrence of atrial fibrillation since 2016. Cardiac monitor March 2021 with PACs, PVCs, atrial tachycardia. Echo April 2021 with LVEF=60-65%, no significant valve disease.       Patient last saw Dr. Angelena Form 10/2021 and was cleared for surgery. Smoking cessation advised.  Patient comes in for f/u. Had hip surgery last year and has a complicated hernia and will need surgery at [facility] in future. Trying to lose weight that she gained.  Every couple of months she feels a palpitations but not regularly.  Not using CPAP-stopped when she lost weight but now that her weight is up needs to go back to Dr. Annamaria Boots. Still smoking 8 cigarettes daily. She knows she needs to quit. Doing aqua aerobics once a week. 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 Has some ankle edema. Has gotten more fast food recently with her mother in laws passing.  Numbness and tingling in hands when she wakes up-better with CBD  oil on hands.      Past Medical History:  Diagnosis Date   Anal fissure    Arthritis    Bursitis    Constipation    COPD (chronic obstructive pulmonary disease)    Diverticulitis of colon 02/2015   Ectopic pregnancy    Emphysema of lung    Frozen shoulder    Heart murmur    Hernia, abdominal    Hyperlipidemia    Hyperthyroidism    Infertility, female    Irregular heartbeat    Joint pain    Obesity    Osteoarthritis    Pneumonia    Pre-diabetes    Sleep apnea    Swallowing difficulty    Thyroid disease    hyperthyroid   Current Medications: Current Meds  Medication Sig   acetaZOLAMIDE ER (DIAMOX) 500 MG capsule Take 1 capsule (500 mg total) by mouth 2 (two) times daily. When travelling at high altitudes   atorvastatin (LIPITOR) 20 MG tablet TAKE 1 TABLET(20 MG) BY MOUTH DAILY   betamethasone dipropionate 0.05 % cream Apply 1 application. topically 2 (two) times daily as needed (eczema).   celecoxib (CELEBREX) 200 MG capsule Take 1 capsule (200 mg total) by mouth 2 (two) times daily.   Coenzyme Q10 (CO Q 10 PO) Take 200 mg by mouth daily.   COLLAGEN PO Take 15 mLs by mouth daily. Liquid   diltiazem (CARDIZEM CD) 120 MG 24 hr capsule TAKE 1 CAPSULE(120 MG) BY MOUTH DAILY   docusate  sodium (COLACE) 100 MG capsule Take 1 capsule (100 mg total) by mouth 2 (two) times daily.   fenofibrate micronized (LOFIBRA) 134 MG capsule TAKE 1 CAPSULE(134 MG) BY MOUTH DAILY   furosemide (LASIX) 20 MG tablet Take 1 tablet (20 mg total) by mouth daily as needed for fluid or edema.   ketoconazole (NIZORAL) 2 % cream Apply 1 application. topically 2 (two) times daily as needed for irritation (fungal infections).   metFORMIN (GLUCOPHAGE) 500 MG tablet TAKE 1 TABLET(500 MG) BY MOUTH TWICE DAILY   Multiple Vitamins-Minerals (MULTIVITAMIN/EXTRA VITAMIN D3 PO) Take 1 tablet by mouth daily.   mupirocin ointment (BACTROBAN) 2 % Apply inside both nostrils twice a day   Omega-3 Fatty Acids (FISH OIL  PO) Take by mouth.   OVER THE COUNTER MEDICATION Take 1 tablet by mouth daily. Saffron   OVER THE COUNTER MEDICATION Take 320 mg by mouth daily. phytoceramides   OVER THE COUNTER MEDICATION Take 2 capsules by mouth daily. Memory and Brain   OVER THE COUNTER MEDICATION Take 1 capsule by mouth daily. Cruciferous extract   OVER THE COUNTER MEDICATION Take 1 capsule by mouth daily. fruit full antioxidant   polyethylene glycol (MIRALAX / GLYCOLAX) 17 g packet Take 17 g by mouth daily as needed for mild constipation.   UNABLE TO FIND Med Name: vegetable blend    Allergies:   Levofloxacin in d5w, Mango flavor, and Bupropion   Social History   Tobacco Use   Smoking status: Every Day    Packs/day: 0.25    Years: 40.00    Additional pack years: 0.00    Total pack years: 10.00    Types: Cigarettes   Smokeless tobacco: Never  Vaping Use   Vaping Use: Never used  Substance Use Topics   Alcohol use: Not Currently   Drug use: No    Family Hx: The patient's family history includes Allergies in her brother; Breast cancer in her maternal grandmother and mother; CAD in her father; COPD in an other family member; Cancer in an other family member; Colon polyps in her father; Diabetes in her brother; Diverticulitis in her father; Heart attack in her maternal grandfather and paternal grandfather; Heart disease in her father and another family member; High blood pressure in her father and mother; Kidney disease in her mother; Lung cancer in her father; Obesity in her mother; Skin cancer in her father; Sleep apnea in her father and mother; Stroke in her father and another family member.  ROS     Physical Exam:    VS:  BP 122/66   Pulse 66   Ht 5' (1.524 m)   Wt 218 lb 3.2 oz (99 kg)   LMP 02/23/2012   SpO2 97%   BMI 42.61 kg/m     Wt Readings from Last 3 Encounters:  01/26/23 218 lb 3.2 oz (99 kg)  05/27/22 200 lb (90.7 kg)  04/05/22 200 lb 12.8 oz (91.1 kg)    Physical Exam  GEN: Obese, in  no acute distress  Neck: no JVD, carotid bruits, or masses Cardiac:RRR; no murmurs, rubs, or gallops  Respiratory:  clear to auscultation bilaterally, normal work of breathing GI: soft, nontender, nondistended, + BS Ext: trace edema, otherwise without cyanosis, clubbing, Good distal pulses bilaterally Neuro:  Alert and Oriented x 3, Psych: euthymic mood, full affect        EKGs/Labs/Other Test Reviewed:    EKG:  EKG is   ordered today.  The ekg ordered today demonstrates NSR  normal EKG  Recent Labs: 03/26/2022: ALT 24; BUN 22; Creatinine, Ser 0.86; Hemoglobin 13.9; Platelets 317.0; Potassium 3.9; Sodium 141; TSH 0.62   Recent Lipid Panel Recent Labs    03/26/22 0854  CHOL 80  TRIG 95.0  HDL 32.40*  VLDL 19.0  LDLCALC 28     Prior CV Studies:    Echo April 2021:  1. Left ventricular ejection fraction, by estimation, is 60 to 65%. The  left ventricle has normal function. The left ventricle has no regional  wall motion abnormalities. Left ventricular diastolic parameters were  normal.   2. Right ventricular systolic function is normal. The right ventricular  size is normal. There is mildly elevated pulmonary artery systolic  pressure.   3. Left atrial size was mildly dilated.   4. The mitral valve is normal in structure. Trivial mitral valve  regurgitation. No evidence of mitral stenosis.   5. The aortic valve is tricuspid. Aortic valve regurgitation is not  visualized. Mild aortic valve sclerosis is present, with no evidence of  aortic valve stenosis.   6. The inferior vena cava is normal in size with greater than 50%  respiratory variability, suggesting right atrial pressure of 3 mmHg.      Risk Assessment/Calculations/Metrics:              ASSESSMENT & PLAN:   No problem-specific Assessment & Plan notes found for this encounter.    Paroxysmal atrial fibrillation: This occurred in the setting of hyperthyroidism. No recurrence since then. She has not been on  long term anti-coagulation. Rare palpitations. Continue Cardizem.     Carotid disease: Mild bilateral disease by dopplers November 2020. Will hold off on repeating at this time  HLD-on lipitor but didn't tolerate vascepa-GERD. LDL 28 trig 95 03/2022.    Tobacco abuse: Smoking cessation is advised.    Leg edema-getting extra salt in her diet recently-take lasix for 3 days and 2 gm sodium diet  Obesity-exercise and weight loss discussed.           Dispo:  No follow-ups on file.   Medication Adjustments/Labs and Tests Ordered: Current medicines are reviewed at length with the patient today.  Concerns regarding medicines are outlined above.  Tests Ordered: Orders Placed This Encounter  Procedures   EKG 12-Lead   Medication Changes: No orders of the defined types were placed in this encounter.  Signed, Ermalinda Barrios, PA-C  01/26/2023 12:50 PM    [address], [place], [place]  [address] Phone: [phone]; Fax: [phone]

## 2023-01-26 ENCOUNTER — Encounter: Payer: Self-pay | Admitting: Physician Assistant

## 2023-01-26 ENCOUNTER — Ambulatory Visit: Payer: Medicare PPO | Attending: Physician Assistant | Admitting: Physician Assistant

## 2023-01-26 VITALS — BP 122/66 | HR 66 | Ht 60.0 in | Wt 218.2 lb

## 2023-01-26 DIAGNOSIS — Z720 Tobacco use: Secondary | ICD-10-CM | POA: Diagnosis not present

## 2023-01-26 DIAGNOSIS — I480 Paroxysmal atrial fibrillation: Secondary | ICD-10-CM

## 2023-01-26 DIAGNOSIS — E785 Hyperlipidemia, unspecified: Secondary | ICD-10-CM | POA: Diagnosis not present

## 2023-01-26 DIAGNOSIS — M7989 Other specified soft tissue disorders: Secondary | ICD-10-CM

## 2023-01-26 DIAGNOSIS — R0989 Other specified symptoms and signs involving the circulatory and respiratory systems: Secondary | ICD-10-CM | POA: Diagnosis not present

## 2023-01-26 MED ORDER — DILTIAZEM HCL ER COATED BEADS 120 MG PO CP24: ORAL_CAPSULE | ORAL | 3 refills | Status: DC

## 2023-01-26 NOTE — Addendum Note (Signed)
Addended by: Mendel Ryder on: 01/26/2023 01:00 PM   Modules accepted: Orders

## 2023-01-26 NOTE — Patient Instructions (Signed)
Medication Instructions:  Your physician recommends that you continue on your current medications as directed. Please refer to the Current Medication list given to you today.  *If you need a refill on your cardiac medications before your next appointment, please call your pharmacy*   Lab Work: None ordered   If you have labs (blood work) drawn today and your tests are completely normal, you will receive your results only by: [place] (if you have MyChart) OR A paper copy in the mail If you have any lab test that is abnormal or we need to change your treatment, we will call you to review the results.   Testing/Procedures: None ordered    Follow-Up: At [facility], you and your health needs are our priority.  As part of our continuing mission to provide you with exceptional heart care, we have created designated Provider Care Teams.  These Care Teams include your primary Cardiologist (physician) and Advanced Practice Providers (APPs -  Physician Assistants and Nurse Practitioners) who all work together to provide you with the care you need, when you need it.  We recommend signing up for the patient portal called "MyChart".  Sign up information is provided on this After Visit Summary.  MyChart is used to connect with patients for Virtual Visits (Telemedicine).  Patients are able to view lab/test results, encounter notes, upcoming appointments, etc.  Non-urgent messages can be sent to your provider as well.   To learn more about what you can do with MyChart, go to [email or web address].    Your next appointment:   12 month(s)  Provider:   Lauree Chandler, MD     Other Instructions Steps to Quit Smoking Smoking tobacco is the leading cause of preventable death. It can affect almost every organ in the body. Smoking puts you and people around you at risk for many serious, long-lasting (chronic) diseases. Quitting smoking can be hard, but it is one of the best things  that you can do for your health. It is never too late to quit. Do not give up if you cannot quit the first time. Some people need to try many times to quit. Do your best to stick to your quit plan, and talk with your doctor if you have any questions or concerns. How do I get ready to quit? Pick a date to quit. Set a date within the next 2 weeks to give you time to prepare. Write down the reasons why you are quitting. Keep this list in places where you will see it often. Tell your family, friends, and co-workers that you are quitting. Their support is important. Talk with your doctor about the choices that may help you quit. Find out if your health insurance will pay for these treatments. Know the people, places, things, and activities that make you want to smoke (triggers). Avoid them. What first steps can I take to quit smoking? Throw away all cigarettes at home, at work, and in your car. Throw away the things that you use when you smoke, such as ashtrays and lighters. Clean your car. Empty the ashtray. Clean your home, including curtains and carpets. What can I do to help me quit smoking? Talk with your doctor about taking medicines and seeing a counselor. You are more likely to succeed when you do both. If you are pregnant or breastfeeding: Talk with your doctor about counseling or other ways to quit smoking. Do not take medicine to help you quit smoking unless your doctor tells you  to. Quit right away Quit smoking completely, instead of slowly cutting back on how much you smoke over a period of time. Stopping smoking right away may be more successful than slowly quitting. Go to counseling. In-person is best if this is an option. You are more likely to quit if you go to counseling sessions regularly. Take medicine You may take medicines to help you quit. Some medicines need a prescription, and some you can buy over-the-counter. Some medicines may contain a drug called nicotine to replace  the nicotine in cigarettes. Medicines may: Help you stop having the desire to smoke (cravings). Help to stop the problems that come when you stop smoking (withdrawal symptoms). Your doctor may ask you to use: Nicotine patches, gum, or lozenges. Nicotine inhalers or sprays. Non-nicotine medicine that you take by mouth. Find resources Find resources and other ways to help you quit smoking and remain smoke-free after you quit. They include: Online chats with a Social worker. Phone quitlines. Printed Furniture conservator/restorer. Support groups or group counseling. Text messaging programs. Mobile phone apps. Use apps on your mobile phone or tablet that can help you stick to your quit plan. Examples of free services include Quit Guide from the CDC and smokefree.gov  What can I do to make it easier to quit?  Talk to your family and friends. Ask them to support and encourage you. Call a phone quitline, such as 1-800-QUIT-NOW, reach out to support groups, or work with a Social worker. Ask people who smoke to not smoke around you. Avoid places that make you want to smoke, such as: Bars. Parties. Smoke-break areas at work. Spend time with people who do not smoke. Lower the stress in your life. Stress can make you want to smoke. Try these things to lower stress: Getting regular exercise. Doing deep-breathing exercises. Doing yoga. Meditating. What benefits will I see if I quit smoking? Over time, you may have: A better sense of smell and taste. Less coughing and sore throat. A slower heart rate. Lower blood pressure. Clearer skin. Better breathing. Fewer sick days. Summary Quitting smoking can be hard, but it is one of the best things that you can do for your health. Do not give up if you cannot quit the first time. Some people need to try many times to quit. When you decide to quit smoking, make a plan to help you succeed. Quit smoking right away, not slowly over a period of time. When you start  quitting, get help and support to keep you smoke-free. This information is not intended to replace advice given to you by your health care provider. Make sure you discuss any questions you have with your health care provider. Document Revised: 10/02/2021 Document Reviewed: 10/02/2021 Elsevier Patient Education  [place].   Two Gram Sodium Diet 2000 mg  What is Sodium? Sodium is a mineral found naturally in many foods. The most significant source of sodium in the diet is table salt, which is about 40% sodium.  Processed, convenience, and preserved foods also contain a large amount of sodium.  The body needs only 500 mg of sodium daily to function,  A normal diet provides more than enough sodium even if you do not use salt.  Why Limit Sodium? A build up of sodium in the body can cause thirst, increased blood pressure, shortness of breath, and water retention.  Decreasing sodium in the diet can reduce edema and risk of heart attack or stroke associated with high blood pressure.  Keep in  mind that there are many other factors involved in these health problems.  Heredity, obesity, lack of exercise, cigarette smoking, stress and what you eat all play a role.  General Guidelines: Do not add salt at the table or in cooking.  One teaspoon of salt contains over 2 grams of sodium. Read food labels Avoid processed and convenience foods Ask your dietitian before eating any foods not dicussed in the menu planning guidelines Consult your physician if you wish to use a salt substitute or a sodium containing medication such as antacids.  Limit milk and milk products to 16 oz (2 cups) per day.  Shopping Hints: READ LABELS!! "Dietetic" does not necessarily mean low sodium. Salt and other sodium ingredients are often added to foods during processing.    Menu Planning Guidelines Food Group Choose More Often Avoid  Beverages (see also the milk group All fruit juices, low-sodium, salt-free vegetables  juices, low-sodium carbonated beverages Regular vegetable or tomato juices, commercially softened water used for drinking or cooking  Breads and Cereals Enriched white, wheat, rye and pumpernickel bread, hard rolls and dinner rolls; muffins, cornbread and waffles; most dry cereals, cooked cereal without added salt; unsalted crackers and breadsticks; low sodium or homemade bread crumbs Bread, rolls and crackers with salted tops; quick breads; instant hot cereals; pancakes; commercial bread stuffing; self-rising flower and biscuit mixes; regular bread crumbs or cracker crumbs  Desserts and Sweets Desserts and sweets mad with mild should be within allowance Instant pudding mixes and cake mixes  Fats Butter or margarine; vegetable oils; unsalted salad dressings, regular salad dressings limited to 1 Tbs; light, sour and heavy cream Regular salad dressings containing bacon fat, bacon bits, and salt pork; snack dips made with instant soup mixes or processed cheese; salted nuts  Fruits Most fresh, frozen and canned fruits Fruits processed with salt or sodium-containing ingredient (some dried fruits are processed with sodium sulfites        Vegetables Fresh, frozen vegetables and low- sodium canned vegetables Regular canned vegetables, sauerkraut, pickled vegetables, and others prepared in brine; frozen vegetables in sauces; vegetables seasoned with ham, bacon or salt pork  Condiments, Sauces, Miscellaneous  Salt substitute with physician's approval; pepper, herbs, spices; vinegar, lemon or lime juice; hot pepper sauce; garlic powder, onion powder, low sodium soy sauce (1 Tbs.); low sodium condiments (ketchup, chili sauce, mustard) in limited amounts (1 tsp.) fresh ground horseradish; unsalted tortilla chips, pretzels, potato chips, popcorn, salsa (1/4 cup) Any seasoning made with salt including garlic salt, celery salt, onion salt, and seasoned salt; sea salt, rock salt, kosher salt; meat tenderizers;  monosodium glutamate; mustard, regular soy sauce, barbecue, sauce, chili sauce, teriyaki sauce, steak sauce, Worcestershire sauce, and most flavored vinegars; canned gravy and mixes; regular condiments; salted snack foods, olives, picles, relish, horseradish sauce, catsup   Food preparation: Try these seasonings Meats:    Pork Sage, onion Serve with applesauce  Chicken Poultry seasoning, thyme, parsley Serve with cranberry sauce  Lamb Curry powder, rosemary, garlic, thyme Serve with mint sauce or jelly  Veal Marjoram, basil Serve with current jelly, cranberry sauce  Beef Pepper, bay leaf Serve with dry mustard, unsalted chive butter  Fish [place] leaf, dill Serve with unsalted lemon butter, unsalted parsley butter  Vegetables:    Asparagus Lemon juice   Broccoli Lemon juice   Carrots Mustard dressing parsley, mint, nutmeg, glazed with unsalted butter and sugar   Green beans Marjoram, lemon juice, nutmeg,dill seed   Tomatoes Basil, marjoram, onion  Spice /blend for "Salt Shaker" 4 tsp ground thyme 1 tsp ground sage 3 tsp ground rosemary 4 tsp ground marjoram   Test your knowledge A product that says "Salt Free" may still contain sodium. True or False Garlic Powder and Hot Pepper Sauce an be used as alternative seasonings.True or False Processed foods have more sodium than fresh foods.  True or False Canned Vegetables have less sodium than froze True or False   WAYS TO DECREASE YOUR SODIUM INTAKE Avoid the use of added salt in cooking and at the table.  Table salt (and other prepared seasonings which contain salt) is probably one of the greatest sources of sodium in the diet.  Unsalted foods can gain flavor from the sweet, sour, and butter taste sensations of herbs and spices.  Instead of using salt for seasoning, try the following seasonings with the foods listed.  Remember: how you use them to enhance natural food flavors is limited only by your creativity... Allspice-Meat, fish, eggs,  fruit, peas, red and yellow vegetables Almond Extract-Fruit baked goods Anise Seed-Sweet breads, fruit, carrots, beets, cottage cheese, cookies (tastes like licorice) Basil-Meat, fish, eggs, vegetables, rice, vegetables salads, soups, sauces Bay Leaf-Meat, fish, stews, poultry Burnet-Salad, vegetables (cucumber-like flavor) Caraway Seed-Bread, cookies, cottage cheese, meat, vegetables, cheese, rice Cardamon-Baked goods, fruit, soups Celery Powder or seed-Salads, salad dressings, sauces, meatloaf, soup, bread.Do not use  celery salt Chervil-Meats, salads, fish, eggs, vegetables, cottage cheese (parsley-like flavor) Chili Power-Meatloaf, chicken cheese, corn, eggplant, egg dishes Chives-Salads cottage cheese, egg dishes, soups, vegetables, sauces Cilantro-Salsa, casseroles Cinnamon-Baked goods, fruit, pork, lamb, chicken, carrots Cloves-Fruit, baked goods, fish, pot roast, green beans, beets, carrots Coriander-Pastry, cookies, meat, salads, cheese (lemon-orange flavor) Cumin-Meatloaf, fish,cheese, eggs, cabbage,fruit pie (caraway flavor) [employer], fruit, eggs, fish, poultry, cottage cheese, vegetables Dill Seed-Meat, cottage cheese, poultry, vegetables, fish, salads, bread Fennel Seed-Bread, cookies, apples, pork, eggs, fish, beets, cabbage, cheese, Licorice-like flavor Garlic-(buds or powder) Salads, meat, poultry, fish, bread, butter, vegetables, potatoes.Do not  use garlic salt Ginger-Fruit, vegetables, baked goods, meat, fish, poultry Horseradish Root-Meet, vegetables, butter Lemon Juice or Extract-Vegetables, fruit, tea, baked goods, fish salads Mace-Baked goods fruit, vegetables, fish, poultry (taste like nutmeg) Maple Extract-Syrups Marjoram-Meat, chicken, fish, vegetables, breads, green salads (taste like Sage) Mint-Tea, lamb, sherbet, vegetables, desserts, carrots, cabbage Mustard, Dry or Seed-Cheese, eggs, meats, vegetables, poultry Nutmeg-Baked goods, fruit, chicken,  eggs, vegetables, desserts Onion Powder-Meat, fish, poultry, vegetables, cheese, eggs, bread, rice salads (Do not use   Onion salt) Orange Extract-Desserts, baked goods Oregano-Pasta, eggs, cheese, onions, pork, lamb, fish, chicken, vegetables, green salads Paprika-Meat, fish, poultry, eggs, cheese, vegetables Parsley Flakes-Butter, vegetables, meat fish, poultry, eggs, bread, salads (certain forms may   Contain sodium Pepper-Meat fish, poultry, vegetables, eggs Peppermint Extract-Desserts, baked goods Poppy Seed-Eggs, bread, cheese, fruit dressings, baked goods, noodles, vegetables, cottage  [employer], poultry, meat, fish, cauliflower, turnips,eggs bread Saffron-Rice, bread, veal, chicken, fish, eggs Sage-Meat, fish, poultry, onions, eggplant, tomateos, pork, stews Savory-Eggs, salads, poultry, meat, rice, vegetables, soups, pork Tarragon-Meat, poultry, fish, eggs, butter, vegetables (licorice-like flavor)  Thyme-Meat, poultry, fish, eggs, vegetables, (clover-like flavor), sauces, soups Tumeric-Salads, butter, eggs, fish, rice, vegetables (saffron-like flavor) Vanilla Extract-Baked goods, candy Vinegar-Salads, vegetables, meat marinades Walnut Extract-baked goods, candy   2. Choose your Foods Wisely   The following is a list of foods to avoid which are high in sodium:  Meats-Avoid all smoked, canned, salt cured, dried and kosher meat and fish as well as Anchovies   [place]ox Berniece Salines  Luncheon meats:Bologna, Liverwurst, Pastrami Canned meat or fish  Marinated herring Caviar    Pepperoni Corned Beef   Pizza Dried chipped beef  Salami Frozen breaded fish or meat Salt pork Frankfurters or hot dogs  Sardines Gefilte fish   Sausage Ham (boiled ham, Proscuitto Smoked butt    spiced ham)   Spam      TV Dinners Vegetables Canned vegetables (Regular) Relish Canned mushrooms  Sauerkraut Olives    Tomato juice Pickles  Bakery and Dessert  Products Canned puddings  Cream pies Cheesecake   Decorated cakes Cookies  Beverages/Juices Tomato juice, regular  Gatorade   V-8 vegetable juice, regular  Breads and Cereals Biscuit mixes   Salted potato chips, corn chips, pretzels Bread stuffing mixes  Salted crackers and rolls Pancake and waffle mixes Self-rising flour  Seasonings Accent    Meat sauces Barbecue sauce  Meat tenderizer Catsup    Monosodium glutamate (MSG) Celery salt   Onion salt Chili sauce   Prepared mustard Garlic salt   Salt, seasoned salt, sea salt Gravy mixes   Soy sauce Horseradish   Steak sauce Ketchup   [place] sauce Lite salt    Teriyaki sauce Marinade mixes   Worcestershire sauce  Others Baking powder   Cocoa and cocoa mixes Baking soda   Commercial casserole mixes Candy-caramels, chocolate  Dehydrated soups    Bars, fudge,nougats  Instant rice and pasta mixes Canned broth or soup  Maraschino cherries Cheese, aged and processed cheese and cheese spreads  Learning Assessment Quiz  Indicated T (for True) or F (for False) for each of the following statements:  _____ Fresh fruits and vegetables and unprocessed grains are generally low in sodium _____ Water may contain a considerable amount of sodium, depending on the source _____ You can always tell if a food is high in sodium by tasting it _____ Certain laxatives my be high in sodium and should be avoided unless prescribed   by a physician or pharmacist _____ Salt substitutes may be used freely by anyone on a sodium restricted diet _____ Sodium is present in table salt, food additives and as a natural component of   most foods _____ Table salt is approximately 90% sodium _____ Limiting sodium intake may help prevent excess fluid accumulation in the body _____ On a sodium-restricted diet, seasonings such as bouillon soy sauce, and    cooking wine should be used in place of table salt _____ On an ingredient list, a product which lists monosodium  glutamate as the first   ingredient is an appropriate food to include on a low sodium diet  Circle the best answer(s) to the following statements (Hint: there may be more than one correct answer)  11. On a low-sodium diet, some acceptable snack items are:    A. Olives  F. Bean dip   K. Grapefruit juice    B. Salted Pretzels G. Commercial Popcorn   L. Canned peaches    C. Carrot Sticks  H. Bouillon   M. Unsalted nuts   D. [place] fries  I. Peanut butter crackers N. Salami   E. Sweet pickles J. Tomato Juice   O. Pizza  12.  Seasonings that may be used freely on a reduced - sodium diet include   A. Lemon wedges F.Monosodium glutamate K. Celery seed    B.Soysauce   G. Pepper   L. Mustard powder   C. Sea salt  H. Cooking wine  M. Onion flakes   D. Vinegar  E. Prepared  horseradish N. Salsa   E. Sage   J. Worcestershire sauce  O. Chutney    Mediterranean Diet A Mediterranean diet refers to food and lifestyle choices that are based on the traditions of countries located on the [employer]. It focuses on eating more fruits, vegetables, whole grains, beans, nuts, seeds, and heart-healthy fats, and eating less dairy, meat, eggs, and processed foods with added sugar, salt, and fat. This way of eating has been shown to help prevent certain conditions and improve outcomes for people who have chronic diseases, like kidney disease and heart disease. What are tips for following this plan? Reading food labels Check the serving size of packaged foods. For foods such as rice and pasta, the serving size refers to the amount of cooked product, not dry. Check the total fat in packaged foods. Avoid foods that have saturated fat or trans fats. Check the ingredient list for added sugars, such as corn syrup. Shopping  Buy a variety of foods that offer a balanced diet, including: Fresh fruits and vegetables (produce). Grains, beans, nuts, and seeds. Some of these may be available in unpackaged  forms or large amounts (in bulk). Fresh seafood. Poultry and eggs. Low-fat dairy products. Buy whole ingredients instead of prepackaged foods. Buy fresh fruits and vegetables in-season from local farmers markets. Buy plain frozen fruits and vegetables. If you do not have access to quality fresh seafood, buy precooked frozen shrimp or canned fish, such as tuna, salmon, or sardines. Stock your pantry so you always have certain foods on hand, such as olive oil, canned tuna, canned tomatoes, rice, pasta, and beans. Cooking Cook foods with extra-virgin olive oil instead of using butter or other vegetable oils. Have meat as a side dish, and have vegetables or grains as your main dish. This means having meat in small portions or adding small amounts of meat to foods like pasta or stew. Use beans or vegetables instead of meat in common dishes like chili or lasagna. Experiment with different cooking methods. Try roasting, broiling, steaming, and sauting vegetables. Add frozen vegetables to soups, stews, pasta, or rice. Add nuts or seeds for added healthy fats and plant protein at each meal. You can add these to yogurt, salads, or vegetable dishes. Marinate fish or vegetables using olive oil, lemon juice, garlic, and fresh herbs. Meal planning Plan to eat one vegetarian meal one day each week. Try to work up to two vegetarian meals, if possible. Eat seafood two or more times a week. Have healthy snacks readily available, such as: Vegetable sticks with hummus. Greek yogurt. Fruit and nut trail mix. Eat balanced meals throughout the week. This includes: Fruit: 2-3 servings a day. Vegetables: 4-5 servings a day. Low-fat dairy: 2 servings a day. Fish, poultry, or lean meat: 1 serving a day. Beans and legumes: 2 or more servings a week. Nuts and seeds: 1-2 servings a day. Whole grains: 6-8 servings a day. Extra-virgin olive oil: 3-4 servings a day. Limit red meat and sweets to only a few servings  a month. Lifestyle  Cook and eat meals together with your family, when possible. Drink enough fluid to keep your urine pale yellow. Be physically active every day. This includes: Aerobic exercise like running or swimming. Leisure activities like gardening, walking, or housework. Get 7-8 hours of sleep each night. If recommended by your health care provider, drink red wine in moderation. This means 1 glass a day for nonpregnant women and 2 glasses a day for men. A glass of  wine equals 5 oz (150 mL). What foods should I eat? Fruits Apples. Apricots. Avocado. Berries. Bananas. Cherries. Dates. Figs. Grapes. Lemons. Melon. Oranges. Peaches. Plums. Pomegranate. Vegetables Artichokes. Beets. Broccoli. Cabbage. Carrots. Eggplant. Green beans. Chard. Kale. Spinach. Onions. Leeks. Peas. Squash. Tomatoes. Peppers. Radishes. Grains Whole-grain pasta. Brown rice. Bulgur wheat. Polenta. Couscous. Whole-wheat bread. [place]. Meats and other proteins Beans. Almonds. Sunflower seeds. Pine nuts. Peanuts. [place]. Salmon. Scallops. Shrimp. [place]. Tilapia. Clams. Oysters. Eggs. Poultry without skin. Dairy Low-fat milk. Cheese. Greek yogurt. Fats and oils Extra-virgin olive oil. Avocado oil. Grapeseed oil. Beverages Water. Red wine. Herbal tea. Sweets and desserts Greek yogurt with honey. Baked apples. Poached pears. Trail mix. Seasonings and condiments Basil. Cilantro. Coriander. Cumin. Mint. Parsley. Sage. Rosemary. Tarragon. Garlic. Oregano. Thyme. Pepper. Balsamic vinegar. Tahini. Hummus. Tomato sauce. Olives. Mushrooms. The items listed above may not be a complete list of foods and beverages you can eat. Contact a dietitian for more information. What foods should I limit? This is a list of foods that should be eaten rarely or only on special occasions. Fruits Fruit canned in syrup. Vegetables Deep-fried potatoes (french fries). Grains Prepackaged pasta or rice dishes. Prepackaged cereal with  added sugar. Prepackaged snacks with added sugar. Meats and other proteins Beef. Pork. Lamb. Poultry with skin. Hot dogs. Berniece Salines. Dairy Ice cream. Sour cream. Whole milk. Fats and oils Butter. Canola oil. Vegetable oil. Beef fat (tallow). Lard. Beverages Juice. Sugar-sweetened soft drinks. Beer. Liquor and spirits. Sweets and desserts Cookies. Cakes. Pies. Candy. Seasonings and condiments Mayonnaise. Pre-made sauces and marinades. The items listed above may not be a complete list of foods and beverages you should limit. Contact a dietitian for more information. Summary The Mediterranean diet includes both food and lifestyle choices. Eat a variety of fresh fruits and vegetables, beans, nuts, seeds, and whole grains. Limit the amount of red meat and sweets that you eat. If recommended by your health care provider, drink red wine in moderation. This means 1 glass a day for nonpregnant women and 2 glasses a day for men. A glass of wine equals 5 oz (150 mL). This information is not intended to replace advice given to you by your health care provider. Make sure you discuss any questions you have with your health care provider. Document Revised: 11/16/2019 Document Reviewed: 09/13/2019 Elsevier Patient Education  [employer].

## 2023-01-31 ENCOUNTER — Other Ambulatory Visit: Payer: Self-pay | Admitting: Internal Medicine

## 2023-02-10 ENCOUNTER — Ambulatory Visit: Payer: Medicare PPO | Admitting: Internal Medicine

## 2023-02-10 ENCOUNTER — Encounter: Payer: Self-pay | Admitting: Internal Medicine

## 2023-02-10 VITALS — BP 120/70 | HR 83 | Ht 60.0 in | Wt 219.2 lb

## 2023-02-10 DIAGNOSIS — Z8639 Personal history of other endocrine, nutritional and metabolic disease: Secondary | ICD-10-CM | POA: Diagnosis not present

## 2023-02-10 DIAGNOSIS — E042 Nontoxic multinodular goiter: Secondary | ICD-10-CM | POA: Diagnosis not present

## 2023-02-10 DIAGNOSIS — R7303 Prediabetes: Secondary | ICD-10-CM

## 2023-02-10 DIAGNOSIS — E0590 Thyrotoxicosis, unspecified without thyrotoxic crisis or storm: Secondary | ICD-10-CM

## 2023-02-10 MED ORDER — METFORMIN HCL 500 MG PO TABS: ORAL_TABLET | ORAL | 3 refills | Status: DC

## 2023-02-10 NOTE — Progress Notes (Signed)
Patient ID: Alison Best [facility], female   DOB: Feb 19, 1956, 67 y.o.   MRN: [id number]  HPI  Alison Best is a 67 y.o.-year-old female, returning for f/u for subclinical thyrotoxicosis and MNG and also prediabetes. Last visit 1 year ago.    Interim history: Before last visit, she joined the gym  - pool, yoga, but had to stop due to left hip pain.  She had left hip replacement in 02/2022 and was in physical therapy. She does aquatic exercises now 1x week, tries to increase to 2x a week. She continues to have some abdominal pain, chronic after her abdominal surgery and diverticulitis.  The pain felt better after she started to go to the gym, but unfortunately she cannot exercise for now.  She will need to have ventral hernia surgery.  She sees a Careers adviser at [employer].  She will need to quit smoking before the surgery and also improve her BMI. She has occasional palpitations - very seldom. Also, occasional hot flushes.  She has fluid retention and weight gain. Also numbness in hands.  Subclinical thyrotoxicosis: -Patient with mild Graves' disease.  We started methimazole in 05/2015.   In 01/2021, we stopped methimazole.  Reviewed patient's TFTs: Lab Results  Component Value Date   TSH 0.62 03/26/2022   TSH 0.84 01/25/2022   TSH 2.12 05/06/2021   TSH 1.71 04/02/2021   TSH 2.08 01/27/2021   TSH 3.770 06/26/2020   TSH 2.88 10/05/2019   TSH 2.26 03/23/2019   TSH 2.44 09/20/2018   TSH 4.06 11/09/2017   FREET4 1.03 03/26/2022   FREET4 1.07 01/25/2022   FREET4 0.88 05/06/2021   FREET4 1.17 04/02/2021   FREET4 0.72 01/27/2021   FREET4 1.07 06/26/2020   FREET4 0.82 10/05/2019   FREET4 0.77 03/23/2019   FREET4 0.82 09/20/2018   FREET4 0.81 11/09/2017   Lab Results  Component Value Date   T3FREE 3.2 03/26/2022   T3FREE 3.4 01/25/2022   T3FREE 4.6 (H) 05/06/2021   T3FREE 5.6 (H) 04/02/2021   T3FREE 2.8 01/27/2021   T3FREE 3.1 06/26/2020   T3FREE 3.0 10/05/2019   T3FREE 2.9  03/23/2019   T3FREE 2.9 09/20/2018   T3FREE 3.1 11/09/2017    Lab Results  Component Value Date   TSI 111 06/18/2014   07/10/2014 Uptake and scan: The 24 hr uptake by the thyroid gland is 28.5%. Normal 24 hr uptakeis 10-30 %. On thyroid imaging, the thyroid activity appears mildly heterogeneous in the right lobe with a possible cold nodule inferiorly. The left lobe activity appears homogeneous.  Thyroid nodules:  -Diagnosed in 06/2014  Reviewedprevious ultrasound reports that showed several bilateral thyroid nodules, with a right dominant 1.5 x 1.1 x 1.3 cm nodule.  We performed another thyroid ultrasound on 10/24/2018, which showed dissolution of the previous thyroid nodules.  The thyroid gland appears heterogeneous (please see assessment section).  Pt denies: - feeling nodules in neck - hoarseness - dysphagia - choking  Prediabetes:  Reviewed HbA1c levels: Lab Results  Component Value Date   HGBA1C 5.5 03/26/2022   HGBA1C 6.0 01/25/2022   HGBA1C 6.1 01/27/2021   She continues: - Metformin 500 mg 2x a day with meals.   Kidney function was normal at home Lab Results  Component Value Date   BUN 22 03/26/2022   BUN 19 02/25/2022   CREATININE 0.86 03/26/2022   CREATININE 0.84 02/25/2022   She has a history of hypertriglyceridemia: Lab Results  Component Value Date   CHOL 80 03/26/2022   HDL  32.40 (L) 03/26/2022   LDLCALC 28 03/26/2022   LDLDIRECT 48.0 01/27/2021   TRIG 95.0 03/26/2022   CHOLHDL 2 03/26/2022  She is off Vascepa (had GERD from this).  She is on Lipitor 20. She continues fenofibrate 134. A recent CT scan from 07/2021 showed hepatic steatosis.  She was previously seen in the weight management clinic and she started to lose weight but did not continue the program. She was on Topiramate >> less carbohydrate cravings. She had colon resection in summer 2016, after an episode of diverticulitis >> she is s/p colonostomy closure.  She also has a h/o  vitamin D deficiency: Lab Results  Component Value Date   VD25OH 44.21 03/26/2022   VD25OH 29.99 (L) 01/25/2022   VD25OH 62.6 06/26/2020   VD25OH 59.61 10/05/2019   VD25OH 27.3 (L) 01/11/2019   On MVI - ~2000 units vit D.  At last visit I recommended to increase the dose to 3000 units daily. She is taking this now.  ROS: + see HPI + joint aches  I reviewed pt's medications, allergies, PMH, social hx, family hx, and changes were documented in the history of present illness. Otherwise, unchanged from my initial visit note.  Past Medical History:  Diagnosis Date   Anal fissure    Arthritis    Bursitis    Constipation    COPD (chronic obstructive pulmonary disease)    Diverticulitis of colon 02/2015   Ectopic pregnancy    Emphysema of lung    Frozen shoulder    Heart murmur    Hernia, abdominal    Hyperlipidemia    Hyperthyroidism    Infertility, female    Irregular heartbeat    Joint pain    Obesity    Osteoarthritis    Pneumonia    Pre-diabetes    Sleep apnea    Swallowing difficulty    Thyroid disease    hyperthyroid   Past Surgical History:  Procedure Laterality Date   APPENDECTOMY     CESAREAN SECTION     COLONOSCOPY  09/10/2010   per Dr. Wandalee Ferdinand, diverticulosis of descending colon and sigmoid, internal hemorrhoids, repeat in 10 yrs   COLOSTOMY N/A 02/20/2015   Procedure: COLOSTOMY;  Surgeon: Avel Peace, MD;  Location: [facility];  Service: General;  Laterality: N/A;   COLOSTOMY REVISION  02/20/2015   Procedure: Sigmoid Colectomy;  Surgeon: Avel Peace, MD;  Location: [facility];  Service: General;;   CYSTOSCOPY N/A 02/20/2015   Procedure: Bluford Kaufmann;  Surgeon: Ihor Gully, MD;  Location: [facility];  Service: Urology;  Laterality: N/A;   DILATION AND CURETTAGE OF UTERUS     x3   ECTOPIC PREGNANCY SURGERY     ILEO LOOP COLOSTOMY CLOSURE N/A 09/26/2015   Procedure: LAPAROSCOPIC LYSIS OF ADHESIONS, COLOSTOMY CLOSURE;  Surgeon: Avel Peace, MD;  Location:  [facility];  Service: General;  Laterality: N/A;   LAPAROTOMY N/A 02/20/2015   Procedure: Emergency EXPLORATORY and drainiage of interabdominal abcesses;  Surgeon: Avel Peace, MD;  Location: [facility];  Service: General;  Laterality: N/A;   SALPINGOOPHORECTOMY Left 02/20/2015   Procedure: SALPINGO OOPHORECTOMY;  Surgeon: Avel Peace, MD;  Location: [facility];  Service: General;  Laterality: Left;   TONSILLECTOMY  1980   TOTAL HIP ARTHROPLASTY Left 02/23/2022   Procedure: TOTAL HIP ARTHROPLASTY ANTERIOR APPROACH;  Surgeon: Durene Romans, MD;  Location: [facility];  Service: Orthopedics;  Laterality: Left;   Social History   Socioeconomic History   Marital status: Married    Spouse  name: Not on file   Number of children: 1   Years of education: Not on file   Highest education level: Not on file  Occupational History   Occupation: Geneticist, 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[employer]: [employer]  Tobacco Use   Smoking status: Every Day    Packs/day: 0.25    Years: 40.00    Additional pack years: 0.00    Total pack years: 10.00    Types: Cigarettes   Smokeless tobacco: Never  Vaping Use   Vaping Use: Never used  Substance and Sexual Activity   Alcohol use: Not Currently   Drug use: No   Sexual activity: Not Currently    Partners: Male    Birth control/protection: Post-menopausal    Comment: 1ST intercourse- 21, partners- 3  Other Topics Concern   Not on file  Social History Narrative   Not on file   Social Determinants of Health   Financial Resource Strain: Not on file  Food Insecurity: Not on file  Transportation Needs: Not on file  Physical Activity: Not on file  Stress: Not on file  Social Connections: Not on file  Intimate Partner Violence: Not on file   Current Outpatient Medications on File Prior to Visit  Medication Sig Dispense Refill   acetaZOLAMIDE ER (DIAMOX) 500 MG capsule Take 1 capsule (500 mg total) by mouth 2 (two) times daily. When travelling  at high altitudes 60 capsule 2   amoxicillin-clavulanate (AUGMENTIN) 875-125 MG tablet Take 1 tablet by mouth 2 (two) times daily. (Patient not taking: Reported on 01/26/2023) 14 tablet 0   atorvastatin (LIPITOR) 20 MG tablet TAKE 1 TABLET(20 MG) BY MOUTH DAILY 90 tablet 3   betamethasone dipropionate 0.05 % cream Apply 1 application. topically 2 (two) times daily as needed (eczema). 45 g 5   celecoxib (CELEBREX) 200 MG capsule Take 1 capsule (200 mg total) by mouth 2 (two) times daily. 60 capsule 0   Coenzyme Q10 (CO Q 10 PO) Take 200 mg by mouth daily.     COLLAGEN PO Take 15 mLs by mouth daily. Liquid     diltiazem (CARDIZEM CD) 120 MG 24 hr capsule TAKE 1 CAPSULE(120 MG) BY MOUTH DAILY 90 capsule 3   docusate sodium (COLACE) 100 MG capsule Take 1 capsule (100 mg total) by mouth 2 (two) times daily. 10 capsule 0   fenofibrate micronized (LOFIBRA) 134 MG capsule TAKE 1 CAPSULE(134 MG) BY MOUTH DAILY 90 capsule 1   furosemide (LASIX) 20 MG tablet Take 1 tablet (20 mg total) by mouth daily as needed for fluid or edema. 30 tablet 11   ketoconazole (NIZORAL) 2 % cream Apply 1 application. topically 2 (two) times daily as needed for irritation (fungal infections). 30 g 5   metFORMIN (GLUCOPHAGE) 500 MG tablet TAKE 1 TABLET(500 MG) BY MOUTH TWICE DAILY 180 tablet 3   Multiple Vitamins-Minerals (MULTIVITAMIN/EXTRA VITAMIN D3 PO) Take 1 tablet by mouth daily.     mupirocin ointment (BACTROBAN) 2 % Apply inside both nostrils twice a day 30 g 5   Omega-3 Fatty Acids (FISH OIL PO) Take by mouth.     OVER THE COUNTER MEDICATION Take 1 tablet by mouth daily. Saffron     OVER THE COUNTER MEDICATION Take 320 mg by mouth daily. phytoceramides     OVER THE COUNTER MEDICATION Take 2 capsules by mouth daily. Memory and Brain     OVER THE COUNTER MEDICATION Take 1 capsule by mouth daily. Cruciferous extract  OVER THE COUNTER MEDICATION Take 1 capsule by mouth daily. fruit full antioxidant     polyethylene glycol  (MIRALAX / GLYCOLAX) 17 g packet Take 17 g by mouth daily as needed for mild constipation. 14 each 0   Turmeric 400 MG CAPS Take 400 mg by mouth daily. (Patient not taking: Reported on 01/26/2023)     UNABLE TO FIND Med Name: vegetable blend     No current facility-administered medications on file prior to visit.   Allergies  Allergen Reactions   Levofloxacin In D5w Other (See Comments)    Achilles tendon weakness   Mango Flavor Swelling    fruit   Bupropion Nausea And Vomiting   Family History  Problem Relation Age of Onset   Breast cancer Mother    High blood pressure Mother    Kidney disease Mother    Sleep apnea Mother    Obesity Mother    CAD Father        CABG   Skin cancer Father    Lung cancer Father    Diverticulitis Father    Colon polyps Father    High blood pressure Father    Stroke Father    Heart disease Father    Sleep apnea Father    Allergies Brother    Diabetes Brother    Breast cancer Maternal Grandmother    Heart attack Maternal Grandfather    Heart attack Paternal Grandfather    COPD Other    Cancer Other    Heart disease Other    Stroke Other    PE: BP 120/70 (BP Location: Left Arm, Patient Position: Sitting, Cuff Size: Normal)   Pulse 83   Ht 5' (1.524 m)   Wt 219 lb 3.2 oz (99.4 kg)   LMP 02/23/2012   SpO2 97%   BMI 42.81 kg/m   Wt Readings from Last 3 Encounters:  02/10/23 219 lb 3.2 oz (99.4 kg)  01/26/23 218 lb 3.2 oz (99 kg)  05/27/22 200 lb (90.7 kg)   Constitutional: overweight, in NAD Eyes:  EOMI, no exophthalmos ENT: no neck masses, no cervical lymphadenopathy Cardiovascular: RRR, No MRG, + mild swelling L lower leg, mostly periankle Respiratory: CTA B Musculoskeletal: no deformities Skin:no rashes Neurological: no tremor with outstretched hands  ASSESSMENT: 1. Thyrotoxycosis, likely mild Graves' disease  07/10/2014: Uptake and scan: 24 hr uptake by the thyroid gland is 28.5%. Normal 24 hr uptake is 10-30 %. On thyroid  imaging, the thyroid activity appears mildly heterogeneous in the right lobe with a possible cold nodule inferiorly. The left lobe activity appears homogeneous.  2. Multinodular goiter  07/15/2014: Thyroid U/S: Right thyroid lobe: 4.5 x 1.9 x 1.9 cm. Multiple nodules are noted throughout the right lobe of the thyroid. There is diffuse irregular margined hypoechoic nodule measuring 16 mm in the upper pole. Multiple smaller nodules are identified. Left thyroid lobe: 4.4 x 1.7 x 1.9 cm. Multiple hypoechoic nodules are noted within the left lobe of the thyroid. The largest of these measures 15 mm in greatest dimension lying within the lower pole Isthmus Thickness: 0.5 cm. No nodules visualized. Lymphadenopathy None visualized.  IMPRESSION: Multiple bilateral thyroid nodules. The dominant nodules bilateral meet criteria for percutaneous biopsy.   10/20/2017: Thyroid U/S: Location: Right; Inferior Maximum size: 1.5 cm; Other 2 dimensions: 1.1 cm x 1.3 cm Composition: solid/almost completely solid (2) Echogenicity: isoechoic (1)  IMPRESSION: Right inferior thyroid nodule (labeled 1) meets criteria for surveillance, as designated by the newly established ACR TI-RADS  criteria. Surveillance ultrasound study recommended to be performed annually up to 5 years. Given that the prior ultrasound was performed 2015, this may serve as the third annual.  10/24/2018: Thyroid U/S: Parenchymal Echotexture: Moderately heterogenous Isthmus: 0.7 cm Right lobe: 5.2 x 2.2 x 2.3 cm Left lobe: 5.5 x 2.5 x 2.0 cm  No discrete nodules are seen within the thyroid gland. Specifically, the 1.5 cm nodule previously seen in the right inferior gland is no longer discernible from the background thyroid parenchyma. It very likely represented a pseudo nodule.   IMPRESSION: 1. The previously identified right inferior thyroid nodule can not be identified on today's examination. It is either involuted over the past  year, or was representative of a pseudo nodule on the prior examination. No further follow-up required. 2. Similar appearance of enlarged and diffusely heterogeneous thyroid gland.  3. Prediabetes   4.  Vitamin D deficiency - per PCP now  PLAN:  1. Patient with history of hyperthyroidism, initially with few thyrotoxic symptoms: Heat intolerance and palpitations.  She does have a history of atrial fibrillation and she is on Cardizem, changed from metoprolol due to dizziness. -A thyroid uptake and scan showed mild Graves' disease versus mildly thyrotoxic nodules.  Of note, her TSI's were not elevated in the past.  We started methimazole in 05/2015 and she continues on 2.5 mg daily.  In the past, she was changing the methimazole dose (increased to 5 mg daily) whenever she had neck compression symptoms: Neck fullness and dysphagia.  We discussed about not changing the methimazole dose without checking labs.  However, we were able to stop methimazole in 01/2021.  TFTs were normal afterwards -Before last visit, she lost 18 pounds, but this was intentional, due to consistent exercise.  After her hip replacement surgery, however she gained 19 pounds back.  She does not have thyrotoxic symptoms.  She has numbness in the hands, possibly related to carpal tunnel, which can be a manifestation of hypothyroidism. -Will recheck her TFTs today to see if we need to restart methimazole or, if she developed hypothyroidism and may need to start levothyroxine. -I will have her come back in 1 year  2. Multinodular goiter -Reviewed the report of her latest thyroid ultrasound from 10/24/2018: Her thyroid nodules were not visible on the new ultrasound, consistent with previous pseudonodule (sites of inflammation), which resolved -She denies neck compression symptoms -No further follow-up is necessary for this  3. Prediabetes -She continues on metformin ER only -At last check, HbA1c was still normal, at 5.5%,  decreased from 6.1%: Lab Results  Component Value Date   HGBA1C 5.5 03/26/2022  -At last visit, she started to exercise consistently at the gym and she was feeling much better.  She also lost 18 pounds before last visit. -Since then however, she gained a net 11 pounds back -Will recheck her HbA1c today -I will see her back in a year   Component     Latest Ref Rng 02/10/2023  Hemoglobin A1C     4.6 - 6.5 % 6.9 (H)   TSH     0.35 - 5.50 uIU/mL 0.93   T4,Free(Direct)     0.60 - 1.60 ng/dL 1.61   Triiodothyronine,Free,Serum     2.3 - 4.2 pg/mL 3.2   Thyroid tests are normal, however, HbA1c is in the diabetic range. Will definitely advise her to work on the diet and exercise consistently.   She is due for an annual physical exam with PCP, I will advise  her to have this reevaluated at that time.  Carlus Pavlov, MD PhD [facility]

## 2023-02-10 NOTE — Patient Instructions (Addendum)
Please continue: - Metformin 500 mg 2x a day  Please stop at the lab.  Please come back for a follow-up appointment in 1 year.

## 2023-02-11 LAB — TSH: TSH: 0.93 u[IU]/mL (ref 0.35–5.50)

## 2023-02-11 LAB — HEMOGLOBIN A1C: Hgb A1c MFr Bld: 6.9 % — ABNORMAL HIGH (ref 4.6–6.5)

## 2023-02-11 LAB — T4, FREE: Free T4: 1.07 ng/dL (ref 0.60–1.60)

## 2023-02-11 LAB — T3, FREE: T3, Free: 3.2 pg/mL (ref 2.3–4.2)

## 2023-02-14 ENCOUNTER — Ambulatory Visit: Payer: BC Managed Care – PPO | Admitting: Internal Medicine

## 2023-02-18 ENCOUNTER — Other Ambulatory Visit: Payer: Self-pay | Admitting: Family Medicine

## 2023-04-06 ENCOUNTER — Encounter: Payer: Self-pay | Admitting: Obstetrics & Gynecology

## 2023-04-06 ENCOUNTER — Other Ambulatory Visit: Payer: Self-pay | Admitting: Family Medicine

## 2023-04-06 ENCOUNTER — Ambulatory Visit (INDEPENDENT_AMBULATORY_CARE_PROVIDER_SITE_OTHER): Payer: Medicare PPO | Admitting: Obstetrics & Gynecology

## 2023-04-06 VITALS — BP 116/78 | HR 68 | Ht 60.25 in | Wt 216.0 lb

## 2023-04-06 DIAGNOSIS — Z780 Asymptomatic menopausal state: Secondary | ICD-10-CM

## 2023-04-06 DIAGNOSIS — E66813 Obesity, class 3: Secondary | ICD-10-CM

## 2023-04-06 DIAGNOSIS — Z01419 Encounter for gynecological examination (general) (routine) without abnormal findings: Secondary | ICD-10-CM

## 2023-04-06 DIAGNOSIS — F17290 Nicotine dependence, other tobacco product, uncomplicated: Secondary | ICD-10-CM

## 2023-04-06 DIAGNOSIS — Z6841 Body Mass Index (BMI) 40.0 and over, adult: Secondary | ICD-10-CM

## 2023-04-06 NOTE — Progress Notes (Signed)
[facility] November 17, 1955 [id number]   History:    67 y.o.   G2P1A1L1 Married.  Son 42 yo.  S/P Lt SO.  Retired.   RP:  Established patient presenting for annual gyn exam    HPI:  Postmenopause.  No HRT.  No PMB.  No pelvic pain.  Currently abstinent.  Pap Neg 11/2020.  No h/o abnormal Pap.  Will repeat at 4 yrs. Breasts normal.  Mammo Neg 03/2022, will schedule now. H/O Knee replacement.  Obesity with BMI 41.84. Had a bowel perforation secondary to Diverticulitis with Bowel resection, had colostomy. Following many surgeries, developed a large Left Abdominal Hernia. Needs to loose weight so repair can be scheduled. Cigarette smoker <1/2 pack per day.  Health labs with Dr Clent Ridges.  Colono in 05/2022.  BD Normal in 09/2020.    Past medical history,surgical history, family history and social history were all reviewed and documented in the EPIC chart.  Gynecologic History Patient's last menstrual period was 02/23/2012.  Obstetric History OB History  Gravida Para Term Preterm AB Living  2 1 1   1 1   SAB IAB Ectopic Multiple Live Births      1   1    # Outcome Date GA Lbr Len/2nd Weight Sex Delivery Anes PTL Lv  2 Ectopic           1 Term              ROS: A ROS was performed and pertinent positives and negatives are included in the history. GENERAL: No fevers or chills. HEENT: No change in vision, no earache, sore throat or sinus congestion. NECK: No pain or stiffness. CARDIOVASCULAR: No chest pain or pressure. No palpitations. PULMONARY: No shortness of breath, cough or wheeze. GASTROINTESTINAL: No abdominal pain, nausea, vomiting or diarrhea, melena or bright red blood per rectum. GENITOURINARY: No urinary frequency, urgency, hesitancy or dysuria. MUSCULOSKELETAL: No joint or muscle pain, no back pain, no recent trauma. DERMATOLOGIC: No rash, no itching, no lesions. ENDOCRINE: No polyuria, polydipsia, no heat or cold intolerance. No recent change in weight. HEMATOLOGICAL: No anemia or  easy bruising or bleeding. NEUROLOGIC: No headache, seizures, numbness, tingling or weakness. PSYCHIATRIC: No depression, no loss of interest in normal activity or change in sleep pattern.     Exam:   BP 116/78   Pulse 68   Ht 5' 0.25" (1.53 m)   Wt 216 lb (98 kg)   LMP 02/23/2012 Comment: not sexually active  SpO2 98%   BMI 41.84 kg/m   Body mass index is 41.84 kg/m.  General appearance : Well developed well nourished female. No acute distress HEENT: Eyes: no retinal hemorrhage or exudates,  Neck supple, trachea midline, no carotid bruits, no thyroidmegaly Lungs: Clear to auscultation, no rhonchi or wheezes, or rib retractions  Heart: Regular rate and rhythm, no murmurs or gallops Breast:Examined in sitting and supine position were symmetrical in appearance, no palpable masses or tenderness,  no skin retraction, no nipple inversion, no nipple discharge, no skin discoloration, no axillary or supraclavicular lymphadenopathy Abdomen: no palpable masses or tenderness, no rebound or guarding Extremities: no edema or skin discoloration or tenderness  Pelvic: Vulva: Normal             Vagina: No gross lesions or discharge  Cervix: No gross lesions or discharge  Uterus  AV, normal size, shape and consistency, non-tender and mobile  Adnexa  Without masses or tenderness  Anus: Normal   Assessment/Plan:  67 y.o.  female for annual exam   1. Well female exam with routine gynecological exam Postmenopause.  No HRT.  No PMB.  No pelvic pain.  Currently abstinent.  Pap Neg 11/2020.  No h/o abnormal Pap.  Will repeat at 4 yrs. Breasts normal.  Mammo Neg 03/2022, will schedule now. H/O Knee replacement.  Obesity with BMI 41.84. Had a bowel perforation secondary to Diverticulitis with Bowel resection, had colostomy. Following many surgeries, developed a large Left Abdominal Hernia. Needs to loose weight so repair can be scheduled. Cigarette smoker <1/2 pack per day.  Health labs with Dr Clent Ridges.  Colono  in 05/2022.  BD Normal in 09/2020.  2. Postmenopausal Postmenopause.  No HRT.  No PMB.  No pelvic pain.  Currently abstinent.   3. Cigar smoker Recommend to quit.  4. Class 3 severe obesity due to excess calories with serious comorbidity and body mass index (BMI) of 40.0 to 44.9 in adult [facility]) Recommend considering Ozempic for weight loss prior to Surgical repair of abdominal hernia.  Other orders - MAGNESIUM PO; Take by mouth.   Genia Del MD, 8:44 AM

## 2023-04-08 ENCOUNTER — Other Ambulatory Visit: Payer: Self-pay | Admitting: Family Medicine

## 2023-04-08 DIAGNOSIS — Z1231 Encounter for screening mammogram for malignant neoplasm of breast: Secondary | ICD-10-CM

## 2023-04-27 ENCOUNTER — Ambulatory Visit
Admission: RE | Admit: 2023-04-27 | Discharge: 2023-04-27 | Disposition: A | Discharge status: Home / Self Care | Payer: Medicare PPO | Source: Ambulatory Visit

## 2023-04-27 DIAGNOSIS — Z1231 Encounter for screening mammogram for malignant neoplasm of breast: Secondary | ICD-10-CM

## 2023-05-02 DIAGNOSIS — L812 Freckles: Secondary | ICD-10-CM | POA: Diagnosis not present

## 2023-05-02 DIAGNOSIS — L723 Sebaceous cyst: Secondary | ICD-10-CM | POA: Diagnosis not present

## 2023-05-02 DIAGNOSIS — L570 Actinic keratosis: Secondary | ICD-10-CM | POA: Diagnosis not present

## 2023-05-02 DIAGNOSIS — D3617 Benign neoplasm of peripheral nerves and autonomic nervous system of trunk, unspecified: Secondary | ICD-10-CM | POA: Diagnosis not present

## 2023-05-02 DIAGNOSIS — D225 Melanocytic nevi of trunk: Secondary | ICD-10-CM | POA: Diagnosis not present

## 2023-05-02 DIAGNOSIS — L218 Other seborrheic dermatitis: Secondary | ICD-10-CM | POA: Diagnosis not present

## 2023-05-02 DIAGNOSIS — L821 Other seborrheic keratosis: Secondary | ICD-10-CM | POA: Diagnosis not present

## 2023-05-02 DIAGNOSIS — L718 Other rosacea: Secondary | ICD-10-CM | POA: Diagnosis not present

## 2023-05-02 IMAGING — MG MM DIGITAL SCREENING BILAT W/ TOMO AND CAD
8 series · 8 of 24 positions shown · non-contrast
Comparison: Previous exam(s).

ACR Breast Density Category a: The breast tissue is almost entirely
fatty.

CLINICAL DATA: Screening.

EXAM:
DIGITAL SCREENING BILATERAL MAMMOGRAM WITH TOMOSYNTHESIS AND CAD
TECHNIQUE: Bilateral screening digital craniocaudal and mediolateral oblique
mammograms were obtained. Bilateral screening digital breast
tomosynthesis was performed. The images were evaluated with
computer-aided detection.

[R CC synth-2D]
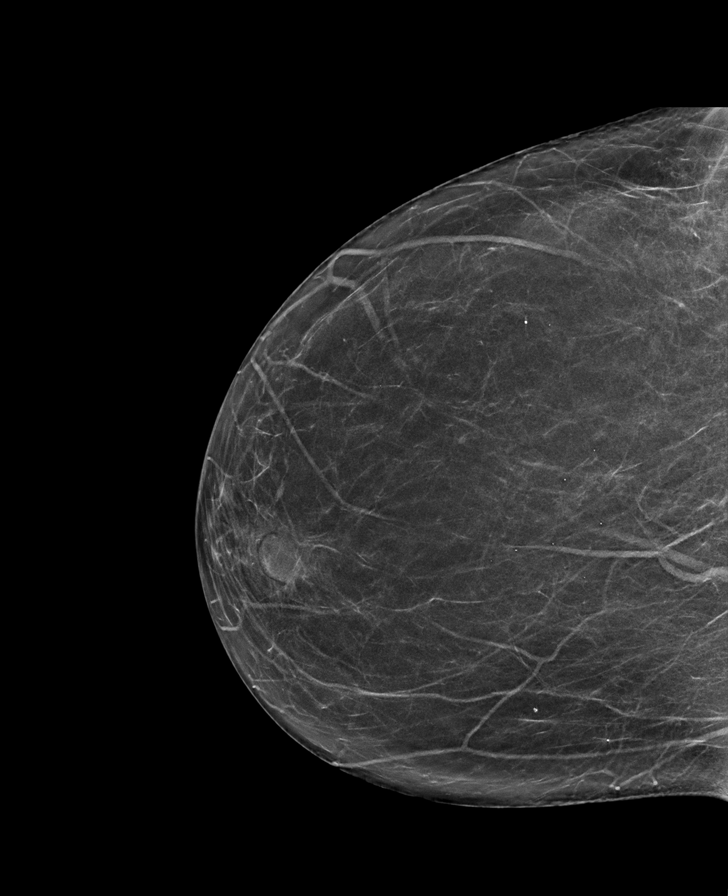

[L MLO synth-2D]
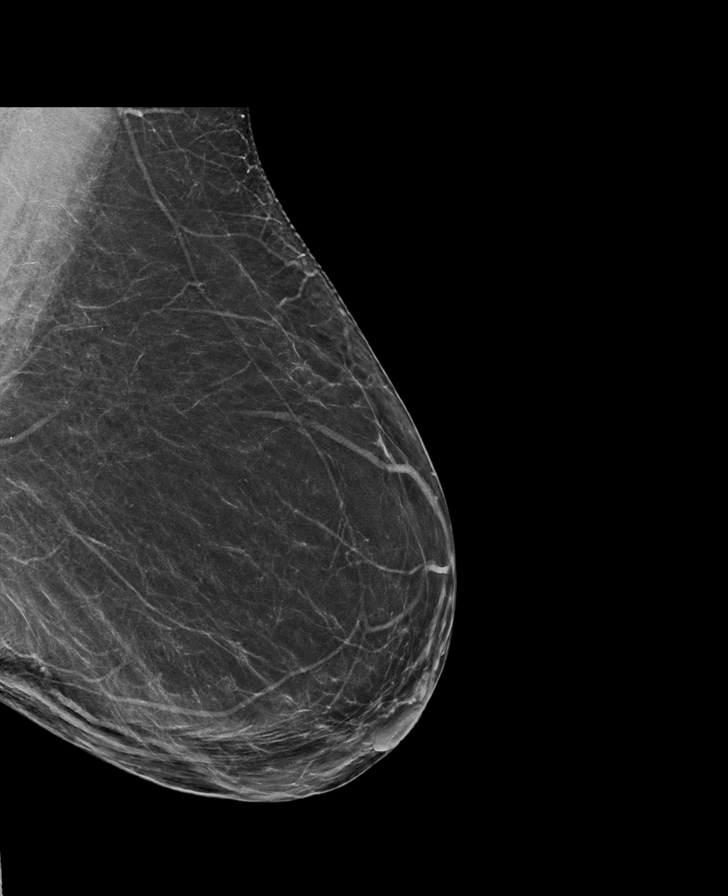

[L CC synth-2D]
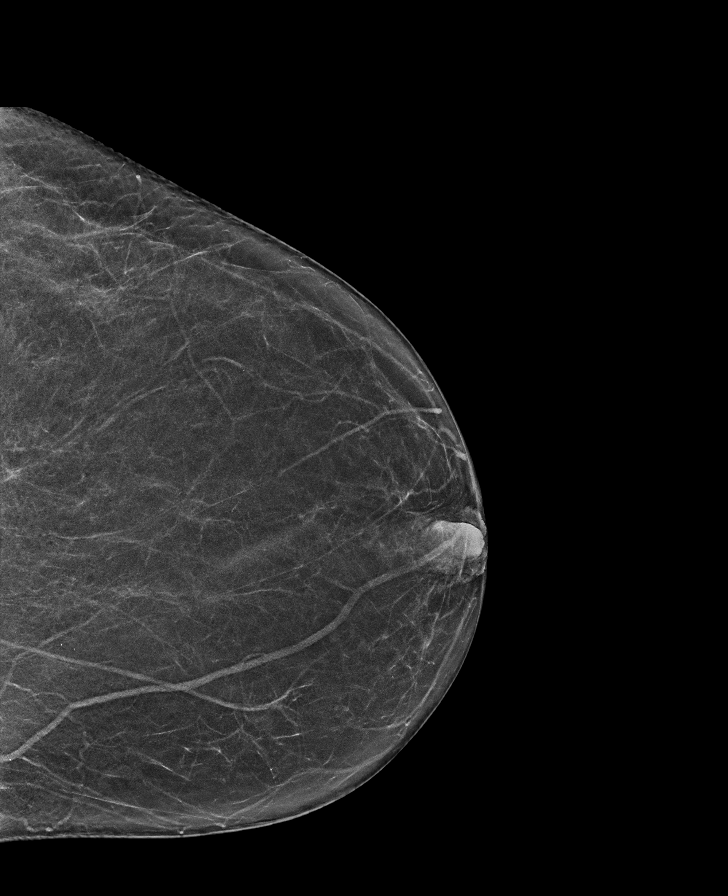

[R MLO synth-2D]
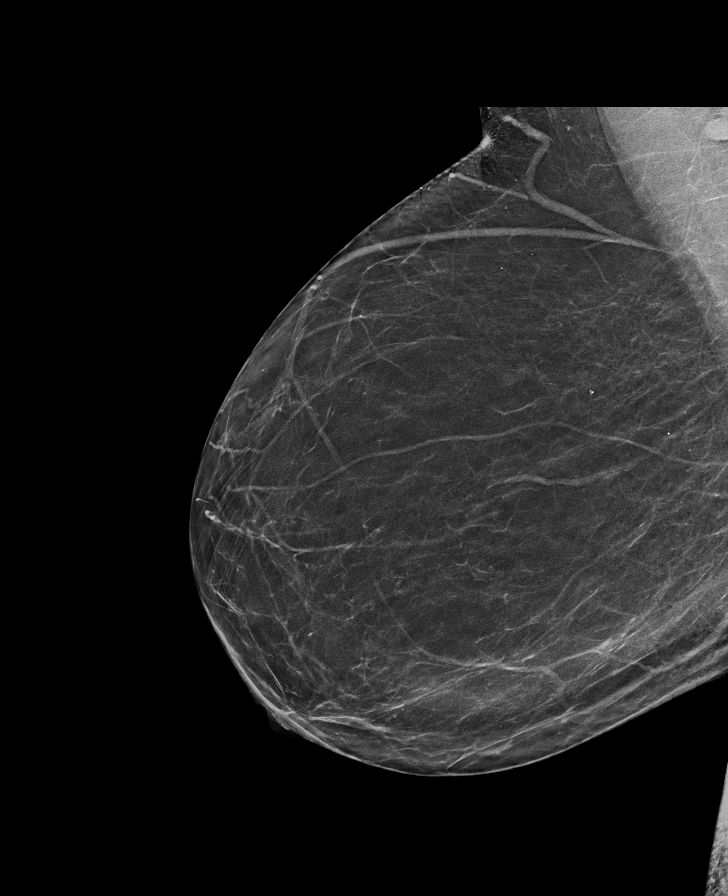

[L MLO tomo · tomo slice 35/68.0]
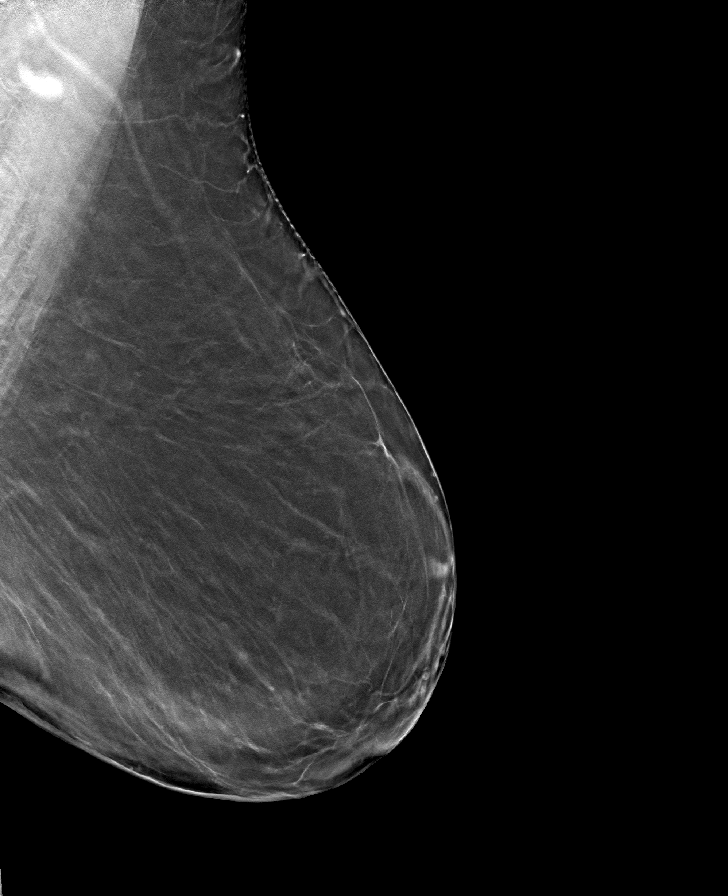

[L CC tomo · tomo slice 29/56.0]
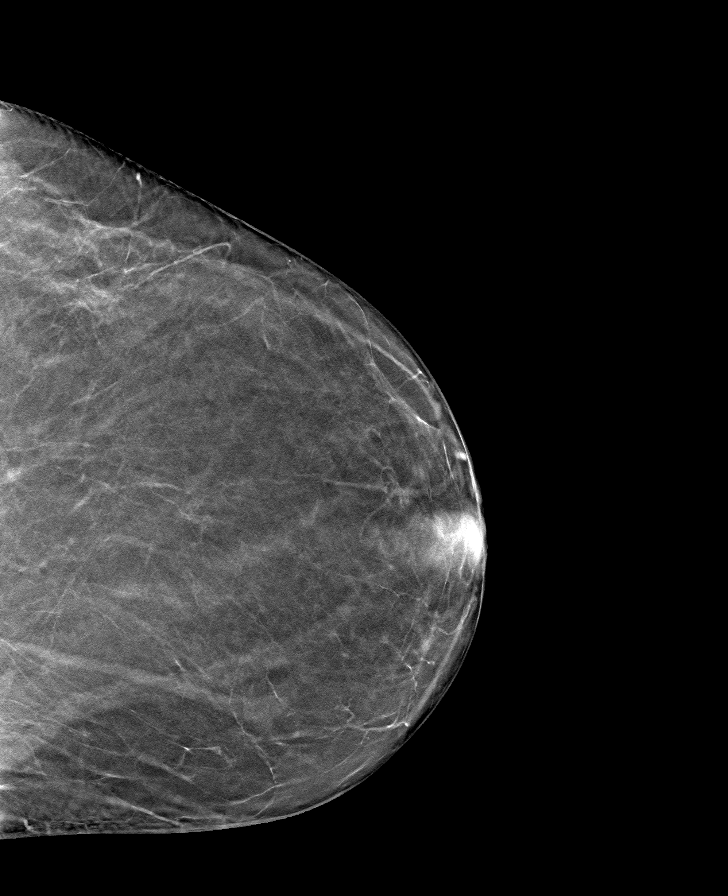

[R MLO tomo · tomo slice 33/66.0]
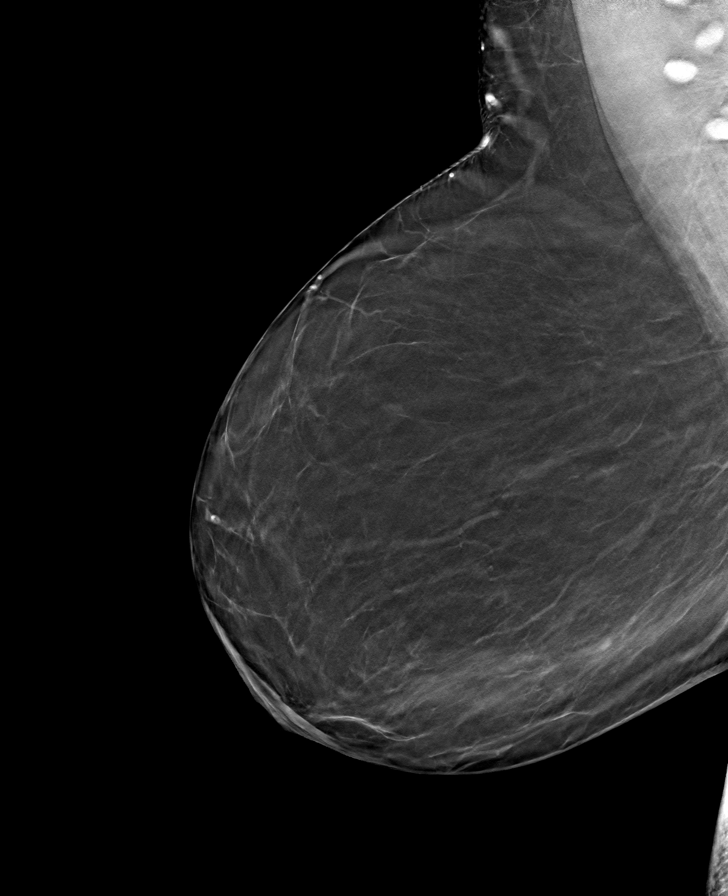

[R CC tomo · tomo slice 29/58.0]
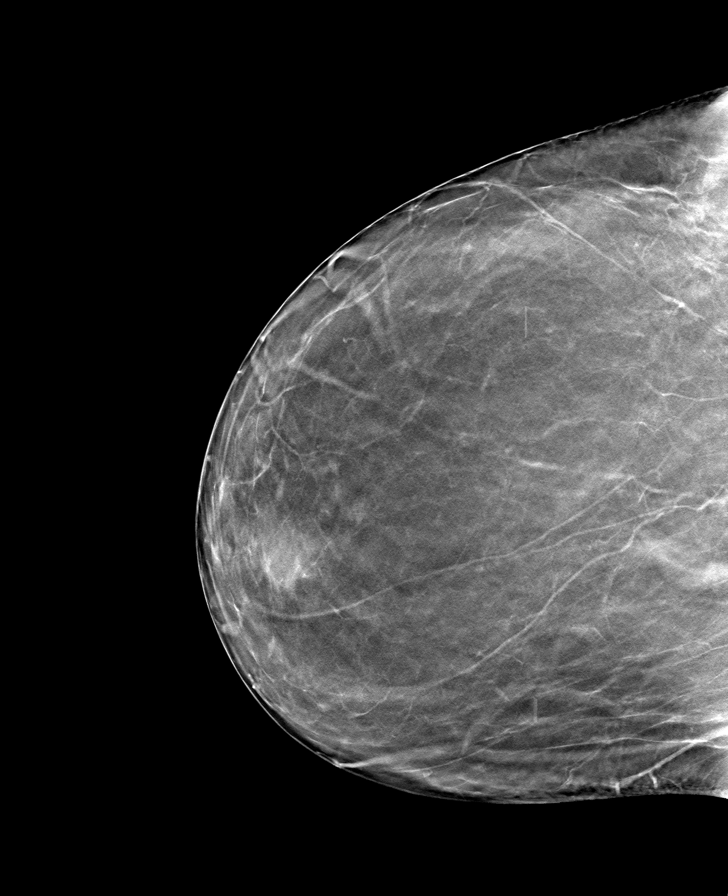

[8 of 24 positions shown; findings below may reference images not displayed]

FINDINGS: There are no findings suspicious for malignancy.
IMPRESSION: No mammographic evidence of malignancy. A result letter of this
screening mammogram will be mailed directly to the patient.

RECOMMENDATION:
Screening mammogram in one year. (Code:0E-3-N98)

BI-RADS CATEGORY  1: Negative.

## 2023-05-04 ENCOUNTER — Ambulatory Visit (INDEPENDENT_AMBULATORY_CARE_PROVIDER_SITE_OTHER): Payer: Medicare PPO | Admitting: Family Medicine

## 2023-05-04 ENCOUNTER — Encounter: Payer: Self-pay | Admitting: Family Medicine

## 2023-05-04 VITALS — BP 122/76 | HR 68 | Temp 98.3°F | Ht 60.25 in | Wt 217.0 lb

## 2023-05-04 DIAGNOSIS — E538 Deficiency of other specified B group vitamins: Secondary | ICD-10-CM | POA: Diagnosis not present

## 2023-05-04 DIAGNOSIS — Z23 Encounter for immunization: Secondary | ICD-10-CM

## 2023-05-04 DIAGNOSIS — R7303 Prediabetes: Secondary | ICD-10-CM

## 2023-05-04 DIAGNOSIS — I7300 Raynaud's syndrome without gangrene: Secondary | ICD-10-CM

## 2023-05-04 DIAGNOSIS — M7989 Other specified soft tissue disorders: Secondary | ICD-10-CM

## 2023-05-04 DIAGNOSIS — E785 Hyperlipidemia, unspecified: Secondary | ICD-10-CM | POA: Diagnosis not present

## 2023-05-04 DIAGNOSIS — K219 Gastro-esophageal reflux disease without esophagitis: Secondary | ICD-10-CM | POA: Diagnosis not present

## 2023-05-04 DIAGNOSIS — M159 Polyosteoarthritis, unspecified: Secondary | ICD-10-CM

## 2023-05-04 DIAGNOSIS — G629 Polyneuropathy, unspecified: Secondary | ICD-10-CM

## 2023-05-04 DIAGNOSIS — E042 Nontoxic multinodular goiter: Secondary | ICD-10-CM

## 2023-05-04 DIAGNOSIS — E559 Vitamin D deficiency, unspecified: Secondary | ICD-10-CM | POA: Diagnosis not present

## 2023-05-04 DIAGNOSIS — E0580 Other thyrotoxicosis without thyrotoxic crisis or storm: Secondary | ICD-10-CM

## 2023-05-04 DIAGNOSIS — N644 Mastodynia: Secondary | ICD-10-CM

## 2023-05-04 LAB — CBC WITH DIFFERENTIAL/PLATELET
Basophils Absolute: 0 10*3/uL (ref 0.0–0.1)
Basophils Relative: 0.6 % (ref 0.0–3.0)
Eosinophils Absolute: 0.1 10*3/uL (ref 0.0–0.7)
Eosinophils Relative: 1.8 % (ref 0.0–5.0)
HCT: 46.6 % — ABNORMAL HIGH (ref 36.0–46.0)
Hemoglobin: 15.5 g/dL — ABNORMAL HIGH (ref 12.0–15.0)
Lymphocytes Relative: 28.4 % (ref 12.0–46.0)
Lymphs Abs: 2.2 10*3/uL (ref 0.7–4.0)
MCHC: 33.2 g/dL (ref 30.0–36.0)
MCV: 94.5 fl (ref 78.0–100.0)
Monocytes Absolute: 0.6 10*3/uL (ref 0.1–1.0)
Monocytes Relative: 7.8 % (ref 3.0–12.0)
Neutro Abs: 4.8 10*3/uL (ref 1.4–7.7)
Neutrophils Relative %: 61.4 % (ref 43.0–77.0)
Platelets: 302 10*3/uL (ref 150.0–400.0)
RBC: 4.93 Mil/uL (ref 3.87–5.11)
RDW: 14.5 % (ref 11.5–15.5)
WBC: 7.8 10*3/uL (ref 4.0–10.5)

## 2023-05-04 LAB — LIPID PANEL
Cholesterol: 104 mg/dL (ref 0–200)
HDL: 25 mg/dL — ABNORMAL LOW (ref >39.00)
NonHDL: 78.97
Total CHOL/HDL Ratio: 4
Triglycerides: 214 mg/dL — ABNORMAL HIGH (ref 0.0–149.0)
VLDL: 42.8 mg/dL — ABNORMAL HIGH (ref 0.0–40.0)

## 2023-05-04 LAB — HEPATIC FUNCTION PANEL
ALT: 27 U/L (ref 0–35)
AST: 23 U/L (ref 0–37)
Albumin: 4.4 g/dL (ref 3.5–5.2)
Alkaline Phosphatase: 53 U/L (ref 39–117)
Bilirubin, Direct: 0.1 mg/dL (ref 0.0–0.3)
Total Bilirubin: 0.5 mg/dL (ref 0.2–1.2)
Total Protein: 7 g/dL (ref 6.0–8.3)

## 2023-05-04 LAB — LDL CHOLESTEROL, DIRECT: Direct LDL: 34 mg/dL

## 2023-05-04 LAB — BASIC METABOLIC PANEL
BUN: 23 mg/dL (ref 6–23)
CO2: 27 mEq/L (ref 19–32)
Calcium: 10.1 mg/dL (ref 8.4–10.5)
Chloride: 104 mEq/L (ref 96–112)
Creatinine, Ser: 0.94 mg/dL (ref 0.40–1.20)
GFR: 63.58 mL/min (ref >60.00)
Glucose, Bld: 144 mg/dL — ABNORMAL HIGH (ref 70–99)
Potassium: 4 mEq/L (ref 3.5–5.1)
Sodium: 140 mEq/L (ref 135–145)

## 2023-05-04 LAB — HEMOGLOBIN A1C: Hgb A1c MFr Bld: 6.7 % — ABNORMAL HIGH (ref 4.6–6.5)

## 2023-05-04 LAB — VITAMIN B12: Vitamin B-12: 504 pg/mL (ref 211–911)

## 2023-05-04 LAB — VITAMIN D 25 HYDROXY (VIT D DEFICIENCY, FRACTURES): VITD: 52.22 ng/mL (ref 30.00–100.00)

## 2023-05-04 NOTE — Progress Notes (Signed)
Subjective:    Patient ID: Alison Best, female    DOB: 1956-06-06, 67 y.o.   MRN: [id number]  HPI Here to follow up on issues. She is due for a mammogram, and she had a normal breast exam with Dr. Seymour Bars, her GYN, last month. However mentions some pain in the left armpit that comes and goes. She has never felt a lump. Also she is struggling with her weight she tends to stress eat, and she tends to snack on carbs. She saw Dr. Elvera Lennox in April, and all her thyroid levels were normal. Her A1c had gone up to 6.9%, and Dr. Elvera Lennox wanted is to repeat an A1c today. Her BP has been stable. She describes some swelling that comes and goes in the left lower leg that started about 6 weeks ago. She has mild left calf pain at times. She also describes numbness and tingling in both hands and both feet that began a few months ago.    Review of Systems  Constitutional: Negative.   HENT: Negative.    Eyes: Negative.   Respiratory: Negative.    Cardiovascular:  Positive for leg swelling. Negative for chest pain and palpitations.  Gastrointestinal: Negative.   Genitourinary:  Negative for decreased urine volume, difficulty urinating, dyspareunia, dysuria, enuresis, flank pain, frequency, hematuria, pelvic pain and urgency.  Musculoskeletal:  Positive for arthralgias.  Skin: Negative.   Neurological:  Positive for numbness. Negative for headaches.  Psychiatric/Behavioral: Negative.         Objective:   Physical Exam Constitutional:      General: She is not in acute distress.    Appearance: She is well-developed. She is obese. She is not ill-appearing.  HENT:     Head: Normocephalic and atraumatic.     Right Ear: External ear normal.     Left Ear: External ear normal.     Nose: Nose normal.     Mouth/Throat:     Pharynx: No oropharyngeal exudate.  Eyes:     General: No scleral icterus.    Conjunctiva/sclera: Conjunctivae normal.     Pupils: Pupils are equal, round, and reactive to  light.  Neck:     Thyroid: No thyromegaly.     Vascular: No JVD.  Cardiovascular:     Rate and Rhythm: Normal rate and regular rhythm.     Pulses: Normal pulses.     Heart sounds: Normal heart sounds. No murmur heard.    No friction rub. No gallop.  Pulmonary:     Effort: Pulmonary effort is normal. No respiratory distress.     Breath sounds: Normal breath sounds. No wheezing or rales.     Comments: The left axilla is not tender and has no masses  Chest:     Chest wall: No tenderness.  Abdominal:     General: Bowel sounds are normal. There is no distension.     Palpations: Abdomen is soft. There is no mass.     Tenderness: There is no abdominal tenderness. There is no guarding or rebound.  Musculoskeletal:        General: Normal range of motion.     Cervical back: Normal range of motion and neck supple.     Comments: There is trace edema in the left lower leg. She is tender in the left calf. No cords are felt   Lymphadenopathy:     Cervical: No cervical adenopathy.  Skin:    General: Skin is warm and dry.     Findings:  No erythema or rash.  Neurological:     General: No focal deficit present.     Mental Status: She is alert and oriented to person, place, and time.     Cranial Nerves: No cranial nerve deficit.     Motor: No abnormal muscle tone.     Coordination: Coordination normal.     Deep Tendon Reflexes: Reflexes are normal and symmetric. Reflexes normal.  Psychiatric:        Mood and Affect: Mood normal.        Behavior: Behavior normal.        Thought Content: Thought content normal.        Judgment: Judgment normal.           Assessment & Plan:  Her OA and GERD are stable. Her thyroid disorder is stable. She has borderline type 2 diabetes, so we wil check another A1c today. Get fasting labs for lipids, etc. Check levels for vitamin D. She has a neuropathy so we will check a B12 level. For the last axilla pain we will order a diagnostic bilateral mammogram with a  left breast US. We will order a left leg venous doppler to rule out a DVT.  Gershon Crane, MD

## 2023-05-04 NOTE — Addendum Note (Signed)
Addended by: Carola Rhine on: 05/04/2023 05:07 PM   Modules accepted: Orders

## 2023-05-05 ENCOUNTER — Encounter: Payer: Self-pay | Admitting: Family Medicine

## 2023-05-06 ENCOUNTER — Ambulatory Visit (HOSPITAL_COMMUNITY)
Admission: RE | Admit: 2023-05-06 | Discharge: 2023-05-06 | Disposition: A | Discharge status: Home / Self Care | Payer: Medicare PPO | Source: Ambulatory Visit | Attending: Cardiovascular Disease | Admitting: Cardiovascular Disease

## 2023-05-06 DIAGNOSIS — M7989 Other specified soft tissue disorders: Secondary | ICD-10-CM | POA: Diagnosis not present

## 2023-05-06 MED ORDER — EPINEPHRINE 0.3 MG/0.3ML IJ SOAJ: 0.3000 mg | INTRAMUSCULAR | 2 refills | Status: AC | PRN

## 2023-05-06 MED ORDER — ACETAZOLAMIDE ER 500 MG PO CP12: 500.0000 mg | ORAL_CAPSULE | Freq: Two times a day (BID) | ORAL | 2 refills | Status: AC

## 2023-05-06 MED ORDER — CELECOXIB 200 MG PO CAPS: 200.0000 mg | ORAL_CAPSULE | Freq: Two times a day (BID) | ORAL | 0 refills | Status: DC

## 2023-05-06 NOTE — Telephone Encounter (Signed)
Pt is calling back and want you to give her a call back she stated she have some questions .

## 2023-05-06 NOTE — Telephone Encounter (Signed)
Called pt to advised that all requested medications has been sent to pharmacy.

## 2023-05-06 NOTE — Telephone Encounter (Signed)
What I had asked her to do was to contact her insurance company and to ask them if they will cover any of the GLP-1 receptor agonists for weight loss (like semaglutide) and we will go from there

## 2023-05-06 NOTE — Telephone Encounter (Signed)
Message awaits Dr Clent Ridges response

## 2023-05-06 NOTE — Addendum Note (Signed)
Addended by: Waymon Amato R on: 05/06/2023 11:31 AM   Modules accepted: Orders

## 2023-05-06 NOTE — Telephone Encounter (Signed)
Spoke to pt and she stated that Dr.Fry stated he would call her something in after labs for weight loss. Also, pt requesting medication below and stated that she asked if Dr.Fry could take over the Celebrex and needed the other medication for travel.

## 2023-05-16 ENCOUNTER — Other Ambulatory Visit: Payer: Self-pay | Admitting: Family Medicine

## 2023-05-16 ENCOUNTER — Ambulatory Visit
Admission: RE | Admit: 2023-05-16 | Discharge: 2023-05-16 | Disposition: A | Discharge status: Home / Self Care | Payer: Medicare PPO | Source: Ambulatory Visit | Attending: Family Medicine | Admitting: Family Medicine

## 2023-05-16 DIAGNOSIS — I7300 Raynaud's syndrome without gangrene: Secondary | ICD-10-CM

## 2023-05-16 DIAGNOSIS — M159 Polyosteoarthritis, unspecified: Secondary | ICD-10-CM

## 2023-05-16 DIAGNOSIS — N644 Mastodynia: Secondary | ICD-10-CM

## 2023-05-16 DIAGNOSIS — E0580 Other thyrotoxicosis without thyrotoxic crisis or storm: Secondary | ICD-10-CM

## 2023-05-16 DIAGNOSIS — E538 Deficiency of other specified B group vitamins: Secondary | ICD-10-CM

## 2023-05-16 DIAGNOSIS — E042 Nontoxic multinodular goiter: Secondary | ICD-10-CM

## 2023-05-16 DIAGNOSIS — R7303 Prediabetes: Secondary | ICD-10-CM

## 2023-05-16 DIAGNOSIS — G629 Polyneuropathy, unspecified: Secondary | ICD-10-CM

## 2023-05-16 DIAGNOSIS — Z23 Encounter for immunization: Secondary | ICD-10-CM

## 2023-05-16 DIAGNOSIS — Z803 Family history of malignant neoplasm of breast: Secondary | ICD-10-CM | POA: Diagnosis not present

## 2023-05-16 DIAGNOSIS — E559 Vitamin D deficiency, unspecified: Secondary | ICD-10-CM

## 2023-05-16 DIAGNOSIS — M79622 Pain in left upper arm: Secondary | ICD-10-CM | POA: Diagnosis not present

## 2023-05-16 DIAGNOSIS — M7989 Other specified soft tissue disorders: Secondary | ICD-10-CM

## 2023-05-16 DIAGNOSIS — K219 Gastro-esophageal reflux disease without esophagitis: Secondary | ICD-10-CM

## 2023-05-16 DIAGNOSIS — E785 Hyperlipidemia, unspecified: Secondary | ICD-10-CM

## 2023-05-23 MED ORDER — ZEPBOUND 2.5 MG/0.5ML ~~LOC~~ SOAJ: 2.5000 mg | SUBCUTANEOUS | 5 refills | Status: DC

## 2023-05-23 NOTE — Addendum Note (Signed)
Addended by: Gershon Crane A on: 05/23/2023 07:43 AM   Modules accepted: Orders

## 2023-05-23 NOTE — Telephone Encounter (Signed)
I sent in for the starting dose of Zepbound

## 2023-05-25 NOTE — Telephone Encounter (Signed)
She should continue taking Metformin for the time being

## 2023-06-01 ENCOUNTER — Other Ambulatory Visit: Payer: Self-pay | Admitting: Family Medicine

## 2023-06-01 DIAGNOSIS — E119 Type 2 diabetes mellitus without complications: Secondary | ICD-10-CM | POA: Diagnosis not present

## 2023-06-01 DIAGNOSIS — H2513 Age-related nuclear cataract, bilateral: Secondary | ICD-10-CM | POA: Diagnosis not present

## 2023-06-01 DIAGNOSIS — H52203 Unspecified astigmatism, bilateral: Secondary | ICD-10-CM | POA: Diagnosis not present

## 2023-06-01 LAB — HM DIABETES EYE EXAM

## 2023-06-02 ENCOUNTER — Encounter: Payer: Self-pay | Admitting: Internal Medicine

## 2023-06-15 ENCOUNTER — Encounter: Payer: Self-pay | Admitting: Adult Health

## 2023-06-15 ENCOUNTER — Telehealth (INDEPENDENT_AMBULATORY_CARE_PROVIDER_SITE_OTHER): Payer: Medicare PPO | Admitting: Adult Health

## 2023-06-15 ENCOUNTER — Telehealth: Payer: Self-pay | Admitting: Family Medicine

## 2023-06-15 VITALS — Temp 96.8°F | Ht 60.25 in | Wt 217.0 lb

## 2023-06-15 DIAGNOSIS — U071 COVID-19: Secondary | ICD-10-CM

## 2023-06-15 MED ORDER — NIRMATRELVIR/RITONAVIR (PAXLOVID)TABLET
3.0000 | ORAL_TABLET | Freq: Two times a day (BID) | ORAL | 0 refills | Status: AC
Start: 2023-06-15 — End: 2023-06-20

## 2023-06-15 NOTE — Progress Notes (Signed)
Virtual Visit via Video Note  I connected with Alison Best on 06/15/23 at 11:15 AM EDT by a video enabled telemedicine application and verified that I am speaking with the correct person using two identifiers.  Location patient: home Location provider:work or home office Persons participating in the virtual visit: patient, provider  I discussed the limitations of evaluation and management by telemedicine and the availability of in person appointments. The patient expressed understanding and agreed to proceed.   HPI: 67 year old female who is being evaluated today for an acute issue.  She tested positive for COVID-19 this morning.  Her symptoms started yesterday with a mild productive cough.  Associated symptoms include nasal drainage, body aches, feeling feverish, GI discomfort, headache, and ear fullness.  She denies chest pain or shortness of breath.   ROS: See pertinent positives and negatives per HPI.  Past Medical History:  Diagnosis Date   Anal fissure    Arthritis    Bursitis    Constipation    COPD (chronic obstructive pulmonary disease) (HCC)    Diverticulitis of colon 02/2015   Ectopic pregnancy    Emphysema of lung (HCC)    Frozen shoulder    Heart murmur    Hernia, abdominal    Hyperlipidemia    Hyperthyroidism    Infertility, female    Irregular heartbeat    Joint pain    Obesity    Osteoarthritis    Pneumonia    Pre-diabetes    Sleep apnea    Swallowing difficulty    Thyroid disease    hyperthyroid    Past Surgical History:  Procedure Laterality Date   APPENDECTOMY     CESAREAN SECTION     COLONOSCOPY  05/27/2022   per Dr. Lavon Paganini, no polyps, repeat 10 yrs   COLOSTOMY N/A 02/20/2015   Procedure: COLOSTOMY;  Surgeon: Avel Peace, MD;  Location: [facility];  Service: General;  Laterality: N/A;   COLOSTOMY REVISION  02/20/2015   Procedure: Sigmoid Colectomy;  Surgeon: Avel Peace, MD;  Location: [facility];  Service: General;;   CYSTOSCOPY N/A  02/20/2015   Procedure: Bluford Kaufmann;  Surgeon: Ihor Gully, MD;  Location: [facility];  Service: Urology;  Laterality: N/A;   DILATION AND CURETTAGE OF UTERUS     x3   ECTOPIC PREGNANCY SURGERY     ILEO LOOP COLOSTOMY CLOSURE N/A 09/26/2015   Procedure: LAPAROSCOPIC LYSIS OF ADHESIONS, COLOSTOMY CLOSURE;  Surgeon: Avel Peace, MD;  Location: [facility];  Service: General;  Laterality: N/A;   LAPAROTOMY N/A 02/20/2015   Procedure: Emergency EXPLORATORY and drainiage of interabdominal abcesses;  Surgeon: Avel Peace, MD;  Location: [facility];  Service: General;  Laterality: N/A;   SALPINGOOPHORECTOMY Left 02/20/2015   Procedure: SALPINGO OOPHORECTOMY;  Surgeon: Avel Peace, MD;  Location: [facility];  Service: General;  Laterality: Left;   TONSILLECTOMY  1980   TOTAL HIP ARTHROPLASTY Left 02/23/2022   Procedure: TOTAL HIP ARTHROPLASTY ANTERIOR APPROACH;  Surgeon: Durene Romans, MD;  Location: [facility];  Service: Orthopedics;  Laterality: Left;    Family History  Problem Relation Age of Onset   Breast cancer Mother    High blood pressure Mother    Kidney disease Mother    Sleep apnea Mother    Obesity Mother    CAD Father        CABG   Skin cancer Father    Lung cancer Father    Diverticulitis Father    Colon polyps Father    High blood pressure Father  Stroke Father    Heart disease Father    Sleep apnea Father    Allergies Brother    Diabetes Brother    Breast cancer Maternal Grandmother    Heart attack Maternal Grandfather    Heart attack Paternal Grandfather    COPD Other    Cancer Other    Heart disease Other    Stroke Other        Current Outpatient Medications:    acetaZOLAMIDE ER (DIAMOX) 500 MG capsule, Take 1 capsule (500 mg total) by mouth 2 (two) times daily. When travelling at high altitudes, Disp: 60 capsule, Rfl: 2   atorvastatin (LIPITOR) 20 MG tablet, TAKE 1 TABLET(20 MG) BY MOUTH DAILY, Disp: 90 tablet, Rfl: 3   betamethasone dipropionate 0.05 % cream,  Apply 1 application. topically 2 (two) times daily as needed (eczema)., Disp: 45 g, Rfl: 5   celecoxib (CELEBREX) 200 MG capsule, TAKE 1 CAPSULE(200 MG) BY MOUTH TWICE DAILY, Disp: 60 capsule, Rfl: 0   Coenzyme Q10 (CO Q 10 PO), Take 200 mg by mouth daily., Disp: , Rfl:    COLLAGEN PO, Take 15 mLs by mouth daily. Liquid, Disp: , Rfl:    diltiazem (CARDIZEM CD) 120 MG 24 hr capsule, TAKE 1 CAPSULE(120 MG) BY MOUTH DAILY, Disp: 90 capsule, Rfl: 3   EPINEPHrine 0.3 mg/0.3 mL IJ SOAJ injection, Inject 0.3 mg into the muscle as needed for anaphylaxis., Disp: 1 each, Rfl: 2   fenofibrate micronized (LOFIBRA) 134 MG capsule, TAKE 1 CAPSULE(134 MG) BY MOUTH DAILY, Disp: 90 capsule, Rfl: 1   furosemide (LASIX) 20 MG tablet, TAKE 1 TABLET(20 MG) BY MOUTH DAILY AS NEEDED FOR FLUID RETENTION OR SWELLING, Disp: 30 tablet, Rfl: 2   ketoconazole (NIZORAL) 2 % cream, Apply 1 application. topically 2 (two) times daily as needed for irritation (fungal infections)., Disp: 30 g, Rfl: 5   MAGNESIUM PO, Take by mouth., Disp: , Rfl:    metFORMIN (GLUCOPHAGE) 500 MG tablet, TAKE 1 TABLET(500 MG) BY MOUTH TWICE DAILY, Disp: 180 tablet, Rfl: 3   Multiple Vitamins-Minerals (MULTIVITAMIN/EXTRA VITAMIN D3 PO), Take 1 tablet by mouth daily., Disp: , Rfl:    mupirocin ointment (BACTROBAN) 2 %, Apply inside both nostrils twice a day, Disp: 30 g, Rfl: 5   nirmatrelvir/ritonavir (PAXLOVID) 20 x 150 MG & 10 x 100MG  TABS, Take 3 tablets by mouth 2 (two) times daily for 5 days. (Take nirmatrelvir 150 mg two tablets twice daily for 5 days and ritonavir 100 mg one tablet twice daily for 5 days) Patient GFR is 63, Disp: 30 tablet, Rfl: 0   OVER THE COUNTER MEDICATION, Take 1 tablet by mouth daily. Saffron, Disp: , Rfl:    OVER THE COUNTER MEDICATION, Take 2 capsules by mouth daily. Memory and Brain, Disp: , Rfl:    OVER THE COUNTER MEDICATION, Take 1 capsule by mouth daily. Cruciferous extract, Disp: , Rfl:    OVER THE COUNTER MEDICATION,  Take 1 capsule by mouth daily. fruit full antioxidant, Disp: , Rfl:    polyethylene glycol (MIRALAX / GLYCOLAX) 17 g packet, Take 17 g by mouth daily as needed for mild constipation., Disp: 14 each, Rfl: 0   tirzepatide (ZEPBOUND) 2.5 MG/0.5ML Pen, Inject 2.5 mg into the skin once a week., Disp: 2 mL, Rfl: 5   UNABLE TO FIND, Med Name: vegetable blend, Disp: , Rfl:   EXAM:  VITALS per patient if applicable:  GENERAL: alert, oriented, appears well and in no acute distress  HEENT:  atraumatic, conjunttiva clear, no obvious abnormalities on inspection of external nose and ears  NECK: normal movements of the head and neck  LUNGS: on inspection no signs of respiratory distress, breathing rate appears normal, no obvious gross SOB, gasping or wheezing  CV: no obvious cyanosis  MS: moves all visible extremities without noticeable abnormality  PSYCH/NEURO: pleasant and cooperative, no obvious depression or anxiety, speech and thought processing grossly intact  ASSESSMENT AND PLAN:  Discussed the following assessment and plan:  1. COVID-19 virus infection -Will treat due to symptoms and medical history.  We did review quarantine instructions.  Advised hydration and rest.  Follow-up if symptoms not resolving in the next 3 to 4 days or sooner if symptoms worsen - nirmatrelvir/ritonavir (PAXLOVID) 20 x 150 MG & 10 x 100MG  TABS; Take 3 tablets by mouth 2 (two) times daily for 5 days. (Take nirmatrelvir 150 mg two tablets twice daily for 5 days and ritonavir 100 mg one tablet twice daily for 5 days) Patient GFR is 63  Dispense: 30 tablet; Refill: 0      I discussed the assessment and treatment plan with the patient. The patient was provided an opportunity to ask questions and all were answered. The patient agreed with the plan and demonstrated an understanding of the instructions.   The patient was advised to call back or seek an in-person evaluation if the symptoms worsen or if the condition  fails to improve as anticipated.   Shirline Frees, NP

## 2023-06-15 NOTE — Telephone Encounter (Signed)
ERROR PLEASE DISREGARD

## 2023-06-23 ENCOUNTER — Encounter: Payer: Self-pay | Admitting: Family Medicine

## 2023-06-23 NOTE — Telephone Encounter (Signed)
I recommend she wait 2 months after a Covid infection before she gets a vaccine. That gives her immune system time to recover so the vaccine will be more effective

## 2023-06-29 NOTE — Telephone Encounter (Signed)
I understand. We do not need to do anything now. Next year I will order the breast MRI, and we will do what is needed to get it authorized

## 2023-08-09 ENCOUNTER — Encounter: Payer: Self-pay | Admitting: Family Medicine

## 2023-08-11 MED ORDER — TIRZEPATIDE-WEIGHT MANAGEMENT 5 MG/0.5ML ~~LOC~~ SOLN: 5.0000 mg | SUBCUTANEOUS | 11 refills | Status: DC

## 2023-08-11 NOTE — Telephone Encounter (Signed)
We will increase the dose to 5 mg weekly. I sent in a new RX for this

## 2023-08-19 ENCOUNTER — Other Ambulatory Visit: Payer: Self-pay | Admitting: Medical Genetics

## 2023-08-19 DIAGNOSIS — Z006 Encounter for examination for normal comparison and control in clinical research program: Secondary | ICD-10-CM

## 2023-08-29 ENCOUNTER — Ambulatory Visit: Payer: Medicare PPO

## 2023-09-03 DIAGNOSIS — H43813 Vitreous degeneration, bilateral: Secondary | ICD-10-CM | POA: Diagnosis not present

## 2023-09-14 ENCOUNTER — Other Ambulatory Visit (HOSPITAL_COMMUNITY): Payer: Medicare PPO

## 2023-09-19 ENCOUNTER — Encounter: Payer: Self-pay | Admitting: Family Medicine

## 2023-09-19 ENCOUNTER — Ambulatory Visit: Payer: Medicare PPO

## 2023-09-19 VITALS — Ht 60.25 in | Wt 199.0 lb

## 2023-09-19 DIAGNOSIS — Z0000 Encounter for general adult medical examination without abnormal findings: Secondary | ICD-10-CM

## 2023-09-19 NOTE — Progress Notes (Signed)
Subjective:   Alison Best is a 67 y.o. female who presents for Medicare Annual (Subsequent) preventive examination.  Visit Complete: Virtual I connected with  Alison Best on 09/19/23 by a audio enabled telemedicine application and verified that I am speaking with the correct person using two identifiers.  Patient Location: Home  Provider Location: Home Office  I discussed the limitations of evaluation and management by telemedicine. The patient expressed understanding and agreed to proceed.  Vital Signs: Because this visit was a virtual/telehealth visit, some criteria may be missing or patient reported. Any vitals not documented were not able to be obtained and vitals that have been documented are patient reported.  Patient Medicare AWV questionnaire was completed by the patient on 09/15/23; I have confirmed that all information answered by patient is correct and no changes since this date.  Cardiac Risk Factors include: advanced age (>70men, >41 women)     Objective:    Today's Vitals   09/19/23 1539  Weight: 199 lb (90.3 kg)  Height: 5' 0.25" (1.53 m)   Body mass index is 38.54 kg/m.     09/19/2023    3:47 PM 07/06/2022    8:42 PM 04/20/2022    9:36 AM 02/23/2022    4:01 PM 02/11/2022   11:20 AM 01/28/2021   12:20 PM 07/26/2017    8:17 AM  Advanced Directives  Does Patient Have a Medical Advance Directive? Yes Yes Yes Yes Yes Yes Yes  Type of Estate agent of [place];Living will Healthcare Power of [place];Living will Healthcare Power of [place];Living will Healthcare Power of [place];Living will Healthcare Power of [place];Living will Healthcare Power of [place];Living will Healthcare Power of [place];Living will  Does patient want to make changes to medical advance directive?   No - Patient declined No - Patient declined   No - Patient declined  Copy of Healthcare Power of Attorney in Chart? No - copy requested   No - copy  requested       Current Medications (verified) Outpatient Encounter Medications as of 09/19/2023  Medication Sig   acetaZOLAMIDE ER (DIAMOX) 500 MG capsule Take 1 capsule (500 mg total) by mouth 2 (two) times daily. When travelling at high altitudes   atorvastatin (LIPITOR) 20 MG tablet TAKE 1 TABLET(20 MG) BY MOUTH DAILY   betamethasone dipropionate 0.05 % cream Apply 1 application. topically 2 (two) times daily as needed (eczema).   celecoxib (CELEBREX) 200 MG capsule TAKE 1 CAPSULE(200 MG) BY MOUTH TWICE DAILY   Coenzyme Q10 (CO Q 10 PO) Take 200 mg by mouth daily.   COLLAGEN PO Take 15 mLs by mouth daily. Liquid   diltiazem (CARDIZEM CD) 120 MG 24 hr capsule TAKE 1 CAPSULE(120 MG) BY MOUTH DAILY   EPINEPHrine 0.3 mg/0.3 mL IJ SOAJ injection Inject 0.3 mg into the muscle as needed for anaphylaxis.   fenofibrate micronized (LOFIBRA) 134 MG capsule TAKE 1 CAPSULE(134 MG) BY MOUTH DAILY   furosemide (LASIX) 20 MG tablet TAKE 1 TABLET(20 MG) BY MOUTH DAILY AS NEEDED FOR FLUID RETENTION OR SWELLING   ketoconazole (NIZORAL) 2 % cream Apply 1 application. topically 2 (two) times daily as needed for irritation (fungal infections).   MAGNESIUM PO Take by mouth.   metFORMIN (GLUCOPHAGE) 500 MG tablet TAKE 1 TABLET(500 MG) BY MOUTH TWICE DAILY   Multiple Vitamins-Minerals (MULTIVITAMIN/EXTRA VITAMIN D3 PO) Take 1 tablet by mouth daily.   mupirocin ointment (BACTROBAN) 2 % Apply inside both nostrils twice a day   OVER  THE COUNTER MEDICATION Take 1 tablet by mouth daily. [place]   OVER THE COUNTER MEDICATION Take 2 capsules by mouth daily. Memory and Brain   OVER THE COUNTER MEDICATION Take 1 capsule by mouth daily. Cruciferous extract   OVER THE COUNTER MEDICATION Take 1 capsule by mouth daily. fruit full antioxidant   polyethylene glycol (MIRALAX / GLYCOLAX) 17 g packet Take 17 g by mouth daily as needed for mild constipation.   tirzepatide 5 MG/0.5ML injection vial Inject 5 mg into the skin once a  week.   UNABLE TO FIND Med Name: vegetable blend   No facility-administered encounter medications on file as of 09/19/2023.    Allergies (verified) Levofloxacin in d5w, Mango flavor, and Bupropion   History: Past Medical History:  Diagnosis Date   Anal fissure    Arthritis    Bursitis    Constipation    COPD (chronic obstructive pulmonary disease) (HCC)    Diverticulitis of colon 02/2015   Ectopic pregnancy    Emphysema of lung (HCC)    Frozen shoulder    Heart murmur    Hernia, abdominal    Hyperlipidemia    Hyperthyroidism    Infertility, female    Irregular heartbeat    Joint pain    Obesity    Osteoarthritis    Pneumonia    Pre-diabetes    Sleep apnea    Swallowing difficulty    Thyroid disease    hyperthyroid   Past Surgical History:  Procedure Laterality Date   APPENDECTOMY     CESAREAN SECTION     COLONOSCOPY  05/27/2022   per Dr. Lavon Paganini, no polyps, repeat 10 yrs   COLOSTOMY N/A 02/20/2015   Procedure: COLOSTOMY;  Surgeon: Avel Peace, MD;  Location: [facility];  Service: General;  Laterality: N/A;   COLOSTOMY REVISION  02/20/2015   Procedure: Sigmoid Colectomy;  Surgeon: Avel Peace, MD;  Location: [facility];  Service: General;;   CYSTOSCOPY N/A 02/20/2015   Procedure: Bluford Kaufmann;  Surgeon: Ihor Gully, MD;  Location: [facility];  Service: Urology;  Laterality: N/A;   DILATION AND CURETTAGE OF UTERUS     x3   ECTOPIC PREGNANCY SURGERY     ILEO LOOP COLOSTOMY CLOSURE N/A 09/26/2015   Procedure: LAPAROSCOPIC LYSIS OF ADHESIONS, COLOSTOMY CLOSURE;  Surgeon: Avel Peace, MD;  Location: [facility];  Service: General;  Laterality: N/A;   LAPAROTOMY N/A 02/20/2015   Procedure: Emergency EXPLORATORY and drainiage of interabdominal abcesses;  Surgeon: Avel Peace, MD;  Location: [facility];  Service: General;  Laterality: N/A;   SALPINGOOPHORECTOMY Left 02/20/2015   Procedure: SALPINGO OOPHORECTOMY;  Surgeon: Avel Peace, MD;  Location: [facility];  Service:  General;  Laterality: Left;   TONSILLECTOMY  1980   TOTAL HIP ARTHROPLASTY Left 02/23/2022   Procedure: TOTAL HIP ARTHROPLASTY ANTERIOR APPROACH;  Surgeon: Durene Romans, MD;  Location: [facility];  Service: Orthopedics;  Laterality: Left;   Family History  Problem Relation Age of Onset   Breast cancer Mother    High blood pressure Mother    Kidney disease Mother    Sleep apnea Mother    Obesity Mother    CAD Father        CABG   Skin cancer Father    Lung cancer Father    Diverticulitis Father    Colon polyps Father    High blood pressure Father    Stroke Father    Heart disease Father    Sleep apnea Father    Allergies Brother  Diabetes Brother    Breast cancer Maternal Grandmother    Heart attack Maternal Grandfather    Heart attack Paternal Grandfather    COPD Other    Cancer Other    Heart disease Other    Stroke Other    Social History   Socioeconomic History   Marital status: Married    Spouse name: Not on file   Number of children: 1   Years of education: Not on file   Highest education level: Not on file  Occupational History   Occupation: Geneticist, 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[employer]    Employer: [employer]  Tobacco Use   Smoking status: Every Day    Current packs/day: 0.25    Average packs/day: 0.3 packs/day for 40.0 years (10.0 ttl pk-yrs)    Types: Cigarettes   Smokeless tobacco: Never  Vaping Use   Vaping status: Never Used  Substance and Sexual Activity   Alcohol use: Not Currently   Drug use: No   Sexual activity: Not Currently    Partners: Male    Birth control/protection: Post-menopausal    Comment: 1ST intercourse- 21, partners- 3  Other Topics Concern   Not on file  Social History Narrative   Not on file   Social Determinants of Health   Financial Resource Strain: Low Risk  (09/15/2023)   Overall Financial Resource Strain (CARDIA)    Difficulty of Paying Living Expenses: Not hard at all  Food Insecurity: No Food  Insecurity (09/15/2023)   Hunger Vital Sign    Worried About Running Out of Food in the Last Year: Never true    Ran Out of Food in the Last Year: Never true  Transportation Needs: No Transportation Needs (09/15/2023)   PRAPARE - Administrator, Civil Service (Medical): No    Lack of Transportation (Non-Medical): No  Physical Activity: Insufficiently Active (09/15/2023)   Exercise Vital Sign    Days of Exercise per Week: 2 days    Minutes of Exercise per Session: 10 min  Stress: No Stress Concern Present (09/15/2023)   [employer] of Occupational Health - Occupational Stress Questionnaire    Feeling of Stress : Only a little  Social Connections: Unknown (09/15/2023)   Social Connection and Isolation Panel [NHANES]    Frequency of Communication with Friends and Family: More than three times a week    Frequency of Social Gatherings with Friends and Family: Three times a week    Attends Religious Services: Not on file    Active Member of Clubs or Organizations: Yes    Attends Banker Meetings: More than 4 times per year    Marital Status: Married    Tobacco Counseling Ready to quit: Yes Counseling given: Yes   Clinical Intake:  Pre-visit preparation completed: Yes  Pain : No/denies pain     BMI - recorded: 38.54 Nutritional Status: BMI > 30  Obese Nutritional Risks: None Diabetes: No  How often do you need to have someone help you when you read instructions, pamphlets, or other written materials from your doctor or pharmacy?: 1 - Never  Interpreter Needed?: No  Information entered by :: Theresa Mulligan LPN   Activities of Daily Living    09/15/2023   10:40 AM 08/25/2023    5:55 PM  In your present state of health, do you have any difficulty performing the following activities:  Hearing? 0 0  Vision? 0 0  Difficulty concentrating or making decisions? 0 0  Walking or climbing  stairs? 1 1  Comment Uses [employer] or  bathing? 0 0  Doing errands, shopping? 0 0  Preparing Food and eating ? N N  Using the Toilet? N N  In the past six months, have you accidently leaked urine? N N  Do you have problems with loss of bowel control? N N  Managing your Medications? N N  Managing your Finances? N N  Housekeeping or managing your Housekeeping? N N    Patient Care Team: Nelwyn Salisbury, MD as PCP - General [place] James Nile Dear, MD as PCP - Cardiology (Cardiology)  Indicate any recent Medical Services you may have received from other than [facility] providers in the past year (date may be approximate).     Assessment:   This is a routine wellness examination for Y[place].  Hearing/Vision screen Hearing Screening - Comments:: Denies hearing difficulties   Vision Screening - Comments:: Wears rx glasses - up to date with routine eye exams with  [facility]   Goals Addressed               This Visit's Progress     Lose weight (pt-stated)         Depression Screen    09/19/2023    3:56 PM 05/04/2023    9:24 AM 03/26/2022    8:53 AM 01/27/2021    9:16 AM 02/21/2019    3:18 PM 01/11/2019    9:40 AM 07/26/2017    8:23 AM  PHQ 2/9 Scores  PHQ - 2 Score 0 1 1 0 1 3 0  PHQ- 9 Score  3 2  3 11    Exception Documentation      Medical reason     Fall Risk    09/19/2023    3:46 PM 09/15/2023   10:40 AM 08/25/2023    5:55 PM 05/04/2023    9:23 AM 04/06/2023    8:38 AM  Fall Risk   Falls in the past year? 0 0 0 0 0  Number falls in past yr: 0 0 0 0 0  Injury with Fall? 0 0 0 0 0  Risk for fall due to : No Fall Risks   No Fall Risks No Fall Risks  Follow up Falls prevention discussed   Falls evaluation completed Falls evaluation completed    MEDICARE RISK AT HOME: Medicare Risk at Home Any stairs in or around the home?: Yes If so, are there any without handrails?: Yes Home free of loose throw rugs in walkways, pet beds, electrical cords, etc?: Yes Adequate lighting in your home to reduce risk of  falls?: Yes Life alert?: No Use of a cane, walker or w/c?: Yes Grab bars in the bathroom?: No Shower chair or bench in shower?: Yes Elevated toilet seat or a handicapped toilet?: No  TIMED UP AND GO:  Was the test performed?  No    Cognitive Function:        09/19/2023    3:47 PM  6CIT Screen  What Year? 0 points  What month? 0 points  What time? 0 points  Count back from 20 0 points  Months in reverse 0 points  Repeat phrase 0 points  Total Score 0 points    Immunizations Immunization History  Administered Date(s) Administered   Influenza Split 07/13/2011, 08/04/2012, 07/23/2013   Influenza,inj,Quad PF,6+ Mos 12/31/2015, 07/11/2018, 07/15/2019, 07/24/2020   Influenza-Unspecified 07/30/2014, 07/27/2016, 08/02/2017, 07/16/2022, 08/19/2023   PFIZER(Purple Top)SARS-COV-2 Vaccination 11/04/2019, 11/25/2019, 09/24/2021   PNEUMOCOCCAL  CONJUGATE-20 05/04/2023   Tdap 01/20/2011, 07/27/2016   Unspecified SARS-COV-2 Vaccination 07/16/2022    TDAP status: Up to date   Flu Vaccine: Up to date  Pneumococcal vaccine status: Up to date  Covid-19 vaccine status: Declined, Education has been provided regarding the importance of this vaccine but patient still declined. Advised may receive this vaccine at local pharmacy or Health Dept.or vaccine clinic. Aware to provide a copy of the vaccination record if obtained from local pharmacy or Health Dept. Verbalized acceptance and understanding.  Qualifies for Shingles Vaccine? Yes   Zostavax completed No   Shingrix Completed?: No.    Education has been provided regarding the importance of this vaccine. Patient has been advised to call insurance company to determine out of pocket expense if they have not yet received this vaccine. Advised may also receive vaccine at local pharmacy or Health Dept. Verbalized acceptance and understanding.  Screening Tests Health Maintenance  Topic Date Due   FOOT EXAM  Never done   Hepatitis C Screening   Never done   Zoster Vaccines- Shingrix (1 of 2) Never done   Diabetic kidney evaluation - Urine ACR  05/29/2015   COVID-19 Vaccine (5 - 2023-24 season) 06/26/2023   HEMOGLOBIN A1C  11/04/2023   Diabetic kidney evaluation - eGFR measurement  05/03/2024   OPHTHALMOLOGY EXAM  05/31/2024   Medicare Annual Wellness (AWV)  09/18/2024   MAMMOGRAM  05/15/2025   DTaP/Tdap/Td (3 - Td or Tdap) 07/27/2026   Colonoscopy  05/27/2032   Pneumonia Vaccine 73+ Years old  Completed   INFLUENZA VACCINE  Completed   DEXA SCAN  Completed   HPV VACCINES  Aged Out    Health Maintenance  Health Maintenance Due  Topic Date Due   FOOT EXAM  Never done   Hepatitis C Screening  Never done   Zoster Vaccines- Shingrix (1 of 2) Never done   Diabetic kidney evaluation - Urine ACR  05/29/2015   COVID-19 Vaccine (5 - 2023-24 season) 06/26/2023    Colorectal cancer screening: Type of screening: Colonoscopy. Completed 05/27/22. Repeat every 10 years  Mammogram status: Completed 05/16/23. Repeat every year  Bone Density status: Completed 10/07/20. Results reflect: Bone density results: NORMAL. Repeat every   years.    Additional Screening:  Hepatitis C Screening: does qualify; Deferred  Vision Screening: Recommended annual ophthalmology exams for early detection of glaucoma and other disorders of the eye. Is the patient up to date with their annual eye exam?  Yes  Who is the provider or what is the name of the office in which the patient attends annual eye exams? [facility] If pt is not established with a provider, would they like to be referred to a provider to establish care? No .   Dental Screening: Recommended annual dental exams for proper oral hygiene   Community Resource Referral / Chronic Care Management:  CRR required this visit?  No   CCM required this visit?  No     Plan:     I have personally reviewed and noted the following in the patient's chart:   Medical and social  history Use of alcohol, tobacco or illicit drugs  Current medications and supplements including opioid prescriptions. Patient is not currently taking opioid prescriptions. Functional ability and status Nutritional status Physical activity Advanced directives List of other physicians Hospitalizations, surgeries, and ER visits in previous 12 months Vitals Screenings to include cognitive, depression, and falls Referrals and appointments  In addition, I have reviewed and discussed with  patient certain preventive protocols, quality metrics, and best practice recommendations. A written personalized care plan for preventive services as well as general preventive health recommendations were provided to patient.     Tillie Rung, LPN   16/07/9603   After Visit Summary: (MyChart) Due to this being a telephonic visit, the after visit summary with patients personalized plan was offered to patient via MyChart   Nurse Notes: None

## 2023-09-19 NOTE — Patient Instructions (Addendum)
Ms. Mottley , Thank you for taking time to come for your Medicare Wellness Visit. I appreciate your ongoing commitment to your health goals. Please review the following plan we discussed and let me know if I can assist you in the future.   Referrals/Orders/Follow-Ups/Clinician Recommendations:   This is a list of the screening recommended for you and due dates:  Health Maintenance  Topic Date Due   Complete foot exam   Never done   Hepatitis C Screening  Never done   Zoster (Shingles) Vaccine (1 of 2) Never done   Yearly kidney health urinalysis for diabetes  05/29/2015   COVID-19 Vaccine (5 - 2023-24 season) 06/26/2023   Hemoglobin A1C  11/04/2023   Yearly kidney function blood test for diabetes  05/03/2024   Eye exam for diabetics  05/31/2024   Medicare Annual Wellness Visit  09/18/2024   Mammogram  05/15/2025   DTaP/Tdap/Td vaccine (3 - Td or Tdap) 07/27/2026   Colon Cancer Screening  05/27/2032   Pneumonia Vaccine  Completed   Flu Shot  Completed   DEXA scan (bone density measurement)  Completed   HPV Vaccine  Aged Out    Advanced directives: (Copy Requested) Please bring a copy of your health care power of attorney and living will to the office to be added to your chart at your convenience.  Next Medicare Annual Wellness Visit scheduled for next year: Yes

## 2023-09-29 ENCOUNTER — Encounter: Payer: Self-pay | Admitting: Family Medicine

## 2023-09-30 MED ORDER — ZEPBOUND 7.5 MG/0.5ML ~~LOC~~ SOAJ: 7.5000 mg | SUBCUTANEOUS | 5 refills | Status: DC

## 2023-09-30 NOTE — Telephone Encounter (Signed)
I sent in the 7.5  mg dose

## 2023-10-07 DIAGNOSIS — H43812 Vitreous degeneration, left eye: Secondary | ICD-10-CM | POA: Diagnosis not present

## 2023-10-10 ENCOUNTER — Other Ambulatory Visit (HOSPITAL_COMMUNITY)
Admission: RE | Admit: 2023-10-10 | Discharge: 2023-10-10 | Disposition: A | Discharge status: Home / Self Care | Payer: Self-pay | Source: Ambulatory Visit | Attending: Oncology | Admitting: Oncology

## 2023-10-10 DIAGNOSIS — Z006 Encounter for examination for normal comparison and control in clinical research program: Secondary | ICD-10-CM

## 2023-10-24 LAB — GENECONNECT MOLECULAR SCREEN: Genetic Analysis Overall Interpretation: NEGATIVE

## 2023-10-29 ENCOUNTER — Other Ambulatory Visit: Payer: Self-pay | Admitting: Family Medicine

## 2023-11-14 ENCOUNTER — Other Ambulatory Visit: Payer: Self-pay | Admitting: Family Medicine

## 2023-11-14 ENCOUNTER — Encounter: Payer: Self-pay | Admitting: Family Medicine

## 2023-11-15 NOTE — Telephone Encounter (Signed)
She will need an OV for this  

## 2023-11-16 ENCOUNTER — Ambulatory Visit: Payer: Self-pay | Admitting: Family Medicine

## 2023-11-16 MED ORDER — CELECOXIB 200 MG PO CAPS: 200.0000 mg | ORAL_CAPSULE | Freq: Two times a day (BID) | ORAL | 0 refills | Status: DC

## 2023-11-16 NOTE — Telephone Encounter (Addendum)
Copied from CRM [id number]. Topic: Clinical - Red Word Triage >> Nov 16, 2023  3:36 PM Sonny Dandy B wrote: [facility] that prompted transfer to Nurse Triage: diverticulititis  pt called for an appt, system will not allow me to schedule her an appt. State send to triage nurse.    Chief Complaint: Diverticulitis flare up Symptoms: Abd pain, diarrhea, vomiting, fatigue Disposition: [] ED /[] Urgent Care (no appt availability in office) / [x] Appointment(In office/virtual)/ []  [facility]/ [] Home Care/ [] Refused Recommended Disposition /[] [facility]/ []  Follow-up with PCP Additional Notes: Patient stated she has hx of diverticulitis and she believes she is having a flare up. She started having abd pain on Sunday. She stated the pain comes and goes and varies in intensity. Her pain is mild right now. She had one episode of vomiting yesterday and 2 loose bowel movements today. Appt scheduled for 1/23 with different provider. PCP did not have availability until Friday.    Reason for Disposition  Age > 60 years  Answer Assessment - Initial Assessment Questions 1. LOCATION: "Where does it hurt?"      Left side lower abd  2. ONSET: "When did the pain begin?" (e.g., minutes, hours or days ago)      Sunday night, worsened on Monday  3. PATTERN "Does the pain come and go, or is it constant?"    - If it comes and goes: "How long does it last?" "Do you have pain now?"     (Note: Comes and goes means the pain is intermittent. It goes away completely between bouts.)    - If constant: "Is it getting better, staying the same, or getting worse?"      (Note: Constant means the pain never goes away completely; most serious pain is constant and gets worse.)  Comes and goes      4. SEVERITY: "How bad is the pain?"  (e.g., Scale 1-10; mild, moderate, or severe)    - MILD (1-3): Doesn't interfere with normal activities, abdomen soft and not tender to touch.     - MODERATE (4-7): Interferes with  normal activities or awakens from sleep, abdomen tender to touch.     - SEVERE (8-10): Excruciating pain, doubled over, unable to do any normal activities.       Sharp, dull, and aching pains/ pain level is mild right now, but it can be a 7 or 8 at times  6. RECURRENT SYMPTOM: "Have you ever had this type of stomach pain before?" If Yes, ask: "When was the last time?" and "What happened that time?"      Yes, patient stated the last time was over a year ago and she has a hx of diverticulitis  7. OTHER SYMPTOMS: "Do you have any other symptoms?" (e.g., back pain, diarrhea, fever, urination pain, vomiting)       Fatigue, 1 vomiting yesterday, 2 loose BM today  Protocols used: Abdominal Pain - Female-A-AH

## 2023-11-17 ENCOUNTER — Ambulatory Visit: Payer: Medicare PPO | Admitting: Adult Health

## 2023-11-17 VITALS — BP 120/70 | HR 74 | Temp 97.9°F | Ht 60.25 in | Wt 193.0 lb

## 2023-11-17 DIAGNOSIS — K5792 Diverticulitis of intestine, part unspecified, without perforation or abscess without bleeding: Secondary | ICD-10-CM

## 2023-11-17 DIAGNOSIS — L89893 Pressure ulcer of other site, stage 3: Secondary | ICD-10-CM | POA: Diagnosis not present

## 2023-11-17 MED ORDER — AMOXICILLIN-POT CLAVULANATE 875-125 MG PO TABS
1.0000 | ORAL_TABLET | Freq: Two times a day (BID) | ORAL | 0 refills | Status: AC
Start: 2023-11-17 — End: 2023-12-01

## 2023-11-17 NOTE — Progress Notes (Signed)
Subjective:    Patient ID: Alison Best, female    DOB: 1955-11-16, 68 y.o.   MRN: [id number]  HPI  68 year old female who  has a past medical history of Anal fissure, Arthritis, Bursitis, Constipation, COPD (chronic obstructive pulmonary disease) (HCC), Diverticulitis of colon (02/2015), Ectopic pregnancy, Emphysema of lung (HCC), Frozen shoulder, Heart murmur, Hernia, abdominal, Hyperlipidemia, Hyperthyroidism, Infertility, female, Irregular heartbeat, Joint pain, Obesity, Osteoarthritis, Pneumonia, Pre-diabetes, Sleep apnea, Swallowing difficulty, and Thyroid disease.  She is a patient of Dr. Clent Ridges who I am seeing today for an acute issue of abdominal pain. She does have a history of Diverticulitis with rupture in 2016. She started having left lower quadrant abdominal pain,fatigue, intermittent diarrhea/constipation and vomiting 4 days ago. Symptoms feel consistent with previous diverticulitis flares. No fevers or chills.   Additionally over the last six month she has had a sore/wound on her left lower abdomen. Believes that it started out as a small wound the looked like a tick bite and then just progressed from there. She has been appling neosporin and non stick bandages without improvement. Over the last 3 weeks the wound has become worse.      Review of Systems See HPI   Past Medical History:  Diagnosis Date   Anal fissure    Arthritis    Bursitis    Constipation    COPD (chronic obstructive pulmonary disease) (HCC)    Diverticulitis of colon 02/2015   Ectopic pregnancy    Emphysema of lung (HCC)    Frozen shoulder    Heart murmur    Hernia, abdominal    Hyperlipidemia    Hyperthyroidism    Infertility, female    Irregular heartbeat    Joint pain    Obesity    Osteoarthritis    Pneumonia    Pre-diabetes    Sleep apnea    Swallowing difficulty    Thyroid disease    hyperthyroid    Social History   Socioeconomic History   Marital status: Married     Spouse name: Not on file   Number of children: 1   Years of education: Not on file   Highest education level: Master's degree (e.g., MA, MS, MEng, MEd, MSW, MBA)  Occupational History   Occupation: Geneticist, 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[employer]    Employer: [employer]  Tobacco Use   Smoking status: Every Day    Current packs/day: 0.25    Average packs/day: 0.3 packs/day for 40.0 years (10.0 ttl pk-yrs)    Types: Cigarettes   Smokeless tobacco: Never  Vaping Use   Vaping status: Never Used  Substance and Sexual Activity   Alcohol use: Not Currently   Drug use: No   Sexual activity: Not Currently    Partners: Male    Birth control/protection: Post-menopausal    Comment: 1ST intercourse- 21, partners- 3  Other Topics Concern   Not on file  Social History Narrative   Not on file   Social Drivers of Health   Financial Resource Strain: Low Risk  (11/16/2023)   Overall Financial Resource Strain (CARDIA)    Difficulty of Paying Living Expenses: Not hard at all  Food Insecurity: No Food Insecurity (11/16/2023)   Hunger Vital Sign    Worried About Running Out of Food in the Last Year: Never true    Ran Out of Food in the Last Year: Never true  Transportation Needs: No Transportation Needs (11/16/2023)   PRAPARE - Transportation    Lack  of Transportation (Medical): No    Lack of Transportation (Non-Medical): No  Physical Activity: Insufficiently Active (11/16/2023)   Exercise Vital Sign    Days of Exercise per Week: 1 day    Minutes of Exercise per Session: 10 min  Stress: No Stress Concern Present (11/16/2023)   [employer] of Occupational Health - Occupational Stress Questionnaire    Feeling of Stress : Only a little  Social Connections: Socially Integrated (11/16/2023)   Social Connection and Isolation Panel [NHANES]    Frequency of Communication with Friends and Family: Three times a week    Frequency of Social Gatherings with Friends and Family: Twice a week     Attends Religious Services: More than 4 times per year    Active Member of Clubs or Organizations: Yes    Attends Banker Meetings: More than 4 times per year    Marital Status: Married  Catering manager Violence: Not At Risk (09/19/2023)   Humiliation, Afraid, Rape, and Kick questionnaire    Fear of Current or Ex-Partner: No    Emotionally Abused: No    Physically Abused: No    Sexually Abused: No    Past Surgical History:  Procedure Laterality Date   APPENDECTOMY     CESAREAN SECTION     COLONOSCOPY  05/27/2022   per Dr. Lavon Paganini, no polyps, repeat 10 yrs   COLOSTOMY N/A 02/20/2015   Procedure: COLOSTOMY;  Surgeon: Avel Peace, MD;  Location: [facility];  Service: General;  Laterality: N/A;   COLOSTOMY REVISION  02/20/2015   Procedure: Sigmoid Colectomy;  Surgeon: Avel Peace, MD;  Location: [facility];  Service: General;;   CYSTOSCOPY N/A 02/20/2015   Procedure: Bluford Kaufmann;  Surgeon: Ihor Gully, MD;  Location: [facility];  Service: Urology;  Laterality: N/A;   DILATION AND CURETTAGE OF UTERUS     x3   ECTOPIC PREGNANCY SURGERY     ILEO LOOP COLOSTOMY CLOSURE N/A 09/26/2015   Procedure: LAPAROSCOPIC LYSIS OF ADHESIONS, COLOSTOMY CLOSURE;  Surgeon: Avel Peace, MD;  Location: [facility];  Service: General;  Laterality: N/A;   LAPAROTOMY N/A 02/20/2015   Procedure: Emergency EXPLORATORY and drainiage of interabdominal abcesses;  Surgeon: Avel Peace, MD;  Location: [facility];  Service: General;  Laterality: N/A;   SALPINGOOPHORECTOMY Left 02/20/2015   Procedure: SALPINGO OOPHORECTOMY;  Surgeon: Avel Peace, MD;  Location: [facility];  Service: General;  Laterality: Left;   TONSILLECTOMY  1980   TOTAL HIP ARTHROPLASTY Left 02/23/2022   Procedure: TOTAL HIP ARTHROPLASTY ANTERIOR APPROACH;  Surgeon: Durene Romans, MD;  Location: [facility];  Service: Orthopedics;  Laterality: Left;    Family History  Problem Relation Age of Onset   Breast cancer Mother    High blood  pressure Mother    Kidney disease Mother    Sleep apnea Mother    Obesity Mother    CAD Father        CABG   Skin cancer Father    Lung cancer Father    Diverticulitis Father    Colon polyps Father    High blood pressure Father    Stroke Father    Heart disease Father    Sleep apnea Father    Allergies Brother    Diabetes Brother    Breast cancer Maternal Grandmother    Heart attack Maternal Grandfather    Heart attack Paternal Grandfather    COPD Other    Cancer Other    Heart disease Other    Stroke Other  Allergies  Allergen Reactions   Levofloxacin In D5w Other (See Comments)    Achilles tendon weakness   Mango Flavoring Agent (Non-Screening) Swelling    fruit   Bupropion Nausea And Vomiting    Current Outpatient Medications on File Prior to Visit  Medication Sig Dispense Refill   acetaZOLAMIDE ER (DIAMOX) 500 MG capsule Take 1 capsule (500 mg total) by mouth 2 (two) times daily. When travelling at high altitudes 60 capsule 2   atorvastatin (LIPITOR) 20 MG tablet TAKE 1 TABLET(20 MG) BY MOUTH DAILY 90 tablet 3   betamethasone dipropionate 0.05 % cream Apply 1 application. topically 2 (two) times daily as needed (eczema). 45 g 5   celecoxib (CELEBREX) 200 MG capsule Take 1 capsule (200 mg total) by mouth 2 (two) times daily. 180 capsule 0   Coenzyme Q10 (CO Q 10 PO) Take 200 mg by mouth daily.     COLLAGEN PO Take 15 mLs by mouth daily. Liquid     diltiazem (CARDIZEM CD) 120 MG 24 hr capsule TAKE 1 CAPSULE(120 MG) BY MOUTH DAILY 90 capsule 3   EPINEPHrine 0.3 mg/0.3 mL IJ SOAJ injection Inject 0.3 mg into the muscle as needed for anaphylaxis. 1 each 2   fenofibrate micronized (LOFIBRA) 134 MG capsule TAKE 1 CAPSULE(134 MG) BY MOUTH DAILY 90 capsule 1   furosemide (LASIX) 20 MG tablet TAKE 1 TABLET(20 MG) BY MOUTH DAILY AS NEEDED FOR FLUID RETENTION OR SWELLING 30 tablet 2   ketoconazole (NIZORAL) 2 % cream Apply 1 application. topically 2 (two) times daily as  needed for irritation (fungal infections). 30 g 5   MAGNESIUM PO Take by mouth.     metFORMIN (GLUCOPHAGE) 500 MG tablet TAKE 1 TABLET(500 MG) BY MOUTH TWICE DAILY 180 tablet 3   Multiple Vitamins-Minerals (MULTIVITAMIN/EXTRA VITAMIN D3 PO) Take 1 tablet by mouth daily.     mupirocin ointment (BACTROBAN) 2 % Apply inside both nostrils twice a day 30 g 5   OVER THE COUNTER MEDICATION Take 1 tablet by mouth daily. Saffron     OVER THE COUNTER MEDICATION Take 2 capsules by mouth daily. Memory and Brain     OVER THE COUNTER MEDICATION Take 1 capsule by mouth daily. Cruciferous extract     OVER THE COUNTER MEDICATION Take 1 capsule by mouth daily. fruit full antioxidant     polyethylene glycol (MIRALAX / GLYCOLAX) 17 g packet Take 17 g by mouth daily as needed for mild constipation. 14 each 0   tirzepatide (ZEPBOUND) 7.5 MG/0.5ML Pen Inject 7.5 mg into the skin once a week. 6 mL 5   UNABLE TO FIND Med Name: vegetable blend     No current facility-administered medications on file prior to visit.    Temp 97.9 F (36.6 C)   Ht 5' 0.25" (1.53 m)   Wt 193 lb (87.5 kg)   LMP 02/23/2012 Comment: not sexually active  BMI 37.38 kg/m       Objective:   Physical Exam Vitals and nursing note reviewed.  Constitutional:      Appearance: Normal appearance. She is obese.  Abdominal:     General: Abdomen is flat. Bowel sounds are normal.     Palpations: Abdomen is soft.     Tenderness: There is abdominal tenderness in the left lower quadrant.    Musculoskeletal:        General: Normal range of motion.  Skin:    General: Skin is warm and dry.  Neurological:     General:  No focal deficit present.     Mental Status: She is oriented to person, place, and time.  Psychiatric:        Mood and Affect: Mood normal.        Behavior: Behavior normal.        Thought Content: Thought content normal.        Judgment: Judgment normal.        Assessment & Plan:   1. Diverticulitis (Primary) - Will  treat due to symptoms  - amoxicillin-clavulanate (AUGMENTIN) 875-125 MG tablet; Take 1 tablet by mouth 2 (two) times daily for 14 days.  Dispense: 28 tablet; Refill: 0  2. Pressure injury of other site, stage 3 (HCC) - No signs of infection currently. Placed hydrocolloid dressing over wound. Will send to wound care.  - AMB referral to wound care center   Shirline Frees, NP

## 2023-11-18 ENCOUNTER — Encounter: Payer: Self-pay | Admitting: Adult Health

## 2023-11-18 ENCOUNTER — Other Ambulatory Visit: Payer: Self-pay | Admitting: Adult Health

## 2023-11-18 DIAGNOSIS — L89893 Pressure ulcer of other site, stage 3: Secondary | ICD-10-CM

## 2023-11-18 NOTE — Telephone Encounter (Signed)
FYI

## 2023-11-18 NOTE — Telephone Encounter (Signed)
Pt had an appointment with Kandee Keen on 11/17/23

## 2023-12-08 DIAGNOSIS — Z8719 Personal history of other diseases of the digestive system: Secondary | ICD-10-CM | POA: Diagnosis not present

## 2023-12-08 DIAGNOSIS — K458 Other specified abdominal hernia without obstruction or gangrene: Secondary | ICD-10-CM | POA: Diagnosis not present

## 2023-12-08 DIAGNOSIS — S31109A Unspecified open wound of abdominal wall, unspecified quadrant without penetration into peritoneal cavity, initial encounter: Secondary | ICD-10-CM | POA: Diagnosis not present

## 2023-12-08 DIAGNOSIS — L98492 Non-pressure chronic ulcer of skin of other sites with fat layer exposed: Secondary | ICD-10-CM | POA: Diagnosis not present

## 2023-12-08 DIAGNOSIS — F17210 Nicotine dependence, cigarettes, uncomplicated: Secondary | ICD-10-CM | POA: Diagnosis not present

## 2023-12-26 DIAGNOSIS — L98492 Non-pressure chronic ulcer of skin of other sites with fat layer exposed: Secondary | ICD-10-CM | POA: Diagnosis not present

## 2023-12-26 DIAGNOSIS — F17210 Nicotine dependence, cigarettes, uncomplicated: Secondary | ICD-10-CM | POA: Diagnosis not present

## 2023-12-26 DIAGNOSIS — S31109A Unspecified open wound of abdominal wall, unspecified quadrant without penetration into peritoneal cavity, initial encounter: Secondary | ICD-10-CM | POA: Diagnosis not present

## 2023-12-26 DIAGNOSIS — R7303 Prediabetes: Secondary | ICD-10-CM | POA: Diagnosis not present

## 2023-12-27 ENCOUNTER — Telehealth: Payer: Self-pay

## 2023-12-27 ENCOUNTER — Other Ambulatory Visit (HOSPITAL_COMMUNITY): Payer: Self-pay

## 2023-12-27 NOTE — Telephone Encounter (Signed)
 Pharmacy Patient Advocate Encounter  Received notification from [facility] that Prior Authorization for Zepbound 7.5MG /0.5ML pen-injectors has been APPROVED from 10/26/23 to 10/24/24. Ran test claim, Copay is $100. This test claim was processed through [facility]- copay amounts may vary at other pharmacies due to pharmacy/plan contracts, or as the patient moves through the different stages of their insurance plan.   PA #/Case ID/Reference #: [id number]

## 2023-12-28 ENCOUNTER — Other Ambulatory Visit (HOSPITAL_COMMUNITY): Payer: Self-pay

## 2024-01-01 ENCOUNTER — Encounter: Payer: Self-pay | Admitting: Family Medicine

## 2024-01-01 ENCOUNTER — Other Ambulatory Visit: Payer: Self-pay | Admitting: Physician Assistant

## 2024-01-04 NOTE — Telephone Encounter (Signed)
 Per the CDC recommendations, your age doesn't really matter. A booster is advised for those who are in endemic areas, who work in a health care setting, who are travelling internationally, and who are at high risk. I do not think she needs a booster unless she travels internationally

## 2024-01-05 ENCOUNTER — Ambulatory Visit (HOSPITAL_BASED_OUTPATIENT_CLINIC_OR_DEPARTMENT_OTHER): Payer: Medicare PPO | Admitting: Internal Medicine

## 2024-01-09 DIAGNOSIS — F17210 Nicotine dependence, cigarettes, uncomplicated: Secondary | ICD-10-CM | POA: Diagnosis not present

## 2024-01-09 DIAGNOSIS — S31109A Unspecified open wound of abdominal wall, unspecified quadrant without penetration into peritoneal cavity, initial encounter: Secondary | ICD-10-CM | POA: Diagnosis not present

## 2024-01-09 DIAGNOSIS — R7303 Prediabetes: Secondary | ICD-10-CM | POA: Diagnosis not present

## 2024-01-09 DIAGNOSIS — L98492 Non-pressure chronic ulcer of skin of other sites with fat layer exposed: Secondary | ICD-10-CM | POA: Diagnosis not present

## 2024-01-10 DIAGNOSIS — S31109A Unspecified open wound of abdominal wall, unspecified quadrant without penetration into peritoneal cavity, initial encounter: Secondary | ICD-10-CM | POA: Diagnosis not present

## 2024-01-13 NOTE — Telephone Encounter (Signed)
 We don't administer this at the office, check with your pharmacy or the health department

## 2024-01-20 ENCOUNTER — Encounter: Payer: Self-pay | Admitting: Family Medicine

## 2024-01-23 DIAGNOSIS — L98492 Non-pressure chronic ulcer of skin of other sites with fat layer exposed: Secondary | ICD-10-CM | POA: Diagnosis not present

## 2024-01-23 DIAGNOSIS — F17210 Nicotine dependence, cigarettes, uncomplicated: Secondary | ICD-10-CM | POA: Diagnosis not present

## 2024-01-23 DIAGNOSIS — R7303 Prediabetes: Secondary | ICD-10-CM | POA: Diagnosis not present

## 2024-01-23 MED ORDER — TIRZEPATIDE-WEIGHT MANAGEMENT 10 MG/0.5ML ~~LOC~~ SOLN: 10.0000 mg | SUBCUTANEOUS | 5 refills | Status: DC

## 2024-01-23 NOTE — Progress Notes (Signed)
 Cardiology Office Note:  .   Date:  01/31/2024  ID:  Alison Best, DOB Oct 11, 1956, MRN [id number] PCP: Nelwyn Salisbury, MD  [facility] Providers Cardiologist:  Verne Carrow, MD    History of Present Illness: .   Alison Best is a 68 y.o. female  with history of paroxysmal atrial fibrillation, hyperlipidemia, sleep apnea, hyperthyroidism, carotid artery disease and tobacco abuse.    During her hospitalization in 2016 for perforated diverticulitis, her EKG showed T wave inversions and ST depression.Echo with normal LV function and TR with no other valve disease. Stress myoview 05/28/15 with no ischemia. When she presented for her echo she was in rapid atrial fib.Found to be hyperthyroid. She was started on Toprol and ASA given CHADS VASC score of 1.Carotid dopplers August 2016 with mild bilateral carotid disease.  She did not tolerate beta blockers due to fatigue. She has had no known recurrence of atrial fibrillation since 2016. Cardiac monitor March 2021 with PACs, PVCs, atrial tachycardia. Echo April 2021 with LVEF=60-65%, no significant valve disease.   Patient comes in for f/u. Has lost 45 lbs on terzepatide. Needs abdominal hernia repair and has to get her BMI down to 30. Walking with a cane, had hip replacement 2 yrs ago that has helped. Not able to exercise much. Being seen by wound care clinic and starting wound vac tomorrow for abdomen. Patient denies chest pain, dyspnea, edema. Only an occasional flutter that doesn't last. Not using lasix at all anymore. Smoking 1/2 ppd.   ROS:    Studies Reviewed: Marland Kitchen    EKG Interpretation Date/Time:  Tuesday January 31 2024 10:21:18 EDT Ventricular Rate:  70 PR Interval:  178 QRS Duration:  74 QT Interval:  376 QTC Calculation: 406 R Axis:   63  Text Interpretation: Normal sinus rhythm Normal ECG When compared with ECG of 28-May-2015 09:23, No significant change was found Confirmed by Jacolyn Reedy [phone]) on  01/31/2024 10:23:53 AM    Prior CV Studies:    Echo April 2021:  1. Left ventricular ejection fraction, by estimation, is 60 to 65%. The  left ventricle has normal function. The left ventricle has no regional  wall motion abnormalities. Left ventricular diastolic parameters were  normal.   2. Right ventricular systolic function is normal. The right ventricular  size is normal. There is mildly elevated pulmonary artery systolic  pressure.   3. Left atrial size was mildly dilated.   4. The mitral valve is normal in structure. Trivial mitral valve  regurgitation. No evidence of mitral stenosis.   5. The aortic valve is tricuspid. Aortic valve regurgitation is not  visualized. Mild aortic valve sclerosis is present, with no evidence of  aortic valve stenosis.   6. The inferior vena cava is normal in size with greater than 50%  respiratory variability, suggesting right atrial pressure of 3 mmHg.     Risk Assessment/Calculations:             Physical Exam:   VS:  BP 130/80 (BP Location: Left Arm, Patient Position: Sitting, Cuff Size: Large)   Pulse 68   Resp 16   Ht 5' (1.524 m)   Wt 185 lb (83.9 kg)   LMP 02/23/2012 Comment: not sexually active  SpO2 98%   BMI 36.13 kg/m    Wt Readings from Last 3 Encounters:  01/31/24 185 lb (83.9 kg)  11/17/23 193 lb (87.5 kg)  09/19/23 199 lb (90.3 kg)    GEN: Obese, in no  acute distress NECK: No JVD; No carotid bruits CARDIAC:  RRR, no murmurs, rubs, gallops RESPIRATORY: decreased breath sounds with scattered wheezing   ABDOMEN: Abdominal hernia and nonhealing wound that's bandaged. EXTREMITIES:  No edema; No deformity   ASSESSMENT AND PLAN: .    Paroxysmal atrial fibrillation: This occurred in the setting of hyperthyroidism. No recurrence since then. She has not been on long term anti-coagulation. Rare palpitations. Continue Cardizem.    Carotid disease: Mild bilateral disease by dopplers November 2020. Will hold off on repeating at  this time   HLD-on lipitor but didn't tolerate vascepa-GERD. LDL 34 trig 214 04/2023.   Tobacco abuse: Smoking cessation is advised. She's willing to try nicotine patches.   Leg edema-no leg edema or lasix needed since weight loss.   Obesity-has lost 45 lbs on tirzepatide               Dispo:    Signed, Jacolyn Reedy, PA-C

## 2024-01-23 NOTE — Telephone Encounter (Signed)
 Done

## 2024-01-27 DIAGNOSIS — S31104A Unspecified open wound of abdominal wall, left lower quadrant without penetration into peritoneal cavity, initial encounter: Secondary | ICD-10-CM | POA: Diagnosis not present

## 2024-01-28 ENCOUNTER — Other Ambulatory Visit: Payer: Self-pay | Admitting: Physician Assistant

## 2024-01-31 ENCOUNTER — Other Ambulatory Visit: Payer: Self-pay

## 2024-01-31 ENCOUNTER — Ambulatory Visit: Payer: Medicare PPO | Attending: Physician Assistant | Admitting: Physician Assistant

## 2024-01-31 ENCOUNTER — Encounter: Payer: Self-pay | Admitting: Physician Assistant

## 2024-01-31 VITALS — BP 130/80 | HR 68 | Resp 16 | Ht 60.0 in | Wt 185.0 lb

## 2024-01-31 DIAGNOSIS — Z6841 Body Mass Index (BMI) 40.0 and over, adult: Secondary | ICD-10-CM

## 2024-01-31 DIAGNOSIS — E66813 Obesity, class 3: Secondary | ICD-10-CM

## 2024-01-31 DIAGNOSIS — R0989 Other specified symptoms and signs involving the circulatory and respiratory systems: Secondary | ICD-10-CM

## 2024-01-31 DIAGNOSIS — M7989 Other specified soft tissue disorders: Secondary | ICD-10-CM

## 2024-01-31 DIAGNOSIS — I480 Paroxysmal atrial fibrillation: Secondary | ICD-10-CM | POA: Diagnosis not present

## 2024-01-31 DIAGNOSIS — E785 Hyperlipidemia, unspecified: Secondary | ICD-10-CM

## 2024-01-31 DIAGNOSIS — Z720 Tobacco use: Secondary | ICD-10-CM | POA: Diagnosis not present

## 2024-01-31 MED ORDER — ATORVASTATIN CALCIUM 20 MG PO TABS: ORAL_TABLET | ORAL | 0 refills | Status: DC

## 2024-01-31 MED ORDER — DILTIAZEM HCL ER COATED BEADS 120 MG PO CP24: ORAL_CAPSULE | ORAL | 3 refills | Status: AC

## 2024-01-31 MED ORDER — NICOTINE 14 MG/24HR TD PT24: MEDICATED_PATCH | TRANSDERMAL | 0 refills | Status: AC

## 2024-01-31 MED ORDER — NICOTINE 7 MG/24HR TD PT24: MEDICATED_PATCH | TRANSDERMAL | 0 refills | Status: AC

## 2024-01-31 MED ORDER — NICOTINE 21 MG/24HR TD PT24: MEDICATED_PATCH | TRANSDERMAL | 0 refills | Status: AC

## 2024-01-31 NOTE — Patient Instructions (Addendum)
 Medication Instructions:  Your physician has recommended you make the following change in your medication:  START NICOTINE PATCHES (SEE INSTRUCTIONS BELOW)   #1 Weeks 1-4: 21 mg x 1 patch daily. Wear for 24 hours. If you have sleep disturbances, remove at bedtime.  #2 Weeks 5-6: 14 mg x 1 patch daily. Wear for 24 hours. If you have sleep disturbances, remove at bedtime.  #3 Weeks 7-8: 7 mg x 1 patch daily. Wear for 24 hours. If you have sleep disturbances, remove at bedtime.  *If you need a refill on your cardiac medications before your next appointment, please call your pharmacy*  Lab Work: NONE If you have labs (blood work) drawn today and your tests are completely normal, you will receive your results only by: MyChart Message (if you have MyChart) OR A paper copy in the mail If you have any lab test that is abnormal or we need to change your treatment, we will call you to review the results.  Testing/Procedures: NONE  Follow-Up: At [facility], you and your health needs are our priority.  As part of our continuing mission to provide you with exceptional heart care, our providers are all part of one team.  This team includes your primary Cardiologist (physician) and Advanced Practice Providers or APPs (Physician Assistants and Nurse Practitioners) who all work together to provide you with the care you need, when you need it.  Your next appointment:   1 year  Provider:   Verne Carrow, MD, or Jacolyn Reedy, PA-C  We recommend signing up for the patient portal called "MyChart".  Sign up information is provided on this After Visit Summary.  MyChart is used to connect with patients for Virtual Visits (Telemedicine).  Patients are able to view lab/test results, encounter notes, upcoming appointments, etc.  Non-urgent messages can be sent to your provider as well.   To learn more about what you can do with MyChart, go to [email or web address].   Other Instructions        1st Floor: - Lobby - Registration  - Pharmacy  - Lab - Cafe  2nd Floor: - PV Lab - Diagnostic Testing (echo, CT, nuclear med)  3rd Floor: - Vacant  4th Floor: - TCTS (cardiothoracic surgery) - AFib Clinic - Structural Heart Clinic - Vascular Surgery  - Vascular Ultrasound  5th Floor: - HeartCare Cardiology (general and EP) - Clinical Pharmacy for coumadin, hypertension, lipid, weight-loss medications, and med management appointments    Valet parking services will be available as well.

## 2024-01-31 NOTE — Telephone Encounter (Signed)
 Requested Prescriptions   Signed Prescriptions Disp Refills   atorvastatin (LIPITOR) 20 MG tablet 90 tablet 0    Sig: TAKE 1 TABLET(20 MG) BY MOUTH DAILY    Authorizing Provider: Carlus Pavlov    Ordering User: Pollie Meyer

## 2024-02-01 DIAGNOSIS — F17210 Nicotine dependence, cigarettes, uncomplicated: Secondary | ICD-10-CM | POA: Diagnosis not present

## 2024-02-01 DIAGNOSIS — L98492 Non-pressure chronic ulcer of skin of other sites with fat layer exposed: Secondary | ICD-10-CM | POA: Diagnosis not present

## 2024-02-01 DIAGNOSIS — S31104D Unspecified open wound of abdominal wall, left lower quadrant without penetration into peritoneal cavity, subsequent encounter: Secondary | ICD-10-CM | POA: Diagnosis not present

## 2024-02-01 DIAGNOSIS — K469 Unspecified abdominal hernia without obstruction or gangrene: Secondary | ICD-10-CM | POA: Diagnosis not present

## 2024-02-03 DIAGNOSIS — S31104D Unspecified open wound of abdominal wall, left lower quadrant without penetration into peritoneal cavity, subsequent encounter: Secondary | ICD-10-CM | POA: Diagnosis not present

## 2024-02-03 DIAGNOSIS — K469 Unspecified abdominal hernia without obstruction or gangrene: Secondary | ICD-10-CM | POA: Diagnosis not present

## 2024-02-03 DIAGNOSIS — L98492 Non-pressure chronic ulcer of skin of other sites with fat layer exposed: Secondary | ICD-10-CM | POA: Diagnosis not present

## 2024-02-03 DIAGNOSIS — F17210 Nicotine dependence, cigarettes, uncomplicated: Secondary | ICD-10-CM | POA: Diagnosis not present

## 2024-02-06 DIAGNOSIS — K469 Unspecified abdominal hernia without obstruction or gangrene: Secondary | ICD-10-CM | POA: Diagnosis not present

## 2024-02-06 DIAGNOSIS — L98492 Non-pressure chronic ulcer of skin of other sites with fat layer exposed: Secondary | ICD-10-CM | POA: Diagnosis not present

## 2024-02-06 DIAGNOSIS — F17210 Nicotine dependence, cigarettes, uncomplicated: Secondary | ICD-10-CM | POA: Diagnosis not present

## 2024-02-06 DIAGNOSIS — S31104D Unspecified open wound of abdominal wall, left lower quadrant without penetration into peritoneal cavity, subsequent encounter: Secondary | ICD-10-CM | POA: Diagnosis not present

## 2024-02-08 DIAGNOSIS — S31104A Unspecified open wound of abdominal wall, left lower quadrant without penetration into peritoneal cavity, initial encounter: Secondary | ICD-10-CM | POA: Diagnosis not present

## 2024-02-13 ENCOUNTER — Encounter: Payer: Self-pay | Admitting: Internal Medicine

## 2024-02-13 ENCOUNTER — Ambulatory Visit: Payer: Medicare PPO | Admitting: Internal Medicine

## 2024-02-13 DIAGNOSIS — F17210 Nicotine dependence, cigarettes, uncomplicated: Secondary | ICD-10-CM | POA: Diagnosis not present

## 2024-02-13 DIAGNOSIS — K469 Unspecified abdominal hernia without obstruction or gangrene: Secondary | ICD-10-CM | POA: Diagnosis not present

## 2024-02-13 DIAGNOSIS — E0590 Thyrotoxicosis, unspecified without thyrotoxic crisis or storm: Secondary | ICD-10-CM

## 2024-02-13 DIAGNOSIS — E042 Nontoxic multinodular goiter: Secondary | ICD-10-CM

## 2024-02-13 DIAGNOSIS — E1165 Type 2 diabetes mellitus with hyperglycemia: Secondary | ICD-10-CM | POA: Diagnosis not present

## 2024-02-13 DIAGNOSIS — S31104D Unspecified open wound of abdominal wall, left lower quadrant without penetration into peritoneal cavity, subsequent encounter: Secondary | ICD-10-CM | POA: Diagnosis not present

## 2024-02-13 DIAGNOSIS — Z7984 Long term (current) use of oral hypoglycemic drugs: Secondary | ICD-10-CM

## 2024-02-13 DIAGNOSIS — L98492 Non-pressure chronic ulcer of skin of other sites with fat layer exposed: Secondary | ICD-10-CM | POA: Diagnosis not present

## 2024-02-13 MED ORDER — ACCU-CHEK GUIDE TEST VI STRP: ORAL_STRIP | 12 refills | Status: AC

## 2024-02-13 MED ORDER — ACCU-CHEK GUIDE W/DEVICE KIT
PACK | 0 refills | Status: AC
Start: 2024-02-13 — End: ?

## 2024-02-13 MED ORDER — ACCU-CHEK SOFTCLIX LANCETS MISC: 3 refills | Status: AC

## 2024-02-13 NOTE — Progress Notes (Signed)
 Patient ID: Alison Best [facility], female   DOB: Jun 28, 1956, 68 y.o.   MRN: [id number]  HPI  Alison Best is a 68 y.o.-year-old female, returning for f/u for subclinical thyrotoxicosis and MNG and also DM2, non-insulin -dependent, controlled, without complications. Last visit 1 year ago.    Interim history: At last visit she had abdominal pain, chronic after her abdominal surgery and diverticulitis.  She needs to have ventral hernia surgery by a surgeon at [facility].  However, she was told to quit smoking and improve her BMI before the surgery.   No palpitations, hot flashes, tremors.  She recently saw cardiology and she got a good report. She lost almost 36 pounds after starting Zepbound  - 09-07/2023.  She will need to lose more to qualify for the surgery (BMI ideally lower than 30).  Subclinical thyrotoxicosis: -Patient with mild Graves' disease.  We started methimazole  in 05/2015.   In 01/2021, we stopped methimazole .  Reviewed patient's TFTs: Lab Results  Component Value Date   TSH 0.93 02/10/2023   TSH 0.62 03/26/2022   TSH 0.84 01/25/2022   TSH 2.12 05/06/2021   TSH 1.71 04/02/2021   TSH 2.08 01/27/2021   TSH 3.770 06/26/2020   TSH 2.88 10/05/2019   TSH 2.26 03/23/2019   TSH 2.44 09/20/2018   FREET4 1.07 02/10/2023   FREET4 1.03 03/26/2022   FREET4 1.07 01/25/2022   FREET4 0.88 05/06/2021   FREET4 1.17 04/02/2021   FREET4 0.72 01/27/2021   FREET4 1.07 06/26/2020   FREET4 0.82 10/05/2019   FREET4 0.77 03/23/2019   FREET4 0.82 09/20/2018   Lab Results  Component Value Date   T3FREE 3.2 02/10/2023   T3FREE 3.2 03/26/2022   T3FREE 3.4 01/25/2022   T3FREE 4.6 (H) 05/06/2021   T3FREE 5.6 (H) 04/02/2021   T3FREE 2.8 01/27/2021   T3FREE 3.1 06/26/2020   T3FREE 3.0 10/05/2019   T3FREE 2.9 03/23/2019   T3FREE 2.9 09/20/2018    Lab Results  Component Value Date   TSI 111 06/18/2014   07/10/2014 Uptake and scan: The 24 hr uptake by the thyroid  gland is  28.5%. Normal 24 hr uptakeis 10-30 %. On thyroid  imaging, the thyroid  activity appears mildly heterogeneous in the right lobe with a possible cold nodule inferiorly. The left lobe activity appears homogeneous.  Thyroid  nodules:  -Diagnosed in 06/2014  Reviewedprevious ultrasound reports that showed several bilateral thyroid  nodules, with a right dominant 1.5 x 1.1 x 1.3 cm nodule.  We performed another thyroid  ultrasound on 10/24/2018, which showed dissolution of the previous thyroid  nodules.  The thyroid  gland appears heterogeneous (please see assessment section).  Pt denies: - feeling nodules in neck - hoarseness - dysphagia - choking  Prediabetes:  Reviewed HbA1c levels: Lab Results  Component Value Date   HGBA1C 6.7 (H) 05/04/2023   HGBA1C 6.9 (H) 02/10/2023   HGBA1C 5.5 03/26/2022   At last visit she was on: - Metformin  500 mg 2x a day with meals.   However, she is currently on: - Zepbound  7.5 >> 10 mg weekly (increased 3 weeks ago)  She is not checking sugars at home.  On 05/04/2023, glucose was 144, above target, on regular labs.  Kidney function was normal: Lab Results  Component Value Date   BUN 23 05/04/2023   BUN 22 03/26/2022   CREATININE 0.94 05/04/2023   CREATININE 0.86 03/26/2022   Lab Results  Component Value Date   MICRALBCREAT 0.3 05/28/2014   She has a history of hypertriglyceridemia: Lab Results  Component Value  Date   CHOL 104 05/04/2023   HDL 25.00 (L) 05/04/2023   LDLCALC 28 03/26/2022   LDLDIRECT 34.0 05/04/2023   TRIG 214.0 (H) 05/04/2023   CHOLHDL 4 05/04/2023  She is off Vascepa  (had GERD from this).  She is on Lipitor 20. She continues fenofibrate  134. CT scan from 07/2021 showed hepatic steatosis.  Last eye exam 06/01/2023: No DR.  She was previously seen in the weight management clinic and she started to lose weight but did not continue the program. She was on Topiramate  >> less carbohydrate cravings. She had colon resection in  summer 2016, after an episode of diverticulitis >> she is s/p colonostomy closure.  She also has a h/o vitamin D  deficiency: Lab Results  Component Value Date   VD25OH 52.22 05/04/2023   VD25OH 44.21 03/26/2022   VD25OH 29.99 (L) 01/25/2022   VD25OH 62.6 06/26/2020   VD25OH 59.61 10/05/2019   VD25OH 27.3 (L) 01/11/2019   On MVI - ~2000 units vit D.  At last visit I recommended to increase the dose to 3000 units daily. She is taking this now.  ROS: + see HPI + joint aches  I reviewed pt's medications, allergies, PMH, social hx, family hx, and changes were documented in the history of present illness. Otherwise, unchanged from my initial visit note.  Past Medical History:  Diagnosis Date   Anal fissure    Arthritis    Bursitis    Constipation    COPD (chronic obstructive pulmonary disease) (HCC)    Diverticulitis of colon 02/2015   Ectopic pregnancy    Emphysema of lung (HCC)    Frozen shoulder    Heart murmur    Hernia, abdominal    Hyperlipidemia    Hyperthyroidism    Infertility, female    Irregular heartbeat    Joint pain    Obesity    Osteoarthritis    Pneumonia    Pre-diabetes    Sleep apnea    Swallowing difficulty    Thyroid  disease    hyperthyroid   Past Surgical History:  Procedure Laterality Date   APPENDECTOMY     CESAREAN SECTION     COLONOSCOPY  05/27/2022   per Dr. Leonia Raman, no polyps, repeat 10 yrs   COLOSTOMY N/A 02/20/2015   Procedure: COLOSTOMY;  Surgeon: Adalberto Hollow, MD;  Location: [facility];  Service: General;  Laterality: N/A;   COLOSTOMY REVISION  02/20/2015   Procedure: Sigmoid Colectomy;  Surgeon: Adalberto Hollow, MD;  Location: [facility];  Service: General;;   CYSTOSCOPY N/A 02/20/2015   Procedure: Orin Birk;  Surgeon: Mark Ottelin, MD;  Location: [facility];  Service: Urology;  Laterality: N/A;   DILATION AND CURETTAGE OF UTERUS     x3   ECTOPIC PREGNANCY SURGERY     ILEO LOOP COLOSTOMY CLOSURE N/A 09/26/2015   Procedure: LAPAROSCOPIC  LYSIS OF ADHESIONS, COLOSTOMY CLOSURE;  Surgeon: Adalberto Hollow, MD;  Location: [facility];  Service: General;  Laterality: N/A;   LAPAROTOMY N/A 02/20/2015   Procedure: Emergency EXPLORATORY and drainiage of interabdominal abcesses;  Surgeon: Adalberto Hollow, MD;  Location: [facility];  Service: General;  Laterality: N/A;   SALPINGOOPHORECTOMY Left 02/20/2015   Procedure: SALPINGO OOPHORECTOMY;  Surgeon: Adalberto Hollow, MD;  Location: [facility];  Service: General;  Laterality: Left;   TONSILLECTOMY  1980   TOTAL HIP ARTHROPLASTY Left 02/23/2022   Procedure: TOTAL HIP ARTHROPLASTY ANTERIOR APPROACH;  Surgeon: Claiborne Crew, MD;  Location: [facility];  Service: Orthopedics;  Laterality: Left;   Social  History   Socioeconomic History   Marital status: Married    Spouse name: Not on file   Number of children: 1   Years of education: Not on file   Highest education level: Master's degree (e.g., MA, MS, MEng, MEd, MSW, MBA)  Occupational History   Occupation: Geneticist, 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[employer]    Employer: [employer]  Tobacco Use   Smoking status: Every Day    Current packs/day: 0.25    Average packs/day: 0.3 packs/day for 40.0 years (10.0 ttl pk-yrs)    Types: Cigarettes   Smokeless tobacco: Never  Vaping Use   Vaping status: Never Used  Substance and Sexual Activity   Alcohol use: Not Currently   Drug use: No   Sexual activity: Not Currently    Partners: Male    Birth control/protection: Post-menopausal    Comment: 1ST intercourse- 21, partners- 3  Other Topics Concern   Not on file  Social History Narrative   Not on file   Social Drivers of Health   Financial Resource Strain: Low Risk  (11/16/2023)   Overall Financial Resource Strain (CARDIA)    Difficulty of Paying Living Expenses: Not hard at all  Food Insecurity: No Food Insecurity (11/16/2023)   Hunger Vital Sign    Worried About Running Out of Food in the Last Year: Never true    Ran Out of Food in the Last  Year: Never true  Transportation Needs: No Transportation Needs (11/16/2023)   PRAPARE - Administrator, Civil Service (Medical): No    Lack of Transportation (Non-Medical): No  Physical Activity: Insufficiently Active (11/16/2023)   Exercise Vital Sign    Days of Exercise per Week: 1 day    Minutes of Exercise per Session: 10 min  Stress: No Stress Concern Present (11/16/2023)   [employer] of Occupational Health - Occupational Stress Questionnaire    Feeling of Stress : Only a little  Social Connections: Socially Integrated (11/16/2023)   Social Connection and Isolation Panel [NHANES]    Frequency of Communication with Friends and Family: Three times a week    Frequency of Social Gatherings with Friends and Family: Twice a week    Attends Religious Services: More than 4 times per year    Active Member of [employer] or Organizations: Yes    Attends Engineer, structural: More than 4 times per year    Marital Status: Married  Catering manager Violence: Not At Risk (09/19/2023)   Humiliation, Afraid, Rape, and Kick questionnaire    Fear of Current or Ex-Partner: No    Emotionally Abused: No    Physically Abused: No    Sexually Abused: No   Current Outpatient Medications on File Prior to Visit  Medication Sig Dispense Refill   acetaZOLAMIDE  ER (DIAMOX ) 500 MG capsule Take 1 capsule (500 mg total) by mouth 2 (two) times daily. When travelling at high altitudes 60 capsule 2   atorvastatin  (LIPITOR) 20 MG tablet TAKE 1 TABLET(20 MG) BY MOUTH DAILY 90 tablet 0   betamethasone  dipropionate 0.05 % cream Apply 1 application. topically 2 (two) times daily as needed (eczema). 45 g 5   celecoxib  (CELEBREX ) 200 MG capsule Take 1 capsule (200 mg total) by mouth 2 (two) times daily. 180 capsule 0   Coenzyme Q10 (CO Q 10 PO) Take 200 mg by mouth daily.     COLLAGEN PO Take 15 mLs by mouth daily. Liquid     diltiazem  (CARDIZEM  CD) 120 MG 24  hr capsule TAKE 1 CAPSULE(120 MG) BY MOUTH  DAILY 90 capsule 3   EPINEPHrine  0.3 mg/0.3 mL IJ SOAJ injection Inject 0.3 mg into the muscle as needed for anaphylaxis. 1 each 2   fenofibrate  micronized (LOFIBRA) 134 MG capsule TAKE 1 CAPSULE(134 MG) BY MOUTH DAILY 90 capsule 1   furosemide  (LASIX ) 20 MG tablet TAKE 1 TABLET(20 MG) BY MOUTH DAILY AS NEEDED FOR FLUID RETENTION OR SWELLING 30 tablet 2   ketoconazole  (NIZORAL ) 2 % cream Apply 1 application. topically 2 (two) times daily as needed for irritation (fungal infections). 30 g 5   MAGNESIUM  PO Take by mouth.     metFORMIN  (GLUCOPHAGE ) 500 MG tablet TAKE 1 TABLET(500 MG) BY MOUTH TWICE DAILY 180 tablet 3   Multiple Vitamins-Minerals (MULTIVITAMIN/EXTRA VITAMIN D3 PO) Take 1 tablet by mouth daily.     mupirocin  ointment (BACTROBAN ) 2 % Apply inside both nostrils twice a day 30 g 5   nicotine  (NICODERM CQ  - DOSED IN MG/24 HOURS) 14 mg/24hr patch RX #2 Weeks 5-6: 14 mg x 1 patch daily. Wear for 24 hours. If you have sleep disturbances, remove at bedtime. 42 each 0   nicotine  (NICODERM CQ  - DOSED IN MG/24 HOURS) 21 mg/24hr patch RX #1 Weeks 1-4: 21 mg x 1 patch daily. Wear for 24 hours. If you have sleep disturbances, remove at bedtime. 28 patch 0   nicotine  (NICODERM CQ  - DOSED IN MG/24 HR) 7 mg/24hr patch RX #3 Weeks 7-8: 7 mg x 1 patch daily. Wear for 24 hours. If you have sleep disturbances, remove at bedtime. 14 patch 0   OVER THE COUNTER MEDICATION Take 1 tablet by mouth daily. Saffron     OVER THE COUNTER MEDICATION Take 2 capsules by mouth daily. Memory and Brain     OVER THE COUNTER MEDICATION Take 1 capsule by mouth daily. Cruciferous extract     OVER THE COUNTER MEDICATION Take 1 capsule by mouth daily. fruit full antioxidant     polyethylene glycol (MIRALAX  / GLYCOLAX ) 17 g packet Take 17 g by mouth daily as needed for mild constipation. 14 each 0   tirzepatide  10 MG/0.5ML injection vial Inject 10 mg into the skin once a week. 2 mL 5   UNABLE TO FIND Med Name: vegetable blend      No current facility-administered medications on file prior to visit.   Allergies  Allergen Reactions   Levofloxacin  In D5w Other (See Comments)    Achilles tendon weakness   Mango Flavoring Agent (Non-Screening) Swelling    fruit   Bupropion  Nausea And Vomiting   Family History  Problem Relation Age of Onset   Breast cancer Mother    High blood pressure Mother    Kidney disease Mother    Sleep apnea Mother    Obesity Mother    CAD Father        CABG   Skin cancer Father    Lung cancer Father    Diverticulitis Father    Colon polyps Father    High blood pressure Father    Stroke Father    Heart disease Father    Sleep apnea Father    Allergies Brother    Diabetes Brother    Breast cancer Maternal Grandmother    Heart attack Maternal Grandfather    Heart attack Paternal Grandfather    COPD Other    Cancer Other    Heart disease Other    Stroke Other    PE: BP 120/70  Pulse 76   Ht 5' (1.524 m)   Wt 183 lb 3.2 oz (83.1 kg)   LMP 02/23/2012 Comment: not sexually active  SpO2 94%   BMI 35.78 kg/m   Wt Readings from Last 10 Encounters:  01/31/24 185 lb (83.9 kg)  11/17/23 193 lb (87.5 kg)  09/19/23 199 lb (90.3 kg)  06/15/23 217 lb (98.4 kg)  05/04/23 217 lb (98.4 kg)  04/06/23 216 lb (98 kg)  02/10/23 219 lb 3.2 oz (99.4 kg)  01/26/23 218 lb 3.2 oz (99 kg)  05/27/22 200 lb (90.7 kg)  04/05/22 200 lb 12.8 oz (91.1 kg)   Constitutional: overweight, in NAD Eyes:  EOMI, no exophthalmos ENT: no neck masses, no cervical lymphadenopathy Cardiovascular: RRR, No MRG  Respiratory: CTA B Musculoskeletal: no deformities Skin:no rashes Neurological: no tremor with outstretched hands Diabetic Foot Exam - Simple   Simple Foot Form Diabetic Foot exam was performed with the following findings: Yes 02/13/2024 10:03 AM  Visual Inspection No deformities, no ulcerations, no other skin breakdown bilaterally: Yes Sensation Testing Intact to touch and monofilament  testing bilaterally: Yes Pulse Check Posterior Tibialis and Dorsalis pulse intact bilaterally: Yes Comments + dry skin    ASSESSMENT: 1. Thyrotoxycosis, likely mild Graves' disease  07/10/2014: Uptake and scan: 24 hr uptake by the thyroid  gland is 28.5%. Normal 24 hr uptake is 10-30 %. On thyroid  imaging, the thyroid  activity appears mildly heterogeneous in the right lobe with a possible cold nodule inferiorly. The left lobe activity appears homogeneous.  2. Multinodular goiter  07/15/2014: Thyroid  U/S: Right thyroid  lobe: 4.5 x 1.9 x 1.9 cm. Multiple nodules are noted throughout the right lobe of the thyroid . There is diffuse irregular margined hypoechoic nodule measuring 16 mm in the upper pole. Multiple smaller nodules are identified. Left thyroid  lobe: 4.4 x 1.7 x 1.9 cm. Multiple hypoechoic nodules are noted within the left lobe of the thyroid . The largest of these measures 15 mm in greatest dimension lying within the lower pole Isthmus Thickness: 0.5 cm. No nodules visualized. Lymphadenopathy None visualized.  IMPRESSION: Multiple bilateral thyroid  nodules. The dominant nodules bilateral meet criteria for percutaneous biopsy.   10/20/2017: Thyroid  U/S: Location: Right; Inferior Maximum size: 1.5 cm; Other 2 dimensions: 1.1 cm x 1.3 cm Composition: solid/almost completely solid (2) Echogenicity: isoechoic (1)  IMPRESSION: Right inferior thyroid  nodule (labeled 1) meets criteria for surveillance, as designated by the newly established ACR TI-RADS criteria. Surveillance ultrasound study recommended to be performed annually up to 5 years. Given that the prior ultrasound was performed 2015, this may serve as the third annual.  10/24/2018: Thyroid  U/S: Parenchymal Echotexture: Moderately heterogenous Isthmus: 0.7 cm Right lobe: 5.2 x 2.2 x 2.3 cm Left lobe: 5.5 x 2.5 x 2.0 cm  No discrete nodules are seen within the thyroid  gland. Specifically, the 1.5 cm nodule previously  seen in the right inferior gland is no longer discernible from the background thyroid  parenchyma. It very likely represented a pseudo nodule.   IMPRESSION: 1. The previously identified right inferior thyroid  nodule can not be identified on today's examination. It is either involuted over the past year, or was representative of a pseudo nodule on the prior examination. No further follow-up required. 2. Similar appearance of enlarged and diffusely heterogeneous thyroid  gland.  3. DM2, controlled, non-insulin -dependent, without long-term complications but with hyperglycemia  4.  Vitamin D  deficiency - per PCP now  PLAN:  1. Patient with history of hyperthyroidism, initially with few thyrotoxic symptoms: Heat intolerance and  palpitations.  She does have a history of atrial fibrillation and she is on Cardizem , changed from metoprolol  due to dizziness. - A thyroid  uptake and scan showed mild Graves' disease versus mildly thyrotoxic nodules.  Of note, TSI's were not elevated in the past.  We started methimazole  in 05/2015 and was finally able to stop in 01/2021. - At last visit, her TFTs were normal off methimazole  - We will check her TFTs today to see if we need to restart methimazole  back - I will have her come back in 1 year but possibly sooner for labs  2. Multinodular goiter - Reviewed the report of the latest ultrasound from 10/24/2018: Her thyroid  nodules were not visible on the new ultrasound, consistent with previous pseudonodule (sites of inflammation), which resolved - She did not need compression symptoms - No imaging follow-up necessary for this, we will continue to follow her clinically  3. DM2 -At last visit she was on metformin  ER, but now also on Zepbound  - per PCP, for which a PA was approved.  She is on 10 mg weekly, dose increased 3 weeks ago.  -Latest HbA1c was slightly better, decreased from 6.9% at last visit to 6.7% 3 months later: Lab Results  Component Value Date    HGBA1C 6.7 (H) 05/04/2023  - Before last visit she gained 11 pounds back since the previous visit, but since last visit, she lost 36 pounds - Will check her HbA1c today - I will see her back in a year  4. Vit D def - on 3000 units vitamin D  daily - last level was normal - will have it repeated in 04/2024  Orders Placed This Encounter  Procedures   TSH   T4, free   T3, free   Microalbumin / creatinine urine ratio   Hemoglobin A1c   Component     Latest Ref Rng 02/13/2024  T4,Free(Direct)     0.8 - 1.8 ng/dL 1.3   Triiodothyronine,Free,Serum     2.3 - 4.2 pg/mL 3.0   TSH     0.40 - 4.50 mIU/L 0.54   Hemoglobin A1C     <5.7 % 5.4   Creatinine, Urine     20 - 275 mg/dL 29   Microalb, Ur     mg/dL <1.6   MICROALB/CREAT RATIO     <30 mg/g creat NOTE   Mean Plasma Glucose     mg/dL 109   eAG (mmol/L)     mmol/L 6.0   All labs are normal, with a significantly improved HbA1c.  Emilie Harden, MD PhD [facility]

## 2024-02-13 NOTE — Patient Instructions (Signed)
 Please continue: - Metformin  500 mg 2x a day - Zepbound  10 mg weekly  Please stop at the lab.  Please come back for a follow-up appointment in 1 year.

## 2024-02-14 ENCOUNTER — Other Ambulatory Visit: Payer: Self-pay | Admitting: Family Medicine

## 2024-02-14 ENCOUNTER — Encounter: Payer: Self-pay | Admitting: Internal Medicine

## 2024-02-14 LAB — MICROALBUMIN / CREATININE URINE RATIO
Creatinine, Urine: 29 mg/dL (ref 20–275)
Microalb, Ur: <0.2 mg/dL

## 2024-02-14 LAB — HEMOGLOBIN A1C
Hgb A1c MFr Bld: 5.4 % (ref <5.7)
Mean Plasma Glucose: 108 mg/dL
eAG (mmol/L): 6 mmol/L

## 2024-02-14 LAB — T3, FREE: T3, Free: 3 pg/mL (ref 2.3–4.2)

## 2024-02-14 LAB — TSH: TSH: 0.54 m[IU]/L (ref 0.40–4.50)

## 2024-02-14 LAB — T4, FREE: Free T4: 1.3 ng/dL (ref 0.8–1.8)

## 2024-02-26 DIAGNOSIS — S31104A Unspecified open wound of abdominal wall, left lower quadrant without penetration into peritoneal cavity, initial encounter: Secondary | ICD-10-CM | POA: Diagnosis not present

## 2024-02-27 DIAGNOSIS — S31104A Unspecified open wound of abdominal wall, left lower quadrant without penetration into peritoneal cavity, initial encounter: Secondary | ICD-10-CM | POA: Diagnosis not present

## 2024-02-27 DIAGNOSIS — F17210 Nicotine dependence, cigarettes, uncomplicated: Secondary | ICD-10-CM | POA: Diagnosis not present

## 2024-02-27 DIAGNOSIS — L98492 Non-pressure chronic ulcer of skin of other sites with fat layer exposed: Secondary | ICD-10-CM | POA: Diagnosis not present

## 2024-02-27 DIAGNOSIS — S31104D Unspecified open wound of abdominal wall, left lower quadrant without penetration into peritoneal cavity, subsequent encounter: Secondary | ICD-10-CM | POA: Diagnosis not present

## 2024-03-01 DIAGNOSIS — S31109A Unspecified open wound of abdominal wall, unspecified quadrant without penetration into peritoneal cavity, initial encounter: Secondary | ICD-10-CM | POA: Diagnosis not present

## 2024-03-05 DIAGNOSIS — L98492 Non-pressure chronic ulcer of skin of other sites with fat layer exposed: Secondary | ICD-10-CM | POA: Diagnosis not present

## 2024-03-05 DIAGNOSIS — F17210 Nicotine dependence, cigarettes, uncomplicated: Secondary | ICD-10-CM | POA: Diagnosis not present

## 2024-03-05 DIAGNOSIS — S31109A Unspecified open wound of abdominal wall, unspecified quadrant without penetration into peritoneal cavity, initial encounter: Secondary | ICD-10-CM | POA: Diagnosis not present

## 2024-03-05 DIAGNOSIS — S31104D Unspecified open wound of abdominal wall, left lower quadrant without penetration into peritoneal cavity, subsequent encounter: Secondary | ICD-10-CM | POA: Diagnosis not present

## 2024-03-06 DIAGNOSIS — S31109A Unspecified open wound of abdominal wall, unspecified quadrant without penetration into peritoneal cavity, initial encounter: Secondary | ICD-10-CM | POA: Diagnosis not present

## 2024-03-12 DIAGNOSIS — S31104D Unspecified open wound of abdominal wall, left lower quadrant without penetration into peritoneal cavity, subsequent encounter: Secondary | ICD-10-CM | POA: Diagnosis not present

## 2024-03-12 DIAGNOSIS — L98492 Non-pressure chronic ulcer of skin of other sites with fat layer exposed: Secondary | ICD-10-CM | POA: Diagnosis not present

## 2024-03-12 DIAGNOSIS — F17210 Nicotine dependence, cigarettes, uncomplicated: Secondary | ICD-10-CM | POA: Diagnosis not present

## 2024-03-19 ENCOUNTER — Encounter: Payer: Self-pay | Admitting: Family Medicine

## 2024-03-20 MED ORDER — ZEPBOUND 12.5 MG/0.5ML ~~LOC~~ SOAJ: 12.5000 mg | SUBCUTANEOUS | 11 refills | Status: DC

## 2024-03-20 NOTE — Telephone Encounter (Signed)
 I sent in for 12.5 mg Zepbound  shots. She does not require a referral to see a GYN for a routine exam

## 2024-03-26 DIAGNOSIS — S31104A Unspecified open wound of abdominal wall, left lower quadrant without penetration into peritoneal cavity, initial encounter: Secondary | ICD-10-CM | POA: Diagnosis not present

## 2024-03-26 DIAGNOSIS — L98492 Non-pressure chronic ulcer of skin of other sites with fat layer exposed: Secondary | ICD-10-CM | POA: Diagnosis not present

## 2024-03-26 DIAGNOSIS — J449 Chronic obstructive pulmonary disease, unspecified: Secondary | ICD-10-CM | POA: Diagnosis not present

## 2024-03-26 DIAGNOSIS — F17210 Nicotine dependence, cigarettes, uncomplicated: Secondary | ICD-10-CM | POA: Diagnosis not present

## 2024-03-26 DIAGNOSIS — E785 Hyperlipidemia, unspecified: Secondary | ICD-10-CM | POA: Diagnosis not present

## 2024-03-26 DIAGNOSIS — Z79899 Other long term (current) drug therapy: Secondary | ICD-10-CM | POA: Diagnosis not present

## 2024-03-26 DIAGNOSIS — L989 Disorder of the skin and subcutaneous tissue, unspecified: Secondary | ICD-10-CM | POA: Diagnosis not present

## 2024-03-26 DIAGNOSIS — Z7984 Long term (current) use of oral hypoglycemic drugs: Secondary | ICD-10-CM | POA: Diagnosis not present

## 2024-03-26 DIAGNOSIS — R7303 Prediabetes: Secondary | ICD-10-CM | POA: Diagnosis not present

## 2024-04-02 DIAGNOSIS — R7303 Prediabetes: Secondary | ICD-10-CM | POA: Diagnosis not present

## 2024-04-02 DIAGNOSIS — Z79899 Other long term (current) drug therapy: Secondary | ICD-10-CM | POA: Diagnosis not present

## 2024-04-02 DIAGNOSIS — F17210 Nicotine dependence, cigarettes, uncomplicated: Secondary | ICD-10-CM | POA: Diagnosis not present

## 2024-04-02 DIAGNOSIS — E785 Hyperlipidemia, unspecified: Secondary | ICD-10-CM | POA: Diagnosis not present

## 2024-04-02 DIAGNOSIS — J449 Chronic obstructive pulmonary disease, unspecified: Secondary | ICD-10-CM | POA: Diagnosis not present

## 2024-04-02 DIAGNOSIS — L98492 Non-pressure chronic ulcer of skin of other sites with fat layer exposed: Secondary | ICD-10-CM | POA: Diagnosis not present

## 2024-04-02 DIAGNOSIS — Z7984 Long term (current) use of oral hypoglycemic drugs: Secondary | ICD-10-CM | POA: Diagnosis not present

## 2024-04-02 DIAGNOSIS — S31104A Unspecified open wound of abdominal wall, left lower quadrant without penetration into peritoneal cavity, initial encounter: Secondary | ICD-10-CM | POA: Diagnosis not present

## 2024-04-02 DIAGNOSIS — L989 Disorder of the skin and subcutaneous tissue, unspecified: Secondary | ICD-10-CM | POA: Diagnosis not present

## 2024-04-03 DIAGNOSIS — S31109A Unspecified open wound of abdominal wall, unspecified quadrant without penetration into peritoneal cavity, initial encounter: Secondary | ICD-10-CM | POA: Diagnosis not present

## 2024-04-16 DIAGNOSIS — L989 Disorder of the skin and subcutaneous tissue, unspecified: Secondary | ICD-10-CM | POA: Diagnosis not present

## 2024-04-16 DIAGNOSIS — R7303 Prediabetes: Secondary | ICD-10-CM | POA: Diagnosis not present

## 2024-04-16 DIAGNOSIS — Z79899 Other long term (current) drug therapy: Secondary | ICD-10-CM | POA: Diagnosis not present

## 2024-04-16 DIAGNOSIS — F17210 Nicotine dependence, cigarettes, uncomplicated: Secondary | ICD-10-CM | POA: Diagnosis not present

## 2024-04-16 DIAGNOSIS — S31104A Unspecified open wound of abdominal wall, left lower quadrant without penetration into peritoneal cavity, initial encounter: Secondary | ICD-10-CM | POA: Diagnosis not present

## 2024-04-16 DIAGNOSIS — L98492 Non-pressure chronic ulcer of skin of other sites with fat layer exposed: Secondary | ICD-10-CM | POA: Diagnosis not present

## 2024-04-16 DIAGNOSIS — E785 Hyperlipidemia, unspecified: Secondary | ICD-10-CM | POA: Diagnosis not present

## 2024-04-16 DIAGNOSIS — Z7984 Long term (current) use of oral hypoglycemic drugs: Secondary | ICD-10-CM | POA: Diagnosis not present

## 2024-04-16 DIAGNOSIS — J449 Chronic obstructive pulmonary disease, unspecified: Secondary | ICD-10-CM | POA: Diagnosis not present

## 2024-04-19 ENCOUNTER — Other Ambulatory Visit: Payer: Self-pay | Admitting: Internal Medicine

## 2024-04-19 ENCOUNTER — Other Ambulatory Visit: Payer: Self-pay | Admitting: Family Medicine

## 2024-04-19 DIAGNOSIS — Z1231 Encounter for screening mammogram for malignant neoplasm of breast: Secondary | ICD-10-CM

## 2024-04-26 ENCOUNTER — Other Ambulatory Visit: Payer: Self-pay | Admitting: Family Medicine

## 2024-05-02 DIAGNOSIS — D2271 Melanocytic nevi of right lower limb, including hip: Secondary | ICD-10-CM | POA: Diagnosis not present

## 2024-05-02 DIAGNOSIS — D225 Melanocytic nevi of trunk: Secondary | ICD-10-CM | POA: Diagnosis not present

## 2024-05-02 DIAGNOSIS — L812 Freckles: Secondary | ICD-10-CM | POA: Diagnosis not present

## 2024-05-02 DIAGNOSIS — L720 Epidermal cyst: Secondary | ICD-10-CM | POA: Diagnosis not present

## 2024-05-02 DIAGNOSIS — D2272 Melanocytic nevi of left lower limb, including hip: Secondary | ICD-10-CM | POA: Diagnosis not present

## 2024-05-02 DIAGNOSIS — D22111 Melanocytic nevi of right upper eyelid, including canthus: Secondary | ICD-10-CM | POA: Diagnosis not present

## 2024-05-02 DIAGNOSIS — L738 Other specified follicular disorders: Secondary | ICD-10-CM | POA: Diagnosis not present

## 2024-05-03 DIAGNOSIS — S31109A Unspecified open wound of abdominal wall, unspecified quadrant without penetration into peritoneal cavity, initial encounter: Secondary | ICD-10-CM | POA: Diagnosis not present

## 2024-05-04 ENCOUNTER — Ambulatory Visit (INDEPENDENT_AMBULATORY_CARE_PROVIDER_SITE_OTHER): Payer: Medicare PPO | Admitting: Family Medicine

## 2024-05-04 ENCOUNTER — Encounter: Payer: Self-pay | Admitting: Family Medicine

## 2024-05-04 VITALS — BP 118/72 | HR 81 | Temp 97.8°F | Ht 60.0 in | Wt 170.4 lb

## 2024-05-04 DIAGNOSIS — G4733 Obstructive sleep apnea (adult) (pediatric): Secondary | ICD-10-CM

## 2024-05-04 DIAGNOSIS — S31109A Unspecified open wound of abdominal wall, unspecified quadrant without penetration into peritoneal cavity, initial encounter: Secondary | ICD-10-CM | POA: Diagnosis not present

## 2024-05-04 DIAGNOSIS — E785 Hyperlipidemia, unspecified: Secondary | ICD-10-CM | POA: Diagnosis not present

## 2024-05-04 DIAGNOSIS — R7303 Prediabetes: Secondary | ICD-10-CM | POA: Diagnosis not present

## 2024-05-04 DIAGNOSIS — E559 Vitamin D deficiency, unspecified: Secondary | ICD-10-CM | POA: Diagnosis not present

## 2024-05-04 DIAGNOSIS — Z6841 Body Mass Index (BMI) 40.0 and over, adult: Secondary | ICD-10-CM

## 2024-05-04 DIAGNOSIS — F4321 Adjustment disorder with depressed mood: Secondary | ICD-10-CM

## 2024-05-04 DIAGNOSIS — L719 Rosacea, unspecified: Secondary | ICD-10-CM

## 2024-05-04 DIAGNOSIS — M150 Primary generalized (osteo)arthritis: Secondary | ICD-10-CM

## 2024-05-04 DIAGNOSIS — E042 Nontoxic multinodular goiter: Secondary | ICD-10-CM

## 2024-05-04 DIAGNOSIS — L409 Psoriasis, unspecified: Secondary | ICD-10-CM

## 2024-05-04 DIAGNOSIS — J4520 Mild intermittent asthma, uncomplicated: Secondary | ICD-10-CM | POA: Diagnosis not present

## 2024-05-04 DIAGNOSIS — E66813 Obesity, class 3: Secondary | ICD-10-CM

## 2024-05-04 DIAGNOSIS — Z209 Contact with and (suspected) exposure to unspecified communicable disease: Secondary | ICD-10-CM

## 2024-05-04 DIAGNOSIS — Z1159 Encounter for screening for other viral diseases: Secondary | ICD-10-CM | POA: Diagnosis not present

## 2024-05-04 LAB — CBC WITH DIFFERENTIAL/PLATELET
Basophils Absolute: 0.1 K/uL (ref 0.0–0.1)
Basophils Relative: 0.7 % (ref 0.0–3.0)
Eosinophils Absolute: 0.2 K/uL (ref 0.0–0.7)
Eosinophils Relative: 2.1 % (ref 0.0–5.0)
HCT: 36.9 % (ref 36.0–46.0)
Hemoglobin: 12.4 g/dL (ref 12.0–15.0)
Lymphocytes Relative: 29.4 % (ref 12.0–46.0)
Lymphs Abs: 2.2 K/uL (ref 0.7–4.0)
MCHC: 33.6 g/dL (ref 30.0–36.0)
MCV: 91.8 fl (ref 78.0–100.0)
Monocytes Absolute: 0.6 K/uL (ref 0.1–1.0)
Monocytes Relative: 7.5 % (ref 3.0–12.0)
Neutro Abs: 4.4 K/uL (ref 1.4–7.7)
Neutrophils Relative %: 60.3 % (ref 43.0–77.0)
Platelets: 352 K/uL (ref 150.0–400.0)
RBC: 4.02 Mil/uL (ref 3.87–5.11)
RDW: 14.2 % (ref 11.5–15.5)
WBC: 7.4 K/uL (ref 4.0–10.5)

## 2024-05-04 LAB — BASIC METABOLIC PANEL WITH GFR
BUN: 21 mg/dL (ref 6–23)
CO2: 29 meq/L (ref 19–32)
Calcium: 9.3 mg/dL (ref 8.4–10.5)
Chloride: 108 meq/L (ref 96–112)
Creatinine, Ser: 0.87 mg/dL (ref 0.40–1.20)
GFR: 69.28 mL/min (ref >60.00)
Glucose, Bld: 84 mg/dL (ref 70–99)
Potassium: 4.6 meq/L (ref 3.5–5.1)
Sodium: 141 meq/L (ref 135–145)

## 2024-05-04 LAB — LIPID PANEL
Cholesterol: 87 mg/dL (ref 0–200)
HDL: 31.8 mg/dL — ABNORMAL LOW (ref >39.00)
LDL Cholesterol: 27 mg/dL (ref 0–99)
NonHDL: 55.07
Total CHOL/HDL Ratio: 3
Triglycerides: 139 mg/dL (ref 0.0–149.0)
VLDL: 27.8 mg/dL (ref 0.0–40.0)

## 2024-05-04 LAB — HEPATIC FUNCTION PANEL
ALT: 13 U/L (ref 0–35)
AST: 16 U/L (ref 0–37)
Albumin: 4 g/dL (ref 3.5–5.2)
Alkaline Phosphatase: 41 U/L (ref 39–117)
Bilirubin, Direct: 0.1 mg/dL (ref 0.0–0.3)
Total Bilirubin: 0.4 mg/dL (ref 0.2–1.2)
Total Protein: 6.4 g/dL (ref 6.0–8.3)

## 2024-05-04 LAB — VITAMIN D 25 HYDROXY (VIT D DEFICIENCY, FRACTURES): VITD: 50.71 ng/mL (ref 30.00–100.00)

## 2024-05-04 MED ORDER — BETAMETHASONE DIPROPIONATE 0.05 % EX CREA: 1.0000 | TOPICAL_CREAM | Freq: Two times a day (BID) | CUTANEOUS | 5 refills | Status: AC | PRN

## 2024-05-04 MED ORDER — ZEPBOUND 15 MG/0.5ML ~~LOC~~ SOAJ: 15.0000 mg | SUBCUTANEOUS | 3 refills | Status: DC

## 2024-05-04 MED ORDER — MUPIROCIN 2 % EX OINT: TOPICAL_OINTMENT | CUTANEOUS | 5 refills | Status: AC

## 2024-05-04 NOTE — Progress Notes (Signed)
 Subjective:    Patient ID: Alison Best, female    DOB: 1956/05/27, 68 y.o.   MRN: [id number]  HPI Here to follow up on issues. Overall she is doing well. She continues to use Zepbound , and she has lost 49 lbs ince April 2024. She is seeing the [facility] in [facility] for an ulcer on the abdomen, and this is slowly healing. She sees Dr. Trixie who follows her thyroid  levels and A1c. Her thyroid  levels were all normal on 02-13-24, and the A1c was 5.4%. Her asthma and OA are stable.    Review of Systems  Constitutional: Negative.   HENT: Negative.    Eyes: Negative.   Respiratory: Negative.    Cardiovascular: Negative.   Gastrointestinal: Negative.   Genitourinary:  Negative for decreased urine volume, difficulty urinating, dyspareunia, dysuria, enuresis, flank pain, frequency, hematuria, pelvic pain and urgency.  Musculoskeletal:  Positive for arthralgias.  Skin: Negative.   Neurological: Negative.  Negative for headaches.  Psychiatric/Behavioral: Negative.         Objective:   Physical Exam Constitutional:      General: She is not in acute distress.    Appearance: She is well-developed. She is obese.  HENT:     Head: Normocephalic and atraumatic.     Right Ear: External ear normal.     Left Ear: External ear normal.     Nose: Nose normal.     Mouth/Throat:     Pharynx: No oropharyngeal exudate.  Eyes:     General: No scleral icterus.    Conjunctiva/sclera: Conjunctivae normal.     Pupils: Pupils are equal, round, and reactive to light.  Neck:     Thyroid : No thyromegaly.     Vascular: No JVD.  Cardiovascular:     Rate and Rhythm: Normal rate and regular rhythm.     Pulses: Normal pulses.     Heart sounds: Normal heart sounds. No murmur heard.    No friction rub. No gallop.  Pulmonary:     Effort: Pulmonary effort is normal. No respiratory distress.     Breath sounds: Normal breath sounds. No wheezing or rales.  Chest:     Chest wall: No  tenderness.  Abdominal:     General: Bowel sounds are normal. There is no distension.     Palpations: Abdomen is soft. There is no mass.     Tenderness: There is no abdominal tenderness. There is no guarding or rebound.  Musculoskeletal:        General: No tenderness. Normal range of motion.     Cervical back: Normal range of motion and neck supple.  Lymphadenopathy:     Cervical: No cervical adenopathy.  Skin:    General: Skin is warm and dry.     Findings: No erythema or rash.  Neurological:     General: No focal deficit present.     Mental Status: She is alert and oriented to person, place, and time.     Cranial Nerves: No cranial nerve deficit.     Motor: No abnormal muscle tone.     Coordination: Coordination normal.     Deep Tendon Reflexes: Reflexes are normal and symmetric. Reflexes normal.  Psychiatric:        Mood and Affect: Mood normal.        Behavior: Behavior normal.        Thought Content: Thought content normal.        Judgment: Judgment normal.  Assessment & Plan:  She is steadily losing weight by using Zepbound , and we will increase the dose to 15 mg weekly. Her asthma and OA are stable. She will follow up with the wound clinic for the ulcer. Get labs today for lipids, etc. Her thyroid  function is normal. Her prediabetes has disappeared now that she has lost the weight.We spent a total of ( 35  ) minutes reviewing records and discussing these issues.  Garnette Olmsted, MD

## 2024-05-05 LAB — HEPATITIS C ANTIBODY: Hepatitis C Ab: NONREACTIVE

## 2024-05-08 DIAGNOSIS — J449 Chronic obstructive pulmonary disease, unspecified: Secondary | ICD-10-CM | POA: Diagnosis not present

## 2024-05-08 DIAGNOSIS — Z7984 Long term (current) use of oral hypoglycemic drugs: Secondary | ICD-10-CM | POA: Diagnosis not present

## 2024-05-08 DIAGNOSIS — E785 Hyperlipidemia, unspecified: Secondary | ICD-10-CM | POA: Diagnosis not present

## 2024-05-08 DIAGNOSIS — S31104A Unspecified open wound of abdominal wall, left lower quadrant without penetration into peritoneal cavity, initial encounter: Secondary | ICD-10-CM | POA: Diagnosis not present

## 2024-05-08 DIAGNOSIS — Z79899 Other long term (current) drug therapy: Secondary | ICD-10-CM | POA: Diagnosis not present

## 2024-05-08 DIAGNOSIS — L98492 Non-pressure chronic ulcer of skin of other sites with fat layer exposed: Secondary | ICD-10-CM | POA: Diagnosis not present

## 2024-05-08 DIAGNOSIS — F17210 Nicotine dependence, cigarettes, uncomplicated: Secondary | ICD-10-CM | POA: Diagnosis not present

## 2024-05-09 ENCOUNTER — Ambulatory Visit: Payer: Self-pay | Admitting: Family Medicine

## 2024-05-17 ENCOUNTER — Ambulatory Visit: Admitting: Obstetrics and Gynecology

## 2024-05-17 ENCOUNTER — Encounter: Payer: Self-pay | Admitting: Obstetrics and Gynecology

## 2024-05-17 ENCOUNTER — Ambulatory Visit
Admission: RE | Admit: 2024-05-17 | Discharge: 2024-05-17 | Disposition: A | Discharge status: Home / Self Care | Source: Ambulatory Visit | Attending: Family Medicine | Admitting: Family Medicine

## 2024-05-17 ENCOUNTER — Other Ambulatory Visit (HOSPITAL_COMMUNITY)
Admission: RE | Admit: 2024-05-17 | Discharge: 2024-05-17 | Disposition: A | Discharge status: Home / Self Care | Source: Ambulatory Visit | Attending: Obstetrics and Gynecology | Admitting: Obstetrics and Gynecology

## 2024-05-17 VITALS — BP 122/62 | HR 72 | Temp 98.5°F | Ht 60.25 in | Wt 170.0 lb

## 2024-05-17 DIAGNOSIS — Z803 Family history of malignant neoplasm of breast: Secondary | ICD-10-CM

## 2024-05-17 DIAGNOSIS — Z01419 Encounter for gynecological examination (general) (routine) without abnormal findings: Secondary | ICD-10-CM | POA: Diagnosis not present

## 2024-05-17 DIAGNOSIS — Z1231 Encounter for screening mammogram for malignant neoplasm of breast: Secondary | ICD-10-CM

## 2024-05-17 DIAGNOSIS — Z124 Encounter for screening for malignant neoplasm of cervix: Secondary | ICD-10-CM

## 2024-05-17 DIAGNOSIS — Z9189 Other specified personal risk factors, not elsewhere classified: Secondary | ICD-10-CM | POA: Diagnosis not present

## 2024-05-17 DIAGNOSIS — Z1239 Encounter for other screening for malignant neoplasm of breast: Secondary | ICD-10-CM | POA: Diagnosis not present

## 2024-05-17 DIAGNOSIS — Z1151 Encounter for screening for human papillomavirus (HPV): Secondary | ICD-10-CM | POA: Diagnosis not present

## 2024-05-17 DIAGNOSIS — N841 Polyp of cervix uteri: Secondary | ICD-10-CM | POA: Diagnosis not present

## 2024-05-17 DIAGNOSIS — Z1331 Encounter for screening for depression: Secondary | ICD-10-CM | POA: Diagnosis not present

## 2024-05-17 NOTE — Progress Notes (Signed)
 68 y.o. G2P1011 postmenopausal female with fam hx of breast cancer (09/2023 neg genetic testing, increased 41yr breast cancer risk), LSO (2016 with gen surg due to bowel perf) here for annual exam. Married.  She reports recent genetic testing and would like to discuss mammogram reports. GCG-DES neg-medicare (low risk) Fam hx of breast cancer with negative genetic testing Gail risk 4.9&17%, 55yr and lifetime risk.  Had a bowel perforation secondary to Diverticulitis with Bowel resection, had colostomy. Following many surgeries, developed a large Left Abdominal Hernia. Needs to loose weight so repair can be scheduled. Following with wound management  Urine sample provided: No  Postmenopausal bleeding: none Pelvic discharge or pain: none Breast mass, nipple discharge or skin changes : none Sexually active: No   Last PAP: 12/17/20 Last mammogram: 05/17/24 pending result Last DXA: 10/07/20 normal Last colonoscopy: 05/27/22 10 year recall  Exercising: stretches, minimal walking Smoker:No  Flowsheet Row Office Visit from 05/17/2024 in [facility]  PHQ-2 Total Score 0    Flowsheet Row Office Visit from 05/04/2023 in [facility] [place]  PHQ-9 Total Score 3     GYN HISTORY: As noted  OB History  Gravida Para Term Preterm AB Living  2 1 1  1 1   SAB IAB Ectopic Multiple Live Births    1  1    # Outcome Date GA Lbr Len/2nd Weight Sex Type Anes PTL Lv  2 Ectopic           1 Term      CS-Unspec   LIV   Past Medical History:  Diagnosis Date   Anal fissure    Arthritis    Bursitis    Constipation    COPD (chronic obstructive pulmonary disease) (HCC)    Diverticulitis of colon 02/2015   Ectopic pregnancy    Emphysema of lung (HCC)    Frozen shoulder    Heart murmur    Hernia, abdominal    Hyperlipidemia    Hyperthyroidism    Infertility, female    Irregular heartbeat    Joint pain    Obesity    Osteoarthritis     Pneumonia    Pre-diabetes    Sleep apnea    Swallowing difficulty    Thyroid  disease    hyperthyroid   Past Surgical History:  Procedure Laterality Date   APPENDECTOMY     CESAREAN SECTION     COLONOSCOPY  05/27/2022   per Dr. Shila, no polyps, repeat 10 yrs   COLOSTOMY N/A 02/20/2015   Procedure: COLOSTOMY;  Surgeon: Krystal Russell, MD;  Location: [facility];  Service: General;  Laterality: N/A;   COLOSTOMY REVISION  02/20/2015   Procedure: Sigmoid Colectomy;  Surgeon: Krystal Russell, MD;  Location: [facility];  Service: General;;   CYSTOSCOPY N/A 02/20/2015   Procedure: PHYLLIS;  Surgeon: Mark Ottelin, MD;  Location: [facility];  Service: Urology;  Laterality: N/A;   DILATION AND CURETTAGE OF UTERUS     x3   ECTOPIC PREGNANCY SURGERY     ILEO LOOP COLOSTOMY CLOSURE N/A 09/26/2015   Procedure: LAPAROSCOPIC LYSIS OF ADHESIONS, COLOSTOMY CLOSURE;  Surgeon: Krystal Russell, MD;  [facility]: [facility];  Service: General;  Laterality: N/A;   LAPAROTOMY N/A 02/20/2015   Procedure: Emergency EXPLORATORY and drainiage of interabdominal abcesses;  Surgeon: Krystal Russell, MD;  [facility]: [facility];  Service: General;  Laterality: N/A;   SALPINGOOPHORECTOMY Left 02/20/2015   Procedure: SALPINGO OOPHORECTOMY;  Surgeon: Krystal Russell, MD;  [facility]: [facility];  Service: General;  Laterality: Left;   TONSILLECTOMY  1980   TOTAL HIP ARTHROPLASTY Left 02/23/2022   Procedure: TOTAL HIP ARTHROPLASTY ANTERIOR APPROACH;  Surgeon: Ernie Cough, MD;  Location: [facility];  Service: Orthopedics;  Laterality: Left;   Current Outpatient Medications on File Prior to Visit  Medication Sig Dispense Refill   Accu-Chek Softclix Lancets lancets Use to check blood sugar 1 x a day 100 each 3   acetaZOLAMIDE  ER (DIAMOX ) 500 MG capsule Take 1 capsule (500 mg total) by mouth 2 (two) times daily. When travelling at high altitudes 60 capsule 2   atorvastatin  (LIPITOR) 20 MG tablet TAKE 1 TABLET(20 MG) BY MOUTH DAILY 90 tablet 2    betamethasone  dipropionate 0.05 % cream Apply 1 application  topically 2 (two) times daily as needed (eczema). 45 g 5   Blood Glucose Monitoring Suppl (ACCU-CHEK GUIDE) w/Device KIT Use to check blood sugar 1 x a day 1 kit 0   Celecoxib  (CELEBREX  PO) Take by mouth daily.     Coenzyme Q10 (CO Q 10 PO) Take 200 mg by mouth daily.     COLLAGEN PO Take 15 mLs by mouth daily. Liquid     diltiazem  (CARDIZEM  CD) 120 MG 24 hr capsule TAKE 1 CAPSULE(120 MG) BY MOUTH DAILY 90 capsule 3   EPINEPHrine  0.3 mg/0.3 mL IJ SOAJ injection Inject 0.3 mg into the muscle as needed for anaphylaxis. 1 each 2   fenofibrate  (TRICOR ) 145 MG tablet 1 tablet Orally Once a day; Duration: 30 day(s)     fenofibrate  micronized (LOFIBRA) 134 MG capsule TAKE 1 CAPSULE(134 MG) BY MOUTH DAILY 90 capsule 1   furosemide  (LASIX ) 20 MG tablet TAKE 1 TABLET(20 MG) BY MOUTH DAILY AS NEEDED FOR FLUID RETENTION OR SWELLING 30 tablet 2   glucose blood (ACCU-CHEK GUIDE TEST) test strip Use to check blood sugar 1 x a day 100 each 12   MAGNESIUM  PO Take by mouth.     Multiple Vitamins-Minerals (MULTIVITAMIN/EXTRA VITAMIN D3 PO) Take 1 tablet by mouth daily.     mupirocin  ointment (BACTROBAN ) 2 % Apply inside both nostrils twice a day 30 g 5   nicotine  (NICODERM CQ  - DOSED IN MG/24 HOURS) 14 mg/24hr patch RX #2 Weeks 5-6: 14 mg x 1 patch daily. Wear for 24 hours. If you have sleep disturbances, remove at bedtime. 42 each 0   nicotine  (NICODERM CQ  - DOSED IN MG/24 HOURS) 21 mg/24hr patch RX #1 Weeks 1-4: 21 mg x 1 patch daily. Wear for 24 hours. If you have sleep disturbances, remove at bedtime. 28 patch 0   nicotine  (NICODERM CQ  - DOSED IN MG/24 HR) 7 mg/24hr patch RX #3 Weeks 7-8: 7 mg x 1 patch daily. Wear for 24 hours. If you have sleep disturbances, remove at bedtime. 14 patch 0   OVER THE COUNTER MEDICATION Take 1 tablet by mouth daily. Saffron     OVER THE COUNTER MEDICATION Take 2 capsules by mouth daily. Memory and Brain     OVER THE  COUNTER MEDICATION Take 1 capsule by mouth daily. Cruciferous extract     OVER THE COUNTER MEDICATION Take 1 capsule by mouth daily. fruit full antioxidant     polyethylene glycol (MIRALAX  / GLYCOLAX ) 17 g packet Take 17 g by mouth daily as needed for mild constipation. 14 each 0   tirzepatide  (ZEPBOUND ) 15 MG/0.5ML Pen Inject 15 mg into the skin once a week. 4 mL 3   UNABLE TO FIND  Med Name: vegetable blend (Patient not taking: Reported on 05/17/2024)     No current facility-administered medications on file prior to visit.   Social History   Socioeconomic History   Marital status: Married    Spouse name: Not on file   Number of children: 1   Years of education: Not on file   Highest education level: Master's degree (e.g., MA, MS, MEng, MEd, MSW, MBA)  Occupational History   Occupation: Geneticist, 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[employer]    Employer: [employer]  Tobacco Use   Smoking status: Every Day    Current packs/day: 0.25    Average packs/day: 0.3 packs/day for 40.0 years (10.0 ttl pk-yrs)    Types: Cigarettes   Smokeless tobacco: Never  Vaping Use   Vaping status: Never Used  Substance and Sexual Activity   Alcohol use: Not Currently   Drug use: No   Sexual activity: Not Currently    Partners: Male    Birth control/protection: Post-menopausal    Comment: 1ST intercourse- 21, partners- 3  Other Topics Concern   Not on file  Social History Narrative   Not on file   Social Drivers of Health   Financial Resource Strain: Low Risk  (11/16/2023)   Overall Financial Resource Strain (CARDIA)    Difficulty of Paying Living Expenses: Not hard at all  Food Insecurity: No Food Insecurity (11/16/2023)   Hunger Vital Sign    Worried About Running Out of Food in the Last Year: Never true    Ran Out of Food in the Last Year: Never true  Transportation Needs: No Transportation Needs (11/16/2023)   PRAPARE - Administrator, Civil Service (Medical): No    Lack of  Transportation (Non-Medical): No  Physical Activity: Insufficiently Active (11/16/2023)   Exercise Vital Sign    Days of Exercise per Week: 1 day    Minutes of Exercise per Session: 10 min  Stress: No Stress Concern Present (11/16/2023)   [employer] of Occupational Health - Occupational Stress Questionnaire    Feeling of Stress : Only a little  Social Connections: Socially Integrated (11/16/2023)   Social Connection and Isolation Panel    Frequency of Communication with Friends and Family: Three times a week    Frequency of Social Gatherings with Friends and Family: Twice a week    Attends Religious Services: More than 4 times per year    Active Member of [employer] or Organizations: Yes    Attends Engineer, structural: More than 4 times per year    Marital Status: Married  Catering manager Violence: Not At Risk (09/19/2023)   Humiliation, Afraid, Rape, and Kick questionnaire    Fear of Current or Ex-Partner: No    Emotionally Abused: No    Physically Abused: No    Sexually Abused: No   Family History  Problem Relation Age of Onset   Breast cancer Mother    High blood pressure Mother    Kidney disease Mother    Sleep apnea Mother    Obesity Mother    CAD Father        CABG   Skin cancer Father    Lung cancer Father    Diverticulitis Father    Colon polyps Father    High blood pressure Father    Stroke Father    Heart disease Father    Sleep apnea Father    Allergies Brother    Diabetes Brother    Breast cancer Maternal Grandmother  Heart attack Maternal Grandfather    Heart attack Paternal Grandfather    COPD Other    Cancer Other    Heart disease Other    Stroke Other    Allergies  Allergen Reactions   Levofloxacin  In D5w Other (See Comments)    Achilles tendon weakness   Mango Flavoring Agent (Non-Screening) Swelling    fruit   Bupropion  Nausea And Vomiting      PE Today's Vitals   05/17/24 1623  BP: 122/62  Pulse: 72  Temp: 98.5 F (36.9  C)  TempSrc: Oral  SpO2: 96%  Weight: 170 lb (77.1 kg)  Height: 5' 0.25 (1.53 m)   Body mass index is 32.93 kg/m.  Physical Exam Vitals reviewed. Exam conducted with a chaperone present.  Constitutional:      General: She is not in acute distress.    Appearance: Normal appearance.  HENT:     Head: Normocephalic and atraumatic.     Nose: Nose normal.  Eyes:     Extraocular Movements: Extraocular movements intact.     Conjunctiva/sclera: Conjunctivae normal.  Neck:     Thyroid : No thyroid  mass, thyromegaly or thyroid  tenderness.  Pulmonary:     Effort: Pulmonary effort is normal.  Chest:     Chest wall: No mass or tenderness.  Breasts:    Right: Normal. No swelling, mass, nipple discharge, skin change or tenderness.     Left: Normal. No swelling, mass, nipple discharge, skin change or tenderness.  Abdominal:     General: There is no distension.     Palpations: Abdomen is soft.     Tenderness: There is no abdominal tenderness.     Hernia: A hernia (large left) is present.  Genitourinary:    General: Normal vulva.     Exam position: Lithotomy position.     Urethra: No prolapse.     Vagina: Normal. No vaginal discharge or bleeding.     Cervix: Normal. No lesion.     Uterus: Normal. Not enlarged and not tender.      Adnexa: Right adnexa normal and left adnexa normal.        Comments: Cervical polyp Musculoskeletal:        General: Normal range of motion.     Cervical back: Normal range of motion.  Lymphadenopathy:     Upper Body:     Right upper body: No axillary adenopathy.     Left upper body: No axillary adenopathy.     Lower Body: No right inguinal adenopathy. No left inguinal adenopathy.  Skin:    General: Skin is warm and dry.  Neurological:     General: No focal deficit present.     Mental Status: She is alert.  Psychiatric:        Mood and Affect: Mood normal.        Behavior: Behavior normal.      Assessment and Plan:        Encounter for breast  and pelvic examination Assessment & Plan: Cervical cancer screening performed according to ASCCP guidelines. Encouraged annual mammogram screening and MRI given increased 59yr risk of breast cancer Colonoscopy UTD DXA normal Labs and immunizations with her primary Encouraged safe sexual practices as indicated Encouraged healthy lifestyle practices with diet and exercise For patients under 50-70yo, I recommend 1200mg  calcium  daily and 600IU of vitamin D  daily.    Negative depression screening  Cervical cancer screening -     Cytology - PAP  Family history of breast cancer  Assessment & Plan: Neg genetic testing, however increased 73yr risk per Alisa model   Cervical polyp -     Colposcopy; Future  At risk for breast cancer -     MR BREAST BILATERAL W WO CONTRAST INC CAD; Future  Encounter for breast cancer screening using non-mammogram modality -     MR BREAST BILATERAL W WO CONTRAST INC CAD; Future   Vera LULLA Pa, MD

## 2024-05-17 NOTE — Patient Instructions (Signed)

## 2024-05-23 ENCOUNTER — Ambulatory Visit: Payer: Self-pay | Admitting: Obstetrics and Gynecology

## 2024-05-23 LAB — CYTOLOGY - PAP
Comment: NEGATIVE
Diagnosis: NEGATIVE
High risk HPV: NEGATIVE

## 2024-05-24 NOTE — Assessment & Plan Note (Signed)
 Cervical cancer screening performed according to ASCCP guidelines. Encouraged annual mammogram screening and MRI given increased 36yr risk of breast cancer Colonoscopy UTD DXA normal Labs and immunizations with her primary Encouraged safe sexual practices as indicated Encouraged healthy lifestyle practices with diet and exercise For patients under 50-67yo, I recommend 1200mg  calcium  daily and 600IU of vitamin D  daily.

## 2024-05-24 NOTE — Assessment & Plan Note (Signed)
 Neg genetic testing, however increased 36yr risk per Alisa model

## 2024-05-29 DIAGNOSIS — J449 Chronic obstructive pulmonary disease, unspecified: Secondary | ICD-10-CM | POA: Diagnosis not present

## 2024-05-29 DIAGNOSIS — F17210 Nicotine dependence, cigarettes, uncomplicated: Secondary | ICD-10-CM | POA: Diagnosis not present

## 2024-05-29 DIAGNOSIS — L929 Granulomatous disorder of the skin and subcutaneous tissue, unspecified: Secondary | ICD-10-CM | POA: Diagnosis not present

## 2024-05-29 DIAGNOSIS — Z79899 Other long term (current) drug therapy: Secondary | ICD-10-CM | POA: Diagnosis not present

## 2024-05-29 DIAGNOSIS — S31104A Unspecified open wound of abdominal wall, left lower quadrant without penetration into peritoneal cavity, initial encounter: Secondary | ICD-10-CM | POA: Diagnosis not present

## 2024-05-29 DIAGNOSIS — R7303 Prediabetes: Secondary | ICD-10-CM | POA: Diagnosis not present

## 2024-05-29 DIAGNOSIS — L98492 Non-pressure chronic ulcer of skin of other sites with fat layer exposed: Secondary | ICD-10-CM | POA: Diagnosis not present

## 2024-05-29 DIAGNOSIS — Z7984 Long term (current) use of oral hypoglycemic drugs: Secondary | ICD-10-CM | POA: Diagnosis not present

## 2024-06-05 DIAGNOSIS — H2513 Age-related nuclear cataract, bilateral: Secondary | ICD-10-CM | POA: Diagnosis not present

## 2024-06-05 DIAGNOSIS — H52203 Unspecified astigmatism, bilateral: Secondary | ICD-10-CM | POA: Diagnosis not present

## 2024-06-19 ENCOUNTER — Other Ambulatory Visit: Payer: Self-pay | Admitting: Family Medicine

## 2024-06-20 ENCOUNTER — Encounter: Payer: Self-pay | Admitting: Obstetrics and Gynecology

## 2024-06-20 ENCOUNTER — Ambulatory Visit (INDEPENDENT_AMBULATORY_CARE_PROVIDER_SITE_OTHER): Admitting: Obstetrics and Gynecology

## 2024-06-20 ENCOUNTER — Other Ambulatory Visit (HOSPITAL_COMMUNITY)
Admission: RE | Admit: 2024-06-20 | Discharge: 2024-06-20 | Disposition: A | Discharge status: Home / Self Care | Source: Ambulatory Visit | Attending: Obstetrics and Gynecology | Admitting: Obstetrics and Gynecology

## 2024-06-20 VITALS — BP 118/72 | HR 64 | Temp 97.8°F | Wt 168.0 lb

## 2024-06-20 DIAGNOSIS — N841 Polyp of cervix uteri: Secondary | ICD-10-CM

## 2024-06-20 DIAGNOSIS — Z01812 Encounter for preprocedural laboratory examination: Secondary | ICD-10-CM

## 2024-06-20 DIAGNOSIS — S31109A Unspecified open wound of abdominal wall, unspecified quadrant without penetration into peritoneal cavity, initial encounter: Secondary | ICD-10-CM | POA: Diagnosis not present

## 2024-06-20 LAB — PREGNANCY, URINE: Preg Test, Ur: NEGATIVE

## 2024-06-20 NOTE — Patient Instructions (Signed)
 It is common to have vaginal bleeding and cramping for up to 72 hours after your biopsy. Please call our office with heavy vaginal bleeding, severe abdominal pain or fever. Avoid intercourse, tampon use, douching and baths for 7 days to decrease the risk of infection.

## 2024-06-20 NOTE — Progress Notes (Signed)
 68 y.o. G2P1011 postmenopausal female with fam hx of breast cancer (09/2023 neg genetic testing, increased 16yr breast cancer risk), LSO (2016 with gen surg due to bowel perf), cervical polyp here for colposcopy. Married.  Patient's last menstrual period was 02/23/2012.   Last PAP:    Component Value Date/Time   DIAGPAP  05/17/2024 1629    - Negative for intraepithelial lesion or malignancy (NILM)   HPVHIGH Negative 05/17/2024 1629   ADEQPAP  05/17/2024 1629    Satisfactory for evaluation; transformation zone component PRESENT.   GYN HISTORY: AS noted  OB History  Gravida Para Term Preterm AB Living  2 1 1  1 1   SAB IAB Ectopic Multiple Live Births    1  1    # Outcome Date GA Lbr Len/2nd Weight Sex Type Anes PTL Lv  2 Ectopic           1 Term      CS-Unspec   LIV    Past Medical History:  Diagnosis Date   Anal fissure    Arthritis    Bursitis    Constipation    COPD (chronic obstructive pulmonary disease) (HCC)    Diverticulitis of colon 02/2015   Ectopic pregnancy    Emphysema of lung (HCC)    Frozen shoulder    Heart murmur    Hernia, abdominal    Hyperlipidemia    Hyperthyroidism    Infertility, female    Irregular heartbeat    Joint pain    Obesity    Osteoarthritis    Pneumonia    Pre-diabetes    Sleep apnea    Swallowing difficulty    Thyroid  disease    hyperthyroid    Past Surgical History:  Procedure Laterality Date   APPENDECTOMY     CESAREAN SECTION     COLONOSCOPY  05/27/2022   per Dr. Shila, no polyps, repeat 10 yrs   COLOSTOMY N/A 02/20/2015   Procedure: COLOSTOMY;  Surgeon: Krystal Russell, MD;  [facility]: [facility];  Service: General;  Laterality: N/A;   COLOSTOMY REVISION  02/20/2015   Procedure: Sigmoid Colectomy;  Surgeon: Krystal Russell, MD;  Location: [facility];  Service: General;;   CYSTOSCOPY N/A 02/20/2015   Procedure: PHYLLIS;  Surgeon: Mark Ottelin, MD;  Location: [facility];  Service: Urology;  Laterality: N/A;   DILATION  AND CURETTAGE OF UTERUS     x3   ECTOPIC PREGNANCY SURGERY     ILEO LOOP COLOSTOMY CLOSURE N/A 09/26/2015   Procedure: LAPAROSCOPIC LYSIS OF ADHESIONS, COLOSTOMY CLOSURE;  Surgeon: Krystal Russell, MD;  [facility];  Service: General;  Laterality: N/A;   LAPAROTOMY N/A 02/20/2015   Procedure: Emergency EXPLORATORY and drainiage of interabdominal abcesses;  Surgeon: Krystal Russell, MD;  [facility];  Service: General;  Laterality: N/A;   SALPINGOOPHORECTOMY Left 02/20/2015   Procedure: SALPINGO OOPHORECTOMY;  Surgeon: Krystal Russell, MD;  [facility];  Service: General;  Laterality: Left;   TONSILLECTOMY  1980   TOTAL HIP ARTHROPLASTY Left 02/23/2022   Procedure: TOTAL HIP ARTHROPLASTY ANTERIOR APPROACH;  Surgeon: Ernie Cough, MD;  [facility]: [facility];  Service: Orthopedics;  Laterality: Left;    Current Outpatient Medications on File Prior to Visit  Medication Sig Dispense Refill   Accu-Chek Softclix Lancets lancets Use to check blood sugar 1 x a day 100 each 3   acetaZOLAMIDE  ER (DIAMOX ) 500 MG capsule Take 1 capsule (500 mg total) by mouth 2 (two) times daily. When travelling at high altitudes  60 capsule 2   atorvastatin  (LIPITOR) 20 MG tablet TAKE 1 TABLET(20 MG) BY MOUTH DAILY 90 tablet 2   betamethasone  dipropionate 0.05 % cream Apply 1 application  topically 2 (two) times daily as needed (eczema). 45 g 5   Blood Glucose Monitoring Suppl (ACCU-CHEK GUIDE) w/Device KIT Use to check blood sugar 1 x a day 1 kit 0   Celecoxib  (CELEBREX  PO) Take by mouth daily.     Coenzyme Q10 (CO Q 10 PO) Take 200 mg by mouth daily.     diltiazem  (CARDIZEM  CD) 120 MG 24 hr capsule TAKE 1 CAPSULE(120 MG) BY MOUTH DAILY 90 capsule 3   EPINEPHrine  0.3 mg/0.3 mL IJ SOAJ injection Inject 0.3 mg into the muscle as needed for anaphylaxis. 1 each 2   fenofibrate  (TRICOR ) 145 MG tablet 1 tablet Orally Once a day; Duration: 30 day(s)     fenofibrate  micronized (LOFIBRA) 134 MG capsule TAKE 1  CAPSULE(134 MG) BY MOUTH DAILY 90 capsule 1   furosemide  (LASIX ) 20 MG tablet TAKE 1 TABLET(20 MG) BY MOUTH DAILY AS NEEDED FOR FLUID RETENTION OR SWELLING 30 tablet 2   glucose blood (ACCU-CHEK GUIDE TEST) test strip Use to check blood sugar 1 x a day 100 each 12   MAGNESIUM  PO Take by mouth.     Multiple Vitamins-Minerals (MULTIVITAMIN/EXTRA VITAMIN D3 PO) Take 1 tablet by mouth daily.     mupirocin  ointment (BACTROBAN ) 2 % Apply inside both nostrils twice a day 30 g 5   nicotine  (NICODERM CQ  - DOSED IN MG/24 HOURS) 14 mg/24hr patch RX #2 Weeks 5-6: 14 mg x 1 patch daily. Wear for 24 hours. If you have sleep disturbances, remove at bedtime. 42 each 0   nicotine  (NICODERM CQ  - DOSED IN MG/24 HOURS) 21 mg/24hr patch RX #1 Weeks 1-4: 21 mg x 1 patch daily. Wear for 24 hours. If you have sleep disturbances, remove at bedtime. 28 patch 0   nicotine  (NICODERM CQ  - DOSED IN MG/24 HR) 7 mg/24hr patch RX #3 Weeks 7-8: 7 mg x 1 patch daily. Wear for 24 hours. If you have sleep disturbances, remove at bedtime. 14 patch 0   OVER THE COUNTER MEDICATION Take 1 tablet by mouth daily. Saffron     OVER THE COUNTER MEDICATION Take 2 capsules by mouth daily. Memory and Brain     polyethylene glycol (MIRALAX  / GLYCOLAX ) 17 g packet Take 17 g by mouth daily as needed for mild constipation. 14 each 0   tirzepatide  (ZEPBOUND ) 15 MG/0.5ML Pen Inject 15 mg into the skin once a week. 4 mL 3   UNABLE TO FIND Med Name: vegetable blend     COLLAGEN PO Take 15 mLs by mouth daily. Liquid (Patient not taking: Reported on 06/20/2024)     OVER THE COUNTER MEDICATION Take 1 capsule by mouth daily. Cruciferous extract (Patient not taking: Reported on 06/20/2024)     OVER THE COUNTER MEDICATION Take 1 capsule by mouth daily. fruit full antioxidant (Patient not taking: Reported on 06/20/2024)     No current facility-administered medications on file prior to visit.    Allergies  Allergen Reactions   Levofloxacin  In D5w Other (See  Comments)    Achilles tendon weakness   Mango Flavoring Agent (Non-Screening) Swelling    fruit   Bupropion  Nausea And Vomiting    PE Today's Vitals   06/20/24 0831  BP: 118/72  Pulse: 64  Temp: 97.8 F (36.6 C)  TempSrc: Oral  SpO2: 97%  Weight: 168 lb (76.2 kg)   Body mass index is 32.54 kg/m.  Physical Exam Vitals reviewed. Exam conducted with a chaperone present.  Constitutional:      General: She is not in acute distress.    Appearance: Normal appearance.  HENT:     Head: Normocephalic and atraumatic.     Nose: Nose normal.  Eyes:     Extraocular Movements: Extraocular movements intact.     Conjunctiva/sclera: Conjunctivae normal.  Pulmonary:     Effort: Pulmonary effort is normal.  Genitourinary:    General: Normal vulva.     Exam position: Lithotomy position.     Vagina: Normal. No vaginal discharge.     Cervix: Normal. No cervical motion tenderness, discharge or lesion.     Uterus: Normal. Not enlarged and not tender.      Adnexa: Right adnexa normal and left adnexa normal.     Comments: Cervical polyp as previosuly noted Musculoskeletal:        General: Normal range of motion.     Cervical back: Normal range of motion.  Neurological:     General: No focal deficit present.     Mental Status: She is alert.  Psychiatric:        Mood and Affect: Mood normal.        Behavior: Behavior normal.     Colposcopy Procedure Consented for procedure.  Time out performed. Speculum placed in vagina.  Acetic acid 3% was applied to cervix.  Unsatisfactory colposcopy- TZ was not seen. Hurricaine anesthetic spray was applied. Cervical polyp was removed with forceps. Findings: Otherwise normal No additional biopsies were taken.   Good hemostasis.  Minimal EBL. No complications.  Tolerated well.     Assessment and Plan:        Pre-procedure lab exam -     Pregnancy, urine  Cervical polyp -     Colposcopy  Recent normal PAP, however cervical polyp  present. Uncomplicated cervical polypectomy with normal colposcopy Aftercare instructions provided.  Will call with results.  Vera LULLA Pa, MD

## 2024-06-22 ENCOUNTER — Ambulatory Visit: Payer: Self-pay | Admitting: Obstetrics and Gynecology

## 2024-06-22 ENCOUNTER — Encounter: Payer: Self-pay | Admitting: Family Medicine

## 2024-06-22 LAB — SURGICAL PATHOLOGY

## 2024-06-25 ENCOUNTER — Encounter: Payer: Self-pay | Admitting: Family Medicine

## 2024-06-25 DIAGNOSIS — Z23 Encounter for immunization: Secondary | ICD-10-CM

## 2024-06-26 MED ORDER — CELECOXIB 100 MG PO CAPS: 100.0000 mg | ORAL_CAPSULE | Freq: Two times a day (BID) | ORAL | 5 refills | Status: AC

## 2024-06-26 NOTE — Telephone Encounter (Signed)
 I sent in a RX for the 100 mg dose

## 2024-06-26 NOTE — Telephone Encounter (Signed)
 Please call in a RX for the Covid vaccine

## 2024-06-27 ENCOUNTER — Other Ambulatory Visit: Payer: Self-pay

## 2024-06-27 MED ORDER — COVID-19 MRNA VACC (MODERNA) 50 MCG/0.5ML IM SUSP: 0.5000 mL | Freq: Once | INTRAMUSCULAR | 0 refills | Status: AC

## 2024-06-27 MED ORDER — COVID-19 AD26 VACCINE(JANSSEN) 0.5 ML IM SUSP: 0.5000 mL | Freq: Once | INTRAMUSCULAR | 0 refills | Status: DC

## 2024-06-27 NOTE — Addendum Note (Signed)
 Addended by: DIONISIO CAMELIA PARAS on: 06/27/2024 08:09 PM   Modules accepted: Orders

## 2024-06-28 DIAGNOSIS — S31104A Unspecified open wound of abdominal wall, left lower quadrant without penetration into peritoneal cavity, initial encounter: Secondary | ICD-10-CM | POA: Diagnosis not present

## 2024-06-28 DIAGNOSIS — Z79899 Other long term (current) drug therapy: Secondary | ICD-10-CM | POA: Diagnosis not present

## 2024-06-28 DIAGNOSIS — L989 Disorder of the skin and subcutaneous tissue, unspecified: Secondary | ICD-10-CM | POA: Diagnosis not present

## 2024-06-28 DIAGNOSIS — E785 Hyperlipidemia, unspecified: Secondary | ICD-10-CM | POA: Diagnosis not present

## 2024-06-28 DIAGNOSIS — J449 Chronic obstructive pulmonary disease, unspecified: Secondary | ICD-10-CM | POA: Diagnosis not present

## 2024-06-28 DIAGNOSIS — Z7984 Long term (current) use of oral hypoglycemic drugs: Secondary | ICD-10-CM | POA: Diagnosis not present

## 2024-06-28 DIAGNOSIS — F17210 Nicotine dependence, cigarettes, uncomplicated: Secondary | ICD-10-CM | POA: Diagnosis not present

## 2024-06-28 DIAGNOSIS — L98492 Non-pressure chronic ulcer of skin of other sites with fat layer exposed: Secondary | ICD-10-CM | POA: Diagnosis not present

## 2024-07-02 DIAGNOSIS — Z23 Encounter for immunization: Secondary | ICD-10-CM | POA: Diagnosis not present

## 2024-07-24 DIAGNOSIS — F17210 Nicotine dependence, cigarettes, uncomplicated: Secondary | ICD-10-CM | POA: Diagnosis not present

## 2024-07-24 DIAGNOSIS — L989 Disorder of the skin and subcutaneous tissue, unspecified: Secondary | ICD-10-CM | POA: Diagnosis not present

## 2024-07-24 DIAGNOSIS — S31104A Unspecified open wound of abdominal wall, left lower quadrant without penetration into peritoneal cavity, initial encounter: Secondary | ICD-10-CM | POA: Diagnosis not present

## 2024-07-24 DIAGNOSIS — S31109A Unspecified open wound of abdominal wall, unspecified quadrant without penetration into peritoneal cavity, initial encounter: Secondary | ICD-10-CM | POA: Diagnosis not present

## 2024-07-24 DIAGNOSIS — E785 Hyperlipidemia, unspecified: Secondary | ICD-10-CM | POA: Diagnosis not present

## 2024-07-24 DIAGNOSIS — Z7984 Long term (current) use of oral hypoglycemic drugs: Secondary | ICD-10-CM | POA: Diagnosis not present

## 2024-07-24 DIAGNOSIS — L98492 Non-pressure chronic ulcer of skin of other sites with fat layer exposed: Secondary | ICD-10-CM | POA: Diagnosis not present

## 2024-07-24 DIAGNOSIS — J449 Chronic obstructive pulmonary disease, unspecified: Secondary | ICD-10-CM | POA: Diagnosis not present

## 2024-07-24 DIAGNOSIS — Z79899 Other long term (current) drug therapy: Secondary | ICD-10-CM | POA: Diagnosis not present

## 2024-08-20 DIAGNOSIS — S31109D Unspecified open wound of abdominal wall, unspecified quadrant without penetration into peritoneal cavity, subsequent encounter: Secondary | ICD-10-CM | POA: Diagnosis not present

## 2024-08-20 DIAGNOSIS — L98432 Non-pressure chronic ulcer of abdomen with fat layer exposed: Secondary | ICD-10-CM | POA: Diagnosis not present

## 2024-08-23 DIAGNOSIS — S31109A Unspecified open wound of abdominal wall, unspecified quadrant without penetration into peritoneal cavity, initial encounter: Secondary | ICD-10-CM | POA: Diagnosis not present

## 2024-08-28 ENCOUNTER — Other Ambulatory Visit: Payer: Self-pay | Admitting: Family Medicine

## 2024-08-28 ENCOUNTER — Telehealth: Payer: Self-pay | Admitting: Family Medicine

## 2024-08-28 NOTE — Telephone Encounter (Signed)
 Copied from CRM [id number]. Topic: Clinical - Medical Advice >> Aug 28, 2024 11:12 AM [name] wrote: Reason for CRM: pt called because she is out of refills for tirzepatide  (ZEPBOUND ) 15 MG/0.5ML Pen and wanted to know if she able to take the leftover doses 12.5 and 2.5. she has not took a shot today. Call back number [phone]

## 2024-08-29 ENCOUNTER — Other Ambulatory Visit: Payer: Self-pay

## 2024-08-29 MED ORDER — ZEPBOUND 15 MG/0.5ML ~~LOC~~ SOAJ: 15.0000 mg | SUBCUTANEOUS | 3 refills | Status: AC

## 2024-08-29 NOTE — Telephone Encounter (Signed)
 Reviewed Dr Johnny advise with pt voiced understanding

## 2024-08-29 NOTE — Telephone Encounter (Signed)
 Yes she could take a 12.5 mg dose and a 2.5 mg dose together

## 2024-09-17 DIAGNOSIS — L98432 Non-pressure chronic ulcer of abdomen with fat layer exposed: Secondary | ICD-10-CM | POA: Diagnosis not present

## 2024-09-17 DIAGNOSIS — K469 Unspecified abdominal hernia without obstruction or gangrene: Secondary | ICD-10-CM | POA: Diagnosis not present

## 2024-10-08 ENCOUNTER — Encounter: Payer: Self-pay | Admitting: Obstetrics and Gynecology

## 2024-10-08 DIAGNOSIS — F419 Anxiety disorder, unspecified: Secondary | ICD-10-CM

## 2024-10-08 MED ORDER — DIAZEPAM 5 MG PO TABS: 5.0000 mg | ORAL_TABLET | Freq: Once | ORAL | 0 refills | Status: AC

## 2024-10-20 ENCOUNTER — Other Ambulatory Visit: Payer: Self-pay | Admitting: Family Medicine

## 2024-10-27 ENCOUNTER — Ambulatory Visit
Admission: RE | Admit: 2024-10-27 | Discharge: 2024-10-27 | Disposition: A | Source: Ambulatory Visit | Attending: Obstetrics and Gynecology

## 2024-10-27 DIAGNOSIS — Z1239 Encounter for other screening for malignant neoplasm of breast: Secondary | ICD-10-CM

## 2024-10-27 DIAGNOSIS — Z9189 Other specified personal risk factors, not elsewhere classified: Secondary | ICD-10-CM

## 2024-10-27 MED ORDER — GADOPICLENOL 0.5 MMOL/ML IV SOLN
8.0000 mL | Freq: Once | INTRAVENOUS | Status: AC | PRN
  Administered 2024-10-27: 8 mL via INTRAVENOUS

## 2024-11-01 ENCOUNTER — Encounter: Payer: Self-pay | Admitting: Internal Medicine

## 2024-11-01 MED ORDER — METFORMIN HCL 500 MG PO TABS: 500.0000 mg | ORAL_TABLET | Freq: Two times a day (BID) | ORAL | 3 refills | Status: AC

## 2024-11-01 NOTE — Addendum Note (Signed)
 Addended by: CLEOTILDE ROLIN RAMAN on: 11/01/2024 01:42 PM   Modules accepted: Orders

## 2025-01-18 ENCOUNTER — Ambulatory Visit

## 2025-02-11 ENCOUNTER — Ambulatory Visit: Admitting: Internal Medicine

## 2025-05-06 ENCOUNTER — Encounter: Admitting: Family Medicine
